# Patient Record
Sex: Female | Born: 1935 | Race: Black or African American | Hispanic: No | Marital: Single | Smoking: Never smoker
Health system: Southern US, Community
[De-identification: ages and names within clinical notes are randomized; demographics above are authoritative.]

## PROBLEM LIST (undated history)

## (undated) DIAGNOSIS — S43499A Other sprain of unspecified shoulder joint, initial encounter: Secondary | ICD-10-CM

## (undated) DIAGNOSIS — J309 Allergic rhinitis, unspecified: Secondary | ICD-10-CM

## (undated) DIAGNOSIS — S46819A Strain of other muscles, fascia and tendons at shoulder and upper arm level, unspecified arm, initial encounter: Secondary | ICD-10-CM

## (undated) DIAGNOSIS — R7309 Other abnormal glucose: Secondary | ICD-10-CM

## (undated) DIAGNOSIS — N812 Incomplete uterovaginal prolapse: Secondary | ICD-10-CM

## (undated) DIAGNOSIS — R609 Edema, unspecified: Secondary | ICD-10-CM

## (undated) DIAGNOSIS — M1710 Unilateral primary osteoarthritis, unspecified knee: Secondary | ICD-10-CM

## (undated) DIAGNOSIS — E7800 Pure hypercholesterolemia, unspecified: Secondary | ICD-10-CM

## (undated) DIAGNOSIS — M79609 Pain in unspecified limb: Secondary | ICD-10-CM

## (undated) DIAGNOSIS — T7840XA Allergy, unspecified, initial encounter: Secondary | ICD-10-CM

## (undated) DIAGNOSIS — E119 Type 2 diabetes mellitus without complications: Secondary | ICD-10-CM

## (undated) DIAGNOSIS — R5383 Other fatigue: Secondary | ICD-10-CM

## (undated) DIAGNOSIS — D010 Carcinoma in situ of colon: Secondary | ICD-10-CM

## (undated) DIAGNOSIS — R5381 Other malaise: Secondary | ICD-10-CM

## (undated) DIAGNOSIS — IMO0002 Reserved for concepts with insufficient information to code with codable children: Secondary | ICD-10-CM

## (undated) DIAGNOSIS — E669 Obesity, unspecified: Secondary | ICD-10-CM

## (undated) DIAGNOSIS — R109 Unspecified abdominal pain: Secondary | ICD-10-CM

## (undated) DIAGNOSIS — K222 Esophageal obstruction: Secondary | ICD-10-CM

## (undated) DIAGNOSIS — M25559 Pain in unspecified hip: Secondary | ICD-10-CM

## (undated) DIAGNOSIS — F411 Generalized anxiety disorder: Secondary | ICD-10-CM

## (undated) DIAGNOSIS — R3129 Other microscopic hematuria: Secondary | ICD-10-CM

## (undated) DIAGNOSIS — G609 Hereditary and idiopathic neuropathy, unspecified: Secondary | ICD-10-CM

## (undated) DIAGNOSIS — R51 Headache: Secondary | ICD-10-CM

## (undated) DIAGNOSIS — R1310 Dysphagia, unspecified: Secondary | ICD-10-CM

## (undated) DIAGNOSIS — M810 Age-related osteoporosis without current pathological fracture: Secondary | ICD-10-CM

## (undated) DIAGNOSIS — K5900 Constipation, unspecified: Secondary | ICD-10-CM

## (undated) DIAGNOSIS — H8110 Benign paroxysmal vertigo, unspecified ear: Secondary | ICD-10-CM

## (undated) DIAGNOSIS — I10 Essential (primary) hypertension: Secondary | ICD-10-CM

## (undated) DIAGNOSIS — J329 Chronic sinusitis, unspecified: Secondary | ICD-10-CM

## (undated) HISTORY — DX: Hereditary and idiopathic neuropathy, unspecified: G60.9

## (undated) HISTORY — DX: Dysphagia, unspecified: R13.10

## (undated) HISTORY — DX: Pain in unspecified hip: M25.559

## (undated) HISTORY — DX: Allergic rhinitis, unspecified: J30.9

## (undated) HISTORY — DX: Type 2 diabetes mellitus without complications: E11.9

## (undated) HISTORY — DX: Essential (primary) hypertension: I10

## (undated) HISTORY — DX: Unilateral primary osteoarthritis, unspecified knee: M17.10

## (undated) HISTORY — DX: Incomplete uterovaginal prolapse: N81.2

## (undated) HISTORY — PX: COLONOSCOPY: SHX174

## (undated) HISTORY — DX: Strain of other muscles, fascia and tendons at shoulder and upper arm level, unspecified arm, initial encounter: S46.819A

## (undated) HISTORY — DX: Generalized anxiety disorder: F41.1

## (undated) HISTORY — DX: Reserved for concepts with insufficient information to code with codable children: IMO0002

## (undated) HISTORY — DX: Carcinoma in situ of colon: D01.0

## (undated) HISTORY — DX: Headache: R51

## (undated) HISTORY — DX: Other malaise: R53.81

## (undated) HISTORY — DX: Unspecified abdominal pain: R10.9

## (undated) HISTORY — DX: Edema, unspecified: R60.9

## (undated) HISTORY — DX: Other sprain of unspecified shoulder joint, initial encounter: S43.499A

## (undated) HISTORY — DX: Age-related osteoporosis without current pathological fracture: M81.0

## (undated) HISTORY — DX: Pure hypercholesterolemia, unspecified: E78.00

## (undated) HISTORY — DX: Constipation, unspecified: K59.00

## (undated) HISTORY — DX: Benign paroxysmal vertigo, unspecified ear: H81.10

## (undated) HISTORY — DX: Other fatigue: R53.83

## (undated) HISTORY — DX: Other microscopic hematuria: R31.29

## (undated) HISTORY — DX: Pain in unspecified limb: M79.609

## (undated) HISTORY — DX: Other abnormal glucose: R73.09

## (undated) HISTORY — DX: Chronic sinusitis, unspecified: J32.9

## (undated) HISTORY — DX: Esophageal obstruction: K22.2

## (undated) HISTORY — DX: Allergy, unspecified, initial encounter: T78.40XA

## (undated) HISTORY — DX: Obesity, unspecified: E66.9

---

## 1999-06-24 ENCOUNTER — Encounter: Admission: RE | Admit: 1999-06-24 | Discharge: 1999-06-24 | Payer: Self-pay | Admitting: Periodontics

## 1999-06-24 ENCOUNTER — Encounter: Payer: Self-pay | Admitting: Unknown Physician Specialty

## 2000-08-10 ENCOUNTER — Encounter: Admission: RE | Admit: 2000-08-10 | Discharge: 2000-08-10 | Payer: Self-pay | Admitting: Unknown Physician Specialty

## 2000-08-10 ENCOUNTER — Encounter: Payer: Self-pay | Admitting: Unknown Physician Specialty

## 2002-11-03 ENCOUNTER — Encounter: Admission: RE | Admit: 2002-11-03 | Discharge: 2002-11-03 | Payer: Self-pay | Admitting: Unknown Physician Specialty

## 2002-11-03 ENCOUNTER — Encounter: Payer: Self-pay | Admitting: Unknown Physician Specialty

## 2003-11-14 ENCOUNTER — Encounter: Admission: RE | Admit: 2003-11-14 | Discharge: 2003-11-14 | Payer: Self-pay | Admitting: Unknown Physician Specialty

## 2004-11-14 ENCOUNTER — Encounter: Admission: RE | Admit: 2004-11-14 | Discharge: 2004-11-14 | Payer: Self-pay | Admitting: Unknown Physician Specialty

## 2005-07-22 ENCOUNTER — Emergency Department (HOSPITAL_COMMUNITY): Admission: EM | Admit: 2005-07-22 | Discharge: 2005-07-22 | Payer: Self-pay | Admitting: Family Medicine

## 2005-11-17 ENCOUNTER — Encounter: Admission: RE | Admit: 2005-11-17 | Discharge: 2005-11-17 | Payer: Self-pay | Admitting: Unknown Physician Specialty

## 2005-12-13 ENCOUNTER — Emergency Department (HOSPITAL_COMMUNITY): Admission: EM | Admit: 2005-12-13 | Discharge: 2005-12-13 | Payer: Self-pay | Admitting: Family Medicine

## 2006-01-10 ENCOUNTER — Emergency Department (HOSPITAL_COMMUNITY): Admission: EM | Admit: 2006-01-10 | Discharge: 2006-01-10 | Payer: Self-pay | Admitting: Family Medicine

## 2006-11-22 ENCOUNTER — Encounter: Admission: RE | Admit: 2006-11-22 | Discharge: 2006-11-22 | Payer: Self-pay | Admitting: Unknown Physician Specialty

## 2007-02-17 ENCOUNTER — Emergency Department (HOSPITAL_COMMUNITY): Admission: EM | Admit: 2007-02-17 | Discharge: 2007-02-17 | Payer: Self-pay | Admitting: Family Medicine

## 2007-03-03 ENCOUNTER — Ambulatory Visit: Payer: Self-pay | Admitting: Nurse Practitioner

## 2007-03-03 DIAGNOSIS — M25559 Pain in unspecified hip: Secondary | ICD-10-CM | POA: Insufficient documentation

## 2007-03-03 DIAGNOSIS — M810 Age-related osteoporosis without current pathological fracture: Secondary | ICD-10-CM

## 2007-03-03 DIAGNOSIS — E1169 Type 2 diabetes mellitus with other specified complication: Secondary | ICD-10-CM | POA: Insufficient documentation

## 2007-03-03 DIAGNOSIS — I10 Essential (primary) hypertension: Secondary | ICD-10-CM

## 2007-03-03 DIAGNOSIS — J309 Allergic rhinitis, unspecified: Secondary | ICD-10-CM

## 2007-03-03 DIAGNOSIS — F411 Generalized anxiety disorder: Secondary | ICD-10-CM | POA: Insufficient documentation

## 2007-03-03 DIAGNOSIS — E7800 Pure hypercholesterolemia, unspecified: Secondary | ICD-10-CM

## 2007-03-03 DIAGNOSIS — I152 Hypertension secondary to endocrine disorders: Secondary | ICD-10-CM | POA: Insufficient documentation

## 2007-03-03 HISTORY — DX: Essential (primary) hypertension: I10

## 2007-03-03 LAB — CONVERTED CEMR LAB
Cholesterol, target level: 200 mg/dL
HDL goal, serum: 40 mg/dL
LDL Goal: 130 mg/dL

## 2007-03-04 LAB — CONVERTED CEMR LAB
ALT: 30 units/L (ref 0–35)
AST: 30 units/L (ref 0–37)
Albumin: 4.6 g/dL (ref 3.5–5.2)
Basophils Absolute: 0.1 10*3/uL (ref 0.0–0.1)
Basophils Relative: 1 % (ref 0–1)
Calcium: 10.3 mg/dL (ref 8.4–10.5)
Chloride: 101 meq/L (ref 96–112)
Lymphocytes Relative: 45 % (ref 12–46)
MCHC: 32.8 g/dL (ref 30.0–36.0)
Neutro Abs: 3 10*3/uL (ref 1.7–7.7)
Neutrophils Relative %: 47 % (ref 43–77)
Potassium: 3.7 meq/L (ref 3.5–5.3)
RDW: 14.8 % (ref 11.5–15.5)
Sodium: 140 meq/L (ref 135–145)
TSH: 1.425 microintl units/mL (ref 0.350–5.50)
Total CHOL/HDL Ratio: 2.3
Total Protein: 7.9 g/dL (ref 6.0–8.3)

## 2007-03-07 ENCOUNTER — Encounter (INDEPENDENT_AMBULATORY_CARE_PROVIDER_SITE_OTHER): Payer: Self-pay | Admitting: Nurse Practitioner

## 2007-03-10 ENCOUNTER — Telehealth (INDEPENDENT_AMBULATORY_CARE_PROVIDER_SITE_OTHER): Payer: Self-pay | Admitting: Nurse Practitioner

## 2007-03-24 ENCOUNTER — Other Ambulatory Visit: Admission: RE | Admit: 2007-03-24 | Discharge: 2007-03-24 | Payer: Self-pay | Admitting: Internal Medicine

## 2007-03-24 ENCOUNTER — Ambulatory Visit: Payer: Self-pay | Admitting: Nurse Practitioner

## 2007-03-24 DIAGNOSIS — R519 Headache, unspecified: Secondary | ICD-10-CM | POA: Insufficient documentation

## 2007-03-24 DIAGNOSIS — R51 Headache: Secondary | ICD-10-CM

## 2007-03-24 LAB — CONVERTED CEMR LAB: Pap Smear: NORMAL

## 2007-03-25 ENCOUNTER — Encounter (INDEPENDENT_AMBULATORY_CARE_PROVIDER_SITE_OTHER): Payer: Self-pay | Admitting: Nurse Practitioner

## 2007-03-30 ENCOUNTER — Encounter (INDEPENDENT_AMBULATORY_CARE_PROVIDER_SITE_OTHER): Payer: Self-pay | Admitting: Nurse Practitioner

## 2007-04-12 ENCOUNTER — Encounter (INDEPENDENT_AMBULATORY_CARE_PROVIDER_SITE_OTHER): Payer: Self-pay | Admitting: Nurse Practitioner

## 2007-04-25 ENCOUNTER — Ambulatory Visit: Payer: Self-pay | Admitting: Nurse Practitioner

## 2007-04-25 DIAGNOSIS — H8110 Benign paroxysmal vertigo, unspecified ear: Secondary | ICD-10-CM

## 2007-05-02 ENCOUNTER — Encounter (INDEPENDENT_AMBULATORY_CARE_PROVIDER_SITE_OTHER): Payer: Self-pay | Admitting: Nurse Practitioner

## 2007-05-04 ENCOUNTER — Encounter (INDEPENDENT_AMBULATORY_CARE_PROVIDER_SITE_OTHER): Payer: Self-pay | Admitting: Nurse Practitioner

## 2007-06-08 ENCOUNTER — Ambulatory Visit: Payer: Self-pay | Admitting: Nurse Practitioner

## 2007-06-08 DIAGNOSIS — S46819A Strain of other muscles, fascia and tendons at shoulder and upper arm level, unspecified arm, initial encounter: Secondary | ICD-10-CM

## 2007-06-08 DIAGNOSIS — R109 Unspecified abdominal pain: Secondary | ICD-10-CM | POA: Insufficient documentation

## 2007-06-08 DIAGNOSIS — S43499A Other sprain of unspecified shoulder joint, initial encounter: Secondary | ICD-10-CM | POA: Insufficient documentation

## 2007-06-08 LAB — CONVERTED CEMR LAB
Nitrite: NEGATIVE
Protein, U semiquant: NEGATIVE
Urobilinogen, UA: 1
pH: 7

## 2007-06-09 ENCOUNTER — Encounter (INDEPENDENT_AMBULATORY_CARE_PROVIDER_SITE_OTHER): Payer: Self-pay | Admitting: Nurse Practitioner

## 2007-06-23 ENCOUNTER — Telehealth (INDEPENDENT_AMBULATORY_CARE_PROVIDER_SITE_OTHER): Payer: Self-pay | Admitting: Nurse Practitioner

## 2007-08-16 ENCOUNTER — Encounter (INDEPENDENT_AMBULATORY_CARE_PROVIDER_SITE_OTHER): Payer: Self-pay | Admitting: Nurse Practitioner

## 2007-08-31 ENCOUNTER — Ambulatory Visit: Payer: Self-pay | Admitting: Nurse Practitioner

## 2007-08-31 DIAGNOSIS — R609 Edema, unspecified: Secondary | ICD-10-CM | POA: Insufficient documentation

## 2007-09-01 ENCOUNTER — Telehealth (INDEPENDENT_AMBULATORY_CARE_PROVIDER_SITE_OTHER): Payer: Self-pay | Admitting: Nurse Practitioner

## 2007-09-15 ENCOUNTER — Encounter (INDEPENDENT_AMBULATORY_CARE_PROVIDER_SITE_OTHER): Payer: Self-pay | Admitting: Internal Medicine

## 2007-09-28 ENCOUNTER — Ambulatory Visit: Payer: Self-pay | Admitting: Nurse Practitioner

## 2007-09-28 DIAGNOSIS — IMO0002 Reserved for concepts with insufficient information to code with codable children: Secondary | ICD-10-CM | POA: Insufficient documentation

## 2007-09-28 DIAGNOSIS — M1710 Unilateral primary osteoarthritis, unspecified knee: Secondary | ICD-10-CM

## 2007-11-21 ENCOUNTER — Ambulatory Visit: Payer: Self-pay | Admitting: Nurse Practitioner

## 2007-11-21 LAB — CONVERTED CEMR LAB
AST: 21 units/L (ref 0–37)
Albumin: 4.8 g/dL (ref 3.5–5.2)
Alkaline Phosphatase: 93 units/L (ref 39–117)
Basophils Absolute: 0.1 10*3/uL (ref 0.0–0.1)
Basophils Relative: 1 % (ref 0–1)
Eosinophils Absolute: 0.2 10*3/uL (ref 0.0–0.7)
LDL Cholesterol: 83 mg/dL (ref 0–99)
MCHC: 33.2 g/dL (ref 30.0–36.0)
MCV: 84.4 fL (ref 78.0–100.0)
Neutrophils Relative %: 51 % (ref 43–77)
Platelets: 345 10*3/uL (ref 150–400)
Potassium: 3.6 meq/L (ref 3.5–5.3)
Sodium: 136 meq/L (ref 135–145)
Total Protein: 8.2 g/dL (ref 6.0–8.3)
WBC: 7.9 10*3/uL (ref 4.0–10.5)

## 2007-11-22 ENCOUNTER — Encounter (INDEPENDENT_AMBULATORY_CARE_PROVIDER_SITE_OTHER): Payer: Self-pay | Admitting: Nurse Practitioner

## 2007-12-05 ENCOUNTER — Encounter: Admission: RE | Admit: 2007-12-05 | Discharge: 2007-12-05 | Payer: Self-pay | Admitting: Internal Medicine

## 2008-01-09 ENCOUNTER — Telehealth (INDEPENDENT_AMBULATORY_CARE_PROVIDER_SITE_OTHER): Payer: Self-pay | Admitting: Nurse Practitioner

## 2008-01-10 ENCOUNTER — Ambulatory Visit: Payer: Self-pay | Admitting: Nurse Practitioner

## 2008-02-23 ENCOUNTER — Ambulatory Visit: Payer: Self-pay | Admitting: Nurse Practitioner

## 2008-02-23 DIAGNOSIS — J329 Chronic sinusitis, unspecified: Secondary | ICD-10-CM

## 2008-02-27 ENCOUNTER — Ambulatory Visit (HOSPITAL_COMMUNITY): Admission: RE | Admit: 2008-02-27 | Discharge: 2008-02-27 | Payer: Self-pay | Admitting: Internal Medicine

## 2008-02-27 ENCOUNTER — Telehealth (INDEPENDENT_AMBULATORY_CARE_PROVIDER_SITE_OTHER): Payer: Self-pay | Admitting: Nurse Practitioner

## 2008-05-23 ENCOUNTER — Ambulatory Visit: Payer: Self-pay | Admitting: Nurse Practitioner

## 2008-05-23 LAB — CONVERTED CEMR LAB: LDL Goal: 160 mg/dL

## 2008-06-04 ENCOUNTER — Telehealth (INDEPENDENT_AMBULATORY_CARE_PROVIDER_SITE_OTHER): Payer: Self-pay | Admitting: Nurse Practitioner

## 2008-06-22 ENCOUNTER — Encounter (INDEPENDENT_AMBULATORY_CARE_PROVIDER_SITE_OTHER): Payer: Self-pay | Admitting: Nurse Practitioner

## 2008-09-25 ENCOUNTER — Ambulatory Visit: Payer: Self-pay | Admitting: Nurse Practitioner

## 2008-09-25 DIAGNOSIS — K5900 Constipation, unspecified: Secondary | ICD-10-CM | POA: Insufficient documentation

## 2008-09-25 LAB — CONVERTED CEMR LAB
ALT: 16 units/L (ref 0–35)
AST: 18 units/L (ref 0–37)
Albumin: 4.4 g/dL (ref 3.5–5.2)
Alkaline Phosphatase: 85 units/L (ref 39–117)
Basophils Absolute: 0.1 10*3/uL (ref 0.0–0.1)
Glucose, Bld: 104 mg/dL — ABNORMAL HIGH (ref 70–99)
Hemoglobin: 12.2 g/dL (ref 12.0–15.0)
LDL Cholesterol: 75 mg/dL (ref 0–99)
Lymphocytes Relative: 37 % (ref 12–46)
Microalb, Ur: 1.12 mg/dL (ref 0.00–1.89)
Monocytes Absolute: 0.6 10*3/uL (ref 0.1–1.0)
Monocytes Relative: 7 % (ref 3–12)
Neutro Abs: 4.6 10*3/uL (ref 1.7–7.7)
Neutrophils Relative %: 54 % (ref 43–77)
Potassium: 3.6 meq/L (ref 3.5–5.3)
RBC: 4.39 M/uL (ref 3.87–5.11)
RDW: 15.5 % (ref 11.5–15.5)
Sodium: 135 meq/L (ref 135–145)
TSH: 2.216 microintl units/mL (ref 0.350–4.500)
Total Protein: 7.8 g/dL (ref 6.0–8.3)
Triglycerides: 103 mg/dL (ref <150)
VLDL: 21 mg/dL (ref 0–40)

## 2008-09-27 ENCOUNTER — Encounter (INDEPENDENT_AMBULATORY_CARE_PROVIDER_SITE_OTHER): Payer: Self-pay | Admitting: Nurse Practitioner

## 2008-12-05 ENCOUNTER — Encounter: Admission: RE | Admit: 2008-12-05 | Discharge: 2008-12-05 | Payer: Self-pay | Admitting: Internal Medicine

## 2008-12-28 ENCOUNTER — Ambulatory Visit: Payer: Self-pay | Admitting: Nurse Practitioner

## 2009-01-02 ENCOUNTER — Encounter (INDEPENDENT_AMBULATORY_CARE_PROVIDER_SITE_OTHER): Payer: Self-pay | Admitting: Nurse Practitioner

## 2009-01-02 ENCOUNTER — Ambulatory Visit (HOSPITAL_COMMUNITY): Admission: RE | Admit: 2009-01-02 | Discharge: 2009-01-02 | Payer: Self-pay | Admitting: Internal Medicine

## 2009-01-15 ENCOUNTER — Encounter (INDEPENDENT_AMBULATORY_CARE_PROVIDER_SITE_OTHER): Payer: Self-pay | Admitting: Nurse Practitioner

## 2009-03-28 ENCOUNTER — Ambulatory Visit: Payer: Self-pay | Admitting: Nurse Practitioner

## 2009-03-28 DIAGNOSIS — M79609 Pain in unspecified limb: Secondary | ICD-10-CM | POA: Insufficient documentation

## 2009-03-28 DIAGNOSIS — R5383 Other fatigue: Secondary | ICD-10-CM | POA: Insufficient documentation

## 2009-03-29 ENCOUNTER — Encounter (INDEPENDENT_AMBULATORY_CARE_PROVIDER_SITE_OTHER): Payer: Self-pay | Admitting: Nurse Practitioner

## 2009-03-29 LAB — CONVERTED CEMR LAB
ALT: 24 units/L (ref 0–35)
AST: 21 units/L (ref 0–37)
Albumin: 4.7 g/dL (ref 3.5–5.2)
Alkaline Phosphatase: 90 units/L (ref 39–117)
LDL Cholesterol: 64 mg/dL (ref 0–99)
Potassium: 3.9 meq/L (ref 3.5–5.3)
Sodium: 134 meq/L — ABNORMAL LOW (ref 135–145)
Total Bilirubin: 0.4 mg/dL (ref 0.3–1.2)
Total Protein: 8.1 g/dL (ref 6.0–8.3)
VLDL: 17 mg/dL (ref 0–40)

## 2009-04-02 ENCOUNTER — Encounter (INDEPENDENT_AMBULATORY_CARE_PROVIDER_SITE_OTHER): Payer: Self-pay | Admitting: Nurse Practitioner

## 2009-04-23 ENCOUNTER — Encounter (INDEPENDENT_AMBULATORY_CARE_PROVIDER_SITE_OTHER): Payer: Self-pay | Admitting: Nurse Practitioner

## 2009-05-21 ENCOUNTER — Encounter (INDEPENDENT_AMBULATORY_CARE_PROVIDER_SITE_OTHER): Payer: Self-pay | Admitting: Nurse Practitioner

## 2009-06-12 ENCOUNTER — Encounter (INDEPENDENT_AMBULATORY_CARE_PROVIDER_SITE_OTHER): Payer: Self-pay | Admitting: Nurse Practitioner

## 2009-07-29 ENCOUNTER — Ambulatory Visit: Payer: Self-pay | Admitting: Nurse Practitioner

## 2009-07-29 DIAGNOSIS — R3129 Other microscopic hematuria: Secondary | ICD-10-CM

## 2009-07-29 LAB — CONVERTED CEMR LAB
Glucose, Urine, Semiquant: NEGATIVE
Ketones, urine, test strip: NEGATIVE
Specific Gravity, Urine: 1.01
pH: 8

## 2009-07-30 ENCOUNTER — Encounter (INDEPENDENT_AMBULATORY_CARE_PROVIDER_SITE_OTHER): Payer: Self-pay | Admitting: Nurse Practitioner

## 2009-07-30 LAB — CONVERTED CEMR LAB: Microalb, Ur: 1.03 mg/dL (ref 0.00–1.89)

## 2009-08-30 ENCOUNTER — Encounter (INDEPENDENT_AMBULATORY_CARE_PROVIDER_SITE_OTHER): Payer: Self-pay | Admitting: Nurse Practitioner

## 2009-11-29 ENCOUNTER — Encounter (INDEPENDENT_AMBULATORY_CARE_PROVIDER_SITE_OTHER): Payer: Self-pay | Admitting: Nurse Practitioner

## 2009-12-06 ENCOUNTER — Encounter: Admission: RE | Admit: 2009-12-06 | Discharge: 2009-12-06 | Payer: Self-pay | Admitting: Internal Medicine

## 2009-12-09 ENCOUNTER — Ambulatory Visit: Payer: Self-pay | Admitting: Nurse Practitioner

## 2009-12-09 DIAGNOSIS — E669 Obesity, unspecified: Secondary | ICD-10-CM | POA: Insufficient documentation

## 2009-12-09 DIAGNOSIS — R1310 Dysphagia, unspecified: Secondary | ICD-10-CM | POA: Insufficient documentation

## 2009-12-10 ENCOUNTER — Encounter (INDEPENDENT_AMBULATORY_CARE_PROVIDER_SITE_OTHER): Payer: Self-pay | Admitting: Nurse Practitioner

## 2009-12-11 ENCOUNTER — Ambulatory Visit (HOSPITAL_COMMUNITY): Admission: RE | Admit: 2009-12-11 | Discharge: 2009-12-11 | Payer: Self-pay | Admitting: Internal Medicine

## 2009-12-11 ENCOUNTER — Telehealth (INDEPENDENT_AMBULATORY_CARE_PROVIDER_SITE_OTHER): Payer: Self-pay | Admitting: Nurse Practitioner

## 2009-12-11 DIAGNOSIS — K222 Esophageal obstruction: Secondary | ICD-10-CM | POA: Insufficient documentation

## 2009-12-24 ENCOUNTER — Encounter (INDEPENDENT_AMBULATORY_CARE_PROVIDER_SITE_OTHER): Payer: Self-pay | Admitting: *Deleted

## 2010-01-15 ENCOUNTER — Encounter (INDEPENDENT_AMBULATORY_CARE_PROVIDER_SITE_OTHER): Payer: Self-pay | Admitting: Nurse Practitioner

## 2010-01-31 ENCOUNTER — Encounter (INDEPENDENT_AMBULATORY_CARE_PROVIDER_SITE_OTHER): Payer: Self-pay | Admitting: Nurse Practitioner

## 2010-01-31 ENCOUNTER — Telehealth (INDEPENDENT_AMBULATORY_CARE_PROVIDER_SITE_OTHER): Payer: Self-pay | Admitting: *Deleted

## 2010-02-04 ENCOUNTER — Ambulatory Visit: Admit: 2010-02-04 | Payer: Self-pay | Admitting: Gastroenterology

## 2010-02-06 ENCOUNTER — Ambulatory Visit
Admission: RE | Admit: 2010-02-06 | Discharge: 2010-02-06 | Payer: Self-pay | Source: Home / Self Care | Attending: Nurse Practitioner | Admitting: Nurse Practitioner

## 2010-02-06 DIAGNOSIS — R7309 Other abnormal glucose: Secondary | ICD-10-CM | POA: Insufficient documentation

## 2010-02-16 LAB — CONVERTED CEMR LAB
Basophils Absolute: 0.1 10*3/uL (ref 0.0–0.1)
Basophils Relative: 1 % (ref 0–1)
Bilirubin Urine: NEGATIVE
CO2: 28 meq/L (ref 19–32)
Chloride: 100 meq/L (ref 96–112)
Glucose, Urine, Semiquant: NEGATIVE
Hemoglobin: 12.8 g/dL (ref 12.0–15.0)
Ketones, urine, test strip: NEGATIVE
Lymphocytes Relative: 38 % (ref 12–46)
MCHC: 32.5 g/dL (ref 30.0–36.0)
Monocytes Absolute: 0.5 10*3/uL (ref 0.1–1.0)
Neutro Abs: 4.3 10*3/uL (ref 1.7–7.7)
Neutrophils Relative %: 55 % (ref 43–77)
Nitrite: NEGATIVE
Potassium: 4.1 meq/L (ref 3.5–5.3)
Protein, U semiquant: NEGATIVE
RBC: 4.4 M/uL (ref 3.87–5.11)
RDW: 16.3 % — ABNORMAL HIGH (ref 11.5–15.5)
Specific Gravity, Urine: 1.01
Specific Gravity, Urine: 1.01
Urobilinogen, UA: 0.2
WBC Urine, dipstick: NEGATIVE
pH: 8

## 2010-02-18 NOTE — Letter (Signed)
Summary: [place]/NEW PT EVAL  [place]/NEW PT EVAL   Imported By: Arta Bruce 06/12/2009 11:33:12  _____________________________________________________________________  External Attachment:    Type:   Image     Comment:   External Document

## 2010-02-18 NOTE — Letter (Signed)
Summary: Casey [name]//OFFICE NOTE  Casey [name]//OFFICE NOTE   Imported By: Arta Bruce 06/12/2009 15:30:01  _____________________________________________________________________  External Attachment:    Type:   Image     Comment:   External Document

## 2010-02-18 NOTE — Letter (Signed)
Summary: [facility]   Imported By: Arta Bruce 06/20/2009 14:27:15  _____________________________________________________________________  External Attachment:    Type:   Image     Comment:   External Document

## 2010-02-18 NOTE — Assessment & Plan Note (Signed)
Summary: HTN   Vital Signs:  Patient profile:   75 year old female Height:      61.5 inches Weight:      194 pounds BMI:     36.19 Temp:     98.1 degrees F Pulse rate:   88 / minute Pulse rhythm:   regular Resp:     18 per minute BP sitting:   138 / 79  (left arm) Cuff size:   large  Vitals Entered By: [place] Shannon (July 29, 2009 8:43 AM) CC: four month f/u... pt says she doesn;t see any differences in her allergies..., Hypertension Management, Lipid Management, Depression Is Patient Diabetic? No Pain Assessment Patient in pain? no       Does patient need assistance? Functional Status Self care Ambulation Normal   Primary Care Provider:  Lehman Prom FNP  CC:  four month f/u... pt says she doesn;t see any differences in her allergies..., Hypertension Management, Lipid Management, and Depression.  History of Present Illness:  Pt into the office for 4 month f/u on htn. Pt has been doing relatively well except she still has problems with her allergies. She has been to the allergiest as ordered and meds were changed but she has seen little results She still wakes in the morning with headache (frontal), nasal congestion, and right ear pain.  Obesity - down 6 pounds since the last visit She has been trying to decrease her weight and is watching her food intake. Psychosocial stress factors include the recent death of a loved one.  The patient denies that she feels like life is not worth living, denies that she wishes that she were dead, and denies that she has thought about ending her life.        Comments:  brother died in 05-26-22 - she has 8 remaining siblings.  Depression Treatment History:  Prior Medication Used:   Start Date: Assessment of Effect:   Comments:  Zoloft (sertraline)     --     no improvement       diarrhea lexapro     --     some improvement     continue  Hypertension History:      She complains of headache, but denies chest pain and palpitations.   She notes no problems with any antihypertensive medication side effects.        Positive major cardiovascular risk factors include female age 73 years old or older, hyperlipidemia, and hypertension.  Negative major cardiovascular risk factors include no history of diabetes, negative family history for ischemic heart disease, and non-tobacco-user status.        Further assessment for target organ damage reveals no history of ASHD, cardiac end-organ damage (CHF/LVH), stroke/TIA, peripheral vascular disease, renal insufficiency, or hypertensive retinopathy.    Lipid Management History:      Positive NCEP/ATP III risk factors include female age 73 years old or older and hypertension.  Negative NCEP/ATP III risk factors include no history of early menopause without estrogen hormone replacement, non-diabetic, HDL cholesterol greater than 60, no family history for ischemic heart disease, non-tobacco-user status, no ASHD (atherosclerotic heart disease), no prior stroke/TIA, no peripheral vascular disease, and no history of aortic aneurysm.        The patient states that she knows about the "Therapeutic Lifestyle Change" diet.  Her compliance with the TLC diet is good.  The patient does not know about adjunctive measures for cholesterol lowering.  She expresses no side effects from her lipid-lowering  medication.  The patient denies any symptoms to suggest myopathy or liver disease.       Current Medications (verified): 1)  Folic Acid 1 Mg  Tabs (Folic Acid) .... Take 1 Tablet By Mouth Once Daily 2)  Triamterene-Hctz 75-50 Mg  Tabs (Triamterene-Hctz) .... One Tablet By Mouth Daily For Blood Pressure 3)  Simvastatin 80 Mg  Tabs (Simvastatin) .... One Tablet By Mouth Nightly For Cholesterol 4)  Alprazolam 0.5 Mg  Tabs (Alprazolam) .... Take 1 Tablet By Mouth Daily If Needed For Stress 5)  Fosamax 70 Mg  Tabs (Alendronate Sodium) .Marland Kitchen.. 1 Tablet By Mouth Weekly For Bones 6)  Lexapro 5 Mg Tabs (Escitalopram  Oxalate) .Marland Kitchen.. 1 Tablet By Mouth Daily For Nerves 7)  Norvasc 5 Mg  Tabs (Amlodipine Besylate) .Marland Kitchen.. 1 Tablet By Mouth Daily For Blood Pressure 8)  Omnaris 50 Mcg/act Susp (Ciclesonide) .... Rx By Allergist 9)  Singulair 10 Mg Tabs (Montelukast Sodium) .Marland Kitchen.. 1 Tablet By Mouth Nightly For Allergies 10)  Allegra 180 Mg Tabs (Fexofenadine Hcl) .... One Tablet By Mouth Daily 11)  Arthrotec 75-200 Mg-Mcg Tabs (Diclofenac-Misoprostol) .... One Tablet By Mouth Two Times A Day As Needed For Arthritis 12)  Astepro 0.15 % Soln (Azelastine Hcl) .... Rx By Allergist  Allergies (verified): No Known Drug Allergies  Review of Systems General:  Denies fever. ENT:  Complains of earache and nasal congestion; right ear pain. CV:  Denies chest pain or discomfort and swelling of feet. Resp:  Denies cough. GI:  Complains of abdominal pain and diarrhea; denies vomiting; x 1 week ago but symptoms have improved for the most part.  Physical Exam  General:  alert.   Head:  normocephalic.   Eyes:  glasses Mouth:  poor dentation Lungs:  normal breath sounds.   Heart:  normal rate and regular rhythm.   Msk:  up to the exam table Extremities:  no edema Neurologic:  alert & oriented X3.     Impression & Recommendations:  Problem # 1:  HYPERTENSION, BENIGN ESSENTIAL (ICD-401.1)  Bp is doing good  continue current meds Her updated medication list for this problem includes:    Triamterene-hctz 75-50 Mg Tabs (Triamterene-hctz) ..... One tablet by mouth daily for blood pressure    Norvasc 5 Mg Tabs (Amlodipine besylate) .Marland Kitchen... 1 tablet by mouth daily for blood pressure  Orders: UA Dipstick w/o Micro (manual) (16109) T-Urine Microalbumin w/creat. ratio [phone])  Problem # 2:  ALLERGIC RHINITIS (ICD-477.9) symptoms ongoing pt advised to reschedule an appt with the allergist since her symptoms continue Her updated medication list for this problem includes:    Omnaris 50 Mcg/act Susp (Ciclesonide)  .Marland Kitchen... Rx by allergist    Allegra 180 Mg Tabs (Fexofenadine hcl) ..... One tablet by mouth daily    Astepro 0.15 % Soln (Azelastine hcl) .Marland Kitchen... Rx by allergist  Problem # 3:  ANXIETY (ICD-300.00) increased some since her brother recently died will monitor Her updated medication list for this problem includes:    Alprazolam 0.5 Mg Tabs (Alprazolam) .Marland Kitchen... Take 1 tablet by mouth daily if needed for stress    Lexapro 5 Mg Tabs (Escitalopram oxalate) .Marland Kitchen... 1 tablet by mouth daily for nerves  Problem # 4:  OSTEOPOROSIS (ICD-733.00) continue current meds Her updated medication list for this problem includes    Fosamax 70 Mg Tabs (Alendronate sodium) .Marland Kitchen... 1 tablet by mouth weekly for bones  Problem # 5:  HYPERCHOLESTEROLEMIA (ICD-272.0) will continue current meds Her updated medication list for this problem  includes:    Simvastatin 80 Mg Tabs (Simvastatin) ..... One tablet by mouth nightly for cholesterol  Problem # 6:  MICROSCOPIC HEMATURIA (ICD-599.72)  Orders: T-Culture, Urine ([id number])  Complete Medication List: 1)  Folic Acid 1 Mg Tabs (Folic acid) .... Take 1 tablet by mouth once daily 2)  Triamterene-hctz 75-50 Mg Tabs (Triamterene-hctz) .... One tablet by mouth daily for blood pressure 3)  Simvastatin 80 Mg Tabs (Simvastatin) .... One tablet by mouth nightly for cholesterol 4)  Alprazolam 0.5 Mg Tabs (Alprazolam) .... Take 1 tablet by mouth daily if needed for stress 5)  Fosamax 70 Mg Tabs (Alendronate sodium) .Marland Kitchen.. 1 tablet by mouth weekly for bones 6)  Lexapro 5 Mg Tabs (Escitalopram oxalate) .Marland Kitchen.. 1 tablet by mouth daily for nerves 7)  Norvasc 5 Mg Tabs (Amlodipine besylate) .Marland Kitchen.. 1 tablet by mouth daily for blood pressure 8)  Omnaris 50 Mcg/act Susp (Ciclesonide) .... Rx by allergist 9)  Singulair 10 Mg Tabs (Montelukast sodium) .Marland Kitchen.. 1 tablet by mouth nightly for allergies 10)  Allegra 180 Mg Tabs (Fexofenadine hcl) .... One tablet by mouth daily 11)  Arthrotec 75-200 Mg-mcg  Tabs (Diclofenac-misoprostol) .... One tablet by mouth two times a day as needed for arthritis 12)  Astepro 0.15 % Soln (Azelastine hcl) .... Rx by allergist 13)  Zostavax 14782 Unt/0.24ml Solr (Zoster vaccine live) .... One injection subcutaneously x 1 dose  Hypertension Assessment/Plan:      The patient's hypertensive risk group is category B: At least one risk factor (excluding diabetes) with no target organ damage.  Her calculated 10 year risk of coronary heart disease is 5 %.  Today's blood pressure is 138/79.  Her blood pressure goal is < 140/90.  Lipid Assessment/Plan:      Based on NCEP/ATP III, the patient's risk factor category is "0-1 risk factors".  The patient's lipid goals are as follows: Total cholesterol goal is 200; LDL cholesterol goal is 160; HDL cholesterol goal is 40; Triglyceride goal is 150.  Her LDL cholesterol goal has been met.    Patient Instructions: 1)  Call to make a follow up appointment with the allergist - let them know that you are still having ongoing problems with your allergies. 2)  Blood pressure - doing well.  Refills have been sent to the pharmacy 3)  Contnue current medications 4)  You have been given a prescription for the shingles vaccine. Take to the pharmacy and see how much it will be.  If affordable you can get and bring back to the office for administration.  you will need to schedule a lab visit when you get the medication 5)  Follow up in 4 months for a complete physical exam. 6)  Do not eat after midnight before this visit.  you can drink water and take your medications.   7)  You will need flu vaccine Prescriptions: ZOSTAVAX 95621 UNT/0.65ML SOLR (ZOSTER VACCINE LIVE) One injection subcutaneously x 1 dose  #1 x 0   Entered and Authorized by:   Lehman Prom FNP   Signed by:   Lehman Prom FNP on 07/29/2009   Method used:   Print then Give to Patient   RxID:   [id number] ALPRAZOLAM 0.5 MG  TABS (ALPRAZOLAM) take 1 tablet by mouth  daily if needed for stress  #30 x 1   Entered and Authorized by:   Lehman Prom FNP   Signed by:   Lehman Prom FNP on 07/29/2009   Method used:   Print then  Give to Patient   RxID:   [id number] TRIAMTERENE-HCTZ 75-50 MG  TABS (TRIAMTERENE-HCTZ) One tablet by mouth daily for blood pressure  #30 Tablet x 5   Entered and Authorized by:   Lehman Prom FNP   Signed by:   Lehman Prom FNP on 07/29/2009   Method used:   Electronically to        [facility] [phone]* (retail)       [address]       [place], [place]  [address]       Ph: [phone]       Fax: [phone]   RxID:   [id number]   Laboratory Results   Urine Tests  Date/Time Received: July 29, 2009 10:15 AM   Routine Urinalysis   Color: lt. yellow Glucose: negative   (Normal Range: Negative) Bilirubin: negative   (Normal Range: Negative) Ketone: negative   (Normal Range: Negative) Spec. Gravity: 1.010   (Normal Range: 1.003-1.035) Blood: trace-lysed   (Normal Range: Negative) pH: 8.0   (Normal Range: 5.0-8.0) Protein: negative   (Normal Range: Negative) Urobilinogen: 0.2   (Normal Range: 0-1) Nitrite: negative   (Normal Range: Negative) Leukocyte Esterace: negative   (Normal Range: Negative)

## 2010-02-18 NOTE — Letter (Signed)
Summary: [facility]   Imported By: Arta Bruce 09/12/2009 13:02:06  _____________________________________________________________________  External Attachment:    Type:   Image     Comment:   External Document

## 2010-02-18 NOTE — Letter (Signed)
Summary: TEST ORDER FORM//DEXA//OSTEOPOROSIS  TEST ORDER FORM//DEXA//OSTEOPOROSIS   Imported By: Arta Bruce 02/11/2009 12:29:48  _____________________________________________________________________  External Attachment:    Type:   Image     Comment:   External Document

## 2010-02-18 NOTE — Letter (Signed)
Summary: Handout Printed  Printed Handout:  - Prolapse 

## 2010-02-18 NOTE — Miscellaneous (Signed)
Summary: [facility]FLU [facility]MMUNIZ[facility]  [facility]   Imported By: Arta Bruce 12/09/2009 13:43:32  _____________________________________________________________________  External Attachment:    Type:   Image     Comment:   External Document

## 2010-02-18 NOTE — Letter (Signed)
Summary: New Patient letter  [facility]  [address] [place], [place] [address]   Phone: [phone]  Fax: [phone]       12/24/2009 MRN: [id number]  Casey [name] [address] [place], [place]  [address]  Dear [name],  Welcome to the Gastroenterology Division at [facility].    You are scheduled to see Dr.  Christella Hartigan on 02-04-10 at 10:30a.m. on the 3rd floor at [facility], [address]  We ask that you try to arrive at our office 15 minutes prior to your appointment time to allow for check-in.  We would like you to complete the enclosed self-administered evaluation form prior to your visit and bring it with you on the day of your appointment.  We will review it with you.  Also, please bring a complete list of all your medications or, if you prefer, bring the medication bottles and we will list them.  Please bring your insurance card so that we may make a copy of it.  If your insurance requires a referral to see a specialist, please bring your referral form from your primary care physician.  Co-payments are due at the time of your visit and may be paid by cash, check or credit card.     Your office visit will consist of a consult with your physician (includes a physical exam), any laboratory testing he/she may order, scheduling of any necessary diagnostic testing (e.g. x-ray, ultrasound, CT-scan), and scheduling of a procedure (e.g. Endoscopy, Colonoscopy) if required.  Please allow enough time on your schedule to allow for any/all of these possibilities.    If you cannot keep your appointment, please call [phone] to cancel or reschedule prior to your appointment date.  This allows [name] the opportunity to schedule an appointment for another patient in need of care.  If you do not cancel or reschedule by 5 p.m. the business day prior to your appointment date, you will be charged a $50.00 late cancellation/no-show fee.    Thank you for choosing  [facility] for your medical needs.  We appreciate the opportunity to care for you.  Please visit [name] at our website  to learn more about our practice.                     Sincerely,                                                             The Gastroenterology Division

## 2010-02-18 NOTE — Letter (Signed)
Summary: *HSN Results Follow up  [facility]  [address] [place], [place] [address]   Phone: [phone]  Fax: [phone]      03/29/2009   Callan B Toutant [address] [place], [place]  [address]   Dear  Ms. Gera Diebold,                            ____S.Drinkard,FNP   ____D. Gore,FNP       ____B. McPherson,MD   ____V. Rankins,MD    ____E. Mulberry,MD    _X___N. Daphine Deutscher, FNP  ____D. Reche Dixon, MD    ____K. Philipp Deputy, MD    ____Other     This letter is to inform you that your recent test(s):  _______Pap Smear    ___X____Lab Test     _______X-ray    ___X____ is within acceptable limits  _______ requires a medication change  _______ requires a follow-up lab visit  _______ requires a follow-up visit with your provider   Comments: Labs done during recent office visit shows that your sodium is a little low.  This is likely due to the diuretic that you take for your blood pressure.       _________________________________________________________ If you have any questions, please contact our office [phone].                    Sincerely,    Lehman Prom FNP [facility]

## 2010-02-18 NOTE — Assessment & Plan Note (Signed)
Summary: HTN/Allergic Rhintis   Vital Signs:  Patient profile:   75 year old female Height:      61.5 inches Weight:      200 pounds BMI:     37.31 Temp:     98.0 degrees F oral Pulse rate:   92 / minute Pulse rhythm:   regular Resp:     18 per minute BP sitting:   134 / 79  (left arm) Cuff size:   large  Vitals Entered By: Vesta Mixer CMA (March 28, 2009 8:52 AM) CC: THREE MONTH F/U..... PT SAYS WHEN SHE GETS UP IN THE MORNING SHE IS CONGESTED WITH ALLEGRIES. PT SAYS HER EARS ARE CLOGGED... PT SAYS SHE WOULD LIKE TO HAVE ARTHRITIS MED FOR HER RIGHT SHOULDER AND ARM, Lipid Management, Hypertension Management Is Patient Diabetic? No Pain Assessment Patient in pain? no       Does patient need assistance? Functional Status Self care Ambulation Normal   Primary Care Provider:  Lehman Prom FNP  CC:  THREE MONTH F/U..... PT SAYS WHEN SHE GETS UP IN THE MORNING SHE IS CONGESTED WITH ALLEGRIES. PT SAYS HER EARS ARE CLOGGED... PT SAYS SHE WOULD LIKE TO HAVE ARTHRITIS MED FOR HER RIGHT SHOULDER AND ARM, Lipid Management, and Hypertension Management.  History of Present Illness:  Pt into the office for chronic f/u.  Chronic Allergies - constant nasal congestion, ear discomfort, sneezing and water eyes year long. Pt has been tried on several anti-histamines including claritin and allegra which failed.  She is currently on cetirizine and singulair. she also uses flonase nasal spray. +headache +nasal congestion She has also been treated with several rounds of antibiotics, head and sinus CT which did show infection and again pt was treated.    Hypertension History:      She denies headache, chest pain, and palpitations.  She notes no problems with any antihypertensive medication side effects.        Positive major cardiovascular risk factors include female age 66 years old or older, hyperlipidemia, and hypertension.  Negative major cardiovascular risk factors include no history  of diabetes, negative family history for ischemic heart disease, and non-tobacco-user status.        Further assessment for target organ damage reveals no history of ASHD, cardiac end-organ damage (CHF/LVH), stroke/TIA, peripheral vascular disease, renal insufficiency, or hypertensive retinopathy.    Lipid Management History:      Positive NCEP/ATP III risk factors include female age 79 years old or older and hypertension.  Negative NCEP/ATP III risk factors include non-diabetic, HDL cholesterol greater than 60, no family history for ischemic heart disease, non-tobacco-user status, no ASHD (atherosclerotic heart disease), no prior stroke/TIA, no peripheral vascular disease, and no history of aortic aneurysm.        The patient states that she knows about the "Therapeutic Lifestyle Change" diet.  Her compliance with the TLC diet is good.  The patient expresses understanding of adjunctive measures for cholesterol lowering.  She expresses no side effects from her lipid-lowering medication.  The patient denies any symptoms to suggest myopathy or liver disease.      Current Medications (verified): 1)  Folic Acid 1 Mg  Tabs (Folic Acid) .... Take 1 Tablet By Mouth Once Daily 2)  Triamterene-Hctz 75-50 Mg  Tabs (Triamterene-Hctz) .... One Tablet By Mouth Daily For Blood Pressure 3)  Simvastatin 80 Mg  Tabs (Simvastatin) .... Take 1 Tablet 2 Hours Before Bedtime For Cholesterol *lee, Dae H 4)  Alprazolam 0.5 Mg  Tabs (Alprazolam) .... Take 1 Tablet By Mouth Daily If Needed For Stress 5)  Fosamax 70 Mg  Tabs (Alendronate Sodium) .Marland Kitchen.. 1 Tablet By Mouth Weekly For Bones 6)  Lexapro 5 Mg Tabs (Escitalopram Oxalate) .Marland Kitchen.. 1 Tablet By Mouth Daily For Nerves 7)  Norvasc 5 Mg  Tabs (Amlodipine Besylate) .Marland Kitchen.. 1 Tablet By Mouth Daily For Blood Pressure 8)  Flonase 50 Mcg/act Susp (Fluticasone Propionate) .Marland Kitchen.. 1 Spray in Each Nostril Two Times A Day 9)  Singulair 10 Mg Tabs (Montelukast Sodium) .Marland Kitchen.. 1 Tablet By Mouth  Nightly For Allergies 10)  Auralgan  Soln (Acetic Ac-Antipy-Benzo-Polycos) .... 2 Drops in Right Ear Two Times A Day Until Symptoms Resolved 11)  Cetirizine Hcl 10 Mg Tabs (Cetirizine Hcl) .... One Tablet By Mouth Daily For Allergies  Allergies (verified): No Known Drug Allergies  Review of Systems General:  Complains of fatigue and weakness; Pt takes ativan nightly for rest.  "I can't do without that pill". ENT:  Complains of earache, nasal congestion, and sinus pressure. CV:  Denies chest pain or discomfort. Resp:  Denies cough. GI:  Denies abdominal pain, constipation, nausea, and vomiting; constipation has improved.  "I move every morning". MS:  Complains of joint pain; Right arm pain - daily pain. Sometimes ROM is limited.  Rubs in aspercream.  Tylenol x-tra strength is not effective.Marland Kitchen  Physical Exam  General:  alert.   Head:  normocephalic.   Lungs:  normal breath sounds.   Heart:  normal rate and regular rhythm.   Msk:  normal ROM.   Neurologic:  alert & oriented X3.   Skin:  color normal.   Psych:  Oriented X3.     Impression & Recommendations:  Problem # 1:  ALLERGIC RHINITIS (ICD-477.9)  Will refer to allergist for pt to get allergy testing. Her updated medication list for this problem includes:    Flonase 50 Mcg/act Susp (Fluticasone propionate) .Marland Kitchen... 1 spray in each nostril two times a day    Cetirizine Hcl 10 Mg Tabs (Cetirizine hcl) ..... One tablet by mouth daily for allergies  Orders: Allergy Referral  (Allergy)  Problem # 2:  FATIGUE (ICD-780.79) advised pt to get a good nights rest be active during the day so that she can rest at night  Problem # 3:  CONSTIPATION (ICD-564.00) improved pt is now eating fiber cereal  Problem # 4:  HYPERTENSION, BENIGN ESSENTIAL (ICD-401.1) stable contnue current meds DASH diet  Her updated medication list for this problem includes:    Triamterene-hctz 75-50 Mg Tabs (Triamterene-hctz) ..... One tablet by mouth daily  for blood pressure    Norvasc 5 Mg Tabs (Amlodipine besylate) .Marland Kitchen... 1 tablet by mouth daily for blood pressure  Orders: T-Comprehensive Metabolic Panel ([id number])  Complete Medication List: 1)  Folic Acid 1 Mg Tabs (Folic acid) .... Take 1 tablet by mouth once daily 2)  Triamterene-hctz 75-50 Mg Tabs (Triamterene-hctz) .... One tablet by mouth daily for blood pressure 3)  Simvastatin 80 Mg Tabs (Simvastatin) .... Take 1 tablet 2 hours before bedtime for cholesterol *lee, dae h 4)  Alprazolam 0.5 Mg Tabs (Alprazolam) .... Take 1 tablet by mouth daily if needed for stress 5)  Fosamax 70 Mg Tabs (Alendronate sodium) .Marland Kitchen.. 1 tablet by mouth weekly for bones 6)  Lexapro 5 Mg Tabs (Escitalopram oxalate) .Marland Kitchen.. 1 tablet by mouth daily for nerves 7)  Norvasc 5 Mg Tabs (Amlodipine besylate) .Marland Kitchen.. 1 tablet by mouth daily for blood pressure 8)  Flonase 50 Mcg/act Susp (  Fluticasone propionate) .Marland Kitchen.. 1 spray in each nostril two times a day 9)  Singulair 10 Mg Tabs (Montelukast sodium) .Marland Kitchen.. 1 tablet by mouth nightly for allergies 10)  Cetirizine Hcl 10 Mg Tabs (Cetirizine hcl) .... One tablet by mouth daily for allergies 11)  Arthrotec 75-200 Mg-mcg Tabs (Diclofenac-misoprostol) .... One tablet by mouth two times a day as needed for arthritis  Other Orders: T-Lipid Profile ([id number])  Hypertension Assessment/Plan:      The patient's hypertensive risk group is category B: At least one risk factor (excluding diabetes) with no target organ damage.  Her calculated 10 year risk of coronary heart disease is 5 %.  Today's blood pressure is 134/79.  Her blood pressure goal is < 140/90.  Lipid Assessment/Plan:      Based on NCEP/ATP III, the patient's risk factor category is "0-1 risk factors".  The patient's lipid goals are as follows: Total cholesterol goal is 200; LDL cholesterol goal is 160; HDL cholesterol goal is 40; Triglyceride goal is 150.    Patient Instructions: 1)  You will be referred to the  allergist for testing of what you are allergic. 2)  Follow up in this office in 4 months - July 2011 for blood pressure. 3)  Continue current medications. 4)  You can take arthrotec as needed for right arm pain Prescriptions: ARTHROTEC 75-200 MG-MCG TABS (DICLOFENAC-MISOPROSTOL) One tablet by mouth two times a day as needed for arthritis  #60 x 1   Entered and Authorized by:   Lehman Prom FNP   Signed by:   Lehman Prom FNP on 03/28/2009   Method used:   Electronically to        [facility] [phone]* (retail)       [address]       [place], [place]  [address]       Ph: [phone]       Fax: [phone]   RxID:   [id number] SINGULAIR 10 MG TABS (MONTELUKAST SODIUM) 1 tablet by mouth nightly for allergies  #30 Tablet x 6   Entered and Authorized by:   Lehman Prom FNP   Signed by:   Lehman Prom FNP on 03/28/2009   Method used:   Electronically to        [facility] [phone]* (retail)       [address]       [place], [place]  [address]       Ph: [phone]       Fax: [phone]   RxID:   [id number] NORVASC 5 MG  TABS (AMLODIPINE BESYLATE) 1 tablet by mouth daily for blood pressure  #30 Tablet x 6   Entered and Authorized by:   Lehman Prom FNP   Signed by:   Lehman Prom FNP on 03/28/2009   Method used:   Electronically to        [facility] [address] [phone]* (retail)       [address]       [place], [place]  [address]       Ph: [phone]       Fax: [phone]   RxID:   [id number]

## 2010-02-18 NOTE — Progress Notes (Signed)
Summary: Barium Swallow Results  Phone Note Outgoing Call   Summary of Call: notify pt that she does have an esophageal stricture will refer her to GI ASAP - advise pt NOT to eat any meat during thanksgiving to prevent strangulation Initial call taken by: Lehman Prom FNP,  December 11, 2009 4:41 PM  Follow-up for Phone Call        Pt. notified and verbalized understanding no meat, sm bites of food chewed well that will be easy to swallow. Will contact her as soon as GI appt. is scheduled. Follow-up by: Gaylyn Cheers RN,  December 11, 2009 4:53 PM  New Problems: ESOPHAGEAL STRICTURE (ICD-530.3)   New Problems: ESOPHAGEAL STRICTURE (ICD-530.3)

## 2010-02-18 NOTE — Letter (Signed)
Summary: *HSN Results Follow up  [facility]  [address] [place], [place] [address]   Phone: [phone]  Fax: [phone]      12/10/2009   Casey [name] [address] [place], [place]  [address]   Dear  [name],                            ____S.Drinkard,FNP   ____D. Gore,FNP       ____B. McPherson,MD   ____V. Rankins,MD    ____E. Mulberry,MD    _X___N. Daphine Deutscher, FNP  ____D. Reche Dixon, MD    ____K. Philipp Deputy, MD    ____Other     This letter is to inform you that your recent test(s):  _______Pap Smear    ___X____Lab Test     _______X-ray    Comments:  Labs done during recent office visit show that your blood sugar was a little high.  You were NOT fasting on this visit so that may be the reason.  If you ate breakfast on the day of your visit then this may be the cause for the slight increase in blood sugar. Your labs will be checked again at your next follow up appointment.       _________________________________________________________ If you have any questions, please contact our office [phone].                    Sincerely,    Lehman Prom FNP [facility]

## 2010-02-18 NOTE — Progress Notes (Signed)
Summary: Office Visit//depression screening  Office Visit//depression screening   Imported By: Arta Bruce 12/09/2009 14:05:53  _____________________________________________________________________  External Attachment:    Type:   Image     Comment:   External Document

## 2010-02-18 NOTE — Assessment & Plan Note (Signed)
Summary: HTN   Vital Signs:  Patient profile:   75 year old female Menstrual status:  postmenopausal Weight:      182.6 pounds BMI:     34.07 Temp:     97.4 degrees F oral Pulse rate:   88 / minute Pulse rhythm:   regular Resp:     20 per minute BP sitting:   116 / 70  (left arm) Cuff size:   large  Vitals Entered By: Levon Hedger (December 09, 2009 8:45 AM)  Nutrition Counseling: Patient's BMI is greater than 25 and therefore counseled on weight management options. CC: CPP...pt states her left side hurts and she has chest discomfort, Hypertension Management, Lipid Management Is Patient Diabetic? No Pain Assessment Patient in pain? no       Does patient need assistance? Functional Status Self care Ambulation Normal     Menstrual Status postmenopausal Last PAP Result normal   Primary Care Provider:  Lehman Prom FNP  CC:  CPP...pt states her left side hurts and she has chest discomfort, Hypertension Management, and Lipid Management.  History of Present Illness:  Pt into the office for a CPE  Mammogram - done 3 days ago.  no breast history of breast cancer. Pt does check her breast at home  Tdap -up to date  Flu vaccine - given at local pharamcy 1 week ago  Colonscopy - last done in 2009 reports normal BM daily in the mornings. some constipation when she does not eat the correct foods - such as fiber foods  GYN  - No family history of cervical or ovarian cancer. Last PAP Smear was normal.  Pt presents today with all her medications  Optho - cataracts removed almost 1 year ago.  wears glasses and maintains contact with optho  Dental - has a partial but she does not wear it - very uncomfortable   Social - pt has been to [place] to spend some time with her son and reports she had a good time  Hypertension History:      She denies headache, chest pain, and palpitations.  She notes no problems with any antihypertensive medication side effects.    Positive major cardiovascular risk factors include female age 60 years old or older, hyperlipidemia, and hypertension.  Negative major cardiovascular risk factors include no history of diabetes, negative family history for ischemic heart disease, and non-tobacco-user status.        Further assessment for target organ damage reveals no history of ASHD, cardiac end-organ damage (CHF/LVH), stroke/TIA, peripheral vascular disease, renal insufficiency, or hypertensive retinopathy.    Lipid Management History:      Positive NCEP/ATP III risk factors include female age 52 years old or older and hypertension.  Negative NCEP/ATP III risk factors include no history of early menopause without estrogen hormone replacement, non-diabetic, HDL cholesterol greater than 60, no family history for ischemic heart disease, non-tobacco-user status, no ASHD (atherosclerotic heart disease), no prior stroke/TIA, no peripheral vascular disease, and no history of aortic aneurysm.        The patient states that she knows about the "Therapeutic Lifestyle Change" diet.  Her compliance with the TLC diet is good.  The patient does not know about adjunctive measures for cholesterol lowering.  She expresses no side effects from her lipid-lowering medication.  The patient denies any symptoms to suggest myopathy or liver disease.      Habits & Providers  Alcohol-Tobacco-Diet     Alcohol drinks/day: 0     Tobacco  Status: never  Exercise-Depression-Behavior     Does Patient Exercise: yes     Exercise Counseling: not indicated; exercise is adequate     Type of exercise: stable bicycle     Exercise (avg: min/session): 30-60     Times/week: 5     Depression Counseling: not indicated; screening negative for depression     Drug Use: never     Seat Belt Use: 100     Sun Exposure: rarely  Comments: PHQ- 9 scan 11  Medications Prior to Update: 1)  Folic Acid 1 Mg  Tabs (Folic Acid) .... Take 1 Tablet By Mouth Once Daily 2)   Triamterene-Hctz 75-50 Mg  Tabs (Triamterene-Hctz) .... One Tablet By Mouth Daily For Blood Pressure 3)  Simvastatin 80 Mg  Tabs (Simvastatin) .... One Tablet By Mouth Nightly For Cholesterol 4)  Alprazolam 0.5 Mg  Tabs (Alprazolam) .... Take 1 Tablet By Mouth Daily If Needed For Stress 5)  Fosamax 70 Mg  Tabs (Alendronate Sodium) .Marland Kitchen.. 1 Tablet By Mouth Weekly For Bones 6)  Lexapro 5 Mg Tabs (Escitalopram Oxalate) .Marland Kitchen.. 1 Tablet By Mouth Daily For Nerves 7)  Norvasc 5 Mg  Tabs (Amlodipine Besylate) .Marland Kitchen.. 1 Tablet By Mouth Daily For Blood Pressure 8)  Omnaris 50 Mcg/act Susp (Ciclesonide) .... Rx By Allergist 9)  Singulair 10 Mg Tabs (Montelukast Sodium) .Marland Kitchen.. 1 Tablet By Mouth Nightly For Allergies 10)  Allegra 180 Mg Tabs (Fexofenadine Hcl) .... One Tablet By Mouth Daily 11)  Arthrotec 75-200 Mg-Mcg Tabs (Diclofenac-Misoprostol) .... One Tablet By Mouth Two Times A Day As Needed For Arthritis 12)  Astepro 0.15 % Soln (Azelastine Hcl) .... Rx By Allergist 13)  Zostavax 84132 Unt/0.29ml Solr (Zoster Vaccine Live) .... One Injection Subcutaneously X 1 Dose  Current Medications (verified): 1)  Folic Acid 1 Mg  Tabs (Folic Acid) .... Take 1 Tablet By Mouth Once Daily 2)  Triamterene-Hctz 75-50 Mg  Tabs (Triamterene-Hctz) .... One Tablet By Mouth Daily For Blood Pressure 3)  Simvastatin 80 Mg  Tabs (Simvastatin) .... One Tablet By Mouth Nightly For Cholesterol 4)  Alprazolam 0.5 Mg  Tabs (Alprazolam) .... Take 1 Tablet By Mouth Daily If Needed For Stress 5)  Fosamax 70 Mg  Tabs (Alendronate Sodium) .Marland Kitchen.. 1 Tablet By Mouth Weekly For Bones 6)  Lexapro 5 Mg Tabs (Escitalopram Oxalate) .Marland Kitchen.. 1 Tablet By Mouth Daily For Nerves 7)  Norvasc 5 Mg  Tabs (Amlodipine Besylate) .Marland Kitchen.. 1 Tablet By Mouth Daily For Blood Pressure 8)  Omnaris 50 Mcg/act Susp (Ciclesonide) .... Rx By Allergist 9)  Singulair 10 Mg Tabs (Montelukast Sodium) .Marland Kitchen.. 1 Tablet By Mouth Nightly For Allergies 10)  Allegra 180 Mg Tabs (Fexofenadine  Hcl) .... One Tablet By Mouth Daily 11)  Arthrotec 75-200 Mg-Mcg Tabs (Diclofenac-Misoprostol) .... One Tablet By Mouth Two Times A Day As Needed For Arthritis 12)  Astepro 0.15 % Soln (Azelastine Hcl) .... Rx By Allergist 13)  Zostavax 44010 Unt/0.64ml Solr (Zoster Vaccine Live) .... One Injection Subcutaneously X 1 Dose  Allergies (verified): No Known Drug Allergies  Review of Systems ENT:  Complains of difficulty swallowing; Notices at times when she eats chicken breast or thick meat then she has trouble swallowing.  She has to drink hot water to bring the food back up. CV:  Complains of chest pain or discomfort. Resp:  Denies cough. GI:  Complains of constipation; denies abdominal pain, nausea, and vomiting. GU:  "i can feel something comes out of me when I strain".  Physical Exam  General:  well-developed.   Head:  normocephalic.   Eyes:  glasses Ears:  bil TM with bony landmarks present Mouth:  missing upper front teeth Lungs:  normal breath sounds.   Heart:  normal rate and regular rhythm.   Abdomen:  normal bowel sounds.   Extremities:  no edema Neurologic:  alert & oriented X3.     Impression & Recommendations:  Problem # 1:  HYPERTENSION, BENIGN ESSENTIAL (ICD-401.1) BP is stable DASH diet Her updated medication list for this problem includes:    Triamterene-hctz 75-50 Mg Tabs (Triamterene-hctz) ..... One tablet by mouth daily for blood pressure    Norvasc 5 Mg Tabs (Amlodipine besylate) .Marland Kitchen... 1 tablet by mouth daily for blood pressure  Orders: EKG w/ Interpretation (93000) T-Basic Metabolic Panel (16109-60454) UA Dipstick w/o Micro (manual) (09811)  Problem # 2:  ANXIETY (ICD-300.00)  Her updated medication list for this problem includes:    Alprazolam 0.5 Mg Tabs (Alprazolam) .Marland Kitchen... Take 1 tablet by mouth daily if needed for stress    Lexapro 5 Mg Tabs (Escitalopram oxalate) .Marland Kitchen... 1 tablet by mouth daily for nerves  Problem # 3:  HYPERCHOLESTEROLEMIA  (ICD-272.0)  Her updated medication list for this problem includes:    Simvastatin 80 Mg Tabs (Simvastatin) ..... One tablet by mouth nightly for cholesterol  Problem # 4:  PROBLEMS WITH SWALLOWING AND MASTICATION (ICD-V41.6)  Orders: Mod Barium Swallow (Mod Barium Swallow)  Problem # 5:  OBESITY (ICD-278.00)  Orders: T-TSH (91478-29562)  Complete Medication List: 1)  Folic Acid 1 Mg Tabs (Folic acid) .... Take 1 tablet by mouth once daily 2)  Triamterene-hctz 75-50 Mg Tabs (Triamterene-hctz) .... One tablet by mouth daily for blood pressure 3)  Simvastatin 80 Mg Tabs (Simvastatin) .... One tablet by mouth nightly for cholesterol 4)  Alprazolam 0.5 Mg Tabs (Alprazolam) .... Take 1 tablet by mouth daily if needed for stress 5)  Fosamax 70 Mg Tabs (Alendronate sodium) .Marland Kitchen.. 1 tablet by mouth weekly for bones 6)  Lexapro 5 Mg Tabs (Escitalopram oxalate) .Marland Kitchen.. 1 tablet by mouth daily for nerves 7)  Norvasc 5 Mg Tabs (Amlodipine besylate) .Marland Kitchen.. 1 tablet by mouth daily for blood pressure 8)  Omnaris 50 Mcg/act Susp (Ciclesonide) .... Rx by allergist 9)  Singulair 10 Mg Tabs (Montelukast sodium) .Marland Kitchen.. 1 tablet by mouth nightly for allergies 10)  Allegra 180 Mg Tabs (Fexofenadine hcl) .... One tablet by mouth daily 11)  Arthrotec 75-200 Mg-mcg Tabs (Diclofenac-misoprostol) .... One tablet by mouth two times a day as needed for arthritis 12)  Astepro 0.15 % Soln (Azelastine hcl) .... Rx by allergist  Other Orders: Hemoccult Cards (Take Home) (Hemoccult Cards) T-CBC w/Diff [id number])  Hypertension Assessment/Plan:      The patient's hypertensive risk group is category B: At least one risk factor (excluding diabetes) with no target organ damage.  Her calculated 10 year risk of coronary heart disease is 3 %.  Today's blood pressure is 116/70.  Her blood pressure goal is < 140/90.  Lipid Assessment/Plan:      Based on NCEP/ATP III, the patient's risk factor category is "0-1 risk factors".  The  patient's lipid goals are as follows: Total cholesterol goal is 200; LDL cholesterol goal is 160; HDL cholesterol goal is 40; Triglyceride goal is 150.  Her LDL cholesterol goal has been met.    Patient Instructions: 1)  Your labs will be checked today and you will be notified of the results 2)  You will be  scheduled an appointment for a barium swallow.  This will check to see if you have a stricture/narrowing that affects you eating meat 3)  Follow up in 4-6 weeks for results of swallow study.   Orders Added: 1)  Est. Patient Level III [16109] 2)  EKG w/ Interpretation [93000] 3)  Hemoccult Cards (Take Home) [Hemoccult Cards] 4)  T-Basic Metabolic Panel [80048-22910] 5)  T-CBC w/Diff [60454-09811] 6)  T-TSH [91478-29562] 7)  UA Dipstick w/o Micro (manual) [81002] 8)  Mod Barium Swallow [Mod Barium Swallow]     EKG  Procedure date:  12/09/2009  Findings:      NSR    Laboratory Results   Urine Tests  Date/Time Received: December 09, 2009 9:39 AM   Routine Urinalysis   Color: lt. yellow Glucose: negative   (Normal Range: Negative) Bilirubin: negative   (Normal Range: Negative) Ketone: negative   (Normal Range: Negative) Spec. Gravity: 1.010   (Normal Range: 1.003-1.035) Blood: trace-intact   (Normal Range: Negative) pH: 8.0   (Normal Range: 5.0-8.0) Protein: negative   (Normal Range: Negative) Urobilinogen: 0.2   (Normal Range: 0-1) Nitrite: negative   (Normal Range: Negative) Leukocyte Esterace: negative   (Normal Range: Negative)        Prevention & Chronic Care Immunizations   Influenza vaccine: given at [facility]  (12/02/2009)    Tetanus booster: 09/28/2007: Tdap [facility])    Pneumococcal vaccine: Pneumovax  (03/24/2007)    H. zoster vaccine: Not documented   H. zoster vaccine deferral: Refused  (12/09/2009)  Colorectal Screening   Hemoccult: negative  (03/24/2007)    Colonoscopy: Post -polypectomy scar at 24 cm.  Path showed hyperplastic changes  without adenomatous epithelium or malignancy.  Repeat in 3 years.  Dr.  Barrett [facility]  (08/16/2007)   Colonoscopy due: 03/2010  Other Screening   Pap smear: normal  (03/24/2007)   Pap smear action/deferral: Deferred  (12/09/2009)    Mammogram: ASSESSMENT: Negative - BI-RADS 1^MM DIGITAL SCREENING  (12/05/2008)    DXA bone density scan:  Lumbar Spine:  T Score > -1.0 Spine.  Hip Total: T Score > -1.0 Hip.    (01/02/2009)   DXA scan due: 12/2010    Smoking status: never  (12/09/2009)  Lipids   Total Cholesterol: 167  (03/28/2009)   LDL: 64  (03/28/2009)   LDL Direct: Not documented   HDL: 86  (03/28/2009)   Triglycerides: 83  (03/28/2009)    SGOT (AST): 21  (03/28/2009)   SGPT (ALT): 24  (03/28/2009)   Alkaline phosphatase: 90  (03/28/2009)   Total bilirubin: 0.4  (03/28/2009)  Hypertension   Last Blood Pressure: 116 / 70  (12/09/2009)   Serum creatinine: 0.88  (03/28/2009)   Serum potassium 3.9  (03/28/2009)  Self-Management Support :    Hypertension self-management support: Not documented    Lipid self-management support: Not documented

## 2010-02-18 NOTE — Miscellaneous (Signed)
Summary: Med changes  - By allergist  Clinical Lists Changes  Medications: Changed medication from CETIRIZINE HCL 10 MG TABS (CETIRIZINE HCL) One tablet by mouth daily for allergies to ALLEGRA 180 MG TABS (FEXOFENADINE HCL) One tablet by mouth daily Changed medication from FLONASE 50 MCG/ACT SUSP (FLUTICASONE PROPIONATE) 1 spray in each nostril two times a day to OMNARIS 50 MCG/ACT SUSP (CICLESONIDE) Rx by Allergist Added new medication of ASTEPRO 0.15 % SOLN (AZELASTINE HCL) Rx by allergist

## 2010-02-20 NOTE — Progress Notes (Signed)
Summary: cx appt  ---- Converted from flag ---- ---- 01/31/2010 3:02 PM, Rachael Fee MD wrote: she had a colonoscopy wiht Dr. Madilyn Fireman in the [facility] 2 years ago.  should follow up with him.  (has new appt with me next week) ------------------------------  Appended Document: cx appt pt aware and Dr Christella Hartigan flag will be sent to Lehman Prom FNP.  Appt has been cx in [facility].

## 2010-02-20 NOTE — Assessment & Plan Note (Signed)
Summary: F/u Barium Swallow   Vital Signs:  Patient profile:   75 year old female Menstrual status:  postmenopausal Weight:      184.3 pounds BMI:     34.38 BSA:     1.84 Temp:     96.9 degrees F oral Pulse rate:   88 / minute Pulse rhythm:   regular Resp:     20 per minute BP sitting:   130 / 90  (left arm) Cuff size:   large  Vitals Entered By: Levon Hedger (February 06, 2010 10:43 AM)  Nutrition Counseling: Patient's BMI is greater than 25 and therefore counseled on weight management options. CC: follow-up visit swallowing test...chest still having discomfort, Hypertension Management, Lipid Management, Abdominal Pain Is Patient Diabetic? No Pain Assessment Patient in pain? yes     Location: chest  Does patient need assistance? Functional Status Self care Ambulation Normal   Primary Care Provider:  Lehman Prom FNP  CC:  follow-up visit swallowing test...chest still having discomfort, Hypertension Management, Lipid Management, and Abdominal Pain.  History of Present Illness:  Pt into the office for f/u barium swallow.  However pt has not been for the test yet.  Pt presents today with medications  Dyspepsia History:      She has no alarm features of dyspepsia including no history of melena, hematochezia, dysphagia, persistent vomiting, or involuntary weight loss > 5%.  There is a prior history of GERD.  The patient does not have a prior history of documented ulcer disease.  The dominant symptom is not heartburn or acid reflux.  An H-2 blocker medication is not currently being taken.    Hypertension History:      She denies headache, chest pain, and palpitations.        Positive major cardiovascular risk factors include female age 52 years old or older, hyperlipidemia, and hypertension.  Negative major cardiovascular risk factors include no history of diabetes, negative family history for ischemic heart disease, and non-tobacco-user status.        Further  assessment for target organ damage reveals no history of ASHD, cardiac end-organ damage (CHF/LVH), stroke/TIA, peripheral vascular disease, renal insufficiency, or hypertensive retinopathy.    Lipid Management History:      Positive NCEP/ATP III risk factors include female age 16 years old or older and hypertension.  Negative NCEP/ATP III risk factors include no history of early menopause without estrogen hormone replacement, non-diabetic, HDL cholesterol greater than 60, no family history for ischemic heart disease, non-tobacco-user status, no ASHD (atherosclerotic heart disease), no prior stroke/TIA, no peripheral vascular disease, and no history of aortic aneurysm.        The patient states that she knows about the "Therapeutic Lifestyle Change" diet.  Her compliance with the TLC diet is good.  The patient does not know about adjunctive measures for cholesterol lowering.  She expresses no side effects from her lipid-lowering medication.  The patient denies any symptoms to suggest myopathy or liver disease.       Habits & Providers  Alcohol-Tobacco-Diet     Alcohol drinks/day: 0     Tobacco Status: never  Exercise-Depression-Behavior     Does Patient Exercise: yes     Exercise Counseling: not indicated; exercise is adequate     Type of exercise: stable bicycle     Exercise (avg: min/session): 30-60     Times/week: 5     Have you felt down or hopeless? no     Have you felt little pleasure in  things? no     Depression Counseling: not indicated; screening negative for depression     Drug Use: never     Seat Belt Use: 100     Sun Exposure: rarely  Medications Prior to Update: 1)  Folic Acid 1 Mg  Tabs (Folic Acid) .... Take 1 Tablet By Mouth Once Daily 2)  Triamterene-Hctz 75-50 Mg  Tabs (Triamterene-Hctz) .... One Tablet By Mouth Daily For Blood Pressure 3)  Simvastatin 80 Mg  Tabs (Simvastatin) .... One Tablet By Mouth Nightly For Cholesterol 4)  Alprazolam 0.5 Mg  Tabs (Alprazolam)  .... Take 1 Tablet By Mouth Daily If Needed For Stress 5)  Fosamax 70 Mg  Tabs (Alendronate Sodium) .Marland Kitchen.. 1 Tablet By Mouth Weekly For Bones 6)  Lexapro 5 Mg Tabs (Escitalopram Oxalate) .Marland Kitchen.. 1 Tablet By Mouth Daily For Nerves 7)  Norvasc 5 Mg  Tabs (Amlodipine Besylate) .Marland Kitchen.. 1 Tablet By Mouth Daily For Blood Pressure 8)  Omnaris 50 Mcg/act Susp (Ciclesonide) .... Rx By Allergist 9)  Singulair 10 Mg Tabs (Montelukast Sodium) .Marland Kitchen.. 1 Tablet By Mouth Nightly For Allergies 10)  Allegra 180 Mg Tabs (Fexofenadine Hcl) .... One Tablet By Mouth Daily 11)  Arthrotec 75-200 Mg-Mcg Tabs (Diclofenac-Misoprostol) .... One Tablet By Mouth Two Times A Day As Needed For Arthritis 12)  Astepro 0.15 % Soln (Azelastine Hcl) .... Rx By Allergist  Current Medications (verified): 1)  Folic Acid 1 Mg  Tabs (Folic Acid) .... Take 1 Tablet By Mouth Once Daily 2)  Triamterene-Hctz 75-50 Mg  Tabs (Triamterene-Hctz) .... One Tablet By Mouth Daily For Blood Pressure 3)  Simvastatin 80 Mg  Tabs (Simvastatin) .... One Tablet By Mouth Nightly For Cholesterol 4)  Alprazolam 0.5 Mg  Tabs (Alprazolam) .... Take 1 Tablet By Mouth Daily If Needed For Stress 5)  Fosamax 70 Mg  Tabs (Alendronate Sodium) .Marland Kitchen.. 1 Tablet By Mouth Weekly For Bones 6)  Lexapro 5 Mg Tabs (Escitalopram Oxalate) .Marland Kitchen.. 1 Tablet By Mouth Daily For Nerves 7)  Norvasc 5 Mg  Tabs (Amlodipine Besylate) .Marland Kitchen.. 1 Tablet By Mouth Daily For Blood Pressure 8)  Omnaris 50 Mcg/act Susp (Ciclesonide) .... Rx By Allergist 9)  Singulair 10 Mg Tabs (Montelukast Sodium) .Marland Kitchen.. 1 Tablet By Mouth Nightly For Allergies 10)  Allegra 180 Mg Tabs (Fexofenadine Hcl) .... One Tablet By Mouth Daily 11)  Arthrotec 75-200 Mg-Mcg Tabs (Diclofenac-Misoprostol) .... One Tablet By Mouth Two Times A Day As Needed For Arthritis 12)  Astepro 0.15 % Soln (Azelastine Hcl) .... Rx By Allergist  Allergies (verified): No Known Drug Allergies  Review of Systems General:  Denies fever. ENT:   Complains of difficulty swallowing. CV:  Denies fatigue. Resp:  Denies cough. GI:  Complains of indigestion.  Physical Exam  General:  alert.   Head:  normocephalic.   Eyes:  glasses Lungs:  normal breath sounds.   Heart:  normal rate and regular rhythm.   Msk:  up to the exam table Neurologic:  alert & oriented X3.     Impression & Recommendations:  Problem # 1:  ESOPHAGEAL STRICTURE (ICD-530.3) Pt has been referred to [facility]  Problem # 2:  PROBLEMS WITH SWALLOWING AND MASTICATION (ICD-V41.6) ongoing - pt has been eating soft foods as ordered  Problem # 3:  HYPERGLYCEMIA (ICD-790.29)  will check today  Orders: Capillary Blood Glucose/CBG (16109)  Problem # 4:  HYPERTENSION, BENIGN ESSENTIAL (ICD-401.1) BP stable conitnue curernt meds Her updated medication list for this problem includes:  Triamterene-hctz 75-50 Mg Tabs (Triamterene-hctz) ..... One tablet by mouth daily for blood pressure    Norvasc 5 Mg Tabs (Amlodipine besylate) .Marland Kitchen... 1 tablet by mouth daily for blood pressure  Complete Medication List: 1)  Folic Acid 1 Mg Tabs (Folic acid) .... Take 1 tablet by mouth once daily 2)  Triamterene-hctz 75-50 Mg Tabs (Triamterene-hctz) .... One tablet by mouth daily for blood pressure 3)  Simvastatin 80 Mg Tabs (Simvastatin) .... One tablet by mouth nightly for cholesterol 4)  Alprazolam 0.5 Mg Tabs (Alprazolam) .... Take 1 tablet by mouth daily if needed for stress 5)  Fosamax 70 Mg Tabs (Alendronate sodium) .Marland Kitchen.. 1 tablet by mouth weekly for bones 6)  Lexapro 5 Mg Tabs (Escitalopram oxalate) .Marland Kitchen.. 1 tablet by mouth daily for nerves 7)  Norvasc 5 Mg Tabs (Amlodipine besylate) .Marland Kitchen.. 1 tablet by mouth daily for blood pressure 8)  Omnaris 50 Mcg/act Susp (Ciclesonide) .... Rx by allergist 9)  Singulair 10 Mg Tabs (Montelukast sodium) .Marland Kitchen.. 1 tablet by mouth nightly for allergies 10)  Allegra 180 Mg Tabs (Fexofenadine hcl) .... One tablet by mouth daily 11)  Arthrotec  75-200 Mg-mcg Tabs (Diclofenac-misoprostol) .... One tablet by mouth two times a day as needed for arthritis 12)  Astepro 0.15 % Soln (Azelastine hcl) .... Rx by allergist  Hypertension Assessment/Plan:      The patient's hypertensive risk group is category B: At least one risk factor (excluding diabetes) with no target organ damage.  Her calculated 10 year risk of coronary heart disease is 7 %.  Today's blood pressure is 130/90.  Her blood pressure goal is < 140/90.  Lipid Assessment/Plan:      Based on NCEP/ATP III, the patient's risk factor category is "2 or more risk factors and a calculated 10 year CAD risk of < 20%".  The patient's lipid goals are as follows: Total cholesterol goal is 200; LDL cholesterol goal is 160; HDL cholesterol goal is 40; Triglyceride goal is 150.  Her LDL cholesterol goal has been met.    Patient Instructions: 1)  Be sure to keep your appointment with [facility] 2)  Keep eating soft foods until then. 3)  Call for a follow up in 3 months for blood pressure and anxiety. Prescriptions: SIMVASTATIN 80 MG  TABS (SIMVASTATIN) One tablet by mouth nightly for cholesterol  #30 x 11   Entered and Authorized by:   Lehman Prom FNP   Signed by:   Lehman Prom FNP on 02/06/2010   Method used:   Electronically to        [facility] [phone]* (retail)       [address]       [place], [place]  [address]       Ph: [phone]       Fax: [phone]   RxID:   [id number] FOSAMAX 70 MG  TABS (ALENDRONATE SODIUM) 1 tablet by mouth weekly for bones  #4 Tablet x 11   Entered and Authorized by:   Lehman Prom FNP   Signed by:   Lehman Prom FNP on 02/06/2010   Method used:   Electronically to        [facility] [phone]* (retail)       [address]       [place], [place]  [address]       Ph: [phone]       Fax: [phone]   RxID:   [id number] LEXAPRO 5 MG TABS (ESCITALOPRAM OXALATE) 1  tablet by mouth daily for nerves  #30 Tablet x 11    Entered and Authorized by:   Lehman Prom FNP   Signed by:   Lehman Prom FNP on 02/06/2010   Method used:   Electronically to        [facility] [phone]* (retail)       [address]       [place], [place]  [address]       Ph: [phone]       Fax: [phone]   RxID:   [id number] NORVASC 5 MG  TABS (AMLODIPINE BESYLATE) 1 tablet by mouth daily for blood pressure  #30 Tablet x 11   Entered and Authorized by:   Lehman Prom FNP   Signed by:   Lehman Prom FNP on 02/06/2010   Method used:   Electronically to        [facility] [phone]* (retail)       [address]       [place], [place]  [address]       Ph: [phone]       Fax: [phone]   RxID:   [id number] TRIAMTERENE-HCTZ 75-50 MG  TABS (TRIAMTERENE-HCTZ) One tablet by mouth daily for blood pressure  #30 Tablet x 11   Entered and Authorized by:   Lehman Prom FNP   Signed by:   Lehman Prom FNP on 02/06/2010   Method used:   Electronically to        [facility] [phone]* (retail)       [address]       [place], [place]  [address]       Ph: [phone]       Fax: [phone]   RxID:   [id number]    Orders Added: 1)  Est. Patient Level III [89381] 2)  Capillary Blood Glucose/CBG [01751]    Laboratory Results      Appended Document: F/u Barium Swallow    CBG Result 95 Comments recheck temp:  97.1   Allergies: No Known Drug Allergies   Complete Medication List: 1)  Folic Acid 1 Mg Tabs (Folic acid) .... Take 1 tablet by mouth once daily 2)  Triamterene-hctz 75-50 Mg Tabs (Triamterene-hctz) .... One tablet by mouth daily for blood pressure 3)  Simvastatin 80 Mg Tabs (Simvastatin) .... One tablet by mouth nightly for cholesterol 4)  Alprazolam 0.5 Mg Tabs (Alprazolam) .... Take 1 tablet by mouth daily if needed for stress 5)  Fosamax 70 Mg Tabs (Alendronate sodium) .Marland Kitchen.. 1 tablet by mouth weekly for bones 6)  Lexapro 5 Mg Tabs (Escitalopram oxalate)  .Marland Kitchen.. 1 tablet by mouth daily for nerves 7)  Norvasc 5 Mg Tabs (Amlodipine besylate) .Marland Kitchen.. 1 tablet by mouth daily for blood pressure 8)  Omnaris 50 Mcg/act Susp (Ciclesonide) .... Rx by allergist 9)  Singulair 10 Mg Tabs (Montelukast sodium) .Marland Kitchen.. 1 tablet by mouth nightly for allergies 10)  Allegra 180 Mg Tabs (Fexofenadine hcl) .... One tablet by mouth daily 11)  Arthrotec 75-200 Mg-mcg Tabs (Diclofenac-misoprostol) .... One tablet by mouth two times a day as needed for arthritis 12)  Astepro 0.15 % Soln (Azelastine hcl) .... Rx by allergist      Laboratory Results   Blood Tests   Date/Time Received: February 06, 2010 2:20 PM   CBG Random:: 95

## 2010-02-20 NOTE — Medication Information (Signed)
Summary: RX Folder//[facility]  RX Folder//[facility]   Imported By: Arta Bruce 01/15/2010 15:13:51  _____________________________________________________________________  External Attachment:    Type:   Image     Comment:   External Document

## 2010-02-20 NOTE — Letter (Signed)
Summary: [facility]   Imported By: Arta Bruce 01/31/2010 14:29:32  _____________________________________________________________________  External Attachment:    Type:   Image     Comment:   External Document

## 2010-02-26 ENCOUNTER — Encounter (INDEPENDENT_AMBULATORY_CARE_PROVIDER_SITE_OTHER): Payer: Self-pay | Admitting: Nurse Practitioner

## 2010-02-28 ENCOUNTER — Encounter (INDEPENDENT_AMBULATORY_CARE_PROVIDER_SITE_OTHER): Payer: Self-pay | Admitting: Nurse Practitioner

## 2010-03-06 ENCOUNTER — Encounter (INDEPENDENT_AMBULATORY_CARE_PROVIDER_SITE_OTHER): Payer: Self-pay | Admitting: Nurse Practitioner

## 2010-03-10 ENCOUNTER — Telehealth (INDEPENDENT_AMBULATORY_CARE_PROVIDER_SITE_OTHER): Payer: Self-pay | Admitting: Nurse Practitioner

## 2010-03-10 DIAGNOSIS — N812 Incomplete uterovaginal prolapse: Secondary | ICD-10-CM | POA: Insufficient documentation

## 2010-03-12 NOTE — Letter (Signed)
Summary: [facility]   Imported By: Arta Bruce 03/06/2010 15:21:04  _____________________________________________________________________  External Attachment:    Type:   Image     Comment:   External Document

## 2010-03-12 NOTE — Miscellaneous (Signed)
Summary: Test results  Clinical Lists Changes  Observations: Added new observation of COLONOSCOPY: [facility] - one 8mm polyp in the ascending colon. resected and retrieved. diverticulosis in the sigmoid colon (02/26/2010 13:54) Added new observation of EGD: [facility] - mild schatzki ring, dilated otherwise normal (02/26/2010 13:53)      EGD  Procedure date:  02/26/2010  Findings:      [facility] - mild schatzki ring, dilated otherwise normal  Colonoscopy  Procedure date:  02/26/2010  Findings:      [facility] - one 8mm polyp in the ascending colon. resected and retrieved. diverticulosis in the sigmoid colon   EGD  Procedure date:  02/26/2010  Findings:      [facility] - mild schatzki ring, dilated otherwise normal  Colonoscopy  Procedure date:  02/26/2010  Findings:      [facility] - one 8mm polyp in the ascending colon. resected and retrieved. diverticulosis in the sigmoid colon

## 2010-03-12 NOTE — Letter (Signed)
[facility]   Imported By: Arta Bruce 03/06/2010 15:23:18  _____________________________________________________________________  External Attachment:    Type:   Image     Comment:   External Document

## 2010-03-12 NOTE — Letter (Signed)
Summary: [facility]   Imported By: Arta Bruce 03/04/2010 09:44:53  _____________________________________________________________________  External Attachment:    Type:   Image     Comment:   External Document

## 2010-03-18 NOTE — Progress Notes (Signed)
Summary: Casey [name] DOWN  Phone Note Call from Patient Call back at Home Phone [phone]   Reason for Call: Referral Summary of Call: [name] CALLED AND SAYS THAT HER WOMB IS HANGING DOWN AND SHE STAYS SICK ON THE STOMACH A LOT AND HER LEFT SIDE HURTS. SO SHE WANT A REFERRAL TO SEE SOMEONE. Initial call taken by: Leodis Rains,  March 10, 2010 11:37 AM  Follow-up for Phone Call        Can touch her uterus -- protruding past edge of vagina.  When she lifts something heavy, protrudes further.  Is making her nauseated, left side hurts a lot.  Denies bleeding, discharge, fever, pain.   No trouble urinating.  Has been going on a year.  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

## 2010-06-15 ENCOUNTER — Inpatient Hospital Stay (INDEPENDENT_AMBULATORY_CARE_PROVIDER_SITE_OTHER)
Admission: RE | Admit: 2010-06-15 | Discharge: 2010-06-15 | Disposition: A | Discharge status: Home / Self Care | Payer: Medicare Other | Source: Ambulatory Visit | Attending: Emergency Medicine | Admitting: Emergency Medicine

## 2010-06-15 DIAGNOSIS — H60399 Other infective otitis externa, unspecified ear: Secondary | ICD-10-CM

## 2010-11-06 ENCOUNTER — Other Ambulatory Visit: Payer: Self-pay | Admitting: Family Medicine

## 2010-11-06 DIAGNOSIS — Z1231 Encounter for screening mammogram for malignant neoplasm of breast: Secondary | ICD-10-CM

## 2010-11-20 HISTORY — PX: TRANSTHORACIC ECHOCARDIOGRAM: SHX275

## 2010-11-25 ENCOUNTER — Encounter: Payer: Self-pay | Admitting: *Deleted

## 2010-11-26 ENCOUNTER — Ambulatory Visit (INDEPENDENT_AMBULATORY_CARE_PROVIDER_SITE_OTHER): Payer: Medicare Other | Admitting: Cardiovascular Disease

## 2010-11-26 ENCOUNTER — Encounter: Payer: Self-pay | Admitting: Cardiovascular Disease

## 2010-11-26 DIAGNOSIS — E7800 Pure hypercholesterolemia, unspecified: Secondary | ICD-10-CM

## 2010-11-26 DIAGNOSIS — R079 Chest pain, unspecified: Secondary | ICD-10-CM | POA: Insufficient documentation

## 2010-11-26 DIAGNOSIS — I10 Essential (primary) hypertension: Secondary | ICD-10-CM

## 2010-11-26 DIAGNOSIS — I4581 Long QT syndrome: Secondary | ICD-10-CM | POA: Insufficient documentation

## 2010-11-26 DIAGNOSIS — F411 Generalized anxiety disorder: Secondary | ICD-10-CM

## 2010-11-26 NOTE — Assessment & Plan Note (Signed)
Well controlled.  Continue current medications and low sodium Dash type diet.    

## 2010-11-26 NOTE — Assessment & Plan Note (Signed)
Given age and abnormal ECG will proceed with myovue.  Atypical symptoms

## 2010-11-26 NOTE — Progress Notes (Signed)
Nervous 75 yo with 3 months of atypical sscp.  Referred by [facility].  No previous cardiac issues.  Abnormal ECG with long QT and nonspecific ST/T wave changes.  Indicates feeling in chest not related to exertion.  Palpitations.  Can get so nervous she shakes.  Lives alone and only has a sister she is close to.  CRF's include HTN and elevated lipids on good Rx.  No PND orthopnea or signs of CHF.  NO recent trauma, fibromyalgia or inflamatory diseases.  ROS: Denies fever, malais, weight loss, blurry vision, decreased visual acuity, cough, sputum, SOB, hemoptysis, pleuritic pain, palpitaitons, heartburn, abdominal pain, melena, lower extremity edema, claudication, or rash.  All other systems reviewed and negative   General: Affect appropriate Healthy:  appears stated age HEENT: normal Neck supple with no adenopathy JVP normal no bruits no thyromegaly Lungs clear with no wheezing and good diaphragmatic motion Heart:  S1/S2 no murmur,rub, gallop or click PMI normal Abdomen: benighn, BS positve, no tenderness, no AAA no bruit.  No HSM or HJR Distal pulses intact with no bruits No edema Neuro non-focal Skin warm and dry No muscular weakness  Medications Current Outpatient Prescriptions  Medication Sig Dispense Refill  . ALPRAZolam (XANAX) 0.5 MG tablet Take 0.5 mg by mouth at bedtime as needed.        Marland Kitchen amLODipine (NORVASC) 5 MG tablet Take 5 mg by mouth daily.        Marland Kitchen atorvastatin (LIPITOR) 40 MG tablet Take 40 mg by mouth daily.        . Azelastine HCl (ASTEPRO) 0.15 % SOLN Place into the nose as needed.        . ciclesonide (OMNARIS) 50 MCG/ACT nasal spray Place 2 sprays into both nostrils daily.        . cyanocobalamin 500 MCG tablet Take 500 mcg by mouth daily.        . diclofenac-misoprostol (ARTHROTEC 75) 75-0.2 MG per tablet Take 1 tablet by mouth 2 (two) times daily.        Marland Kitchen escitalopram (LEXAPRO) 5 MG tablet Take 5 mg by mouth daily.        Marland Kitchen gabapentin (NEURONTIN) 300 MG  capsule Take 300 mg by mouth 3 (three) times daily.        Marland Kitchen omeprazole (PRILOSEC) 20 MG capsule Take 20 mg by mouth daily.        Marland Kitchen triamterene-hydrochlorothiazide (MAXZIDE) 75-50 MG per tablet Take 1 tablet by mouth daily.          Allergies Review of patient's allergies indicates no known allergies.  Family History: Family History  Problem Relation Age of Onset  . Colon cancer    . Diabetes      Social History: History   Social History  . Marital Status: Single    Spouse Name: N/A    Number of Children: N/A  . Years of Education: N/A   Occupational History  . Not on file.   Social History Main Topics  . Smoking status: Never Smoker   . Smokeless tobacco: Never Used  . Alcohol Use: Not on file  . Drug Use: Not on file  . Sexually Active: Not on file   Other Topics Concern  . Not on file   Social History Narrative  . No narrative on file    Electrocardiogram:11/05/10  NSR LAE QT 483  nonspecfic ST/T wave changes.  Assessment and Plan

## 2010-11-26 NOTE — Patient Instructions (Signed)
Your physician recommends that you schedule a follow-up appointment in: AS NEEDED Your physician recommends that you continue on your current medications as directed. Please refer to the Current Medication list given to you today. Your physician has requested that you have a lexiscan myoview. For further information please visit [email or web address]. Please follow instruction sheet, as given. DX CHEST PAIN  

## 2010-11-26 NOTE — Assessment & Plan Note (Signed)
Not clinically significant.  Avoid QT prolonging drugs.  Yearly ECG

## 2010-11-26 NOTE — Assessment & Plan Note (Signed)
Probably needs meds changes as she is on 3 agents and still has incapacitating anxiety with somatic components

## 2010-11-26 NOTE — Assessment & Plan Note (Signed)
Cholesterol is at goal.  Continue current dose of statin and diet Rx.  No myalgias or side effects.  F/U  LFT's in 6 months. Lab Results  Component Value Date   LDLCALC 64 03/28/2009

## 2010-12-01 ENCOUNTER — Other Ambulatory Visit: Payer: Self-pay

## 2010-12-01 ENCOUNTER — Emergency Department (HOSPITAL_COMMUNITY): Payer: Medicare Other

## 2010-12-01 ENCOUNTER — Encounter (HOSPITAL_COMMUNITY): Payer: Self-pay

## 2010-12-01 ENCOUNTER — Inpatient Hospital Stay (HOSPITAL_COMMUNITY)
Admission: EM | Admit: 2010-12-01 | Discharge: 2010-12-04 | DRG: 287 | Disposition: A | Discharge status: Home / Self Care | Payer: Medicare Other | Attending: Internal Medicine | Admitting: Internal Medicine

## 2010-12-01 DIAGNOSIS — E7800 Pure hypercholesterolemia, unspecified: Secondary | ICD-10-CM

## 2010-12-01 DIAGNOSIS — H8110 Benign paroxysmal vertigo, unspecified ear: Secondary | ICD-10-CM

## 2010-12-01 DIAGNOSIS — R079 Chest pain, unspecified: Principal | ICD-10-CM | POA: Diagnosis present

## 2010-12-01 DIAGNOSIS — M810 Age-related osteoporosis without current pathological fracture: Secondary | ICD-10-CM

## 2010-12-01 DIAGNOSIS — E669 Obesity, unspecified: Secondary | ICD-10-CM

## 2010-12-01 DIAGNOSIS — E785 Hyperlipidemia, unspecified: Secondary | ICD-10-CM | POA: Diagnosis present

## 2010-12-01 DIAGNOSIS — J309 Allergic rhinitis, unspecified: Secondary | ICD-10-CM

## 2010-12-01 DIAGNOSIS — I4581 Long QT syndrome: Secondary | ICD-10-CM

## 2010-12-01 DIAGNOSIS — R7309 Other abnormal glucose: Secondary | ICD-10-CM

## 2010-12-01 DIAGNOSIS — E119 Type 2 diabetes mellitus without complications: Secondary | ICD-10-CM | POA: Diagnosis present

## 2010-12-01 DIAGNOSIS — I10 Essential (primary) hypertension: Secondary | ICD-10-CM

## 2010-12-01 DIAGNOSIS — R1310 Dysphagia, unspecified: Secondary | ICD-10-CM

## 2010-12-01 DIAGNOSIS — E876 Hypokalemia: Secondary | ICD-10-CM | POA: Diagnosis present

## 2010-12-01 DIAGNOSIS — N812 Incomplete uterovaginal prolapse: Secondary | ICD-10-CM

## 2010-12-01 DIAGNOSIS — K5900 Constipation, unspecified: Secondary | ICD-10-CM

## 2010-12-01 DIAGNOSIS — S43499A Other sprain of unspecified shoulder joint, initial encounter: Secondary | ICD-10-CM

## 2010-12-01 DIAGNOSIS — J329 Chronic sinusitis, unspecified: Secondary | ICD-10-CM

## 2010-12-01 DIAGNOSIS — R5383 Other fatigue: Secondary | ICD-10-CM

## 2010-12-01 DIAGNOSIS — R609 Edema, unspecified: Secondary | ICD-10-CM

## 2010-12-01 DIAGNOSIS — F411 Generalized anxiety disorder: Secondary | ICD-10-CM

## 2010-12-01 DIAGNOSIS — E871 Hypo-osmolality and hyponatremia: Secondary | ICD-10-CM

## 2010-12-01 DIAGNOSIS — M79609 Pain in unspecified limb: Secondary | ICD-10-CM

## 2010-12-01 DIAGNOSIS — I152 Hypertension secondary to endocrine disorders: Secondary | ICD-10-CM | POA: Diagnosis present

## 2010-12-01 DIAGNOSIS — R3129 Other microscopic hematuria: Secondary | ICD-10-CM

## 2010-12-01 DIAGNOSIS — R51 Headache: Secondary | ICD-10-CM

## 2010-12-01 DIAGNOSIS — R109 Unspecified abdominal pain: Secondary | ICD-10-CM

## 2010-12-01 DIAGNOSIS — IMO0002 Reserved for concepts with insufficient information to code with codable children: Secondary | ICD-10-CM

## 2010-12-01 DIAGNOSIS — K222 Esophageal obstruction: Secondary | ICD-10-CM

## 2010-12-01 DIAGNOSIS — Z23 Encounter for immunization: Secondary | ICD-10-CM

## 2010-12-01 DIAGNOSIS — R5381 Other malaise: Secondary | ICD-10-CM

## 2010-12-01 DIAGNOSIS — M1710 Unilateral primary osteoarthritis, unspecified knee: Secondary | ICD-10-CM | POA: Diagnosis present

## 2010-12-01 DIAGNOSIS — M25559 Pain in unspecified hip: Secondary | ICD-10-CM

## 2010-12-01 DIAGNOSIS — E1169 Type 2 diabetes mellitus with other specified complication: Secondary | ICD-10-CM | POA: Diagnosis present

## 2010-12-01 LAB — COMPREHENSIVE METABOLIC PANEL
ALT: 45 U/L — ABNORMAL HIGH (ref 0–35)
AST: 36 U/L (ref 0–37)
Albumin: 4 g/dL (ref 3.5–5.2)
Alkaline Phosphatase: 107 U/L (ref 39–117)
Calcium: 9.5 mg/dL (ref 8.4–10.5)
GFR calc Af Amer: 79 mL/min — ABNORMAL LOW (ref >90)
Glucose, Bld: 109 mg/dL — ABNORMAL HIGH (ref 70–99)
Potassium: 3.6 mEq/L (ref 3.5–5.1)
Sodium: 121 mEq/L — ABNORMAL LOW (ref 135–145)
Total Protein: 7.7 g/dL (ref 6.0–8.3)

## 2010-12-01 LAB — CARDIAC PANEL(CRET KIN+CKTOT+MB+TROPI)
Relative Index: 1.5 (ref 0.0–2.5)
Troponin I: <0.3 ng/mL (ref <0.30)

## 2010-12-01 LAB — POCT I-STAT TROPONIN I

## 2010-12-01 LAB — DIFFERENTIAL
Basophils Absolute: 0.1 10*3/uL (ref 0.0–0.1)
Eosinophils Absolute: 0.1 10*3/uL (ref 0.0–0.7)
Eosinophils Relative: 1 % (ref 0–5)
Lymphs Abs: 2.9 10*3/uL (ref 0.7–4.0)
Neutrophils Relative %: 61 % (ref 43–77)

## 2010-12-01 LAB — CBC
Hemoglobin: 11.7 g/dL — ABNORMAL LOW (ref 12.0–15.0)
MCH: 29 pg (ref 26.0–34.0)
MCH: 28.9 pg (ref 26.0–34.0)
MCHC: 35.9 g/dL (ref 30.0–36.0)
Platelets: 323 10*3/uL (ref 150–400)
Platelets: 327 10*3/uL (ref 150–400)
RBC: 4.04 MIL/uL (ref 3.87–5.11)
RBC: 4.05 MIL/uL (ref 3.87–5.11)
RDW: 14.8 % (ref 11.5–15.5)
WBC: 9.8 10*3/uL (ref 4.0–10.5)

## 2010-12-01 LAB — CREATININE, SERUM
Creatinine, Ser: 0.73 mg/dL (ref 0.50–1.10)
GFR calc non Af Amer: 83 mL/min — ABNORMAL LOW (ref >90)

## 2010-12-01 MED ORDER — AZELASTINE HCL 0.1 % NA SOLN
2.0000 | Freq: Two times a day (BID) | NASAL | Status: DC | PRN
  Filled 2010-12-01: qty 30

## 2010-12-01 MED ORDER — ACETAMINOPHEN 500 MG PO TABS
500.0000 mg | ORAL_TABLET | Freq: Four times a day (QID) | ORAL | Status: DC | PRN
  Filled 2010-12-01: qty 2

## 2010-12-01 MED ORDER — ESCITALOPRAM OXALATE 5 MG PO TABS
5.0000 mg | ORAL_TABLET | Freq: Every day | ORAL | Status: DC
  Administered 2010-12-02 – 2010-12-04 (×3): 5 mg via ORAL
  Filled 2010-12-01 (×3): qty 1

## 2010-12-01 MED ORDER — PANTOPRAZOLE SODIUM 40 MG PO TBEC
40.0000 mg | DELAYED_RELEASE_TABLET | Freq: Every day | ORAL | Status: DC
  Administered 2010-12-01 – 2010-12-03 (×3): 40 mg via ORAL
  Filled 2010-12-01 (×4): qty 1

## 2010-12-01 MED ORDER — DICLOFENAC SODIUM 75 MG PO TBEC
75.0000 mg | DELAYED_RELEASE_TABLET | Freq: Two times a day (BID) | ORAL | Status: DC | PRN
  Filled 2010-12-01: qty 1

## 2010-12-01 MED ORDER — AMLODIPINE BESYLATE 5 MG PO TABS
5.0000 mg | ORAL_TABLET | Freq: Every day | ORAL | Status: DC
  Administered 2010-12-02 – 2010-12-04 (×3): 5 mg via ORAL
  Filled 2010-12-01 (×3): qty 1

## 2010-12-01 MED ORDER — GABAPENTIN 300 MG PO CAPS
300.0000 mg | ORAL_CAPSULE | Freq: Three times a day (TID) | ORAL | Status: DC
  Administered 2010-12-01 – 2010-12-04 (×8): 300 mg via ORAL
  Filled 2010-12-01 (×10): qty 1

## 2010-12-01 MED ORDER — CYANOCOBALAMIN 500 MCG PO TABS
500.0000 ug | ORAL_TABLET | Freq: Every day | ORAL | Status: DC
  Administered 2010-12-02 – 2010-12-04 (×3): 500 ug via ORAL
  Filled 2010-12-01 (×3): qty 1

## 2010-12-01 MED ORDER — MORPHINE SULFATE 2 MG/ML IJ SOLN
1.0000 mg | INTRAMUSCULAR | Status: DC | PRN
  Administered 2010-12-01: 1 mg via INTRAVENOUS
  Filled 2010-12-01: qty 1

## 2010-12-01 MED ORDER — ENOXAPARIN SODIUM 40 MG/0.4ML ~~LOC~~ SOLN
40.0000 mg | Freq: Every day | SUBCUTANEOUS | Status: DC
  Administered 2010-12-01: 40 mg via SUBCUTANEOUS
  Filled 2010-12-01 (×2): qty 0.4

## 2010-12-01 MED ORDER — ACETAMINOPHEN 650 MG RE SUPP: 650.0000 mg | Freq: Four times a day (QID) | RECTAL | Status: DC | PRN

## 2010-12-01 MED ORDER — MISOPROSTOL 200 MCG PO TABS
200.0000 ug | ORAL_TABLET | Freq: Two times a day (BID) | ORAL | Status: DC | PRN
  Filled 2010-12-01: qty 1

## 2010-12-01 MED ORDER — HYDROCODONE-ACETAMINOPHEN 5-325 MG PO TABS: 1.0000 | ORAL_TABLET | ORAL | Status: DC | PRN

## 2010-12-01 MED ORDER — DICLOFENAC-MISOPROSTOL 75-0.2 MG PO TABS: 1.0000 | ORAL_TABLET | Freq: Two times a day (BID) | ORAL | Status: DC | PRN

## 2010-12-01 MED ORDER — ALPRAZOLAM 0.5 MG PO TABS: 0.5000 mg | ORAL_TABLET | Freq: Every day | ORAL | Status: DC | PRN

## 2010-12-01 MED ORDER — CICLESONIDE 50 MCG/ACT NA SUSP: 2.0000 | Freq: Every day | NASAL | Status: DC | PRN

## 2010-12-01 MED ORDER — SODIUM CHLORIDE 0.9 % IV SOLN
INTRAVENOUS | Status: DC
  Administered 2010-12-02: 01:00:00 via INTRAVENOUS

## 2010-12-01 MED ORDER — ROSUVASTATIN CALCIUM 20 MG PO TABS
20.0000 mg | ORAL_TABLET | Freq: Every day | ORAL | Status: DC
  Administered 2010-12-01 – 2010-12-03 (×3): 20 mg via ORAL
  Filled 2010-12-01 (×4): qty 1

## 2010-12-01 NOTE — H&P (Signed)
Casey [name] is an 75 y.o. female.   Chief Complaint: Chest pain HPI: Patient is a 75 year old female  who was seen by Dr. Eden Emms of [facility] last week for evaluation of chest pain and a prolonged QT interval. Patient was scheduled to have a nuclear medicine stress test on the 19th.  She came to the ED today, because she states that she could not wait.   Patient presents to the emergency department today because of ongoing chest pain which she states it is 10/10 at times. Patient states that the pain seems to come and go. It is exertional and she states it gets better with rest. She does get short of breath with it.  She does get nauseated with it.  She does not get sweaty with it. She  states that the pain is substernal and sharp in quality. The pain does not radiate. She states the pain goes away on its own after a while. She cannot define what it while is. Patient also states she has felt extraordinarily fatigued lately and her husband commented that she seems to drink an awful lot of water.  Past Medical History  Diagnosis Date  . HYPERTENSION, BENIGN ESSENTIAL 03/03/2007  . HYPERCHOLESTEROLEMIA   . SPRAIN&STRAIN OTH SPEC SITES SHOULDER&UPPER ARM   . SINUSITIS, RECURRENT   . Problems with swallowing and mastication   . OSTEOPOROSIS   . OSTEOARTHRITIS, KNEE   . OBESITY   . Microscopic hematuria   . LEG EDEMA, BILATERAL   . HYPERGLYCEMIA   . HIP PAIN, LEFT   . Headache   . FLANK PAIN, LEFT   . FATIGUE   . ESOPHAGEAL STRICTURE   . CYSTOCELE WITH INCOMPLETE UTERINE PROLAPSE   . CONSTIPATION   . BENIGN POSITIONAL VERTIGO   . ARM PAIN, RIGHT   . ANXIETY   . ALLERGIC RHINITIS     History reviewed. No pertinent past surgical history.  Family History  Problem Relation Age of Onset  . Colon cancer    . Diabetes    . Coronary artery disease Mother   . Cancer Father   . Coronary artery disease Sister   . Diabetes type II Sister   . Cancer Sister   . Coronary artery  disease Brother   . Coronary artery disease Brother    Social History:  reports that she has never smoked. She has never used smokeless tobacco. She reports that she does not drink alcohol or use illicit drugs. Medications Prior to Admission  Medication Dose Route Frequency Provider Last Rate Last Dose  . 0.9 %  sodium chloride infusion   Intravenous Continuous Nicholes Stairs, MD      . acetaminophen (TYLENOL) suppository 650 mg  650 mg Rectal Q6H PRN Leverett Camplin      . acetaminophen (TYLENOL) tablet 500-1,000 mg  500-1,000 mg Oral Q6H PRN Jennier Schissler      . ALPRAZolam (XANAX) tablet 0.5 mg  0.5 mg Oral Daily PRN Ellesse Antenucci      . amLODipine (NORVASC) tablet 5 mg  5 mg Oral Daily Sitlali Koerner      . Azelastine HCl 0.15 % SOLN 1 spray  1 spray Nasal PRN Hilman Kissling      . ciclesonide (OMNARIS) nasal spray 2 spray  2 spray Each Nare Daily PRN Jeannette Maddy      . cyanocobalamin tablet 500 mcg  500 mcg Oral Daily Denson Niccoli      . diclofenac-misoprostol (ARTHROTEC 75) 75-0.2 MG per  tablet 1 tablet  1 tablet Oral BID PRN Shacoria Latif      . enoxaparin (LOVENOX) injection 40 mg  40 mg Subcutaneous Q24H Krish Bailly      . escitalopram (LEXAPRO) tablet 5 mg  5 mg Oral Daily Antonya Leeder      . gabapentin (NEURONTIN) capsule 300 mg  300 mg Oral TID Keora Eccleston      . HYDROcodone-acetaminophen (NORCO) 5-325 MG per tablet 1-2 tablet  1-2 tablet Oral Q4H PRN Eulice Rutledge      . morphine 2 MG/ML injection 1 mg  1 mg Intravenous Q1H PRN Allyne Hebert      . pantoprazole (PROTONIX) EC tablet 40 mg  40 mg Oral Q1200 Nadia Torr      . rosuvastatin (CRESTOR) tablet 20 mg  20 mg Oral Daily Natasa Stigall       Medications Prior to Admission  Medication Sig Dispense Refill  . ALPRAZolam (XANAX) 0.5 MG tablet Take 0.5 mg by mouth daily as needed. For anxiety      . amLODipine (NORVASC) 5 MG tablet Take 5 mg by mouth daily.        Marland Kitchen atorvastatin (LIPITOR) 40 MG tablet Take 40 mg by mouth every evening.       . Azelastine HCl  (ASTEPRO) 0.15 % SOLN Place 1 spray into the nose as needed. For allergies      . ciclesonide (OMNARIS) 50 MCG/ACT nasal spray Place 2 sprays into both nostrils daily as needed. For allergies      . cyanocobalamin 500 MCG tablet Take 500 mcg by mouth daily.        . diclofenac-misoprostol (ARTHROTEC 75) 75-0.2 MG per tablet Take 1 tablet by mouth 2 (two) times daily as needed. For arthritis      . escitalopram (LEXAPRO) 5 MG tablet Take 5 mg by mouth daily.        Marland Kitchen gabapentin (NEURONTIN) 300 MG capsule Take 300 mg by mouth 3 (three) times daily.        Marland Kitchen omeprazole (PRILOSEC) 20 MG capsule Take 20 mg by mouth daily.          Allergies: No Known Allergies  Constitutional: positive for fatigue and malaise, negative for chills, fevers and sweats Eyes: negative for icterus, irritation, redness and visual disturbance Ears, nose, mouth, throat, and face: negative for earaches, hearing loss, nasal congestion and sore throat Respiratory: negative for asthma, cough, sputum and wheezing Cardiovascular: positive for chest pain, chest pressure/discomfort, dyspnea, exertional chest pressure/discomfort and orthopnea, negative for claudication, irregular heart beat, lower extremity edema and syncope Gastrointestinal: positive for nausea, negative for abdominal pain, constipation, diarrhea and vomiting Genitourinary:positive for dysuria, frequency and urinary incontinence, negative for dysuria, hematuria, hesitancy and urinary incontinence Hematologic/lymphatic: negative for bleeding, easy bruising, lymphadenopathy and petechiae Musculoskeletal:positive for myalgias Neurological: negative for dizziness, gait problems, vertigo and weakness Behavioral/Psych: negative for depression Endocrine: negative for diabetic symptoms including polyphagia, polyuria and weight loss and temperature intolerance   General appearance: alert, cooperative, appears stated age and morbidly obese Head: Normocephalic, without  obvious abnormality, atraumatic Eyes: conjunctivae/corneas clear. PERRL, EOM's intact. Fundi benign. Throat: lips, mucosa, and tongue normal; teeth and gums normal Neck: no adenopathy, no carotid bruit, no JVD, supple, symmetrical, trachea midline and thyroid not enlarged, symmetric, no tenderness/mass/nodules Resp: clear to auscultation bilaterally and normal percussion bilaterally Chest wall: no tenderness Cardio: regular rate and rhythm, S1, S2 normal, no murmur, click, rub or gallop and normal apical impulse GI: soft,  non-tender; bowel sounds normal; no masses,  no organomegaly Extremities: extremities normal, atraumatic, no cyanosis or edema and Homans sign is negative, no sign of DVT Pulses: 2+ and symmetric Skin: Skin color, texture, turgor normal. No rashes or lesions Lymph nodes: Cervical, supraclavicular, and axillary nodes normal. Neurologic: Alert and oriented X 3, normal strength and tone. Normal symmetric reflexes. Normal coordination and gait   Results for orders placed during the hospital encounter of 12/01/10 (from the past 48 hour(s))  CBC     Status: Abnormal   Collection Time   12/01/10  4:31 PM      Component Value Range Comment   WBC 9.8  4.0 - 10.5 (K/uL)    RBC 4.04  3.87 - 5.11 (MIL/uL)    Hemoglobin 11.7 (*) 12.0 - 15.0 (g/dL)    HCT 16.1 (*) 09.6 - 46.0 (%)    MCV 80.7  78.0 - 100.0 (fL)    MCH 29.0  26.0 - 34.0 (pg)    MCHC 35.9  30.0 - 36.0 (g/dL)    RDW 04.5  40.9 - 81.1 (%)    Platelets 323  150 - 400 (K/uL)   DIFFERENTIAL     Status: Normal   Collection Time   12/01/10  4:31 PM      Component Value Range Comment   Neutrophils Relative 61  43 - 77 (%)    Neutro Abs 5.9  1.7 - 7.7 (K/uL)    Lymphocytes Relative 29  12 - 46 (%)    Lymphs Abs 2.9  0.7 - 4.0 (K/uL)    Monocytes Relative 8  3 - 12 (%)    Monocytes Absolute 0.8  0.1 - 1.0 (K/uL)    Eosinophils Relative 1  0 - 5 (%)    Eosinophils Absolute 0.1  0.0 - 0.7 (K/uL)    Basophils Relative 1   0 - 1 (%)    Basophils Absolute 0.1  0.0 - 0.1 (K/uL)   COMPREHENSIVE METABOLIC PANEL     Status: Abnormal   Collection Time   12/01/10  4:31 PM      Component Value Range Comment   Sodium 121 (*) 135 - 145 (mEq/L)    Potassium 3.6  3.5 - 5.1 (mEq/L)    Chloride 81 (*) 96 - 112 (mEq/L)    CO2 26  19 - 32 (mEq/L)    Glucose, Bld 109 (*) 70 - 99 (mg/dL)    BUN 9  6 - 23 (mg/dL)    Creatinine, Ser 9.14  0.50 - 1.10 (mg/dL)    Calcium 9.5  8.4 - 10.5 (mg/dL)    Total Protein 7.7  6.0 - 8.3 (g/dL)    Albumin 4.0  3.5 - 5.2 (g/dL)    AST 36  0 - 37 (U/L)    ALT 45 (*) 0 - 35 (U/L)    Alkaline Phosphatase 107  39 - 117 (U/L)    Total Bilirubin 0.4  0.3 - 1.2 (mg/dL)    GFR calc non Af Amer 68 (*) >90 (mL/min)    GFR calc Af Amer 79 (*) >90 (mL/min)   LIPASE, BLOOD     Status: Normal   Collection Time   12/01/10  4:31 PM      Component Value Range Comment   Lipase 41  11 - 59 (U/L)   POCT I-STAT TROPONIN I     Status: Normal   Collection Time   12/01/10  7:43 PM      Component Value  Range Comment   Troponin i, poc 0.00  0.00 - 0.08 (ng/mL)    Comment 3             @[id number] @  Blood pressure 141/70, pulse 66, temperature 98.5 F (36.9 C), temperature source Oral, resp. rate 19, SpO2 100.00%.    Assessment/Plan 1. Chest Pain - Pt will be ruled out for MI and [facility] will be consulted for evaluation for a stress test.  2. Hyponatremia.  Likely due to volume overload and HCTZ.  Husband states that the patient drinks an awful lot of water at home.  I will also check a HBA1C as pt does have a family history of DM, and she has some symptoms of the disease.  3. Prolonged QT  Has been evaluated by Dr. Eden Emms.  4. HTN.  Will stop diazide as this may have contributed to her hyponatremia.  Will add prn clonidine to control BP.  5. Hyperlipidemia - continue current medications.  6. Anxiety - Continue current medications.  Felton Buczynski 12/01/2010, 8:40 PM

## 2010-12-01 NOTE — ED Notes (Signed)
Called report to [phone] nurse.

## 2010-12-01 NOTE — ED Notes (Signed)
Patient presents with midsternal chest pain since last Monday intermittently with worsening pain today.  Patient denies SOB, n/v. Patient reports pain 9/10.

## 2010-12-01 NOTE — ED Notes (Signed)
Pt states that pain increases with any physical activity and will ease off when resting

## 2010-12-01 NOTE — ED Notes (Signed)
EKG from triage. Placed in pt.'s chart on blue side

## 2010-12-01 NOTE — ED Provider Notes (Signed)
History     CSN: [id number] Arrival date & time: 12/01/2010  2:15 PM   First MD Initiated Contact with Patient 12/01/10 1551      Chief Complaint  Patient presents with  . Chest Pain    (Consider location/radiation/quality/duration/timing/severity/associated sxs/prior treatment) HPI   Past Medical History  Diagnosis Date  . HYPERTENSION, BENIGN ESSENTIAL 03/03/2007  . HYPERCHOLESTEROLEMIA   . SPRAIN&STRAIN OTH SPEC SITES SHOULDER&UPPER ARM   . SINUSITIS, RECURRENT   . Problems with swallowing and mastication   . OSTEOPOROSIS   . OSTEOARTHRITIS, KNEE   . OBESITY   . Microscopic hematuria   . LEG EDEMA, BILATERAL   . HYPERGLYCEMIA   . HIP PAIN, LEFT   . Headache   . FLANK PAIN, LEFT   . FATIGUE   . ESOPHAGEAL STRICTURE   . CYSTOCELE WITH INCOMPLETE UTERINE PROLAPSE   . CONSTIPATION   . BENIGN POSITIONAL VERTIGO   . ARM PAIN, RIGHT   . ANXIETY   . ALLERGIC RHINITIS     History reviewed. No pertinent past surgical history.  Family History  Problem Relation Age of Onset  . Colon cancer    . Diabetes      History  Substance Use Topics  . Smoking status: Never Smoker   . Smokeless tobacco: Never Used  . Alcohol Use: No    OB History    Grav Para Term Preterm Abortions TAB SAB Ect Mult Living                  Review of Systems  Allergies  Review of patient's allergies indicates no known allergies.  Home Medications   Current Outpatient Rx  Name Route Sig Dispense Refill  . ACETAMINOPHEN 500 MG PO TABS Oral Take 500-1,000 mg by mouth every 6 (six) hours as needed. For pain     . ALPRAZOLAM 0.5 MG PO TABS Oral Take 0.5 mg by mouth daily as needed. For anxiety    . AMLODIPINE BESYLATE 5 MG PO TABS Oral Take 5 mg by mouth daily.      . ATORVASTATIN CALCIUM 40 MG PO TABS Oral Take 40 mg by mouth every evening.     . AZELASTINE HCL 0.15 % NA SOLN Nasal Place 1 spray into the nose as needed. For allergies    . CICLESONIDE 50 MCG/ACT NA SUSP Each Nare  Place 2 sprays into both nostrils daily as needed. For allergies    . CYANOCOBALAMIN 500 MCG PO TABS Oral Take 500 mcg by mouth daily.      Marland Kitchen DICLOFENAC-MISOPROSTOL 75-0.2 MG PO TABS Oral Take 1 tablet by mouth 2 (two) times daily as needed. For arthritis    . ESCITALOPRAM OXALATE 5 MG PO TABS Oral Take 5 mg by mouth daily.      Marland Kitchen GABAPENTIN 300 MG PO CAPS Oral Take 300 mg by mouth 3 (three) times daily.      Marland Kitchen OMEPRAZOLE 20 MG PO CPDR Oral Take 20 mg by mouth daily.      . TRIAMTERENE-HCTZ 75-50 MG PO TABS Oral Take 1 tablet by mouth daily.       BP 141/70  Pulse 66  Temp(Src) 98.5 F (36.9 C) (Oral)  Resp 19  SpO2 100%  Physical Exam  ED Course  Procedures (including critical care time)  75 year old, female with a history of hypertension, and no coronary artery disease percent with intermittent chest pain for several days.  There are no other associated symptoms consistent with ACS.  Her EKG does not show any signs of cardiac ischemia.  We'll perform a chest x-ray, and blood testing, to evaluate for cardiopulmonary disease.  She is asymptomatic at this time, so.  No medical therapy is indicated.  8:02 PM I spoke with the [facility] hospitalist, Dr. Gerri Lins.  She will calm evaluate the patient for hyponatremia and chest pain, and admit her to the hospital.  Labs Reviewed  CBC - Abnormal; Notable for the following:    Hemoglobin 11.7 (*)    HCT 32.6 (*)    All other components within normal limits  COMPREHENSIVE METABOLIC PANEL - Abnormal; Notable for the following:    Sodium 121 (*)    Chloride 81 (*)    Glucose, Bld 109 (*)    ALT 45 (*)    GFR calc non Af Amer 68 (*)    GFR calc Af Amer 79 (*)    All other components within normal limits  DIFFERENTIAL  LIPASE, BLOOD  I-STAT TROPONIN I   Dg Chest Port 1 View  12/01/2010  *RADIOLOGY REPORT*  Clinical Data: Chest pain.  PORTABLE CHEST - 1 VIEW  Comparison: None.  Findings: Lungs are clear.  Heart size is normal.  No  pneumothorax or pleural effusion.  IMPRESSION: No acute disease.  Original Report Authenticated By: Bernadene Bell. Maricela Curet, M.D.     No diagnosis found.    MDM  Severe hyponatremia, with a sodium of 121.  Noncardiac chest pain.        Nicholes Stairs, MD 12/01/10 2003

## 2010-12-01 NOTE — ED Notes (Signed)
Pain in chest for one month in sternal area no other related symptoms no respiratory or acute distress noted alert and oriented x 3 call light in reach.

## 2010-12-02 ENCOUNTER — Encounter (HOSPITAL_COMMUNITY)
Admission: EM | Disposition: A | Discharge status: Home / Self Care | Payer: Self-pay | Source: Home / Self Care | Attending: Internal Medicine

## 2010-12-02 ENCOUNTER — Encounter (HOSPITAL_COMMUNITY): Payer: Self-pay

## 2010-12-02 DIAGNOSIS — R079 Chest pain, unspecified: Secondary | ICD-10-CM

## 2010-12-02 HISTORY — PX: LEFT HEART CATHETERIZATION WITH CORONARY ANGIOGRAM: SHX5451

## 2010-12-02 LAB — COMPREHENSIVE METABOLIC PANEL
Albumin: 3.5 g/dL (ref 3.5–5.2)
BUN: 6 mg/dL (ref 6–23)
Calcium: 8.9 mg/dL (ref 8.4–10.5)
Creatinine, Ser: 0.67 mg/dL (ref 0.50–1.10)
GFR calc Af Amer: >90 mL/min (ref >90)
Glucose, Bld: 107 mg/dL — ABNORMAL HIGH (ref 70–99)
Potassium: 3.3 mEq/L — ABNORMAL LOW (ref 3.5–5.1)
Total Protein: 6.5 g/dL (ref 6.0–8.3)

## 2010-12-02 LAB — DIFFERENTIAL
Basophils Absolute: 0 10*3/uL (ref 0.0–0.1)
Eosinophils Relative: 1 % (ref 0–5)
Lymphocytes Relative: 36 % (ref 12–46)
Lymphs Abs: 2.5 10*3/uL (ref 0.7–4.0)
Monocytes Absolute: 0.6 10*3/uL (ref 0.1–1.0)
Monocytes Relative: 8 % (ref 3–12)
Neutro Abs: 3.9 10*3/uL (ref 1.7–7.7)

## 2010-12-02 LAB — CBC
HCT: 30.4 % — ABNORMAL LOW (ref 36.0–46.0)
Hemoglobin: 10.7 g/dL — ABNORMAL LOW (ref 12.0–15.0)
MCV: 81.1 fL (ref 78.0–100.0)
RBC: 3.75 MIL/uL — ABNORMAL LOW (ref 3.87–5.11)
RDW: 15.1 % (ref 11.5–15.5)
WBC: 7.1 10*3/uL (ref 4.0–10.5)

## 2010-12-02 LAB — HEMOGLOBIN A1C
Hgb A1c MFr Bld: 6.6 % — ABNORMAL HIGH (ref <5.7)
Mean Plasma Glucose: 143 mg/dL — ABNORMAL HIGH (ref <117)

## 2010-12-02 LAB — GLUCOSE, CAPILLARY
Glucose-Capillary: 119 mg/dL — ABNORMAL HIGH (ref 70–99)
Glucose-Capillary: 136 mg/dL — ABNORMAL HIGH (ref 70–99)

## 2010-12-02 LAB — CARDIAC PANEL(CRET KIN+CKTOT+MB+TROPI)
Total CK: 459 U/L — ABNORMAL HIGH (ref 7–177)
Troponin I: <0.3 ng/mL (ref <0.30)
Troponin I: <0.3 ng/mL (ref <0.30)

## 2010-12-02 LAB — PROTIME-INR: Prothrombin Time: 14.2 seconds (ref 11.6–15.2)

## 2010-12-02 SURGERY — LEFT HEART CATHETERIZATION WITH CORONARY ANGIOGRAM
Anesthesia: LOCAL

## 2010-12-02 MED ORDER — LIDOCAINE HCL (PF) 1 % IJ SOLN
INTRAMUSCULAR | Status: AC
  Filled 2010-12-02: qty 30

## 2010-12-02 MED ORDER — SODIUM CHLORIDE 0.9 % IJ SOLN
3.0000 mL | Freq: Two times a day (BID) | INTRAMUSCULAR | Status: DC
  Administered 2010-12-02: 3 mL via INTRAVENOUS

## 2010-12-02 MED ORDER — VERAPAMIL HCL 2.5 MG/ML IV SOLN
INTRAVENOUS | Status: AC
  Filled 2010-12-02: qty 2

## 2010-12-02 MED ORDER — DIAZEPAM 5 MG PO TABS
5.0000 mg | ORAL_TABLET | ORAL | Status: AC
  Administered 2010-12-02: 5 mg via ORAL
  Filled 2010-12-02: qty 1

## 2010-12-02 MED ORDER — HEPARIN SODIUM (PORCINE) 1000 UNIT/ML IJ SOLN
INTRAMUSCULAR | Status: AC
  Filled 2010-12-02: qty 1

## 2010-12-02 MED ORDER — ASPIRIN 81 MG PO CHEW: 324.0000 mg | CHEWABLE_TABLET | ORAL | Status: DC

## 2010-12-02 MED ORDER — FENTANYL CITRATE 0.05 MG/ML IJ SOLN
INTRAMUSCULAR | Status: AC
  Filled 2010-12-02: qty 2

## 2010-12-02 MED ORDER — INSULIN ASPART 100 UNIT/ML ~~LOC~~ SOLN
0.0000 [IU] | Freq: Three times a day (TID) | SUBCUTANEOUS | Status: DC
  Administered 2010-12-02 – 2010-12-04 (×3): 2 [IU] via SUBCUTANEOUS
  Filled 2010-12-02: qty 3

## 2010-12-02 MED ORDER — ASPIRIN 81 MG PO CHEW
324.0000 mg | CHEWABLE_TABLET | Freq: Once | ORAL | Status: AC
  Administered 2010-12-02: 324 mg via ORAL
  Filled 2010-12-02: qty 4

## 2010-12-02 MED ORDER — MIDAZOLAM HCL 2 MG/2ML IJ SOLN
INTRAMUSCULAR | Status: AC
  Filled 2010-12-02: qty 2

## 2010-12-02 MED ORDER — POTASSIUM CHLORIDE CRYS ER 20 MEQ PO TBCR
40.0000 meq | EXTENDED_RELEASE_TABLET | Freq: Once | ORAL | Status: AC
  Administered 2010-12-02: 40 meq via ORAL
  Filled 2010-12-02: qty 2

## 2010-12-02 MED ORDER — INSULIN ASPART 100 UNIT/ML ~~LOC~~ SOLN: 0.0000 [IU] | Freq: Every day | SUBCUTANEOUS | Status: DC

## 2010-12-02 MED ORDER — SODIUM CHLORIDE 0.9 % IJ SOLN: 3.0000 mL | INTRAMUSCULAR | Status: DC | PRN

## 2010-12-02 MED ORDER — SODIUM CHLORIDE 0.9 % IV SOLN: 1.0000 mL/kg/h | INTRAVENOUS | Status: DC

## 2010-12-02 MED ORDER — HEPARIN (PORCINE) IN NACL 2-0.9 UNIT/ML-% IJ SOLN
INTRAMUSCULAR | Status: AC
  Filled 2010-12-02: qty 2000

## 2010-12-02 MED ORDER — SODIUM CHLORIDE 0.9 % IV SOLN
1.0000 mL/kg/h | INTRAVENOUS | Status: DC
  Administered 2010-12-02: 1 mL/kg/h via INTRAVENOUS

## 2010-12-02 MED ORDER — NITROGLYCERIN 0.2 MG/ML ON CALL CATH LAB
INTRAVENOUS | Status: AC
  Filled 2010-12-02: qty 1

## 2010-12-02 NOTE — Interval H&P Note (Signed)
History and Physical Interval Note:   12/02/2010   5:26 PM   Casey [name]  has presented today for surgery, with the diagnosis of Chest Pain  The various methods of treatment have been discussed with the patient and family. After consideration of risks, benefits and other options for treatment, the patient has consented to  Procedure(s): LEFT HEART CATHETERIZATION WITH CORONARY ANGIOGRAM as a surgical intervention .  The patients' history has been reviewed, patient examined, no change in status, stable for surgery.  I have reviewed the patients' chart and labs.  Questions were answered to the patient's satisfaction.     Aarilyn Dye [place]  MD

## 2010-12-02 NOTE — Op Note (Signed)
Cardiac Catheterization Procedure Note  Name: Casey [name] MRN: [id number] DOB: 1935-06-18  Procedure: Left Heart Cath, Selective Coronary Angiography, LV angiography  Indication: 75 year old black female who presents with refractory chest pain. Total CPKs are elevated but other cardiac enzymes are normal.   Procedural Details: The right wrist was prepped, draped, and anesthetized with 1% lidocaine. Using the modified Seldinger technique, a 5 French sheath was introduced into the right radial artery. 3 mg of verapamil was administered through the sheath, weight-based unfractionated heparin was administered intravenously. Standard Judkins catheters were used for selective coronary angiography and left ventriculography. Catheter exchanges were performed over an exchange length guidewire. There were no immediate procedural complications. A TR band was used for radial hemostasis at the completion of the procedure.  The patient was transferred to the post catheterization recovery area for further monitoring.  Procedural Findings: Hemodynamics: AO 134/64 with a mean of 96 mmHg LV 138 with an EDP of 22  Coronary angiography: Coronary dominance: right  Left mainstem: Normal  Left anterior descending (LAD): Normal  Left circumflex (LCx): Normal  Right coronary artery (RCA): Normal  Left ventriculography: Left ventricular systolic function is normal, LVEF is estimated at 65%, there is no significant mitral regurgitation   Final Conclusions:    1. Normal coronary anatomy.  2. Normal left ventricular function.  Recommendations: Evaluate for noncardiac causes of chest pain.  Peter [place] 12/02/2010, 5:58 PM

## 2010-12-02 NOTE — H&P (View-Only) (Signed)
Subjective: No events overnight. Having occaisonal chest pain on exertion, while going to the bathroom.  Not related to food/position.  No shortness of breath.  Objective:  Vital signs in last 24 hours:  Filed Vitals:   12/02/10 0600 12/02/10 0700 12/02/10 0745 12/02/10 0800  BP:    121/59  Pulse: 69 65  63  Temp:   98.2 F (36.8 C)   TempSrc:      Resp: 12 13  13   Height:      Weight:      SpO2: 98% 99%  99%    Intake/Output from previous day:   Intake/Output Summary (Last 24 hours) at 12/02/10 0836 Last data filed at 12/02/10 0800  Gross per 24 hour  Intake   1520 ml  Output   1675 ml  Net   -155 ml    Physical Exam: General: Alert, awake, oriented x3, in no acute distress. HEENT: No bruits, no goiter. Heart: Regular rate and rhythm, without murmurs, rubs, gallops. Lungs: Clear to auscultation bilaterally. Abdomen: Soft, nontender, nondistended, positive bowel sounds. Extremities: No clubbing cyanosis or edema with positive pedal pulses. Neuro: Grossly intact, nonfocal.   Lab Results:  Basic Metabolic Panel:    Component Value Date/Time   NA 125* 12/02/2010 0530   K 3.3* 12/02/2010 0530   CL 89* 12/02/2010 0530   CO2 27 12/02/2010 0530   BUN 6 12/02/2010 0530   CREATININE 0.67 12/02/2010 0530   GLUCOSE 107* 12/02/2010 0530   CALCIUM 8.9 12/02/2010 0530   CBC:    Component Value Date/Time   WBC 7.1 12/02/2010 0530   HGB 10.7* 12/02/2010 0530   HCT 30.4* 12/02/2010 0530   PLT 323 12/02/2010 0530   MCV 81.1 12/02/2010 0530   NEUTROABS 3.9 12/02/2010 0530   LYMPHSABS 2.5 12/02/2010 0530   MONOABS 0.6 12/02/2010 0530   EOSABS 0.1 12/02/2010 0530   BASOSABS 0.0 12/02/2010 0530    Recent Results (from the past 240 hour(s))  MRSA PCR SCREENING     Status: Normal   Collection Time   12/01/10 10:14 PM      Component Value Range Status Comment   MRSA by PCR NEGATIVE  NEGATIVE  Final     Studies/Results: Dg Chest Port 1 View  12/01/2010   *RADIOLOGY REPORT*  Clinical Data: Chest pain.  PORTABLE CHEST - 1 VIEW  Comparison: None.  Findings: Lungs are clear.  Heart size is normal.  No pneumothorax or pleural effusion.  IMPRESSION: No acute disease.  Original Report Authenticated By: Bernadene Bell. Maricela Curet, M.D.    Medications: Scheduled Meds:   . amLODipine  5 mg Oral Daily  . cyanocobalamin  500 mcg Oral Daily  . enoxaparin  40 mg Subcutaneous QHS  . escitalopram  5 mg Oral Daily  . gabapentin  300 mg Oral TID  . insulin aspart  0-15 Units Subcutaneous TID WC  . insulin aspart  0-5 Units Subcutaneous QHS  . pantoprazole  40 mg Oral Q1200  . potassium chloride  40 mEq Oral Once  . rosuvastatin  20 mg Oral q1800   Continuous Infusions:   . DISCONTD: sodium chloride 125 mL/hr at 12/02/10 0700   PRN Meds:.acetaminophen, acetaminophen, ALPRAZolam, azelastine, diclofenac, HYDROcodone-acetaminophen, misoprostol, morphine, DISCONTD: ciclesonide, DISCONTD: diclofenac-misoprostol  Assessment/Plan:  Principal Problem:  *Chest pain Active Problems:  HYPERCHOLESTEROLEMIA  ANXIETY  HYPERTENSION, BENIGN ESSENTIAL  ESOPHAGEAL STRICTURE  HYPERGLYCEMIA  Long Q-T syndrome  Hyponatremia   Plan: Her chest pain is concerning since it is brought  on by exertion and relieved with rest.  She has not had any prior cardiac testing, nor does she have any known coronary disease.  Will ask LB cards to see in consult for possible further stress test.  Her hyponatremia is likely secondary to her thiazide.  We will hold her thiazide.  Her sodium is currently improving, cont to monitor  Patient likely has pre-diabetes with fasting blood sugar 100-130.  Will place on sliding scale insulin.  May benefit from metformin on d/c  Hyperlipidemia on crestor  Anxiety, may be playing a role in her chest pain, but she does not describe any chest pain at rest.   LOS: 1 day   Maisley Hainsworth 12/02/2010, 8:36 AM

## 2010-12-02 NOTE — Progress Notes (Signed)
Patient lives alone, patient for cath today, NCM will continue to follow for further dc needs.

## 2010-12-02 NOTE — Progress Notes (Signed)
Subjective: No events overnight. Having occaisonal chest pain on exertion, while going to the bathroom.  Not related to food/position.  No shortness of breath.  Objective:  Vital signs in last 24 hours:  Filed Vitals:   12/02/10 0600 12/02/10 0700 12/02/10 0745 12/02/10 0800  BP:    121/59  Pulse: 69 65  63  Temp:   98.2 F (36.8 C)   TempSrc:      Resp: 12 13  13  Height:      Weight:      SpO2: 98% 99%  99%    Intake/Output from previous day:   Intake/Output Summary (Last 24 hours) at 12/02/10 0836 Last data filed at 12/02/10 0800  Gross per 24 hour  Intake   1520 ml  Output   1675 ml  Net   -155 ml    Physical Exam: General: Alert, awake, oriented x3, in no acute distress. HEENT: No bruits, no goiter. Heart: Regular rate and rhythm, without murmurs, rubs, gallops. Lungs: Clear to auscultation bilaterally. Abdomen: Soft, nontender, nondistended, positive bowel sounds. Extremities: No clubbing cyanosis or edema with positive pedal pulses. Neuro: Grossly intact, nonfocal.   Lab Results:  Basic Metabolic Panel:    Component Value Date/Time   NA 125* 12/02/2010 0530   K 3.3* 12/02/2010 0530   CL 89* 12/02/2010 0530   CO2 27 12/02/2010 0530   BUN 6 12/02/2010 0530   CREATININE 0.67 12/02/2010 0530   GLUCOSE 107* 12/02/2010 0530   CALCIUM 8.9 12/02/2010 0530   CBC:    Component Value Date/Time   WBC 7.1 12/02/2010 0530   HGB 10.7* 12/02/2010 0530   HCT 30.4* 12/02/2010 0530   PLT 323 12/02/2010 0530   MCV 81.1 12/02/2010 0530   NEUTROABS 3.9 12/02/2010 0530   LYMPHSABS 2.5 12/02/2010 0530   MONOABS 0.6 12/02/2010 0530   EOSABS 0.1 12/02/2010 0530   BASOSABS 0.0 12/02/2010 0530    Recent Results (from the past 240 hour(s))  MRSA PCR SCREENING     Status: Normal   Collection Time   12/01/10 10:14 PM      Component Value Range Status Comment   MRSA by PCR NEGATIVE  NEGATIVE  Final     Studies/Results: Dg Chest Port 1 View  12/01/2010   *RADIOLOGY REPORT*  Clinical Data: Chest pain.  PORTABLE CHEST - 1 VIEW  Comparison: None.  Findings: Lungs are clear.  Heart size is normal.  No pneumothorax or pleural effusion.  IMPRESSION: No acute disease.  Original Report Authenticated By: THOMAS L. D'ALESSIO, M.D.    Medications: Scheduled Meds:   . amLODipine  5 mg Oral Daily  . cyanocobalamin  500 mcg Oral Daily  . enoxaparin  40 mg Subcutaneous QHS  . escitalopram  5 mg Oral Daily  . gabapentin  300 mg Oral TID  . insulin aspart  0-15 Units Subcutaneous TID WC  . insulin aspart  0-5 Units Subcutaneous QHS  . pantoprazole  40 mg Oral Q1200  . potassium chloride  40 mEq Oral Once  . rosuvastatin  20 mg Oral q1800   Continuous Infusions:   . DISCONTD: sodium chloride 125 mL/hr at 12/02/10 0700   PRN Meds:.acetaminophen, acetaminophen, ALPRAZolam, azelastine, diclofenac, HYDROcodone-acetaminophen, misoprostol, morphine, DISCONTD: ciclesonide, DISCONTD: diclofenac-misoprostol  Assessment/Plan:  Principal Problem:  *Chest pain Active Problems:  HYPERCHOLESTEROLEMIA  ANXIETY  HYPERTENSION, BENIGN ESSENTIAL  ESOPHAGEAL STRICTURE  HYPERGLYCEMIA  Long Q-T syndrome  Hyponatremia   Plan: Her chest pain is concerning since it is brought   on by exertion and relieved with rest.  She has not had any prior cardiac testing, nor does she have any known coronary disease.  Will ask LB cards to see in consult for possible further stress test.  Her hyponatremia is likely secondary to her thiazide.  We will hold her thiazide.  Her sodium is currently improving, cont to monitor  Patient likely has pre-diabetes with fasting blood sugar 100-130.  Will place on sliding scale insulin.  May benefit from metformin on d/c  Hyperlipidemia on crestor  Anxiety, may be playing a role in her chest pain, but she does not describe any chest pain at rest.   LOS: 1 day   Casey [name] 12/02/2010, 8:36 AM   

## 2010-12-02 NOTE — Plan of Care (Signed)
Problem: Consults Goal: Diabetes Guidelines if Diabetic/Glucose > 140 If diabetic or lab glucose is > 140 mg/dl - Initiate Diabetes/Hyperglycemia Guidelines & Document Interventions  Outcome: Not Applicable Date Met:  12/02/10 CBG 119   Problem: Phase I Progression Outcomes Goal: Aspirin unless contraindicated Outcome: Progressing Pt received ASA 325 by EMS.

## 2010-12-02 NOTE — Progress Notes (Signed)
Pt with no History of DM mentioned in PMH.  A1C was 6.6% this admission.  Strong family history of DM.  Spoke with Dr. Kerry Hough.  Dr. Kerry Hough suspects "pre-diabetes".  May start pt on Metformin at d/c with close follow-up with pt's primary MD, Dr. Philipp Deputy.  Encouraged pt to follow-up with Dr. Philipp Deputy within 3 months after d/c.  Gave pt info on pre-diabetes, risk factors for Type 2 DM.  RN aware.  Will follow.

## 2010-12-02 NOTE — Progress Notes (Signed)
  Echocardiogram 2D Echocardiogram has been performed.  Casey [name] R 12/02/2010, 10:07 AM

## 2010-12-02 NOTE — Progress Notes (Signed)
Deflated Per Protocol:   yes  Time Band Off / Dressing Applied:  2030   Site Upon Arrival:  Level 0  Site After Band Removal:  Level 0  Reverse Allen's Test:    positive  COMMENTS:

## 2010-12-02 NOTE — Consult Note (Signed)
CARDIOLOGY CONSULT NOTE  Patient ID: Casey [name] MRN: [id number] DOB/AGE: [birth year] 75 y.o.  Admit date: 12/01/2010 Referring Physician  Ava Swayze Primary Physician  [facility] Primary Cardiologist  Nishan Reason for Consultation  Chest pain  HPI:Casey [name] is a 75 y.o. female with a history of HTN, hyperlipidemia, prolonged QT interval, pre-diabetes and anxiety, who presented to the emergency room on 12/01/10 with complaints of worsening chest pain.  The chest pain is described as being sharp and and at times is a 10/10 and is diffusely spread across her chest.  There is no radiation of the pain. She also has some nausea and shortness of breath with the pain.  There has also been some increased fatigue noted by her husband, along with increased thirst.  The chest pain episode starting to occur on Sunday and continued to get worse.  It was occurring at rest and feels similar to the pain she had last year with a muscle strain in her chest from lifting too much at work.  She does, however, state that there is some chest pain with exertion at time and this is relieved by rest.    She saw Dr. Eden Emms last week for her chest pain and was scheduled to have a lexiscan only myoview on 11/19, however, she felt she could not wait that long and that the chest pain was worsening.    Pt was taking HCTZ prior to admission, but was stopped due to her hyponatremia.  She Her troponin was initially <0.30 and has remained that way.  Her CK was 566 and now 459, along with her CM-MB at 8.3 on arrival and 7.6 on last labs.  A third set has not been completed.    She is still hyponatremic (125) and hypokalemic (3.3)    Past Medical History  Diagnosis Date  . HYPERTENSION, BENIGN ESSENTIAL 03/03/2007  . HYPERCHOLESTEROLEMIA   . SPRAIN&STRAIN OTH SPEC SITES SHOULDER&UPPER ARM   . SINUSITIS, RECURRENT   . Problems with swallowing and mastication   . OSTEOPOROSIS   . OSTEOARTHRITIS, KNEE   . OBESITY   .  Microscopic hematuria   . LEG EDEMA, BILATERAL   . HYPERGLYCEMIA   . HIP PAIN, LEFT   . Headache   . FLANK PAIN, LEFT   . FATIGUE   . ESOPHAGEAL STRICTURE   . CYSTOCELE WITH INCOMPLETE UTERINE PROLAPSE   . CONSTIPATION   . BENIGN POSITIONAL VERTIGO   . ARM PAIN, RIGHT   . ANXIETY   . ALLERGIC RHINITIS     History reviewed. No pertinent past surgical history.  No Known Allergies Medications: I have reviewed the patient's current medications. Scheduled:   . amLODipine  5 mg Oral Daily  . cyanocobalamin  500 mcg Oral Daily  . enoxaparin  40 mg Subcutaneous QHS  . escitalopram  5 mg Oral Daily  . gabapentin  300 mg Oral TID  . insulin aspart  0-15 Units Subcutaneous TID WC  . insulin aspart  0-5 Units Subcutaneous QHS  . pantoprazole  40 mg Oral Q1200  . potassium chloride  40 mEq Oral Once  . rosuvastatin  20 mg Oral q1800    History   Social History  . Marital Status: Single    Spouse Name: N/A    Number of Children: N/A  . Years of Education: N/A   Occupational History  . Not on file.   Social History Main Topics  . Smoking status: Never Smoker   . Smokeless tobacco:  Never Used  . Alcohol Use: No  . Drug Use: No  . Sexually Active: Not Currently   Other Topics Concern  . Not on file   Social History Narrative  . No narrative on file    Family History  Problem Relation Age of Onset  . Colon cancer    . Diabetes    . Coronary artery disease Mother   . Cancer Father   . Coronary artery disease Sister   . Diabetes type II Sister   . Cancer Sister   . Coronary artery disease Brother   . Coronary artery disease Brother     Review of Systems: Full 14 point review of systems complete and found to be negative unless listed  above  Physical Exam: Blood pressure 121/59, pulse 63, temperature 98.2 F (36.8 C), temperature source Oral, resp. rate 13, height 5\' 2"  (1.575 m), weight 190 lb 14.7 oz (86.6 kg), SpO2 99.00%.   General: Well developed,  overweight female, in no acute distress Head: Normocephalic and atraumatic Lungs: Clear bilaterally to auscultation and percussion. Heart: HRRR S1 S2, without R/G.  1/6 systolic murmur over URSB.  Pulses are 2+ & equal all 4 extrem.   Neck:  No carotid bruit. No JVD. No lymphadenopathy Abdomen: Bowel sounds present, abdomen soft and non-tender without masses  Msk:  Tenderness upon palpation over intercostal areas and over pectoralis muscles, especially on left side, with palpation. Extremities: No clubbing, cyanosis or edema.  Neuro: Alert and oriented X 3. No focal deficits noted. Psych:  Good affect, responds appropriately Skin :  No rashes or lesions noted. Labs:  [facility] 12/02/10 0530 12/01/10 2046 12/01/10 1631  NA 125* -- 121*  K 3.3* -- 3.6  CL 89* -- 81*  CO2 27 -- 26  GLUCOSE 107* -- 109*  BUN 6 -- 9  CREATININE 0.67 0.73 --  CALCIUM 8.9 -- 9.5  MG -- -- --  PHOS -- -- --    Basename 12/02/10 0530 12/01/10 1631  AST 28 36  ALT 35 45*  ALKPHOS 93 107  BILITOT 0.4 0.4  PROT 6.5 7.7  ALBUMIN 3.5 4.0    Basename 12/01/10 1631  LIPASE 41  AMYLASE --    Basename 12/02/10 0530 12/01/10 2046 12/01/10 1631  WBC 7.1 9.2 --  NEUTROABS 3.9 -- 5.9  HGB 10.7* 11.7* --  HCT 30.4* 32.6* --  MCV 81.1 80.5 --  PLT 323 327 --    Basename 12/02/10 0922 12/02/10 0218 12/01/10 2046  CKTOTAL 510* 459* 566*  CKMB 7.8* 7.6* 8.3*  CKMBINDEX 1.5 1.7 1.5  TROPONINI <0.30 <0.30 <0.30    Radiology: CXR: No acute disease; lungs clear and heart size is normal. EKG:  Normal sinus rhythm ECHO:  Completed 12/02/10 and results pending  ASSESSMENT AND PLAN:    The patient was seen today by Dr Patty Sermons, MD, the patient evaluated and the data reviewed. 1. Chest Pain  - Based on exertional chest pain and alleviation with rest, per pt, I feel a myoview would be positive and we would be   Considering a catheterization at that point.  That being said, I feel a cath would be reasonable  to consider based on  risk factors, symptoms, and cardiac markers.  This should be scheduled in-patient.  - Consider nitroglycerin for chest pain, however blood pressure needs to be monitored as diastolic is in the 40's and  50's and systolic is in the 110's to 120's.  2. Hyponatremia  - is  improving, per primary MD 3. Hypokalemia  - Replete with oral potassium supplements. 4. Elevated Cardiac Markers/ ?NSTEMI? - with a normal index on CK-MB and negative troponins, do not feel that she has ruled in for a NSTEMI. See #1. 5. ?Costochondritis?   Signed: Theodore Demark 12/02/2010, 10:15 AM Co-Sign MD Atttending: History reviewed with patient.  Chest pain is atypical and EKG is nonspecific.  She does have risk factors for CAD.  Patient exam reveals clear llungs and heart reveals soft ejection murmur at left sternal edge.  All available labs, radiology testing, previous records reviewed.  Will proceed with cath this afternoon.  Hypokalemia being corrected and fluid restriction for dilutional hyponatremia.  12/02/2010, 11:54 AM, Cassell Clement

## 2010-12-03 LAB — BASIC METABOLIC PANEL
BUN: 9 mg/dL (ref 6–23)
Calcium: 10 mg/dL (ref 8.4–10.5)
Chloride: 98 mEq/L (ref 96–112)
Creatinine, Ser: 0.77 mg/dL (ref 0.50–1.10)
GFR calc Af Amer: >90 mL/min (ref >90)

## 2010-12-03 LAB — GLUCOSE, CAPILLARY
Glucose-Capillary: 102 mg/dL — ABNORMAL HIGH (ref 70–99)
Glucose-Capillary: 121 mg/dL — ABNORMAL HIGH (ref 70–99)

## 2010-12-03 LAB — CBC
HCT: 30.5 % — ABNORMAL LOW (ref 36.0–46.0)
MCHC: 34.4 g/dL (ref 30.0–36.0)
MCV: 82.4 fL (ref 78.0–100.0)
Platelets: 325 10*3/uL (ref 150–400)
RDW: 15.6 % — ABNORMAL HIGH (ref 11.5–15.5)

## 2010-12-03 MED ORDER — LIVING WELL WITH DIABETES BOOK
Freq: Once | Status: AC
  Administered 2010-12-03: 11:00:00
  Filled 2010-12-03: qty 1

## 2010-12-03 NOTE — Progress Notes (Signed)
Pt with new onset DM.  A1C 6.6%.  Ordered Living well with DM book from pharmacy for pt.  Ordered basic DM instruction for pt by RN.  Spoke with pt about new diagnosis.  Pt very open and receptive to information.  Ordered RD consult for DM diet education.  Encouraged pt to follow-up with her primary MD, Dr. Philipp Deputy after d/c.    MD- Please order Outpatient DM education for pt MD- Please make sure to give pt a prescription for home blood glucose meter at d/c  Thank you. Edrick Oh RN, MSN, CDE

## 2010-12-03 NOTE — Progress Notes (Signed)
SUBJECTIVE:  No chest pain   PHYSICAL EXAM Filed Vitals:   12/02/10 2050 12/02/10 2100 12/03/10 0021 12/03/10 0607  BP:  122/62 120/60 127/61  Pulse: 73 73 65 73  Temp:   98.1 F (36.7 C) 98.7 F (37.1 C)  TempSrc:   Oral Oral  Resp:   20 20  Height:      Weight:      SpO2: 100% 100% 99% 100%   General:  No distress Lungs:  Clear Heart:  RRR, no murmurs Abdomen:  .obese Extremities:  Right wrist without erythema, hematoma.    LABS: Lab Results  Component Value Date   CKTOTAL 510* 12/02/2010   CKMB 7.8* 12/02/2010   TROPONINI <0.30 12/02/2010   Results for orders placed during the hospital encounter of 12/01/10 (from the past 24 hour(s))  CARDIAC PANEL(CRET KIN+CKTOT+MB+TROPI)     Status: Abnormal   Collection Time   12/02/10  9:22 AM      Component Value Range   Total CK 510 (*) 7 - 177 (U/L)   CK, MB 7.8 (*) 0.3 - 4.0 (ng/mL)   Troponin I <0.30  <0.30 (ng/mL)   Relative Index 1.5  0.0 - 2.5   GLUCOSE, CAPILLARY     Status: Abnormal   Collection Time   12/02/10 12:06 PM      Component Value Range   Glucose-Capillary 121 (*) 70 - 99 (mg/dL)  PROTIME-INR     Status: Normal   Collection Time   12/02/10  1:36 PM      Component Value Range   Prothrombin Time 14.2  11.6 - 15.2 (seconds)   INR 1.08  0.00 - 1.49   GLUCOSE, CAPILLARY     Status: Abnormal   Collection Time   12/02/10  4:24 PM      Component Value Range   Glucose-Capillary 108 (*) 70 - 99 (mg/dL)  GLUCOSE, CAPILLARY     Status: Abnormal   Collection Time   12/02/10 10:14 PM      Component Value Range   Glucose-Capillary 136 (*) 70 - 99 (mg/dL)   Comment 1 Notify RN     Comment 2 Documented in Chart    BASIC METABOLIC PANEL     Status: Abnormal   Collection Time   12/03/10  4:18 AM      Component Value Range   Sodium 133 (*) 135 - 145 (mEq/L)   Potassium 3.9  3.5 - 5.1 (mEq/L)   Chloride 98  96 - 112 (mEq/L)   CO2 26  19 - 32 (mEq/L)   Glucose, Bld 128 (*) 70 - 99 (mg/dL)   BUN 9  6 - 23  (mg/dL)   Creatinine, Ser 2.13  0.50 - 1.10 (mg/dL)   Calcium 08.6  8.4 - 10.5 (mg/dL)   GFR calc non Af Amer 81 (*) >90 (mL/min)   GFR calc Af Amer >90  >90 (mL/min)  CBC     Status: Abnormal   Collection Time   12/03/10  4:18 AM      Component Value Range   WBC 6.9  4.0 - 10.5 (K/uL)   RBC 3.70 (*) 3.87 - 5.11 (MIL/uL)   Hemoglobin 10.5 (*) 12.0 - 15.0 (g/dL)   HCT 57.8 (*) 46.9 - 46.0 (%)   MCV 82.4  78.0 - 100.0 (fL)   MCH 28.4  26.0 - 34.0 (pg)   MCHC 34.4  30.0 - 36.0 (g/dL)   RDW 62.9 (*) 52.8 - 15.5 (%)   Platelets  325  150 - 400 (K/uL)    Intake/Output Summary (Last 24 hours) at 12/03/10 0750 Last data filed at 12/03/10 0200  Gross per 24 hour  Intake 1993.2 ml  Output    350 ml  Net 1643.2 ml    ASSESSMENT AND PLAN:  Principal Problem:  *Chest pain:  Non anginal.  No further cardiac work up.  Follow up with [name] as needed. Active Problems:  HYPERCHOLESTEROLEMIA:  Goal LDL less than 100 in this patient with diabetes.  Plans per VOLLMER,KELLY L, MD  HYPERTENSION, BENIGN ESSENTIAL:  Continue current meds  HYPERGLYCEMIA:  Per primary team.  Long Q-T syndrome:  Not clinically significant. Avoid QT prolonging drugs. Yearly ECG   Rollene Rotunda 12/03/2010 7:50 AM

## 2010-12-03 NOTE — Progress Notes (Signed)
Nutrition dx:  Nutrition-related knowledge deficit r/t diet therapy AEB MD/nursing request  Intervention:   Education on diabetic diet;  Provided.  Goals of nutrition therapy discussed.  Understanding confirmed.  RD contact information provided.  Monitoring:  Knowledge; for questions.  Please consult RD if new questions present.  Pager:  [phone]

## 2010-12-03 NOTE — Progress Notes (Signed)
Subjective: Patient seen and examined ,C/O chest pain with exertion,denies any SOB.  Objective: Vital signs in last 24 hours: Temp:  [97.9 F (36.6 C)-98.7 F (37.1 C)] 98.7 F (37.1 C) (11/14 0607) Pulse Rate:  [65-107] 73  (11/14 0607) Resp:  [11-24] 20  (11/14 0607) BP: (118-141)/(55-75) 127/61 mmHg (11/14 0607) SpO2:  [97 %-100 %] 100 % (11/14 0607) Weight change:  Last BM Date: 12/01/10  Intake/Output from previous day: 11/13 0701 - 11/14 0700 In: 1993.2 [P.O.:655; I.V.:1338.2] Out: 350 [Urine:350]     Physical Exam: General: Alert, awake, oriented x3, in no acute distress. Heart: Regular rate and rhythm, without murmurs, rubs, gallops. Lungs: Clear to auscultation bilaterally. Abdomen: Soft, nontender, nondistended, positive bowel sounds. Extremities: No clubbing cyanosis or edema with positive pedal pulses. Neuro: Grossly intact, nonfocal.    Lab Results: Results for orders placed during the hospital encounter of 12/01/10 (from the past 24 hour(s))  GLUCOSE, CAPILLARY     Status: Abnormal   Collection Time   12/02/10 12:06 PM      Component Value Range   Glucose-Capillary 121 (*) 70 - 99 (mg/dL)  PROTIME-INR     Status: Normal   Collection Time   12/02/10  1:36 PM      Component Value Range   Prothrombin Time 14.2  11.6 - 15.2 (seconds)   INR 1.08  0.00 - 1.49   GLUCOSE, CAPILLARY     Status: Abnormal   Collection Time   12/02/10  4:24 PM      Component Value Range   Glucose-Capillary 108 (*) 70 - 99 (mg/dL)  GLUCOSE, CAPILLARY     Status: Abnormal   Collection Time   12/02/10 10:14 PM      Component Value Range   Glucose-Capillary 136 (*) 70 - 99 (mg/dL)   Comment 1 Notify RN     Comment 2 Documented in Chart    BASIC METABOLIC PANEL     Status: Abnormal   Collection Time   12/03/10  4:18 AM      Component Value Range   Sodium 133 (*) 135 - 145 (mEq/L)   Potassium 3.9  3.5 - 5.1 (mEq/L)   Chloride 98  96 - 112 (mEq/L)   CO2 26  19 - 32 (mEq/L)     Glucose, Bld 128 (*) 70 - 99 (mg/dL)   BUN 9  6 - 23 (mg/dL)   Creatinine, Ser 9.14  0.50 - 1.10 (mg/dL)   Calcium 78.2  8.4 - 10.5 (mg/dL)   GFR calc non Af Amer 81 (*) >90 (mL/min)   GFR calc Af Amer >90  >90 (mL/min)  CBC     Status: Abnormal   Collection Time   12/03/10  4:18 AM      Component Value Range   WBC 6.9  4.0 - 10.5 (K/uL)   RBC 3.70 (*) 3.87 - 5.11 (MIL/uL)   Hemoglobin 10.5 (*) 12.0 - 15.0 (g/dL)   HCT 95.6 (*) 21.3 - 46.0 (%)   MCV 82.4  78.0 - 100.0 (fL)   MCH 28.4  26.0 - 34.0 (pg)   MCHC 34.4  30.0 - 36.0 (g/dL)   RDW 08.6 (*) 57.8 - 15.5 (%)   Platelets 325  150 - 400 (K/uL)  GLUCOSE, CAPILLARY     Status: Abnormal   Collection Time   12/03/10  8:23 AM      Component Value Range   Glucose-Capillary 102 (*) 70 - 99 (mg/dL)    Recent Results (  from the past 240 hour(s))  MRSA PCR SCREENING     Status: Normal   Collection Time   12/01/10 10:14 PM      Component Value Range Status Comment   MRSA by PCR NEGATIVE  NEGATIVE  Final     Studies/Results: Dg Chest Port 1 View  12/01/2010  *RADIOLOGY REPORT*  Clinical Data: Chest pain.  PORTABLE CHEST - 1 VIEW  Comparison: None.  Findings: Lungs are clear.  Heart size is normal.  No pneumothorax or pleural effusion.  IMPRESSION: No acute disease.  Original Report Authenticated By: Bernadene Bell. Maricela Curet, M.D.    Medications:    . amLODipine  5 mg Oral Daily  . aspirin  324 mg Oral Once  . cyanocobalamin  500 mcg Oral Daily  . diazepam  5 mg Oral On Call  . escitalopram  5 mg Oral Daily  . fentaNYL      . gabapentin  300 mg Oral TID  . heparin      . heparin      . insulin aspart  0-15 Units Subcutaneous TID WC  . insulin aspart  0-5 Units Subcutaneous QHS  . midazolam      . nitroGLYCERIN      . pantoprazole  40 mg Oral Q1200  . potassium chloride  40 mEq Oral Once  . rosuvastatin  20 mg Oral q1800  . verapamil      . DISCONTD: aspirin  324 mg Oral Pre-Cath  . DISCONTD: enoxaparin  40 mg  Subcutaneous QHS  . DISCONTD: sodium chloride  3 mL Intravenous Q12H    acetaminophen, ALPRAZolam, azelastine, HYDROcodone-acetaminophen, misoprostol, DISCONTD: acetaminophen, DISCONTD: diclofenac, DISCONTD: morphine, DISCONTD: sodium chloride     . sodium chloride    . DISCONTD: sodium chloride 1 mL/kg/hr (12/02/10 1334)    Assessment/Plan:  Principal Problem:  *Chest pain,Atypical: Cath is unremarkable .mostlikley musculoskeletal ,patient admitted that she has been moving heavy furniture while cleaning her home. New onset DM: Continue with insulin scale will switch to metformin on D/C .will request diabetic educator consult Active Problems:  HYPERCHOLESTEROLEMIA Continue crestor   ANXIETY Continue meds  HYPERTENSION, BENIGN ESSENTIAL Controlled on Amlodipine,HCTZ  Discontinued   Long Q-T syndrome As per cardiology ,recommendations to avoid Q-T prolonging Agents and yearly ECG.   Hyponatremia mostlikley secondary to HCTZ ,now improving.  D/C planning: PT consult     LOS: 2 days   Barbi Kumagai 12/03/2010, 9:26 AM

## 2010-12-03 NOTE — Progress Notes (Signed)
Utilization review complete 

## 2010-12-04 LAB — BASIC METABOLIC PANEL
Chloride: 95 mEq/L — ABNORMAL LOW (ref 96–112)
GFR calc Af Amer: >90 mL/min (ref >90)
GFR calc non Af Amer: 82 mL/min — ABNORMAL LOW (ref >90)
Glucose, Bld: 121 mg/dL — ABNORMAL HIGH (ref 70–99)
Potassium: 3.7 mEq/L (ref 3.5–5.1)
Sodium: 132 mEq/L — ABNORMAL LOW (ref 135–145)

## 2010-12-04 LAB — CBC
HCT: 31.5 % — ABNORMAL LOW (ref 36.0–46.0)
Hemoglobin: 10.7 g/dL — ABNORMAL LOW (ref 12.0–15.0)
MCH: 28.2 pg (ref 26.0–34.0)
RBC: 3.8 MIL/uL — ABNORMAL LOW (ref 3.87–5.11)

## 2010-12-04 LAB — GLUCOSE, CAPILLARY
Glucose-Capillary: 147 mg/dL — ABNORMAL HIGH (ref 70–99)
Glucose-Capillary: 101 mg/dL — ABNORMAL HIGH (ref 70–99)

## 2010-12-04 MED ORDER — BLOOD GLUCOSE METER KIT: PACK | Status: DC

## 2010-12-04 NOTE — Progress Notes (Signed)
SUBJECTIVE:  No chest pain ? Going home   PHYSICAL EXAM Filed Vitals:   12/03/10 1636 12/03/10 1857 12/03/10 2100 12/04/10 0500  BP:  137/77 117/75 126/68  Pulse:  80 77 74  Temp: 98.9 F (37.2 C) 97.9 F (36.6 C) 98.1 F (36.7 C) 98 F (36.7 C)  TempSrc: Oral Oral Oral Oral  Resp: 20 20 18 18   Height:      Weight:      SpO2: 100% 100% 99%    General:  No distress Lungs:  Clear Heart:  RRR, no murmurs Abdomen:  .obese Extremities:  Right wrist without erythema, hematoma.    LABS: Lab Results  Component Value Date   CKTOTAL 510* 12/02/2010   CKMB 7.8* 12/02/2010   TROPONINI <0.30 12/02/2010   Results for orders placed during the hospital encounter of 12/01/10 (from the past 24 hour(s))  GLUCOSE, CAPILLARY     Status: Abnormal   Collection Time   12/03/10  8:23 AM      Component Value Range   Glucose-Capillary 102 (*) 70 - 99 (mg/dL)  GLUCOSE, CAPILLARY     Status: Normal   Collection Time   12/03/10  1:00 PM      Component Value Range   Glucose-Capillary 89  70 - 99 (mg/dL)  GLUCOSE, CAPILLARY     Status: Abnormal   Collection Time   12/03/10  4:48 PM      Component Value Range   Glucose-Capillary 127 (*) 70 - 99 (mg/dL)  GLUCOSE, CAPILLARY     Status: Abnormal   Collection Time   12/03/10  9:03 PM      Component Value Range   Glucose-Capillary 121 (*) 70 - 99 (mg/dL)  BASIC METABOLIC PANEL     Status: Abnormal   Collection Time   12/04/10  5:50 AM      Component Value Range   Sodium 132 (*) 135 - 145 (mEq/L)   Potassium 3.7  3.5 - 5.1 (mEq/L)   Chloride 95 (*) 96 - 112 (mEq/L)   CO2 27  19 - 32 (mEq/L)   Glucose, Bld 121 (*) 70 - 99 (mg/dL)   BUN 7  6 - 23 (mg/dL)   Creatinine, Ser 5.40  0.50 - 1.10 (mg/dL)   Calcium 9.6  8.4 - 98.1 (mg/dL)   GFR calc non Af Amer 82 (*) >90 (mL/min)   GFR calc Af Amer >90  >90 (mL/min)  CBC     Status: Abnormal   Collection Time   12/04/10  5:50 AM      Component Value Range   WBC 7.0  4.0 - 10.5 (K/uL)   RBC  3.80 (*) 3.87 - 5.11 (MIL/uL)   Hemoglobin 10.7 (*) 12.0 - 15.0 (g/dL)   HCT 19.1 (*) 47.8 - 46.0 (%)   MCV 82.9  78.0 - 100.0 (fL)   MCH 28.2  26.0 - 34.0 (pg)   MCHC 34.0  30.0 - 36.0 (g/dL)   RDW 29.5 (*) 62.1 - 15.5 (%)   Platelets 344  150 - 400 (K/uL)  GLUCOSE, CAPILLARY     Status: Abnormal   Collection Time   12/04/10  7:28 AM      Component Value Range   Glucose-Capillary 147 (*) 70 - 99 (mg/dL)   Comment 1 Notify RN      Intake/Output Summary (Last 24 hours) at 12/04/10 0738 Last data filed at 12/03/10 0900  Gross per 24 hour  Intake    240 ml  Output  0 ml  Net    240 ml    ASSESSMENT AND PLAN:  Principal Problem:  *Chest pain:  Non anginal.  No further cardiac work up.  Follow up with [name] as needed. Active Problems:  HYPERCHOLESTEROLEMIA:  Goal LDL less than 100 in this patient with diabetes.  Plans per VOLLMER,KELLY L, MD  HYPERTENSION, BENIGN ESSENTIAL:  Continue current meds  HYPERGLYCEMIA:  Per primary team.  Long Q-T syndrome:  Not clinically significant. Avoid QT prolonging drugs. Yearly ECG   Charlton Haws 12/04/2010 7:38 AM

## 2010-12-04 NOTE — Discharge Summary (Signed)
Patient ID: Casey [name] MRN: [id number] DOB/AGE: Jan 29, 1935 75 y.o.  Admit date: 12/01/2010 Discharge date: 12/04/2010  Primary Care Physician:  Eustace Moore, MD   Discharge Diagnoses:    Present on Admission: .Chest pain .Long Q-T syndrome .Hyponatremia New onset diabetes mellitus type 2  .HYPERCHOLESTEROLEMIA .ANXIETY .HYPERTENSION, BENIGN ESSENTIAL .ESOPHAGEAL STRICTURE .HYPERGLYCEMIA   Current Discharge Medication List    START taking these medications   Details  Blood Glucose Monitoring Suppl (BLOOD GLUCOSE METER) kit Use as instructed Qty: 1 each, Refills: 0      CONTINUE these medications which have NOT CHANGED   Details  acetaminophen (TYLENOL) 500 MG tablet Take 500-1,000 mg by mouth every 6 (six) hours as needed. For pain     ALPRAZolam (XANAX) 0.5 MG tablet Take 0.5 mg by mouth daily as needed. For anxiety    amLODipine (NORVASC) 5 MG tablet Take 5 mg by mouth daily.      atorvastatin (LIPITOR) 40 MG tablet Take 40 mg by mouth every evening.     Azelastine HCl (ASTEPRO) 0.15 % SOLN Place 1 spray into the nose as needed. For allergies    ciclesonide (OMNARIS) 50 MCG/ACT nasal spray Place 2 sprays into both nostrils daily as needed. For allergies    cyanocobalamin 500 MCG tablet Take 500 mcg by mouth daily.      diclofenac-misoprostol (ARTHROTEC 75) 75-0.2 MG per tablet Take 1 tablet by mouth 2 (two) times daily as needed. For arthritis    escitalopram (LEXAPRO) 5 MG tablet Take 5 mg by mouth daily.      gabapentin (NEURONTIN) 300 MG capsule Take 300 mg by mouth 3 (three) times daily.      omeprazole (PRILOSEC) 20 MG capsule Take 20 mg by mouth daily.        STOP taking these medications     triamterene-hydrochlorothiazide (MAXZIDE) 75-50 MG per tablet          Consults:  CARDIOLOGY   Significant Diagnostic Studies:  CXR IMPRESSION:  No acute disease.  Cardiac cath Final Conclusions:  1. Normal coronary anatomy.  2. Normal  left ventricular function.  Recommendations: Evaluate for noncardiac causes of chest pain.  Brief H and P: For complete details please refer to admission H and P, but in brief  Patient is a 75 year old female who was seen by Dr. Eden Emms of [facility] last week for evaluation of chest pain and a prolonged QT interval . Patient presents to the emergency department today because of ongoing chest pain which she states it is 10/10 at times. Patient states that the pain seems to come and go. It is exertional and she states it gets better with rest. She does get short of breath with it. She does get nauseated with it. She does not get sweaty with it. She states that the pain is substernal and sharp in quality. The pain does not radiate. She states the pain goes away on its own after a while. She cannot define what it while is. Patient also states she has felt extraordinarily fatigued lately and her husband commented that she seems to drink an awful lot of water.   Hospital Course: Principal Problem:  *Chest pain,Atypical:   Cardiac Cath is unremarkable .mostlikley musculoskeletal ,patient admitted that she has been moving heavy furniture while cleaning her home. Continue Tylenol when necessary for pain. Patient is currently chest pain-free. New onset DM:  Patient was noted to have hyperglycemia, hemoglobin A1c was checked one was found to be 6.6%. Patient  was covered with insulin scale during this hospitalization she was seen by diabetic educator team and the dietitian. I offered, on metformin however patient declined and stated that she will call her medical doctor to discuss new medication for diabetes. Patient was to be given a prescription for other medical meter I advised her to check her blood glucose at least 3 times a day on the right findings on a log book and discussed with Dr. Reesa Chew also requested outpatient diabetic educator consult. Patient counseled on diet. Active Problems:    HYPERCHOLESTEROLEMIA  Continue crestor  ANXIETY  Continue meds  HYPERTENSION, BENIGN ESSENTIAL  Controlled on Amlodipine,HCTZ Discontinued  Long Q-T syndrome  As per cardiology ,recommendations to avoid Q-T prolonging  Agents and yearly ECG.  Hyponatremia  mostlikley secondary to HCTZ ,now improving with discontinuation of HCTZ. Patient also advised to restrict her fluid intake. Patient to follow with her PCP for repeat labs. If hyponatremia persists consideration will be possible SIADH secondary to lexapro. Will defer  further adjustment of her meds are PCP based on how to repeat blood work.   Subjective:  Patient seen and examined, denies any chest pain or any other complaints.   Filed Vitals:   12/04/10 0917  BP: 132/65  Pulse:   Temp:   Resp:     General: Alert, awake, oriented x3, in no acute distress. Heart: Regular rate and rhythm, without murmurs, rubs, gallops. Lungs: Clear to auscultation bilaterally. Abdomen: Soft, nontender, nondistended, positive bowel sounds. Extremities: No clubbing cyanosis or edema with positive pedal pulses. Neuro: Grossly intact, nonfocal.   Disposition and Follow-up: To home after PT evaluation which is still pending. Follow his PCP in one week.   Time spent on Discharge: 45 minutes   Signed: Jema Deegan 12/04/2010, 12:25 PM

## 2010-12-04 NOTE — Progress Notes (Signed)
Physical Therapy Evaluation Patient Details Name: Casey [name] MRN: [id number] DOB: Jan 15, 1936 Today's Date: 12/04/2010  Problem List:  Patient Active Problem List  Diagnoses  . HYPERCHOLESTEROLEMIA  . OBESITY  . ANXIETY  . BENIGN POSITIONAL VERTIGO  . HYPERTENSION, BENIGN ESSENTIAL  . SINUSITIS, RECURRENT  . ALLERGIC RHINITIS  . ESOPHAGEAL STRICTURE  . CONSTIPATION  . MICROSCOPIC HEMATURIA  . OSTEOARTHRITIS, KNEE  . HIP PAIN, LEFT  . ARM PAIN, RIGHT  . OSTEOPOROSIS  . FATIGUE  . LEG EDEMA, BILATERAL  . HEADACHE  . FLANK PAIN, LEFT  . SPRAIN&STRAIN OTH SPEC SITES SHOULDER&UPPER ARM  . PROBLEMS WITH SWALLOWING AND MASTICATION  . HYPERGLYCEMIA  . CYSTOCELE WITH INCOMPLETE UTERINE PROLAPSE  . Chest pain  . Long Q-T syndrome  . Hyponatremia    Past Medical History:  Past Medical History  Diagnosis Date  . HYPERTENSION, BENIGN ESSENTIAL 03/03/2007  . HYPERCHOLESTEROLEMIA   . SPRAIN&STRAIN OTH SPEC SITES SHOULDER&UPPER ARM   . SINUSITIS, RECURRENT   . Problems with swallowing and mastication   . OSTEOPOROSIS   . OSTEOARTHRITIS, KNEE   . OBESITY   . Microscopic hematuria   . LEG EDEMA, BILATERAL   . HYPERGLYCEMIA   . HIP PAIN, LEFT   . Headache   . FLANK PAIN, LEFT   . FATIGUE   . ESOPHAGEAL STRICTURE   . CYSTOCELE WITH INCOMPLETE UTERINE PROLAPSE   . CONSTIPATION   . BENIGN POSITIONAL VERTIGO   . ARM PAIN, RIGHT   . ANXIETY   . ALLERGIC RHINITIS    Past Surgical History: History reviewed. No pertinent past surgical history.  PT Assessment/Plan/Recommendation PT Assessment Clinical Impression Statement: Pt is a 75 y/o lady who came to the hospital with chest pain s/p cardiac cath which revealed no blockages. Pt is eager to go home. Denies any concerns of d/c. Level home with no steps to enter or go up and down to her bedroom. Good family and support to help her at home. No further PT needs. PT signing off PT Recommendation/Assessment: Patent does not need  any further PT services No Skilled PT: All education completed;Patient at baseline level of functioning;Patient is modified independent with all activity/mobility PT Goals     PT Evaluation   Prior Functioning  Home Living Lives With: Alone Receives Help From: Family Type of Home: House Home Layout: One level Home Access: Level entry Bathroom Shower/Tub: Engineer, manufacturing systems: Standard Home Adaptive Equipment: None Prior Function Level of Independence: Independent with basic ADLs;Independent with homemaking with ambulation;Independent with transfers Driving: Yes Vocation: Unemployed Cognition Cognition Arousal/Alertness: Awake/alert Overall Cognitive Status: Appears within functional limits for tasks assessed Sensation/Coordination Sensation Light Touch: Appears Intact Coordination Gross Motor Movements are Fluid and Coordinated: Yes Fine Motor Movements are Fluid and Coordinated: Yes Extremity Assessment RUE Assessment RUE Assessment: Within Functional Limits LUE Assessment LUE Assessment: Within Functional Limits RLE Assessment RLE Assessment: Within Functional Limits LLE Assessment LLE Assessment: Within Functional Limits Mobility (including Balance) Transfers Transfers: Yes Sit to Stand: 6: Modified independent (Device/Increase time) Stand to Sit: 6: Modified independent (Device/Increase time) Ambulation/Gait Ambulation/Gait: Yes Ambulation/Gait Assistance: 7: Independent Ambulation Distance (Feet): 300 Feet Assistive device: None Gait Pattern: Within Functional Limits Gait velocity: Amb at adequate speed for safety, good ability to negotiate obstacle and turn head to scan the environment, able to step over obstacle on the floor Stairs:  (pt does not have any stairs at home )  Posture/Postural Control Posture/Postural Control: No significant limitations Balance Balance Assessed: Yes  Static Standing Balance Tandem Stance - Left Leg:  (pt able to  take larger step and place it in front of her I ) Rhomberg - Eyes Opened: 60  Exercise    End of Session PT - End of Session Equipment Utilized During Treatment: Gait belt Activity Tolerance: Patient tolerated treatment well Patient left: in bed Nurse Communication: Mobility status for transfers;Mobility status for ambulation General Behavior During Session: [facility] for tasks performed Cognition: [facility] for tasks performed  [facility] HELEN 12/04/2010, 1:27 PM

## 2010-12-04 NOTE — Progress Notes (Signed)
Discharge review done with patient.  She acknowledged understanding of information provided. Prescription given per MD's order. Patient is stable.

## 2010-12-04 NOTE — Progress Notes (Signed)
Utilization review complete 

## 2010-12-05 ENCOUNTER — Telehealth: Payer: Self-pay | Admitting: Cardiovascular Disease

## 2010-12-05 NOTE — Telephone Encounter (Signed)
Pt returning your call

## 2010-12-05 NOTE — Telephone Encounter (Signed)
Spoke with pt, she was recently in the hosp and had a normal cath. She questioned follow up. According to the discharge summ pt will follow up with PCP Deliah Goody

## 2010-12-08 ENCOUNTER — Ambulatory Visit: Payer: Medicare Other

## 2010-12-08 ENCOUNTER — Other Ambulatory Visit (HOSPITAL_COMMUNITY): Payer: Medicare Other | Admitting: Radiology

## 2011-02-10 ENCOUNTER — Other Ambulatory Visit: Payer: Self-pay | Admitting: Internal Medicine

## 2011-02-10 DIAGNOSIS — Z1231 Encounter for screening mammogram for malignant neoplasm of breast: Secondary | ICD-10-CM

## 2011-03-06 ENCOUNTER — Ambulatory Visit
Admission: RE | Admit: 2011-03-06 | Discharge: 2011-03-06 | Disposition: A | Discharge status: Home / Self Care | Payer: Medicare Other | Source: Ambulatory Visit | Attending: Internal Medicine | Admitting: Internal Medicine

## 2011-03-06 DIAGNOSIS — Z1231 Encounter for screening mammogram for malignant neoplasm of breast: Secondary | ICD-10-CM

## 2011-10-13 ENCOUNTER — Encounter: Payer: Self-pay | Admitting: Family Medicine

## 2011-10-23 ENCOUNTER — Ambulatory Visit: Payer: Medicare Other

## 2011-10-23 ENCOUNTER — Ambulatory Visit (INDEPENDENT_AMBULATORY_CARE_PROVIDER_SITE_OTHER): Payer: Medicare Other | Admitting: Family Medicine

## 2011-10-23 ENCOUNTER — Encounter: Payer: Self-pay | Admitting: Family Medicine

## 2011-10-23 VITALS — BP 188/84 | HR 87 | Temp 99.0°F | Resp 16 | Ht 62.0 in | Wt 195.6 lb

## 2011-10-23 DIAGNOSIS — E7800 Pure hypercholesterolemia, unspecified: Secondary | ICD-10-CM

## 2011-10-23 DIAGNOSIS — I10 Essential (primary) hypertension: Secondary | ICD-10-CM

## 2011-10-23 DIAGNOSIS — R609 Edema, unspecified: Secondary | ICD-10-CM

## 2011-10-23 DIAGNOSIS — E559 Vitamin D deficiency, unspecified: Secondary | ICD-10-CM

## 2011-10-23 DIAGNOSIS — Z79899 Other long term (current) drug therapy: Secondary | ICD-10-CM

## 2011-10-23 DIAGNOSIS — F419 Anxiety disorder, unspecified: Secondary | ICD-10-CM

## 2011-10-23 DIAGNOSIS — M1710 Unilateral primary osteoarthritis, unspecified knee: Secondary | ICD-10-CM

## 2011-10-23 DIAGNOSIS — E119 Type 2 diabetes mellitus without complications: Secondary | ICD-10-CM

## 2011-10-23 DIAGNOSIS — Z5181 Encounter for therapeutic drug level monitoring: Secondary | ICD-10-CM | POA: Insufficient documentation

## 2011-10-23 DIAGNOSIS — D649 Anemia, unspecified: Secondary | ICD-10-CM | POA: Insufficient documentation

## 2011-10-23 LAB — COMPREHENSIVE METABOLIC PANEL
CO2: 27 mEq/L (ref 19–32)
Calcium: 9.7 mg/dL (ref 8.4–10.5)
Chloride: 95 mEq/L — ABNORMAL LOW (ref 96–112)
Creat: 0.93 mg/dL (ref 0.50–1.10)
Glucose, Bld: 107 mg/dL — ABNORMAL HIGH (ref 70–99)
Sodium: 132 mEq/L — ABNORMAL LOW (ref 135–145)
Total Bilirubin: 0.6 mg/dL (ref 0.3–1.2)
Total Protein: 6.9 g/dL (ref 6.0–8.3)

## 2011-10-23 LAB — CBC WITH DIFFERENTIAL/PLATELET
Eosinophils Absolute: 0.3 10*3/uL (ref 0.0–0.7)
Eosinophils Relative: 3 % (ref 0–5)
Hemoglobin: 11.1 g/dL — ABNORMAL LOW (ref 12.0–15.0)
Lymphs Abs: 2.5 10*3/uL (ref 0.7–4.0)
MCH: 28.2 pg (ref 26.0–34.0)
MCV: 83.7 fL (ref 78.0–100.0)
Monocytes Relative: 8 % (ref 3–12)
RBC: 3.93 MIL/uL (ref 3.87–5.11)

## 2011-10-23 LAB — TSH: TSH: 1.411 u[IU]/mL (ref 0.350–4.500)

## 2011-10-23 MED ORDER — POTASSIUM CHLORIDE CRYS ER 20 MEQ PO TBCR: 20.0000 meq | EXTENDED_RELEASE_TABLET | Freq: Every day | ORAL | Status: DC

## 2011-10-23 MED ORDER — TRIAMTERENE-HCTZ 75-50 MG PO TABS: 1.0000 | ORAL_TABLET | Freq: Every day | ORAL | Status: DC

## 2011-10-23 MED ORDER — FUROSEMIDE 20 MG PO TABS: 20.0000 mg | ORAL_TABLET | Freq: Every day | ORAL | Status: DC

## 2011-10-23 MED ORDER — OMEPRAZOLE 20 MG PO CPDR: 20.0000 mg | DELAYED_RELEASE_CAPSULE | Freq: Every day | ORAL | Status: DC

## 2011-10-23 MED ORDER — ALPRAZOLAM 0.5 MG PO TABS: 0.5000 mg | ORAL_TABLET | Freq: Every evening | ORAL | Status: DC | PRN

## 2011-10-23 MED ORDER — GABAPENTIN 300 MG PO CAPS: 300.0000 mg | ORAL_CAPSULE | Freq: Three times a day (TID) | ORAL | Status: DC

## 2011-10-23 MED ORDER — ATORVASTATIN CALCIUM 40 MG PO TABS: 40.0000 mg | ORAL_TABLET | Freq: Every evening | ORAL | Status: DC

## 2011-10-23 MED ORDER — CLONIDINE HCL 0.1 MG PO TABS: 0.1000 mg | ORAL_TABLET | Freq: Two times a day (BID) | ORAL | Status: DC

## 2011-10-23 MED ORDER — ESCITALOPRAM OXALATE 5 MG PO TABS: 5.0000 mg | ORAL_TABLET | Freq: Every day | ORAL | Status: DC

## 2011-10-23 NOTE — Progress Notes (Signed)
Subjective:    Patient ID: Casey [name], female    DOB: 04/19/1935, 76 y.o.   MRN: [id number] Chief Complaint  Patient presents with  . Anemia  . Edema    LEGS, ARMS X 3 WEEKS  . Arthritis    ARMS AND JOINTS       HPI  Casey [name] is a delightful 76 yo here from [facility] to establish care.  C/o knee pain, stiffness, and weakness.  When she is getting out of a car, she has to use her hands and actually lift up her leg to move it out of the car.  Past Medical History  Diagnosis Date  . HYPERTENSION, BENIGN ESSENTIAL 03/03/2007  . HYPERCHOLESTEROLEMIA   . SPRAIN&STRAIN OTH SPEC SITES SHOULDER&UPPER ARM   . SINUSITIS, RECURRENT   . Problems with swallowing and mastication   . OSTEOPOROSIS   . OSTEOARTHRITIS, KNEE   . OBESITY   . Microscopic hematuria   . LEG EDEMA, BILATERAL   . HYPERGLYCEMIA   . HIP PAIN, LEFT   . Headache   . FLANK PAIN, LEFT   . FATIGUE   . ESOPHAGEAL STRICTURE   . CYSTOCELE WITH INCOMPLETE UTERINE PROLAPSE   . CONSTIPATION   . BENIGN POSITIONAL VERTIGO   . ARM PAIN, RIGHT   . ANXIETY   . ALLERGIC RHINITIS    Current Outpatient Prescriptions on File Prior to Visit  Medication Sig Dispense Refill  . Blood Glucose Monitoring Suppl (BLOOD GLUCOSE METER) kit NEED REFILLS     Use as instructed      . cyanocobalamin 500 MCG tablet Take 500 mcg by mouth daily.       Marland Kitchen acetaminophen (TYLENOL) 500 MG tablet Take 500-1,000 mg by mouth every 6 (six) hours as needed. For pain       . atorvastatin (LIPITOR) 40 MG tablet Take 1 tablet (40 mg total) by mouth every evening.  30 tablet  5  . azelastine (ASTELIN) 137 MCG/SPRAY nasal spray Place 1 spray into the nose 2 (two) times daily as needed for rhinitis. Use in each nostril as directed  30 mL  11  . cloNIDine (CATAPRES) 0.1 MG tablet Take 1 tablet (0.1 mg total) by mouth 2 (two) times daily.  60 tablet  3  . escitalopram (LEXAPRO) 5 MG tablet Take 1 tablet (5 mg total) by mouth daily.  30 tablet  5  .  fluticasone (FLONASE) 50 MCG/ACT nasal spray       . furosemide (LASIX) 20 MG tablet Take 1 tablet (20 mg total) by mouth daily.  30 tablet  3  . gabapentin (NEURONTIN) 300 MG capsule Take 1 capsule (300 mg total) by mouth 3 (three) times daily.  90 capsule  3  . ipratropium (ATROVENT) 0.06 % nasal spray       . omeprazole (PRILOSEC) 20 MG capsule Take 1 capsule (20 mg total) by mouth daily.  30 capsule  3  . potassium chloride SA (K-DUR,KLOR-CON) 20 MEQ tablet Take 1 tablet (20 mEq total) by mouth daily.  30 tablet  3   No Known Allergies   Review of Systems  Constitutional: Positive for activity change. Negative for fever, chills and unexpected weight change.  Cardiovascular: Positive for leg swelling.  Musculoskeletal: Positive for myalgias, joint swelling, arthralgias and gait problem. Negative for back pain.  Skin: Negative for color change and rash.  Neurological: Positive for dizziness and weakness. Negative for syncope, light-headedness and numbness.  Hematological: Negative for adenopathy. Does not  bruise/bleed easily.      BP 188/84  Pulse 87  Temp 99 F (37.2 C) (Oral)  Resp 16  Ht 5\' 2"  (1.575 m)  Wt 195 lb 9.6 oz (88.724 kg)  BMI 35.78 kg/m2  SpO2 99% Objective:   Physical Exam  Constitutional: She is oriented to person, place, and time. She appears well-developed and well-nourished. No distress.  HENT:  Head: Normocephalic and atraumatic.  Right Ear: External ear normal.  Left Ear: External ear normal.  Eyes: Conjunctivae normal are normal. No scleral icterus.  Neck: Normal range of motion. Neck supple. No thyromegaly present.  Cardiovascular: Normal rate, regular rhythm, normal heart sounds and intact distal pulses.   Pulmonary/Chest: Effort normal and breath sounds normal. No respiratory distress.  Musculoskeletal: She exhibits no edema.       Right knee: Normal. She exhibits normal range of motion, no effusion, no LCL laxity, normal patellar mobility, normal  meniscus and no MCL laxity. no tenderness found.       Left knee: Normal. She exhibits normal range of motion, no effusion, no LCL laxity, normal patellar mobility, normal meniscus and no MCL laxity. no tenderness found.  Lymphadenopathy:    She has no cervical adenopathy.  Neurological: She is alert and oriented to person, place, and time.  Skin: Skin is warm and dry. She is not diaphoretic. No erythema.  Psychiatric: She has a normal mood and affect. Her behavior is normal.       Results for orders placed in visit on 10/23/11  POCT GLYCOSYLATED HEMOGLOBIN (HGB A1C)      Component Value Range   Hemoglobin A1C 6.1     Bilateral standing knee xrays: Mild deg change bilaterally. L knee medial joint space narrowing. No other gross abnormalities. Assessment & Plan:   1. HYPERCHOLESTEROLEMIA  Cont lipitor - needs flp at f/u  2. HYPERTENSION, BENIGN ESSENTIAL  Increase clonidine 0.1mg  from qd to bid  3. OSTEOARTHRITIS, KNEE  DG Knee 1-2 Views Left, DG Knee 1-2 Views Right - had been on diclofenac but not helping and likely making esophageal stricture worse so d/c.  Rec tylenol, cons PT.  4. LEG EDEMA, BILATERAL  Comprehensive metabolic panel, TSH  5. Diabetes mellitus  POCT glycosylated hemoglobin (Hb A1C) - well controlled with diet  6. Encounter for long-term (current) use of other medications  Comprehensive metabolic panel  7. Anxiety  TSH, refilled low dose prn xanax  8. Anemia  CBC with Differential, Ferritin  9. Vitamin D deficiency disease  Vitamin D 25 hydroxy

## 2011-10-23 NOTE — Patient Instructions (Addendum)
Increase your clonidine 0.1 to twice daily and start lasix 20mg  daily as needed for swelling. Consider trying some compression hose (you can obtain these OTC at a pharmacy).  Recheck in 2 wks to ensure your BP is down, your swelling is better, and we will do your knee injections at that time.

## 2011-10-24 LAB — VITAMIN D 25 HYDROXY (VIT D DEFICIENCY, FRACTURES): Vit D, 25-Hydroxy: 50 ng/mL (ref 30–89)

## 2011-11-09 ENCOUNTER — Ambulatory Visit (INDEPENDENT_AMBULATORY_CARE_PROVIDER_SITE_OTHER): Payer: Medicare Other | Admitting: Family Medicine

## 2011-11-09 VITALS — BP 140/75 | HR 82 | Temp 98.1°F | Resp 18 | Ht 63.5 in | Wt 197.0 lb

## 2011-11-09 DIAGNOSIS — Z79899 Other long term (current) drug therapy: Secondary | ICD-10-CM

## 2011-11-09 DIAGNOSIS — R1032 Left lower quadrant pain: Secondary | ICD-10-CM

## 2011-11-09 DIAGNOSIS — E119 Type 2 diabetes mellitus without complications: Secondary | ICD-10-CM

## 2011-11-09 DIAGNOSIS — R609 Edema, unspecified: Secondary | ICD-10-CM

## 2011-11-09 DIAGNOSIS — IMO0002 Reserved for concepts with insufficient information to code with codable children: Secondary | ICD-10-CM

## 2011-11-09 DIAGNOSIS — M25559 Pain in unspecified hip: Secondary | ICD-10-CM

## 2011-11-09 DIAGNOSIS — R7309 Other abnormal glucose: Secondary | ICD-10-CM

## 2011-11-09 DIAGNOSIS — I10 Essential (primary) hypertension: Secondary | ICD-10-CM

## 2011-11-09 DIAGNOSIS — R109 Unspecified abdominal pain: Secondary | ICD-10-CM

## 2011-11-09 DIAGNOSIS — M1710 Unilateral primary osteoarthritis, unspecified knee: Secondary | ICD-10-CM

## 2011-11-09 DIAGNOSIS — D649 Anemia, unspecified: Secondary | ICD-10-CM

## 2011-11-09 LAB — POCT UA - MICROSCOPIC ONLY
Casts, Ur, LPF, POC: NEGATIVE
Crystals, Ur, HPF, POC: NEGATIVE
Yeast, UA: NEGATIVE

## 2011-11-09 LAB — POCT URINALYSIS DIPSTICK
Ketones, UA: NEGATIVE
Leukocytes, UA: NEGATIVE
Nitrite, UA: NEGATIVE
Protein, UA: NEGATIVE
Urobilinogen, UA: 1
pH, UA: 8

## 2011-11-09 NOTE — Patient Instructions (Addendum)
Joint Injection  Care After  Refer to this sheet in the next few days. These instructions provide you with information on caring for yourself after you have had a joint injection. Your caregiver also may give you more specific instructions. Your treatment has been planned according to current medical practices, but problems sometimes occur. Call your caregiver if you have any problems or questions after your procedure.  After any type of joint injection, it is not uncommon to experience:  · Soreness, swelling, or bruising around the injection site.  · Mild numbness, tingling, or weakness around the injection site caused by the numbing medicine used before or with the injection.  It also is possible to experience the following effects associated with the specific agent after injection:  · Iodine-based contrast agents:  · Allergic reaction (itching, hives, widespread redness, and swelling beyond the injection site).  · Corticosteroids (These effects are rare.):  · Allergic reaction.  · Increased blood sugar levels (If you have diabetes and you notice that your blood sugar levels have increased, notify your caregiver).  · Increased blood pressure levels.  · Mood swings.  · Hyaluronic acid in the use of viscosupplementation.  · Temporary heat or redness.  · Temporary rash and itching.  · Increased fluid accumulation in the injected joint.  These effects all should resolve within a day after your procedure.   HOME CARE INSTRUCTIONS  · Limit yourself to light activity the day of your procedure. Avoid lifting heavy objects, bending, stooping, or twisting.  · Take prescription or over-the-counter pain medication as directed by your caregiver.  · You may apply ice to your injection site to reduce pain and swelling the day of your procedure. Ice may be applied 3 to 4 times:  · Put ice in a plastic bag.  · Place a towel between your skin and the bag.  · Leave the ice on for no longer than 15 to 20 minutes each time.  SEEK  IMMEDIATE MEDICAL CARE IF:   · Pain and swelling get worse rather than better or extend beyond the injection site.  · Numbness does not go away.  · Blood or fluid continues to leak from the injection site.  · You have chest pain.  · You have swelling of your face or tongue.  · You have trouble breathing or you become dizzy.  · You develop a fever, chills, or severe tenderness at the injection site that last longer than 1 day.  MAKE SURE YOU:  · Understand these instructions.  · Watch your condition.  · Get help right away if you are not doing well or if you get worse.  Document Released: 09/18/2010 Document Revised: 03/30/2011 Document Reviewed: 09/18/2010  [employer]® Patient Information ©2013 [employer].

## 2011-11-09 NOTE — Progress Notes (Signed)
Subjective:    Patient ID: Casey [name], female    DOB: Aug 04, 1935, 76 y.o.   MRN: [id number]  HPI  C/o left flank pain worsens w/ sitting. No changes in urine but drinking a lot of water. Swelling is about the same but knee pain is getting worse, sometimes can hardly stand up due to severity of pain. Increase clonidine to 1 tab twice a day and tolerating w/o any difficult. Had colonoscopy prev - has to have every 3 yrs to see if she has a polyp.  Past Medical History  Diagnosis Date  . HYPERTENSION, BENIGN ESSENTIAL 03/03/2007  . HYPERCHOLESTEROLEMIA   . SPRAIN&STRAIN OTH SPEC SITES SHOULDER&UPPER ARM   . SINUSITIS, RECURRENT   . Problems with swallowing and mastication   . OSTEOPOROSIS   . OSTEOARTHRITIS, KNEE   . OBESITY   . Microscopic hematuria   . LEG EDEMA, BILATERAL   . HYPERGLYCEMIA   . HIP PAIN, LEFT   . Headache   . FLANK PAIN, LEFT   . FATIGUE   . ESOPHAGEAL STRICTURE   . CYSTOCELE WITH INCOMPLETE UTERINE PROLAPSE   . CONSTIPATION   . BENIGN POSITIONAL VERTIGO   . ARM PAIN, RIGHT   . ANXIETY   . ALLERGIC RHINITIS     Current Outpatient Prescriptions on File Prior to Visit  Medication Sig Dispense Refill  . acetaminophen (TYLENOL) 500 MG tablet Take 500-1,000 mg by mouth every 6 (six) hours as needed. For pain       . Blood Glucose Monitoring Suppl (BLOOD GLUCOSE METER) kit NEED REFILLS     Use as instructed      . cholecalciferol (VITAMIN D) 1000 UNITS tablet Take 1,000 Units by mouth daily.      . cyanocobalamin 500 MCG tablet Take 500 mcg by mouth daily.       . fluticasone (FLONASE) 50 MCG/ACT nasal spray       . ipratropium (ATROVENT) 0.06 % nasal spray       . atorvastatin (LIPITOR) 40 MG tablet Take 1 tablet (40 mg total) by mouth every evening.  30 tablet  5  . azelastine (ASTELIN) 137 MCG/SPRAY nasal spray Place 1 spray into the nose 2 (two) times daily as needed for rhinitis. Use in each nostril as directed  30 mL  11  . cloNIDine (CATAPRES) 0.1 MG  tablet Take 1 tablet (0.1 mg total) by mouth 2 (two) times daily.  60 tablet  3  . escitalopram (LEXAPRO) 5 MG tablet Take 1 tablet (5 mg total) by mouth daily.  30 tablet  5  . furosemide (LASIX) 20 MG tablet Take 1 tablet (20 mg total) by mouth daily.  30 tablet  3  . gabapentin (NEURONTIN) 300 MG capsule Take 1 capsule (300 mg total) by mouth 3 (three) times daily.  90 capsule  3  . omeprazole (PRILOSEC) 20 MG capsule Take 1 capsule (20 mg total) by mouth daily.  30 capsule  3  . potassium chloride SA (K-DUR,KLOR-CON) 20 MEQ tablet Take 1 tablet (20 mEq total) by mouth daily.  30 tablet  3   No Known Allergies  Review of Systems  Constitutional: Positive for activity change. Negative for appetite change and unexpected weight change.  Cardiovascular: Positive for leg swelling.  Gastrointestinal: Positive for abdominal pain. Negative for nausea, vomiting, diarrhea and constipation.  Genitourinary: Positive for flank pain. Negative for dysuria, urgency, frequency, hematuria and difficulty urinating.  Musculoskeletal: Positive for myalgias, back pain, joint swelling, arthralgias and  gait problem.  Neurological: Positive for dizziness and weakness.  Hematological: Negative for adenopathy. Does not bruise/bleed easily.  Psychiatric/Behavioral: Negative for sleep disturbance. The patient is nervous/anxious.       BP 140/75  Pulse 82  Temp 98.1 F (36.7 C)  Resp 18  Ht 5' 3.5" (1.613 m)  Wt 197 lb (89.359 kg)  BMI 34.35 kg/m2  SpO2 99% Objective:   Physical Exam  Constitutional: She is oriented to person, place, and time. She appears well-developed and well-nourished. No distress.  HENT:  Head: Normocephalic and atraumatic.  Right Ear: External ear normal.  Left Ear: External ear normal.  Eyes: Conjunctivae normal are normal. No scleral icterus.  Neck: Normal range of motion. Neck supple. No thyromegaly present.  Cardiovascular: Normal rate, regular rhythm, normal heart sounds and  intact distal pulses.   Pulmonary/Chest: Effort normal and breath sounds normal. No respiratory distress.  Musculoskeletal: She exhibits no edema.  Lymphadenopathy:    She has no cervical adenopathy.  Neurological: She is alert and oriented to person, place, and time.  Skin: Skin is warm and dry. She is not diaphoretic. No erythema.  Psychiatric: She has a normal mood and affect. Her behavior is normal.      Results for orders placed in visit on 11/09/11  POCT UA - MICROSCOPIC ONLY      Component Value Range   WBC, Ur, HPF, POC 0-2     RBC, urine, microscopic 0-1     Bacteria, U Microscopic trace     Mucus, UA neg     Epithelial cells, urine per micros 1-3     Crystals, Ur, HPF, POC neg     Casts, Ur, LPF, POC neg     Yeast, UA neg    POCT URINALYSIS DIPSTICK      Component Value Range   Color, UA yellow     Clarity, UA clear     Glucose, UA neg     Bilirubin, UA neg     Ketones, UA neg     Spec Grav, UA 1.015     Blood, UA neg     pH, UA 8.0     Protein, UA neg     Urobilinogen, UA 1.0     Nitrite, UA neg     Leukocytes, UA Negative    MICROALBUMIN, URINE      Component Value Range   Microalb, Ur 0.50  0.00 - 1.89 mg/dL      Risks/benefits of injection reviewed and verbal consent obtained.  Knees positioned at 90 deg flexion, cleaned with betadine x 2.  Anesthesia w/ ethyl chloride cold spray.  Injected with 40mg  of DepoMedrol and 5cc of 1% lidocaine using 21g 1 1/2in needle to inferior lateral approach without complications to left knee.  Initially had problems entering joint space in Right knee so needle changed, knee re-prepped with betadine and cold spray and injection successfully administered by medial inferior approach. Pt tolerated procedure well. No EBL. Assessment & Plan:   1. Abdominal pain, left lower quadrant  POCT UA - Microscopic Only, POCT urinalysis dipstick  2. LEG EDEMA, BILATERAL    3. OSTEOARTHRITIS, KNEE  S/p bilateral cortisone injections today    4. HYPERGLYCEMIA    5. FLANK PAIN, LEFT - UA nml, suspect MSK - rec heat, rest, and gentle stretching POCT UA - Microscopic Only, POCT urinalysis dipstick  6. HIP PAIN, LEFT    7. Diabetes mellitus  Microalbumin, urine  8. Encounter for long-term (current)  use of other medications    9. Anemia

## 2011-11-10 LAB — MICROALBUMIN, URINE: Microalb, Ur: 0.5 mg/dL (ref 0.00–1.89)

## 2012-01-19 ENCOUNTER — Other Ambulatory Visit: Payer: Self-pay | Admitting: Family Medicine

## 2012-02-05 ENCOUNTER — Encounter: Payer: Self-pay | Admitting: Family Medicine

## 2012-02-05 ENCOUNTER — Ambulatory Visit (INDEPENDENT_AMBULATORY_CARE_PROVIDER_SITE_OTHER): Payer: Medicare Other | Admitting: Family Medicine

## 2012-02-05 VITALS — BP 161/76 | HR 80 | Temp 97.9°F | Resp 18 | Ht 63.5 in | Wt 202.0 lb

## 2012-02-05 DIAGNOSIS — E119 Type 2 diabetes mellitus without complications: Secondary | ICD-10-CM

## 2012-02-05 DIAGNOSIS — IMO0002 Reserved for concepts with insufficient information to code with codable children: Secondary | ICD-10-CM

## 2012-02-05 DIAGNOSIS — M1710 Unilateral primary osteoarthritis, unspecified knee: Secondary | ICD-10-CM

## 2012-02-05 DIAGNOSIS — E7800 Pure hypercholesterolemia, unspecified: Secondary | ICD-10-CM

## 2012-02-05 DIAGNOSIS — I10 Essential (primary) hypertension: Secondary | ICD-10-CM

## 2012-02-05 DIAGNOSIS — H8110 Benign paroxysmal vertigo, unspecified ear: Secondary | ICD-10-CM

## 2012-02-05 DIAGNOSIS — F411 Generalized anxiety disorder: Secondary | ICD-10-CM

## 2012-02-05 DIAGNOSIS — D649 Anemia, unspecified: Secondary | ICD-10-CM

## 2012-02-05 MED ORDER — HYDROCODONE-ACETAMINOPHEN 5-325 MG PO TABS: 1.0000 | ORAL_TABLET | Freq: Four times a day (QID) | ORAL | Status: DC | PRN

## 2012-02-05 MED ORDER — FUROSEMIDE 20 MG PO TABS: 20.0000 mg | ORAL_TABLET | Freq: Every day | ORAL | Status: DC

## 2012-02-05 MED ORDER — CLONIDINE HCL 0.1 MG PO TABS: 0.1000 mg | ORAL_TABLET | Freq: Two times a day (BID) | ORAL | Status: DC

## 2012-02-05 MED ORDER — OMEPRAZOLE 20 MG PO CPDR: 20.0000 mg | DELAYED_RELEASE_CAPSULE | Freq: Every day | ORAL | Status: DC

## 2012-02-05 MED ORDER — MECLIZINE HCL 25 MG PO TABS: 25.0000 mg | ORAL_TABLET | Freq: Three times a day (TID) | ORAL | Status: DC | PRN

## 2012-02-05 MED ORDER — POTASSIUM CHLORIDE CRYS ER 20 MEQ PO TBCR: 20.0000 meq | EXTENDED_RELEASE_TABLET | Freq: Every day | ORAL | Status: DC

## 2012-02-05 MED ORDER — TRIAMTERENE-HCTZ 75-50 MG PO TABS: 1.0000 | ORAL_TABLET | Freq: Every day | ORAL | Status: DC

## 2012-02-05 MED ORDER — GABAPENTIN 300 MG PO CAPS: 300.0000 mg | ORAL_CAPSULE | Freq: Three times a day (TID) | ORAL | Status: DC

## 2012-02-05 MED ORDER — AZELASTINE HCL 0.1 % NA SOLN: 1.0000 | Freq: Two times a day (BID) | NASAL | Status: DC | PRN

## 2012-02-05 MED ORDER — ESCITALOPRAM OXALATE 5 MG PO TABS: 5.0000 mg | ORAL_TABLET | Freq: Every day | ORAL | Status: DC

## 2012-02-05 MED ORDER — ATORVASTATIN CALCIUM 40 MG PO TABS: 40.0000 mg | ORAL_TABLET | Freq: Every evening | ORAL | Status: DC

## 2012-02-05 NOTE — Patient Instructions (Addendum)

## 2012-02-05 NOTE — Progress Notes (Signed)
Subjective:    Patient ID: Casey [name], female    DOB: 1935/05/01, 77 y.o.   MRN: [id number] Chief Complaint  Patient presents with  . Follow-up    bilateral knee pain    HPI  Casey [name] is a delightful 77 yo AA woman here for knee injections.  At last OV, we did bilateral corticosteroid injections.  She thinks they helped a little initially but would like to retry them again today.  Casey [name] has not been checking her BP outside of the office.  She has been taking all of her medications as prescribed and brought all of her bottles with her today so we could see what needs to be refilled.  She does have some xanax for anxiety but uses this very rarely and has plenty left.  Past Medical History  Diagnosis Date  . HYPERTENSION, BENIGN ESSENTIAL 03/03/2007  . HYPERCHOLESTEROLEMIA   . SPRAIN&STRAIN OTH SPEC SITES SHOULDER&UPPER ARM   . SINUSITIS, RECURRENT   . Problems with swallowing and mastication   . OSTEOPOROSIS   . OSTEOARTHRITIS, KNEE   . OBESITY   . Microscopic hematuria   . LEG EDEMA, BILATERAL   . HYPERGLYCEMIA   . HIP PAIN, LEFT   . Headache   . FLANK PAIN, LEFT   . FATIGUE   . ESOPHAGEAL STRICTURE   . CYSTOCELE WITH INCOMPLETE UTERINE PROLAPSE   . CONSTIPATION   . BENIGN POSITIONAL VERTIGO   . ARM PAIN, RIGHT   . ANXIETY   . ALLERGIC RHINITIS    Current Outpatient Prescriptions on File Prior to Visit  Medication Sig Dispense Refill  . acetaminophen (TYLENOL) 500 MG tablet Take 500-1,000 mg by mouth every 6 (six) hours as needed. For pain       . ALPRAZolam (XANAX) 0.5 MG tablet take 1 tablet by mouth at bedtime if needed for anxiety  30 tablet  2  . atorvastatin (LIPITOR) 40 MG tablet Take 1 tablet (40 mg total) by mouth every evening.  30 tablet  5  . azelastine (ASTELIN) 137 MCG/SPRAY nasal spray Place 1 spray into the nose 2 (two) times daily as needed for rhinitis. Use in each nostril as directed  30 mL  11  . cholecalciferol (VITAMIN D) 1000 UNITS tablet  Take 1,000 Units by mouth daily.      . cloNIDine (CATAPRES) 0.1 MG tablet Take 1 tablet (0.1 mg total) by mouth 2 (two) times daily.  60 tablet  3  . cyanocobalamin 500 MCG tablet Take 500 mcg by mouth daily.       Marland Kitchen escitalopram (LEXAPRO) 5 MG tablet Take 1 tablet (5 mg total) by mouth daily.  30 tablet  5  . fluticasone (FLONASE) 50 MCG/ACT nasal spray       . furosemide (LASIX) 20 MG tablet Take 1 tablet (20 mg total) by mouth daily.  30 tablet  3  . gabapentin (NEURONTIN) 300 MG capsule Take 1 capsule (300 mg total) by mouth 3 (three) times daily.  90 capsule  3  . ipratropium (ATROVENT) 0.06 % nasal spray       . omeprazole (PRILOSEC) 20 MG capsule Take 1 capsule (20 mg total) by mouth daily.  30 capsule  3  . potassium chloride SA (K-DUR,KLOR-CON) 20 MEQ tablet Take 1 tablet (20 mEq total) by mouth daily.  30 tablet  3  . Blood Glucose Monitoring Suppl (BLOOD GLUCOSE METER) kit NEED REFILLS     Use as instructed      .  No Known Allergies    Review of Systems  Constitutional: Positive for activity change. Negative for fever, chills and unexpected weight change.  Respiratory: Negative for cough and shortness of breath.   Cardiovascular: Positive for leg swelling. Negative for chest pain and palpitations.  Musculoskeletal: Positive for myalgias, joint swelling, arthralgias and gait problem. Negative for back pain.  Skin: Negative for color change and rash.  Neurological: Positive for weakness. Negative for numbness.  Psychiatric/Behavioral: Negative for sleep disturbance and dysphoric mood. The patient is not nervous/anxious.       BP 161/76  Pulse 80  Temp 97.9 F (36.6 C)  Resp 18  Ht 5' 3.5" (1.613 m)  Wt 202 lb (91.627 kg)  BMI 35.22 kg/m2 Objective:   Physical Exam  Constitutional: She is oriented to person, place, and time. She appears well-developed and well-nourished. No distress.  HENT:  Head: Normocephalic and atraumatic.  Right Ear: External ear normal.  Left Ear:  External ear normal.  Eyes: Conjunctivae normal are normal. No scleral icterus.  Neck: Normal range of motion. Neck supple. No thyromegaly present.  Cardiovascular: Normal rate, regular rhythm, normal heart sounds and intact distal pulses.   Pulmonary/Chest: Effort normal and breath sounds normal. No respiratory distress.  Musculoskeletal: Normal range of motion. She exhibits no edema and no tenderness.       Right knee: Normal.       Left knee: Normal.  Lymphadenopathy:    She has no cervical adenopathy.  Neurological: She is alert and oriented to person, place, and time.  Skin: Skin is warm and dry. She is not diaphoretic. No erythema.  Psychiatric: She has a normal mood and affect. Her behavior is normal.     Risks/benefits of injection reviewed and informed consent obtained.  Knees positioned at 10 deg flexion, cleaned with betadine x 1 and alcohol x 1.  Anesthesia w/ ethyl chloride cold spray.  Injected with 40mg  of DepoMedrol and 5cc of 1% lidocaine using 22g 1 1/2in needle in superior lateral approach to suprapatellar bursa without complications bilaterally. Pt tolerated procedure well. No EBL.  Assessment & Plan:  1. Osteoarthritis - s/p bilateral knee injections again today - pt tolerated procedure well.  She is going to try a few vicodin 5mg  (#30 w/ 1 Refill given) to see if these help - warned of fall risk 2. HTN - Elevated today, wide pulse pressure.  Check BP outside office, record, and call in or bring to f/u.  On clonidine, lasix, and maxzide 75/50!!  Consider wean back diuretics at f/u as freq low Na.  Looks like pt was prev on amlodipine 5 but she just stopped taking it so could restart.  Renal function is great so could do acei trial. 3. HPL - Needs flp at f/u 4. DM - Needs hgba1c at f/u 5. Mult meds - needs cmp - unclear if pt has been taking potassium supplement as it is the only medicaiton not in her bag - however, she needs it as she is on lasix and triamt/hctz so  re-rx'ed F/u 3 mos.

## 2012-03-08 ENCOUNTER — Other Ambulatory Visit: Payer: Self-pay | Admitting: Family Medicine

## 2012-03-08 NOTE — Telephone Encounter (Signed)
Forward to Dr. Shaw 

## 2012-03-09 NOTE — Telephone Encounter (Signed)
1 mo prev pt was rx'ed norco #30 with 1 refill - did she use both rxs? - 60 tabs - in 1 mo? Or did she not know she had a refill on the bottle.  OK to refill again with #30 with 1 refill if she did use both rxs but please warn pt of tolerance and dependence of this medication so try not to take to much.

## 2012-03-09 NOTE — Telephone Encounter (Signed)
Pt states that she has used the 60 tabs in 1 month but she does not use every day.  Advised pt of the warnings.  rx called into pharmacy

## 2012-04-19 ENCOUNTER — Other Ambulatory Visit: Payer: Self-pay | Admitting: Family Medicine

## 2012-04-24 NOTE — Telephone Encounter (Signed)
Pt is calling back because she states the pharmacy hasn't received anything for her Rx refill,

## 2012-04-25 MED ORDER — ALPRAZOLAM 0.5 MG PO TABS: 0.5000 mg | ORAL_TABLET | Freq: Every evening | ORAL | Status: DC | PRN

## 2012-04-25 NOTE — Telephone Encounter (Signed)
Ok to refill for #30 tabs with 1 refill. Pt is due to a f/u OV before any further refills.

## 2012-04-25 NOTE — Addendum Note (Signed)
Addended by: Sheppard Plumber A on: 04/25/2012 02:51 PM   Modules accepted: Orders

## 2012-04-25 NOTE — Telephone Encounter (Signed)
Checked w/pharmacist because I did not see that Rx had been called in. Pharmacist reported they did not receive RF. I called in RFs as authorized by Dr Clelia Croft. Putting order into [facility]

## 2012-05-04 ENCOUNTER — Other Ambulatory Visit: Payer: Self-pay

## 2012-05-04 DIAGNOSIS — E7800 Pure hypercholesterolemia, unspecified: Secondary | ICD-10-CM

## 2012-05-04 MED ORDER — ATORVASTATIN CALCIUM 40 MG PO TABS: 40.0000 mg | ORAL_TABLET | Freq: Every evening | ORAL | Status: DC

## 2012-05-09 ENCOUNTER — Encounter: Payer: Self-pay | Admitting: Family Medicine

## 2012-05-26 ENCOUNTER — Ambulatory Visit (INDEPENDENT_AMBULATORY_CARE_PROVIDER_SITE_OTHER): Payer: Medicare Other | Admitting: Family Medicine

## 2012-05-26 VITALS — BP 127/67 | HR 76 | Temp 97.9°F | Resp 16 | Ht 63.5 in | Wt 202.0 lb

## 2012-05-26 DIAGNOSIS — E119 Type 2 diabetes mellitus without complications: Secondary | ICD-10-CM

## 2012-05-26 DIAGNOSIS — Z79899 Other long term (current) drug therapy: Secondary | ICD-10-CM

## 2012-05-26 DIAGNOSIS — E871 Hypo-osmolality and hyponatremia: Secondary | ICD-10-CM

## 2012-05-26 DIAGNOSIS — D649 Anemia, unspecified: Secondary | ICD-10-CM

## 2012-05-26 DIAGNOSIS — F411 Generalized anxiety disorder: Secondary | ICD-10-CM

## 2012-05-26 DIAGNOSIS — IMO0002 Reserved for concepts with insufficient information to code with codable children: Secondary | ICD-10-CM

## 2012-05-26 DIAGNOSIS — R5381 Other malaise: Secondary | ICD-10-CM

## 2012-05-26 DIAGNOSIS — E1129 Type 2 diabetes mellitus with other diabetic kidney complication: Secondary | ICD-10-CM | POA: Insufficient documentation

## 2012-05-26 DIAGNOSIS — E7800 Pure hypercholesterolemia, unspecified: Secondary | ICD-10-CM

## 2012-05-26 DIAGNOSIS — M1710 Unilateral primary osteoarthritis, unspecified knee: Secondary | ICD-10-CM

## 2012-05-26 DIAGNOSIS — J309 Allergic rhinitis, unspecified: Secondary | ICD-10-CM

## 2012-05-26 DIAGNOSIS — R7309 Other abnormal glucose: Secondary | ICD-10-CM

## 2012-05-26 DIAGNOSIS — H8110 Benign paroxysmal vertigo, unspecified ear: Secondary | ICD-10-CM

## 2012-05-26 DIAGNOSIS — J329 Chronic sinusitis, unspecified: Secondary | ICD-10-CM

## 2012-05-26 DIAGNOSIS — I10 Essential (primary) hypertension: Secondary | ICD-10-CM

## 2012-05-26 LAB — CBC WITH DIFFERENTIAL/PLATELET
Basophils Absolute: 0.1 10*3/uL (ref 0.0–0.1)
Eosinophils Absolute: 0.3 10*3/uL (ref 0.0–0.7)
Eosinophils Relative: 5 % (ref 0–5)
HCT: 34 % — ABNORMAL LOW (ref 36.0–46.0)
Lymphs Abs: 2.3 10*3/uL (ref 0.7–4.0)
MCH: 27.3 pg (ref 26.0–34.0)
MCV: 81.5 fL (ref 78.0–100.0)
Monocytes Absolute: 0.6 10*3/uL (ref 0.1–1.0)
Platelets: 319 10*3/uL (ref 150–400)
RDW: 14.6 % (ref 11.5–15.5)

## 2012-05-26 LAB — COMPREHENSIVE METABOLIC PANEL
BUN: 7 mg/dL (ref 6–23)
CO2: 28 mEq/L (ref 19–32)
Calcium: 9.5 mg/dL (ref 8.4–10.5)
Chloride: 94 mEq/L — ABNORMAL LOW (ref 96–112)
Creat: 0.92 mg/dL (ref 0.50–1.10)
Glucose, Bld: 128 mg/dL — ABNORMAL HIGH (ref 70–99)

## 2012-05-26 LAB — LIPID PANEL
Cholesterol: 153 mg/dL (ref 0–200)
HDL: 63 mg/dL (ref >39)
Total CHOL/HDL Ratio: 2.4 Ratio

## 2012-05-26 MED ORDER — CLONIDINE HCL 0.1 MG PO TABS: 0.1000 mg | ORAL_TABLET | Freq: Two times a day (BID) | ORAL | Status: DC

## 2012-05-26 MED ORDER — TRIAMTERENE-HCTZ 37.5-25 MG PO TABS: 1.0000 | ORAL_TABLET | Freq: Every day | ORAL | Status: DC

## 2012-05-26 MED ORDER — POTASSIUM CHLORIDE CRYS ER 20 MEQ PO TBCR: 20.0000 meq | EXTENDED_RELEASE_TABLET | Freq: Every day | ORAL | Status: DC

## 2012-05-26 MED ORDER — IPRATROPIUM BROMIDE 0.06 % NA SOLN: 2.0000 | Freq: Four times a day (QID) | NASAL | Status: DC | PRN

## 2012-05-26 MED ORDER — GABAPENTIN 300 MG PO CAPS: 300.0000 mg | ORAL_CAPSULE | Freq: Three times a day (TID) | ORAL | Status: DC

## 2012-05-26 MED ORDER — ESCITALOPRAM OXALATE 5 MG PO TABS: 5.0000 mg | ORAL_TABLET | Freq: Every day | ORAL | Status: DC

## 2012-05-26 MED ORDER — MECLIZINE HCL 25 MG PO TABS: 25.0000 mg | ORAL_TABLET | Freq: Three times a day (TID) | ORAL | Status: DC | PRN

## 2012-05-26 MED ORDER — ALPRAZOLAM 0.5 MG PO TABS: 0.5000 mg | ORAL_TABLET | Freq: Every evening | ORAL | Status: DC | PRN

## 2012-05-26 MED ORDER — ATORVASTATIN CALCIUM 40 MG PO TABS: 40.0000 mg | ORAL_TABLET | Freq: Every evening | ORAL | Status: DC

## 2012-05-26 MED ORDER — FUROSEMIDE 20 MG PO TABS: 20.0000 mg | ORAL_TABLET | Freq: Every day | ORAL | Status: DC

## 2012-05-26 MED ORDER — OMEPRAZOLE 20 MG PO CPDR: 20.0000 mg | DELAYED_RELEASE_CAPSULE | Freq: Every day | ORAL | Status: DC

## 2012-05-26 MED ORDER — HYDROCODONE-ACETAMINOPHEN 5-325 MG PO TABS: ORAL_TABLET | ORAL | Status: DC

## 2012-05-26 MED ORDER — FLUTICASONE PROPIONATE 50 MCG/ACT NA SUSP: 2.0000 | Freq: Every day | NASAL | Status: DC

## 2012-05-26 NOTE — Patient Instructions (Addendum)
Diabetes and Exercise Regular exercise is important and can help:   Control blood glucose (sugar).  Decrease blood pressure.    Control blood lipids (cholesterol, triglycerides).  Improve overall health. BENEFITS FROM EXERCISE  Improved fitness.  Improved flexibility.  Improved endurance.  Increased bone density.  Weight control.  Increased muscle strength.  Decreased body fat.  Improvement of the body's use of insulin, a hormone.  Increased insulin sensitivity.  Reduction of insulin needs.  Reduced stress and tension.  Helps you feel better. People with diabetes who add exercise to their lifestyle gain additional benefits, including:  Weight loss.  Reduced appetite.  Improvement of the body's use of blood glucose.  Decreased risk factors for heart disease:  Lowering of cholesterol and triglycerides.  Raising the level of good cholesterol (high-density lipoproteins, HDL).  Lowering blood sugar.  Decreased blood pressure. TYPE 1 DIABETES AND EXERCISE  Exercise will usually lower your blood glucose.  If blood glucose is greater than 240 mg/dl, check urine ketones. If ketones are present, do not exercise.  Location of the insulin injection sites may need to be adjusted with exercise. Avoid injecting insulin into areas of the body that will be exercised. For example, avoid injecting insulin into:  The arms when playing tennis.  The legs when jogging. For more information, discuss this with your caregiver.  Keep a record of:  Food intake.  Type and amount of exercise.  Expected peak times of insulin action.  Blood glucose levels. Do this before, during, and after exercise. Review your records with your caregiver. This will help you to develop guidelines for adjusting food intake and insulin amounts.  TYPE 2 DIABETES AND EXERCISE  Regular physical activity can help control blood glucose.  Exercise is important because it may:  Increase the  body's sensitivity to insulin.  Improve blood glucose control.  Exercise reduces the risk of heart disease. It decreases serum cholesterol and triglycerides. It also lowers blood pressure.  Those who take insulin or oral hypoglycemic agents should watch for signs of hypoglycemia. These signs include dizziness, shaking, sweating, chills, and confusion.  Body water is lost during exercise. It must be replaced. This will help to avoid loss of body fluids (dehydration) or heat stroke. Be sure to talk to your caregiver before starting an exercise program to make sure it is safe for you. Remember, any activity is better than none.  Document Released: 03/28/2003 Document Revised: 03/30/2011 Document Reviewed: 07/12/2008 [facility] Patient Information 2013 [place], [place]. Diabetes Meal Planning Guide The diabetes meal planning guide is a tool to help you plan your meals and snacks. It is important for people with diabetes to manage their blood glucose (sugar) levels. Choosing the right foods and the right amounts throughout your day will help control your blood glucose. Eating right can even help you improve your blood pressure and reach or maintain a healthy weight. CARBOHYDRATE COUNTING MADE EASY When you eat carbohydrates, they turn to sugar. This raises your blood glucose level. Counting carbohydrates can help you control this level so you feel better. When you plan your meals by counting carbohydrates, you can have more flexibility in what you eat and balance your medicine with your food intake. Carbohydrate counting simply means adding up the total amount of carbohydrate grams in your meals and snacks. Try to eat about the same amount at each meal. Foods with carbohydrates are listed below. Each portion below is 1 carbohydrate serving or 15 grams of carbohydrates. Ask your dietician how many  grams of carbohydrates you should eat at each meal or snack. Grains and Starches  1 slice bread.   English  muffin or hotdog/hamburger bun.   cup cold cereal (unsweetened).   cup cooked pasta or rice.   cup starchy vegetables (corn, potatoes, peas, beans, winter squash).  1 tortilla (6 inches).   bagel.  1 waffle or pancake (size of a CD).   cup cooked cereal.  4 to 6 small crackers. *Whole grain is recommended. Fruit  1 cup fresh unsweetened berries, melon, papaya, pineapple.  1 small fresh fruit.   banana or mango.   cup fruit juice (4 oz unsweetened).   cup canned fruit in natural juice or water.  2 tbs dried fruit.  12 to 15 grapes or cherries. Milk and Yogurt  1 cup fat-free or 1% milk.  1 cup soy milk.  6 oz light yogurt with sugar-free sweetener.  6 oz low-fat soy yogurt.  6 oz plain yogurt. Vegetables  1 cup raw or  cup cooked is counted as 0 carbohydrates or a "free" food.  If you eat 3 or more servings at 1 meal, count them as 1 carbohydrate serving. Other Carbohydrates   oz chips or pretzels.   cup ice cream or frozen yogurt.   cup sherbet or sorbet.  2 inch square cake, no frosting.  1 tbs honey, sugar, jam, jelly, or syrup.  2 small cookies.  3 squares of graham crackers.  3 cups popcorn.  6 crackers.  1 cup broth-based soup.  Count 1 cup casserole or other mixed foods as 2 carbohydrate servings.  Foods with less than 20 calories in a serving may be counted as 0 carbohydrates or a "free" food. You may want to purchase a book or computer software that lists the carbohydrate gram counts of different foods. In addition, the nutrition facts panel on the labels of the foods you eat are a good source of this information. The label will tell you how big the serving size is and the total number of carbohydrate grams you will be eating per serving. Divide this number by 15 to obtain the number of carbohydrate servings in a portion. Remember, 1 carbohydrate serving equals 15 grams of carbohydrate. SERVING SIZES Measuring foods and  serving sizes helps you make sure you are getting the right amount of food. The list below tells how big or small some common serving sizes are.  1 oz.........4 stacked dice.  3 oz........[place] KitchenDeck of cards.  1 tsp.......Mar[place] KitchenTip of little finger.  1 tbs......Marland KitchenMarland KitchenThumb.  2 tbs.......Marland KitchenGolf ball.   cup......Marland KitchenHalf of a fist.  1 cup.......Marland KitchenA fist. SAMPLE DIABETES MEAL PLAN Below is a sample meal plan that includes foods from the grain and starches, dairy, vegetable, fruit, and meat groups. A dietician can individualize a meal plan to fit your calorie needs and tell you the number of servings needed from each food group. However, controlling the total amount of carbohydrates in your meal or snack is more important than making sure you include all of the food groups at every meal. You may interchange carbohydrate containing foods (dairy, starches, and fruits). The meal plan below is an example of a 2000 calorie diet using carbohydrate counting. This meal plan has 17 carbohydrate servings. Breakfast  1 cup oatmeal (2 carb servings).   cup light yogurt (1 carb serving).  1 cup blueberries (1 carb serving).   cup almonds. Snack  1 large apple (2 carb servings).  1 low-fat string cheese stick. Lunch  Chicken  breast salad.  1 cup spinach.   cup chopped tomatoes.  2 oz chicken breast, sliced.  2 tbs low-fat [place] dressing.  12 whole-wheat crackers (2 carb servings).  12 to 15 grapes (1 carb serving).  1 cup low-fat milk (1 carb serving). Snack  1 cup carrots.   cup hummus (1 carb serving). Dinner  3 oz broiled salmon.  1 cup brown rice (3 carb servings). Snack  1  cups steamed broccoli (1 carb serving) drizzled with 1 tsp olive oil and lemon juice.  1 cup light pudding (2 carb servings). DIABETES MEAL PLANNING WORKSHEET Your dietician can use this worksheet to help you decide how many servings of foods and what types of foods are right for you.  BREAKFAST Food  Group and Servings / Carb Servings Grain/Starches __________________________________ Dairy __________________________________________ Vegetable ______________________________________ Fruit ___________________________________________ Meat __________________________________________ Fat ____________________________________________ LUNCH Food Group and Servings / Carb Servings Grain/Starches ___________________________________ Dairy ___________________________________________ Fruit ____________________________________________ Meat ___________________________________________ Fat _____________________________________________ [place] Food Group and Servings / Carb Servings Grain/Starches ___________________________________ Dairy ___________________________________________ Fruit ____________________________________________ Meat ___________________________________________ Fat _____________________________________________ SNACKS Food Group and Servings / Carb Servings Grain/Starches ___________________________________ Dairy ___________________________________________ Vegetable _______________________________________ Fruit ____________________________________________ Meat ___________________________________________ Fat _____________________________________________ DAILY TOTALS Starches _________________________ Vegetable ________________________ Fruit ____________________________ Dairy ____________________________ Meat ____________________________ Fat ______________________________ Document Released: 10/02/2004 Document Revised: 03/30/2011 Document Reviewed: 08/13/2008 [employer] Patient Information 2013 [place], [place]. Blood Sugar Monitoring, Adult GLUCOSE METERS FOR SELF-MONITORING OF BLOOD GLUCOSE  It is important to be able to correctly measure your blood sugar (glucose). You can use a blood glucose monitor (a small battery-operated device) to check your glucose level at any time. This  allows you and your caregiver to monitor your diabetes and to determine how well your treatment plan is working. The process of monitoring your blood glucose with a glucose meter is called self-monitoring of blood glucose (SMBG). When people with diabetes control their blood sugar, they have better health. To test for glucose with a typical glucose meter, place the disposable strip in the meter. Then place a small sample of blood on the "test strip." The test strip is coated with chemicals that combine with glucose in blood. The meter measures how much glucose is present. The meter displays the glucose level as a number. Several new models can record and store a number of test results. Some models can connect to personal computers to store test results or print them out.  Newer meters are often easier to use than older models. Some meters allow you to get blood from places other than your fingertip. Some new models have automatic timing, error codes, signals, or barcode readers to help with proper adjustment (calibration). Some meters have a large display screen or spoken instructions for people with visual impairments.  INSTRUCTIONS FOR USING GLUCOSE METERS  Wash your hands with soap and warm water, or clean the area with alcohol. Dry your hands completely.  Prick the side of your fingertip with a lancet (a sharp-pointed tool used by hand).  Hold the hand down and gently milk the finger until a small drop of blood appears. Catch the blood with the test strip.  Follow the instructions for inserting the test strip and using the SMBG meter. Most meters require the meter to be turned on and the test strip to be inserted before applying the blood sample.  Record the test result.  Read the instructions carefully for both the meter and the test strips that go with it. Meter instructions are found in the user manual.  Keep this manual to help you solve any problems that may arise. Many meters use "error  codes" when there is a problem with the meter, the test strip, or the blood sample on the strip. You will need the manual to understand these error codes and fix the problem.  New devices are available such as laser lancets and meters that can test blood taken from "alternative sites" of the body, other than fingertips. However, you should use standard fingertip testing if your glucose changes rapidly. Also, use standard testing if:  You have eaten, exercised, or taken insulin in the past 2 hours.  You think your glucose is low.  You tend to not feel symptoms of low blood glucose (hypoglycemia).  You are ill or under stress.  Clean the meter as directed by the manufacturer.  Test the meter for accuracy as directed by the manufacturer.  Take your meter with you to your caregiver's office. This way, you can test your glucose in front of your caregiver to make sure you are using the meter correctly. Your caregiver can also take a sample of blood to test using a routine lab method. If values on the glucose meter are close to the lab results, you and your caregiver will see that your meter is working well and you are using good technique. Your caregiver will advise you about what to do if the results do not match. FREQUENCY OF TESTING  Your caregiver will tell you how often you should check your blood glucose. This will depend on your type of diabetes, your current level of diabetes control, and your types of medicines. The following are general guidelines, but your care plan may be different. Record all your readings and the time of day you took them for review with your caregiver.   Diabetes type 1.  When you are using insulin with good diabetic control (either multiple daily injections or via a pump), you should check your glucose 4 times a day.  If your diabetes is not well controlled, you may need to monitor more frequently, including before meals and 2 hours after meals, at bedtime, and  occasionally between 2 a.m. and 3 a.m.  You should always check your glucose before a dose of insulin or before changing the rate on your insulin pump.  Diabetes type 2.  Guidelines for SMBG in diabetes type 2 are not as well defined.  If you are on insulin, follow the guidelines above.  If you are on medicines, but not insulin, and your glucose is not well controlled, you should test at least twice daily.  If you are not on insulin, and your diabetes is controlled with medicines or diet alone, you should test at least once daily, usually before breakfast.  A weekly profile will help your caregiver advise you on your care plan. The week before your visit, check your glucose before a meal and 2 hours after a meal at least daily. You may want to test before and after a different meal each day so you and your caregiver can tell how well controlled your blood sugars are throughout the course of a 24 hour period.  Gestational diabetes (diabetes during pregnancy).  Frequent testing is often necessary. Accurate timing is important.  If you are not on insulin, check your glucose 4 times a day. Check it before breakfast and 1 hour after the start of each meal.  If you are on insulin, check your glucose 6 times a day. Check it before  each meal and 1 hour after the first bite of each meal.  General guidelines.  More frequent testing is required at the start of insulin treatment. Your caregiver will instruct you.  Test your glucose any time you suspect you have low blood sugar (hypoglycemia).  You should test more often when you change medicines, when you have unusual stress or illness, or in other unusual circumstances. OTHER THINGS TO KNOW ABOUT GLUCOSE METERS  Measurement Range. Most glucose meters are able to read glucose levels over a broad range of values from as low as 0 to as high as 600 mg/dL. If you get an extremely high or low reading from your meter, you should first confirm it with  another reading. Report very high or very low readings to your caregiver.  Whole Blood Glucose versus Plasma Glucose. Some older home glucose meters measure glucose in your whole blood. In a lab or when using some newer home glucose meters, the glucose is measured in your plasma (one component of blood). The difference can be important. It is important for you and your caregiver to know whether your meter gives its results as "whole blood equivalent" or "plasma equivalent."  Display of High and Low Glucose Values. Part of learning how to operate a meter is understanding what the meter results mean. Know how high and low glucose concentrations are displayed on your meter.  Factors that Affect Glucose Meter Performance. The accuracy of your test results depends on many factors and varies depending on the brand and type of meter. These factors include:  Low red blood cell count (anemia).  Substances in your blood (such as uric acid, vitamin C, and others).  Environmental factors (temperature, humidity, altitude).  Name-brand versus generic test strips.  Calibration. Make sure your meter is set up properly. It is a good idea to do a calibration test with a control solution recommended by the manufacturer of your meter whenever you begin using a fresh bottle of test strips. This will help verify the accuracy of your meter.  Improperly stored, expired, or defective test strips. Keep your strips in a dry place with the lid on.  Soiled meter.  Inadequate blood sample. NEW TECHNOLOGIES FOR GLUCOSE TESTING Alternative site testing Some glucose meters allow testing blood from alternative sites. These include the:  Upper arm.  Forearm.  Base of the thumb.  Thigh. Sampling blood from alternative sites may be desirable. However, it may have some limitations. Blood in the fingertips show changes in glucose levels more quickly than blood in other parts of the body. This means that alternative site  test results may be different from fingertip test results, not because of the meter's ability to test accurately, but because the actual glucose concentration can be different.  Continuous Glucose Monitoring Devices to measure your blood glucose continuously are available, and others are in development. These methods can be more expensive than self-monitoring with a glucose meter. However, it is uncertain how effective and reliable these devices are. Your caregiver will advise you if this approach makes sense for you. IF BLOOD SUGARS ARE CONTROLLED, PEOPLE WITH DIABETES REMAIN HEALTHIER.  SMBG is an important part of the treatment plan of patients with diabetes mellitus. Below are reasons for using SMBG:   It confirms that your glucose is at a specific, healthy level.  It detects hypoglycemia and severe hyperglycemia.  It allows you and your caregiver to make adjustments in response to changes in lifestyle for individuals requiring medicine.  It  determines the need for starting insulin therapy in temporary diabetes that happens during pregnancy (gestational diabetes). Document Released: 01/08/2003 Document Revised: 03/30/2011 Document Reviewed: 05/01/2010 [facility] Patient Information 2013 [place], [place].

## 2012-05-26 NOTE — Progress Notes (Addendum)
Subjective:    Patient ID: Casey [name], female    DOB: 03-08-1935, 77 y.o.   MRN: [id number] Chief Complaint  Patient presents with  . Knee Pain    follow up  . Medication Refill    HPI  At least partial pain relief from prior cortisone knee injections lasted for about 2 mos so would like to repeat today.  Requests refill on meds.  Past Medical History  Diagnosis Date  . HYPERTENSION, BENIGN ESSENTIAL 03/03/2007  . HYPERCHOLESTEROLEMIA   . SPRAIN&STRAIN OTH SPEC SITES SHOULDER&UPPER ARM   . SINUSITIS, RECURRENT   . Problems with swallowing and mastication   . OSTEOPOROSIS   . OSTEOARTHRITIS, KNEE   . OBESITY   . Microscopic hematuria   . LEG EDEMA, BILATERAL   . HYPERGLYCEMIA   . HIP PAIN, LEFT   . Headache(784.0)   . FLANK PAIN, LEFT   . FATIGUE   . ESOPHAGEAL STRICTURE   . CYSTOCELE WITH INCOMPLETE UTERINE PROLAPSE   . CONSTIPATION   . BENIGN POSITIONAL VERTIGO   . ARM PAIN, RIGHT   . ANXIETY   . ALLERGIC RHINITIS   . Carcinoma in situ of colon   . Peripheral neuropathy, idiopathic    Current Outpatient Prescriptions on File Prior to Visit  Medication Sig Dispense Refill  . cholecalciferol (VITAMIN D) 1000 UNITS tablet Take 1,000 Units by mouth daily.      . cyanocobalamin 500 MCG tablet Take 500 mcg by mouth daily.       Marland Kitchen azelastine (ASTELIN) 137 MCG/SPRAY nasal spray Place 1 spray into the nose 2 (two) times daily as needed for rhinitis. Use in each nostril as directed  30 mL  11  . Blood Glucose Monitoring Suppl (BLOOD GLUCOSE METER) kit NEED REFILLS     Use as instructed       No current facility-administered medications on file prior to visit.   No Known Allergies  Review of Systems  Constitutional: Positive for activity change. Negative for fever, chills and unexpected weight change.  Respiratory: Negative for cough and shortness of breath.   Cardiovascular: Negative for chest pain and palpitations.  Musculoskeletal: Positive for arthralgias, gait  problem, joint swelling and myalgias.  Skin: Negative for color change and rash.  Neurological: Negative for weakness and numbness.      BP 127/67  Pulse 76  Temp(Src) 97.9 F (36.6 C) (Oral)  Resp 16  Ht 5' 3.5" (1.613 m)  Wt 202 lb (91.627 kg)  BMI 35.22 kg/m2  SpO2 96% Objective:   Physical Exam  Constitutional: She is oriented to person, place, and time. She appears well-developed and well-nourished. No distress.  HENT:  Head: Normocephalic and atraumatic.  Right Ear: External ear normal.  Left Ear: External ear normal.  Eyes: Conjunctivae are normal. No scleral icterus.  Neck: Normal range of motion. Neck supple. No thyromegaly present.  Cardiovascular: Normal rate, regular rhythm, normal heart sounds and intact distal pulses.   Pulmonary/Chest: Effort normal and breath sounds normal. No respiratory distress.  Musculoskeletal: She exhibits no edema.       Right knee: She exhibits effusion. She exhibits normal range of motion, no erythema, normal alignment and normal patellar mobility. Tenderness found.       Left knee: She exhibits effusion. She exhibits normal range of motion, no erythema, normal alignment and normal patellar mobility. Tenderness found.  Lymphadenopathy:    She has no cervical adenopathy.  Neurological: She is alert and oriented to person, place, and  time.  Skin: Skin is warm and dry. She is not diaphoretic. No erythema.  Psychiatric: She has a normal mood and affect. Her behavior is normal.    Risks/benefits of aspiration and injection reviewed and informed consent obtained.  Bilateral knees positioned at 10 deg flexion, cleaned with alcohol x 2 followed by betadine x 2.  Anesthesia w/ ethyl chloride cold spray.   Each knee injected with 40mg  of DepoMedrol and 5cc of 1% lidocaine using 21g 1 1/2in needle in superior lateral approach to suprapatellar bursa without complications. Pt tolerated procedures well. No EBL.      Results for orders placed in visit  on 02/05/12  GLUCOSE, POCT (MANUAL RESULT ENTRY)      Result Value Range   POC Glucose 133 (*) 70 - 99 mg/dl    Assessment & Plan:  HYPERTENSION, BENIGN ESSENTIAL - Plan: furosemide (LASIX) 20 MG tablet, potassium chloride SA (K-DUR,KLOR-CON) 20 MEQ tablet,  cloNIDine (CATAPRES) 0.1 MG tablet  SINUSITIS, RECURRENT  OSTEOARTHRITIS, KNEE - Plan: Ambulatory referral to Physical Therapy  HYPERGLYCEMIA - Plan: POCT glucose (manual entry), POCT glycosylated hemoglobin (Hb A1C)  Hyponatremia  HYPERCHOLESTEROLEMIA - Plan: Lipid panel, atorvastatin (LIPITOR) 40 MG tablet  FATIGUE  Encounter for long-term (current) use of other medications - Plan: Comprehensive metabolic panel  Anemia - Plan: CBC with Differential, Hemoglobinopathy evaluation  ANXIETY - Plan: refilled escitalopram (LEXAPRO) 5 MG tablet  ALLERGIC RHINITIS  BENIGN POSITIONAL VERTIGO - Plan: seems to be occasional and chronic so refilled meclizine (ANTIVERT) 25 MG tablet  Type II or unspecified type diabetes mellitus without mention of complication, not stated as uncontrolled - a1c worsening - was 6.1 - now increased to 7 - if continues to increase at all, will need to discuss medications. Reviewed diabetic diet and pt will work on decreasing sugars and complex carbs.  Meds ordered this encounter  Medications  . DISCONTD: fluticasone (VERAMYST) 27.5 MCG/SPRAY nasal spray    Sig: Place 2 sprays into the nose daily.  Marland Kitchen HYDROcodone-acetaminophen (NORCO/VICODIN) 5-325 MG per tablet    Sig: take 1 tablet by mouth every 6 hours if needed for pain    Dispense:  30 tablet    Refill:  2  . triamterene-hydrochlorothiazide (MAXZIDE-25) 37.5-25 MG per tablet    Sig: Take 1 tablet by mouth daily.    Dispense:  90 tablet    Refill:  1  . ALPRAZolam (XANAX) 0.5 MG tablet    Sig: Take 1 tablet (0.5 mg total) by mouth at bedtime as needed for sleep or anxiety.    Dispense:  30 tablet    Refill:  1  . atorvastatin (LIPITOR) 40 MG  tablet    Sig: Take 1 tablet (40 mg total) by mouth every evening.    Dispense:  90 tablet    Refill:  0  . cloNIDine (CATAPRES) 0.1 MG tablet    Sig: Take 1 tablet (0.1 mg total) by mouth 2 (two) times daily.    Dispense:  60 tablet    Refill:  3  . escitalopram (LEXAPRO) 5 MG tablet    Sig: Take 1 tablet (5 mg total) by mouth daily.    Dispense:  30 tablet    Refill:  5  . fluticasone (FLONASE) 50 MCG/ACT nasal spray    Sig: Place 2 sprays into the nose daily.    Dispense:  16 g    Refill:  5  . furosemide (LASIX) 20 MG tablet    Sig: Take 1 tablet (  20 mg total) by mouth daily.    Dispense:  30 tablet    Refill:  3  . gabapentin (NEURONTIN) 300 MG capsule    Sig: Take 1 capsule (300 mg total) by mouth 3 (three) times daily.    Dispense:  90 capsule    Refill:  3  . ipratropium (ATROVENT) 0.06 % nasal spray    Sig: Place 2 sprays into the nose 4 (four) times daily as needed for rhinitis.    Dispense:  15 mL    Refill:  5  . meclizine (ANTIVERT) 25 MG tablet    Sig: Take 1 tablet (25 mg total) by mouth 3 (three) times daily as needed.    Dispense:  30 tablet    Refill:  2  . omeprazole (PRILOSEC) 20 MG capsule    Sig: Take 1 capsule (20 mg total) by mouth daily.    Dispense:  30 capsule    Refill:  3  . potassium chloride SA (K-DUR,KLOR-CON) 20 MEQ tablet    Sig: Take 1 tablet (20 mEq total) by mouth daily.    Dispense:  30 tablet    Refill:  3    [name], MD MPH

## 2012-05-27 ENCOUNTER — Other Ambulatory Visit: Payer: Self-pay

## 2012-05-27 DIAGNOSIS — Z1231 Encounter for screening mammogram for malignant neoplasm of breast: Secondary | ICD-10-CM

## 2012-05-30 LAB — HEMOGLOBINOPATHY EVALUATION: Hgb S Quant: 0 %

## 2012-06-30 ENCOUNTER — Ambulatory Visit
Admission: RE | Admit: 2012-06-30 | Discharge: 2012-06-30 | Disposition: A | Discharge status: Home / Self Care | Payer: Medicare Other | Source: Ambulatory Visit

## 2012-06-30 DIAGNOSIS — Z1231 Encounter for screening mammogram for malignant neoplasm of breast: Secondary | ICD-10-CM

## 2012-07-27 ENCOUNTER — Encounter: Payer: Self-pay | Admitting: *Deleted

## 2012-08-02 ENCOUNTER — Other Ambulatory Visit: Payer: Self-pay | Admitting: Family Medicine

## 2012-08-10 ENCOUNTER — Other Ambulatory Visit: Payer: Self-pay | Admitting: Obstetrics and Gynecology

## 2012-08-23 ENCOUNTER — Other Ambulatory Visit: Payer: Self-pay | Admitting: Family Medicine

## 2012-08-29 ENCOUNTER — Other Ambulatory Visit: Payer: Self-pay | Admitting: Family Medicine

## 2012-08-30 NOTE — Telephone Encounter (Signed)
Forward to Dr. Shaw 

## 2012-09-23 ENCOUNTER — Encounter: Payer: Medicare Other | Admitting: Family Medicine

## 2012-10-18 ENCOUNTER — Other Ambulatory Visit: Payer: Self-pay | Admitting: Family Medicine

## 2012-10-20 NOTE — Telephone Encounter (Signed)
refilled x 1 only. Needs OV before any further refills. Has OV sched on 10/24 for CPE.

## 2012-10-26 ENCOUNTER — Other Ambulatory Visit: Payer: Self-pay | Admitting: Family Medicine

## 2012-10-29 ENCOUNTER — Other Ambulatory Visit: Payer: Self-pay | Admitting: Family Medicine

## 2012-11-01 ENCOUNTER — Telehealth: Payer: Self-pay | Admitting: Radiology

## 2012-11-01 NOTE — Telephone Encounter (Signed)
Patient requesting the Alprazolam refill. She has appt scheduled with you on 11/11/12. Please advise.

## 2012-11-02 MED ORDER — ALPRAZOLAM 0.5 MG PO TABS: ORAL_TABLET | ORAL | Status: DC

## 2012-11-02 NOTE — Telephone Encounter (Signed)
Refill printed 

## 2012-11-02 NOTE — Telephone Encounter (Signed)
faxed

## 2012-11-11 ENCOUNTER — Encounter: Payer: Medicare Other | Admitting: Family Medicine

## 2012-11-23 ENCOUNTER — Ambulatory Visit (INDEPENDENT_AMBULATORY_CARE_PROVIDER_SITE_OTHER): Payer: Medicare Other | Admitting: Emergency Medicine

## 2012-11-23 VITALS — BP 167/92 | HR 78 | Temp 98.4°F | Resp 18 | Wt 189.0 lb

## 2012-11-23 DIAGNOSIS — E1059 Type 1 diabetes mellitus with other circulatory complications: Secondary | ICD-10-CM

## 2012-11-23 DIAGNOSIS — E119 Type 2 diabetes mellitus without complications: Secondary | ICD-10-CM

## 2012-11-23 DIAGNOSIS — M25569 Pain in unspecified knee: Secondary | ICD-10-CM

## 2012-11-23 DIAGNOSIS — E871 Hypo-osmolality and hyponatremia: Secondary | ICD-10-CM

## 2012-11-23 DIAGNOSIS — I10 Essential (primary) hypertension: Secondary | ICD-10-CM

## 2012-11-23 DIAGNOSIS — Z23 Encounter for immunization: Secondary | ICD-10-CM

## 2012-11-23 DIAGNOSIS — G8929 Other chronic pain: Secondary | ICD-10-CM

## 2012-11-23 LAB — COMPREHENSIVE METABOLIC PANEL
ALT: 15 U/L (ref 0–35)
CO2: 27 mEq/L (ref 19–32)
Calcium: 10.4 mg/dL (ref 8.4–10.5)
Chloride: 91 mEq/L — ABNORMAL LOW (ref 96–112)
Sodium: 129 mEq/L — ABNORMAL LOW (ref 135–145)
Total Protein: 7.8 g/dL (ref 6.0–8.3)

## 2012-11-23 LAB — POCT GLYCOSYLATED HEMOGLOBIN (HGB A1C): Hemoglobin A1C: 5.8

## 2012-11-23 LAB — POCT CBC
Granulocyte percent: 53.3 %G (ref 37–80)
HCT, POC: 39.8 % (ref 37.7–47.9)
MCV: 86.1 fL (ref 80–97)
POC LYMPH PERCENT: 38.5 %L (ref 10–50)
RBC: 4.62 M/uL (ref 4.04–5.48)

## 2012-11-23 LAB — LIPID PANEL: Cholesterol: 162 mg/dL (ref 0–200)

## 2012-11-23 MED ORDER — MECLIZINE HCL 25 MG PO TABS: ORAL_TABLET | ORAL | Status: DC

## 2012-11-23 MED ORDER — CLONIDINE HCL 0.1 MG PO TABS: 0.1000 mg | ORAL_TABLET | Freq: Two times a day (BID) | ORAL | Status: DC

## 2012-11-23 MED ORDER — TRIAMTERENE-HCTZ 37.5-25 MG PO TABS: 1.0000 | ORAL_TABLET | Freq: Every day | ORAL | Status: DC

## 2012-11-23 MED ORDER — GABAPENTIN 300 MG PO CAPS: 300.0000 mg | ORAL_CAPSULE | Freq: Three times a day (TID) | ORAL | Status: DC

## 2012-11-23 MED ORDER — HYDROCODONE-ACETAMINOPHEN 5-325 MG PO TABS: ORAL_TABLET | ORAL | Status: DC

## 2012-11-23 NOTE — Progress Notes (Signed)
Subjective:    Patient ID: Casey [name], female    DOB: Sep 08, 1935, 77 y.o.   MRN: [id number]  HPI This chart was scribed for Viviann Spare Daub-MD, by Ladona Ridgel Aurelie Dicenzo, Scribe. This patient was seen in room 10 and the patient's care was started at 11:08 AM.  HPI Comments: Casey [name] is a 77 y.o. female who presents to the [facility] w/hx of knee problems complaining of right knee pain, gradually worsened without recent any acute injury to her knee. She reports has had cortisone injections which did not seem to improve her knee pain and previous knee X-rays with arhtritis noted to her bilateral knees.  She also states that she would like to have blood work performed today b/c her blood sugar level was 140.  Past Medical History  Diagnosis Date  . HYPERTENSION, BENIGN ESSENTIAL 03/03/2007  . HYPERCHOLESTEROLEMIA   . SPRAIN&STRAIN OTH SPEC SITES SHOULDER&UPPER ARM   . SINUSITIS, RECURRENT   . Problems with swallowing and mastication   . OSTEOPOROSIS   . OSTEOARTHRITIS, KNEE   . OBESITY   . Microscopic hematuria   . LEG EDEMA, BILATERAL   . HYPERGLYCEMIA   . HIP PAIN, LEFT   . Headache(784.0)   . FLANK PAIN, LEFT   . FATIGUE   . ESOPHAGEAL STRICTURE   . CYSTOCELE WITH INCOMPLETE UTERINE PROLAPSE   . CONSTIPATION   . BENIGN POSITIONAL VERTIGO   . ARM PAIN, RIGHT   . ANXIETY   . ALLERGIC RHINITIS   . Carcinoma in situ of colon   . Peripheral neuropathy, idiopathic     No past surgical history on file.  Family History  Problem Relation Age of Onset  . Colon cancer    . Diabetes    . Coronary artery disease Mother 55  . Cancer Father 72  . Heart disease Father   . Coronary artery disease Sister   . Diabetes type II Sister   . Cancer Sister   . Coronary artery disease Brother   . Kidney disease Brother   . Coronary artery disease Brother     History   Social History  . Marital Status: Single    Spouse Name: N/A    Number of Children: N/A  . Years of  Education: N/A   Occupational History  . Not on file.   Social History Main Topics  . Smoking status: Never Smoker   . Smokeless tobacco: Never Used  . Alcohol Use: No  . Drug Use: No  . Sexual Activity: Not Currently   Other Topics Concern  . Not on file   Social History Narrative  . No narrative on file    No Known Allergies  Patient Active Problem List   Diagnosis Date Noted  . Type II or unspecified type diabetes mellitus without mention of complication, not stated as uncontrolled 05/26/2012  . Vitamin D deficiency disease 10/23/2011  . Anemia 10/23/2011  . Encounter for long-term (current) use of other medications 10/23/2011  . Diabetes mellitus 10/23/2011  . Hyponatremia 12/01/2010    Class: Acute  . Chest pain 11/26/2010    Class: Acute  . Long Q-T syndrome 11/26/2010  . CYSTOCELE WITH INCOMPLETE UTERINE PROLAPSE 03/10/2010  . HYPERGLYCEMIA 02/06/2010  . ESOPHAGEAL STRICTURE 12/11/2009  . OBESITY 12/09/2009  . PROBLEMS WITH SWALLOWING AND MASTICATION 12/09/2009  . MICROSCOPIC HEMATURIA 07/29/2009  . ARM PAIN, RIGHT 03/28/2009  . FATIGUE 03/28/2009  . CONSTIPATION 09/25/2008  . SINUSITIS, RECURRENT 02/23/2008  .  OSTEOARTHRITIS, KNEE 09/28/2007  . LEG EDEMA, BILATERAL 08/31/2007  . FLANK PAIN, LEFT 06/08/2007  . SPRAIN&STRAIN OTH SPEC SITES SHOULDER&UPPER ARM 06/08/2007  . BENIGN POSITIONAL VERTIGO 04/25/2007  . HEADACHE 03/24/2007  . HYPERCHOLESTEROLEMIA 03/03/2007  . ANXIETY 03/03/2007  . HYPERTENSION, BENIGN ESSENTIAL 03/03/2007  . ALLERGIC RHINITIS 03/03/2007  . HIP PAIN, LEFT 03/03/2007  . OSTEOPOROSIS 03/03/2007    Results for orders placed in visit on 11/23/12  POCT CBC      Result Value Range   WBC 8.1  4.6 - 10.2 K/uL   Lymph, poc 3.1  0.6 - 3.4   POC LYMPH PERCENT 38.5  10 - 50 %L   MID (cbc) 0.7  0 - 0.9   POC MID % 8.2  0 - 12 %M   POC Granulocyte 4.3  2 - 6.9   Granulocyte percent 53.3  37 - 80 %G   RBC 4.62  4.04 - 5.48 M/uL    Hemoglobin 12.8  12.2 - 16.2 g/dL   HCT, POC 16.1  09.6 - 47.9 %   MCV 86.1  80 - 97 fL   MCH, POC 27.7  27 - 31.2 pg   MCHC 32.2  31.8 - 35.4 g/dL   RDW, POC 04.5     Platelet Count, POC 388  142 - 424 K/uL   MPV 7.7  0 - 99.8 fL  GLUCOSE, POCT (MANUAL RESULT ENTRY)      Result Value Range   POC Glucose 130 (*) 70 - 99 mg/dl  POCT GLYCOSYLATED HEMOGLOBIN (HGB A1C)      Result Value Range   Hemoglobin A1C 5.8      1. Essential hypertension, benign   2. Hyposmolality and/or hyponatremia   3. Type I (juvenile type) diabetes mellitus with peripheral circulatory disorders, not stated as uncontrolled(250.71)   4. Knee pain, chronic, right     No orders of the defined types were placed in this encounter.     Review of Systems  Musculoskeletal:       Right knee pain.    Triage Vitals: BP 167/92  Pulse 78  Temp(Src) 98.4 F (36.9 C) (Oral)  Resp 18  Wt 189 lb (85.73 kg)  SpO2 99%    Objective:   Physical Exam  Nursing note and vitals reviewed. Constitutional: She is oriented to person, place, and time. She appears well-developed and well-nourished. No distress.  HENT:  Head: Normocephalic and atraumatic.  Neck: Neck supple. No tracheal deviation present.  Cardiovascular: Normal rate, regular rhythm and normal heart sounds.   No murmur heard. Pulmonary/Chest: Effort normal and breath sounds normal. No respiratory distress. She has no wheezes. She has no rales.  Musculoskeletal: Normal range of motion.  Severe degenerative changes of both knees without locking or effusion.  Neurological: She is alert and oriented to person, place, and time.  Skin: Skin is warm and dry.  Psychiatric: She has a normal mood and affect. Her behavior is normal.   Results for orders placed in visit on 11/23/12  POCT CBC      Result Value Range   WBC 8.1  4.6 - 10.2 K/uL   Lymph, poc 3.1  0.6 - 3.4   POC LYMPH PERCENT 38.5  10 - 50 %L   MID (cbc) 0.7  0 - 0.9   POC MID % 8.2  0 - 12 %M    POC Granulocyte 4.3  2 - 6.9   Granulocyte percent 53.3  37 - 80 %G  RBC 4.62  4.04 - 5.48 M/uL   Hemoglobin 12.8  12.2 - 16.2 g/dL   HCT, POC 82.9  56.2 - 47.9 %   MCV 86.1  80 - 97 fL   MCH, POC 27.7  27 - 31.2 pg   MCHC 32.2  31.8 - 35.4 g/dL   RDW, POC 13.0     Platelet Count, POC 388  142 - 424 K/uL   MPV 7.7  0 - 99.8 fL  GLUCOSE, POCT (MANUAL RESULT ENTRY)      Result Value Range   POC Glucose 130 (*) 70 - 99 mg/dl  POCT GLYCOSYLATED HEMOGLOBIN (HGB A1C)      Result Value Range   Hemoglobin A1C 5.8           Assessment & Plan:

## 2012-11-25 ENCOUNTER — Encounter: Payer: Self-pay | Admitting: Family Medicine

## 2012-11-25 ENCOUNTER — Telehealth: Payer: Self-pay | Admitting: *Deleted

## 2012-11-25 ENCOUNTER — Ambulatory Visit (INDEPENDENT_AMBULATORY_CARE_PROVIDER_SITE_OTHER): Payer: Medicare Other | Admitting: Family Medicine

## 2012-11-25 VITALS — BP 174/98 | HR 87 | Temp 98.1°F | Resp 16 | Ht 63.0 in | Wt 191.4 lb

## 2012-11-25 DIAGNOSIS — M1710 Unilateral primary osteoarthritis, unspecified knee: Secondary | ICD-10-CM

## 2012-11-25 DIAGNOSIS — G4700 Insomnia, unspecified: Secondary | ICD-10-CM

## 2012-11-25 DIAGNOSIS — G8929 Other chronic pain: Secondary | ICD-10-CM

## 2012-11-25 DIAGNOSIS — I10 Essential (primary) hypertension: Secondary | ICD-10-CM

## 2012-11-25 DIAGNOSIS — M25569 Pain in unspecified knee: Secondary | ICD-10-CM

## 2012-11-25 DIAGNOSIS — IMO0002 Reserved for concepts with insufficient information to code with codable children: Secondary | ICD-10-CM

## 2012-11-25 DIAGNOSIS — F411 Generalized anxiety disorder: Secondary | ICD-10-CM

## 2012-11-25 MED ORDER — ESCITALOPRAM OXALATE 10 MG PO TABS: 10.0000 mg | ORAL_TABLET | Freq: Every day | ORAL | Status: DC

## 2012-11-25 MED ORDER — CLONIDINE HCL 0.1 MG PO TABS: ORAL_TABLET | ORAL | Status: DC

## 2012-11-25 MED ORDER — HYDROCODONE-ACETAMINOPHEN 5-325 MG PO TABS: ORAL_TABLET | ORAL | Status: DC

## 2012-11-25 MED ORDER — ALPRAZOLAM 1 MG PO TABS: ORAL_TABLET | ORAL | Status: DC

## 2012-11-25 NOTE — Telephone Encounter (Signed)
Faxed signed forms (transportation) to [employer] medicaid enrolled provider and patient was given copies for her records.

## 2012-11-25 NOTE — Patient Instructions (Addendum)
We want your blood sugar to be less than 120 in the morning.  The protein in the urine - showing that your kidneys aren't functioning as well as they used to - is not due to your diabetes - which seems to be doing pretty well, but it is do to your blood pressure.  Your blood pressure goes up and down really easily so we need you to start checking it at home so we can tell what it is doing most of the time and if your medicines need to be adjusted.  You may be right that not sleeping has made your blood pressure go up. Today we doubled your alprazolam and your escitalopram which will hopefully help with your anxiety and help you sleep at night. Also, try taking 2 tabs of your clonidine 0.1mg  - a blood pressure medicine that also makes people feel tired - and continue taking 1 tab in the morning.  You were given 90 tabs of your pain medicine by Dr. Cleta Alberts - I want this to last your for at least 2 months so only take 1 a day but on some days when you are hurting  A lot, you may take a second dose.  You have a second prescription from me that you can fill in 2 months from now.

## 2012-11-25 NOTE — Progress Notes (Signed)
Subjective:    Patient ID: Casey [name], female    DOB: 12/05/1935, 77 y.o.   MRN: [id number] Chief Complaint  Patient presents with  . Follow-up    kidney recheck    HPI  Here today because her microalbumin was elevated a lot on recent labs and was normal last yr so she is concerned about her kidneys. Was told that her DM was out of control so wondering if she needs medication so her kidneys don't get worse.  Hgba1c this summer was up to 7.0- but pt given instructions on diet and now recent hgba1c 2d prev is down to 5.8!  Pt does check cbgs at home qd - always <140.  Has never been on DM meds prior.   BP labile, does not check BP outside of office as does not have easy access.  Right knee has been bothering her a lot - past injections did help a little - not a lot but enough to where she would like to continue them - the last one seemed to work much better on her left knee so hasn't had as much pain in that one recently. Uses vicodin prn - <1 tab/d - requests refill.  Still has a lot of trouble sleeping at night - always has.  Past Medical History  Diagnosis Date  . HYPERTENSION, BENIGN ESSENTIAL 03/03/2007  . HYPERCHOLESTEROLEMIA   . SPRAIN&STRAIN OTH SPEC SITES SHOULDER&UPPER ARM   . SINUSITIS, RECURRENT   . Problems with swallowing and mastication   . OSTEOPOROSIS   . OSTEOARTHRITIS, KNEE   . OBESITY   . Microscopic hematuria   . LEG EDEMA, BILATERAL   . HYPERGLYCEMIA   . HIP PAIN, LEFT   . Headache(784.0)   . FLANK PAIN, LEFT   . FATIGUE   . ESOPHAGEAL STRICTURE   . CYSTOCELE WITH INCOMPLETE UTERINE PROLAPSE   . CONSTIPATION   . BENIGN POSITIONAL VERTIGO   . ARM PAIN, RIGHT   . ANXIETY   . ALLERGIC RHINITIS   . Carcinoma in situ of colon   . Peripheral neuropathy, idiopathic    Current Outpatient Prescriptions on File Prior to Visit  Medication Sig Dispense Refill  . atorvastatin (LIPITOR) 40 MG tablet Take 1 tablet (40 mg total) by mouth every evening.  90 tablet   0  . azelastine (ASTELIN) 137 MCG/SPRAY nasal spray Place 1 spray into the nose 2 (two) times daily as needed for rhinitis. Use in each nostril as directed  30 mL  11  . cholecalciferol (VITAMIN D) 1000 UNITS tablet Take 1,000 Units by mouth daily.      . cyanocobalamin 500 MCG tablet Take 500 mcg by mouth daily.       . fluticasone (FLONASE) 50 MCG/ACT nasal spray Place 2 sprays into the nose daily.  16 g  5  . furosemide (LASIX) 20 MG tablet Take 1 tablet (20 mg total) by mouth daily.  30 tablet  3  . gabapentin (NEURONTIN) 300 MG capsule Take 1 capsule (300 mg total) by mouth 3 (three) times daily.  90 capsule  5  . ipratropium (ATROVENT) 0.06 % nasal spray Place 2 sprays into the nose 4 (four) times daily as needed for rhinitis.  15 mL  5  . meclizine (ANTIVERT) 25 MG tablet Take one tablet up to 3 times a day for dizziness  30 tablet  2  . omeprazole (PRILOSEC) 20 MG capsule Take 1 capsule (20 mg total) by mouth daily.  30 capsule  3  . potassium chloride SA (K-DUR,KLOR-CON) 20 MEQ tablet Take 1 tablet (20 mEq total) by mouth daily.  30 tablet  3  . triamterene-hydrochlorothiazide (MAXZIDE-25) 37.5-25 MG per tablet Take 1 tablet by mouth daily.  30 tablet  11  . Blood Glucose Monitoring Suppl (BLOOD GLUCOSE METER) kit NEED REFILLS     Use as instructed       No current facility-administered medications on file prior to visit.   No Known Allergies   Review of Systems  Constitutional: Negative for fever, chills, activity change, fatigue and unexpected weight change.  Respiratory: Negative for cough and shortness of breath.   Cardiovascular: Negative for chest pain and palpitations.  Musculoskeletal: Positive for arthralgias, gait problem, joint swelling and myalgias.  Skin: Negative for color change and rash.  Neurological: Negative for weakness and numbness.  Psychiatric/Behavioral: Positive for sleep disturbance. Negative for behavioral problems, dysphoric mood and agitation. The  patient is nervous/anxious.       BP 174/98  Pulse 87  Temp(Src) 98.1 F (36.7 C) (Oral)  Resp 16  Ht 5\' 3"  (1.6 m)  Wt 191 lb 6.4 oz (86.818 kg)  BMI 33.91 kg/m2  SpO2 98% Objective:   Physical Exam  Constitutional: She is oriented to person, place, and time. She appears well-developed and well-nourished. No distress.  HENT:  Head: Normocephalic and atraumatic.  Right Ear: External ear normal.  Left Ear: External ear normal.  Eyes: Conjunctivae are normal. No scleral icterus.  Neck: Normal range of motion. Neck supple. No thyromegaly present.  Cardiovascular: Normal rate, regular rhythm, normal heart sounds and intact distal pulses.   Pulmonary/Chest: Effort normal and breath sounds normal. No respiratory distress.  Musculoskeletal: She exhibits no edema.       Right knee: She exhibits effusion. She exhibits normal range of motion. Tenderness found. Medial joint line, lateral joint line and patellar tendon tenderness noted.       Left knee: She exhibits effusion. She exhibits normal range of motion. Tenderness found. Medial joint line, lateral joint line and patellar tendon tenderness noted.  Lymphadenopathy:    She has no cervical adenopathy.  Neurological: She is alert and oriented to person, place, and time.  Skin: Skin is warm and dry. She is not diaphoretic. No erythema.  Psychiatric: She has a normal mood and affect. Her behavior is normal.         Risks/benefits of aspiration and injection reviewed and informed consent obtained.  Right knee positioned at 10 deg flexion, cleaned with alcohol x 2 and betadine x 2.  Anesthesia w/ ethyl chloride cold spray.  Injected with 40mg  of DepoMedrol and 5cc of 1% lidocaine using 21g 1 1/2in needle in superior lateral approach to suprapatellar bursa without complications. Pt tolerated procedure well. No EBL. Assessment & Plan:   ANXIETY - Plan: escitalopram (LEXAPRO) 10 MG tablet - doubled lexapro and increased qhs  xanax  HYPERTENSION, BENIGN ESSENTIAL - Plan: cloNIDine (CATAPRES) 0.1 MG tablet - likely cause of microalbuminuria - buy home BP cuff, increase dose of qhs clonidine.  Due to age and fall risk due to knee problems, do not want to overtreat - goal higher than nml. Recheck in 2 months.   Knee pain due to osteoarthritis, chronic, right - Plan: HYDROcodone-acetaminophen (NORCO/VICODIN) 5-325 MG per tablet - right knee joint injectiondone today.  Pt repeatedly informed that though hydrocodone rx says she can take it q8 hrs she she only use it when pain is severe - I expect 90  pills to last her for 2 mos - only to take on avg 1-2 tabs/d. Received rx from Dr. Cleta Alberts 2d ago and rx from me today so she should not need any additional refills until March when she is due for an OV.   Insomnia - increased xanax and hopefully doubling clonidine at night will help as well.   DMII - diet controlled - has never been on medication - highest a1c was this summer at 7 but at last check 2d prev was down to 5.8 - doing great on diabetic diet.  Meds ordered this encounter  Medications  . escitalopram (LEXAPRO) 10 MG tablet    Sig: Take 1 tablet (10 mg total) by mouth daily.    Dispense:  30 tablet    Refill:  5  . ALPRAZolam (XANAX) 1 MG tablet    Sig: take 1 tablet by mouth at bedtime if needed for sleep or anxiety    Dispense:  30 tablet    Refill:  5  . HYDROcodone-acetaminophen (NORCO/VICODIN) 5-325 MG per tablet    Sig: Take one tablet every 8 hours as needed for severe pain. May fill on or after 01/24/12.    Dispense:  90 tablet    Refill:  0    Needs OV before any further refills.  . cloNIDine (CATAPRES) 0.1 MG tablet    Sig: Take 2 tabs po qhs and 1 tab po qam    Dispense:  90 tablet    Refill:  3   [name], MD MPH

## 2012-12-20 ENCOUNTER — Other Ambulatory Visit: Payer: Self-pay | Admitting: Family Medicine

## 2013-01-20 ENCOUNTER — Telehealth: Payer: Self-pay

## 2013-01-20 NOTE — Telephone Encounter (Signed)
Form faxed for Medicare coverage of DM supplies. Completed and put in PA box for signature.

## 2013-01-26 NOTE — Telephone Encounter (Signed)
faxed

## 2013-02-03 ENCOUNTER — Ambulatory Visit (INDEPENDENT_AMBULATORY_CARE_PROVIDER_SITE_OTHER): Payer: Medicare Other | Admitting: Family Medicine

## 2013-02-03 VITALS — BP 145/75 | HR 89 | Temp 97.4°F | Resp 16 | Ht 63.0 in | Wt 194.0 lb

## 2013-02-03 DIAGNOSIS — E7800 Pure hypercholesterolemia, unspecified: Secondary | ICD-10-CM

## 2013-02-03 DIAGNOSIS — R609 Edema, unspecified: Secondary | ICD-10-CM

## 2013-02-03 DIAGNOSIS — L0291 Cutaneous abscess, unspecified: Secondary | ICD-10-CM

## 2013-02-03 DIAGNOSIS — M1710 Unilateral primary osteoarthritis, unspecified knee: Secondary | ICD-10-CM

## 2013-02-03 DIAGNOSIS — M25559 Pain in unspecified hip: Secondary | ICD-10-CM

## 2013-02-03 DIAGNOSIS — L0390 Cellulitis, unspecified: Secondary | ICD-10-CM

## 2013-02-03 DIAGNOSIS — I10 Essential (primary) hypertension: Secondary | ICD-10-CM

## 2013-02-03 DIAGNOSIS — IMO0002 Reserved for concepts with insufficient information to code with codable children: Secondary | ICD-10-CM

## 2013-02-03 LAB — COMPLETE METABOLIC PANEL WITH GFR
ALK PHOS: 101 U/L (ref 39–117)
ALT: 14 U/L (ref 0–35)
AST: 17 U/L (ref 0–37)
Albumin: 4 g/dL (ref 3.5–5.2)
BILIRUBIN TOTAL: 0.5 mg/dL (ref 0.3–1.2)
BUN: 8 mg/dL (ref 6–23)
CO2: 27 mEq/L (ref 19–32)
Calcium: 9.8 mg/dL (ref 8.4–10.5)
Chloride: 95 mEq/L — ABNORMAL LOW (ref 96–112)
Creat: 0.74 mg/dL (ref 0.50–1.10)
GFR, EST NON AFRICAN AMERICAN: 80 mL/min
GFR, Est African American: >89 mL/min
GLUCOSE: 115 mg/dL — AB (ref 70–99)
Potassium: 3.4 mEq/L — ABNORMAL LOW (ref 3.5–5.3)
SODIUM: 132 meq/L — AB (ref 135–145)
TOTAL PROTEIN: 6.9 g/dL (ref 6.0–8.3)

## 2013-02-03 LAB — POCT URINALYSIS DIPSTICK
Bilirubin, UA: NEGATIVE
GLUCOSE UA: NEGATIVE
Ketones, UA: NEGATIVE
NITRITE UA: NEGATIVE
PROTEIN UA: NEGATIVE
SPEC GRAV UA: 1.015
UROBILINOGEN UA: 0.2
pH, UA: 8.5

## 2013-02-03 LAB — POCT UA - MICROSCOPIC ONLY
CASTS, UR, LPF, POC: NEGATIVE
CRYSTALS, UR, HPF, POC: NEGATIVE
YEAST UA: NEGATIVE

## 2013-02-03 MED ORDER — HYDROCODONE-ACETAMINOPHEN 5-325 MG PO TABS: ORAL_TABLET | ORAL | Status: DC

## 2013-02-03 MED ORDER — POTASSIUM CHLORIDE CRYS ER 20 MEQ PO TBCR: 20.0000 meq | EXTENDED_RELEASE_TABLET | Freq: Every day | ORAL | Status: DC

## 2013-02-03 MED ORDER — ATORVASTATIN CALCIUM 40 MG PO TABS: 40.0000 mg | ORAL_TABLET | Freq: Every evening | ORAL | Status: DC

## 2013-02-03 MED ORDER — OMEPRAZOLE 20 MG PO CPDR: 20.0000 mg | DELAYED_RELEASE_CAPSULE | Freq: Every day | ORAL | Status: DC

## 2013-02-03 MED ORDER — MECLIZINE HCL 25 MG PO TABS: ORAL_TABLET | ORAL | Status: DC

## 2013-02-03 MED ORDER — DOXYCYCLINE HYCLATE 100 MG PO CAPS: 100.0000 mg | ORAL_CAPSULE | Freq: Two times a day (BID) | ORAL | Status: DC

## 2013-02-03 NOTE — Patient Instructions (Signed)
We stopped your lasix.  Look around your home and try to figure out if you are taking your clonidine. Bring ALL your blood pressure pills into your next visit.  We will want to see you back in March - you will have to make an appointment with a different doctor at that time - like Dr. Conley RollsLe or Dr. Cleta Albertsaub.  I will see you back in May.  If your groin boil gets worse at all, come back immediately to the urgent care clinic at [facility].

## 2013-02-03 NOTE — Progress Notes (Addendum)
Subjective:    Patient ID: Casey [name], female    DOB: May 27, 1935, 78 y.o.   MRN: [id number] Chief Complaint  Patient presents with  . Follow-up    HTN  . Knee Pain    bilateral, pt requesting shots  . Medication Refill    HPI This chart was scribed for Esmond Camper, by Ladona Ridgel Day, Scribe. This patient was seen in room 24 and the patient's care was started at 10:11 AM.  HPI Comments: Casey [name] is a 78 y.o. female who presents to the [facility] for follow up of her knee pain, hypertension and medication refill.  Requests repeat cortisone inj in her knees - she has been receiving these every 3 mos with minimal improvement in her knee pain. However, last intraarticular knee inj done 2 mos prior so to soon to repeat today.  She is also taking hydrocodone for her knee pain - was on #30/mo but with recent changes in the prescribing guidelines, her use as unintentionally increased to about #60/mo. She would like a refill on her pain medicine and would like it to be stronger.  Today she is also c/o left hip pain - apparently this is a chronic issue as it is on her problem list from yrs prior but she has not been c/o hip pain at our last sev visits.  She states it is so severe it makes her miserable and she doesn't know how she can live with it.    She has not been checking her BP outside of the office. When asked whether or not she has been taking Clonidine as prescribed for her BP, she is unable to respond. She did not bring her BP pills w/her today (She has a green colored pill w/her which she thinks is one of her BP medicines). She has 2 empty bottles of maxzide and an empty bottle of lasix. She does not have any bottles of clonidine. She reports recent incidence of dizziness and unsure if this might be a side effect of her medicine.  She does have chronic recurrent vertigo for which she takes prn meclzine and states her dizziness in unchanged - just worse.  She takes 1  mg Xanax before going to bed to help her sleep. Still not sleeping well.  Was very confused because used to be on 5mg  of lexapro and now she thinks the bottle said 1 and there pharmacist told her that some of the medicines might be to strong for her due to her age.  Pt's lexapro bottle says 10mg  - which is what I increased her to at last visit due to c/o depression, anxiety, and poor sleep. However, there is only 1/2 tab left in the bottle so unsure if pt has been taking 1 whole or a 1/2.  Pt unable to tell me.  Has a tender lump in her groin.  She cannot see it, has been doing epsom salt soaks but getting worse.  Rides [facility] bus here - they need 1 wk advance notice for all medical appointments.   Patient Active Problem List   Diagnosis Date Noted  . Type II or unspecified type diabetes mellitus without mention of complication, not stated as uncontrolled 05/26/2012  . Vitamin D deficiency disease 10/23/2011  . Anemia 10/23/2011  . Encounter for long-term (current) use of other medications 10/23/2011  . Diabetes mellitus 10/23/2011  . Hyponatremia 12/01/2010    Class: Acute  . Chest pain 11/26/2010  Class: Acute  . Long Q-T syndrome 11/26/2010  . CYSTOCELE WITH INCOMPLETE UTERINE PROLAPSE 03/10/2010  . HYPERGLYCEMIA 02/06/2010  . ESOPHAGEAL STRICTURE 12/11/2009  . OBESITY 12/09/2009  . PROBLEMS WITH SWALLOWING AND MASTICATION 12/09/2009  . MICROSCOPIC HEMATURIA 07/29/2009  . ARM PAIN, RIGHT 03/28/2009  . FATIGUE 03/28/2009  . CONSTIPATION 09/25/2008  . SINUSITIS, RECURRENT 02/23/2008  . OSTEOARTHRITIS, KNEE 09/28/2007  . LEG EDEMA, BILATERAL 08/31/2007  . FLANK PAIN, LEFT 06/08/2007  . SPRAIN&STRAIN OTH SPEC SITES SHOULDER&UPPER ARM 06/08/2007  . BENIGN POSITIONAL VERTIGO 04/25/2007  . HEADACHE 03/24/2007  . HYPERCHOLESTEROLEMIA 03/03/2007  . ANXIETY 03/03/2007  . HYPERTENSION, BENIGN ESSENTIAL 03/03/2007  . ALLERGIC RHINITIS 03/03/2007  . HIP PAIN, LEFT 03/03/2007  .  OSTEOPOROSIS 03/03/2007    No past surgical history on file.  Family History  Problem Relation Age of Onset  . Colon cancer    . Diabetes    . Coronary artery disease Mother 65  . Cancer Father 76  . Heart disease Father   . Coronary artery disease Sister   . Diabetes type II Sister   . Cancer Sister   . Coronary artery disease Brother   . Kidney disease Brother   . Coronary artery disease Brother    History   Social History  . Marital Status: Single    Spouse Name: N/A    Number of Children: N/A  . Years of Education: N/A   Occupational History  . Not on file.   Social History Main Topics  . Smoking status: Never Smoker   . Smokeless tobacco: Never Used  . Alcohol Use: No  . Drug Use: No  . Sexual Activity: Not Currently   Other Topics Concern  . Not on file   Social History Narrative  . No narrative on file   No Known Allergies   Review of Systems  Constitutional: Negative for fever, chills, activity change and appetite change.  Respiratory: Negative for cough and shortness of breath.   Cardiovascular: Positive for leg swelling. Negative for chest pain.  Gastrointestinal: Negative for abdominal pain.  Musculoskeletal: Positive for arthralgias, back pain, gait problem, joint swelling and myalgias.       Left hip pain Right knee pain  Neurological: Positive for dizziness, weakness and light-headedness. Negative for syncope.  Psychiatric/Behavioral: Positive for confusion and sleep disturbance.      Objective:   Physical Exam  Nursing note and vitals reviewed. Constitutional: She is oriented to person, place, and time. She appears well-developed and well-nourished. No distress.  HENT:  Head: Normocephalic and atraumatic.  Eyes: Conjunctivae are normal. Right eye exhibits no discharge. Left eye exhibits no discharge.  Neck: Normal range of motion.  Cardiovascular: Normal rate, regular rhythm and normal heart sounds.   No murmur heard. Pulmonary/Chest:  Effort normal and breath sounds normal. No respiratory distress. She has no wheezes. She has no rales.  Genitourinary:     Mobile tender mildly erythematous well defined 1 cm mass in right inguinal region, fluctuant  Musculoskeletal: Normal range of motion. She exhibits no edema.  Neurological: She is alert and oriented to person, place, and time.  Skin: Skin is warm and dry. Lesion noted.  Psychiatric: She has a normal mood and affect. Thought content normal.   Triage Vitals: BP 145/75  Pulse 89  Temp(Src) 97.4 F (36.3 C)  Resp 16  Ht 5\' 3"  (1.6 m)  Wt 194 lb (87.998 kg)  BMI 34.37 kg/m2  SpO2 99%    Assessment & Plan:  HYPERTENSION, BENIGN ESSENTIAL - Plan: potassium chloride SA (K-DUR,KLOR-CON) 20 MEQ tablet, POCT UA - Microscopic Only, POCT urinalysis dipstick, COMPLETE METABOLIC PANEL WITH GFR - much improved today - at last visit we increased clonidine to 2 tabs at night and cont 1 tab in the morning in hopes that taking 2 at night would help w/ sleep and BP. BP improved but pt does not have clonidine w/ her and is unable to tell me if she is taking it - does not remember this med or taking 2 of anything at night.  States she does fill her home med box weekly and then she has her "blood pressure pill that I carry around with me in case my blood pressure goes up like when I am at a funeral." then she showed me some green pills - which I think is her maxzide by pill pictures.  OSTEOARTHRITIS, KNEE - no corticosteroid injections today as done <2 mos prev.  Refilled #90 vicodin - needs f/u in 2 mos for any additional refills. Do NOT call in/send in refills w/o OV as difficult to assess medication usage due to pt's age, etc  LEG EDEMA, BILATERAL - stable, unsure pt is taking her lasix 20 reg so will d/c. K has also been on the low side so will cont 20 qd - check bmp at f/u, still on maxzide  HIP PAIN, LEFT - chronic but has had recent flair of increased pain - not evaluated today, rtc  if cont to have pain  Abscess - Plan: Wound culture - poss inflamed seb cyst - I&D today by Valarie Cones, rec warm compresses and epsom salts bath  HYPERCHOLESTEROLEMIA - Plan: atorvastatin (LIPITOR) 40 MG tablet  DM - well controlled, last hgba1c 5.8  Dizziness - states she has chronic vertigo for which she takes prn meclizine - states it is getting worse - however, she also seems to be using more norco and we are not sure how she is taking her BP meds which could be exacerbating.  Dementia/debility/medication non-compliance - Very difficult to assess how freq pt is taking her pills.  Could benefit from home health nurse to monitor and help w/ pill box fills.  Will go ahead and place referral but did not think of during visit so did not discuss w/ pt at time of visit.  Could  Consider [facility] referral as she has straight medicare but may not take her since she has not been hospitalized freq.  Mood d/o, sleep d/o - pt on xanax for sleep - has multiple refills on bottle so will NOT need refill for at least 2 mos and prob longer - reassess at next OV in 2 mos when she brings in her bottles again.  Reviewed again that we did increase lexapro from 5 to 10mg  at our prev visit so she should take a whole tab daily.  Meds ordered this encounter  Medications  . potassium chloride SA (K-DUR,KLOR-CON) 20 MEQ tablet    Sig: Take 1 tablet (20 mEq total) by mouth daily.    Dispense:  30 tablet    Refill:  3  . omeprazole (PRILOSEC) 20 MG capsule    Sig: Take 1 capsule (20 mg total) by mouth daily.    Dispense:  30 capsule    Refill:  3  . atorvastatin (LIPITOR) 40 MG tablet    Sig: Take 1 tablet (40 mg total) by mouth every evening.    Dispense:  90 tablet    Refill:  0  .  meclizine (ANTIVERT) 25 MG tablet    Sig: Take one tablet up to 3 times a day for dizziness    Dispense:  30 tablet    Refill:  2  . HYDROcodone-acetaminophen (NORCO/VICODIN) 5-325 MG per tablet    Sig: Take one tablet every 8 hours as  needed for severe pain. May fill on or after 02/23/13.    Dispense:  90 tablet    Refill:  0    Needs OV before any further refills.  . doxycycline (VIBRAMYCIN) 100 MG capsule    Sig: Take 1 capsule (100 mg total) by mouth 2 (two) times daily.    Dispense:  20 capsule    Refill:  0    I personally performed the services described in this documentation, which was scribed in my presence. The recorded information has been reviewed and considered, and addended by me as needed.  Norberto SorensonEva Shaw, MD MPH

## 2013-02-04 ENCOUNTER — Encounter: Payer: Self-pay | Admitting: Family Medicine

## 2013-02-04 ENCOUNTER — Other Ambulatory Visit: Payer: Self-pay | Admitting: Family Medicine

## 2013-02-05 LAB — WOUND CULTURE

## 2013-02-28 ENCOUNTER — Telehealth: Payer: Self-pay

## 2013-02-28 NOTE — Telephone Encounter (Signed)
[facility] faxed Medicare form for DM supplies. Completed and put in PA box for signature.

## 2013-04-03 ENCOUNTER — Other Ambulatory Visit: Payer: Self-pay | Admitting: Family Medicine

## 2013-04-04 ENCOUNTER — Ambulatory Visit (INDEPENDENT_AMBULATORY_CARE_PROVIDER_SITE_OTHER): Payer: Medicare Other | Admitting: Emergency Medicine

## 2013-04-04 ENCOUNTER — Encounter: Payer: Self-pay | Admitting: Emergency Medicine

## 2013-04-04 VITALS — BP 140/88 | HR 80 | Temp 98.6°F | Resp 16 | Ht 63.0 in | Wt 192.8 lb

## 2013-04-04 DIAGNOSIS — M129 Arthropathy, unspecified: Secondary | ICD-10-CM

## 2013-04-04 DIAGNOSIS — E119 Type 2 diabetes mellitus without complications: Secondary | ICD-10-CM

## 2013-04-04 DIAGNOSIS — L609 Nail disorder, unspecified: Secondary | ICD-10-CM

## 2013-04-04 DIAGNOSIS — M1990 Unspecified osteoarthritis, unspecified site: Secondary | ICD-10-CM

## 2013-04-04 DIAGNOSIS — I10 Essential (primary) hypertension: Secondary | ICD-10-CM

## 2013-04-04 LAB — BASIC METABOLIC PANEL
BUN: 9 mg/dL (ref 6–23)
CHLORIDE: 95 meq/L — AB (ref 96–112)
CO2: 30 mEq/L (ref 19–32)
Calcium: 10 mg/dL (ref 8.4–10.5)
Creat: 0.77 mg/dL (ref 0.50–1.10)
GLUCOSE: 111 mg/dL — AB (ref 70–99)
POTASSIUM: 3.9 meq/L (ref 3.5–5.3)
Sodium: 134 mEq/L — ABNORMAL LOW (ref 135–145)

## 2013-04-04 LAB — POCT GLYCOSYLATED HEMOGLOBIN (HGB A1C): Hemoglobin A1C: 6.4

## 2013-04-04 LAB — GLUCOSE, POCT (MANUAL RESULT ENTRY): POC Glucose: 128 mg/dl — AB (ref 70–99)

## 2013-04-04 NOTE — Progress Notes (Addendum)
  This chart was scribed for Collene GobbleSteven A Daub, MD by Joaquin MusicKristina Sanchez-Matthews, ED Scribe. This patient was seen in room Room/bed 22 and the patient's care was started at 12:02 PM. Subjective:    Patient ID: Casey [name], female    DOB: 10-Sep-1935, 78 y.o.   MRN: [id number]  HPI Casey Gaussda B Rentz is a 78 y.o. female who [facility] the [facility] DM F/U apt. Pt states she has not been feeling well due to her arthritis causing pain to several joints. She reports having pain to bilateral knees, hips, and joints. Pt states she is having more pain to her R knee. She states she generally has relief for 2 months before the pain returns. Pt denies being seen by orthopedist. Pt denies having any allergies to medications.  Pt states her glucose levels are WNL and reports monitoring her levels daily on her home meter. Pt denies eating salt in her diet. Pt denies wearing support hose but states she has worn support hose in the past and denies having relief.  Pts PCP is Dr. Norberto SorensonEva Shaw. Pt was under the care of an orthopedist due to having shots x 3 months.  Review of Systems  Neurological: Negative for weakness.   Objective:   Physical Exam CONSTITUTIONAL: Well developed/well nourished HEAD: Normocephalic/atraumatic EYES: EOMI/PERRL ENMT: Mucous membranes moist NECK: supple no meningeal signs SPINE:entire spine nontender CV: S1/S2 noted, no murmurs/rubs/gallops noted. Heart rate normal. LUNGS: Lungs are clear to auscultation bilaterally, no apparent distress ABDOMEN: soft, nontender, no rebound or guarding GU:no cva tenderness NEURO: Pt is awake/alert, moves all extremitiesx4 EXTREMITIES: pulses normal, full ROM. 2+ swelling of lower extremities. Severe degenerative changes of bilateral knees. Dystrophic nail of her L 2nd toe. SKIN: warm, color normal PSYCH: no abnormalities of mood noted Procedure note. The medial left knee was prepped with Betadine x2 with 1 1/2 cc of 2% plain under sterile technique and  using sterile gloves 40 of Kenalog along with 2 cc of 2% plain was injected without difficulty. Triage Vitals:BP 140/88  Pulse 80  Temp(Src) 98.6 F (37 C) (Oral)  Resp 16  Ht 5\' 3"  (1.6 m)  Wt 192 lb 12.8 oz (87.454 kg)  BMI 34.16 kg/m2  SpO2 97% Results for orders placed in visit on 04/04/13  GLUCOSE, POCT (MANUAL RESULT ENTRY)      Result Value Ref Range   POC Glucose 128 (*) 70 - 99 mg/dl  POCT GLYCOSYLATED HEMOGLOBIN (HGB A1C)      Result Value Ref Range   Hemoglobin A1C 6.4      Assessment & Plan:  Check sugar and A1c. Referral made to orthopedics for their evaluation of her severe bilateral knee degenerative joint disease. Her sodium was low last time she had blood work. This was repeated today. She is on a diuretic. Diabetes is at goal no change in medication   I personally performed the services described in this documentation, which was scribed in my presence. The recorded information has been reviewed and is accurate.

## 2013-04-04 NOTE — Progress Notes (Deleted)
   Subjective:    Patient ID: Casey [name], female    DOB: 03/14/1935, 78 y.o.   MRN: [id number]  HPI    Review of Systems     Objective:   Physical Exam        Assessment & Plan:   

## 2013-04-19 ENCOUNTER — Encounter: Payer: Self-pay | Admitting: Podiatry

## 2013-04-19 ENCOUNTER — Ambulatory Visit (INDEPENDENT_AMBULATORY_CARE_PROVIDER_SITE_OTHER): Payer: Medicare Other | Admitting: Podiatry

## 2013-04-19 VITALS — BP 159/80 | HR 77 | Resp 12

## 2013-04-19 DIAGNOSIS — M79609 Pain in unspecified limb: Secondary | ICD-10-CM

## 2013-04-19 DIAGNOSIS — B351 Tinea unguium: Secondary | ICD-10-CM

## 2013-04-19 NOTE — Progress Notes (Signed)
   Subjective:    Patient ID: Casey [name], female    DOB: 11-16-1935, 78 y.o.   MRN: [id number]  HPI PT STATED LT FOOT 2ND TOENAIL IS SORE AND THICK FOR 2 MONTHS. THE TOENAIL IS GETTING WORSE. THE TOENAIL GET AGGRAVATED BY PRESSURE. TRIED TO TRIM IT DOWN.   She is also complaining of general discomfort in all the toenails when walking. She does not recall any recent podiatric care.    Review of Systems  HENT: Positive for sinus pressure and sneezing.   Respiratory: Positive for cough.   Cardiovascular: Positive for leg swelling.  Musculoskeletal: Positive for joint swelling and myalgias.  Neurological: Positive for dizziness.  Psychiatric/Behavioral: The patient is nervous/anxious.   All other systems reviewed and are negative.       Objective:   Physical Exam Orientated x3 female  Vascular: DP and PT pulses 2/4 bilaterally  Neurological: Sensation to 10 g monofilament wire intact 5/5 bilaterally. Vibratory sensation intact bilaterally. Ankle reflexes reactive bilaterally.  Dermatological: Elongated, hypertrophic, discolored toenails x10 with maximum pathology noted in the 1-2 bilaterally  Musculoskeletal: No deformities noted. There is no restriction ankle, subtalar joint midtarsal joints bilaterally        Assessment & Plan:   Assessment: Satisfactory neurovascular status Symptomatic onychomycoses 6-10  Plan: Nails x10 are debrided without any bleeding. Reappoint at three-month intervals. Diabetic footcare information provided and he after visit summary.

## 2013-04-19 NOTE — Patient Instructions (Signed)
Diabetes and Foot Care Diabetes may cause you to have problems because of poor blood supply (circulation) to your feet and legs. This may cause the skin on your feet to become thinner, break easier, and heal more slowly. Your skin may become dry, and the skin may peel and crack. You may also have nerve damage in your legs and feet causing decreased feeling in them. You may not notice minor injuries to your feet that could lead to infections or more serious problems. Taking care of your feet is one of the most important things you can do for yourself.  HOME CARE INSTRUCTIONS  Wear shoes at all times, even in the house. Do not go barefoot. Bare feet are easily injured.  Check your feet daily for blisters, cuts, and redness. If you cannot see the bottom of your feet, use a mirror or ask someone for help.  Wash your feet with warm water (do not use hot water) and mild soap. Then pat your feet and the areas between your toes until they are completely dry. Do not soak your feet as this can dry your skin.  Apply a moisturizing lotion or petroleum jelly (that does not contain alcohol and is unscented) to the skin on your feet and to dry, brittle toenails. Do not apply lotion between your toes.  Trim your toenails straight across. Do not dig under them or around the cuticle. File the edges of your nails with an emery board or nail file.  Do not cut corns or calluses or try to remove them with medicine.  Wear clean socks or stockings every day. Make sure they are not too tight. Do not wear knee-high stockings since they may decrease blood flow to your legs.  Wear shoes that fit properly and have enough cushioning. To break in new shoes, wear them for just a few hours a day. This prevents you from injuring your feet. Always look in your shoes before you put them on to be sure there are no objects inside.  Do not cross your legs. This may decrease the blood flow to your feet.  If you find a minor scrape,  cut, or break in the skin on your feet, keep it and the skin around it clean and dry. These areas may be cleansed with mild soap and water. Do not cleanse the area with peroxide, alcohol, or iodine.  When you remove an adhesive bandage, be sure not to damage the skin around it.  If you have a wound, look at it several times a day to make sure it is healing.  Do not use heating pads or hot water bottles. They may burn your skin. If you have lost feeling in your feet or legs, you may not know it is happening until it is too late.  Make sure your health care provider performs a complete foot exam at least annually or more often if you have foot problems. Report any cuts, sores, or bruises to your health care provider immediately. SEEK MEDICAL CARE IF:   You have an injury that is not healing.  You have cuts or breaks in the skin.  You have an ingrown nail.  You notice redness on your legs or feet.  You feel burning or tingling in your legs or feet.  You have pain or cramps in your legs and feet.  Your legs or feet are numb.  Your feet always feel cold. SEEK IMMEDIATE MEDICAL CARE IF:   There is increasing redness,   swelling, or pain in or around a wound.  There is a red line that goes up your leg.  Pus is coming from a wound.  You develop a fever or as directed by your health care provider.  You notice a bad smell coming from an ulcer or wound. Document Released: 01/03/2000 Document Revised: 09/07/2012 Document Reviewed: 06/14/2012 [employer] Patient Information 2014 [employer].  

## 2013-04-20 ENCOUNTER — Encounter: Payer: Self-pay | Admitting: Podiatry

## 2013-06-05 ENCOUNTER — Telehealth: Payer: Self-pay

## 2013-06-05 NOTE — Telephone Encounter (Signed)
PT STATES SHE IS IN NEED OF HER XANAX 1MG S AND ANTIVERT 25MG S. PLEASE CALL [phone] WHEN READY FOR PICK UP

## 2013-06-06 ENCOUNTER — Ambulatory Visit (INDEPENDENT_AMBULATORY_CARE_PROVIDER_SITE_OTHER): Payer: Medicare Other | Admitting: Family Medicine

## 2013-06-06 ENCOUNTER — Ambulatory Visit: Payer: Medicare Other

## 2013-06-06 VITALS — BP 132/80 | HR 74 | Temp 97.6°F | Resp 18 | Ht 62.0 in | Wt 191.0 lb

## 2013-06-06 DIAGNOSIS — G8929 Other chronic pain: Secondary | ICD-10-CM

## 2013-06-06 DIAGNOSIS — R109 Unspecified abdominal pain: Principal | ICD-10-CM

## 2013-06-06 DIAGNOSIS — E7800 Pure hypercholesterolemia, unspecified: Secondary | ICD-10-CM

## 2013-06-06 DIAGNOSIS — E119 Type 2 diabetes mellitus without complications: Secondary | ICD-10-CM

## 2013-06-06 DIAGNOSIS — I739 Peripheral vascular disease, unspecified: Secondary | ICD-10-CM

## 2013-06-06 DIAGNOSIS — IMO0002 Reserved for concepts with insufficient information to code with codable children: Secondary | ICD-10-CM

## 2013-06-06 DIAGNOSIS — F411 Generalized anxiety disorder: Secondary | ICD-10-CM

## 2013-06-06 DIAGNOSIS — M1710 Unilateral primary osteoarthritis, unspecified knee: Secondary | ICD-10-CM

## 2013-06-06 DIAGNOSIS — R609 Edema, unspecified: Secondary | ICD-10-CM

## 2013-06-06 LAB — POCT URINALYSIS DIPSTICK
Bilirubin, UA: NEGATIVE
Glucose, UA: NEGATIVE
KETONES UA: NEGATIVE
LEUKOCYTES UA: NEGATIVE
NITRITE UA: NEGATIVE
PH UA: 8.5
PROTEIN UA: NEGATIVE
Spec Grav, UA: 1.015
UROBILINOGEN UA: 0.2

## 2013-06-06 LAB — POCT UA - MICROSCOPIC ONLY
Bacteria, U Microscopic: NEGATIVE
CASTS, UR, LPF, POC: NEGATIVE
CRYSTALS, UR, HPF, POC: NEGATIVE
Mucus, UA: NEGATIVE
RBC, urine, microscopic: NEGATIVE
WBC, Ur, HPF, POC: NEGATIVE
YEAST UA: NEGATIVE

## 2013-06-06 MED ORDER — MECLIZINE HCL 25 MG PO TABS: ORAL_TABLET | ORAL | Status: DC

## 2013-06-06 MED ORDER — ATORVASTATIN CALCIUM 40 MG PO TABS: 40.0000 mg | ORAL_TABLET | Freq: Every evening | ORAL | Status: DC

## 2013-06-06 MED ORDER — HYDROCODONE-ACETAMINOPHEN 5-325 MG PO TABS: 1.0000 | ORAL_TABLET | Freq: Every evening | ORAL | Status: DC | PRN

## 2013-06-06 MED ORDER — ALPRAZOLAM 1 MG PO TABS: ORAL_TABLET | ORAL | Status: DC

## 2013-06-06 MED ORDER — ESCITALOPRAM OXALATE 10 MG PO TABS: 10.0000 mg | ORAL_TABLET | Freq: Every day | ORAL | Status: DC

## 2013-06-06 NOTE — Progress Notes (Addendum)
Subjective:  This chart was scribed for Casey Knapp, MD, by Neta Ehlers, ED Scribe. This patient's care was started at 11:18 AM.   Patient ID: Casey [name], female    DOB: 1935-12-27, 78 y.o.   MRN: [id number]  Chief Complaint  Patient presents with  . Leg Swelling    Left lower leg, X couple weeks    HPI  Casey [name] is a 78 y.o. female who presents to [facility] complaining of a lump to her left lower leg which persisted for a week, but then resolved spontaneously yesterday. She reports the lump was approximately the size of her hand and was located on the lateral aspect of her left lower leg. The lump was skin-colored and painful. She denies redness or warmth associated with the lump. The pt denies injuries to the leg. She also denies chest pain, SOB, palpitations, or lightheadedness. She reports aching to her left calf with ambulating, but which improves with rest. She is worried that she had a blood clot.  She endorses lower extremity swelling increased from baseline in the past month. She reports she can no longer wear her church shoes. She denies a sense of "smothering" when recumbent at night; she sleeps on her side with one pillow. The pt wears compression stockings approximately half a day daily. She states wore them more when she worked.  Casey [name] also complains of intermittent left-sided flank pain which radiates to the LLQ. She reports she has been out of arthritis medication for "some time." She denies increased flank pain with deep inspiration or cough. She also denies vaginal or rectal bleeding, changes in stool, problems w/ urination, vaginal pain, or vaginal discharge. Denies change in appetite or early satiety, no abd distension.  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. She also has a h/o cystocele with incomplete uterine prolapse but is unsure what was done for correction or if  she had a hysterectomy; she reports h/o nml annual pap smears. In October 2013, Casey [name] was complaining of left flank pain worsened with sitting. I suspected a muscularskeletal source of flank pain as UA was normal; no work-up was done at that time.   Pt has well-controlled DM by diet x 3 yrs.  Two months prior A1C was 6.4. Prior to that it had been 5.8, and then 7.0. Her bmp was also normal.   Pt received cortisone injections 2 mos prior to R knee to great relief - she reports is helped immensely and wishes it was done on both knees.  However, note states it was done on left knee so not completely sure of laterality so do not feel comfortable administering injection to other knee today.  Pt has been receiving knee cortisone injections regularly every 3-4 mos for almost 2 yrs now. She was prev referred to [facility] re her knee pain; but she declined the initial appointment at that time, but today she expresses the desire to be treated by an orthopedic. Anything that would help her recurrent knee pain. She takes IBU every four hours for the pain.  Casey [name] she denies any recent falls.  Pt requested refill of hydrocodone.   She had an appointment with a podiatrist six weeks ago. Her toenails were cut and she was informed to return in six months.   Casey [name] brought her medications with her today and I reviewed bottles.  It appears that she is taking potassium about half the time; it has one refill left. She is taking meclizine >1x/d on average.  The pt reports she no longer takes clonidine.   Ms [name] was hospitalized in November 2012 for chest pain and saw cardiologist.  She had a normal cardiac function with normal ventricular function on cath. She was recommended to have yearly EKGs due to prolonged QT syndrome and avoid any potentially exacerbating meds.  Her chest pain was attributed to musculoskeletal.   She reports a long-standing h/o vertigo and recent episodes of  dizziness; she is out of meclizine.   Past Medical History  Diagnosis Date  . HYPERTENSION, BENIGN ESSENTIAL 03/03/2007  . HYPERCHOLESTEROLEMIA   . SPRAIN&STRAIN OTH SPEC SITES SHOULDER&UPPER ARM   . SINUSITIS, RECURRENT   . Problems with swallowing and mastication   . OSTEOPOROSIS   . OSTEOARTHRITIS, KNEE   . OBESITY   . Microscopic hematuria   . LEG EDEMA, BILATERAL   . HYPERGLYCEMIA   . HIP PAIN, LEFT   . Headache(784.0)   . FLANK PAIN, LEFT   . FATIGUE   . ESOPHAGEAL STRICTURE   . CYSTOCELE WITH INCOMPLETE UTERINE PROLAPSE   . CONSTIPATION   . BENIGN POSITIONAL VERTIGO   . ARM PAIN, RIGHT   . ANXIETY   . ALLERGIC RHINITIS   . Carcinoma in situ of colon   . Peripheral neuropathy, idiopathic     Current Outpatient Prescriptions on File Prior to Visit  Medication Sig Dispense Refill  . ALPRAZolam (XANAX) 1 MG tablet take 1 tablet by mouth at bedtime if needed for sleep or anxiety  30 tablet  5  . atorvastatin (LIPITOR) 40 MG tablet Take 1 tablet (40 mg total) by mouth every evening.  90 tablet  0  . cholecalciferol (VITAMIN D) 1000 UNITS tablet Take 1,000 Units by mouth daily.      . cloNIDine (CATAPRES) 0.1 MG tablet Take 2 tabs po qhs and 1 tab po qam  90 tablet  3  . cyanocobalamin 500 MCG tablet Take 500 mcg by mouth daily.       Marland Kitchen doxycycline (VIBRAMYCIN) 100 MG capsule Take 1 capsule (100 mg total) by mouth 2 (two) times daily.  20 capsule  0  . escitalopram (LEXAPRO) 10 MG tablet Take 1 tablet (10 mg total) by mouth daily.  30 tablet  5  . gabapentin (NEURONTIN) 300 MG capsule Take 1 capsule (300 mg total) by mouth 3 (three) times daily.  90 capsule  5  . HYDROcodone-acetaminophen (NORCO/VICODIN) 5-325 MG per tablet Take one tablet every 8 hours as needed for severe pain. May fill on or after 02/23/13.  90 tablet  0  . meclizine (ANTIVERT) 25 MG tablet take 1 tablet by mouth UP TO 3 TIMES A DAY FOR DIZZINESS  30 tablet  2  . omeprazole (PRILOSEC) 20 MG capsule Take 1  capsule (20 mg total) by mouth daily.  30 capsule  3  . potassium chloride SA (K-DUR,KLOR-CON) 20 MEQ tablet take 1 tablet by mouth once daily  30 tablet  3  . triamterene-hydrochlorothiazide (MAXZIDE-25) 37.5-25 MG per tablet Take 1 tablet by mouth daily.  30 tablet  11  . Blood Glucose Monitoring Suppl (BLOOD GLUCOSE METER) kit NEED REFILLS     Use as instructed       No current facility-administered medications on file prior to visit.    No Known Allergies  Ms. Yontz uses Medicaid transportation bus for medical  appointments so has to have several d advanced notice.  She lives independently; she has never been married. She reports she has a neighbor across the street who looks after her, and she reports her sister and a friend also check in on her.  Review of Systems  Constitutional: Negative for fever, diaphoresis, activity change, appetite change and unexpected weight change.  Respiratory: Negative for chest tightness and shortness of breath.   Cardiovascular: Positive for leg swelling (Baseline ). Negative for chest pain and palpitations.  Gastrointestinal: Positive for abdominal pain. Negative for nausea, vomiting, diarrhea, constipation, blood in stool and abdominal distention.  Genitourinary: Positive for flank pain and pelvic pain. Negative for hematuria, vaginal bleeding, vaginal discharge, difficulty urinating, genital sores, vaginal pain and menstrual problem.  Musculoskeletal: Positive for arthralgias (Right knee), gait problem, joint swelling and myalgias (Left lower leg).  Skin: Negative for color change and wound.  Neurological: Positive for dizziness and weakness. Negative for syncope, light-headedness and numbness.  Psychiatric/Behavioral: The patient is nervous/anxious.        Objective:   Physical Exam  Nursing note and vitals reviewed. Constitutional: She is oriented to person, place, and time. She appears well-developed and well-nourished. No distress.  HENT:    Head: Normocephalic and atraumatic.  Eyes: EOM are normal.  Neck: Neck supple. No tracheal deviation present.  Cardiovascular: Normal rate, regular rhythm, S1 normal and S2 normal.   Pulmonary/Chest: Effort normal. No respiratory distress.  Abdominal: Soft. Bowel sounds are normal. She exhibits no distension and no mass. There is no hepatosplenomegaly. There is no tenderness.  Large diastatis recti  Musculoskeletal: Normal range of motion. She exhibits edema (1+ non-pitting edema bilaterally).  Pain lateral lower left rib.  No pain with any abdominal or pelvic palpation.  Left lower leg: 10 cm distal to inferior patella edge 38.8 Right lower leg: 38.8   Neurological: She is alert and oriented to person, place, and time.  Skin: Skin is warm and dry.  Psychiatric: She has a normal mood and affect. Her behavior is normal.     Vitals: BP 132/80  Pulse 74  Temp(Src) 97.6 F (36.4 C) (Oral)  Resp 18  Ht 5' 2" (1.575 m)  Wt 191 lb (86.637 kg)  BMI 34.93 kg/m2  SpO2 97%  [facility] reading (PRIMARY) by  Dr. Brigitte Pulse. Left rib x-ray. No acute abnormality.  EXAM: LEFT RIBS AND CHEST - 3+ VIEW  COMPARISON: Chest radiograph December 01, 2010  FINDINGS: Frontal chest as well as bilateral oblique and cone-down lower rib images were obtained. There is no edema or consolidation. Heart size and pulmonary vascularity are normal. No adenopathy.  There is no effusion or pneumothorax. There is no demonstrable rib fracture.  IMPRESSION: No demonstrable rib fracture. No pneumothorax. Lungs clear.  Results for orders placed in visit on 06/06/13  POCT UA - MICROSCOPIC ONLY      Result Value Ref Range   WBC, Ur, HPF, POC neg     RBC, urine, microscopic neg     Bacteria, U Microscopic neg     Mucus, UA neg     Epithelial cells, urine per micros 0-1     Crystals, Ur, HPF, POC neg     Casts, Ur, LPF, POC neg     Yeast, UA neg    POCT URINALYSIS DIPSTICK      Result Value Ref Range   Color, UA  light yellow     Clarity, UA clear     Glucose, UA neg  Bilirubin, UA neg     Ketones, UA neg     Spec Grav, UA 1.015     Blood, UA small     pH, UA 8.5     Protein, UA neg     Urobilinogen, UA 0.2     Nitrite, UA neg     Leukocytes, UA Negative       EKG: normal sinus rhythm, no acute abnormality. Possible left atrial enlargement, worsened compared to prior EKG November 2012. Her QTc is 433.  Assessment & Plan:  Type II or unspecified type diabetes mellitus without mention of complication, not stated as uncontrolled - pre-diabetes not requiring medication  OSTEOARTHRITIS, KNEE - Plan: Ambulatory referral to Orthopedic Surgery - pt was referred prev but then declined the appt - she would like to pursue this now for pain relief.  Pt really wanted more hydrocodone - reviewed concerns for increased fall risk w/ polypharmacy as she lives alone. She agrees to only use occ at night when severe pain is preventing her from sleep.  Strongly recommended PT but pt can't afford as medicaid doesn't cover.  Encouraged pt to investigate [employer] for silver sneakers or water therapy.  LEG EDEMA, BILATERAL - Plan: EKG 12-Lead, [name] Lower Ext Art Bilat - nonpitting so suspect this is more of a lipedema situation.  Already tends to be hyponatremic so will not increase diuretics further. Pt encouraged to increase compliance w/ her compression hose.  ANXIETY - Plan: escitalopram (LEXAPRO) 10 MG tablet, refilled xanax x 6 mos.  HYPERCHOLESTEROLEMIA - Plan: atorvastatin (LIPITOR) 40 MG tablet - pt w/ no known CAD (cardiac cath clean 2012) and DM is very mild - consider trial of stopping medication as may be exacerbating her chronic lower ext pain. Discuss w/ pt at f/u.  Left flank pain, chronic - Plan: POCT UA - Microscopic Only, POCT urinalysis dipstick, DG Ribs Unilateral W/Chest Left - unknown etiology - pt has been c/o this for years - documented at least back to 2013 and was on her problem list from prior.  Unknown etiology - assumed MSK but pt reports radiating down into Lt hip and LLQ abd/pelvic region. Unfortunately, pt is a VERY poor historian and often provides contradicting information depending upon the framing of the question. Trc hematuria in her 2 UAs this yr but no blood in microscopy. Colonoscopy UTD. Pt does not think she has had hysterectomy but may have had some repair after uterine prolapse. Discussed w/ pt that we may need to do a pelvic exam at f/u but currently pt has no concerning or alarm sxs.  Claudication of calf muscles - Plan: [name] Lower Ext Art Bilat  Meds ordered this encounter  Medications  . ALPRAZolam (XANAX) 1 MG tablet    Sig: take 1 tablet by mouth at bedtime if needed for sleep or anxiety    Dispense:  30 tablet    Refill:  5  . meclizine (ANTIVERT) 25 MG tablet    Sig: take 1 tablet by mouth UP TO 3 TIMES A DAY FOR DIZZINESS    Dispense:  30 tablet    Refill:  2  . escitalopram (LEXAPRO) 10 MG tablet    Sig: Take 1 tablet (10 mg total) by mouth daily.    Dispense:  30 tablet    Refill:  5  . atorvastatin (LIPITOR) 40 MG tablet    Sig: Take 1 tablet (40 mg total) by mouth every evening.    Dispense:  90 tablet  Refill:  1  . HYDROcodone-acetaminophen (NORCO/VICODIN) 5-325 MG per tablet    Sig: Take 1 tablet by mouth at bedtime as needed for moderate pain.    Dispense:  30 tablet    Refill:  0    I personally performed the services described in this documentation, which was scribed in my presence. The recorded information has been reviewed and considered, and addended by me as needed.  Delman Cheadle, MD MPH  Informed pt of medications which were refilled.  Encouraged pt to return for an appointment in 6 weeks with Dr. Everlene Farrier. Advised pt to return to clinic if symptoms worsen.   Over 40 minutes spent in face-face evaluation of pt.

## 2013-07-07 ENCOUNTER — Encounter: Payer: Self-pay | Admitting: Family Medicine

## 2013-07-07 DIAGNOSIS — M1710 Unilateral primary osteoarthritis, unspecified knee: Secondary | ICD-10-CM | POA: Insufficient documentation

## 2013-07-07 DIAGNOSIS — M1712 Unilateral primary osteoarthritis, left knee: Secondary | ICD-10-CM | POA: Insufficient documentation

## 2013-07-07 DIAGNOSIS — M1990 Unspecified osteoarthritis, unspecified site: Secondary | ICD-10-CM | POA: Insufficient documentation

## 2013-07-17 ENCOUNTER — Other Ambulatory Visit: Payer: Self-pay | Admitting: Family Medicine

## 2013-07-18 ENCOUNTER — Ambulatory Visit (INDEPENDENT_AMBULATORY_CARE_PROVIDER_SITE_OTHER): Payer: Medicare Other | Admitting: Emergency Medicine

## 2013-07-18 ENCOUNTER — Encounter: Payer: Self-pay | Admitting: Emergency Medicine

## 2013-07-18 VITALS — BP 152/80 | HR 82 | Temp 98.3°F | Resp 18 | Ht 62.0 in | Wt 182.8 lb

## 2013-07-18 DIAGNOSIS — E1365 Other specified diabetes mellitus with hyperglycemia: Secondary | ICD-10-CM

## 2013-07-18 DIAGNOSIS — R5381 Other malaise: Secondary | ICD-10-CM

## 2013-07-18 DIAGNOSIS — E119 Type 2 diabetes mellitus without complications: Secondary | ICD-10-CM

## 2013-07-18 DIAGNOSIS — R5383 Other fatigue: Secondary | ICD-10-CM

## 2013-07-18 LAB — COMPREHENSIVE METABOLIC PANEL
ALT: 26 U/L (ref 0–35)
AST: 19 U/L (ref 0–37)
Albumin: 4.5 g/dL (ref 3.5–5.2)
Alkaline Phosphatase: 93 U/L (ref 39–117)
BILIRUBIN TOTAL: 0.6 mg/dL (ref 0.2–1.2)
BUN: 10 mg/dL (ref 6–23)
CO2: 30 meq/L (ref 19–32)
CREATININE: 0.75 mg/dL (ref 0.50–1.10)
Calcium: 9.8 mg/dL (ref 8.4–10.5)
Chloride: 96 mEq/L (ref 96–112)
Glucose, Bld: 102 mg/dL — ABNORMAL HIGH (ref 70–99)
Potassium: 3.6 mEq/L (ref 3.5–5.3)
Sodium: 133 mEq/L — ABNORMAL LOW (ref 135–145)
Total Protein: 7.2 g/dL (ref 6.0–8.3)

## 2013-07-18 LAB — CBC WITH DIFFERENTIAL/PLATELET
Basophils Absolute: 0.1 10*3/uL (ref 0.0–0.1)
Basophils Relative: 1 % (ref 0–1)
EOS ABS: 0.2 10*3/uL (ref 0.0–0.7)
EOS PCT: 3 % (ref 0–5)
HCT: 36.8 % (ref 36.0–46.0)
Hemoglobin: 12.4 g/dL (ref 12.0–15.0)
LYMPHS ABS: 2.3 10*3/uL (ref 0.7–4.0)
Lymphocytes Relative: 37 % (ref 12–46)
MCH: 27.7 pg (ref 26.0–34.0)
MCHC: 33.7 g/dL (ref 30.0–36.0)
MCV: 82.3 fL (ref 78.0–100.0)
MONO ABS: 0.5 10*3/uL (ref 0.1–1.0)
Monocytes Relative: 8 % (ref 3–12)
Neutro Abs: 3.2 10*3/uL (ref 1.7–7.7)
Neutrophils Relative %: 51 % (ref 43–77)
PLATELETS: 288 10*3/uL (ref 150–400)
RBC: 4.47 MIL/uL (ref 3.87–5.11)
RDW: 15.8 % — ABNORMAL HIGH (ref 11.5–15.5)
WBC: 6.3 10*3/uL (ref 4.0–10.5)

## 2013-07-18 LAB — GLUCOSE, POCT (MANUAL RESULT ENTRY): POC Glucose: 120 mg/dl — AB (ref 70–99)

## 2013-07-18 LAB — POCT GLYCOSYLATED HEMOGLOBIN (HGB A1C): Hemoglobin A1C: 6

## 2013-07-18 LAB — TSH: TSH: 1.133 u[IU]/mL (ref 0.350–4.500)

## 2013-07-18 MED ORDER — GLUCOSE BLOOD VI STRP: ORAL_STRIP | Status: DC

## 2013-07-18 MED ORDER — HYDROCODONE-ACETAMINOPHEN 5-325 MG PO TABS: 1.0000 | ORAL_TABLET | Freq: Every evening | ORAL | Status: DC | PRN

## 2013-07-18 MED ORDER — POTASSIUM CHLORIDE CRYS ER 20 MEQ PO TBCR: 20.0000 meq | EXTENDED_RELEASE_TABLET | Freq: Once | ORAL | Status: DC

## 2013-07-18 NOTE — Progress Notes (Deleted)
   Subjective:    Patient ID: Casey GaussAda B Pina, female    DOB: Sep 07, 1935, 78 y.o.   MRN: [id number]  HPI    Review of Systems     Objective:   Physical Exam        Assessment & Plan:

## 2013-07-18 NOTE — Progress Notes (Signed)
   Subjective:    Patient ID: Casey [name], female    DOB: [birth year], 78 y.o.   MRN: [id number]  HPI  Patient is doing fine.  She does have some swelling in her ankles and would like a brace for them.  Sugar has been fine.  Takes all of her medication daily.  She takes the gabapentin and hydrocodone for pain in knees.  Had injections in her knees every wed in June by Dr. Althea CharonMcKinley.  No chest pain and no shortness of breath.  Patient does have insomnia.  Feels very tired.  She has support stockings but not wearing them due to high temperatures outside.      Review of Systems     Objective:   Physical Exam patient is alert and cooperative in no distress. Her neck supple chest clear heart regular rate no murmurs extremities reveal trace edema bilaterally. Results for orders placed in visit on 07/18/13  POCT GLYCOSYLATED HEMOGLOBIN (HGB A1C)      Result Value Ref Range   Hemoglobin A1C 6.0           Assessment & Plan:  Patient is doing well. Hemoglobin A1c is better weight is down routine labs were done to further evaluate her fatigue

## 2013-07-18 NOTE — Addendum Note (Signed)
Addended byCaffie Damme: LITTRELL, AMY W on: 07/18/2013 01:36 PM   Modules accepted: Orders

## 2013-07-19 ENCOUNTER — Encounter: Payer: Self-pay | Admitting: Podiatry

## 2013-07-19 ENCOUNTER — Ambulatory Visit (INDEPENDENT_AMBULATORY_CARE_PROVIDER_SITE_OTHER): Payer: Medicare Other | Admitting: Podiatry

## 2013-07-19 VITALS — BP 152/82 | HR 81 | Resp 16

## 2013-07-19 DIAGNOSIS — B351 Tinea unguium: Secondary | ICD-10-CM

## 2013-07-19 DIAGNOSIS — M79673 Pain in unspecified foot: Secondary | ICD-10-CM

## 2013-07-19 DIAGNOSIS — M79609 Pain in unspecified limb: Secondary | ICD-10-CM

## 2013-07-20 NOTE — Progress Notes (Signed)
Patient ID: Casey GaussAda B [name], female   DOB: Jun 14, 1935, 78 y.o.   MRN: [id number]  Subjective: This patient presents today complaining of painful toenails  Objective: Hypertrophic, incurvated, elongated toenails x10  Assessment: Symptomatic onychomycoses 6 through 10  Plan: Nails x10 are debrided without a bleeding   reappoint x3 months

## 2013-08-04 ENCOUNTER — Telehealth: Payer: Self-pay

## 2013-08-04 NOTE — Telephone Encounter (Signed)
Prescription sent to the pharmacy 6/30 for glucose strips with 5 refills.  Advised pt to call pharmacy.

## 2013-08-04 NOTE — Telephone Encounter (Signed)
Needs Glucose Blood (ACCU-CHEK AVIVA PLUS VI) [facility] [address].    [phone]

## 2013-08-11 ENCOUNTER — Other Ambulatory Visit: Payer: Self-pay | Admitting: Emergency Medicine

## 2013-08-25 ENCOUNTER — Other Ambulatory Visit: Payer: Self-pay

## 2013-08-25 DIAGNOSIS — Z1231 Encounter for screening mammogram for malignant neoplasm of breast: Secondary | ICD-10-CM

## 2013-09-07 ENCOUNTER — Ambulatory Visit
Admission: RE | Admit: 2013-09-07 | Discharge: 2013-09-07 | Disposition: A | Discharge status: Home / Self Care | Payer: Medicare Other | Source: Ambulatory Visit

## 2013-09-07 DIAGNOSIS — Z1231 Encounter for screening mammogram for malignant neoplasm of breast: Secondary | ICD-10-CM

## 2013-09-08 ENCOUNTER — Other Ambulatory Visit: Payer: Self-pay | Admitting: Family Medicine

## 2013-09-12 ENCOUNTER — Other Ambulatory Visit: Payer: Self-pay | Admitting: Family Medicine

## 2013-09-13 NOTE — Telephone Encounter (Signed)
Dr Clelia Croft, you saw pt for check up in May, but don't see this med discussed. Can we give RFs?

## 2013-09-20 ENCOUNTER — Other Ambulatory Visit: Payer: Self-pay | Admitting: Family Medicine

## 2013-10-10 ENCOUNTER — Telehealth: Payer: Self-pay | Admitting: Family Medicine

## 2013-10-10 MED ORDER — BLOOD GLUCOSE TEST VI STRP: ORAL_STRIP | Status: DC

## 2013-10-10 NOTE — Telephone Encounter (Signed)
Sent in refill of test strips. Pt advised.

## 2013-10-10 NOTE — Telephone Encounter (Signed)
Patient needs a refill on her test glucose strips.  [phone]

## 2013-10-18 ENCOUNTER — Ambulatory Visit: Payer: Medicare Other | Admitting: Podiatry

## 2013-10-23 ENCOUNTER — Ambulatory Visit (INDEPENDENT_AMBULATORY_CARE_PROVIDER_SITE_OTHER): Payer: Medicare Other | Admitting: Podiatry

## 2013-10-23 DIAGNOSIS — B351 Tinea unguium: Secondary | ICD-10-CM

## 2013-10-23 DIAGNOSIS — M79676 Pain in unspecified toe(s): Secondary | ICD-10-CM

## 2013-10-23 NOTE — Progress Notes (Signed)
Patient ID: Casey [name], female   DOB: 06-Aug-1935, 78 y.o.   MRN: [id number]  Subjective: This patient presents again complaining of painful toenails  Objective: Orientated x3 The toenails are elongated, incurvated, discolored, hypertrophic 6-10  Assessment: Symptomatic onychomycoses 6-10  Plan: Debrided toenails x10 without a bleeding  Reappoint x3 months

## 2013-10-31 ENCOUNTER — Encounter: Payer: Self-pay | Admitting: *Deleted

## 2013-11-20 ENCOUNTER — Ambulatory Visit (INDEPENDENT_AMBULATORY_CARE_PROVIDER_SITE_OTHER): Payer: Medicare Other | Admitting: Family Medicine

## 2013-11-20 VITALS — BP 140/90 | HR 81 | Temp 98.2°F | Resp 16 | Ht 63.5 in | Wt 189.0 lb

## 2013-11-20 DIAGNOSIS — N189 Chronic kidney disease, unspecified: Secondary | ICD-10-CM

## 2013-11-20 DIAGNOSIS — M170 Bilateral primary osteoarthritis of knee: Secondary | ICD-10-CM

## 2013-11-20 DIAGNOSIS — G894 Chronic pain syndrome: Secondary | ICD-10-CM

## 2013-11-20 DIAGNOSIS — F411 Generalized anxiety disorder: Secondary | ICD-10-CM

## 2013-11-20 DIAGNOSIS — E785 Hyperlipidemia, unspecified: Secondary | ICD-10-CM

## 2013-11-20 DIAGNOSIS — Z79899 Other long term (current) drug therapy: Secondary | ICD-10-CM

## 2013-11-20 DIAGNOSIS — I10 Essential (primary) hypertension: Secondary | ICD-10-CM

## 2013-11-20 DIAGNOSIS — R42 Dizziness and giddiness: Secondary | ICD-10-CM

## 2013-11-20 DIAGNOSIS — R5382 Chronic fatigue, unspecified: Secondary | ICD-10-CM

## 2013-11-20 DIAGNOSIS — E1122 Type 2 diabetes mellitus with diabetic chronic kidney disease: Secondary | ICD-10-CM

## 2013-11-20 LAB — COMPREHENSIVE METABOLIC PANEL
ALT: 11 U/L (ref 0–35)
AST: 17 U/L (ref 0–37)
Albumin: 4 g/dL (ref 3.5–5.2)
Alkaline Phosphatase: 103 U/L (ref 39–117)
BILIRUBIN TOTAL: 0.6 mg/dL (ref 0.2–1.2)
BUN: 9 mg/dL (ref 6–23)
CALCIUM: 9.7 mg/dL (ref 8.4–10.5)
CHLORIDE: 96 meq/L (ref 96–112)
CO2: 27 meq/L (ref 19–32)
Creat: 0.8 mg/dL (ref 0.50–1.10)
Glucose, Bld: 103 mg/dL — ABNORMAL HIGH (ref 70–99)
Potassium: 4.2 mEq/L (ref 3.5–5.3)
Sodium: 132 mEq/L — ABNORMAL LOW (ref 135–145)
Total Protein: 7.1 g/dL (ref 6.0–8.3)

## 2013-11-20 LAB — LIPID PANEL
Cholesterol: 161 mg/dL (ref 0–200)
HDL: 76 mg/dL (ref >39)
LDL CALC: 76 mg/dL (ref 0–99)
TRIGLYCERIDES: 46 mg/dL (ref <150)
Total CHOL/HDL Ratio: 2.1 Ratio
VLDL: 9 mg/dL (ref 0–40)

## 2013-11-20 LAB — POCT CBC
Granulocyte percent: 50.8 %G (ref 37–80)
HCT, POC: 38.7 % (ref 37.7–47.9)
HEMOGLOBIN: 12.5 g/dL (ref 12.2–16.2)
LYMPH, POC: 2.7 (ref 0.6–3.4)
MCH: 27.8 pg (ref 27–31.2)
MCHC: 32.3 g/dL (ref 31.8–35.4)
MCV: 86.1 fL (ref 80–97)
MID (cbc): 0.1 (ref 0–0.9)
MPV: 6.8 fL (ref 0–99.8)
POC Granulocyte: 3.3 (ref 2–6.9)
POC LYMPH PERCENT: 42.9 %L (ref 10–50)
POC MID %: 6.3 % (ref 0–12)
Platelet Count, POC: 322 10*3/uL (ref 142–424)
RBC: 4.49 M/uL (ref 4.04–5.48)
RDW, POC: 15.9 %
WBC: 6.4 10*3/uL (ref 4.6–10.2)

## 2013-11-20 LAB — TSH: TSH: 1.475 u[IU]/mL (ref 0.350–4.500)

## 2013-11-20 LAB — POCT GLYCOSYLATED HEMOGLOBIN (HGB A1C): HEMOGLOBIN A1C: 6

## 2013-11-20 LAB — VITAMIN B12: Vitamin B-12: 579 pg/mL (ref 211–911)

## 2013-11-20 MED ORDER — MECLIZINE HCL 25 MG PO TABS: ORAL_TABLET | ORAL | Status: DC

## 2013-11-20 MED ORDER — TRIAMTERENE-HCTZ 37.5-25 MG PO TABS: 1.0000 | ORAL_TABLET | Freq: Every day | ORAL | Status: DC

## 2013-11-20 MED ORDER — POTASSIUM CHLORIDE CRYS ER 20 MEQ PO TBCR: 20.0000 meq | EXTENDED_RELEASE_TABLET | Freq: Once | ORAL | Status: DC

## 2013-11-20 MED ORDER — GABAPENTIN 300 MG PO CAPS: ORAL_CAPSULE | ORAL | Status: DC

## 2013-11-20 MED ORDER — ESCITALOPRAM OXALATE 10 MG PO TABS: 10.0000 mg | ORAL_TABLET | Freq: Every day | ORAL | Status: DC

## 2013-11-20 MED ORDER — BLOOD GLUCOSE TEST VI STRP: ORAL_STRIP | Status: DC

## 2013-11-20 MED ORDER — ALPRAZOLAM 1 MG PO TABS: ORAL_TABLET | ORAL | Status: DC

## 2013-11-20 MED ORDER — HYDROCODONE-ACETAMINOPHEN 5-325 MG PO TABS: 1.0000 | ORAL_TABLET | Freq: Every evening | ORAL | Status: DC | PRN

## 2013-11-20 NOTE — Patient Instructions (Addendum)
Stop your atorvastatin - I want to know if this is causing you more muscle aches and pains.  If you muscle pain gets better, then we will see if we need to start a different type of medicine for your cholesterol.  If your pain is unchanged, then we will restart the atorvastatin. It is likely that the meclizine and the gabapentin which you are taking both 3 times a day are making your sleepy and fatigued. We will have an ENT doctor evaluate you to see if there is anything else that can help with your dizziness. It is ok to take the advil 2 pills with breakfast and 2 with supper. If you start getting any stomach pain, indigestions, heartburn, or black tarry stools, stop immediatly and come into clinic for evaluation. The hydrocodone will make you dizzy so only take this at night if you can't sleep due to pain. Restart your vitamin B12 supplement.

## 2013-11-20 NOTE — Progress Notes (Signed)
Subjective:  This chart was scribed for [name], MD by Carl Best, Medical Scribe. This patient was seen in Room 9 and the patient's care was started at 9:11 AM.   Patient ID: Casey [name], female    DOB: 06/10/1935, 78 y.o.   MRN: [id number]  Patient Active Problem List   Diagnosis Date Noted  . DJD (degenerative joint disease) of knee 07/07/2013  . Type II or unspecified type diabetes mellitus without mention of complication, not stated as uncontrolled 05/26/2012  . Vitamin D deficiency disease 10/23/2011  . Anemia 10/23/2011  . Encounter for long-term (current) use of other medications 10/23/2011  . Diabetes mellitus 10/23/2011  . Hyponatremia 12/01/2010    Class: Acute  . Chest pain 11/26/2010    Class: Acute  . Long Q-T syndrome 11/26/2010  . CYSTOCELE WITH INCOMPLETE UTERINE PROLAPSE 03/10/2010  . HYPERGLYCEMIA 02/06/2010  . ESOPHAGEAL STRICTURE 12/11/2009  . OBESITY 12/09/2009  . PROBLEMS WITH SWALLOWING AND MASTICATION 12/09/2009  . MICROSCOPIC HEMATURIA 07/29/2009  . ARM PAIN, RIGHT 03/28/2009  . FATIGUE 03/28/2009  . CONSTIPATION 09/25/2008  . SINUSITIS, RECURRENT 02/23/2008  . OSTEOARTHRITIS, KNEE 09/28/2007  . LEG EDEMA, BILATERAL 08/31/2007  . FLANK PAIN, LEFT 06/08/2007  . SPRAIN&STRAIN OTH SPEC SITES SHOULDER&UPPER ARM 06/08/2007  . BENIGN POSITIONAL VERTIGO 04/25/2007  . HEADACHE 03/24/2007  . HYPERCHOLESTEROLEMIA 03/03/2007  . ANXIETY 03/03/2007  . HYPERTENSION, BENIGN ESSENTIAL 03/03/2007  . ALLERGIC RHINITIS 03/03/2007  . HIP PAIN, LEFT 03/03/2007  . OSTEOPOROSIS 03/03/2007    HPI HPI Comments: Casey [name] is a 78 y.o. female with a history of Type II DM who presents to the [facility] for a medication refill.    She is also complaining of constant fatigue and decreased energy.  She is able to sleep well at night after taking Xanax.  She takes Vitamin D and stopped taking Vitamin B12 after hearing about its adverse side  effects.    Her DM is diet controlled.  She was last seen for her Type II DM 4 months ago by Dr. Cleta Alberts and her A1C was 6.0 at that time.  There were no medication changes made.  She checks her sugars regularly and states that her numbers have been stable.    Patient sees Dr. Althea Charon for knee injections and takes Gabapentin and Hydrocodone for knee arthritis.  She has support stockings at home for her lower extremity edema.  She has seen podiatry for toenail fungus and foot pain.  She no longer sees Dr. Althea Charon.    She takes Meclizine chronically due to longstanding history of vertigo.  She takes Meclizine TID.  She has not seen an otolaryngologist in years.  She lists intermittent hearing loss an associated symptom but states that it may be due to cerumen impaction.      She had a normal cardiac catheterization in 2012 and had discussed stopping her Lipitor wondering if this may be contributing to her musculoskeletal pain.  She will experience intermittent leg and knee pain but takes Advil QID with relief to her symptoms.  She denies abdominal pain, heartburn, and indigestion as associated symptoms.    At her last office visit, patient was complaining of bilateral claudication-type symptoms.  Arterial ultrasounds were ordered but never scheduled nor completed.    Past Medical History  Diagnosis Date  . HYPERTENSION, BENIGN ESSENTIAL 03/03/2007  . HYPERCHOLESTEROLEMIA   . SPRAIN&STRAIN OTH SPEC SITES SHOULDER&UPPER ARM   . SINUSITIS, RECURRENT   .  Problems with swallowing and mastication   . OSTEOPOROSIS   . OSTEOARTHRITIS, KNEE   . OBESITY   . Microscopic hematuria   . LEG EDEMA, BILATERAL   . HYPERGLYCEMIA   . HIP PAIN, LEFT   . Headache(784.0)   . FLANK PAIN, LEFT   . FATIGUE   . ESOPHAGEAL STRICTURE   . CYSTOCELE WITH INCOMPLETE UTERINE PROLAPSE   . CONSTIPATION   . BENIGN POSITIONAL VERTIGO   . ARM PAIN, RIGHT   . ANXIETY   . ALLERGIC RHINITIS   . Carcinoma in situ of colon     . Peripheral neuropathy, idiopathic    History reviewed. No pertinent past surgical history. Family History  Problem Relation Age of Onset  . Colon cancer    . Diabetes    . Coronary artery disease Mother 15  . Cancer Father 76  . Heart disease Father   . Coronary artery disease Sister   . Diabetes type II Sister   . Cancer Sister   . Coronary artery disease Brother   . Kidney disease Brother   . Coronary artery disease Brother    History   Social History  . Marital Status: Single    Spouse Name: N/A    Number of Children: N/A  . Years of Education: N/A   Occupational History  . Not on file.   Social History Main Topics  . Smoking status: Never Smoker   . Smokeless tobacco: Never Used  . Alcohol Use: No  . Drug Use: No  . Sexual Activity: Not Currently   Other Topics Concern  . Not on file   Social History Narrative   No Known Allergies  Review of Systems  Constitutional: Positive for fatigue.  HENT: Positive for hearing loss.   Gastrointestinal: Negative for abdominal pain.  Musculoskeletal: Positive for arthralgias.    Objective:  BP 140/90 mmHg  Pulse 81  Temp(Src) 98.2 F (36.8 C) (Oral)  Resp 16  Ht 5' 3.5" (1.613 m)  Wt 189 lb (85.73 kg)  BMI 32.95 kg/m2  SpO2 100%   Physical Exam  Constitutional: She is oriented to person, place, and time. She appears well-developed and well-nourished.  HENT:  Head: Normocephalic and atraumatic.  Right Ear: Hearing, tympanic membrane, external ear and ear canal normal.  Left Ear: Hearing, tympanic membrane, external ear and ear canal normal.  Nose: Mucosal edema present.  Mouth/Throat: Uvula is midline, oropharynx is clear and moist and mucous membranes are normal.  Eyes: EOM are normal.  Neck: Normal range of motion. Neck supple.  Cardiovascular: Normal rate, regular rhythm and normal heart sounds.   No murmur heard. Pulmonary/Chest: Effort normal and breath sounds normal. No respiratory distress. She  has no wheezes. She has no rales.  Musculoskeletal: Normal range of motion.  Lymphadenopathy:    She has no cervical adenopathy.  Neurological: She is alert and oriented to person, place, and time.  Skin: Skin is warm and dry.  Psychiatric: She has a normal mood and affect. Her behavior is normal.  Nursing note and vitals reviewed.   Results for orders placed or performed in visit on 11/20/13  POCT CBC  Result Value Ref Range   WBC 6.4 4.6 - 10.2 K/uL   Lymph, poc 2.7 0.6 - 3.4   POC LYMPH PERCENT 42.9 10 - 50 %L   MID (cbc) 0.1 0 - 0.9   POC MID % 6.3 0 - 12 %M   POC Granulocyte 3.3 2 - 6.9  Granulocyte percent 50.8 37 - 80 %G   RBC 4.49 4.04 - 5.48 M/uL   Hemoglobin 12.5 12.2 - 16.2 g/dL   HCT, POC 40.9 81.1 - 47.9 %   MCV 86.1 80 - 97 fL   MCH, POC 27.8 27 - 31.2 pg   MCHC 32.3 31.8 - 35.4 g/dL   RDW, POC 91.4 %   Platelet Count, POC 322 142 - 424 K/uL   MPV 6.8 0 - 99.8 fL  POCT glycosylated hemoglobin (Hb A1C)  Result Value Ref Range   Hemoglobin A1C 6.0     Assessment & Plan:  Accelerated hypertension - Plan: POCT CBC, TSH, CANCELED: EKG 12-Lead  Chronic fatigue - Plan: POCT CBC, Vitamin B12, Vit D  25 hydroxy (rtn osteoporosis monitoring) - recommend restarting vitamin b12 supp  Type 2 diabetes mellitus with diabetic chronic kidney disease - Plan: POCT glycosylated hemoglobin (Hb A1C), TSH, Comprehensive metabolic panel  Chronic pain syndrome - cont ibuprofen 400 mg bid - sig relief w/ this, warned of gastritis sxs, ok to use hydrocodone at night if pain is inhibiting sleep but warned of fall risk on this med.  Primary osteoarthritis of both knees - Plan: Vitamin B12, Vit D  25 hydroxy (rtn osteoporosis monitoring)  Vertigo - Plan: Ambulatory referral to ENT - using meclizine tid chronically for sxs but likely contributing to daytime fatigue - rec ENT c/s to see if there is anything else that could help sxs w/o the sedation.  Polypharmacy - Plan: Vitamin B12,  Vit D  25 hydroxy (rtn osteoporosis monitoring)  Hyperlipidemia LDL goal <100 - Plan: Lipid panel - Concern that lipitor may be exacerbating myalgias - do trial off, if no decrease in pain sxs will restart atorvastatin.  Anxiety state - Plan: escitalopram (LEXAPRO) 10 MG tablet  - uses xanax qhs only for insomnia.  Meds ordered this encounter  Medications  . ALPRAZolam (XANAX) 1 MG tablet    Sig: take 1 tablet by mouth at bedtime if needed for sleep or anxiety    Dispense:  30 tablet    Refill:  5  . escitalopram (LEXAPRO) 10 MG tablet    Sig: Take 1 tablet (10 mg total) by mouth daily.    Dispense:  30 tablet    Refill:  11  . gabapentin (NEURONTIN) 300 MG capsule    Sig: take 1 capsule by mouth three times a day    Dispense:  90 capsule    Refill:  3  . HYDROcodone-acetaminophen (NORCO/VICODIN) 5-325 MG per tablet    Sig: Take 1 tablet by mouth at bedtime as needed for moderate pain.    Dispense:  90 tablet    Refill:  0  . meclizine (ANTIVERT) 25 MG tablet    Sig: take 1 tablet by mouth UP TO THREE TIMES A DAY FOR DIZENESS    Dispense:  90 tablet    Refill:  2  . potassium chloride SA (K-DUR,KLOR-CON) 20 MEQ tablet    Sig: Take 1 tablet (20 mEq total) by mouth once.    Dispense:  30 tablet    Refill:  3  . triamterene-hydrochlorothiazide (MAXZIDE-25) 37.5-25 MG per tablet    Sig: Take 1 tablet by mouth daily.    Dispense:  30 tablet    Refill:  11  . Glucose Blood (BLOOD GLUCOSE TEST STRIPS) STRP    Sig: Test glucose once daily DX 250.00    Dispense:  50 each    Refill:  11  Please provide pt with the strips that corospond to blood glucose meter pt has.    I personally performed the services described in this documentation, which was scribed in my presence. The recorded information has been reviewed and considered, and addended by me as needed.  [name], MD MPH

## 2013-11-21 ENCOUNTER — Encounter: Payer: Self-pay | Admitting: Family Medicine

## 2013-11-21 ENCOUNTER — Telehealth: Payer: Self-pay | Admitting: Family Medicine

## 2013-11-21 ENCOUNTER — Telehealth: Payer: Self-pay

## 2013-11-21 LAB — VITAMIN D 25 HYDROXY (VIT D DEFICIENCY, FRACTURES): VIT D 25 HYDROXY: 34 ng/mL (ref 30–89)

## 2013-11-21 NOTE — Telephone Encounter (Signed)
[facility] faxed Medicare DM supplies form which I completed and put in Dr Alver FisherShaw's box for signature.

## 2013-11-21 NOTE — Telephone Encounter (Signed)
-----   Message from Eva N Shaw, MD sent at 11/20/2013 10:17 AM EST ----- Please arrange f/u OV at 104 in 3 months 

## 2013-11-21 NOTE — Telephone Encounter (Signed)
Unable to leave message. No voice mail set up on home phone, no other numbers provided

## 2013-11-24 ENCOUNTER — Telehealth: Payer: Self-pay | Admitting: Family Medicine

## 2013-11-24 NOTE — Telephone Encounter (Signed)
-----   Message from Sherren MochaEva N Shaw, MD sent at 11/20/2013 10:17 AM EST ----- Please arrange f/u OV at 104 in 3 months

## 2013-11-24 NOTE — Telephone Encounter (Signed)
No Voicemail set up on home phone. Unable to leave message

## 2013-12-01 ENCOUNTER — Other Ambulatory Visit: Payer: Self-pay

## 2013-12-01 MED ORDER — BLOOD GLUCOSE TEST VI STRP: ORAL_STRIP | Status: DC

## 2013-12-01 NOTE — Telephone Encounter (Signed)
Pt called to report pharm didn't get this Rx when sent on 11/20/13. Resending.

## 2013-12-05 NOTE — Telephone Encounter (Signed)
Received another copy of this form. Completed again and had Dr Clelia CroftShaw sign. Faxed to [facility].

## 2013-12-05 NOTE — Progress Notes (Signed)
Tried to call patient. No answer. No voicemail. °

## 2013-12-07 NOTE — Progress Notes (Signed)
Appointment scheduled for 03/09/13 @ 8:00.

## 2013-12-28 ENCOUNTER — Encounter (HOSPITAL_COMMUNITY): Payer: Self-pay | Admitting: Cardiology

## 2014-01-29 ENCOUNTER — Ambulatory Visit (INDEPENDENT_AMBULATORY_CARE_PROVIDER_SITE_OTHER): Payer: Medicare Other | Admitting: Podiatry

## 2014-01-29 ENCOUNTER — Encounter: Payer: Self-pay | Admitting: Podiatry

## 2014-01-29 DIAGNOSIS — M79676 Pain in unspecified toe(s): Secondary | ICD-10-CM

## 2014-01-29 DIAGNOSIS — B351 Tinea unguium: Secondary | ICD-10-CM | POA: Diagnosis not present

## 2014-01-29 NOTE — Patient Instructions (Signed)
Diabetes and Foot Care Diabetes may cause you to have problems because of poor blood supply (circulation) to your feet and legs. This may cause the skin on your feet to become thinner, break easier, and heal more slowly. Your skin may become dry, and the skin may peel and crack. You may also have nerve damage in your legs and feet causing decreased feeling in them. You may not notice minor injuries to your feet that could lead to infections or more serious problems. Taking care of your feet is one of the most important things you can do for yourself.  HOME CARE INSTRUCTIONS  Wear shoes at all times, even in the house. Do not go barefoot. Bare feet are easily injured.  Check your feet daily for blisters, cuts, and redness. If you cannot see the bottom of your feet, use a mirror or ask someone for help.  Wash your feet with warm water (do not use hot water) and mild soap. Then pat your feet and the areas between your toes until they are completely dry. Do not soak your feet as this can dry your skin.  Apply a moisturizing lotion or petroleum jelly (that does not contain alcohol and is unscented) to the skin on your feet and to dry, brittle toenails. Do not apply lotion between your toes.  Trim your toenails straight across. Do not dig under them or around the cuticle. File the edges of your nails with an emery board or nail file.  Do not cut corns or calluses or try to remove them with medicine.  Wear clean socks or stockings every day. Make sure they are not too tight. Do not wear knee-high stockings since they may decrease blood flow to your legs.  Wear shoes that fit properly and have enough cushioning. To break in new shoes, wear them for just a few hours a day. This prevents you from injuring your feet. Always look in your shoes before you put them on to be sure there are no objects inside.  Do not cross your legs. This may decrease the blood flow to your feet.  If you find a minor scrape,  cut, or break in the skin on your feet, keep it and the skin around it clean and dry. These areas may be cleansed with mild soap and water. Do not cleanse the area with peroxide, alcohol, or iodine.  When you remove an adhesive bandage, be sure not to damage the skin around it.  If you have a wound, look at it several times a day to make sure it is healing.  Do not use heating pads or hot water bottles. They may burn your skin. If you have lost feeling in your feet or legs, you may not know it is happening until it is too late.  Make sure your health care provider performs a complete foot exam at least annually or more often if you have foot problems. Report any cuts, sores, or bruises to your health care provider immediately. SEEK MEDICAL CARE IF:   You have an injury that is not healing.  You have cuts or breaks in the skin.  You have an ingrown nail.  You notice redness on your legs or feet.  You feel burning or tingling in your legs or feet.  You have pain or cramps in your legs and feet.  Your legs or feet are numb.  Your feet always feel cold. SEEK IMMEDIATE MEDICAL CARE IF:   There is increasing redness,   swelling, or pain in or around a wound.  There is a red line that goes up your leg.  Pus is coming from a wound.  You develop a fever or as directed by your health care provider.  You notice a bad smell coming from an ulcer or wound. Document Released: 01/03/2000 Document Revised: 09/07/2012 Document Reviewed: 06/14/2012 [employer] Patient Information 2015 [employer]. This information is not intended to replace advice given to you by your health care provider. Make sure you discuss any questions you have with your health care provider.  

## 2014-01-30 NOTE — Progress Notes (Signed)
Patient ID: Casey GaussAda B [name], female   DOB: May 23, 1935, 79 y.o.   MRN: [id number]  Subjective: This patient presents complaining of painful toenails walking wearing shoes  Objective: debridement toenails are elongated, hypertrophic, discolored, incurvated and tender to palpation 6-10  Assessment: Symptomatic onychomycoses 6-10  Plan: Debridement of toenails 10 without a bleeding  Reappoint 3 months

## 2014-03-09 ENCOUNTER — Ambulatory Visit (INDEPENDENT_AMBULATORY_CARE_PROVIDER_SITE_OTHER): Payer: Medicare Other | Admitting: Family Medicine

## 2014-03-09 ENCOUNTER — Encounter: Payer: Self-pay | Admitting: Family Medicine

## 2014-03-09 VITALS — BP 120/70 | HR 71 | Temp 97.8°F | Resp 16 | Ht 62.5 in | Wt 198.6 lb

## 2014-03-09 DIAGNOSIS — R42 Dizziness and giddiness: Secondary | ICD-10-CM | POA: Diagnosis not present

## 2014-03-09 DIAGNOSIS — E1122 Type 2 diabetes mellitus with diabetic chronic kidney disease: Secondary | ICD-10-CM

## 2014-03-09 DIAGNOSIS — IMO0002 Reserved for concepts with insufficient information to code with codable children: Secondary | ICD-10-CM

## 2014-03-09 DIAGNOSIS — G4700 Insomnia, unspecified: Secondary | ICD-10-CM | POA: Diagnosis not present

## 2014-03-09 DIAGNOSIS — N189 Chronic kidney disease, unspecified: Secondary | ICD-10-CM | POA: Diagnosis not present

## 2014-03-09 DIAGNOSIS — E119 Type 2 diabetes mellitus without complications: Secondary | ICD-10-CM

## 2014-03-09 DIAGNOSIS — R5382 Chronic fatigue, unspecified: Secondary | ICD-10-CM | POA: Diagnosis not present

## 2014-03-09 DIAGNOSIS — I10 Essential (primary) hypertension: Secondary | ICD-10-CM

## 2014-03-09 DIAGNOSIS — J0121 Acute recurrent ethmoidal sinusitis: Secondary | ICD-10-CM | POA: Diagnosis not present

## 2014-03-09 DIAGNOSIS — M179 Osteoarthritis of knee, unspecified: Secondary | ICD-10-CM | POA: Diagnosis not present

## 2014-03-09 DIAGNOSIS — R7303 Prediabetes: Secondary | ICD-10-CM

## 2014-03-09 DIAGNOSIS — M1710 Unilateral primary osteoarthritis, unspecified knee: Secondary | ICD-10-CM

## 2014-03-09 DIAGNOSIS — E7800 Pure hypercholesterolemia, unspecified: Secondary | ICD-10-CM

## 2014-03-09 DIAGNOSIS — R7309 Other abnormal glucose: Secondary | ICD-10-CM | POA: Diagnosis not present

## 2014-03-09 LAB — COMPLETE METABOLIC PANEL WITH GFR
ALBUMIN: 3.9 g/dL (ref 3.5–5.2)
ALK PHOS: 76 U/L (ref 39–117)
ALT: 14 U/L (ref 0–35)
AST: 15 U/L (ref 0–37)
BUN: 7 mg/dL (ref 6–23)
CO2: 27 mEq/L (ref 19–32)
Calcium: 9.2 mg/dL (ref 8.4–10.5)
Chloride: 95 mEq/L — ABNORMAL LOW (ref 96–112)
Creat: 0.73 mg/dL (ref 0.50–1.10)
GFR, EST NON AFRICAN AMERICAN: 80 mL/min
GFR, Est African American: >89 mL/min
Glucose, Bld: 100 mg/dL — ABNORMAL HIGH (ref 70–99)
Potassium: 4 mEq/L (ref 3.5–5.3)
Sodium: 132 mEq/L — ABNORMAL LOW (ref 135–145)
Total Bilirubin: 0.4 mg/dL (ref 0.2–1.2)
Total Protein: 6.5 g/dL (ref 6.0–8.3)

## 2014-03-09 LAB — POCT CBC
GRANULOCYTE PERCENT: 59.1 % (ref 37–80)
HEMATOCRIT: 34.1 % — AB (ref 37.7–47.9)
Hemoglobin: 11.3 g/dL — AB (ref 12.2–16.2)
LYMPH, POC: 2.4 (ref 0.6–3.4)
MCH, POC: 27.9 pg (ref 27–31.2)
MCHC: 33.1 g/dL (ref 31.8–35.4)
MCV: 84.3 fL (ref 80–97)
MID (CBC): 0.5 (ref 0–0.9)
MPV: 6.1 fL (ref 0–99.8)
POC Granulocyte: 4.3 (ref 2–6.9)
POC LYMPH %: 33.5 % (ref 10–50)
POC MID %: 7.4 %M (ref 0–12)
Platelet Count, POC: 350 10*3/uL (ref 142–424)
RBC: 4.04 M/uL (ref 4.04–5.48)
RDW, POC: 14.7 %
WBC: 7.3 10*3/uL (ref 4.6–10.2)

## 2014-03-09 LAB — POCT GLYCOSYLATED HEMOGLOBIN (HGB A1C): Hemoglobin A1C: 6.1

## 2014-03-09 LAB — TSH: TSH: 1.207 u[IU]/mL (ref 0.350–4.500)

## 2014-03-09 LAB — LIPID PANEL
CHOL/HDL RATIO: 2.9 ratio
CHOLESTEROL: 211 mg/dL — AB (ref 0–200)
HDL: 73 mg/dL (ref >39)
LDL Cholesterol: 127 mg/dL — ABNORMAL HIGH (ref 0–99)
Triglycerides: 55 mg/dL (ref <150)
VLDL: 11 mg/dL (ref 0–40)

## 2014-03-09 LAB — GLUCOSE, POCT (MANUAL RESULT ENTRY): POC Glucose: 115 mg/dl — AB (ref 70–99)

## 2014-03-09 MED ORDER — IPRATROPIUM BROMIDE 0.03 % NA SOLN: 2.0000 | Freq: Four times a day (QID) | NASAL | Status: DC

## 2014-03-09 MED ORDER — AMOXICILLIN 875 MG PO TABS
875.0000 mg | ORAL_TABLET | Freq: Three times a day (TID) | ORAL | Status: DC
Start: 2014-03-09 — End: 2014-06-08

## 2014-03-09 MED ORDER — OMEPRAZOLE 20 MG PO CPDR: 20.0000 mg | DELAYED_RELEASE_CAPSULE | Freq: Every day | ORAL | Status: DC

## 2014-03-09 MED ORDER — HYDROCODONE-ACETAMINOPHEN 5-325 MG PO TABS: 1.0000 | ORAL_TABLET | Freq: Every evening | ORAL | Status: DC | PRN

## 2014-03-09 NOTE — Patient Instructions (Signed)
Hot showers or breathing in steam may help loosen the congestion.  Using a netti pot or sinus rinse is also likely to help you feel better and keep this from progressing.  Use the atrovent nasal spray as needed throughout the day.  I recommend augmenting with generic mucinex to help you move out the congestion.  If no improvement or you are getting worse, come back as you might need a course of steroids but hopefully with all of the above, you can avoid it.   Sinusitis Sinusitis is redness, soreness, and inflammation of the paranasal sinuses. Paranasal sinuses are air pockets within the bones of your face (beneath the eyes, the middle of the forehead, or above the eyes). In healthy paranasal sinuses, mucus is able to drain out, and air is able to circulate through them by way of your nose. However, when your paranasal sinuses are inflamed, mucus and air can become trapped. This can allow bacteria and other germs to grow and cause infection. Sinusitis can develop quickly and last only a short time (acute) or continue over a long period (chronic). Sinusitis that lasts for more than 12 weeks is considered chronic.  CAUSES  Causes of sinusitis include:  Allergies.  Structural abnormalities, such as displacement of the cartilage that separates your nostrils (deviated septum), which can decrease the air flow through your nose and sinuses and affect sinus drainage.  Functional abnormalities, such as when the small hairs (cilia) that line your sinuses and help remove mucus do not work properly or are not present. SIGNS AND SYMPTOMS  Symptoms of acute and chronic sinusitis are the same. The primary symptoms are pain and pressure around the affected sinuses. Other symptoms include:  Upper toothache.  Earache.  Headache.  Bad breath.  Decreased sense of smell and taste.  A cough, which worsens when you are lying flat.  Fatigue.  Fever.  Thick drainage from your nose, which often is green and  may contain pus (purulent).  Swelling and warmth over the affected sinuses. DIAGNOSIS  Your health care provider will perform a physical exam. During the exam, your health care provider may:  Look in your nose for signs of abnormal growths in your nostrils (nasal polyps).  Tap over the affected sinus to check for signs of infection.  View the inside of your sinuses (endoscopy) using an imaging device that has a light attached (endoscope). If your health care provider suspects that you have chronic sinusitis, one or more of the following tests may be recommended:  Allergy tests.  Nasal culture. A sample of mucus is taken from your nose, sent to a lab, and screened for bacteria.  Nasal cytology. A sample of mucus is taken from your nose and examined by your health care provider to determine if your sinusitis is related to an allergy. TREATMENT  Most cases of acute sinusitis are related to a viral infection and will resolve on their own within 10 days. Sometimes medicines are prescribed to help relieve symptoms (pain medicine, decongestants, nasal steroid sprays, or saline sprays).  However, for sinusitis related to a bacterial infection, your health care provider will prescribe antibiotic medicines. These are medicines that will help kill the bacteria causing the infection.  Rarely, sinusitis is caused by a fungal infection. In theses cases, your health care provider will prescribe antifungal medicine. For some cases of chronic sinusitis, surgery is needed. Generally, these are cases in which sinusitis recurs more than 3 times per year, despite other treatments. HOME CARE  INSTRUCTIONS   Drink plenty of water. Water helps thin the mucus so your sinuses can drain more easily.  Use a humidifier.  Inhale steam 3 to 4 times a day (for example, sit in the bathroom with the shower running).  Apply a warm, moist washcloth to your face 3 to 4 times a day, or as directed by your health care  provider.  Use saline nasal sprays to help moisten and clean your sinuses.  Take medicines only as directed by your health care provider.  If you were prescribed either an antibiotic or antifungal medicine, finish it all even if you start to feel better. SEEK IMMEDIATE MEDICAL CARE IF:  You have increasing pain or severe headaches.  You have nausea, vomiting, or drowsiness.  You have swelling around your face.  You have vision problems.  You have a stiff neck.  You have difficulty breathing. MAKE SURE YOU:   Understand these instructions.  Will watch your condition.  Will get help right away if you are not doing well or get worse. Document Released: 01/05/2005 Document Revised: 05/22/2013 Document Reviewed: 01/20/2011 [facility] Patient Information 2015 [place], [place]. This information is not intended to replace advice given to you by your health care provider. Make sure you discuss any questions you have with your health care provider.

## 2014-03-11 ENCOUNTER — Encounter: Payer: Self-pay | Admitting: Family Medicine

## 2014-03-11 DIAGNOSIS — R7303 Prediabetes: Secondary | ICD-10-CM | POA: Insufficient documentation

## 2014-03-11 NOTE — Progress Notes (Signed)
Subjective:    Patient ID: Casey [name], female    DOB: Apr 03, 1935, 79 y.o.   MRN: [id number] This chart was scribed for Sherren Mocha, MD by Leona Carry, ED Scribe. The patient was seen in 24. The patient's care was started at 8:36 AM.   Chief Complaint  Patient presents with  . Follow-up  . Diabetes    Diabetes Hypoglycemia symptoms include headaches.   HPI Comments: Casey [name] is a 79 y.o. female who presents today complaining of congestion, bilateral post-oricular tenderness, and a headache in the ethmoidal sinus.  Patient states that she has experienced progressively worsening URI symptoms for the past two weeks. She states that her sugar levels have been stable, in the 80-90 range. She report mild depression.  Patient states that her vertigo symptoms are unchanged.     Patient reports one episode of a hot flash, but states that it has gotten better since menopause.    Patient is currently taking Allegra-D for her allergies but states that it does not provide relief of symptoms.  She also takes Xanax, which she reports helps her sleep.    Pt lives in [place] alone - has 1 son who is out of state (poss [place]?) Past Medical History  Diagnosis Date  . HYPERTENSION, BENIGN ESSENTIAL 03/03/2007  . HYPERCHOLESTEROLEMIA   . SPRAIN&STRAIN OTH SPEC SITES SHOULDER&UPPER ARM   . SINUSITIS, RECURRENT   . Problems with swallowing and mastication   . OSTEOPOROSIS   . OSTEOARTHRITIS, KNEE   . OBESITY   . Microscopic hematuria   . LEG EDEMA, BILATERAL   . HYPERGLYCEMIA   . HIP PAIN, LEFT   . Headache(784.0)   . FLANK PAIN, LEFT   . FATIGUE   . ESOPHAGEAL STRICTURE   . CYSTOCELE WITH INCOMPLETE UTERINE PROLAPSE   . CONSTIPATION   . BENIGN POSITIONAL VERTIGO   . ARM PAIN, RIGHT   . ANXIETY   . ALLERGIC RHINITIS   . Carcinoma in situ of colon   . Peripheral neuropathy, idiopathic    Current Outpatient Prescriptions on File Prior to Visit  Medication Sig Dispense Refill  .  ALPRAZolam (XANAX) 1 MG tablet take 1 tablet by mouth at bedtime if needed for sleep or anxiety 30 tablet 5  . cholecalciferol (VITAMIN D) 1000 UNITS tablet Take 1,000 Units by mouth daily.    . cyanocobalamin 500 MCG tablet Take 500 mcg by mouth daily.     Marland Kitchen escitalopram (LEXAPRO) 10 MG tablet Take 1 tablet (10 mg total) by mouth daily. 30 tablet 11  . gabapentin (NEURONTIN) 300 MG capsule take 1 capsule by mouth three times a day 90 capsule 3  . Glucose Blood (BLOOD GLUCOSE TEST STRIPS) STRP Test glucose once daily DX 250.00 50 each 11  . meclizine (ANTIVERT) 25 MG tablet take 1 tablet by mouth UP TO THREE TIMES A DAY FOR DIZENESS 90 tablet 2  . potassium chloride SA (K-DUR,KLOR-CON) 20 MEQ tablet Take 1 tablet (20 mEq total) by mouth once. 30 tablet 3  . triamterene-hydrochlorothiazide (MAXZIDE-25) 37.5-25 MG per tablet Take 1 tablet by mouth daily. 30 tablet 11   No current facility-administered medications on file prior to visit.   No Known Allergies  Review of Systems  Constitutional: Positive for chills.  HENT: Positive for congestion.   Neurological: Positive for headaches.       Objective:   Physical Exam  Constitutional: She is oriented to person, place, and time. She appears well-developed  and well-nourished. No distress.  HENT:  Head: Normocephalic and atraumatic.  Mouth/Throat: Oropharynx is clear and moist.  TM erythema and retraction on the left.  TM normal on the right. Pale, boggy nasal mucosa.   Eyes: Conjunctivae and EOM are normal.  Neck: Neck supple. No tracheal deviation present.  No pre or post oricular lymphadenopathy.   Cardiovascular: Normal rate, regular rhythm and normal heart sounds.   Pulmonary/Chest: Effort normal and breath sounds normal. No respiratory distress.  Musculoskeletal: Normal range of motion.  Lymphadenopathy:    She has no cervical adenopathy.  Neurological: She is alert and oriented to person, place, and time.  Skin: Skin is warm and  dry.  Psychiatric: She has a normal mood and affect. Her behavior is normal.  Nursing note and vitals reviewed.  BP 120/70 mmHg  Pulse 71  Temp(Src) 97.8 F (36.6 C) (Oral)  Resp 16  Ht 5' 2.5" (1.588 m)  Wt 198 lb 9.6 oz (90.084 kg)  BMI 35.72 kg/m2  SpO2 98%       Assessment & Plan:   Diabetes mellitus without complication - Plan: HM Diabetes Foot Exam, POCT glucose (manual entry), POCT glycosylated hemoglobin (Hb A1C), COMPLETE METABOLIC PANEL WITH GFR - resolved - under excellent diet control w/ a1c in PRE-DIABETES range  HYPERCHOLESTEROLEMIA - Plan: Lipid panel - goal LDL <100 so flp today - elevated since stopped lipitor about 3 mos ago - ASCVD risk now at 38.7 off of statin with ideal ASCVD risk of 12.9% but when pt was on lipitor w/ LDL at 77 -  ASCVD risk was still sig elevated at 32.1% so will not re-challenge w/ statin at this time due to dramatic decrease in knee pain and increase in activity tolerance since stopping lipitor.  Very moderate decrease in vascular risk not worth worsening of pain, decreased activity, and exacerbation of diet-controlled pre-DM  Type 2 diabetes mellitus with diabetic chronic kidney disease  Chronic fatigue - Plan: TSH, POCT CBC  Accelerated hypertension - Plan: COMPLETE METABOLIC PANEL WITH GFR, POCT CBC  Acute recurrent ethmoidal sinusitis - amoxicillin  Vertigo - saw ent prev - rec just cont on prn mexlizine - no sig fatigue  Insomnia - stable on xanax qhs for years so pt can't sleep w/o  Knee OA - HUGE improvement since stopping atorvastatin 3 mos ago - was requirng bilateral cortisone injections every 2-3 mos with prn hydrocodone 1-2x/d when on lipitor to provide just minimal relief- now pt states she does not have any pain sig enough to require IA cortisone inj and use of prn hydrocodone sig down - still has 8 left from 90 prescribed 3 mos prior.  Meds ordered this encounter  Medications  . HYDROcodone-acetaminophen (NORCO/VICODIN)  5-325 MG per tablet    Sig: Take 1 tablet by mouth at bedtime as needed for moderate pain.    Dispense:  90 tablet    Refill:  0  . omeprazole (PRILOSEC) 20 MG capsule    Sig: Take 1 capsule (20 mg total) by mouth daily.    Dispense:  30 capsule    Refill:  5  . amoxicillin (AMOXIL) 875 MG tablet    Sig: Take 1 tablet (875 mg total) by mouth 3 (three) times daily.    Dispense:  30 tablet    Refill:  0  . ipratropium (ATROVENT) 0.03 % nasal spray    Sig: Place 2 sprays into the nose 4 (four) times daily.    Dispense:  30 mL  Refill:  1    I personally performed the services described in this documentation, which was scribed in my presence. The recorded information has been reviewed and considered, and addended by me as needed.  [name], MD MPH   Results for orders placed or performed in visit on 03/09/14  COMPLETE METABOLIC PANEL WITH GFR  Result Value Ref Range   Sodium 132 (L) 135 - 145 mEq/L   Potassium 4.0 3.5 - 5.3 mEq/L   Chloride 95 (L) 96 - 112 mEq/L   CO2 27 19 - 32 mEq/L   Glucose, Bld 100 (H) 70 - 99 mg/dL   BUN 7 6 - 23 mg/dL   Creat 5.78 4.69 - 6.29 mg/dL   Total Bilirubin 0.4 0.2 - 1.2 mg/dL   Alkaline Phosphatase 76 39 - 117 U/L   AST 15 0 - 37 U/L   ALT 14 0 - 35 U/L   Total Protein 6.5 6.0 - 8.3 g/dL   Albumin 3.9 3.5 - 5.2 g/dL   Calcium 9.2 8.4 - 52.8 mg/dL   GFR, Est African American >89 mL/min   GFR, Est Non African American 80 mL/min  TSH  Result Value Ref Range   TSH 1.207 0.350 - 4.500 uIU/mL  Lipid panel  Result Value Ref Range   Cholesterol 211 (H) 0 - 200 mg/dL   Triglycerides 55 <413 mg/dL   HDL 73 >24 mg/dL   Total CHOL/HDL Ratio 2.9 Ratio   VLDL 11 0 - 40 mg/dL   LDL Cholesterol 401 (H) 0 - 99 mg/dL  POCT glucose (manual entry)  Result Value Ref Range   POC Glucose 115 (A) 70 - 99 mg/dl  POCT glycosylated hemoglobin (Hb A1C)  Result Value Ref Range   Hemoglobin A1C 6.1   POCT CBC  Result Value Ref Range   WBC 7.3 4.6 - 10.2  K/uL   Lymph, poc 2.4 0.6 - 3.4   POC LYMPH PERCENT 33.5 10 - 50 %L   MID (cbc) 0.5 0 - 0.9   POC MID % 7.4 0 - 12 %M   POC Granulocyte 4.3 2 - 6.9   Granulocyte percent 59.1 37 - 80 %G   RBC 4.04 4.04 - 5.48 M/uL   Hemoglobin 11.3 (A) 12.2 - 16.2 g/dL   HCT, POC 02.7 (A) 25.3 - 47.9 %   MCV 84.3 80 - 97 fL   MCH, POC 27.9 27 - 31.2 pg   MCHC 33.1 31.8 - 35.4 g/dL   RDW, POC 66.4 %   Platelet Count, POC 350 142 - 424 K/uL   MPV 6.1 0 - 99.8 fL

## 2014-03-20 ENCOUNTER — Other Ambulatory Visit: Payer: Self-pay | Admitting: Family Medicine

## 2014-05-07 ENCOUNTER — Ambulatory Visit (INDEPENDENT_AMBULATORY_CARE_PROVIDER_SITE_OTHER): Payer: Medicare Other | Admitting: Podiatry

## 2014-05-07 ENCOUNTER — Encounter: Payer: Self-pay | Admitting: Podiatry

## 2014-05-07 DIAGNOSIS — M79676 Pain in unspecified toe(s): Secondary | ICD-10-CM | POA: Diagnosis not present

## 2014-05-07 DIAGNOSIS — B351 Tinea unguium: Secondary | ICD-10-CM

## 2014-05-07 NOTE — Patient Instructions (Signed)
Diabetes and Foot Care Diabetes may cause you to have problems because of poor blood supply (circulation) to your feet and legs. This may cause the skin on your feet to become thinner, break easier, and heal more slowly. Your skin may become dry, and the skin may peel and crack. You may also have nerve damage in your legs and feet causing decreased feeling in them. You may not notice minor injuries to your feet that could lead to infections or more serious problems. Taking care of your feet is one of the most important things you can do for yourself.  HOME CARE INSTRUCTIONS  Wear shoes at all times, even in the house. Do not go barefoot. Bare feet are easily injured.  Check your feet daily for blisters, cuts, and redness. If you cannot see the bottom of your feet, use a mirror or ask someone for help.  Wash your feet with warm water (do not use hot water) and mild soap. Then pat your feet and the areas between your toes until they are completely dry. Do not soak your feet as this can dry your skin.  Apply a moisturizing lotion or petroleum jelly (that does not contain alcohol and is unscented) to the skin on your feet and to dry, brittle toenails. Do not apply lotion between your toes.  Trim your toenails straight across. Do not dig under them or around the cuticle. File the edges of your nails with an emery board or nail file.  Do not cut corns or calluses or try to remove them with medicine.  Wear clean socks or stockings every day. Make sure they are not too tight. Do not wear knee-high stockings since they may decrease blood flow to your legs.  Wear shoes that fit properly and have enough cushioning. To break in new shoes, wear them for just a few hours a day. This prevents you from injuring your feet. Always look in your shoes before you put them on to be sure there are no objects inside.  Do not cross your legs. This may decrease the blood flow to your feet.  If you find a minor scrape,  cut, or break in the skin on your feet, keep it and the skin around it clean and dry. These areas may be cleansed with mild soap and water. Do not cleanse the area with peroxide, alcohol, or iodine.  When you remove an adhesive bandage, be sure not to damage the skin around it.  If you have a wound, look at it several times a day to make sure it is healing.  Do not use heating pads or hot water bottles. They may burn your skin. If you have lost feeling in your feet or legs, you may not know it is happening until it is too late.  Make sure your health care provider performs a complete foot exam at least annually or more often if you have foot problems. Report any cuts, sores, or bruises to your health care provider immediately. SEEK MEDICAL CARE IF:   You have an injury that is not healing.  You have cuts or breaks in the skin.  You have an ingrown nail.  You notice redness on your legs or feet.  You feel burning or tingling in your legs or feet.  You have pain or cramps in your legs and feet.  Your legs or feet are numb.  Your feet always feel cold. SEEK IMMEDIATE MEDICAL CARE IF:   There is increasing redness,   swelling, or pain in or around a wound.  There is a red line that goes up your leg.  Pus is coming from a wound.  You develop a fever or as directed by your health care provider.  You notice a bad smell coming from an ulcer or wound. Document Released: 01/03/2000 Document Revised: 09/07/2012 Document Reviewed: 06/14/2012 [employer] Patient Information 2015 [employer]. This information is not intended to replace advice given to you by your health care provider. Make sure you discuss any questions you have with your health care provider.  

## 2014-05-08 NOTE — Progress Notes (Signed)
Patient ID: Casey GaussAda B [name], female   DOB: [birth year], 79 y.o.   MRN: [id number]  Subjective: This patient presents again complaining of painful toenails and requests toenail debridement  Objective: The toenails are elongated, hypertrophic, incurvated, discolored and tender to palpation 6-10  Assessment: Symptomatic onychomycoses 6-10 Prediabetic  Plan: Debridement of toenails 10 without any bleeding  Reappoint 3 months

## 2014-05-21 ENCOUNTER — Other Ambulatory Visit: Payer: Self-pay | Admitting: Family Medicine

## 2014-05-22 ENCOUNTER — Other Ambulatory Visit: Payer: Self-pay | Admitting: Family Medicine

## 2014-05-26 NOTE — Telephone Encounter (Signed)
Pt has an appointment sched with me for 5/20 - ok to refill hydrocodone 5 mg #90 - i am not in the office until 5/9 evening so pt can pick up on 5/10 or ask PA to write on my behalf is she needs it before

## 2014-05-29 ENCOUNTER — Other Ambulatory Visit: Payer: Self-pay | Admitting: Family Medicine

## 2014-05-29 MED ORDER — ALPRAZOLAM 1 MG PO TABS: 1.0000 mg | ORAL_TABLET | Freq: Every day | ORAL | Status: DC

## 2014-05-29 MED ORDER — HYDROCODONE-ACETAMINOPHEN 5-325 MG PO TABS: 1.0000 | ORAL_TABLET | Freq: Four times a day (QID) | ORAL | Status: DC | PRN

## 2014-05-30 ENCOUNTER — Telehealth: Payer: Self-pay

## 2014-05-30 NOTE — Telephone Encounter (Signed)
Rx for Hydrocodone and Xanax ready to pick up. Rx's in pick up draw.

## 2014-05-30 NOTE — Telephone Encounter (Signed)
lmom that RX is ready to be picked up 

## 2014-06-08 ENCOUNTER — Encounter: Payer: Self-pay | Admitting: Family Medicine

## 2014-06-08 ENCOUNTER — Ambulatory Visit (INDEPENDENT_AMBULATORY_CARE_PROVIDER_SITE_OTHER): Payer: Medicare Other | Admitting: Family Medicine

## 2014-06-08 ENCOUNTER — Other Ambulatory Visit: Payer: Self-pay | Admitting: Family Medicine

## 2014-06-08 VITALS — BP 158/76 | HR 73 | Temp 98.4°F | Resp 16 | Ht 62.5 in | Wt 193.0 lb

## 2014-06-08 DIAGNOSIS — J0110 Acute frontal sinusitis, unspecified: Secondary | ICD-10-CM

## 2014-06-08 MED ORDER — AMOXICILLIN-POT CLAVULANATE 875-125 MG PO TABS: 1.0000 | ORAL_TABLET | Freq: Two times a day (BID) | ORAL | Status: DC

## 2014-06-08 MED ORDER — ALPRAZOLAM 1 MG PO TABS: 1.0000 mg | ORAL_TABLET | Freq: Every day | ORAL | Status: DC

## 2014-06-08 MED ORDER — IPRATROPIUM BROMIDE 0.03 % NA SOLN: 2.0000 | Freq: Four times a day (QID) | NASAL | Status: DC

## 2014-06-08 MED ORDER — HYDROCODONE-ACETAMINOPHEN 5-325 MG PO TABS: 1.0000 | ORAL_TABLET | Freq: Four times a day (QID) | ORAL | Status: DC | PRN

## 2014-06-08 NOTE — Patient Instructions (Signed)
You should stay away from all cough and cold medications containing decongestant, especially phenylephrine and pseudoephedrine (will be listed under the active ingredient list).  To make it easier, CoricidinHBP is a product tailored towards people with hypertension. Hot showers or breathing in steam may help loosen the congestion.  Using a netti pot or sinus rinse is also likely to help you feel better and keep this from progressing.  Use the atrovent nasal spray as needed throughout the day.  I recommend augmenting with generic mucinex to help you move out the congestion.  If no improvement or you are getting worse, come back as you might need a course of steroids but hopefully with all of the above, you can avoid it.  Sinusitis Sinusitis is redness, soreness, and inflammation of the paranasal sinuses. Paranasal sinuses are air pockets within the bones of your face (beneath the eyes, the middle of the forehead, or above the eyes). In healthy paranasal sinuses, mucus is able to drain out, and air is able to circulate through them by way of your nose. However, when your paranasal sinuses are inflamed, mucus and air can become trapped. This can allow bacteria and other germs to grow and cause infection. Sinusitis can develop quickly and last only a short time (acute) or continue over a long period (chronic). Sinusitis that lasts for more than 12 weeks is considered chronic.  CAUSES  Causes of sinusitis include:  Allergies.  Structural abnormalities, such as displacement of the cartilage that separates your nostrils (deviated septum), which can decrease the air flow through your nose and sinuses and affect sinus drainage.  Functional abnormalities, such as when the small hairs (cilia) that line your sinuses and help remove mucus do not work properly or are not present. SIGNS AND SYMPTOMS  Symptoms of acute and chronic sinusitis are the same. The primary symptoms are pain and pressure around the affected  sinuses. Other symptoms include:  Upper toothache.  Earache.  Headache.  Bad breath.  Decreased sense of smell and taste.  A cough, which worsens when you are lying flat.  Fatigue.  Fever.  Thick drainage from your nose, which often is green and may contain pus (purulent).  Swelling and warmth over the affected sinuses. DIAGNOSIS  Your health care provider will perform a physical exam. During the exam, your health care provider may:  Look in your nose for signs of abnormal growths in your nostrils (nasal polyps).  Tap over the affected sinus to check for signs of infection.  View the inside of your sinuses (endoscopy) using an imaging device that has a light attached (endoscope). If your health care provider suspects that you have chronic sinusitis, one or more of the following tests may be recommended:  Allergy tests.  Nasal culture. A sample of mucus is taken from your nose, sent to a lab, and screened for bacteria.  Nasal cytology. A sample of mucus is taken from your nose and examined by your health care provider to determine if your sinusitis is related to an allergy. TREATMENT  Most cases of acute sinusitis are related to a viral infection and will resolve on their own within 10 days. Sometimes medicines are prescribed to help relieve symptoms (pain medicine, decongestants, nasal steroid sprays, or saline sprays).  However, for sinusitis related to a bacterial infection, your health care provider will prescribe antibiotic medicines. These are medicines that will help kill the bacteria causing the infection.  Rarely, sinusitis is caused by a fungal infection. In theses  cases, your health care provider will prescribe antifungal medicine. For some cases of chronic sinusitis, surgery is needed. Generally, these are cases in which sinusitis recurs more than 3 times per year, despite other treatments. HOME CARE INSTRUCTIONS   Drink plenty of water. Water helps thin the  mucus so your sinuses can drain more easily.  Use a humidifier.  Inhale steam 3 to 4 times a day (for example, sit in the bathroom with the shower running).  Apply a warm, moist washcloth to your face 3 to 4 times a day, or as directed by your health care provider.  Use saline nasal sprays to help moisten and clean your sinuses.  Take medicines only as directed by your health care provider.  If you were prescribed either an antibiotic or antifungal medicine, finish it all even if you start to feel better. SEEK IMMEDIATE MEDICAL CARE IF:  You have increasing pain or severe headaches.  You have nausea, vomiting, or drowsiness.  You have swelling around your face.  You have vision problems.  You have a stiff neck.  You have difficulty breathing. MAKE SURE YOU:   Understand these instructions.  Will watch your condition.  Will get help right away if you are not doing well or get worse. Document Released: 01/05/2005 Document Revised: 05/22/2013 Document Reviewed: 01/20/2011 [facility] Patient Information 2015 [place], [place]. This information is not intended to replace advice given to you by your health care provider. Make sure you discuss any questions you have with your health care provider. DASH Eating Plan DASH stands for "Dietary Approaches to Stop Hypertension." The DASH eating plan is a healthy eating plan that has been shown to reduce high blood pressure (hypertension). Additional health benefits may include reducing the risk of type 2 diabetes mellitus, heart disease, and stroke. The DASH eating plan may also help with weight loss. WHAT DO I NEED TO KNOW ABOUT THE DASH EATING PLAN? For the DASH eating plan, you will follow these general guidelines:  Choose foods with a percent daily value for sodium of less than 5% (as listed on the food label).  Use salt-free seasonings or herbs instead of table salt or sea salt.  Check with your health care provider or pharmacist  before using salt substitutes.  Eat lower-sodium products, often labeled as "lower sodium" or "no salt added."  Eat fresh foods.  Eat more vegetables, fruits, and low-fat dairy products.  Choose whole grains. Look for the word "whole" as the first word in the ingredient list.  Choose fish and skinless chicken or [place]key more often than red meat. Limit fish, poultry, and meat to 6 oz (170 g) each day.  Limit sweets, desserts, sugars, and sugary drinks.  Choose heart-healthy fats.  Limit cheese to 1 oz (28 g) per day.  Eat more home-cooked food and less restaurant, buffet, and fast food.  Limit fried foods.  Cook foods using methods other than frying.  Limit canned vegetables. If you do use them, rinse them well to decrease the sodium.  When eating at a restaurant, ask that your food be prepared with less salt, or no salt if possible. WHAT FOODS CAN I EAT? Seek help from a dietitian for individual calorie needs. Grains Whole grain or whole wheat bread. Brown rice. Whole grain or whole wheat pasta. Quinoa, bulgur, and whole grain cereals. Low-sodium cereals. Corn or whole wheat flour tortillas. Whole grain cornbread. Whole grain crackers. Low-sodium crackers. Vegetables Fresh or frozen vegetables (raw, steamed, roasted, or grilled). Low-sodium  or reduced-sodium tomato and vegetable juices. Low-sodium or reduced-sodium tomato sauce and paste. Low-sodium or reduced-sodium canned vegetables.  Fruits All fresh, canned (in natural juice), or frozen fruits. Meat and Other Protein Products Ground beef (85% or leaner), grass-fed beef, or beef trimmed of fat. Skinless chicken or [place]key. Ground chicken or [place]key. Pork trimmed of fat. All fish and seafood. Eggs. Dried beans, peas, or lentils. Unsalted nuts and seeds. Unsalted canned beans. Dairy Low-fat dairy products, such as skim or 1% milk, 2% or reduced-fat cheeses, low-fat ricotta or cottage cheese, or plain low-fat yogurt. Low-sodium or  reduced-sodium cheeses. Fats and Oils Tub margarines without trans fats. Light or reduced-fat mayonnaise and salad dressings (reduced sodium). Avocado. Safflower, olive, or canola oils. Natural peanut or almond butter. Other Unsalted popcorn and pretzels. The items listed above may not be a complete list of recommended foods or beverages. Contact your dietitian for more options. WHAT FOODS ARE NOT RECOMMENDED? Grains White bread. White pasta. White rice. Refined cornbread. Bagels and croissants. Crackers that contain trans fat. Vegetables Creamed or fried vegetables. Vegetables in a cheese sauce. Regular canned vegetables. Regular canned tomato sauce and paste. Regular tomato and vegetable juices. Fruits Dried fruits. Canned fruit in light or heavy syrup. Fruit juice. Meat and Other Protein Products Fatty cuts of meat. Ribs, chicken wings, bacon, sausage, bologna, salami, chitterlings, fatback, hot dogs, bratwurst, and packaged luncheon meats. Salted nuts and seeds. Canned beans with salt. Dairy Whole or 2% milk, cream, half-and-half, and cream cheese. Whole-fat or sweetened yogurt. Full-fat cheeses or blue cheese. Nondairy creamers and whipped toppings. Processed cheese, cheese spreads, or cheese curds. Condiments Onion and garlic salt, seasoned salt, table salt, and sea salt. Canned and packaged gravies. Worcestershire sauce. Tartar sauce. Barbecue sauce. Teriyaki sauce. Soy sauce, including reduced sodium. Steak sauce. Fish sauce. Oyster sauce. Cocktail sauce. Horseradish. Ketchup and mustard. Meat flavorings and tenderizers. Bouillon cubes. Hot sauce. Tabasco sauce. Marinades. Taco seasonings. Relishes. Fats and Oils Butter, stick margarine, lard, shortening, ghee, and bacon fat. Coconut, palm kernel, or palm oils. Regular salad dressings. Other Pickles and olives. Salted popcorn and pretzels. The items listed above may not be a complete list of foods and beverages to avoid. Contact your  dietitian for more information. WHERE CAN I FIND MORE INFORMATION? National Heart, Lung, and Blood Institute: CablePromo.itwww.nhlbi.nih.gov/health/health-topics/topics/dash/ Document Released: 12/25/2010 Document Revised: 05/22/2013 Document Reviewed: 11/09/2012 [facility]ExitCare Patient Information 2015 [place]ExitCare, [place]LL[place]. This information is not intended to replace advice given to you by your health care provider. Make sure you discuss any questions you have with your health care provider.

## 2014-06-08 NOTE — Progress Notes (Signed)
Subjective:    Patient ID: Casey [name], female    DOB: 1936-01-05, 79 y.o.   MRN: [id number] This chart was scribed for Sherren Mocha, MD by Leona Carry, ED Scribe. The patient's care was started at 10:04 AM.    Chief Complaint  Patient presents with  . Follow-up    DIABETES  . Medication Refill    Atrovent nasal spray    HPI Casey [name] is a 79 y.o. female who has had a sharp decrease in chronic knee pain in the past 6 months. This pain was previously attributed to osteoarthritis and treated with interarticular cortisone injections every 3 months bilaterally and opiates. This pain has almost completely resolved after stopping her statin cholesterol medication.   Allergies: Patient complains of feeling generally sick beginning two weeks ago. She reports congestion, sinus pain, ear pain and neck soreness. She believes these symptoms to be associated with seasonal allergies.  Diabetes: Patient has a history of DM. She reports that her blood sugar has been under control. She states that her levels have been steady at approximately 130 during the daytime. Patient denies any dramatic drops in blood sugar levels recently.    OTC Medications: Patient is using Allegra OTC for her allergies with no relief of symptoms.    Prescription Medications: She has a 4 month supply of TID Neurontin. Plenty of Alprazolam, Omeprazole, Lexapro, meclizine (Antivert), and Maxzide QD (with potassium).    Past Medical History  Diagnosis Date  . HYPERTENSION, BENIGN ESSENTIAL 03/03/2007  . HYPERCHOLESTEROLEMIA   . SPRAIN&STRAIN OTH SPEC SITES SHOULDER&UPPER ARM   . SINUSITIS, RECURRENT   . Problems with swallowing and mastication   . OSTEOPOROSIS   . OSTEOARTHRITIS, KNEE   . OBESITY   . Microscopic hematuria   . LEG EDEMA, BILATERAL   . HYPERGLYCEMIA   . HIP PAIN, LEFT   . Headache(784.0)   . FLANK PAIN, LEFT   . FATIGUE   . ESOPHAGEAL STRICTURE   . CYSTOCELE WITH INCOMPLETE UTERINE  PROLAPSE   . CONSTIPATION   . BENIGN POSITIONAL VERTIGO   . ARM PAIN, RIGHT   . ANXIETY   . ALLERGIC RHINITIS   . Carcinoma in situ of colon   . Peripheral neuropathy, idiopathic    Current Outpatient Prescriptions on File Prior to Visit  Medication Sig Dispense Refill  . ALPRAZolam (XANAX) 1 MG tablet Take 1 tablet (1 mg total) by mouth at bedtime. 30 tablet 0  . cholecalciferol (VITAMIN D) 1000 UNITS tablet Take 1,000 Units by mouth daily.    . cyanocobalamin 500 MCG tablet Take 500 mcg by mouth daily.     Marland Kitchen escitalopram (LEXAPRO) 10 MG tablet Take 1 tablet (10 mg total) by mouth daily. 30 tablet 11  . gabapentin (NEURONTIN) 300 MG capsule take 1 capsule by mouth three times a day 90 capsule 3  . HYDROcodone-acetaminophen (NORCO/VICODIN) 5-325 MG per tablet Take 1 tablet by mouth every 6 (six) hours as needed for moderate pain. 90 tablet 0  . meclizine (ANTIVERT) 25 MG tablet take 1 tablet by mouth UP TO 3 TIMES A DAY FOR dizziness 90 tablet 5  . omeprazole (PRILOSEC) 20 MG capsule Take 1 capsule (20 mg total) by mouth daily. 30 capsule 5  . potassium chloride SA (K-DUR,KLOR-CON) 20 MEQ tablet take 1 tablet by mouth once daily 30 tablet 8  . triamterene-hydrochlorothiazide (MAXZIDE-25) 37.5-25 MG per tablet Take 1 tablet by mouth daily. 30 tablet 11  . Glucose  Blood (BLOOD GLUCOSE TEST STRIPS) STRP Test glucose once daily DX 250.00 50 each 11  . ipratropium (ATROVENT) 0.03 % nasal spray Place 2 sprays into the nose 4 (four) times daily. (Patient not taking: Reported on 06/08/2014) 30 mL 1   No current facility-administered medications on file prior to visit.    No Known Allergies   Review of Systems  Constitutional: Positive for chills, diaphoresis, appetite change and fatigue. Negative for fever, activity change and unexpected weight change.  HENT: Positive for congestion, ear pain, postnasal drip, rhinorrhea and sinus pressure. Negative for trouble swallowing.   Gastrointestinal:  Negative for vomiting and abdominal pain.  Endocrine: Negative for polydipsia and polyphagia.  Musculoskeletal: Positive for myalgias, back pain, joint swelling, arthralgias, gait problem and neck pain. Negative for neck stiffness.  Allergic/Immunologic: Positive for environmental allergies.  Neurological: Negative for dizziness, syncope, speech difficulty, light-headedness and numbness.  Psychiatric/Behavioral: Positive for sleep disturbance.       Objective:   Physical Exam  Constitutional: She is oriented to person, place, and time. She appears well-developed and well-nourished. No distress.  HENT:  Head: Normocephalic and atraumatic.  Right Ear: Tympanic membrane normal.  Left Ear: Tympanic membrane normal.  Mouth/Throat: Oropharynx is clear and moist.  Nasal mucosa with severe edema. Pallor. Dry.  Eyes: Conjunctivae and EOM are normal.  Neck: Neck supple. No tracheal deviation present.  Cardiovascular: Normal rate.   Murmur heard. Irregularly irregular. 3/6 systolic murmur in left upper sternal border. S1 normal, but not S2.   Pulmonary/Chest: Effort normal and breath sounds normal. No respiratory distress. She has no wheezes. She has no rales.  Musculoskeletal: Normal range of motion.  Neurological: She is alert and oriented to person, place, and time.  Skin: Skin is warm and dry.  Sebaceous cyst, 5 mm on let shoulder.. Hyperpigmented and non-inflamed.   Psychiatric: She has a normal mood and affect. Her behavior is normal.  Nursing note and vitals reviewed.  BP 158/76 mmHg  Pulse 73  Temp(Src) 98.4 F (36.9 C) (Oral)  Resp 16  Ht 5' 2.5" (1.588 m)  Wt 193 lb (87.544 kg)  BMI 34.72 kg/m2  SpO2 98%     Assessment & Plan:  No need to further monitor patient's lipid levels, as she will not restart on cholesterol medication. We do need to check her potassium levels at next OV. 1. Acute frontal sinusitis, recurrence not specified     Meds ordered this encounter    Medications  . ALPRAZolam (XANAX) 1 MG tablet    Sig: Take 1 tablet (1 mg total) by mouth at bedtime.    Dispense:  30 tablet    Refill:  5  . ipratropium (ATROVENT) 0.03 % nasal spray    Sig: Place 2 sprays into the nose 4 (four) times daily.    Dispense:  30 mL    Refill:  1  . HYDROcodone-acetaminophen (NORCO/VICODIN) 5-325 MG per tablet    Sig: Take 1 tablet by mouth every 6 (six) hours as needed for moderate pain.    Dispense:  120 tablet    Refill:  0  . amoxicillin-clavulanate (AUGMENTIN) 875-125 MG per tablet    Sig: Take 1 tablet by mouth 2 (two) times daily.    Dispense:  20 tablet    Refill:  0    I personally performed the services described in this documentation, which was scribed in my presence. The recorded information has been reviewed and considered, and addended by me as needed.  [name], MD MPH

## 2014-06-09 NOTE — Telephone Encounter (Signed)
This was phoned in to pharmacy by myself today before I received this so unsure if pharmacy did not get it - will double check.

## 2014-08-20 ENCOUNTER — Ambulatory Visit: Payer: Medicare Other | Admitting: Podiatry

## 2014-10-11 ENCOUNTER — Encounter: Payer: Self-pay | Admitting: Family Medicine

## 2014-10-11 ENCOUNTER — Ambulatory Visit (INDEPENDENT_AMBULATORY_CARE_PROVIDER_SITE_OTHER): Payer: Medicare Other | Admitting: Family Medicine

## 2014-10-11 VITALS — BP 142/78 | HR 65 | Temp 97.5°F | Resp 16 | Wt 195.6 lb

## 2014-10-11 DIAGNOSIS — E559 Vitamin D deficiency, unspecified: Secondary | ICD-10-CM

## 2014-10-11 DIAGNOSIS — M170 Bilateral primary osteoarthritis of knee: Secondary | ICD-10-CM | POA: Diagnosis not present

## 2014-10-11 DIAGNOSIS — I4581 Long QT syndrome: Secondary | ICD-10-CM | POA: Diagnosis not present

## 2014-10-11 DIAGNOSIS — I10 Essential (primary) hypertension: Secondary | ICD-10-CM | POA: Diagnosis not present

## 2014-10-11 DIAGNOSIS — M810 Age-related osteoporosis without current pathological fracture: Secondary | ICD-10-CM | POA: Diagnosis not present

## 2014-10-11 DIAGNOSIS — R609 Edema, unspecified: Secondary | ICD-10-CM

## 2014-10-11 DIAGNOSIS — D649 Anemia, unspecified: Secondary | ICD-10-CM

## 2014-10-11 DIAGNOSIS — E871 Hypo-osmolality and hyponatremia: Secondary | ICD-10-CM

## 2014-10-11 DIAGNOSIS — E669 Obesity, unspecified: Secondary | ICD-10-CM | POA: Diagnosis not present

## 2014-10-11 DIAGNOSIS — R7309 Other abnormal glucose: Secondary | ICD-10-CM

## 2014-10-11 DIAGNOSIS — R5383 Other fatigue: Secondary | ICD-10-CM

## 2014-10-11 DIAGNOSIS — Z5181 Encounter for therapeutic drug level monitoring: Secondary | ICD-10-CM | POA: Diagnosis not present

## 2014-10-11 DIAGNOSIS — E7800 Pure hypercholesterolemia, unspecified: Secondary | ICD-10-CM

## 2014-10-11 DIAGNOSIS — R7303 Prediabetes: Secondary | ICD-10-CM

## 2014-10-11 LAB — POCT URINALYSIS DIP (MANUAL ENTRY)
Bilirubin, UA: NEGATIVE
Blood, UA: NEGATIVE
Glucose, UA: NEGATIVE
Ketones, POC UA: NEGATIVE
LEUKOCYTES UA: NEGATIVE
NITRITE UA: NEGATIVE
PH UA: 8.5
PROTEIN UA: NEGATIVE
Spec Grav, UA: 1.015
Urobilinogen, UA: 0.2

## 2014-10-11 LAB — COMPREHENSIVE METABOLIC PANEL
ALBUMIN: 4 g/dL (ref 3.6–5.1)
ALT: 9 U/L (ref 6–29)
AST: 15 U/L (ref 10–35)
Alkaline Phosphatase: 78 U/L (ref 33–130)
BILIRUBIN TOTAL: 0.5 mg/dL (ref 0.2–1.2)
BUN: 8 mg/dL (ref 7–25)
CALCIUM: 9.5 mg/dL (ref 8.6–10.4)
CHLORIDE: 99 mmol/L (ref 98–110)
CO2: 28 mmol/L (ref 20–31)
Creat: 0.84 mg/dL (ref 0.60–0.93)
Glucose, Bld: 99 mg/dL (ref 65–99)
Potassium: 3.7 mmol/L (ref 3.5–5.3)
Sodium: 139 mmol/L (ref 135–146)
Total Protein: 6.8 g/dL (ref 6.1–8.1)

## 2014-10-11 LAB — CBC
HCT: 34.4 % — ABNORMAL LOW (ref 36.0–46.0)
Hemoglobin: 11.5 g/dL — ABNORMAL LOW (ref 12.0–15.0)
MCH: 27.3 pg (ref 26.0–34.0)
MCHC: 33.4 g/dL (ref 30.0–36.0)
MCV: 81.7 fL (ref 78.0–100.0)
MPV: 9.7 fL (ref 8.6–12.4)
PLATELETS: 251 10*3/uL (ref 150–400)
RBC: 4.21 MIL/uL (ref 3.87–5.11)
RDW: 15.6 % — ABNORMAL HIGH (ref 11.5–15.5)
WBC: 7.2 10*3/uL (ref 4.0–10.5)

## 2014-10-11 LAB — POCT GLYCOSYLATED HEMOGLOBIN (HGB A1C): Hemoglobin A1C: 6.2

## 2014-10-11 LAB — MICROALBUMIN / CREATININE URINE RATIO
Creatinine, Urine: 75.8 mg/dL
MICROALB/CREAT RATIO: 9.2 mg/g (ref 0.0–30.0)
Microalb, Ur: 0.7 mg/dL (ref <2.0)

## 2014-10-11 LAB — LIPID PANEL
Cholesterol: 257 mg/dL — ABNORMAL HIGH (ref 125–200)
HDL: 71 mg/dL (ref >=46)
LDL Cholesterol: 170 mg/dL — ABNORMAL HIGH (ref <130)
TRIGLYCERIDES: 78 mg/dL (ref <150)
Total CHOL/HDL Ratio: 3.6 Ratio (ref <=5.0)
VLDL: 16 mg/dL (ref <30)

## 2014-10-11 MED ORDER — HYDROCODONE-ACETAMINOPHEN 5-325 MG PO TABS: 1.0000 | ORAL_TABLET | Freq: Four times a day (QID) | ORAL | Status: DC | PRN

## 2014-10-11 MED ORDER — IPRATROPIUM BROMIDE 0.03 % NA SOLN: 2.0000 | Freq: Four times a day (QID) | NASAL | Status: DC

## 2014-10-11 MED ORDER — MECLIZINE HCL 25 MG PO TABS: ORAL_TABLET | ORAL | Status: DC

## 2014-10-11 MED ORDER — GABAPENTIN 300 MG PO CAPS: 300.0000 mg | ORAL_CAPSULE | Freq: Three times a day (TID) | ORAL | Status: DC

## 2014-10-11 NOTE — Progress Notes (Signed)
Subjective:    Patient ID: Casey [name], female    DOB: Feb 01, 1935, 79 y.o.   MRN: [id number] Chief Complaint  Patient presents with  . Hypertension  . bloodwork    Diabetes Pertinent negatives for hypoglycemia include no confusion. Associated symptoms include fatigue and weakness. Pertinent negatives for diabetes include no chest pain, no polydipsia, no polyphagia and no polyuria.   HPI Comments: Casey [name] is a 79 y.o. female who presents today complaining of congestion, bilateral post-oricular tenderness, and a headache in the ethmoidal sinus.  Patient states that she has experienced progressively worsening URI symptoms for the past two weeks. She states that her sugar levels have been stable, in the 80-90 range. She report mild depression.  Patient states that her vertigo symptoms are unchanged.     Patient reports one episode of a hot flash, but states that it has gotten better since menopause.    Patient is currently taking Allegra-D for her allergies but states that it does not provide relief of symptoms.  She also takes Xanax, which she reports helps her sleep.    Pt lives in [place] alone - has 1 son who is out of state (poss [place]?)  Past Medical History  Diagnosis Date  . HYPERTENSION, BENIGN ESSENTIAL 03/03/2007  . HYPERCHOLESTEROLEMIA   . SPRAIN&STRAIN OTH SPEC SITES SHOULDER&UPPER ARM   . SINUSITIS, RECURRENT   . Problems with swallowing and mastication   . OSTEOPOROSIS   . OSTEOARTHRITIS, KNEE   . OBESITY   . Microscopic hematuria   . LEG EDEMA, BILATERAL   . HYPERGLYCEMIA   . HIP PAIN, LEFT   . Headache(784.0)   . FLANK PAIN, LEFT   . FATIGUE   . ESOPHAGEAL STRICTURE   . CYSTOCELE WITH INCOMPLETE UTERINE PROLAPSE   . CONSTIPATION   . BENIGN POSITIONAL VERTIGO   . ARM PAIN, RIGHT   . ANXIETY   . ALLERGIC RHINITIS   . Carcinoma in situ of colon   . Peripheral neuropathy, idiopathic    Current Outpatient Prescriptions on File Prior to Visit  Medication  Sig Dispense Refill  . ALPRAZolam (XANAX) 1 MG tablet Take 1 tablet (1 mg total) by mouth at bedtime. 30 tablet 5  . escitalopram (LEXAPRO) 10 MG tablet Take 1 tablet (10 mg total) by mouth daily. 30 tablet 11  . Glucose Blood (BLOOD GLUCOSE TEST STRIPS) STRP Test glucose once daily DX 250.00 50 each 11  . omeprazole (PRILOSEC) 20 MG capsule Take 1 capsule (20 mg total) by mouth daily. 30 capsule 5  . potassium chloride SA (K-DUR,KLOR-CON) 20 MEQ tablet take 1 tablet by mouth once daily 30 tablet 8  . triamterene-hydrochlorothiazide (MAXZIDE-25) 37.5-25 MG per tablet Take 1 tablet by mouth daily. 30 tablet 11  . cholecalciferol (VITAMIN D) 1000 UNITS tablet Take 1,000 Units by mouth daily.    . cyanocobalamin 500 MCG tablet Take 500 mcg by mouth daily.      No current facility-administered medications on file prior to visit.   Not on File   Depression screen [facility] 2/9 10/11/2014 06/08/2014 03/09/2014 04/04/2013  Decreased Interest 0 0 0 0  Down, Depressed, Hopeless 0 0 0 3  PHQ - 2 Score 0 0 0 3  Altered sleeping - - - 0  Tired, decreased energy - - - 3  Change in appetite - - - 0  Feeling bad or failure about yourself  - - - 0  Trouble concentrating - - - 0  Moving  slowly or fidgety/restless - - - 0  Suicidal thoughts - - - 0  PHQ-9 Score - - - 6    Review of Systems  Constitutional: Positive for fatigue. Negative for fever, chills, diaphoresis, activity change, appetite change and unexpected weight change.  Eyes: Negative for visual disturbance.  Respiratory: Negative for cough and shortness of breath.   Cardiovascular: Positive for leg swelling. Negative for chest pain and palpitations.  Endocrine: Negative for polydipsia, polyphagia and polyuria.  Genitourinary: Negative for dysuria, frequency, decreased urine volume and difficulty urinating.  Musculoskeletal: Positive for myalgias, back pain, joint swelling and arthralgias. Negative for gait problem.  Neurological: Positive for  weakness. Negative for syncope.  Hematological: Does not bruise/bleed easily.  Psychiatric/Behavioral: Positive for sleep disturbance. Negative for behavioral problems, confusion, dysphoric mood and agitation.       Objective:   Physical Exam  Constitutional: She is oriented to person, place, and time. She appears well-developed and well-nourished. No distress.  HENT:  Head: Normocephalic and atraumatic.  Right Ear: External ear normal.  Left Ear: External ear normal.  Eyes: Conjunctivae are normal. No scleral icterus.  Neck: Normal range of motion. Neck supple. No thyromegaly present.  Cardiovascular: Normal rate, regular rhythm, normal heart sounds and intact distal pulses.   Pulmonary/Chest: Effort normal and breath sounds normal. No respiratory distress.  Musculoskeletal: She exhibits no edema.  Lymphadenopathy:    She has no cervical adenopathy.  Neurological: She is alert and oriented to person, place, and time.  Skin: Skin is warm and dry. She is not diaphoretic. No erythema.  Psychiatric: She has a normal mood and affect. Her behavior is normal.   BP 142/78 mmHg  Pulse 65  Temp(Src) 97.5 F (36.4 C) (Oral)  Resp 16  Wt 195 lb 9.6 oz (88.724 kg)        Assessment & Plan:    1. Prediabetes - under excellent diet control w/ a1c in PRE-DIABETES range  2. HYPERTENSION, BENIGN ESSENTIAL   3. Long Q-T syndrome   4. Osteoporosis   5. Primary osteoarthritis of both knees - HUGE improvement since stopping atorvastatin- was requirng bilateral cortisone injections every 2-3 mos with prn hydrocodone 1-2x/d when on lipitor to provide just minimal relief- now pt states she does not have any pain sig enough to require IA cortisone inj and use of prn hydrocodone sig down - still has 8 left from 90 prescribed 3 mos prior.  6. HYPERCHOLESTEROLEMIA -- elevated since stopped lipitor about 6 mos ago - ASCVD risk now at 38.7 off of statin with ideal ASCVD risk of 12.9% but when pt was on  lipitor w/ LDL at 77 -  ASCVD risk was still sig elevated at 32.1% so will not re-challenge w/ statin at this time due to dramatic decrease in knee pain and increase in activity tolerance since stopping lipitor.  Very moderate decrease in vascular risk not worth worsening of pain, decreased activity, and exacerbation of diet-controlled pre-DM  7. Obesity   8. LEG EDEMA, BILATERAL   9. Other fatigue   10. Vitamin D deficiency disease   11. Hyponatremia   12. Anemia, unspecified anemia type   13. Encounter for medication monitoring - ok to refill any of her meds x 1 yr when pharmacy submits request.  Vertigo - saw ent prev - rec just cont on prn mexlizine - no sig fatigue  Insomnia - stable on xanax qhs for years so pt can't sleep w/o  Orders Placed This Encounter  Procedures  .  Comprehensive metabolic panel    Order Specific Question:  Has the patient fasted?    Answer:  Yes  . Lipid panel    Order Specific Question:  Has the patient fasted?    Answer:  Yes  . Microalbumin/Creatinine Ratio, Urine  . CBC  . POCT glycosylated hemoglobin (Hb A1C)  . POCT urinalysis dipstick    Meds ordered this encounter  Medications  . HYDROcodone-acetaminophen (NORCO/VICODIN) 5-325 MG per tablet    Sig: Take 1 tablet by mouth every 6 (six) hours as needed for moderate pain.    Dispense:  120 tablet    Refill:  0  . ipratropium (ATROVENT) 0.03 % nasal spray    Sig: Place 2 sprays into the nose 4 (four) times daily. For congestion    Dispense:  30 mL    Refill:  5  . gabapentin (NEURONTIN) 300 MG capsule    Sig: Take 1 capsule (300 mg total) by mouth 3 (three) times daily. For nerve pain, burning feet    Dispense:  90 capsule    Refill:  3  . meclizine (ANTIVERT) 25 MG tablet    Sig: take 1 tablet by mouth UP TO 3 TIMES A DAY FOR dizziness    Dispense:  90 tablet    Refill:  5   [name], MD MPH   Results for orders placed or performed in visit on 03/09/14  COMPLETE METABOLIC PANEL WITH  GFR  Result Value Ref Range   Sodium 132 (L) 135 - 145 mEq/L   Potassium 4.0 3.5 - 5.3 mEq/L   Chloride 95 (L) 96 - 112 mEq/L   CO2 27 19 - 32 mEq/L   Glucose, Bld 100 (H) 70 - 99 mg/dL   BUN 7 6 - 23 mg/dL   Creat 0.45 4.09 - 8.11 mg/dL   Total Bilirubin 0.4 0.2 - 1.2 mg/dL   Alkaline Phosphatase 76 39 - 117 U/L   AST 15 0 - 37 U/L   ALT 14 0 - 35 U/L   Total Protein 6.5 6.0 - 8.3 g/dL   Albumin 3.9 3.5 - 5.2 g/dL   Calcium 9.2 8.4 - 91.4 mg/dL   GFR, Est African American >89 mL/min   GFR, Est Non African American 80 mL/min  TSH  Result Value Ref Range   TSH 1.207 0.350 - 4.500 uIU/mL  Lipid panel  Result Value Ref Range   Cholesterol 211 (H) 0 - 200 mg/dL   Triglycerides 55 <782 mg/dL   HDL 73 >95 mg/dL   Total CHOL/HDL Ratio 2.9 Ratio   VLDL 11 0 - 40 mg/dL   LDL Cholesterol 621 (H) 0 - 99 mg/dL  POCT glucose (manual entry)  Result Value Ref Range   POC Glucose 115 (A) 70 - 99 mg/dl  POCT glycosylated hemoglobin (Hb A1C)  Result Value Ref Range   Hemoglobin A1C 6.1   POCT CBC  Result Value Ref Range   WBC 7.3 4.6 - 10.2 K/uL   Lymph, poc 2.4 0.6 - 3.4   POC LYMPH PERCENT 33.5 10 - 50 %L   MID (cbc) 0.5 0 - 0.9   POC MID % 7.4 0 - 12 %M   POC Granulocyte 4.3 2 - 6.9   Granulocyte percent 59.1 37 - 80 %G   RBC 4.04 4.04 - 5.48 M/uL   Hemoglobin 11.3 (A) 12.2 - 16.2 g/dL   HCT, POC 30.8 (A) 65.7 - 47.9 %   MCV 84.3 80 - 97 fL  MCH, POC 27.9 27 - 31.2 pg   MCHC 33.1 31.8 - 35.4 g/dL   RDW, POC 16.1 %   Platelet Count, POC 350 142 - 424 K/uL   MPV 6.1 0 - 99.8 fL

## 2014-10-12 ENCOUNTER — Ambulatory Visit: Payer: Medicare Other | Admitting: Family Medicine

## 2014-10-23 ENCOUNTER — Encounter: Payer: Self-pay | Admitting: Family Medicine

## 2014-11-25 ENCOUNTER — Other Ambulatory Visit: Payer: Self-pay | Admitting: Family Medicine

## 2014-12-28 ENCOUNTER — Telehealth: Payer: Self-pay

## 2014-12-28 MED ORDER — BLOOD GLUCOSE TEST VI STRP: ORAL_STRIP | Status: DC

## 2014-12-28 MED ORDER — ALPRAZOLAM 1 MG PO TABS: 1.0000 mg | ORAL_TABLET | Freq: Every day | ORAL | Status: DC

## 2014-12-28 NOTE — Telephone Encounter (Signed)
Pt would like a refill on her Glucose Blood (BLOOD GLUCOSE TEST STRIPS) STRP [[id number]][[id number]] and ALPRAZolam (XANAX) 1 MG tablet [[id number]][[id number]]. Pharmacy is good. CB # [phone]

## 2014-12-28 NOTE — Telephone Encounter (Signed)
Rx called in. Pt notified. 

## 2014-12-28 NOTE — Telephone Encounter (Signed)
Refill completed for both - please phone in the alprazolam #30 with 5 refills to pt's pharmacy and let her know.

## 2015-01-14 ENCOUNTER — Telehealth: Payer: Self-pay | Admitting: Family Medicine

## 2015-01-14 NOTE — Telephone Encounter (Signed)
lmom to call and reschedule her cpe with Clelia CroftShaw that was on 02-14-15

## 2015-02-06 ENCOUNTER — Ambulatory Visit (INDEPENDENT_AMBULATORY_CARE_PROVIDER_SITE_OTHER): Payer: Medicare Other | Admitting: Family Medicine

## 2015-02-06 ENCOUNTER — Encounter: Payer: Self-pay | Admitting: Family Medicine

## 2015-02-06 VITALS — BP 155/82 | HR 75 | Temp 98.1°F | Resp 16 | Ht 62.25 in | Wt 204.3 lb

## 2015-02-06 DIAGNOSIS — R5382 Chronic fatigue, unspecified: Secondary | ICD-10-CM | POA: Diagnosis not present

## 2015-02-06 DIAGNOSIS — M170 Bilateral primary osteoarthritis of knee: Secondary | ICD-10-CM | POA: Diagnosis not present

## 2015-02-06 DIAGNOSIS — K219 Gastro-esophageal reflux disease without esophagitis: Secondary | ICD-10-CM | POA: Diagnosis not present

## 2015-02-06 DIAGNOSIS — I10 Essential (primary) hypertension: Secondary | ICD-10-CM

## 2015-02-06 DIAGNOSIS — D649 Anemia, unspecified: Secondary | ICD-10-CM

## 2015-02-06 DIAGNOSIS — Z0000 Encounter for general adult medical examination without abnormal findings: Secondary | ICD-10-CM

## 2015-02-06 LAB — CBC
HCT: 36.7 % (ref 36.0–46.0)
Hemoglobin: 12.2 g/dL (ref 12.0–15.0)
MCH: 28.4 pg (ref 26.0–34.0)
MCHC: 33.2 g/dL (ref 30.0–36.0)
MCV: 85.3 fL (ref 78.0–100.0)
MPV: 9 fL (ref 8.6–12.4)
Platelets: 257 10*3/uL (ref 150–400)
RBC: 4.3 MIL/uL (ref 3.87–5.11)
RDW: 15.2 % (ref 11.5–15.5)
WBC: 6.5 10*3/uL (ref 4.0–10.5)

## 2015-02-06 LAB — BASIC METABOLIC PANEL
BUN: 11 mg/dL (ref 7–25)
CALCIUM: 9.7 mg/dL (ref 8.6–10.4)
CHLORIDE: 97 mmol/L — AB (ref 98–110)
CO2: 27 mmol/L (ref 20–31)
CREATININE: 0.94 mg/dL — AB (ref 0.60–0.93)
Glucose, Bld: 104 mg/dL — ABNORMAL HIGH (ref 65–99)
Potassium: 3.9 mmol/L (ref 3.5–5.3)
Sodium: 133 mmol/L — ABNORMAL LOW (ref 135–146)

## 2015-02-06 MED ORDER — HYDROCODONE-ACETAMINOPHEN 5-325 MG PO TABS: 1.0000 | ORAL_TABLET | Freq: Four times a day (QID) | ORAL | Status: DC | PRN

## 2015-02-06 MED ORDER — OMEPRAZOLE 20 MG PO CPDR: 20.0000 mg | DELAYED_RELEASE_CAPSULE | Freq: Every day | ORAL | Status: DC

## 2015-02-06 NOTE — Patient Instructions (Signed)
Please take no more than 1 Alleve twice a day Can take 1/2 to 1 pain pill every 6 hours as needed for pain Stay active to help your knee pain- keep moving Stay on a regular schedule with your eating, chores and bedtime

## 2015-02-06 NOTE — Progress Notes (Signed)
Subjective:    Patient ID: Casey [name], female    DOB: 10-18-1935, 80 y.o.   MRN: [id number]  HPI This is a pleasant 80 yo female who presents today for CPE.   Last CPE- Mammo- 09/07/13 Pap- not applicable Colonoscopy- not applicable  Tdap- 2009 Zoster- she is not sure if she had this or not.  Flu- 09/2014 Eye- has an appointment scheduled Dental-  Exercise- she continues to clean her own house  She has a longstanding history of knee pain. She has noticed more pain with cold and damp weather. She has been taking Alleve x 3 and norco 5-325 2-3 times a day as needed. She had an injection series last summer and didn't feel like they helped. She was offered surgery but declined.   She lives alone and maintains her own home. She rides public transportation or her niece helps her with transportation. She attends church and has good neighbors. Does not require cane or walker.   Past Medical History  Diagnosis Date  . HYPERTENSION, BENIGN ESSENTIAL 03/03/2007  . HYPERCHOLESTEROLEMIA   . SPRAIN&STRAIN OTH SPEC SITES SHOULDER&UPPER ARM   . SINUSITIS, RECURRENT   . Problems with swallowing and mastication   . OSTEOPOROSIS   . OSTEOARTHRITIS, KNEE   . OBESITY   . Microscopic hematuria   . LEG EDEMA, BILATERAL   . HYPERGLYCEMIA   . HIP PAIN, LEFT   . Headache(784.0)   . FLANK PAIN, LEFT   . FATIGUE   . ESOPHAGEAL STRICTURE   . CYSTOCELE WITH INCOMPLETE UTERINE PROLAPSE   . CONSTIPATION   . BENIGN POSITIONAL VERTIGO   . ARM PAIN, RIGHT   . ANXIETY   . ALLERGIC RHINITIS   . Carcinoma in situ of colon   . Peripheral neuropathy, idiopathic    Past Surgical History  Procedure Laterality Date  . Left heart catheterization with coronary angiogram N/A 12/02/2010    Procedure: LEFT HEART CATHETERIZATION WITH CORONARY ANGIOGRAM;  Surgeon: Peter M Swaziland, MD;  Location[facility] CATH LAB;  Service: Cardiovascular;  Laterality: N/A;   Family History  Problem Relation Age of Onset  . Colon  cancer    . Diabetes    . Coronary artery disease Mother 54  . Cancer Father 29  . Heart disease Father   . Coronary artery disease Sister   . Diabetes type II Sister   . Cancer Sister   . Coronary artery disease Brother   . Kidney disease Brother   . Coronary artery disease Brother    Social History  Substance Use Topics  . Smoking status: Never Smoker   . Smokeless tobacco: Never Used  . Alcohol Use: No    Review of Systems  Constitutional: Negative for fever. Fatigue: chronic.  HENT: Positive for congestion and ear pain. Negative for rhinorrhea.   Eyes: Positive for visual disturbance (has glasses, doesn't wear, they don't help.).  Respiratory: Negative for shortness of breath.   Cardiovascular: Negative for chest pain.  Gastrointestinal: Positive for diarrhea (1x per week).  Genitourinary: Negative for dysuria, hematuria and vaginal bleeding.  Musculoskeletal: Positive for back pain (low back).  Skin: Negative.   Neurological: Positive for dizziness (daily, chronic) and headaches.  Hematological: Negative.   Psychiatric/Behavioral:       Feels sad that she no longer drives and doesn't have a car.       Objective:   Physical Exam Physical Exam  Constitutional: She is oriented to person, place, and time. She appears well-developed and  well-nourished. No distress.  HENT:  Head: Normocephalic and atraumatic.  Right Ear: External ear normal.  Left Ear: External ear normal.  Nose: Nose normal.  Mouth/Throat: Oropharynx is clear and moist. No oropharyngeal exudate.  Eyes: Conjunctivae are normal. Pupils are equal, round, and reactive to light.  Neck: Normal range of motion. Neck supple. No JVD present. No thyromegaly present.  Cardiovascular: Normal rate, regular rhythm, normal heart sounds and intact distal pulses.   Pulmonary/Chest: Effort normal and breath sounds normal. Right breast exhibits no inverted nipple, no mass, no nipple discharge, no skin change and no  tenderness. Left breast exhibits no inverted nipple, no mass, no nipple discharge, no skin change and no tenderness. Breasts are symmetrical.  Abdominal: Soft. Bowel sounds are normal. She exhibits no distension and no mass. There is no tenderness. There is no rebound and no guarding.  Musculoskeletal: Good range of motion. Good strength. No joint swelling or erythema. No edema.    Lymphadenopathy:    She has no cervical adenopathy.  Neurological: She is alert and oriented to person, place, and time. She has normal reflexes.  Skin: Skin is warm and dry. She is not diaphoretic.  Psychiatric: She has a normal mood and affect. Her behavior is normal. Judgment and thought content normal.  Vitals reviewed.  BP 155/82 mmHg  Pulse 75  Temp(Src) 98.1 F (36.7 C) (Oral)  Resp 16  Ht 5' 2.25" (1.581 m)  Wt 204 lb 4.8 oz (92.67 kg)  BMI 37.07 kg/m2  SpO2 95% Wt Readings from Last 3 Encounters:  02/06/15 204 lb 4.8 oz (92.67 kg)  10/11/14 195 lb 9.6 oz (88.724 kg)  06/08/14 193 lb (87.544 kg)       Assessment & Plan:  1. Annual physical exam  2. HYPERTENSION, BENIGN ESSENTIAL - continue maxizide - encouraged her to decrease her Alleve intake and reviewed potential serious side effects - Basic metabolic panel  3. Primary osteoarthritis of both knees - Limit NSAIDs - encouraged her to use heat or ice for comfort, avoid sitting or standing in one place for more than 30 minutes, demonstrated some exercises for quadricept strengthening and ROM - HYDROcodone-acetaminophen (NORCO/VICODIN) 5-325 MG tablet; Take 1 tablet by mouth every 6 (six) hours as needed for moderate pain.  Dispense: 120 tablet; Refill: 0  4. Chronic fatigue - encouraged patient to keep a regular schedule, participate in social activities and get out of the house frequenctly  5. Gastroesophageal reflux disease, esophagitis presence not specified - omeprazole (PRILOSEC) 20 MG capsule; Take 1 capsule (20 mg total) by mouth  daily.  Dispense: 30 capsule; Refill: 5  6. Anemia, unspecified anemia type - CBC  - follow up in 3 months with Dr. Baron Sane, FNP-BC  [facility]  02/10/2015 1:47 PM

## 2015-02-14 ENCOUNTER — Encounter: Payer: Medicare Other | Admitting: Family Medicine

## 2015-04-18 ENCOUNTER — Other Ambulatory Visit: Payer: Self-pay | Admitting: Family Medicine

## 2015-04-22 ENCOUNTER — Telehealth: Payer: Self-pay | Admitting: Family Medicine

## 2015-04-22 NOTE — Telephone Encounter (Signed)
Please call patient and let her know that her rx is ready for pick up at 102. She needs to make an appointment with Dr. Clelia CroftShaw prior to her next refill.

## 2015-04-29 ENCOUNTER — Other Ambulatory Visit: Payer: Self-pay | Admitting: Family Medicine

## 2015-04-30 NOTE — Telephone Encounter (Signed)
Debbie just refilled pt's hydrocodone a week ago for #120.

## 2015-05-07 ENCOUNTER — Other Ambulatory Visit: Payer: Self-pay | Admitting: Family Medicine

## 2015-05-07 ENCOUNTER — Telehealth: Payer: Self-pay

## 2015-05-07 NOTE — Telephone Encounter (Signed)
Dr Clelia CroftShaw, pt states she only had a half of bottle when she came in for refill. This is very confusing as it seems she should not need a refill. Can we check the database and see what pt received.

## 2015-05-07 NOTE — Telephone Encounter (Signed)
Can we refill Gabapentin did see any mention of it at last appointment

## 2015-05-07 NOTE — Telephone Encounter (Signed)
Patient is calling to request a refill for hydrocodone °

## 2015-05-07 NOTE — Telephone Encounter (Signed)
Emi Belfasteborah B Gessner, FNP at 04/22/2015 2:16 PM     Status: Signed       Expand All Collapse All   Please call patient and let her know that her rx is ready for pick up at 102. She needs to make an appointment with Dr. Clelia CroftShaw prior to her next refill.

## 2015-05-07 NOTE — Telephone Encounter (Signed)
Left message for pt to call back  °

## 2015-05-07 NOTE — Telephone Encounter (Signed)
She has not filled the most recent prescription from [name] [name] Did she come to [address] and pick it up? It may still be in the building. If it isn't and she signed for it then she needs to [facility] for future refills. Casey BostonMichael Shannen [place], MS, PA-C 7:09 PM, 05/07/2015

## 2015-05-08 NOTE — Telephone Encounter (Signed)
Yes she did. Advised to pick up.

## 2015-06-18 ENCOUNTER — Other Ambulatory Visit: Payer: Self-pay | Admitting: Family Medicine

## 2015-06-25 ENCOUNTER — Other Ambulatory Visit: Payer: Self-pay | Admitting: Family Medicine

## 2015-06-26 ENCOUNTER — Telehealth: Payer: Self-pay | Admitting: *Deleted

## 2015-06-26 NOTE — Telephone Encounter (Signed)
Patient states she has an appointment scheduled  °

## 2015-07-02 ENCOUNTER — Ambulatory Visit (INDEPENDENT_AMBULATORY_CARE_PROVIDER_SITE_OTHER): Payer: Medicare Other | Admitting: Family Medicine

## 2015-07-02 VITALS — BP 144/78 | HR 62 | Temp 98.4°F | Resp 20 | Ht 62.0 in | Wt 202.1 lb

## 2015-07-02 DIAGNOSIS — E871 Hypo-osmolality and hyponatremia: Secondary | ICD-10-CM

## 2015-07-02 DIAGNOSIS — F411 Generalized anxiety disorder: Secondary | ICD-10-CM | POA: Diagnosis not present

## 2015-07-02 DIAGNOSIS — I10 Essential (primary) hypertension: Secondary | ICD-10-CM

## 2015-07-02 DIAGNOSIS — R7303 Prediabetes: Secondary | ICD-10-CM

## 2015-07-02 DIAGNOSIS — M170 Bilateral primary osteoarthritis of knee: Secondary | ICD-10-CM

## 2015-07-02 DIAGNOSIS — K219 Gastro-esophageal reflux disease without esophagitis: Secondary | ICD-10-CM

## 2015-07-02 DIAGNOSIS — K222 Esophageal obstruction: Secondary | ICD-10-CM

## 2015-07-02 LAB — COMPLETE METABOLIC PANEL WITH GFR
ALBUMIN: 3.9 g/dL (ref 3.6–5.1)
ALK PHOS: 69 U/L (ref 33–130)
ALT: 15 U/L (ref 6–29)
AST: 21 U/L (ref 10–35)
BILIRUBIN TOTAL: 0.4 mg/dL (ref 0.2–1.2)
BUN: 10 mg/dL (ref 7–25)
CO2: 25 mmol/L (ref 20–31)
Calcium: 9.2 mg/dL (ref 8.6–10.4)
Chloride: 101 mmol/L (ref 98–110)
Creat: 0.95 mg/dL — ABNORMAL HIGH (ref 0.60–0.93)
GFR, EST AFRICAN AMERICAN: 66 mL/min (ref >=60)
GFR, EST NON AFRICAN AMERICAN: 58 mL/min — AB (ref >=60)
GLUCOSE: 105 mg/dL — AB (ref 65–99)
Potassium: 4 mmol/L (ref 3.5–5.3)
SODIUM: 138 mmol/L (ref 135–146)
TOTAL PROTEIN: 6.7 g/dL (ref 6.1–8.1)

## 2015-07-02 LAB — CBC
HEMATOCRIT: 36.2 % (ref 35.0–45.0)
HEMOGLOBIN: 11.8 g/dL (ref 11.7–15.5)
MCH: 27.3 pg (ref 27.0–33.0)
MCHC: 32.6 g/dL (ref 32.0–36.0)
MCV: 83.6 fL (ref 80.0–100.0)
MPV: 9.8 fL (ref 7.5–12.5)
PLATELETS: 253 10*3/uL (ref 140–400)
RBC: 4.33 MIL/uL (ref 3.80–5.10)
RDW: 15.4 % — AB (ref 11.0–15.0)
WBC: 6.3 10*3/uL (ref 3.8–10.8)

## 2015-07-02 LAB — HEMOGLOBIN A1C
Hgb A1c MFr Bld: 6.6 % — ABNORMAL HIGH (ref <5.7)
MEAN PLASMA GLUCOSE: 143 mg/dL

## 2015-07-02 MED ORDER — ALPRAZOLAM 1 MG PO TABS: 1.0000 mg | ORAL_TABLET | Freq: Every day | ORAL | Status: DC

## 2015-07-02 MED ORDER — ESCITALOPRAM OXALATE 20 MG PO TABS: ORAL_TABLET | ORAL | Status: DC

## 2015-07-02 MED ORDER — OMEPRAZOLE 20 MG PO CPDR: 20.0000 mg | DELAYED_RELEASE_CAPSULE | Freq: Every day | ORAL | Status: DC

## 2015-07-02 MED ORDER — ALPRAZOLAM 1 MG PO TABS
1.0000 mg | ORAL_TABLET | Freq: Every day | ORAL | Status: DC
Start: 2015-07-02 — End: 2016-02-19

## 2015-07-02 MED ORDER — HYDROCODONE-ACETAMINOPHEN 5-325 MG PO TABS: 1.0000 | ORAL_TABLET | Freq: Four times a day (QID) | ORAL | Status: DC | PRN

## 2015-07-02 NOTE — Progress Notes (Signed)
Subjective:    Patient ID: Casey [name], female    DOB: 1935/11/11, 80 y.o.   MRN: [id number]  HPI This is a 80 yo female who presents today requesting refills of medication. She is sleeping well with xanax, doesn't sleep well without xanax. She has been taking xanax since 1980s. She has very frequent, severe right knee pain and takes oxycodone 2 times a day. Does not think her lexapro dose is high enough, mood is down.   Has more reflux symptoms and some difficulty swallowing tough meat. Sees Dr. Madilyn Fireman who has done a dilation in the past. Would like refill of omeprazole.   Past Medical History  Diagnosis Date  . HYPERTENSION, BENIGN ESSENTIAL 03/03/2007  . HYPERCHOLESTEROLEMIA   . SPRAIN&STRAIN OTH SPEC SITES SHOULDER&UPPER ARM   . SINUSITIS, RECURRENT   . Problems with swallowing and mastication   . OSTEOPOROSIS   . OSTEOARTHRITIS, KNEE   . OBESITY   . Microscopic hematuria   . LEG EDEMA, BILATERAL   . HYPERGLYCEMIA   . HIP PAIN, LEFT   . Headache(784.0)   . FLANK PAIN, LEFT   . FATIGUE   . ESOPHAGEAL STRICTURE   . CYSTOCELE WITH INCOMPLETE UTERINE PROLAPSE   . CONSTIPATION   . BENIGN POSITIONAL VERTIGO   . ARM PAIN, RIGHT   . ANXIETY   . ALLERGIC RHINITIS   . Carcinoma in situ of colon   . Peripheral neuropathy, idiopathic    Past Surgical History  Procedure Laterality Date  . Left heart catheterization with coronary angiogram N/A 12/02/2010    Procedure: LEFT HEART CATHETERIZATION WITH CORONARY ANGIOGRAM;  Surgeon: Peter M Swaziland, MD;  Location: [facility] CATH LAB;  Service: Cardiovascular;  Laterality: N/A;   Family History  Problem Relation Age of Onset  . Colon cancer    . Diabetes    . Coronary artery disease Mother 79  . Cancer Father 12  . Heart disease Father   . Coronary artery disease Sister   . Diabetes type II Sister   . Cancer Sister   . Coronary artery disease Brother   . Kidney disease Brother   . Coronary artery disease Brother    Social History    Substance Use Topics  . Smoking status: Never Smoker   . Smokeless tobacco: Never Used  . Alcohol Use: No      Review of Systems No chest pain, no SOB, occasional ankle swelling. + pain in knees daily    Objective:   Physical Exam Physical Exam  Constitutional: Oriented to person, place, and time. She appears well-developed and well-nourished.  HENT:  Head: Normocephalic and atraumatic.  Eyes: Conjunctivae are normal.  Neck: Normal range of motion. Neck supple.  Cardiovascular: Normal rate, regular rhythm and normal heart sounds.   Pulmonary/Chest: Effort normal and breath sounds normal.  Musculoskeletal: Normal range of motion.  Neurological: Alert and oriented to person, place, and time.  Skin: Skin is warm and dry.  Psychiatric: Normal mood and affect. Behavior is normal. Judgment and thought content normal.  Vitals reviewed.     BP 167/76 mmHg  Pulse 62  Temp(Src) 98.4 F (36.9 C) (Oral)  Resp 20  Ht 5\' 2"  (1.575 m)  Wt 202 lb 2 oz (91.683 kg)  BMI 36.96 kg/m2 Wt Readings from Last 3 Encounters:  07/02/15 202 lb 2 oz (91.683 kg)  02/06/15 204 lb 4.8 oz (92.67 kg)  10/11/14 195 lb 9.6 oz (88.724 kg)  Blood pressure recheck- 144/78 Depression screen  [facility] 2/9 07/02/2015 02/06/2015 10/11/2014 06/08/2014 03/09/2014  Decreased Interest 0 0 0 0 0  Down, Depressed, Hopeless 1 0 0 0 0  PHQ - 2 Score 1 0 0 0 0  Altered sleeping - - - - -  Tired, decreased energy - - - - -  Change in appetite - - - - -  Feeling bad or failure about yourself  - - - - -  Trouble concentrating - - - - -  Moving slowly or fidgety/restless - - - - -  Suicidal thoughts - - - - -  PHQ-9 Score - - - - -        Assessment & Plan:  1. ESOPHAGEAL STRICTURE - she will make a follow up appointment with Dr. Madilyn Fireman - encouraged her to eat soft foods, chew thoroughly, continue omeprazole  2. Primary osteoarthritis of both knees - HYDROcodone-acetaminophen (NORCO/VICODIN) 5-325 MG tablet; Take 1  tablet by mouth every 6 (six) hours as needed for moderate pain. Please schedule a follow up appointment with Dr. Clelia Croft before next refill.  Dispense: 120 tablet; Refill: 0  3. HYPERTENSION, BENIGN ESSENTIAL - CBC  4. Anxiety state - will try to increase lexapro dose to see if helps with mood - escitalopram (LEXAPRO) 20 MG tablet; TAKE 1 TABLET BY MOUTH ONCE DAILY  Dispense: 30 tablet; Refill: 5 - ALPRAZolam (XANAX) 1 MG tablet; Take 1 tablet (1 mg total) by mouth at bedtime.  Dispense: 30 tablet; Refill: 5  5. Hyponatremia - CMP  6. Prediabetes - Hemoglobin A1c - COMPLETE METABOLIC PANEL WITH GFR  7. Gastroesophageal reflux disease, esophagitis presence not specified - omeprazole (PRILOSEC) 20 MG capsule; Take 1 capsule (20 mg total) by mouth daily.  Dispense: 30 capsule; Refill: 5   [name], FNP-BC  [facility], [facility]  07/08/2015 6:30 PM

## 2015-07-02 NOTE — Patient Instructions (Signed)
I have sent in a new prescription for your Lexapro (escalitopram). It is for 20 mg. You can take 2 of the 10 mg tablets until you run out.     IF you received an x-ray today, you will receive an invoice from [facility]. Please contact [facility] at [phone] with questions or concerns regarding your invoice.   IF you received labwork today, you will receive an invoice from [facility]. Please contact [facility] at [phone] with questions or concerns regarding your invoice.   Our billing staff will not be able to assist you with questions regarding bills from these companies.  You will be contacted with the lab results as soon as they are available. The fastest way to get your results is to activate your My Chart account. Instructions are located on the last page of this paperwork. If you have not heard from us regarding the results in 2 weeks, please contact this office.

## 2015-07-04 ENCOUNTER — Encounter: Payer: Self-pay | Admitting: Family Medicine

## 2015-07-04 ENCOUNTER — Other Ambulatory Visit: Payer: Self-pay | Admitting: Family Medicine

## 2015-07-09 ENCOUNTER — Ambulatory Visit (INDEPENDENT_AMBULATORY_CARE_PROVIDER_SITE_OTHER): Payer: Medicare Other | Admitting: Podiatry

## 2015-07-09 ENCOUNTER — Encounter: Payer: Self-pay | Admitting: Podiatry

## 2015-07-09 DIAGNOSIS — B351 Tinea unguium: Secondary | ICD-10-CM | POA: Diagnosis not present

## 2015-07-09 DIAGNOSIS — M79676 Pain in unspecified toe(s): Secondary | ICD-10-CM

## 2015-07-09 NOTE — Progress Notes (Signed)
Patient ID: Casey [name], female   DOB: [birth year], 80 y.o.   MRN: [id number]  Subjective: This patient presents again complaining of painful toenails and requests toenail debridement. Last visit for a similar service was on 05/08/2014  Objective: Orientated 3 DP and PT pulses 2/4 bilaterally No open skin lesions bilaterally The toenails are elongated, hypertrophic, incurvated, discolored and tender to palpation 6-10  Assessment: Symptomatic onychomycoses 6-10 Prediabetic  Plan: Debridement of toenails 10 without any bleeding  Reappoint 3 months

## 2015-07-09 NOTE — Patient Instructions (Signed)
Diabetes and Foot Care Diabetes may cause you to have problems because of poor blood supply (circulation) to your feet and legs. This may cause the skin on your feet to become thinner, break easier, and heal more slowly. Your skin may become dry, and the skin may peel and crack. You may also have nerve damage in your legs and feet causing decreased feeling in them. You may not notice minor injuries to your feet that could lead to infections or more serious problems. Taking care of your feet is one of the most important things you can do for yourself.  HOME CARE INSTRUCTIONS  Wear shoes at all times, even in the house. Do not go barefoot. Bare feet are easily injured.  Check your feet daily for blisters, cuts, and redness. If you cannot see the bottom of your feet, use a mirror or ask someone for help.  Wash your feet with warm water (do not use hot water) and mild soap. Then pat your feet and the areas between your toes until they are completely dry. Do not soak your feet as this can dry your skin.  Apply a moisturizing lotion or petroleum jelly (that does not contain alcohol and is unscented) to the skin on your feet and to dry, brittle toenails. Do not apply lotion between your toes.  Trim your toenails straight across. Do not dig under them or around the cuticle. File the edges of your nails with an emery board or nail file.  Do not cut corns or calluses or try to remove them with medicine.  Wear clean socks or stockings every day. Make sure they are not too tight. Do not wear knee-high stockings since they may decrease blood flow to your legs.  Wear shoes that fit properly and have enough cushioning. To break in new shoes, wear them for just a few hours a day. This prevents you from injuring your feet. Always look in your shoes before you put them on to be sure there are no objects inside.  Do not cross your legs. This may decrease the blood flow to your feet.  If you find a minor scrape,  cut, or break in the skin on your feet, keep it and the skin around it clean and dry. These areas may be cleansed with mild soap and water. Do not cleanse the area with peroxide, alcohol, or iodine.  When you remove an adhesive bandage, be sure not to damage the skin around it.  If you have a wound, look at it several times a day to make sure it is healing.  Do not use heating pads or hot water bottles. They may burn your skin. If you have lost feeling in your feet or legs, you may not know it is happening until it is too late.  Make sure your health care provider performs a complete foot exam at least annually or more often if you have foot problems. Report any cuts, sores, or bruises to your health care provider immediately. SEEK MEDICAL CARE IF:   You have an injury that is not healing.  You have cuts or breaks in the skin.  You have an ingrown nail.  You notice redness on your legs or feet.  You feel burning or tingling in your legs or feet.  You have pain or cramps in your legs and feet.  Your legs or feet are numb.  Your feet always feel cold. SEEK IMMEDIATE MEDICAL CARE IF:   There is increasing redness,   swelling, or pain in or around a wound.  There is a red line that goes up your leg.  Pus is coming from a wound.  You develop a fever or as directed by your health care provider.  You notice a bad smell coming from an ulcer or wound.   This information is not intended to replace advice given to you by your health care provider. Make sure you discuss any questions you have with your health care provider.   Document Released: 01/03/2000 Document Revised: 09/07/2012 Document Reviewed: 06/14/2012 Elsevier Interactive Patient Education 2016 Elsevier Inc.  

## 2015-07-17 ENCOUNTER — Other Ambulatory Visit: Payer: Self-pay | Admitting: Family Medicine

## 2015-07-22 ENCOUNTER — Other Ambulatory Visit: Payer: Self-pay | Admitting: Family Medicine

## 2015-07-24 ENCOUNTER — Other Ambulatory Visit: Payer: Self-pay

## 2015-07-24 DIAGNOSIS — F411 Generalized anxiety disorder: Secondary | ICD-10-CM

## 2015-07-24 NOTE — Telephone Encounter (Signed)
Patient is calling to reqeust a refill for Gabapentin and alprazolam. Patient states she will have a ride tomorrow and would like her refills by then.  [facility] on [address]  Patient phone: [phone]

## 2015-07-25 NOTE — Telephone Encounter (Signed)
Dr Clelia CroftShaw, I'll forward this alprazolam req to you in Debbie's absence since you are listed as her PCP and saw pt before she started seeing Debbie. Gabapentin has already been sent to pharm.

## 2015-07-26 NOTE — Telephone Encounter (Signed)
Pt just was given a paper rx for a 6 mo supply on her alprazolam 3 wks ago on 07/02/15 at her visit with Debbie.  If pt cannot find rx, ok to call it in but caution pt to shred if it is found.

## 2015-07-30 ENCOUNTER — Other Ambulatory Visit: Payer: Self-pay | Admitting: Family Medicine

## 2015-07-31 NOTE — Telephone Encounter (Signed)
Called in Rx and operator advised pt that was done and to shred paper Rx if found.

## 2015-07-31 NOTE — Telephone Encounter (Signed)
Dr Clelia CroftShaw, you last saw pt and discussed vertigo. Pt was just seen for check up and med refills in June. Is this something you want to OK RFs, or should I give 1 RF and have pt come back in for f/up on vertigo?

## 2015-08-09 ENCOUNTER — Ambulatory Visit (INDEPENDENT_AMBULATORY_CARE_PROVIDER_SITE_OTHER): Payer: Medicare Other | Admitting: Urgent Care

## 2015-08-09 ENCOUNTER — Ambulatory Visit (INDEPENDENT_AMBULATORY_CARE_PROVIDER_SITE_OTHER): Payer: Medicare Other

## 2015-08-09 VITALS — BP 142/82 | HR 88 | Temp 98.7°F | Resp 16 | Ht 62.0 in | Wt 195.8 lb

## 2015-08-09 DIAGNOSIS — E119 Type 2 diabetes mellitus without complications: Secondary | ICD-10-CM

## 2015-08-09 DIAGNOSIS — R202 Paresthesia of skin: Secondary | ICD-10-CM

## 2015-08-09 DIAGNOSIS — K5900 Constipation, unspecified: Secondary | ICD-10-CM | POA: Diagnosis not present

## 2015-08-09 DIAGNOSIS — R5383 Other fatigue: Secondary | ICD-10-CM | POA: Diagnosis not present

## 2015-08-09 DIAGNOSIS — R109 Unspecified abdominal pain: Secondary | ICD-10-CM

## 2015-08-09 DIAGNOSIS — R200 Anesthesia of skin: Secondary | ICD-10-CM

## 2015-08-09 LAB — POC MICROSCOPIC URINALYSIS (UMFC): MUCUS RE: ABSENT

## 2015-08-09 LAB — POCT CBC
GRANULOCYTE PERCENT: 50.2 % (ref 37–80)
HCT, POC: 37.6 % — AB (ref 37.7–47.9)
Hemoglobin: 13 g/dL (ref 12.2–16.2)
Lymph, poc: 2.9 (ref 0.6–3.4)
MCH: 28.7 pg (ref 27–31.2)
MCHC: 34.5 g/dL (ref 31.8–35.4)
MCV: 83.2 fL (ref 80–97)
MID (CBC): 0.4 (ref 0–0.9)
MPV: 6.9 fL (ref 0–99.8)
PLATELET COUNT, POC: 264 10*3/uL (ref 142–424)
POC Granulocyte: 3.4 (ref 2–6.9)
POC LYMPH PERCENT: 43.2 %L (ref 10–50)
POC MID %: 6.6 %M (ref 0–12)
RBC: 4.52 M/uL (ref 4.04–5.48)
RDW, POC: 16.1 %
WBC: 6.7 10*3/uL (ref 4.6–10.2)

## 2015-08-09 LAB — POCT URINALYSIS DIP (MANUAL ENTRY)
BILIRUBIN UA: NEGATIVE
Bilirubin, UA: NEGATIVE
Glucose, UA: NEGATIVE
Leukocytes, UA: NEGATIVE
Nitrite, UA: NEGATIVE
PH UA: 6.5
Protein Ur, POC: NEGATIVE
SPEC GRAV UA: 1.02
UROBILINOGEN UA: 0.2

## 2015-08-09 LAB — TSH: TSH: 1.06 m[IU]/L

## 2015-08-09 MED ORDER — DOCUSATE SODIUM 50 MG PO CAPS: 50.0000 mg | ORAL_CAPSULE | Freq: Two times a day (BID) | ORAL | Status: DC

## 2015-08-09 MED ORDER — POLYETHYLENE GLYCOL 3350 17 GM/SCOOP PO POWD: 17.0000 g | Freq: Every day | ORAL | Status: DC | PRN

## 2015-08-09 NOTE — Patient Instructions (Addendum)
Please use Miralax for moderate to severe constipation. Take this once a day for the next 2-3 days. Please also start docusate stool softener, twice a day for at least 1 week. If stools become loose, cut down to once a day for another week. If stools remain loose, cut back to 1 pill every other day for a third week. You can stop docusate thereafter and resume as needed for constipation.  To help reduce constipation and promote bowel health: 1. Drink at least 64 ounces of water each day 2. Eat plenty of fiber (fruits, vegetables, whole grains, legumes) 3. Be physically active or exercise including walking, jogging, swimming, yoga, etc. 4. For active constipation use a stool softener (docusate) or an osmotic laxative (like Miralax) each day, or as needed.   Constipation, Adult Constipation is when a person has fewer than three bowel movements a week, has difficulty having a bowel movement, or has stools that are dry, hard, or larger than normal. As people grow older, constipation is more common. A low-fiber diet, not taking in enough fluids, and taking certain medicines may make constipation worse.  CAUSES   Certain medicines, such as antidepressants, pain medicine, iron supplements, antacids, and water pills.   Certain diseases, such as diabetes, irritable bowel syndrome (IBS), thyroid disease, or depression.   Not drinking enough water.   Not eating enough fiber-rich foods.   Stress or travel.   Lack of physical activity or exercise.   Ignoring the urge to have a bowel movement.   Using laxatives too much.  SIGNS AND SYMPTOMS   Having fewer than three bowel movements a week.   Straining to have a bowel movement.   Having stools that are hard, dry, or larger than normal.   Feeling full or bloated.   Pain in the lower abdomen.   Not feeling relief after having a bowel movement.  DIAGNOSIS  Your health care provider will take a medical history and perform a  physical exam. Further testing may be done for severe constipation. Some tests may include:  A barium enema X-ray to examine your rectum, colon, and, sometimes, your small intestine.   A sigmoidoscopy to examine your lower colon.   A colonoscopy to examine your entire colon. TREATMENT  Treatment will depend on the severity of your constipation and what is causing it. Some dietary treatments include drinking more fluids and eating more fiber-rich foods. Lifestyle treatments may include regular exercise. If these diet and lifestyle recommendations do not help, your health care provider may recommend taking over-the-counter laxative medicines to help you have bowel movements. Prescription medicines may be prescribed if over-the-counter medicines do not work.  HOME CARE INSTRUCTIONS   Eat foods that have a lot of fiber, such as fruits, vegetables, whole grains, and beans.  Limit foods high in fat and processed sugars, such as french fries, hamburgers, cookies, candies, and soda.   A fiber supplement may be added to your diet if you cannot get enough fiber from foods.   Drink enough fluids to keep your urine clear or pale yellow.   Exercise regularly or as directed by your health care provider.   Go to the restroom when you have the urge to go. Do not hold it.   Only take over-the-counter or prescription medicines as directed by your health care provider. Do not take other medicines for constipation without talking to your health care provider first.  SEEK IMMEDIATE MEDICAL CARE IF:   You have bright red blood in  your stool.   Your constipation lasts for more than 4 days or gets worse.   You have abdominal or rectal pain.   You have thin, pencil-like stools.   You have unexplained weight loss. MAKE SURE YOU:   Understand these instructions.  Will watch your condition.  Will get help right away if you are not doing well or get worse.   This information is not intended  to replace advice given to you by your health care provider. Make sure you discuss any questions you have with your health care provider.   Document Released: 10/04/2003 Document Revised: 01/26/2014 Document Reviewed: 10/17/2012 Elsevier Interactive Patient Education 2016 [employer]    Diabetes Mellitus and Food It is important for you to manage your blood sugar (glucose) level. Your blood glucose level can be greatly affected by what you eat. Eating healthier foods in the appropriate amounts throughout the day at about the same time each day will help you control your blood glucose level. It can also help slow or prevent worsening of your diabetes mellitus. Healthy eating may even help you improve the level of your blood pressure and reach or maintain a healthy weight.  General recommendations for healthful eating and cooking habits include:  Eating meals and snacks regularly. Avoid going long periods of time without eating to lose weight.  Eating a diet that consists mainly of plant-based foods, such as fruits, vegetables, nuts, legumes, and whole grains.  Using low-heat cooking methods, such as baking, instead of high-heat cooking methods, such as deep frying. Work with your dietitian to make sure you understand how to use the Nutrition Facts information on food labels. HOW CAN FOOD AFFECT ME? Carbohydrates Carbohydrates affect your blood glucose level more than any other type of food. Your dietitian will help you determine how many carbohydrates to eat at each meal and teach you how to count carbohydrates. Counting carbohydrates is important to keep your blood glucose at a healthy level, especially if you are using insulin or taking certain medicines for diabetes mellitus. Alcohol Alcohol can cause sudden decreases in blood glucose (hypoglycemia), especially if you use insulin or take certain medicines for diabetes mellitus. Hypoglycemia can be a life-threatening condition. Symptoms of  hypoglycemia (sleepiness, dizziness, and disorientation) are similar to symptoms of having too much alcohol.  If your health care provider has given you approval to drink alcohol, do so in moderation and use the following guidelines:  Women should not have more than one drink per day, and men should not have more than two drinks per day. One drink is equal to:  12 oz of beer.  5 oz of wine.  1 oz of hard liquor.  Do not drink on an empty stomach.  Keep yourself hydrated. Have water, diet soda, or unsweetened iced tea.  Regular soda, juice, and other mixers might contain a lot of carbohydrates and should be counted. WHAT FOODS ARE NOT RECOMMENDED? As you make food choices, it is important to remember that all foods are not the same. Some foods have fewer nutrients per serving than other foods, even though they might have the same number of calories or carbohydrates. It is difficult to get your body what it needs when you eat foods with fewer nutrients. Examples of foods that you should avoid that are high in calories and carbohydrates but low in nutrients include:  Trans fats (most processed foods list trans fats on the Nutrition Facts label).  Regular soda.  Juice.  Candy.  Sweets, such as cake, pie, doughnuts, and cookies.  Fried foods. WHAT FOODS CAN I EAT? Eat nutrient-rich foods, which will nourish your body and keep you healthy. The food you should eat also will depend on several factors, including:  The calories you need.  The medicines you take.  Your weight.  Your blood glucose level.  Your blood pressure level.  Your cholesterol level. You should eat a variety of foods, including:  Protein.  Lean cuts of meat.  Proteins low in saturated fats, such as fish, egg whites, and beans. Avoid processed meats.  Fruits and vegetables.  Fruits and vegetables that may help control blood glucose levels, such as apples, avocados, blue berries, oranges, mangoes, and  yams.  Vegetables you can eat include broccoli, cauliflower, asparagus, green beans, bell pepper  Dairy products.  Choose fat-free or low-fat dairy products, such as milk, yogurt, and cheese.  Grains, bread, pasta, and rice.  Choose whole grain products, such as multigrain bread, whole oats, and brown rice. These foods may help control blood pressure.  Fats.  Foods containing healthful fats, such as nuts, avocado, olive oil, canola oil, and fish. DOES EVERYONE WITH DIABETES MELLITUS HAVE THE SAME MEAL PLAN? Because every person with diabetes mellitus is different, there is not one meal plan that works for everyone. It is very important that you meet with a dietitian who will help you create a meal plan that is just right for you.   This information is not intended to replace advice given to you by your health care provider. Make sure you discuss any questions you have with your health care provider.   Document Released: 10/02/2004 Document Revised: 01/26/2014 Document Reviewed: 12/02/2012 Elsevier Interactive Patient Education 2016 [employer].      IF you received an x-ray today, you will receive an invoice from [facility]. Please contact [facility] at [phone] with questions or concerns regarding your invoice.   IF you received labwork today, you will receive an invoice from [facility]. Please contact [facility] at [phone] with questions or concerns regarding your invoice.   Our billing staff will not be able to assist you with questions regarding bills from these companies.  You will be contacted with the lab results as soon as they are available. The fastest way to get your results is to activate your My Chart account. Instructions are located on the last page of this paperwork. If you have not heard from [name] regarding the results in 2 weeks, please contact this office.

## 2015-08-09 NOTE — Progress Notes (Signed)
MRN: [id number] DOB: February 09, 1935  Subjective:   Casey [name] is a 80 y.o. female presenting for chief complaint of Abdominal Pain and Fatigue  Reports 1 week history of belly pain, fatigue. Her pain is like a pressure type sensation over mid abdomen, constant. She has been having 1-3 small, smooth bowel movements per day. Denies fever, diarrhea, nausea, vomiting, constipation, no straining, no bloody stools, dysuria, hematuria. Last meal was last night, does not eat fiber regularly. Admits history of anemia, takes iron supplement for this but no laxatives or stool softeners. Has also had difficulty with constipation in the past.  Casey [name] has a current medication list which includes the following prescription(s): alprazolam, cholecalciferol, vitamin d3, cyanocobalamin, escitalopram, gabapentin, blood glucose test strips, hydrocodone-acetaminophen, ipratropium, meclizine, omeprazole, potassium chloride sa, and triamterene-hydrochlorothiazide. Also has No Known Allergies.  Casey [name]  has a past medical history of HYPERTENSION, BENIGN ESSENTIAL (03/03/2007); HYPERCHOLESTEROLEMIA; SPRAIN&STRAIN OTH SPEC SITES SHOULDER&UPPER ARM; SINUSITIS, RECURRENT; Problems with swallowing and mastication; OSTEOPOROSIS; OSTEOARTHRITIS, KNEE; OBESITY; Microscopic hematuria; LEG EDEMA, BILATERAL; HYPERGLYCEMIA; HIP PAIN, LEFT; Headache(784.0); FLANK PAIN, LEFT; FATIGUE; ESOPHAGEAL STRICTURE; CYSTOCELE WITH INCOMPLETE UTERINE PROLAPSE; CONSTIPATION; BENIGN POSITIONAL VERTIGO; ARM PAIN, RIGHT; ANXIETY; ALLERGIC RHINITIS; Carcinoma in situ of colon; Peripheral neuropathy, idiopathic; and Allergy. Also  has past surgical history that includes left heart catheterization with coronary angiogram (N/A, 12/02/2010).  Objective:   Vitals: BP 142/82 mmHg  Pulse 88  Temp(Src) 98.7 F (37.1 C) (Oral)  Resp 16  Ht 5\' 2"  (1.575 m)  Wt 195 lb 12.8 oz (88.814 kg)  BMI 35.80 kg/m2  SpO2 98%  Physical Exam  Constitutional: She is  oriented to person, place, and time. She appears well-developed and well-nourished.  HENT:  Mouth/Throat: Oropharynx is clear and moist.  Eyes: No scleral icterus.  Cardiovascular: Normal rate, regular rhythm and intact distal pulses.  Exam reveals no gallop and no friction rub.   No murmur heard. Pulmonary/Chest: No respiratory distress. She has no wheezes. She has no rales.  Abdominal: Soft. Bowel sounds are normal. She exhibits no distension and no mass. There is tenderness (upper and left-sided).  Neurological: She is alert and oriented to person, place, and time.  Skin: Skin is warm and dry.   No results found.   Results for orders placed or performed in visit on 08/09/15 (from the past 24 hour(s))  POCT CBC     Status: Abnormal   Collection Time: 08/09/15  9:49 AM  Result Value Ref Range   WBC 6.7 4.6 - 10.2 K/uL   Lymph, poc 2.9 0.6 - 3.4   POC LYMPH PERCENT 43.2 10 - 50 %L   MID (cbc) 0.4 0 - 0.9   POC MID % 6.6 0 - 12 %M   POC Granulocyte 3.4 2 - 6.9   Granulocyte percent 50.2 37 - 80 %G   RBC 4.52 4.04 - 5.48 M/uL   Hemoglobin 13.0 12.2 - 16.2 g/dL   HCT, POC 74.237.6 (A) 59.537.7 - 47.9 %   MCV 83.2 80 - 97 fL   MCH, POC 28.7 27 - 31.2 pg   MCHC 34.5 31.8 - 35.4 g/dL   RDW, POC 63.816.1 %   Platelet Count, POC 264 142 - 424 K/uL   MPV 6.9 0 - 99.8 fL  POCT urinalysis dipstick     Status: Abnormal   Collection Time: 08/09/15  9:50 AM  Result Value Ref Range   Color, UA yellow yellow   Clarity, UA hazy (A) clear   Glucose,  UA negative negative   Bilirubin, UA negative negative   Ketones, POC UA negative negative   Spec Grav, UA 1.020    Blood, UA trace-lysed (A) negative   pH, UA 6.5    Protein Ur, POC negative negative   Urobilinogen, UA 0.2    Nitrite, UA Negative Negative   Leukocytes, UA Negative Negative  POCT Microscopic Urinalysis ([facility])     Status: Abnormal   Collection Time: 08/09/15  9:50 AM  Result Value Ref Range   WBC,UR,HPF,POC Few (A) None WBC/hpf    RBC,UR,HPF,POC None None RBC/hpf   Bacteria Moderate (A) None, Too numerous to count   Mucus Absent Absent   Epithelial Cells, UR Per Microscopy Many (A) None, Too numerous to count cells/hpf   Assessment and Plan :   1. Abdominal pain, unspecified abdominal location 2. Constipation, unspecified constipation type - I suspect that her pain is due to constipation especially given patient uses hydrocodone every day for arthritic pain. I will have patient use combination of Miralax and docusate, counseled her on opioid pain medication use. She is to rtc if symptoms worsen as discussed in clinic. Radiology report pending.  3. Type 2 diabetes mellitus without complication, without long-term current use of insulin (HCC) 4. Other fatigue 5. Numbness and tingling of both legs - At the end of her visit, patient wanted to review diabetes. States that she was told she had diabetes at her last visit, has numbness and tingling. Admits that she eats unhealthy foods but also states that she does not know what to eat. I reviewed this with her and provided additional information in her visit summary.  Wallis Bamberg, PA-C [facility] [phone] 08/09/2015 9:35 AM

## 2015-08-10 ENCOUNTER — Encounter: Payer: Self-pay | Admitting: Urgent Care

## 2015-08-20 ENCOUNTER — Ambulatory Visit: Payer: Medicare Other | Admitting: Family Medicine

## 2015-08-20 NOTE — Progress Notes (Signed)
Subjective:    Patient ID: Casey [name], female    DOB: 1935-08-20, 80 y.o.   MRN: [id number]   Chief Complaint  Patient presents with  . Follow-up    HPI  Casey [name] is a 80 yo woman here to f/u on abdominal pain for which she was seen 10d prior by my colleague Urban Gibson who suspected her sxs were due to constipation from chronic hydrocodone and iron supp. She was started on miralax and docusate.  She reports her symptoms have resolved after successful constipation treatment.  DMII: new diagnosis with hgba1c 6.6 6 wks prior - trying diet changes. Nml urine microalb/Cr 09/2014 Having worsening distal lower ext neuropathy on gabapentin 300 tid.  Sees podiatry occ.  Seen by optho 04/2015.  Not on asa.  Does not eat many sweets and tries to stick to sugar free. She avoids starchy food like potatoes, pasta, white bread.  Insomnia: on xanax 1mg  qhs for decades - since 1980s.  Xanax bottle states she has 5 refills prior to 01/2016  Arthritis: Knee pain chronic but sig improved since stopping statin. Has been on hyrdocodone bid for years. Last rx for #120 given 6 wks proir.  Depression: lexapro.increased from 10 to 20 6 wks prior.  GERD with H/o esophageal stricture: On ppi. Had dilation prior by Dr. Madilyn Fireman. No current problems.  HPL: ASCVD risk increased form 18% to 26% since she came of statin but her knee pain improved so sig she does not want to restart a statin.  She is taking a fish oil supplement.  HTN: On maxzide, avoid nsaids.  Depression screen [facility] 2/9 08/21/2015 08/09/2015 07/02/2015 02/06/2015 10/11/2014  Decreased Interest 0 0 0 0 0  Down, Depressed, Hopeless 0 0 1 0 0  PHQ - 2 Score 0 0 1 0 0  Altered sleeping - - - - -  Tired, decreased energy - - - - -  Change in appetite - - - - -  Feeling bad or failure about yourself  - - - - -  Trouble concentrating - - - - -  Moving slowly or fidgety/restless - - - - -  Suicidal thoughts - - - - -  PHQ-9 Score - - - - -   Past Medical  History:  Diagnosis Date  . ALLERGIC RHINITIS   . Allergy   . ANXIETY   . ARM PAIN, RIGHT   . BENIGN POSITIONAL VERTIGO   . Carcinoma in situ of colon   . CONSTIPATION   . CYSTOCELE WITH INCOMPLETE UTERINE PROLAPSE   . ESOPHAGEAL STRICTURE   . FATIGUE   . FLANK PAIN, LEFT   . Headache(784.0)   . HIP PAIN, LEFT   . HYPERCHOLESTEROLEMIA   . HYPERGLYCEMIA   . HYPERTENSION, BENIGN ESSENTIAL 03/03/2007  . LEG EDEMA, BILATERAL   . Microscopic hematuria   . OBESITY   . OSTEOARTHRITIS, KNEE   . OSTEOPOROSIS   . Peripheral neuropathy, idiopathic   . Problems with swallowing and mastication   . SINUSITIS, RECURRENT   . SPRAIN&STRAIN OTH SPEC SITES SHOULDER&UPPER ARM    Past Surgical History:  Procedure Laterality Date  . LEFT HEART CATHETERIZATION WITH CORONARY ANGIOGRAM N/A 12/02/2010   Procedure: LEFT HEART CATHETERIZATION WITH CORONARY ANGIOGRAM;  Surgeon: Peter M Swaziland, MD;  Location: [facility] CATH LAB;  Service: Cardiovascular;  Laterality: N/A;   Current Outpatient Prescriptions on File Prior to Visit  Medication Sig Dispense Refill  . ALPRAZolam (XANAX) 1 MG tablet Take 1 tablet (  1 mg total) by mouth at bedtime. 30 tablet 5  . Cholecalciferol (VITAMIN D3) 5000 units CAPS Take 1 capsule by mouth daily.    . cyanocobalamin 500 MCG tablet Take 500 mcg by mouth daily. Reported on 07/02/2015    . docusate sodium (COLACE) 50 MG capsule Take 1 capsule (50 mg total) by mouth 2 (two) times daily. 60 capsule 0  . escitalopram (LEXAPRO) 20 MG tablet TAKE 1 TABLET BY MOUTH ONCE DAILY 30 tablet 5  . Glucose Blood (BLOOD GLUCOSE TEST STRIPS) STRP Test glucose once daily DX 250.00 50 each 11  . meclizine (ANTIVERT) 25 MG tablet Take 1 tablets by mouth UP TO 3 times a day AS NEEDED for dizziness/vertigo 90 tablet 3  . omeprazole (PRILOSEC) 20 MG capsule Take 1 capsule (20 mg total) by mouth daily. 30 capsule 5  . polyethylene glycol powder (GLYCOLAX/MIRALAX) powder Take 17 g by mouth daily as  needed. 500 g 1  . potassium chloride SA (K-DUR,KLOR-CON) 20 MEQ tablet take 1 tablet by mouth once daily 90 tablet 2  . triamterene-hydrochlorothiazide (MAXZIDE-25) 37.5-25 MG tablet TAKE ONE (1) TABLET BY  MOUTH ONCE DIALY 30 tablet 11   No current facility-administered medications on file prior to visit.    No Known Allergies Family History  Problem Relation Age of Onset  . Colon cancer    . Diabetes    . Coronary artery disease Mother 46  . Cancer Father 66  . Heart disease Father   . Coronary artery disease Sister   . Diabetes type II Sister   . Cancer Sister   . Coronary artery disease Brother   . Kidney disease Brother   . Coronary artery disease Brother    Social History   Social History  . Marital status: Single    Spouse name: N/A  . Number of children: N/A  . Years of education: N/A   Social History Main Topics  . Smoking status: Never Smoker  . Smokeless tobacco: Never Used  . Alcohol use No  . Drug use: No  . Sexual activity: Not Currently   Other Topics Concern  . Not on file   Social History Narrative  . No narrative on file     Review of Systems  Constitutional: Positive for appetite change. Negative for activity change, chills, fever and unexpected weight change.  Eyes: Negative for visual disturbance.  Respiratory: Negative for cough, choking, chest tightness and wheezing.   Cardiovascular: Positive for leg swelling. Negative for chest pain.  Gastrointestinal: Negative for abdominal distention, abdominal pain, anal bleeding, blood in stool, constipation, diarrhea, nausea and vomiting.  Musculoskeletal: Positive for arthralgias, back pain, gait problem and joint swelling. Negative for myalgias.  Skin: Negative for color change and rash.  Neurological: Positive for numbness. Negative for syncope and weakness.  Psychiatric/Behavioral: Positive for dysphoric mood and sleep disturbance. Negative for agitation, behavioral problems, confusion, decreased  concentration, hallucinations and self-injury. The patient is not nervous/anxious and is not hyperactive.        Objective:   Physical Exam  Constitutional: She is oriented to person, place, and time. She appears well-developed and well-nourished. No distress.  HENT:  Head: Normocephalic and atraumatic.  Right Ear: External ear normal.  Left Ear: External ear normal.  Eyes: Conjunctivae are normal. No scleral icterus.  Neck: Normal range of motion. Neck supple. No thyromegaly present.  Cardiovascular: Normal rate, regular rhythm, normal heart sounds and intact distal pulses.   Pulmonary/Chest: Effort normal and breath sounds  normal. No respiratory distress.  Musculoskeletal: She exhibits no edema.  Lymphadenopathy:    She has no cervical adenopathy.  Neurological: She is alert and oriented to person, place, and time.  Skin: Skin is warm and dry. She is not diaphoretic. No erythema.  Psychiatric: She has a normal mood and affect. Her behavior is normal.   Diabetic Foot Exam - Simple   Simple Foot Form Diabetic Foot exam was performed with the following findings:  Yes 08/21/2015  8:47 AM  Visual Inspection No deformities, no ulcerations, no other skin breakdown bilaterally:  Yes Sensation Testing Intact to touch and monofilament testing bilaterally:  Yes Pulse Check Posterior Tibialis and Dorsalis pulse intact bilaterally:  Yes Comments     BP 132/80 (BP Location: Right Arm, Patient Position: Sitting, Cuff Size: Large)   Pulse (!) 108   Temp 97.6 F (36.4 C) (Oral)   Resp 16   Wt 192 lb 12.8 oz (87.5 kg)   SpO2 97%   BMI 35.26 kg/m      Assessment & Plan:   Due for AWV in Jan 2018.  1. Other specified diabetes mellitus with hyperglycemia (HCC)   2. HYPERTENSION, BENIGN ESSENTIAL   3. Primary osteoarthritis of both knees   4. Anxiety state   5. HYPERCHOLESTEROLEMIA   6. Claudication of left lower extremity (HCC)     Orders Placed This Encounter  Procedures  .  Ambulatory referral to Vascular Surgery    Referral Priority:   Routine    Referral Type:   Surgical    Referral Reason:   Specialty Services Required    Requested Specialty:   Vascular Surgery    Number of Visits Requested:   1  . HM DIABETES FOOT EXAM    Meds ordered this encounter  Medications  . aspirin EC 81 MG tablet    Sig: Take 1 tablet (81 mg total) by mouth daily.  Marland Kitchen gabapentin (NEURONTIN) 300 MG capsule    Sig: take 1 capsule by mouth three times a day    Dispense:  270 capsule    Refill:  1    Office visit needed for refills  . HYDROcodone-acetaminophen (NORCO/VICODIN) 5-325 MG tablet    Sig: Take 1 tablet by mouth every 6 (six) hours as needed for moderate pain.    Dispense:  120 tablet    Refill:  0  . ipratropium (ATROVENT) 0.03 % nasal spray    Sig: instill 2 sprays into each nostril four times a day for congestion    Dispense:  30 mL    Refill:  2  . Omega-3 Fatty Acids (FISH OIL) 1000 MG CAPS    Sig: Take 2 capsules (2,000 mg total) by mouth 2 (two) times daily.    Dispense:  360 capsule    Refill:  3    [name], M.D.  [facility] [address] [place], [place] [address] [phone] phone [phone] fax  09/22/15 12:09 AM

## 2015-08-21 ENCOUNTER — Ambulatory Visit (INDEPENDENT_AMBULATORY_CARE_PROVIDER_SITE_OTHER): Payer: Medicare Other | Admitting: Family Medicine

## 2015-08-21 VITALS — BP 132/80 | HR 108 | Temp 97.6°F | Resp 16 | Wt 192.8 lb

## 2015-08-21 DIAGNOSIS — M170 Bilateral primary osteoarthritis of knee: Secondary | ICD-10-CM

## 2015-08-21 DIAGNOSIS — I739 Peripheral vascular disease, unspecified: Secondary | ICD-10-CM

## 2015-08-21 DIAGNOSIS — E7800 Pure hypercholesterolemia, unspecified: Secondary | ICD-10-CM

## 2015-08-21 DIAGNOSIS — E1365 Other specified diabetes mellitus with hyperglycemia: Secondary | ICD-10-CM | POA: Diagnosis not present

## 2015-08-21 DIAGNOSIS — F411 Generalized anxiety disorder: Secondary | ICD-10-CM

## 2015-08-21 DIAGNOSIS — I10 Essential (primary) hypertension: Secondary | ICD-10-CM

## 2015-08-21 MED ORDER — ASPIRIN EC 81 MG PO TBEC: 81.0000 mg | DELAYED_RELEASE_TABLET | Freq: Every day | ORAL | Status: DC

## 2015-08-21 MED ORDER — GABAPENTIN 300 MG PO CAPS: ORAL_CAPSULE | ORAL | 1 refills | Status: DC

## 2015-08-21 MED ORDER — HYDROCODONE-ACETAMINOPHEN 5-325 MG PO TABS: 1.0000 | ORAL_TABLET | Freq: Four times a day (QID) | ORAL | 0 refills | Status: DC | PRN

## 2015-08-21 MED ORDER — IPRATROPIUM BROMIDE 0.03 % NA SOLN: NASAL | 2 refills | Status: DC

## 2015-08-21 MED ORDER — FISH OIL 1000 MG PO CAPS: 2000.0000 mg | ORAL_CAPSULE | Freq: Two times a day (BID) | ORAL | 3 refills | Status: DC

## 2015-08-21 NOTE — Patient Instructions (Addendum)
Try to increase exercise and decrease carbohydrates in your diet - especially trying to eliminate white starches such as potatoes (sweet potatoes are ok), rice, bread, and pasta.  Eat less sugar.  Use whole grain or wheat products whenever available and try to reduce all carbohydrates to <25% of your diet.  Whenever you eat fruit, make sure it is the whole fruit (like in a smoothie) so you get fiber of the fruit (unlike in a juice where you just get the sugar of the fruit). When you eat fruit, try to choose more low glycemic index fruits (cherries, grapefruit, apricots, pears, apples, oranges, plums, strawberries, peaches, and grapes are some of the better ones for blood sugar.)   Start an aspirin EC 81 mg a day (get a huge bottle at the dollar store). Start a fish oil supplement 2 pills twice a day.   Remember to come back FASTING to have your blood checked and a complete physical in January.   Diabetes Mellitus and Food It is important for you to manage your blood sugar (glucose) level. Your blood glucose level can be greatly affected by what you eat. Eating healthier foods in the appropriate amounts throughout the day at about the same time each day will help you control your blood glucose level. It can also help slow or prevent worsening of your diabetes mellitus. Healthy eating may even help you improve the level of your blood pressure and reach or maintain a healthy weight.  General recommendations for healthful eating and cooking habits include:  Eating meals and snacks regularly. Avoid going long periods of time without eating to lose weight.  Eating a diet that consists mainly of plant-based foods, such as fruits, vegetables, nuts, legumes, and whole grains.  Using low-heat cooking methods, such as baking, instead of high-heat cooking methods, such as deep frying. Work with your dietitian to make sure you understand how to use the Nutrition Facts information on food labels. HOW CAN FOOD  AFFECT ME? Carbohydrates Carbohydrates affect your blood glucose level more than any other type of food. Your dietitian will help you determine how many carbohydrates to eat at each meal and teach you how to count carbohydrates. Counting carbohydrates is important to keep your blood glucose at a healthy level, especially if you are using insulin or taking certain medicines for diabetes mellitus. Alcohol Alcohol can cause sudden decreases in blood glucose (hypoglycemia), especially if you use insulin or take certain medicines for diabetes mellitus. Hypoglycemia can be a life-threatening condition. Symptoms of hypoglycemia (sleepiness, dizziness, and disorientation) are similar to symptoms of having too much alcohol.  If your health care provider has given you approval to drink alcohol, do so in moderation and use the following guidelines:  Women should not have more than one drink per day, and men should not have more than two drinks per day. One drink is equal to:  12 oz of beer.  5 oz of wine.  1 oz of hard liquor.  Do not drink on an empty stomach.  Keep yourself hydrated. Have water, diet soda, or unsweetened iced tea.  Regular soda, juice, and other mixers might contain a lot of carbohydrates and should be counted. WHAT FOODS ARE NOT RECOMMENDED? As you make food choices, it is important to remember that all foods are not the same. Some foods have fewer nutrients per serving than other foods, even though they might have the same number of calories or carbohydrates. It is difficult to get your body what  it needs when you eat foods with fewer nutrients. Examples of foods that you should avoid that are high in calories and carbohydrates but low in nutrients include:  Trans fats (most processed foods list trans fats on the Nutrition Facts label).  Regular soda.  Juice.  Candy.  Sweets, such as cake, pie, doughnuts, and cookies.  Fried foods. WHAT FOODS CAN I EAT? Eat nutrient-rich  foods, which will nourish your body and keep you healthy. The food you should eat also will depend on several factors, including:  The calories you need.  The medicines you take.  Your weight.  Your blood glucose level.  Your blood pressure level.  Your cholesterol level. You should eat a variety of foods, including:  Protein.  Lean cuts of meat.  Proteins low in saturated fats, such as fish, egg whites, and beans. Avoid processed meats.  Fruits and vegetables.  Fruits and vegetables that may help control blood glucose levels, such as apples, mangoes, and yams.  Dairy products.  Choose fat-free or low-fat dairy products, such as milk, yogurt, and cheese.  Grains, bread, pasta, and rice.  Choose whole grain products, such as multigrain bread, whole oats, and brown rice. These foods may help control blood pressure.  Fats.  Foods containing healthful fats, such as nuts, avocado, olive oil, canola oil, and fish. DOES EVERYONE WITH DIABETES MELLITUS HAVE THE SAME MEAL PLAN? Because every person with diabetes mellitus is different, there is not one meal plan that works for everyone. It is very important that you meet with a dietitian who will help you create a meal plan that is just right for you.   This information is not intended to replace advice given to you by your health care provider. Make sure you discuss any questions you have with your health care provider.   Document Released: 10/02/2004 Document Revised: 01/26/2014 Document Reviewed: 12/02/2012 Elsevier Interactive Patient Education [employer]

## 2015-08-29 ENCOUNTER — Other Ambulatory Visit: Payer: Self-pay | Admitting: Vascular Surgery

## 2015-08-29 DIAGNOSIS — I739 Peripheral vascular disease, unspecified: Secondary | ICD-10-CM

## 2015-10-08 ENCOUNTER — Encounter: Payer: Self-pay | Admitting: Podiatry

## 2015-10-08 ENCOUNTER — Ambulatory Visit (INDEPENDENT_AMBULATORY_CARE_PROVIDER_SITE_OTHER): Payer: Medicare Other | Admitting: Podiatry

## 2015-10-08 VITALS — BP 148/84 | HR 69 | Resp 14

## 2015-10-08 DIAGNOSIS — M79676 Pain in unspecified toe(s): Secondary | ICD-10-CM | POA: Diagnosis not present

## 2015-10-08 DIAGNOSIS — B351 Tinea unguium: Secondary | ICD-10-CM

## 2015-10-08 NOTE — Progress Notes (Signed)
Patient ID: Casey GaussAda B [name], female   DOB: January 23, 1935, 80 y.o.   MRN: [id number]    Subjective: This patient presents again complaining of painful toenails and requests toenail debridement. Last visit for a similar service was on 05/08/2014  Objective: Orientated 3 DP and PT pulses 2/4 bilaterally No open skin lesions bilaterally The toenails are elongated, hypertrophic, incurvated, discolored and tender to palpation 6-10  Assessment: Symptomatic onychomycoses 6-10 Prediabetic  Plan: Debridement of toenails 10 with slight being distal second left toe treated with topical antibiotic ointment and Band-Aid. Removed Band-Aid 1-3 days and apply topical antibiotic ointment and Band-Aid until a scab forms  Reappoint 3 months

## 2015-10-08 NOTE — Patient Instructions (Signed)
Removed Band-Aid on the second left toe in 1-3 days and apply topical antibiotic ointment daily until a scab forms  Diabetes and Foot Care Diabetes may cause you to have problems because of poor blood supply (circulation) to your feet and legs. This may cause the skin on your feet to become thinner, break easier, and heal more slowly. Your skin may become dry, and the skin may peel and crack. You may also have nerve damage in your legs and feet causing decreased feeling in them. You may not notice minor injuries to your feet that could lead to infections or more serious problems. Taking care of your feet is one of the most important things you can do for yourself.  HOME CARE INSTRUCTIONS  Wear shoes at all times, even in the house. Do not go barefoot. Bare feet are easily injured.  Check your feet daily for blisters, cuts, and redness. If you cannot see the bottom of your feet, use a mirror or ask someone for help.  Wash your feet with warm water (do not use hot water) and mild soap. Then pat your feet and the areas between your toes until they are completely dry. Do not soak your feet as this can dry your skin.  Apply a moisturizing lotion or petroleum jelly (that does not contain alcohol and is unscented) to the skin on your feet and to dry, brittle toenails. Do not apply lotion between your toes.  Trim your toenails straight across. Do not dig under them or around the cuticle. File the edges of your nails with an emery board or nail file.  Do not cut corns or calluses or try to remove them with medicine.  Wear clean socks or stockings every day. Make sure they are not too tight. Do not wear knee-high stockings since they may decrease blood flow to your legs.  Wear shoes that fit properly and have enough cushioning. To break in new shoes, wear them for just a few hours a day. This prevents you from injuring your feet. Always look in your shoes before you put them on to be sure there are no  objects inside.  Do not cross your legs. This may decrease the blood flow to your feet.  If you find a minor scrape, cut, or break in the skin on your feet, keep it and the skin around it clean and dry. These areas may be cleansed with mild soap and water. Do not cleanse the area with peroxide, alcohol, or iodine.  When you remove an adhesive bandage, be sure not to damage the skin around it.  If you have a wound, look at it several times a day to make sure it is healing.  Do not use heating pads or hot water bottles. They may burn your skin. If you have lost feeling in your feet or legs, you may not know it is happening until it is too late.  Make sure your health care provider performs a complete foot exam at least annually or more often if you have foot problems. Report any cuts, sores, or bruises to your health care provider immediately. SEEK MEDICAL CARE IF:   You have an injury that is not healing.  You have cuts or breaks in the skin.  You have an ingrown nail.  You notice redness on your legs or feet.  You feel burning or tingling in your legs or feet.  You have pain or cramps in your legs and feet.  Your legs  or feet are numb.  Your feet always feel cold. SEEK IMMEDIATE MEDICAL CARE IF:   There is increasing redness, swelling, or pain in or around a wound.  There is a red line that goes up your leg.  Pus is coming from a wound.  You develop a fever or as directed by your health care provider.  You notice a bad smell coming from an ulcer or wound.   This information is not intended to replace advice given to you by your health care provider. Make sure you discuss any questions you have with your health care provider.   Document Released: 01/03/2000 Document Revised: 09/07/2012 Document Reviewed: 06/14/2012 Elsevier Interactive Patient Education [employer].

## 2015-10-22 ENCOUNTER — Encounter: Payer: Self-pay | Admitting: Vascular Surgery

## 2015-10-24 ENCOUNTER — Encounter (HOSPITAL_COMMUNITY): Payer: Medicare Other

## 2015-10-24 ENCOUNTER — Encounter: Payer: Medicare Other | Admitting: Vascular Surgery

## 2015-11-04 ENCOUNTER — Encounter: Payer: Self-pay | Admitting: Vascular Surgery

## 2015-11-08 ENCOUNTER — Encounter: Payer: Medicare Other | Admitting: Vascular Surgery

## 2015-11-08 ENCOUNTER — Encounter (HOSPITAL_COMMUNITY): Payer: Medicare Other

## 2015-12-17 ENCOUNTER — Other Ambulatory Visit: Payer: Self-pay | Admitting: Family Medicine

## 2015-12-19 ENCOUNTER — Other Ambulatory Visit: Payer: Self-pay | Admitting: Family Medicine

## 2015-12-19 NOTE — Telephone Encounter (Signed)
2017 last ov

## 2015-12-22 NOTE — Telephone Encounter (Signed)
08/2015 last ov 07/2015 last lab 

## 2015-12-24 ENCOUNTER — Ambulatory Visit (INDEPENDENT_AMBULATORY_CARE_PROVIDER_SITE_OTHER): Payer: Medicare Other | Admitting: Family Medicine

## 2015-12-24 VITALS — BP 158/80 | HR 88 | Temp 98.4°F | Resp 16 | Ht 62.0 in | Wt 210.4 lb

## 2015-12-24 DIAGNOSIS — J019 Acute sinusitis, unspecified: Secondary | ICD-10-CM

## 2015-12-24 DIAGNOSIS — Z23 Encounter for immunization: Secondary | ICD-10-CM | POA: Diagnosis not present

## 2015-12-24 DIAGNOSIS — R42 Dizziness and giddiness: Secondary | ICD-10-CM | POA: Diagnosis not present

## 2015-12-24 DIAGNOSIS — H8110 Benign paroxysmal vertigo, unspecified ear: Secondary | ICD-10-CM | POA: Diagnosis not present

## 2015-12-24 DIAGNOSIS — R110 Nausea: Secondary | ICD-10-CM | POA: Diagnosis not present

## 2015-12-24 DIAGNOSIS — R079 Chest pain, unspecified: Secondary | ICD-10-CM

## 2015-12-24 DIAGNOSIS — R51 Headache: Secondary | ICD-10-CM

## 2015-12-24 DIAGNOSIS — I4581 Long QT syndrome: Secondary | ICD-10-CM | POA: Diagnosis not present

## 2015-12-24 DIAGNOSIS — Z5181 Encounter for therapeutic drug level monitoring: Secondary | ICD-10-CM | POA: Diagnosis not present

## 2015-12-24 DIAGNOSIS — R5382 Chronic fatigue, unspecified: Secondary | ICD-10-CM | POA: Diagnosis not present

## 2015-12-24 DIAGNOSIS — E1142 Type 2 diabetes mellitus with diabetic polyneuropathy: Secondary | ICD-10-CM | POA: Diagnosis not present

## 2015-12-24 DIAGNOSIS — J329 Chronic sinusitis, unspecified: Secondary | ICD-10-CM | POA: Diagnosis not present

## 2015-12-24 DIAGNOSIS — F411 Generalized anxiety disorder: Secondary | ICD-10-CM

## 2015-12-24 DIAGNOSIS — R519 Headache, unspecified: Secondary | ICD-10-CM

## 2015-12-24 DIAGNOSIS — K219 Gastro-esophageal reflux disease without esophagitis: Secondary | ICD-10-CM

## 2015-12-24 LAB — POC MICROSCOPIC URINALYSIS (UMFC): MUCUS RE: ABSENT

## 2015-12-24 LAB — POCT CBC
Granulocyte percent: 54.3 %G (ref 37–80)
HCT, POC: 35.3 % — AB (ref 37.7–47.9)
Hemoglobin: 12.1 g/dL — AB (ref 12.2–16.2)
LYMPH, POC: 2.4 (ref 0.6–3.4)
MCH, POC: 28.3 pg (ref 27–31.2)
MCHC: 34.2 g/dL (ref 31.8–35.4)
MCV: 82.6 fL (ref 80–97)
MID (CBC): 0.6 (ref 0–0.9)
MPV: 6.8 fL (ref 0–99.8)
PLATELET COUNT, POC: 249 10*3/uL (ref 142–424)
POC Granulocyte: 3.6 (ref 2–6.9)
POC LYMPH %: 37.1 % (ref 10–50)
POC MID %: 8.6 %M (ref 0–12)
RBC: 4.28 M/uL (ref 4.04–5.48)
RDW, POC: 15.3 %
WBC: 6.6 10*3/uL (ref 4.6–10.2)

## 2015-12-24 LAB — POCT URINALYSIS DIP (MANUAL ENTRY)
Bilirubin, UA: NEGATIVE
GLUCOSE UA: NEGATIVE
Ketones, POC UA: NEGATIVE
LEUKOCYTES UA: NEGATIVE
Nitrite, UA: NEGATIVE
PROTEIN UA: NEGATIVE
SPEC GRAV UA: 1.015
UROBILINOGEN UA: 1
pH, UA: 8

## 2015-12-24 LAB — POCT SEDIMENTATION RATE: POCT SED RATE: 15 mm/hr (ref 0–22)

## 2015-12-24 LAB — POCT GLYCOSYLATED HEMOGLOBIN (HGB A1C): Hemoglobin A1C: 6.7

## 2015-12-24 MED ORDER — OMEPRAZOLE 20 MG PO CPDR: 20.0000 mg | DELAYED_RELEASE_CAPSULE | Freq: Two times a day (BID) | ORAL | 3 refills | Status: DC

## 2015-12-24 MED ORDER — AMOXICILLIN 875 MG PO TABS: 875.0000 mg | ORAL_TABLET | Freq: Two times a day (BID) | ORAL | 0 refills | Status: DC

## 2015-12-24 MED ORDER — ESCITALOPRAM OXALATE 20 MG PO TABS: ORAL_TABLET | ORAL | 5 refills | Status: DC

## 2015-12-24 NOTE — Patient Instructions (Addendum)
IF you received an x-ray today, you will receive an invoice from [facility]. Please contact [facility] at [phone] with questions or concerns regarding your invoice.   IF you received labwork today, you will receive an invoice from [facility]. Please contact [facility] at [phone] with questions or concerns regarding your invoice.   Our billing staff will not be able to assist you with questions regarding bills from these companies.  You will be contacted with the lab results as soon as they are available. The fastest way to get your results is to activate your My Chart account. Instructions are located on the last page of this paperwork. If you have not heard from us regarding the results in 2 weeks, please contact this office.     Start the amoxicillin twice a day for a sinus infection. Increase your omeprazole to twice a day  - 30 min before breakfast and dinner. Take the meclizine two to three times a day.  Recheck in 3 to 4 days with me.     Food Choices to Help Relieve Diarrhea, Adult When you have diarrhea, the foods you eat and your eating habits are very important. Choosing the right foods and drinks can help relieve diarrhea. Also, because diarrhea can last up to 7 days, you need to replace lost fluids and electrolytes (such as sodium, potassium, and chloride) in order to help prevent dehydration. What general guidelines do I need to follow?  Slowly drink 1 cup (8 oz) of fluid for each episode of diarrhea. If you are getting enough fluid, your urine will be clear or pale yellow.  Eat starchy foods. Some good choices include white rice, white toast, pasta, low-fiber cereal, baked potatoes (without the skin), saltine crackers, and bagels.  Avoid large servings of any cooked vegetables.  Limit fruit to two servings per day. A serving is  cup or 1 small piece.  Choose foods with less than 2 g of fiber per  serving.  Limit fats to less than 8 tsp (38 g) per day.  Avoid fried foods.  Eat foods that have probiotics in them. Probiotics can be found in certain dairy products.  Avoid foods and beverages that may increase the speed at which food moves through the stomach and intestines (gastrointestinal tract). Things to avoid include:  High-fiber foods, such as dried fruit, raw fruits and vegetables, nuts, seeds, and whole grain foods.  Spicy foods and high-fat foods.  Foods and beverages sweetened with high-fructose corn syrup, honey, or sugar alcohols such as xylitol, sorbitol, and mannitol. What foods are recommended? Grains  White rice. White, [place], or pita breads (fresh or toasted), including plain rolls, buns, or bagels. White pasta. Saltine, soda, or graham crackers. Pretzels. Low-fiber cereal. Cooked cereals made with water (such as cornmeal, farina, or cream cereals). Plain muffins. Matzo. Melba toast. Zwieback. Vegetables  Potatoes (without the skin). Strained tomato and vegetable juices. Most well-cooked and canned vegetables without seeds. Tender lettuce. Fruits  Cooked or canned applesauce, apricots, cherries, fruit cocktail, grapefruit, peaches, pears, or plums. Fresh bananas, apples without skin, cherries, grapes, cantaloupe, grapefruit, peaches, oranges, or plums. Meat and Other Protein Products  Baked or boiled chicken. Eggs. Tofu. Fish. Seafood. Smooth peanut butter. Ground or well-cooked tender beef, ham, veal, lamb, pork, or poultry. Dairy  Plain yogurt, kefir, and unsweetened liquid yogurt. Lactose-free milk, buttermilk, or soy milk. Plain hard cheese. Beverages  Sport drinks. Clear broths. Diluted fruit juices (except prune). Regular, caffeine-free  sodas such as ginger ale. Water. Decaffeinated teas. Oral rehydration solutions. Sugar-free beverages not sweetened with sugar alcohols. Other  Bouillon, broth, or soups made from recommended foods. The items listed above may  not be a complete list of recommended foods or beverages. Contact your dietitian for more options.  What foods are not recommended? Grains  Whole grain, whole wheat, bran, or rye breads, rolls, pastas, crackers, and cereals. Wild or brown rice. Cereals that contain more than 2 g of fiber per serving. Corn tortillas or taco shells. Cooked or dry oatmeal. Granola. Popcorn. Vegetables  Raw vegetables. Cabbage, broccoli, Brussels sprouts, artichokes, baked beans, beet greens, corn, kale, legumes, peas, sweet potatoes, and yams. Potato skins. Cooked spinach and cabbage. Fruits  Dried fruit, including raisins and dates. Raw fruits. Stewed or dried prunes. Fresh apples with skin, apricots, mangoes, pears, raspberries, and strawberries. Meat and Other Protein Products  Chunky peanut butter. Nuts and seeds. Beans and lentils. Tomasa BlaseBacon. Dairy  High-fat cheeses. Milk, chocolate milk, and beverages made with milk, such as milk shakes. Cream. Ice cream. Sweets and Desserts  Sweet rolls, doughnuts, and sweet breads. Pancakes and waffles. Fats and Oils  Butter. Cream sauces. Margarine. Salad oils. Plain salad dressings. Olives. Avocados. Beverages  Caffeinated beverages (such as coffee, tea, soda, or energy drinks). Alcoholic beverages. Fruit juices with pulp. Prune juice. Soft drinks sweetened with high-fructose corn syrup or sugar alcohols. Other  Coconut. Hot sauce. Chili powder. Mayonnaise. Gravy. Cream-based or milk-based soups. The items listed above may not be a complete list of foods and beverages to avoid. Contact your dietitian for more information.  What should I do if I become dehydrated? Diarrhea can sometimes lead to dehydration. Signs of dehydration include dark urine and dry mouth and skin. If you think you are dehydrated, you should rehydrate with an oral rehydration solution. These solutions can be purchased at pharmacies, retail stores, or online. Drink -1 cup (120-240 mL) of oral  rehydration solution each time you have an episode of diarrhea. If drinking this amount makes your diarrhea worse, try drinking smaller amounts more often. For example, drink 1-3 tsp (5-15 mL) every 5-10 minutes. A general rule for staying hydrated is to drink 1-2 L of fluid per day. Talk to your health care provider about the specific amount you should be drinking each day. Drink enough fluids to keep your urine clear or pale yellow. This information is not intended to replace advice given to you by your health care provider. Make sure you discuss any questions you have with your health care provider. Document Released: 03/28/2003 Document Revised: 06/13/2015 Document Reviewed: 11/28/2012 Elsevier Interactive Patient Education  2017 [employer]

## 2015-12-24 NOTE — Progress Notes (Addendum)
By signing my name below, I, Mesha Guinyard, attest that this documentation has been prepared under the direction and in the presence of [name], MD.  Electronically Signed: Arvilla Market, Medical Scribe. 12/24/15. 9:11 AM.  Subjective:    Patient ID: Casey [name], female    DOB: 1935-02-21, 80 y.o.   MRN: [id number]  HPI Chief Complaint  Patient presents with  . Headache    left eye pain x 2 weeks, sore throat, ear ache left side    HPI Comments: Casey [name] is a 80 y.o. female who presents to the [facility] complaining of HA located around her bilateral temples and left eye onset 2 weeks. She did have new dx of DM this summer, she does not have a follow-up appt scheduled. See's ophtho Dr. Nile Riggs; appt in April 2018. Pt had some candy this morning.  Reports associated sxs of pain radiating to her neck, left eye pain, weakness since this morning, dizziness, light-headedness, vision changes, sore throat, and left earache. Pt stumbles when she walks and mentions people think she's drunk when she walks to her mail box. She will have chest pain when she lifts heavy objects. Took alka-seltzer plus and ear drops without relief to her sxs. She still takes the meclizine for her vertigo. Denies recent falls, watery eyes, photophobia, and emesis.  GI: Reports nausea, abdominal pain, and diarrhea for months. She has upset stomach every time she eats something and she immediately goes to the bathroom.  Legs: Pt states her legs no longer hurts so she never had it checked out.  Patient Active Problem List   Diagnosis Date Noted  . Prediabetes 03/11/2014  . DJD (degenerative joint disease) of knee 07/07/2013  . Vitamin D deficiency disease 10/23/2011  . Anemia 10/23/2011  . Encounter for medication monitoring 10/23/2011  . Hyponatremia 12/01/2010    Class: Acute  . Chest pain 11/26/2010    Class: Acute  . Long Q-T syndrome 11/26/2010  . CYSTOCELE WITH INCOMPLETE  UTERINE PROLAPSE 03/10/2010  . HYPERGLYCEMIA 02/06/2010  . ESOPHAGEAL STRICTURE 12/11/2009  . Obesity 12/09/2009  . PROBLEMS WITH SWALLOWING AND MASTICATION 12/09/2009  . MICROSCOPIC HEMATURIA 07/29/2009  . ARM PAIN, RIGHT 03/28/2009  . Fatigue 03/28/2009  . CONSTIPATION 09/25/2008  . SINUSITIS, RECURRENT 02/23/2008  . Osteoarthrosis, unspecified whether generalized or localized, involving lower leg 09/28/2007  . LEG EDEMA, BILATERAL 08/31/2007  . FLANK PAIN, LEFT 06/08/2007  . SPRAIN&STRAIN OTH SPEC SITES SHOULDER&UPPER ARM 06/08/2007  . BENIGN POSITIONAL VERTIGO 04/25/2007  . HEADACHE 03/24/2007  . HYPERCHOLESTEROLEMIA 03/03/2007  . Anxiety state 03/03/2007  . HYPERTENSION, BENIGN ESSENTIAL 03/03/2007  . ALLERGIC RHINITIS 03/03/2007  . HIP PAIN, LEFT 03/03/2007  . Osteoporosis 03/03/2007   Past Medical History:  Diagnosis Date  . ALLERGIC RHINITIS   . Allergy   . ANXIETY   . ARM PAIN, RIGHT   . BENIGN POSITIONAL VERTIGO   . Carcinoma in situ of colon   . CONSTIPATION   . CYSTOCELE WITH INCOMPLETE UTERINE PROLAPSE   . ESOPHAGEAL STRICTURE   . FATIGUE   . FLANK PAIN, LEFT   . Headache(784.0)   . HIP PAIN, LEFT   . HYPERCHOLESTEROLEMIA   . HYPERGLYCEMIA   . HYPERTENSION, BENIGN ESSENTIAL 03/03/2007  . LEG EDEMA, BILATERAL   . Microscopic hematuria   . OBESITY   . OSTEOARTHRITIS, KNEE   . OSTEOPOROSIS   . Peripheral neuropathy, idiopathic   . Problems with swallowing and mastication   .  SINUSITIS, RECURRENT   . SPRAIN&STRAIN OTH SPEC SITES SHOULDER&UPPER ARM    Past Surgical History:  Procedure Laterality Date  . LEFT HEART CATHETERIZATION WITH CORONARY ANGIOGRAM N/A 12/02/2010   Procedure: LEFT HEART CATHETERIZATION WITH CORONARY ANGIOGRAM;  Surgeon: Peter M Swaziland, MD;  Location[facility] CATH LAB;  Service: Cardiovascular;  Laterality: N/A;   No Known Allergies Prior to Admission medications   Medication Sig Start Date End Date Taking? Authorizing Provider    ALPRAZolam Prudy Feeler) 1 MG tablet Take 1 tablet (1 mg total) by mouth at bedtime. 07/02/15  Yes Emi Belfast, FNP  Cholecalciferol (VITAMIN D3) 5000 units CAPS Take 1 capsule by mouth daily.   Yes Historical Provider, MD  escitalopram (LEXAPRO) 20 MG tablet TAKE 1 TABLET BY MOUTH ONCE DAILY 07/02/15  Yes Emi Belfast, FNP  gabapentin (NEURONTIN) 300 MG capsule take 1 capsule by mouth three times a day 08/21/15  Yes Sherren Mocha, MD  Glucose Blood (BLOOD GLUCOSE TEST STRIPS) STRP Test glucose once daily DX 250.00 12/28/14  Yes Sherren Mocha, MD  HYDROcodone-acetaminophen (NORCO/VICODIN) 5-325 MG tablet Take 1 tablet by mouth every 6 (six) hours as needed for moderate pain. 08/21/15  Yes Sherren Mocha, MD  meclizine (ANTIVERT) 25 MG tablet Take 1 tablets by mouth UP TO 3 times a day AS NEEDED for dizziness/vertigo 08/01/15  Yes Sherren Mocha, MD  omeprazole (PRILOSEC) 20 MG capsule Take 1 capsule (20 mg total) by mouth daily. 07/02/15  Yes Emi Belfast, FNP  potassium chloride SA (K-DUR,KLOR-CON) 20 MEQ tablet take 1 tablet by mouth once daily 05/01/15  Yes Sherren Mocha, MD  triamterene-hydrochlorothiazide [facility]) 37.5-25 MG tablet take 1 tablet by mouth once daily 12/19/15  Yes Sherren Mocha, MD  aspirin EC 81 MG tablet Take 1 tablet (81 mg total) by mouth daily. Patient not taking: Reported on 12/24/2015 08/21/15   Sherren Mocha, MD  cyanocobalamin 500 MCG tablet Take 500 mcg by mouth daily. Reported on 07/02/2015    Historical Provider, MD  docusate sodium (COLACE) 50 MG capsule Take 1 capsule (50 mg total) by mouth 2 (two) times daily. Patient not taking: Reported on 12/24/2015 08/09/15   Wallis Bamberg, PA-C  ipratropium (ATROVENT) 0.03 % nasal spray INSTILL 2 SPRAYS INTO EACH NOSTRIL 4 TIMES DAILY FOR CONGESTION. Patient not taking: Reported on 12/24/2015 12/22/15   Sherren Mocha, MD  Omega-3 Fatty Acids (FISH OIL) 1000 MG CAPS Take 2 capsules (2,000 mg total) by mouth 2 (two) times daily. Patient not taking: Reported  on 12/24/2015 08/21/15   Sherren Mocha, MD  polyethylene glycol powder (GLYCOLAX/MIRALAX) powder Take 17 g by mouth daily as needed. Patient not taking: Reported on 12/24/2015 08/09/15   Wallis Bamberg, PA-C   Social History   Social History  . Marital status: Single    Spouse name: N/A  . Number of children: N/A  . Years of education: N/A   Occupational History  . Not on file.   Social History Main Topics  . Smoking status: Never Smoker  . Smokeless tobacco: Never Used  . Alcohol use No  . Drug use: No  . Sexual activity: Not Currently   Other Topics Concern  . Not on file   Social History Narrative  . No narrative on file   Review of Systems  HENT: Positive for ear pain and sore throat.   Eyes: Positive for pain and visual disturbance. Negative for photophobia and discharge.  Cardiovascular: Positive  for chest pain (when lifting heavy objects).  Gastrointestinal: Positive for abdominal pain, diarrhea and nausea. Negative for vomiting.  Musculoskeletal: Positive for neck pain.  Neurological: Positive for dizziness, weakness, light-headedness and headaches.   Objective:  Physical Exam  Constitutional: She appears well-developed and well-nourished. No distress.  HENT:  Head: Normocephalic and atraumatic.  Right Ear: Tympanic membrane is injected and retracted.  Left Ear: Tympanic membrane normal.  Mouth/Throat: Oropharynx is clear and moist.  Nares and oropharynx nl No point tenderness of the temporal arteries  Eyes: Conjunctivae are normal.  Neck: Neck supple.  Cardiovascular: Normal rate.   Pulmonary/Chest: Effort normal.  Musculoskeletal: She exhibits edema (trace pedal).  Neurological: She is alert. She displays a negative Romberg sign.  negative pronator drift  Skin: Skin is warm and dry.  Psychiatric: She has a normal mood and affect. Her behavior is normal.  Nursing note and vitals reviewed.  BP (!) 158/80 (BP Location: Right Arm, Patient Position: Sitting, Cuff  Size: Large)   Pulse 88   Temp 98.4 F (36.9 C) (Oral)   Resp 16   Ht 5\' 2"  (1.575 m)   Wt 210 lb 6.4 oz (95.4 kg)   SpO2 97%   BMI 38.48 kg/m    [EKG Reading]: QT of 408. flipped T-waves lead 1 with poor R-wave progression over chest leads, but otherwise unchanged from prior and decreased voltage with 06/06/2013  Results for orders placed or performed in visit on 12/24/15  POCT CBC  Result Value Ref Range   WBC 6.6 4.6 - 10.2 K/uL   Lymph, poc 2.4 0.6 - 3.4   POC LYMPH PERCENT 37.1 10 - 50 %L   MID (cbc) 0.6 0 - 0.9   POC MID % 8.6 0 - 12 %M   POC Granulocyte 3.6 2 - 6.9   Granulocyte percent 54.3 37 - 80 %G   RBC 4.28 4.04 - 5.48 M/uL   Hemoglobin 12.1 (A) 12.2 - 16.2 g/dL   HCT, POC 82.9 (A) 56.2 - 47.9 %   MCV 82.6 80 - 97 fL   MCH, POC 28.3 27 - 31.2 pg   MCHC 34.2 31.8 - 35.4 g/dL   RDW, POC 13.0 %   Platelet Count, POC 249 142 - 424 K/uL   MPV 6.8 0 - 99.8 fL  POCT urinalysis dipstick  Result Value Ref Range   Color, UA yellow yellow   Clarity, UA clear clear   Glucose, UA negative negative   Bilirubin, UA negative negative   Ketones, POC UA negative negative   Spec Grav, UA 1.015    Blood, UA trace-intact (A) negative   pH, UA 8.0    Protein Ur, POC negative negative   Urobilinogen, UA 1.0    Nitrite, UA Negative Negative   Leukocytes, UA Negative Negative  POCT Microscopic Urinalysis ([facility])  Result Value Ref Range   WBC,UR,HPF,POC None None WBC/hpf   RBC,UR,HPF,POC Few (A) None RBC/hpf   Bacteria None None, Too numerous to count   Mucus Absent Absent   Epithelial Cells, UR Per Microscopy Few (A) None, Too numerous to count cells/hpf  POCT SEDIMENTATION RATE  Result Value Ref Range   POCT SED RATE 15 0 - 22 mm/hr  POCT glycosylated hemoglobin (Hb A1C)  Result Value Ref Range   Hemoglobin A1C 6.7    Assessment & Plan:  Refill potassium and maxide after the labs come back. Okay to refill alprazolam, gabapentin, and meclizine when requested. 1. Acute  non-recurrent sinusitis, unspecified  location   2. Need for prophylactic vaccination and inoculation against influenza   3. Dizziness and giddiness   4. Nausea without vomiting   5. Chest pain, unspecified type   6. Long Q-T syndrome   7. Chronic sinusitis, unspecified location   8. Benign paroxysmal positional vertigo, unspecified laterality   9. Acute nonintractable headache, unspecified headache type   10. Chronic fatigue   11. Encounter for medication monitoring   12. Type 2 diabetes mellitus with diabetic polyneuropathy, without long-term current use of insulin (HCC)   13. Gastroesophageal reflux disease, esophagitis presence not specified   14. Anxiety state     Orders Placed This Encounter  Procedures  . Flu Vaccine QUAD 36+ mos PF IM (Fluarix & Fluzone Quad PF)  . Comprehensive metabolic panel  . Lipid panel    Order Specific Question:   Has the patient fasted?    Answer:   Yes  . Microalbumin/Creatinine Ratio, Urine  . Orthostatic vital signs  . POCT CBC  . POCT urinalysis dipstick  . POCT Microscopic Urinalysis ([facility])  . POCT SEDIMENTATION RATE  . POCT glycosylated hemoglobin (Hb A1C)  . EKG 12-Lead    Meds ordered this encounter  Medications  . amoxicillin (AMOXIL) 875 MG tablet    Sig: Take 1 tablet (875 mg total) by mouth 2 (two) times daily.    Dispense:  20 tablet    Refill:  0  . omeprazole (PRILOSEC) 20 MG capsule    Sig: Take 1 capsule (20 mg total) by mouth 2 (two) times daily before a meal.    Dispense:  60 capsule    Refill:  3  . escitalopram (LEXAPRO) 20 MG tablet    Sig: TAKE 1 TABLET BY MOUTH ONCE DAILY    Dispense:  30 tablet    Refill:  5   Over 40 min spent in face-to-face evaluation of and consultation with patient and coordination of care.  Over 50% of this time was spent counseling this patient.   I personally performed the services described in this documentation, which was scribed in my presence. The recorded information has been  reviewed and considered, and addended by me as needed.   [name], M.D.  [facility] [address] [place], [place] [address] [phone] phone [phone] fax  12/27/15 2:40 PM

## 2015-12-25 LAB — MICROALBUMIN / CREATININE URINE RATIO
CREATININE, UR: 63.7 mg/dL
MICROALB/CREAT RATIO: 46 mg/g{creat} — AB (ref 0.0–30.0)
MICROALBUM., U, RANDOM: 29.3 ug/mL

## 2015-12-25 LAB — LIPID PANEL
CHOL/HDL RATIO: 3.1 ratio (ref 0.0–4.4)
Cholesterol, Total: 224 mg/dL — ABNORMAL HIGH (ref 100–199)
HDL: 72 mg/dL (ref >39)
LDL CALC: 139 mg/dL — AB (ref 0–99)
Triglycerides: 63 mg/dL (ref 0–149)
VLDL Cholesterol Cal: 13 mg/dL (ref 5–40)

## 2015-12-27 ENCOUNTER — Ambulatory Visit (INDEPENDENT_AMBULATORY_CARE_PROVIDER_SITE_OTHER): Payer: Medicare Other | Admitting: Family Medicine

## 2015-12-27 VITALS — BP 130/74 | HR 78 | Temp 98.2°F | Resp 18 | Ht 62.0 in | Wt 211.0 lb

## 2015-12-27 DIAGNOSIS — M170 Bilateral primary osteoarthritis of knee: Secondary | ICD-10-CM | POA: Diagnosis not present

## 2015-12-27 DIAGNOSIS — R809 Proteinuria, unspecified: Secondary | ICD-10-CM | POA: Diagnosis not present

## 2015-12-27 DIAGNOSIS — E1129 Type 2 diabetes mellitus with other diabetic kidney complication: Secondary | ICD-10-CM

## 2015-12-27 DIAGNOSIS — I10 Essential (primary) hypertension: Secondary | ICD-10-CM

## 2015-12-27 DIAGNOSIS — J0110 Acute frontal sinusitis, unspecified: Secondary | ICD-10-CM | POA: Diagnosis not present

## 2015-12-27 MED ORDER — HYDROCODONE-ACETAMINOPHEN 5-325 MG PO TABS
1.0000 | ORAL_TABLET | Freq: Four times a day (QID) | ORAL | 0 refills | Status: DC | PRN
Start: 2015-12-27 — End: 2016-01-02

## 2015-12-27 MED ORDER — LISINOPRIL 40 MG PO TABS: 40.0000 mg | ORAL_TABLET | Freq: Every day | ORAL | 0 refills | Status: DC

## 2015-12-27 NOTE — Progress Notes (Signed)
Subjective:  By signing my name below, I, Casey [name], attest that this documentation has been prepared under the direction and in the presence of Casey SorensonEva Shaw, MD Electronically Signed: Charline BillsEssence [name], ED Scribe 12/27/2015 at 2:16 PM.   Patient ID: Casey GaussAda B [name], female    DOB: 1935/12/12, 80 y.o.   MRN: [id number] Chief Complaint  Patient presents with  . Follow-up    sinus head   HPI HPI Comments: Casey Gaussda B Shedlock is a 80 y.o. female who presents to the [facility] for a follow-up regarding HA. Pt states that her HA has improved significantly since her last visit 3 days ago. Her HgA1C was 6.7 3 days ago. Pt's triage BP: 130/74. She is agreeable to starting a new medication for HTN at this visit.   Pt requests a refill of Norco at this visit for arthritis. She reports side effects of dizziness and fatigue but states that she takes the medication prior to bed.   Past Medical History:  Diagnosis Date  . ALLERGIC RHINITIS   . Allergy   . ANXIETY   . ARM PAIN, RIGHT   . BENIGN POSITIONAL VERTIGO   . Carcinoma in situ of colon   . CONSTIPATION   . CYSTOCELE WITH INCOMPLETE UTERINE PROLAPSE   . ESOPHAGEAL STRICTURE   . FATIGUE   . FLANK PAIN, LEFT   . Headache(784.0)   . HIP PAIN, LEFT   . HYPERCHOLESTEROLEMIA   . HYPERGLYCEMIA   . HYPERTENSION, BENIGN ESSENTIAL 03/03/2007  . LEG EDEMA, BILATERAL   . Microscopic hematuria   . OBESITY   . OSTEOARTHRITIS, KNEE   . OSTEOPOROSIS   . Peripheral neuropathy, idiopathic   . Problems with swallowing and mastication   . SINUSITIS, RECURRENT   . SPRAIN&STRAIN OTH SPEC SITES SHOULDER&UPPER ARM    Current Outpatient Prescriptions on File Prior to Visit  Medication Sig Dispense Refill  . ALPRAZolam (XANAX) 1 MG tablet Take 1 tablet (1 mg total) by mouth at bedtime. 30 tablet 5  . amoxicillin (AMOXIL) 875 MG tablet Take 1 tablet (875 mg total) by mouth 2 (two) times daily. 20 tablet 0  . aspirin EC 81 MG tablet Take 1  tablet (81 mg total) by mouth daily.    . Cholecalciferol (VITAMIN D3) 5000 units CAPS Take 1 capsule by mouth daily.    . cyanocobalamin 500 MCG tablet Take 500 mcg by mouth daily. Reported on 07/02/2015    . escitalopram (LEXAPRO) 20 MG tablet TAKE 1 TABLET BY MOUTH ONCE DAILY 30 tablet 5  . gabapentin (NEURONTIN) 300 MG capsule take 1 capsule by mouth three times a day 270 capsule 1  . Glucose Blood (BLOOD GLUCOSE TEST STRIPS) STRP Test glucose once daily DX 250.00 50 each 11  . HYDROcodone-acetaminophen (NORCO/VICODIN) 5-325 MG tablet Take 1 tablet by mouth every 6 (six) hours as needed for moderate pain. 120 tablet 0  . meclizine (ANTIVERT) 25 MG tablet Take 1 tablets by mouth UP TO 3 times a day AS NEEDED for dizziness/vertigo 90 tablet 3  . omeprazole (PRILOSEC) 20 MG capsule Take 1 capsule (20 mg total) by mouth 2 (two) times daily before a meal. 60 capsule 3  . potassium chloride SA (K-DUR,KLOR-CON) 20 MEQ tablet take 1 tablet by mouth once daily 90 tablet 2  . triamterene-hydrochlorothiazide (MAXZIDE-25) 37.5-25 MG tablet take 1 tablet by mouth once daily 30 tablet 1   No current facility-administered medications on file prior to visit.  No Known Allergies  Review of Systems  Neurological: Negative for headaches.      Objective:   Physical Exam  Constitutional: She is oriented to person, place, and time. She appears well-developed and well-nourished. No distress.  HENT:  Head: Normocephalic and atraumatic.  Eyes: Conjunctivae and EOM are normal.  Neck: Neck supple. No tracheal deviation present.  Cardiovascular: Normal rate, regular rhythm and normal heart sounds.   Pulmonary/Chest: Effort normal and breath sounds normal. No respiratory distress.  Musculoskeletal: Normal range of motion.  Neurological: She is alert and oriented to person, place, and time.  Skin: Skin is warm and dry.  Psychiatric: She has a normal mood and affect. Her behavior is normal.  Nursing note and  vitals reviewed.  BP 130/74 (BP Location: Right Arm, Patient Position: Sitting, Cuff Size: Large)   Pulse 78   Temp 98.2 F (36.8 C) (Oral)   Resp 18   Ht 5\' 2"  (1.575 m)   Wt 211 lb (95.7 kg)   SpO2 97%   BMI 38.59 kg/m     Assessment & Plan:   1. Acute non-recurrent frontal sinusitis   2. Primary osteoarthritis of both knees - huge improvement in sxs after stopping statin. Uses hydrocodone prn but no more than 1x/d.    3. Essential hypertension, benign - cont current regimen, ok to refill prn  4. Microalbuminuria   5. Type 2 diabetes mellitus with microalbuminuria, without long-term current use of insulin (HCC)     Meds ordered this encounter  Medications  . HYDROcodone-acetaminophen (NORCO/VICODIN) 5-325 MG tablet    Sig: Take 1 tablet by mouth every 6 (six) hours as needed for moderate pain.    Dispense:  120 tablet    Refill:  0  . lisinopril (PRINIVIL,ZESTRIL) 40 MG tablet    Sig: Take 1 tablet (40 mg total) by mouth daily.    Dispense:  90 tablet    Refill:  0    I personally performed the services described in this documentation, which was scribed in my presence. The recorded information has been reviewed and considered, and addended by me as needed.   Casey [name], M.D.  [facility]  [place] [address] [place], [place] [address] [phone] phone [phone] fax  01/28/16 10:38 PM

## 2015-12-27 NOTE — Patient Instructions (Addendum)
  Stop your triamterene-hctz 37.5-25 (also known as maxzide).   Stop your potassium.  Start lisinopril blood pressure medication instead.  This will help protect your kidneys.  Come back in 3 months for medication refills.    You can only get refills of your blood pressure medicine hydrocodone at an office visit - you cannot call in for refills or have your pharmacy request it.    IF you received an x-ray today, you will receive an invoice from [facility]. Please contact [facility] at [phone] with questions or concerns regarding your invoice.   IF you received labwork today, you will receive an invoice from [facility]. Please contact [facility] at [phone] with questions or concerns regarding your invoice.   Our billing staff will not be able to assist you with questions regarding bills from these companies.  You will be contacted with the lab results as soon as they are available. The fastest way to get your results is to activate your My Chart account. Instructions are located on the last page of this paperwork. If you have not heard from us regarding the results in 2 weeks, please contact this office.

## 2015-12-29 LAB — COMPREHENSIVE METABOLIC PANEL
ALBUMIN: 4 g/dL (ref 3.5–4.8)
ALT: 13 IU/L (ref 0–32)
AST: 15 IU/L (ref 0–40)
Albumin/Globulin Ratio: 1.5 (ref 1.2–2.2)
Alkaline Phosphatase: 83 IU/L (ref 39–117)
BUN / CREAT RATIO: 14 (ref 12–28)
BUN: 12 mg/dL (ref 8–27)
CHLORIDE: 94 mmol/L — AB (ref 96–106)
CO2: 23 mmol/L (ref 18–29)
Calcium: 9.5 mg/dL (ref 8.7–10.3)
Creatinine, Ser: 0.87 mg/dL (ref 0.57–1.00)
GFR calc non Af Amer: 64 mL/min/{1.73_m2} (ref >59)
GFR, EST AFRICAN AMERICAN: 74 mL/min/{1.73_m2} (ref >59)
GLOBULIN, TOTAL: 2.7 g/dL (ref 1.5–4.5)
GLUCOSE: 123 mg/dL — AB (ref 65–99)
Potassium: 4.5 mmol/L (ref 3.5–5.2)
SODIUM: 136 mmol/L (ref 134–144)
TOTAL PROTEIN: 6.7 g/dL (ref 6.0–8.5)

## 2015-12-29 LAB — SPECIMEN STATUS REPORT

## 2016-01-02 ENCOUNTER — Telehealth: Payer: Self-pay

## 2016-01-02 DIAGNOSIS — M170 Bilateral primary osteoarthritis of knee: Secondary | ICD-10-CM

## 2016-01-02 MED ORDER — HYDROCODONE-ACETAMINOPHEN 5-325 MG PO TABS: 1.0000 | ORAL_TABLET | Freq: Four times a day (QID) | ORAL | 0 refills | Status: DC | PRN

## 2016-01-02 NOTE — Telephone Encounter (Signed)
Refilled. Casey [name] bringing up to pt at 102

## 2016-01-02 NOTE — Telephone Encounter (Signed)
Dr Clelia CroftShaw   Patient presented the AVS to drug store,  apparently lost the script printed on Dec 8.  We confirmed it has NOT been processed through a pharmacy ([facility])

## 2016-01-05 ENCOUNTER — Other Ambulatory Visit: Payer: Self-pay | Admitting: Family Medicine

## 2016-01-07 ENCOUNTER — Ambulatory Visit: Payer: Medicare Other | Admitting: Podiatry

## 2016-01-08 NOTE — Telephone Encounter (Signed)
12.2017 last ov Last refill 07/2015 #90 with 3 refills

## 2016-01-16 ENCOUNTER — Other Ambulatory Visit: Payer: Self-pay | Admitting: Family Medicine

## 2016-02-19 ENCOUNTER — Other Ambulatory Visit: Payer: Self-pay | Admitting: Family Medicine

## 2016-02-19 DIAGNOSIS — F411 Generalized anxiety disorder: Secondary | ICD-10-CM

## 2016-02-20 ENCOUNTER — Telehealth: Payer: Self-pay | Admitting: *Deleted

## 2016-02-20 NOTE — Telephone Encounter (Signed)
Spoke to patient to let her know Xanax will be faxed to [facility] today.

## 2016-02-22 ENCOUNTER — Other Ambulatory Visit: Payer: Self-pay | Admitting: Family Medicine

## 2016-03-23 ENCOUNTER — Other Ambulatory Visit: Payer: Self-pay | Admitting: Family Medicine

## 2016-03-24 NOTE — Telephone Encounter (Signed)
Spoke with patient. Advised of refill and need for OV.  Transferred call to Ms. D Burton to schedule.  Meds ordered this encounter  Medications  . ipratropium (ATROVENT) 0.03 % nasal spray    Sig: instill 2 sprays into each nostril four times a day for congestion    Dispense:  30 mL    Refill:  1

## 2016-03-29 NOTE — Progress Notes (Signed)
Subjective:    Patient ID: Casey [name], female    DOB: 21-Aug-1935, 81 y.o.   MRN: [id number] Chief Complaint  Patient presents with  . Follow-up    HPI  Casey [name] is a 81 yo woman here for a 3 mo follow-up on her chronic medical conditions and more medication refills.  DMII: Diagnosed summer 2017 with hgba1c 6.6 -> 6.7 - trying diet changes. + urine microalb/Cr 12/2015 so started acei. Having worsening distal lower ext neuropathy on gabapentin 300 tid.  Sees podiatry occ.  Foot exam done 08/21/2015.  Seen by optho Dr. Dawna [name] 04/2015 and has recheck appt sched for next mo.   Does not eat many sweets and tries to stick to sugar free. She avoids starchy food like potatoes, pasta, white bread. Started asa 81mg  qd.   HLD: ASCVD risk increased form 18% to 26% since she came of statin but her knee pain improved so sig she does not want to restart a statin. LDL 139 with non-hdl 152 not on any chol med She is taking a fish oil supplement?  HTN:  Lisinopril 40 started at last visit 3 mos prior due to + urine microalb and had pt stop maxzide and K.  Insomnia: on xanax 1mg  qhs for decades - since 1980s.  Xanax bottle states she has 5 refills prior to 01/2016  Depression/Anxiety: lexapro 20 mg  Arthritis: B Knee pain chronic but sig improved since stopping statin. Avoids nsaids as instructed. Has been on hyrdocodone bid for years but really only takes qhs when in bed to sleep.. Last rx for #120 given   GERD with H/o esophageal stricture: On ppi omeprazole 20 bid. Had dilation prior by Dr. Madilyn [name]. Had abd pain this summer attributed to constipation from iron and hydrocodone resolved with miralax and docusate.  Then throughout all fall and nausea, abd pain, diarrhea worse with food.  Chronic vertigo: prn meclizine - takes very frequently  Left lower ext pain, resolved: suspected claudication so Referred to vascular 08/2015 but pt reported that pain resolved so never went  Past Medical  History:  Diagnosis Date  . ALLERGIC RHINITIS   . Allergy   . ANXIETY   . ARM PAIN, RIGHT   . BENIGN POSITIONAL VERTIGO   . Carcinoma in situ of colon   . CONSTIPATION   . CYSTOCELE WITH INCOMPLETE UTERINE PROLAPSE   . ESOPHAGEAL STRICTURE   . FATIGUE   . FLANK PAIN, LEFT   . Headache(784.0)   . HIP PAIN, LEFT   . HYPERCHOLESTEROLEMIA   . HYPERGLYCEMIA   . HYPERTENSION, BENIGN ESSENTIAL 03/03/2007  . LEG EDEMA, BILATERAL   . Microscopic hematuria   . OBESITY   . OSTEOARTHRITIS, KNEE   . OSTEOPOROSIS   . Peripheral neuropathy, idiopathic   . Problems with swallowing and mastication   . SINUSITIS, RECURRENT   . SPRAIN&STRAIN OTH SPEC SITES SHOULDER&UPPER ARM    Past Surgical History:  Procedure Laterality Date  . LEFT HEART CATHETERIZATION WITH CORONARY ANGIOGRAM N/A 12/02/2010   Procedure: LEFT HEART CATHETERIZATION WITH CORONARY ANGIOGRAM;  Surgeon: Casey M [place], MD;  [facility] LAB;  Service: Cardiovascular;  Laterality: N/A;   Current Outpatient Prescriptions on File Prior to Visit  Medication Sig Dispense Refill  . ALPRAZolam (XANAX) 1 MG tablet take 1 tablet by mouth at bedtime 30 tablet 5  . aspirin EC 81 MG tablet Take 1 tablet (81 mg total) by mouth daily.    . Cholecalciferol (VITAMIN D3) 5000  units CAPS Take 1 capsule by mouth daily.    . cyanocobalamin 500 MCG tablet Take 500 mcg by mouth daily. Reported on 07/02/2015    . escitalopram (LEXAPRO) 20 MG tablet TAKE 1 TABLET BY MOUTH ONCE DAILY 30 tablet 5  . Glucose Blood (BLOOD GLUCOSE TEST STRIPS) STRP Test glucose once daily DX 250.00 50 each 11  . ipratropium (ATROVENT) 0.03 % nasal spray instill 2 sprays into each nostril four times a day for congestion 30 mL 1  . omeprazole (PRILOSEC) 20 MG capsule Take 1 capsule (20 mg total) by mouth 2 (two) times daily before a meal. 60 capsule 3   No current facility-administered medications on file prior to visit.    No Known Allergies Family History  Problem  Relation Age of Onset  . Coronary artery disease Mother 57  . Cancer Father 24  . Heart disease Father   . Colon cancer    . Diabetes    . Coronary artery disease Sister   . Diabetes type II Sister   . Cancer Sister   . Coronary artery disease Brother   . Kidney disease Brother   . Coronary artery disease Brother    Social History   Social History  . Marital status: Single    Spouse name: N/A  . Number of children: N/A  . Years of education: N/A   Social History Main Topics  . Smoking status: Never Smoker  . Smokeless tobacco: Never Used  . Alcohol use No  . Drug use: No  . Sexual activity: Not Currently   Other Topics Concern  . Not on file   Social History Narrative  . No narrative on file   Depression screen [facility] 2/9 03/30/2016 12/27/2015 12/24/2015 08/21/2015 08/09/2015  Decreased Interest 0 0 0 0 0  Down, Depressed, Hopeless 0 0 0 0 0  PHQ - 2 Score 0 0 0 0 0  Altered sleeping - - - - -  Tired, decreased energy - - - - -  Change in appetite - - - - -  Feeling bad or failure about yourself  - - - - -  Trouble concentrating - - - - -  Moving slowly or fidgety/restless - - - - -  Suicidal thoughts - - - - -  PHQ-9 Score - - - - -    Review of Systems See hpi    Objective:   Physical Exam  Constitutional: She is oriented to person, place, and time. She appears well-developed and well-nourished. She appears lethargic. She appears ill. No distress.  HENT:  Head: Normocephalic and atraumatic.  Right Ear: External ear and ear canal normal. Tympanic membrane is retracted. A middle ear effusion is present.  Left Ear: External ear and ear canal normal. Tympanic membrane is retracted. A middle ear effusion is present.  Nose: Mucosal edema and rhinorrhea present. Right sinus exhibits maxillary sinus tenderness. Left sinus exhibits maxillary sinus tenderness.  Mouth/Throat: Uvula is midline and mucous membranes are normal. Posterior oropharyngeal erythema present. No  oropharyngeal exudate, posterior oropharyngeal edema or tonsillar abscesses.  Eyes: Conjunctivae are normal. Right eye exhibits no discharge. Left eye exhibits no discharge. No scleral icterus.  Neck: Normal range of motion. Neck supple.  Cardiovascular: Normal rate, regular rhythm, normal heart sounds and intact distal pulses.   Pulmonary/Chest: Effort normal and breath sounds normal.  Lymphadenopathy:       Head (right side): Submandibular adenopathy present. No preauricular and no posterior auricular adenopathy present.  Head (left side): Submandibular adenopathy present. No preauricular and no posterior auricular adenopathy present.    She has no cervical adenopathy.       Right: No supraclavicular adenopathy present.       Left: No supraclavicular adenopathy present.  Neurological: She is oriented to person, place, and time. She appears lethargic.  Skin: Skin is warm and dry. She is not diaphoretic. No erythema.  Psychiatric: She has a normal mood and affect. Her behavior is normal.    BP (!) 145/93   Pulse 78   Temp 98.9 F (37.2 C) (Oral)   Resp 16   Ht 5\' 2"  (1.575 m)   Wt 209 lb (94.8 kg)   SpO2 96%   BMI 38.23 kg/m    Results for orders placed or performed in visit on 03/30/16  POCT glycosylated hemoglobin (Hb A1C)  Result Value Ref Range   Hemoglobin A1C 6.9        Assessment & Plan:  Due for AWV - osteoporos?? Diagnosed 2009, vit D def?  a1c, cmp Cbc anemia If cefdinier not working by [employer], please call 1. Type 2 diabetes mellitus with microalbuminuria, without long-term current use of insulin (HCC)   2. HYPERTENSION, BENIGN ESSENTIAL   3. Chronic non-seasonal allergic rhinitis, unspecified trigger   4. Benign paroxysmal positional vertigo, unspecified laterality   5. Age-related osteoporosis without current pathological fracture   6. HYPERCHOLESTEROLEMIA   7. Encounter for medication monitoring   8. Anemia, unspecified type   9. Anxiety state   10.  Other acute recurrent sinusitis   11. Primary osteoarthritis of both knees   12. Acute left eye pain   13. Vision changes     Orders Placed This Encounter  Procedures  . CBC  . Comprehensive metabolic panel  . POCT glycosylated hemoglobin (Hb A1C)    Meds ordered this encounter  Medications  . cefdinir (OMNICEF) 300 MG capsule    Sig: Take 2 capsules (600 mg total) by mouth daily.    Dispense:  20 capsule    Refill:  0  . HYDROcodone-acetaminophen (NORCO/VICODIN) 5-325 MG tablet    Sig: Take 1 tablet by mouth every 6 (six) hours as needed for moderate pain.    Dispense:  120 tablet    Refill:  0  . gabapentin (NEURONTIN) 300 MG capsule    Sig: take 1 capsule by mouth three times a day    Dispense:  270 capsule    Refill:  1     [name], M.D.  Primary Care at [facility] [address] [place], [place] [address] [phone] phone [phone] fax  05/14/16 10:55 PM

## 2016-03-30 ENCOUNTER — Ambulatory Visit (INDEPENDENT_AMBULATORY_CARE_PROVIDER_SITE_OTHER): Payer: Medicare Other | Admitting: Family Medicine

## 2016-03-30 VITALS — BP 145/93 | HR 78 | Temp 98.9°F | Resp 16 | Ht 62.0 in | Wt 209.0 lb

## 2016-03-30 DIAGNOSIS — M170 Bilateral primary osteoarthritis of knee: Secondary | ICD-10-CM

## 2016-03-30 DIAGNOSIS — J0181 Other acute recurrent sinusitis: Secondary | ICD-10-CM | POA: Diagnosis not present

## 2016-03-30 DIAGNOSIS — E1129 Type 2 diabetes mellitus with other diabetic kidney complication: Secondary | ICD-10-CM

## 2016-03-30 DIAGNOSIS — M810 Age-related osteoporosis without current pathological fracture: Secondary | ICD-10-CM

## 2016-03-30 DIAGNOSIS — I10 Essential (primary) hypertension: Secondary | ICD-10-CM

## 2016-03-30 DIAGNOSIS — J3089 Other allergic rhinitis: Secondary | ICD-10-CM | POA: Diagnosis not present

## 2016-03-30 DIAGNOSIS — D649 Anemia, unspecified: Secondary | ICD-10-CM | POA: Diagnosis not present

## 2016-03-30 DIAGNOSIS — Z5181 Encounter for therapeutic drug level monitoring: Secondary | ICD-10-CM

## 2016-03-30 DIAGNOSIS — H5712 Ocular pain, left eye: Secondary | ICD-10-CM | POA: Diagnosis not present

## 2016-03-30 DIAGNOSIS — F411 Generalized anxiety disorder: Secondary | ICD-10-CM

## 2016-03-30 DIAGNOSIS — H8110 Benign paroxysmal vertigo, unspecified ear: Secondary | ICD-10-CM

## 2016-03-30 DIAGNOSIS — E7800 Pure hypercholesterolemia, unspecified: Secondary | ICD-10-CM

## 2016-03-30 DIAGNOSIS — H539 Unspecified visual disturbance: Secondary | ICD-10-CM

## 2016-03-30 DIAGNOSIS — R809 Proteinuria, unspecified: Secondary | ICD-10-CM

## 2016-03-30 LAB — POCT GLYCOSYLATED HEMOGLOBIN (HGB A1C): HEMOGLOBIN A1C: 6.9

## 2016-03-30 MED ORDER — HYDROCODONE-ACETAMINOPHEN 5-325 MG PO TABS: 1.0000 | ORAL_TABLET | Freq: Four times a day (QID) | ORAL | 0 refills | Status: DC | PRN

## 2016-03-30 MED ORDER — GABAPENTIN 300 MG PO CAPS: ORAL_CAPSULE | ORAL | 1 refills | Status: DC

## 2016-03-30 MED ORDER — CEFDINIR 300 MG PO CAPS: 600.0000 mg | ORAL_CAPSULE | Freq: Every day | ORAL | 0 refills | Status: DC

## 2016-03-30 NOTE — Patient Instructions (Signed)
     IF you received an x-ray today, you will receive an invoice from [facility]. Please contact [facility] at [phone] with questions or concerns regarding your invoice.   IF you received labwork today, you will receive an invoice from [facility]. Please contact [facility] at [phone] with questions or concerns regarding your invoice.   Our billing staff will not be able to assist you with questions regarding bills from these companies.  You will be contacted with the lab results as soon as they are available. The fastest way to get your results is to activate your My Chart account. Instructions are located on the last page of this paperwork. If you have not heard from us regarding the results in 2 weeks, please contact this office.     

## 2016-03-31 LAB — COMPREHENSIVE METABOLIC PANEL
A/G RATIO: 1.4 (ref 1.2–2.2)
ALT: 11 IU/L (ref 0–32)
AST: 14 IU/L (ref 0–40)
Albumin: 4.2 g/dL (ref 3.5–4.8)
Alkaline Phosphatase: 90 IU/L (ref 39–117)
BILIRUBIN TOTAL: 0.3 mg/dL (ref 0.0–1.2)
BUN/Creatinine Ratio: 13 (ref 12–28)
BUN: 12 mg/dL (ref 8–27)
CHLORIDE: 101 mmol/L (ref 96–106)
CO2: 26 mmol/L (ref 18–29)
Calcium: 9.5 mg/dL (ref 8.7–10.3)
Creatinine, Ser: 0.93 mg/dL (ref 0.57–1.00)
GFR calc non Af Amer: 59 mL/min/{1.73_m2} — ABNORMAL LOW (ref >59)
GFR, EST AFRICAN AMERICAN: 68 mL/min/{1.73_m2} (ref >59)
Globulin, Total: 2.9 g/dL (ref 1.5–4.5)
Glucose: 141 mg/dL — ABNORMAL HIGH (ref 65–99)
POTASSIUM: 4.1 mmol/L (ref 3.5–5.2)
SODIUM: 142 mmol/L (ref 134–144)
Total Protein: 7.1 g/dL (ref 6.0–8.5)

## 2016-03-31 LAB — CBC
Hematocrit: 36.3 % (ref 34.0–46.6)
Hemoglobin: 11.7 g/dL (ref 11.1–15.9)
MCH: 27.1 pg (ref 26.6–33.0)
MCHC: 32.2 g/dL (ref 31.5–35.7)
MCV: 84 fL (ref 79–97)
PLATELETS: 271 10*3/uL (ref 150–379)
RBC: 4.32 x10E6/uL (ref 3.77–5.28)
RDW: 15.8 % — AB (ref 12.3–15.4)
WBC: 6.4 10*3/uL (ref 3.4–10.8)

## 2016-04-07 ENCOUNTER — Other Ambulatory Visit: Payer: Self-pay | Admitting: Family Medicine

## 2016-04-20 ENCOUNTER — Other Ambulatory Visit: Payer: Self-pay | Admitting: Family Medicine

## 2016-05-20 ENCOUNTER — Other Ambulatory Visit: Payer: Self-pay | Admitting: Physician Assistant

## 2016-06-04 ENCOUNTER — Other Ambulatory Visit: Payer: Self-pay | Admitting: Family Medicine

## 2016-06-04 DIAGNOSIS — M170 Bilateral primary osteoarthritis of knee: Secondary | ICD-10-CM

## 2016-06-06 NOTE — Telephone Encounter (Signed)
03/30/16 last ov and refill

## 2016-06-11 ENCOUNTER — Telehealth: Payer: Self-pay | Admitting: Family Medicine

## 2016-06-11 DIAGNOSIS — M170 Bilateral primary osteoarthritis of knee: Secondary | ICD-10-CM

## 2016-06-11 MED ORDER — HYDROCODONE-ACETAMINOPHEN 5-325 MG PO TABS: 1.0000 | ORAL_TABLET | ORAL | 0 refills | Status: DC | PRN

## 2016-06-11 NOTE — Telephone Encounter (Signed)
03/30/16 last ov and refill

## 2016-06-11 NOTE — Telephone Encounter (Signed)
Pt is looking for a refill on her hydrocodone and she has an appt on 07/06/16   Best number [phone]

## 2016-06-11 NOTE — Telephone Encounter (Signed)
rx printed and will be at 102 for pt pick up

## 2016-06-12 ENCOUNTER — Telehealth: Payer: Self-pay

## 2016-06-12 NOTE — Telephone Encounter (Signed)
Spoke with pt and advised that prescription was ready for her to pick up at the clinic. Left in box at 102.

## 2016-06-22 ENCOUNTER — Other Ambulatory Visit: Payer: Self-pay | Admitting: Family Medicine

## 2016-06-22 DIAGNOSIS — Z1231 Encounter for screening mammogram for malignant neoplasm of breast: Secondary | ICD-10-CM

## 2016-06-29 ENCOUNTER — Ambulatory Visit
Admission: RE | Admit: 2016-06-29 | Discharge: 2016-06-29 | Disposition: A | Discharge status: Home / Self Care | Payer: Medicare Other | Source: Ambulatory Visit | Attending: Family Medicine | Admitting: Family Medicine

## 2016-06-29 ENCOUNTER — Ambulatory Visit: Payer: Medicare Other | Admitting: Family Medicine

## 2016-06-29 DIAGNOSIS — Z1231 Encounter for screening mammogram for malignant neoplasm of breast: Secondary | ICD-10-CM

## 2016-07-05 NOTE — Progress Notes (Signed)
Subjective:    Patient ID: Casey [name], female    DOB: Mar 20, 1935, 81 y.o.   MRN: [id number] Chief Complaint  Patient presents with  . Diabetes  . Follow-up    HPI  Casey [name] is a  81 year old woman here for a 3 month follow-up on her chronic medical conditions and medication refills.  DMII: Diagnosed summer 2017 - 1 yr prior - trying diet changes initially so held off on starting any medication.Does not eat many sweets and tries to stick to sugar free. She avoids starchy food like potatoes, pasta, white bread. However, a1c has been slowly but steadily increasing.  Started asa 70m qd. + urine microalb/Cr 12/2015 so started acei Dec 2017 with last eGFR 68 3 mos ago (3 mos after starting acei)! Not checking cbgs as we have not refilled her strips. Having worsening distal lower ext neuropathy on gabapentin 300 tid. Sees podiatry occ. Foot exam done 08/21/2015.  Seen by optho Dr. SRichardean Sale4/30/2018. Pneumonia and flu vaccines are UTD. Lab Results  Component Value Date   HGBA1C 6.9 03/30/2016   HGBA1C 6.7 12/24/2015   HGBA1C 6.6 (H) 07/02/2015   HGBA1C 6.2 10/11/2014      HLD: ASCVD risk increased form 18% to 26% since she came of statin last yr but her knee pain improved so sig she does not want to restart a statin. She is taking a fish oil supplement? Lipid Panel non-hdl 152 not on any chol med    Component Value Date/Time   CHOL 224 (H) 12/24/2015 0939   TRIG 63 12/24/2015 0939   HDL 72 12/24/2015 0939   CHOLHDL 3.1 12/24/2015 0939   CHOLHDL 3.6 10/11/2014 1129   VLDL 16 10/11/2014 1129   LDLCALC 139 (H) 12/24/2015 0939    HTN:  Lisinopril 40 started 6 mos prior due to + urine microalb and had pt stop maxzide and K.  Insomnia: on xanax 151mqhs for decades - since 1980s.   Depression/Anxiety: lexapro 20 mg  Arthritis: B Knee pain chronic but sig improved since stopping statin. Avoids nsaids as instructed. Has been on hyrdocodone bid for years but really only takes  qhs when in bed to sleep. Last rx for #60 filled 20d prior - pt has used 40 pills - pill count today. Left lower ext pain, resolved: suspected claudication so Referred to vascular 08/2015 but pt reported that pain resolved so never went  DEXA scan done 01/02/2009 showed T score of 0.3 in her Rt femur and 0.7 in her L spine so was normal. - none since ,Pt just had [facility] mamm[facility].  H/o Vitamin D def: on 1000u qd vit D, no calcium supplements Lab Results  Component Value Date   VD25OH 34 11/20/2013   VD25OH 50 10/23/2011     GERD with H/o esophageal stricture: On ppi omeprazole 20 bid. Had dilation prior by Dr. HaAmedeo PlentyHad constipation from iron and hydrocodone resolved with miralax and docusate last summer.  However, then all fall she suffered from nausea, abd pain, diarrhea worse with food.  Bowels are now regular with no problems.  Chronic vertigo: prn meclizine - takes at least3 times daily by her refill frequency or else the vertigo comes back immediately.   Current Outpatient Prescriptions:  .  ALPRAZolam (XANAX) 1 MG tablet, take 1 tablet by mouth at bedtime, Disp: 30 tablet, Rfl: 5 .  aspirin EC 81 MG tablet, Take 1 tablet (81 mg total) by mouth daily.,  Disp: , Rfl:  .  cefdinir (OMNICEF) 300 MG capsule, Take 2 capsules (600 mg total) by mouth daily., Disp: 20 capsule, Rfl: 0 .  Cholecalciferol (VITAMIN D3) 5000 units CAPS, Take 1 capsule by mouth daily., Disp: , Rfl:  .  cyanocobalamin 500 MCG tablet, Take 500 mcg by mouth daily. Reported on 07/02/2015, Disp: , Rfl:  .  escitalopram (LEXAPRO) 20 MG tablet, TAKE 1 TABLET BY MOUTH ONCE DAILY, Disp: 30 tablet, Rfl: 5 .  gabapentin (NEURONTIN) 300 MG capsule, take 1 capsule by mouth three times a day, Disp: 270 capsule, Rfl: 1 .  Glucose Blood (BLOOD GLUCOSE TEST STRIPS) STRP, Test glucose once daily DX 250.00, Disp: 50 each, Rfl: 11 .  HYDROcodone-acetaminophen (NORCO/VICODIN) 5-325 MG tablet, Take 1-2  tablets by mouth every 4 (four) hours as needed for moderate pain., Disp: 60 tablet, Rfl: 0 .  ipratropium (ATROVENT) 0.03 % nasal spray, instill 2 sprays into each nostril four times a day for congestion, Disp: 30 mL, Rfl: 1 .  lisinopril (PRINIVIL,ZESTRIL) 40 MG tablet, take 1 tablet by mouth once daily, Disp: 90 tablet, Rfl: 0 .  meclizine (ANTIVERT) 25 MG tablet, Take 1 tablet (25 mg total) by mouth 2 (two) times daily as needed for dizziness., Disp: 30 tablet, Rfl: 0 .  omeprazole (PRILOSEC) 20 MG capsule, Take 1 capsule (20 mg total) by mouth 2 (two) times daily before a meal., Disp: 60 capsule, Rfl: 3  Lab Results  Component Value Date   VITAMINB12 579 11/20/2013   Past Medical History:  Diagnosis Date  . ALLERGIC RHINITIS   . Allergy   . ANXIETY   . ARM PAIN, RIGHT   . BENIGN POSITIONAL VERTIGO   . Carcinoma in situ of colon   . CONSTIPATION   . CYSTOCELE WITH INCOMPLETE UTERINE PROLAPSE   . ESOPHAGEAL STRICTURE   . FATIGUE   . FLANK PAIN, LEFT   . Headache(784.0)   . HIP PAIN, LEFT   . HYPERCHOLESTEROLEMIA   . HYPERGLYCEMIA   . HYPERTENSION, BENIGN ESSENTIAL 03/03/2007  . LEG EDEMA, BILATERAL   . Microscopic hematuria   . OBESITY   . OSTEOARTHRITIS, KNEE   . OSTEOPOROSIS   . Peripheral neuropathy, idiopathic   . Problems with swallowing and mastication   . SINUSITIS, RECURRENT   . SPRAIN&STRAIN OTH SPEC SITES SHOULDER&UPPER ARM    Past Surgical History:  Procedure Laterality Date  . LEFT HEART CATHETERIZATION WITH CORONARY ANGIOGRAM N/A 12/02/2010   Procedure: LEFT HEART CATHETERIZATION WITH CORONARY ANGIOGRAM;  Surgeon: Peter M Martinique, MD;  Location: [facility] CATH LAB;  Service: Cardiovascular;  Laterality: N/A;   No Known Allergies Family History  Problem Relation Age of Onset  . Coronary artery disease Mother 30  . Cancer Father 62  . Heart disease Father   . Colon cancer Unknown   . Diabetes Unknown   . Coronary artery disease Sister   . Diabetes type II  Sister   . Cancer Sister   . Coronary artery disease Brother   . Kidney disease Brother   . Coronary artery disease Brother    Social History   Social History  . Marital status: Single    Spouse name: N/A  . Number of children: N/A  . Years of education: N/A   Social History Main Topics  . Smoking status: Never Smoker  . Smokeless tobacco: Never Used  . Alcohol use No  . Drug use: No  . Sexual activity: Not Currently   Other Topics  Concern  . None   Social History Narrative  . None   Depression screen [facility] 2/9 07/06/2016 03/30/2016 12/27/2015 12/24/2015 08/21/2015  Decreased Interest 0 0 0 0 0  Down, Depressed, Hopeless 0 0 0 0 0  PHQ - 2 Score 0 0 0 0 0  Altered sleeping - - - - -  Tired, decreased energy - - - - -  Change in appetite - - - - -  Feeling bad or failure about yourself  - - - - -  Trouble concentrating - - - - -  Moving slowly or fidgety/restless - - - - -  Suicidal thoughts - - - - -  PHQ-9 Score - - - - -    Review of Systems See hpi    Objective:   Physical Exam  Constitutional: She is oriented to person, place, and time. She appears well-developed and well-nourished. No distress.  HENT:  Head: Normocephalic and atraumatic.  Right Ear: External ear normal.  Left Ear: External ear normal.  Eyes: Conjunctivae are normal. No scleral icterus.  Lower lids puffy  Neck: Normal range of motion. Neck supple. No thyromegaly present.  Cardiovascular: Normal rate, regular rhythm, normal heart sounds and intact distal pulses.   Pulmonary/Chest: Effort normal and breath sounds normal. No respiratory distress.  Musculoskeletal: She exhibits no edema.  Lymphadenopathy:    She has no cervical adenopathy.  Neurological: She is alert and oriented to person, place, and time.  Skin: Skin is warm and dry. She is not diaphoretic. No erythema.  Psychiatric: She has a normal mood and affect. Her behavior is normal.     BP (!) 172/83   Pulse 68   Temp 97.6 F (36.4  C) (Oral)   Resp 18   Ht '5\' 2"'  (1.575 m)   Wt 211 lb 6.4 oz (95.9 kg)   SpO2 94%   BMI 38.67 kg/m      Assessment & Plan:  Do foot exam A1c,  Don't think she needs any other labs/blood draw but would add on cmp and then a lipid if pt is fasting if we do draw blood. Add on a vit D with next labs Dm ed?? - need to refer  Would be due for alprazolam refill in 6 weeks Has been using incresaing amount of hydrocodone - needs refill today Check ncdatabase. Have pt sign controlled drug contract Can't cont xanax and hydrocodone.  Sched appt for AWV in 3-4 mos  1. Type 2 diabetes mellitus with microalbuminuria, without long-term current use of insulin ([place])   2. HYPERTENSION, BENIGN ESSENTIAL   3. Encounter for medication monitoring   4. High risk medication use   5. Chronic pain syndrome   6. Arthralgia of multiple joints   7. Primary insomnia   8. Anxiety state   9. Primary osteoarthritis of both knees     Orders Placed This Encounter  Procedures  . Ambulatory referral to diabetic education    Referral Priority:   Routine    Referral Type:   Consultation    Referral Reason:   Specialty Services Required    Number of Visits Requested:   1  . Care order/instruction:    Scheduling Instructions:     Recheck BP  . Care order/instruction:    Scheduling Instructions:     Complete orders, AVS and go.  Marland Kitchen POCT glycosylated hemoglobin (Hb A1C)  . HM DIABETES FOOT EXAM    Meds ordered this encounter  Medications  . Glucose Blood (  BLOOD GLUCOSE TEST STRIPS) STRP    Sig: Test glucose once daily DX 250.00    Dispense:  50 each    Refill:  11    Please provide pt with the strips that corospond to blood glucose meter pt has.  Marland Kitchen HYDROcodone-acetaminophen (NORCO/VICODIN) 5-325 MG tablet    Sig: Take 1-2 tablets by mouth every 4 (four) hours as needed for moderate pain.    Dispense:  180 tablet    Refill:  0  . meclizine (ANTIVERT) 25 MG tablet    Sig: Take 1 tablet (25 mg total)  by mouth 3 (three) times daily.    Dispense:  270 tablet    Refill:  1  . ALPRAZolam (XANAX) 0.5 MG tablet    Sig: Take 1-2 tablets (0.5-1 mg total) by mouth at bedtime.    Dispense:  180 tablet    Refill:  1  . amitriptyline (ELAVIL) 10 MG tablet    Sig: Take 1 tablet (10 mg total) by mouth at bedtime.    Dispense:  90 tablet    Refill:  1  . loratadine (CLARITIN) 10 MG tablet    Sig: Take 1 tablet (10 mg total) by mouth at bedtime.    Dispense:  90 tablet    Refill:  1     Delman Cheadle, M.D.  Primary Care at [facility] [address] [place], [place] [address] [phone] phone [phone] fax  07/08/16 9:53 PM

## 2016-07-06 ENCOUNTER — Ambulatory Visit (INDEPENDENT_AMBULATORY_CARE_PROVIDER_SITE_OTHER): Payer: Medicare Other | Admitting: Family Medicine

## 2016-07-06 ENCOUNTER — Encounter: Payer: Self-pay | Admitting: Family Medicine

## 2016-07-06 VITALS — BP 138/76 | HR 68 | Temp 97.6°F | Resp 18 | Ht 62.0 in | Wt 211.4 lb

## 2016-07-06 DIAGNOSIS — Z5181 Encounter for therapeutic drug level monitoring: Secondary | ICD-10-CM | POA: Diagnosis not present

## 2016-07-06 DIAGNOSIS — M2550 Pain in unspecified joint: Secondary | ICD-10-CM

## 2016-07-06 DIAGNOSIS — R809 Proteinuria, unspecified: Secondary | ICD-10-CM | POA: Diagnosis not present

## 2016-07-06 DIAGNOSIS — E1129 Type 2 diabetes mellitus with other diabetic kidney complication: Secondary | ICD-10-CM | POA: Diagnosis not present

## 2016-07-06 DIAGNOSIS — Z79899 Other long term (current) drug therapy: Secondary | ICD-10-CM | POA: Diagnosis not present

## 2016-07-06 DIAGNOSIS — F411 Generalized anxiety disorder: Secondary | ICD-10-CM | POA: Diagnosis not present

## 2016-07-06 DIAGNOSIS — I10 Essential (primary) hypertension: Secondary | ICD-10-CM | POA: Diagnosis not present

## 2016-07-06 DIAGNOSIS — F5101 Primary insomnia: Secondary | ICD-10-CM

## 2016-07-06 DIAGNOSIS — G894 Chronic pain syndrome: Secondary | ICD-10-CM

## 2016-07-06 DIAGNOSIS — M170 Bilateral primary osteoarthritis of knee: Secondary | ICD-10-CM

## 2016-07-06 LAB — POCT GLYCOSYLATED HEMOGLOBIN (HGB A1C): HEMOGLOBIN A1C: 6.8

## 2016-07-06 MED ORDER — AMITRIPTYLINE HCL 10 MG PO TABS: 10.0000 mg | ORAL_TABLET | Freq: Every day | ORAL | 1 refills | Status: DC

## 2016-07-06 MED ORDER — ALPRAZOLAM 0.5 MG PO TABS: 0.5000 mg | ORAL_TABLET | Freq: Every day | ORAL | 1 refills | Status: DC

## 2016-07-06 MED ORDER — LORATADINE 10 MG PO TABS: 10.0000 mg | ORAL_TABLET | Freq: Every day | ORAL | 1 refills | Status: DC

## 2016-07-06 MED ORDER — MECLIZINE HCL 25 MG PO TABS: 25.0000 mg | ORAL_TABLET | Freq: Three times a day (TID) | ORAL | 1 refills | Status: DC

## 2016-07-06 MED ORDER — BLOOD GLUCOSE TEST VI STRP: ORAL_STRIP | 11 refills | Status: DC

## 2016-07-06 MED ORDER — HYDROCODONE-ACETAMINOPHEN 5-325 MG PO TABS: 1.0000 | ORAL_TABLET | ORAL | 0 refills | Status: DC | PRN

## 2016-07-06 NOTE — Patient Instructions (Addendum)
     IF you received an x-ray today, you will receive an invoice from [facility]. Please contact [facility] at [phone] with questions or concerns regarding your invoice.   IF you received labwork today, you will receive an invoice from [facility]. Please contact [facility] at [phone] with questions or concerns regarding your invoice.   Our billing staff will not be able to assist you with questions regarding bills from these companies.  You will be contacted with the lab results as soon as they are available. The fastest way to get your results is to activate your My Chart account. Instructions are located on the last page of this paperwork. If you have not heard from us regarding the results in 2 weeks, please contact this office.     

## 2016-07-16 ENCOUNTER — Other Ambulatory Visit: Payer: Self-pay | Admitting: Physician Assistant

## 2016-07-16 NOTE — Telephone Encounter (Signed)
Dr Clelia CroftShaw, this patient has seen you on 07/06/16, please advise. Thank you/ S.Kewana Sanon,CMA

## 2016-07-28 ENCOUNTER — Other Ambulatory Visit: Payer: Self-pay | Admitting: Family Medicine

## 2016-07-28 DIAGNOSIS — K219 Gastro-esophageal reflux disease without esophagitis: Secondary | ICD-10-CM

## 2016-07-29 ENCOUNTER — Other Ambulatory Visit: Payer: Self-pay

## 2016-08-27 ENCOUNTER — Other Ambulatory Visit: Payer: Self-pay | Admitting: Family Medicine

## 2016-09-02 ENCOUNTER — Ambulatory Visit (INDEPENDENT_AMBULATORY_CARE_PROVIDER_SITE_OTHER): Payer: Medicare Other | Admitting: Podiatry

## 2016-09-02 ENCOUNTER — Encounter: Payer: Self-pay | Admitting: Podiatry

## 2016-09-02 DIAGNOSIS — M79676 Pain in unspecified toe(s): Secondary | ICD-10-CM | POA: Diagnosis not present

## 2016-09-02 DIAGNOSIS — B351 Tinea unguium: Secondary | ICD-10-CM | POA: Diagnosis not present

## 2016-09-02 NOTE — Progress Notes (Signed)
Patient ID: Casey [name], female   DOB: 1935/11/07, 81 y.o.   MRN: [id number]   Subjective: This patient presents again to our office complaining of extremely elongated, thickened, uncomfortable toenails walking wearing shoes and requests debridement of these nails. Patient last presented for a similar service was on 10/08/2015 denies any self treatment many other professional treatment  Objective: Patient estimates height is 5 foot 2 inches 190 pounds Orientated 3 DP and PT pulses 2/4 bilaterally Capillary reflex within normal limits bilaterally Sensation to 10 g monofilament wire intact 5/5 bilaterally Vibratory sensation reactive bilaterally Ankle reflex reactive bilaterally No open skin lesions bilaterally Absent hair growth bilaterally The toenails are extremely elongated, discolored, deformed, hypertrophic and tender to direct palpation 6-10 Pes planus bilaterally Manual motor testing dorsi flexion, plantar flexion 5/5 bilaterally There is no restriction ankle, subtalar, midtarsal joints bilaterally  Assessment: Satisfactory neurovascular status Extremely neglected symptomatic mycotic toenails 6-10  Plan: Debridement toenails 6-10 mechanically and electrically without any bleeding  Reappoint 3 months

## 2016-09-07 ENCOUNTER — Other Ambulatory Visit: Payer: Self-pay | Admitting: Family Medicine

## 2016-09-07 MED ORDER — GLUCOSE BLOOD VI STRP: 1.0000 | ORAL_STRIP | Freq: Every day | 11 refills | Status: DC

## 2016-09-08 ENCOUNTER — Other Ambulatory Visit: Payer: Self-pay | Admitting: Emergency Medicine

## 2016-09-08 MED ORDER — GLUCOSE BLOOD VI STRP: 1.0000 | ORAL_STRIP | Freq: Every day | 11 refills | Status: DC

## 2016-09-16 ENCOUNTER — Ambulatory Visit (INDEPENDENT_AMBULATORY_CARE_PROVIDER_SITE_OTHER): Payer: Medicare Other | Admitting: Family Medicine

## 2016-09-16 ENCOUNTER — Encounter: Payer: Self-pay | Admitting: Family Medicine

## 2016-09-16 VITALS — BP 165/87 | HR 84 | Temp 98.0°F | Resp 18 | Ht 62.0 in | Wt 209.0 lb

## 2016-09-16 DIAGNOSIS — Z23 Encounter for immunization: Secondary | ICD-10-CM | POA: Diagnosis not present

## 2016-09-16 DIAGNOSIS — J329 Chronic sinusitis, unspecified: Secondary | ICD-10-CM

## 2016-09-16 DIAGNOSIS — M170 Bilateral primary osteoarthritis of knee: Secondary | ICD-10-CM | POA: Diagnosis not present

## 2016-09-16 DIAGNOSIS — J069 Acute upper respiratory infection, unspecified: Secondary | ICD-10-CM | POA: Diagnosis not present

## 2016-09-16 DIAGNOSIS — M1710 Unilateral primary osteoarthritis, unspecified knee: Secondary | ICD-10-CM

## 2016-09-16 DIAGNOSIS — I10 Essential (primary) hypertension: Secondary | ICD-10-CM | POA: Diagnosis not present

## 2016-09-16 DIAGNOSIS — E1129 Type 2 diabetes mellitus with other diabetic kidney complication: Secondary | ICD-10-CM

## 2016-09-16 DIAGNOSIS — R809 Proteinuria, unspecified: Secondary | ICD-10-CM

## 2016-09-16 DIAGNOSIS — K029 Dental caries, unspecified: Secondary | ICD-10-CM

## 2016-09-16 DIAGNOSIS — R0989 Other specified symptoms and signs involving the circulatory and respiratory systems: Secondary | ICD-10-CM

## 2016-09-16 DIAGNOSIS — IMO0002 Reserved for concepts with insufficient information to code with codable children: Secondary | ICD-10-CM

## 2016-09-16 DIAGNOSIS — K068 Other specified disorders of gingiva and edentulous alveolar ridge: Secondary | ICD-10-CM | POA: Diagnosis not present

## 2016-09-16 LAB — POCT CBC
GRANULOCYTE PERCENT: 45.6 % (ref 37–80)
HCT, POC: 36.6 % — AB (ref 37.7–47.9)
HEMOGLOBIN: 12 g/dL — AB (ref 12.2–16.2)
Lymph, poc: 3.5 — AB (ref 0.6–3.4)
MCH: 27.5 pg (ref 27–31.2)
MCHC: 32.9 g/dL (ref 31.8–35.4)
MCV: 83.8 fL (ref 80–97)
MID (CBC): 0.5 (ref 0–0.9)
MPV: 7.6 fL (ref 0–99.8)
POC GRANULOCYTE: 3.3 (ref 2–6.9)
POC LYMPH PERCENT: 47.6 %L (ref 10–50)
POC MID %: 6.8 % (ref 0–12)
Platelet Count, POC: 265 10*3/uL (ref 142–424)
RBC: 4.36 M/uL (ref 4.04–5.48)
RDW, POC: 15.7 %
WBC: 7.3 10*3/uL (ref 4.6–10.2)

## 2016-09-16 MED ORDER — CLONIDINE HCL 0.1 MG PO TABS
0.1000 mg | ORAL_TABLET | Freq: Once | ORAL | Status: AC
  Administered 2016-09-16: 0.1 mg via ORAL

## 2016-09-16 MED ORDER — AZELASTINE HCL 0.1 % NA SOLN: 1.0000 | Freq: Two times a day (BID) | NASAL | 2 refills | Status: DC

## 2016-09-16 MED ORDER — TRIAMCINOLONE ACETONIDE 55 MCG/ACT NA AERO: 2.0000 | INHALATION_SPRAY | Freq: Every day | NASAL | 2 refills | Status: DC

## 2016-09-16 MED ORDER — HYDROCODONE-ACETAMINOPHEN 5-325 MG PO TABS: 1.0000 | ORAL_TABLET | ORAL | 0 refills | Status: DC | PRN

## 2016-09-16 MED ORDER — MONTELUKAST SODIUM 10 MG PO TABS: 10.0000 mg | ORAL_TABLET | Freq: Every day | ORAL | 3 refills | Status: DC

## 2016-09-16 NOTE — Patient Instructions (Addendum)
STOP THE ALEVE - Do not use any otc pain medication other than tylenol/acetaminophen - so no aleve, ibuprofen, motrin, advil, etc.     IF you received an x-ray today, you will receive an [facility]. Please contact [facility] at [phone] with questions or concerns regarding your invoice.   IF you received labwork today, you will receive an invoice from [facility]. Please contact [facility] at [phone] with questions or concerns regarding your invoice.   Our billing staff will not be able to assist you with questions regarding bills from these companies.  You will be contacted with the lab results as soon as they are available. The fastest way to get your results is to activate your My Chart account. Instructions are located on the last page of this paperwork. If you have not heard from us regarding the results in 2 weeks, please contact this office.

## 2016-09-16 NOTE — Progress Notes (Signed)
Subjective:  By signing my name below, I, Casey [name], attest that this documentation has been prepared under the direction and in the presence of Casey Sorenson, MD Electronically Signed: [name], ED Scribe 09/16/2016 at 1:40 PM.   Patient ID: Casey [name], female    DOB: 1935/07/04, 81 y.o.   MRN: [id number]  Chief Complaint  Patient presents with  . Oral Swelling    x3 weeks on right lower gum-line; states tooth is rotten  . Flu Vaccine   HPI Casey [name] is a 81 y.o. female who presents to Primary Care at [facility] complaining of oral swelling to the right lower gumline x 3 weeks. Pt denies associated dental pain. She does not currently have a dentist. She has a friend who takes her to her appointments.    Eye Swelling Pt reports bilateral eye puffiness and nasal congestion x 2-3 weeks. Last saw Dr. Nile Riggs in April. Also reports hot flashes which she has had for several years. Denies eye itching, eye watering, fever, chills. Pt denies taking medication for allergy symptoms but has taken 3 Aleve tablets to replace pain pills.   HTN Pt states that she took all of her BP medications today. Triage BP: 189/81, Repeat BP: 180/100. Pt eports HAs upon waking in the morning that self-resolved. Denies chest pain, sob.   Past Medical History:  Diagnosis Date  . ALLERGIC RHINITIS   . Allergy   . ANXIETY   . ARM PAIN, RIGHT   . BENIGN POSITIONAL VERTIGO   . Carcinoma in situ of colon   . CONSTIPATION   . CYSTOCELE WITH INCOMPLETE UTERINE PROLAPSE   . ESOPHAGEAL STRICTURE   . FATIGUE   . FLANK PAIN, LEFT   . Headache(784.0)   . HIP PAIN, LEFT   . HYPERCHOLESTEROLEMIA   . HYPERGLYCEMIA   . HYPERTENSION, BENIGN ESSENTIAL 03/03/2007  . LEG EDEMA, BILATERAL   . Microscopic hematuria   . OBESITY   . OSTEOARTHRITIS, KNEE   . OSTEOPOROSIS   . Peripheral neuropathy, idiopathic   . Problems with swallowing and mastication   . SINUSITIS, RECURRENT   . SPRAIN&STRAIN OTH SPEC SITES  SHOULDER&UPPER ARM    Current Outpatient Prescriptions on File Prior to Visit  Medication Sig Dispense Refill  . ALPRAZolam (XANAX) 0.5 MG tablet Take 1-2 tablets (0.5-1 mg total) by mouth at bedtime. 180 tablet 1  . amitriptyline (ELAVIL) 10 MG tablet Take 1 tablet (10 mg total) by mouth at bedtime. 90 tablet 1  . aspirin EC 81 MG tablet Take 1 tablet (81 mg total) by mouth daily.    . Cholecalciferol (VITAMIN D3) 5000 units CAPS Take 1 capsule by mouth daily.    . cyanocobalamin 500 MCG tablet Take 500 mcg by mouth daily. Reported on 07/02/2015    . escitalopram (LEXAPRO) 20 MG tablet TAKE 1 TABLET BY MOUTH ONCE DAILY 30 tablet 5  . gabapentin (NEURONTIN) 300 MG capsule take 1 capsule by mouth three times a day 270 capsule 1  . glucose blood test strip 1 each by Other route daily. Use as instructed 50 each 11  . HYDROcodone-acetaminophen (NORCO/VICODIN) 5-325 MG tablet Take 1-2 tablets by mouth every 4 (four) hours as needed for moderate pain. 180 tablet 0  . ipratropium (ATROVENT) 0.03 % nasal spray instill 2 sprays into each nostril four times a day for congestion 30 mL 1  . lisinopril (PRINIVIL,ZESTRIL) 40 MG tablet take 1 tablet by mouth once daily 90 tablet 1  .  loratadine (CLARITIN) 10 MG tablet Take 1 tablet (10 mg total) by mouth at bedtime. 90 tablet 1  . meclizine (ANTIVERT) 25 MG tablet Take 1 tablet (25 mg total) by mouth 3 (three) times daily. 270 tablet 1  . omeprazole (PRILOSEC) 20 MG capsule Take 1 capsule (20 mg total) by mouth 2 (two) times daily before a meal. As needed for heartburn 60 capsule 3   No current facility-administered medications on file prior to visit.    No Known Allergies   Review of Systems  Constitutional: Negative for chills and fever.  HENT: Positive for congestion, dental problem and facial swelling (around eyes).   Eyes: Negative for discharge and itching.  Respiratory: Negative for shortness of breath.   Cardiovascular: Negative for chest pain.    Neurological: Positive for headaches.      Objective:   Physical Exam  Constitutional: She is oriented to person, place, and time. She appears well-developed and well-nourished. No distress.  HENT:  Head: Normocephalic and atraumatic.  Right Ear: Tympanic membrane normal.  Left Ear: Tympanic membrane normal.  Polypoid tag at gumline. Vascularity, not friable beneath the R lower incisor. Nares: pale, boggy, dry mucosa   Eyes: Conjunctivae and EOM are normal.  Significant amount of periorbital edema  Neck: Neck supple. No tracheal deviation present.  Cardiovascular: Normal rate.   Pulmonary/Chest: Effort normal. No respiratory distress.  Musculoskeletal: Normal range of motion.  Lymphadenopathy:       Head (right side): No submandibular adenopathy present.       Head (left side): No submandibular adenopathy present.    She has no cervical adenopathy.  Neurological: She is alert and oriented to person, place, and time.  Skin: Skin is warm and dry.  Psychiatric: She has a normal mood and affect. Her behavior is normal.  Nursing note and vitals reviewed.   Vitals:   09/16/16 1319 09/16/16 1358 09/16/16 1426  BP: (!) 189/81 (!) 180/100 (!) 165/87  Pulse: 84    Resp: 18    Temp: 98 F (36.7 C)    TempSrc: Oral    SpO2: 98%    Weight: 209 lb (94.8 kg)    Height: 5\' 2"  (1.575 m)        Assessment & Plan:   1. Need for influenza vaccination   2. Dental caries   3. Gum lesion   4. URI, acute   5. HYPERTENSION, BENIGN ESSENTIAL   6. Chronic sinusitis, unspecified location   7. Osteoarthrosis involving lower leg   8. Labile hypertension   9. Primary osteoarthritis of both knees   10. Type 2 diabetes mellitus with microalbuminuria, without long-term current use of insulin ([facility])     Orders Placed This Encounter  Procedures  . Flu Vaccine QUAD 6+ mos PF IM (Fluarix Quad PF)  . Comprehensive metabolic panel  . Ambulatory referral to Connected Care    Referral Priority:    Routine    Referral Type:   Consultation    Referral Reason:   Specialty Services Required    Number of Visits Requested:   1  . Care order/instruction:    Scheduling Instructions:     Recheck BP  . POCT CBC    Meds ordered this encounter  Medications  . cloNIDine (CATAPRES) tablet 0.1 mg  . HYDROcodone-acetaminophen (NORCO/VICODIN) 5-325 MG tablet    Sig: Take 1-2 tablets by mouth every 4 (four) hours as needed for moderate pain.    Dispense:  180 tablet  Refill:  0  . montelukast (SINGULAIR) 10 MG tablet    Sig: Take 1 tablet (10 mg total) by mouth at bedtime.    Dispense:  30 tablet    Refill:  3  . triamcinolone (NASACORT) 55 MCG/ACT AERO nasal inhaler    Sig: Place 2 sprays into the nose daily.    Dispense:  1 Inhaler    Refill:  2  . azelastine (ASTELIN) 0.1 % nasal spray    Sig: Place 1 spray into both nostrils 2 (two) times daily. Use in each nostril as directed    Dispense:  30 mL    Refill:  2    I personally performed the services described in this documentation, which was scribed in my presence. The recorded information has been reviewed and considered, and addended by me as needed.   Casey [name], M.D.  Primary Care at [facility] [address] [place], [place] [address] [phone] phone [phone] fax  09/19/16 8:28 AM

## 2016-09-17 LAB — COMPREHENSIVE METABOLIC PANEL
A/G RATIO: 1.3 (ref 1.2–2.2)
ALBUMIN: 4.1 g/dL (ref 3.5–4.8)
ALT: 15 IU/L (ref 0–32)
AST: 20 IU/L (ref 0–40)
Alkaline Phosphatase: 104 IU/L (ref 39–117)
BUN / CREAT RATIO: 13 (ref 12–28)
BUN: 13 mg/dL (ref 8–27)
Bilirubin Total: 0.4 mg/dL (ref 0.0–1.2)
CALCIUM: 9.3 mg/dL (ref 8.7–10.3)
CO2: 27 mmol/L (ref 20–29)
Chloride: 104 mmol/L (ref 96–106)
Creatinine, Ser: 1.04 mg/dL — ABNORMAL HIGH (ref 0.57–1.00)
GFR, EST AFRICAN AMERICAN: 59 mL/min/{1.73_m2} — AB (ref >59)
GFR, EST NON AFRICAN AMERICAN: 51 mL/min/{1.73_m2} — AB (ref >59)
GLUCOSE: 116 mg/dL — AB (ref 65–99)
Globulin, Total: 3.1 g/dL (ref 1.5–4.5)
Potassium: 4 mmol/L (ref 3.5–5.2)
Sodium: 144 mmol/L (ref 134–144)
TOTAL PROTEIN: 7.2 g/dL (ref 6.0–8.5)

## 2016-10-14 ENCOUNTER — Telehealth: Payer: Self-pay

## 2016-10-14 NOTE — Telephone Encounter (Signed)
Called pt to discuss need for community resources.  No answer, and unable to leave vm. Will try again.   Sherle Poe, B.A.  Care Guide [phone]

## 2016-11-02 ENCOUNTER — Encounter: Payer: Medicare Other | Admitting: Family Medicine

## 2016-11-12 ENCOUNTER — Encounter: Payer: Medicare Other | Admitting: Family Medicine

## 2016-11-13 ENCOUNTER — Telehealth: Payer: Self-pay | Admitting: Family Medicine

## 2016-11-13 DIAGNOSIS — M170 Bilateral primary osteoarthritis of knee: Secondary | ICD-10-CM

## 2016-11-13 NOTE — Telephone Encounter (Signed)
Pt is calling to request a refill of her hydrocodone.  Please advise [phone]

## 2016-11-17 MED ORDER — HYDROCODONE-ACETAMINOPHEN 5-325 MG PO TABS: 1.0000 | ORAL_TABLET | ORAL | 0 refills | Status: DC | PRN

## 2016-11-17 NOTE — Telephone Encounter (Signed)
Needs OV for any additional refills.   Rx ready for pick up.  Will refill #120 which should last pt 2-3 mos.  [place] [facility] reviewed.  Pt was using  Hydrocodone #40/mo on average until 5 mos ago when she rapidly increased to #60/mo x 1 mo, now #90/mo x 4 mos.  Pt is a very poor historian.

## 2016-11-17 NOTE — Telephone Encounter (Signed)
Please advise 

## 2016-11-17 NOTE — Telephone Encounter (Signed)
Pt advised. Rx at 102 ready for pickup

## 2016-11-30 ENCOUNTER — Ambulatory Visit: Payer: Medicare Other | Admitting: Podiatry

## 2016-12-25 ENCOUNTER — Ambulatory Visit (INDEPENDENT_AMBULATORY_CARE_PROVIDER_SITE_OTHER): Payer: Medicare Other

## 2016-12-25 VITALS — BP 150/90 | HR 94 | Ht 62.0 in | Wt 208.1 lb

## 2016-12-25 DIAGNOSIS — K029 Dental caries, unspecified: Secondary | ICD-10-CM | POA: Diagnosis not present

## 2016-12-25 DIAGNOSIS — Z0000 Encounter for general adult medical examination without abnormal findings: Secondary | ICD-10-CM

## 2016-12-25 DIAGNOSIS — E119 Type 2 diabetes mellitus without complications: Secondary | ICD-10-CM | POA: Diagnosis not present

## 2016-12-25 DIAGNOSIS — E7800 Pure hypercholesterolemia, unspecified: Secondary | ICD-10-CM | POA: Diagnosis not present

## 2016-12-25 NOTE — Patient Instructions (Addendum)
[name] , Thank you for taking time to come for your Medicare Wellness Visit. I appreciate your ongoing commitment to your health goals. Please review the following plan we discussed and let me know if I can assist you in the future.   Screening recommendations/referrals: Colonoscopy: up to date Mammogram: up to date Bone Density: declined Recommended yearly ophthalmology/optometry visit for glaucoma screening and checkup Recommended yearly dental visit for hygiene and checkup  Vaccinations: Influenza vaccine: up to date Pneumococcal vaccine: up to date Tdap vaccine: up to date, next due 09/27/2017 Shingles vaccine: Check with your pharmacy about receiving this vaccine    Advanced directives: Advance directive discussed with you today. Even though you declined this today please call our office should you change your mind and we can give you the proper paperwork for you to fill out.   Conditions/risks identified:  Try to start helping to improve her memory. Try to do some crossword puzzles.   Next appointment: 12/30/16 @ 11 am with Dr Clelia CroftShaw   Preventive Care 65 Years and Older, Female Preventive care refers to lifestyle choices and visits with your health care provider that can promote health and wellness. What does preventive care include?  A yearly physical exam. This is also called an annual well check.  Dental exams once or twice a year.  Routine eye exams. Ask your health care provider how often you should have your eyes checked.  Personal lifestyle choices, including:  Daily care of your teeth and gums.  Regular physical activity.  Eating a healthy diet.  Avoiding tobacco and drug use.  Limiting alcohol use.  Practicing safe sex.  Taking low-dose aspirin every day.  Taking vitamin and mineral supplements as recommended by your health care provider. What happens during an annual well check? The services and screenings done by your health care provider during  your annual well check will depend on your age, overall health, lifestyle risk factors, and family history of disease. Counseling  Your health care provider may ask you questions about your:  Alcohol use.  Tobacco use.  Drug use.  Emotional well-being.  Home and relationship well-being.  Sexual activity.  Eating habits.  History of falls.  Memory and ability to understand (cognition).  Work and work Astronomerenvironment.  Reproductive health. Screening  You may have the following tests or measurements:  Height, weight, and BMI.  Blood pressure.  Lipid and cholesterol levels. These may be checked every 5 years, or more frequently if you are over 79 years old.  Skin check.  Lung cancer screening. You may have this screening every year starting at age 79 if you have a 30-pack-year history of smoking and currently smoke or have quit within the past 15 years.  Fecal occult blood test (FOBT) of the stool. You may have this test every year starting at age 10650.  Flexible sigmoidoscopy or colonoscopy. You may have a sigmoidoscopy every 5 years or a colonoscopy every 10 years starting at age 350.  Hepatitis C blood test.  Hepatitis B blood test.  Sexually transmitted disease (STD) testing.  Diabetes screening. This is done by checking your blood sugar (glucose) after you have not eaten for a while (fasting). You may have this done every 1-3 years.  Bone density scan. This is done to screen for osteoporosis. You may have this done starting at age 79.  Mammogram. This may be done every 1-2 years. Talk to your health care provider about how often you should have regular mammograms.  Talk with your health care provider about your test results, treatment options, and if necessary, the need for more tests. Vaccines  Your health care provider may recommend certain vaccines, such as:  Influenza vaccine. This is recommended every year.  Tetanus, diphtheria, and acellular pertussis (Tdap,  Td) vaccine. You may need a Td booster every 10 years.  Zoster vaccine. You may need this after age 26.  Pneumococcal 13-valent conjugate (PCV13) vaccine. One dose is recommended after age 42.  Pneumococcal polysaccharide (PPSV23) vaccine. One dose is recommended after age 56. Talk to your health care provider about which screenings and vaccines you need and how often you need them. This information is not intended to replace advice given to you by your health care provider. Make sure you discuss any questions you have with your health care provider. Document Released: 02/01/2015 Document Revised: 09/25/2015 Document Reviewed: 11/06/2014 Elsevier Interactive Patient Education  2017 [place] Prevention in the Home Falls can cause injuries. They can happen to people of all ages. There are many things you can do to make your home safe and to help prevent falls. What can I do on the outside of my home?  Regularly fix the edges of walkways and driveways and fix any cracks.  Remove anything that might make you trip as you walk through a door, such as a raised step or threshold.  Trim any bushes or trees on the path to your home.  Use bright outdoor lighting.  Clear any walking paths of anything that might make someone trip, such as rocks or tools.  Regularly check to see if handrails are loose or broken. Make sure that both sides of any steps have handrails.  Any raised decks and porches should have guardrails on the edges.  Have any leaves, snow, or ice cleared regularly.  Use sand or salt on walking paths during winter.  Clean up any spills in your garage right away. This includes oil or grease spills. What can I do in the bathroom?  Use night lights.  Install grab bars by the toilet and in the tub and shower. Do not use towel bars as grab bars.  Use non-skid mats or decals in the tub or shower.  If you need to sit down in the shower, use a plastic, non-slip  stool.  Keep the floor dry. Clean up any water that spills on the floor as soon as it happens.  Remove soap buildup in the tub or shower regularly.  Attach bath mats securely with double-sided non-slip rug tape.  Do not have throw rugs and other things on the floor that can make you trip. What can I do in the bedroom?  Use night lights.  Make sure that you have a light by your bed that is easy to reach.  Do not use any sheets or blankets that are too big for your bed. They should not hang down onto the floor.  Have a firm chair that has side arms. You can use this for support while you get dressed.  Do not have throw rugs and other things on the floor that can make you trip. What can I do in the kitchen?  Clean up any spills right away.  Avoid walking on wet floors.  Keep items that you use a lot in easy-to-reach places.  If you need to reach something above you, use a strong step stool that has a grab bar.  Keep electrical cords out of the way.  Do not use floor polish or wax that makes floors slippery. If you must use wax, use non-skid floor wax.  Do not have throw rugs and other things on the floor that can make you trip. What can I do with my stairs?  Do not leave any items on the stairs.  Make sure that there are handrails on both sides of the stairs and use them. Fix handrails that are broken or loose. Make sure that handrails are as long as the stairways.  Check any carpeting to make sure that it is firmly attached to the stairs. Fix any carpet that is loose or worn.  Avoid having throw rugs at the top or bottom of the stairs. If you do have throw rugs, attach them to the floor with carpet tape.  Make sure that you have a light switch at the top of the stairs and the bottom of the stairs. If you do not have them, ask someone to add them for you. What else can I do to help prevent falls?  Wear shoes that:  Do not have high heels.  Have rubber bottoms.  Are  comfortable and fit you well.  Are closed at the toe. Do not wear sandals.  If you use a stepladder:  Make sure that it is fully opened. Do not climb a closed stepladder.  Make sure that both sides of the stepladder are locked into place.  Ask someone to hold it for you, if possible.  Clearly mark and make sure that you can see:  Any grab bars or handrails.  First and last steps.  Where the edge of each step is.  Use tools that help you move around (mobility aids) if they are needed. These include:  Canes.  Walkers.  Scooters.  Crutches.  Turn on the lights when you go into a dark area. Replace any light bulbs as soon as they burn out.  Set up your furniture so you have a clear path. Avoid moving your furniture around.  If any of your floors are uneven, fix them.  If there are any pets around you, be aware of where they are.  Review your medicines with your doctor. Some medicines can make you feel dizzy. This can increase your chance of falling. Ask your doctor what other things that you can do to help prevent falls. This information is not intended to replace advice given to you by your health care provider. Make sure you discuss any questions you have with your health care provider. Document Released: 11/01/2008 Document Revised: 06/13/2015 Document Reviewed: 02/09/2014 Elsevier Interactive Patient Education  2017 [employer].

## 2016-12-25 NOTE — Progress Notes (Signed)
Subjective:   Casey [name] is a 81 y.o. female who presents for Medicare Annual (Subsequent) preventive examination.  Review of Systems:  N/A Cardiac Risk Factors include: advanced age (>7155men, 81>65 women);diabetes mellitus;dyslipidemia;obesity (BMI >30kg/m2);sedentary lifestyle     Objective:     Vitals: BP (!) 150/90   Pulse 94   Ht 5\' 2"  (1.575 m)   Wt 208 lb 2 oz (94.4 kg)   SpO2 98%   BMI 38.07 kg/m   Body mass index is 38.07 kg/m.  Advanced Directives 12/25/2016 12/01/2010  Does Patient Have a Medical Advance Directive? No Patient does not have advance directive  Would patient like information on creating a medical advance directive? No - Patient declined -  Pre-existing out of facility DNR order (yellow form or pink MOST form) - No    Tobacco Social History   Tobacco Use  Smoking Status Never Smoker  Smokeless Tobacco Never Used     Counseling given: Not Answered   Clinical Intake:  Pre-visit preparation completed: Yes  Pain : 0-10 Pain Score: 10-Worst pain ever Pain Type: Chronic pain Pain Location: Knee Pain Orientation: Right, Left Pain Descriptors / Indicators: Aching Pain Onset: More than a month ago Pain Frequency: Constant     Nutritional Status: BMI > 30  Obese Nutritional Risks: None Diabetes: Yes CBG done?: No Did pt. bring in CBG monitor from home?: No  Activities of Daily Living: Independent Ambulation: Independent with device- listed below Home Assistive Devices/Equipment: Eyeglasses Medication Administration: Independent Home Management: Independent  Barriers to Care Management & Learning: None  Do you feel unsafe in your current relationship?: No Do you feel physically threatened by others?: No Anyone hurting you at home, work, or school?: No Unable to ask?: No  How often do you need to have someone help you when you read instructions, pamphlets, or other written materials from your doctor or pharmacy?: 1 -  Never  Interpreter Needed?: No  Information entered by :: Janalyn Shyalandra Lemma Tetro, LPN  Past Medical History:  Diagnosis Date  . ALLERGIC RHINITIS   . Allergy   . ANXIETY   . ARM PAIN, RIGHT   . BENIGN POSITIONAL VERTIGO   . Carcinoma in situ of colon   . CONSTIPATION   . CYSTOCELE WITH INCOMPLETE UTERINE PROLAPSE   . ESOPHAGEAL STRICTURE   . FATIGUE   . FLANK PAIN, LEFT   . Headache(784.0)   . HIP PAIN, LEFT   . HYPERCHOLESTEROLEMIA   . HYPERGLYCEMIA   . HYPERTENSION, BENIGN ESSENTIAL 03/03/2007  . LEG EDEMA, BILATERAL   . Microscopic hematuria   . OBESITY   . OSTEOARTHRITIS, KNEE   . OSTEOPOROSIS   . Peripheral neuropathy, idiopathic   . Problems with swallowing and mastication   . SINUSITIS, RECURRENT   . SPRAIN&STRAIN OTH SPEC SITES SHOULDER&UPPER ARM    Past Surgical History:  Procedure Laterality Date  . LEFT HEART CATHETERIZATION WITH CORONARY ANGIOGRAM N/A 12/02/2010   Procedure: LEFT HEART CATHETERIZATION WITH CORONARY ANGIOGRAM;  Surgeon: Peter M SwazilandJordan, MD;  [facility];  Service: Cardiovascular;  Laterality: N/A;   Family History  Problem Relation Age of Onset  . Coronary artery disease Mother [id number]  . Cancer Father 2269  . Heart disease Father   . Colon cancer Unknown   . Diabetes Unknown   . Coronary artery disease Sister   . Diabetes type II Sister   . Cancer Sister   . Coronary artery disease Brother   . Kidney disease Brother   .  Coronary artery disease Brother    Social History   Socioeconomic History  . Marital status: Single    Spouse name: None  . Number of children: 1  . Years of education: 40  . Highest education level: High school graduate  Social Needs  . Financial resource strain: Not very hard  . Food insecurity - worry: Never true  . Food insecurity - inability: Never true  . Transportation needs - medical: No  . Transportation needs - non-medical: No  Occupational History  . None  Tobacco Use  . Smoking status: Never  Smoker  . Smokeless tobacco: Never Used  Substance and Sexual Activity  . Alcohol use: No  . Drug use: No  . Sexual activity: Not Currently  Other Topics Concern  . None  Social History Narrative  . None    Outpatient Encounter Medications as of 12/25/2016  Medication Sig  . ALPRAZolam (XANAX) 0.5 MG tablet Take 1-2 tablets (0.5-1 mg total) by mouth at bedtime.  Marland Kitchen amitriptyline (ELAVIL) 10 MG tablet Take 1 tablet (10 mg total) by mouth at bedtime.  Marland Kitchen aspirin EC 81 MG tablet Take 1 tablet (81 mg total) by mouth daily.  Marland Kitchen azelastine (ASTELIN) 0.1 % nasal spray Place 1 spray into both nostrils 2 (two) times daily. Use in each nostril as directed  . Cholecalciferol (VITAMIN D3) 5000 units CAPS Take 1 capsule by mouth daily.  . cyanocobalamin 500 MCG tablet Take 500 mcg by mouth daily. Reported on 07/02/2015  . escitalopram (LEXAPRO) 20 MG tablet TAKE 1 TABLET BY MOUTH ONCE DAILY  . gabapentin (NEURONTIN) 300 MG capsule take 1 capsule by mouth three times a day  . glucose blood test strip 1 each by Other route daily. Use as instructed  . HYDROcodone-acetaminophen (NORCO/VICODIN) 5-325 MG tablet Take 1-2 tablets by mouth every 4 (four) hours as needed for moderate pain.  Marland Kitchen ipratropium (ATROVENT) 0.03 % nasal spray instill 2 sprays into each nostril four times a day for congestion  . lisinopril (PRINIVIL,ZESTRIL) 40 MG tablet take 1 tablet by mouth once daily  . loratadine (CLARITIN) 10 MG tablet Take 1 tablet (10 mg total) by mouth at bedtime.  . meclizine (ANTIVERT) 25 MG tablet Take 1 tablet (25 mg total) by mouth 3 (three) times daily.  . montelukast (SINGULAIR) 10 MG tablet Take 1 tablet (10 mg total) by mouth at bedtime.  Marland Kitchen omeprazole (PRILOSEC) 20 MG capsule Take 1 capsule (20 mg total) by mouth 2 (two) times daily before a meal. As needed for heartburn  . triamcinolone (NASACORT) 55 MCG/ACT AERO nasal inhaler Place 2 sprays into the nose daily.   No facility-administered encounter  medications on file as of 12/25/2016.     Activities of Daily Living In your present state of health, do you have any difficulty performing the following activities: 12/25/2016  Hearing? Y  Comment Patient has issues with her left ear  Vision? Y  Comment Patient has vision issues with her glasses  Difficulty concentrating or making decisions? Y  Comment Patient has issues with remembering things  Walking or climbing stairs? Y  Comment Pateint has issues with knee pain   Dressing or bathing? N  Doing errands, shopping? N  Preparing Food and eating ? N  Using the Toilet? N  In the past six months, have you accidently leaked urine? N  Do you have problems with loss of bowel control? N  Managing your Medications? N  Managing your Finances? N  Housekeeping or  managing your Housekeeping? N  Some recent data might be hidden    Timed Get Up and Go performed: yes, passed, completed within 30 seconds  Patient Care Team: Sherren MochaShaw, Eva N, MD as PCP - General (Family Medicine) Jethro BolusShapiro, Mark, MD as Consulting Physician (Ophthalmology)    Assessment:    Exercise Activities and Dietary recommendations Current Exercise Habits: The patient does not participate in regular exercise at present, Exercise limited by: None identified  Goals    . improve memory     Patient wants to try to start helping to improve her memory.      Fall Risk Fall Risk  12/25/2016 09/16/2016 07/06/2016 03/30/2016 12/27/2015  Falls in the past year? No No Yes No No  Number falls in past yr: - - 1 - -  Injury with Fall? - - No - -   Is the patient's home free of loose throw rugs in walkways, pet beds, electrical cords, etc?  yes      Grab bars in the bathroom? no      Handrails on the stairs?   no stairs      Adequate lighting?   yes  Depression Screen PHQ 2/9 Scores 12/25/2016 09/16/2016 07/06/2016 03/30/2016  PHQ - 2 Score 1 0 0 0  PHQ- 9 Score - - - -     Cognitive Function     6CIT Screen 12/25/2016  What Year?  0 points  What month? 0 points  What time? 0 points  Count back from 20 0 points  Months in reverse 4 points  Repeat phrase 10 points  Total Score 14    Immunization History  Administered Date(s) Administered  . Influenza Split 10/20/2013  . Influenza Whole 01/10/2008, 12/28/2008, 12/02/2009  . Influenza, Seasonal, Injecte, Preservative Fre 11/23/2012  . Influenza,inj,Quad PF,6+ Mos 12/24/2015, 09/16/2016  . Influenza-Unspecified 10/09/2011, 10/19/2014  . Pneumococcal Conjugate-13 10/20/2013  . Pneumococcal Polysaccharide-23 03/24/2007  . Td 09/28/2007   Screening Tests Health Maintenance  Topic Date Due  . HEMOGLOBIN A1C  01/05/2017  . OPHTHALMOLOGY EXAM  05/18/2017  . FOOT EXAM  07/06/2017  . TETANUS/TDAP  09/27/2017  . INFLUENZA VACCINE  Completed  . DEXA SCAN  Completed  . PNA vac Low Risk Adult  Completed   Cancer Screenings: Lung:  Low Dose CT Chest recommended if Age 65-80 years, 30 pack-year currently smoking OR have quit w/in 15years. Patient does not qualify. Breast: Up to date on Mammogram? Yes, completed 06/29/2016 Up to date of Bone Density/Dexa? No, Patient declined Colorectal: up to date, completed 02/12/2010  Additional Screenings:  Hepatitis B/HIV/Syphillis:not indicated  Hepatitis C Screening: not indicated     Plan:   I have personally reviewed and noted the following in the patient's chart:   . Medical and social history . Use of alcohol, tobacco or illicit drugs  . Current medications and supplements . Functional ability and status . Nutritional status . Physical activity . Advanced directives . List of other physicians . Hospitalizations, surgeries, and ER visits in previous 12 months . Vitals . Screenings to include cognitive, depression, and falls . Referrals and appointments  In addition, I have reviewed and discussed with patient certain preventive protocols, quality metrics, and best practice recommendations. A written personalized  care plan for preventive services as well as general preventive health recommendations were provided to patient.  Patient declined bone density.  1. Pure hypercholesterolemia - Lipid panel  2. Diabetes mellitus without complication (HCC) - Hemoglobin A1c - Urinalysis, Complete -  Microalbumin, urine  3. Dental caries - Ambulatory referral to C3 Care Team  4. Encounter for Medicare annual wellness exam    Janalyn Shy, LPN  16/01/958

## 2016-12-26 LAB — LIPID PANEL
CHOL/HDL RATIO: 3.4 ratio (ref 0.0–4.4)
CHOLESTEROL TOTAL: 250 mg/dL — AB (ref 100–199)
HDL: 74 mg/dL (ref >39)
LDL CALC: 157 mg/dL — AB (ref 0–99)
Triglycerides: 95 mg/dL (ref 0–149)
VLDL CHOLESTEROL CAL: 19 mg/dL (ref 5–40)

## 2016-12-26 LAB — HEMOGLOBIN A1C
ESTIMATED AVERAGE GLUCOSE: 197 mg/dL
HEMOGLOBIN A1C: 8.5 % — AB (ref 4.8–5.6)

## 2016-12-26 LAB — URINALYSIS, COMPLETE
Bilirubin, UA: NEGATIVE
GLUCOSE, UA: NEGATIVE
KETONES UA: NEGATIVE
LEUKOCYTES UA: NEGATIVE
NITRITE UA: NEGATIVE
SPEC GRAV UA: 1.021 (ref 1.005–1.030)
Urobilinogen, Ur: 0.2 mg/dL (ref 0.2–1.0)
pH, UA: 6 (ref 5.0–7.5)

## 2016-12-26 LAB — MICROSCOPIC EXAMINATION
BACTERIA UA: NONE SEEN
CASTS: NONE SEEN /LPF

## 2016-12-26 LAB — MICROALBUMIN, URINE: Microalbumin, Urine: 115.9 ug/mL

## 2016-12-30 ENCOUNTER — Encounter: Payer: Self-pay | Admitting: Family Medicine

## 2016-12-30 ENCOUNTER — Ambulatory Visit (INDEPENDENT_AMBULATORY_CARE_PROVIDER_SITE_OTHER): Payer: Medicare Other | Admitting: Family Medicine

## 2016-12-30 ENCOUNTER — Other Ambulatory Visit: Payer: Self-pay

## 2016-12-30 VITALS — BP 172/90 | HR 87 | Temp 98.5°F | Resp 18 | Ht 62.68 in | Wt 209.6 lb

## 2016-12-30 DIAGNOSIS — E7800 Pure hypercholesterolemia, unspecified: Secondary | ICD-10-CM

## 2016-12-30 DIAGNOSIS — I10 Essential (primary) hypertension: Secondary | ICD-10-CM | POA: Diagnosis not present

## 2016-12-30 DIAGNOSIS — R809 Proteinuria, unspecified: Secondary | ICD-10-CM

## 2016-12-30 DIAGNOSIS — J329 Chronic sinusitis, unspecified: Secondary | ICD-10-CM

## 2016-12-30 DIAGNOSIS — H8110 Benign paroxysmal vertigo, unspecified ear: Secondary | ICD-10-CM | POA: Diagnosis not present

## 2016-12-30 DIAGNOSIS — R3121 Asymptomatic microscopic hematuria: Secondary | ICD-10-CM | POA: Diagnosis not present

## 2016-12-30 DIAGNOSIS — E1129 Type 2 diabetes mellitus with other diabetic kidney complication: Secondary | ICD-10-CM | POA: Diagnosis not present

## 2016-12-30 DIAGNOSIS — R3129 Other microscopic hematuria: Secondary | ICD-10-CM | POA: Diagnosis not present

## 2016-12-30 DIAGNOSIS — H5712 Ocular pain, left eye: Secondary | ICD-10-CM

## 2016-12-30 DIAGNOSIS — D649 Anemia, unspecified: Secondary | ICD-10-CM

## 2016-12-30 DIAGNOSIS — R51 Headache: Secondary | ICD-10-CM | POA: Diagnosis not present

## 2016-12-30 DIAGNOSIS — M170 Bilateral primary osteoarthritis of knee: Secondary | ICD-10-CM

## 2016-12-30 DIAGNOSIS — K222 Esophageal obstruction: Secondary | ICD-10-CM | POA: Diagnosis not present

## 2016-12-30 DIAGNOSIS — R519 Headache, unspecified: Secondary | ICD-10-CM

## 2016-12-30 LAB — POCT URINALYSIS DIP (MANUAL ENTRY)
BILIRUBIN UA: NEGATIVE
BILIRUBIN UA: NEGATIVE mg/dL
GLUCOSE UA: NEGATIVE mg/dL
LEUKOCYTES UA: NEGATIVE
Nitrite, UA: NEGATIVE
PH UA: 6 (ref 5.0–8.0)
Protein Ur, POC: 100 mg/dL — AB
Spec Grav, UA: >=1.03 — AB (ref 1.010–1.025)
Urobilinogen, UA: 0.2 E.U./dL

## 2016-12-30 LAB — POC MICROSCOPIC URINALYSIS (UMFC): Mucus: ABSENT

## 2016-12-30 MED ORDER — AMLODIPINE BESYLATE 5 MG PO TABS: 5.0000 mg | ORAL_TABLET | Freq: Every day | ORAL | 0 refills | Status: DC

## 2016-12-30 MED ORDER — METFORMIN HCL 500 MG PO TABS: 500.0000 mg | ORAL_TABLET | Freq: Two times a day (BID) | ORAL | 0 refills | Status: DC

## 2016-12-30 MED ORDER — GABAPENTIN 300 MG PO CAPS: ORAL_CAPSULE | ORAL | 1 refills | Status: DC

## 2016-12-30 MED ORDER — NORTRIPTYLINE HCL 25 MG PO CAPS: 25.0000 mg | ORAL_CAPSULE | Freq: Every day | ORAL | 1 refills | Status: DC

## 2016-12-30 MED ORDER — HYDROCODONE-ACETAMINOPHEN 5-325 MG PO TABS: 1.0000 | ORAL_TABLET | ORAL | 0 refills | Status: DC | PRN

## 2016-12-30 NOTE — Patient Instructions (Addendum)
ONLY TAKE THE ALPRAZOLAM AT NIGHT - YOU CAN TAKE ONE OR TWO TABS TO HELP YOU SLEEP. WE HAVE CHANGED THE AMITRYPTILINE 10MG  TO NORTRYPTILINE 25MG  AT NIGHT TO HELP YOU SLEEP AND HELP WITH PAIN. YOU CAN TAKE ACETAMINOPHEN/TYLENOL, HYDROCODONE, AND GABAPENTIN ALL 4 TIMES A DAY AS NEEDED FOR PAIN BUT: DO NOT TAKE HYDROCODONE PAIN PILLS AT NIGHT WITH THE ALPRAZOLAM/XANAX - VERY VERY DANGEROUS COMBINATION CAN CAUSE YOU TO STOP BREATHING WHILE YOU SLEEP. CONTINUE LISINOPRIL FOR BLOOD PRESSURE. START NEW MEDICINE AMLODIPINE FOR BLOOD PRESSURE. START NEW MEDICINE METFORMIN FOR BLOOD SUGAR. RECHECK W/ ME IN 3 WEEKS - NOT FASTING, TAKE YOUR MEDICINE THAT MORNING. BRING ALL OF YOUR PILLS WITH YOU.    IF you received an x-ray today, you will receive an invoice from [facility]. Please contact [facility] at [phone] with questions or concerns regarding your invoice.   IF you received labwork today, you will receive an invoice from [facility]. Please contact [facility] at [phone] with questions or concerns regarding your invoice.   Our billing staff will not be able to assist you with questions regarding bills from these companies.  You will be contacted with the lab results as soon as they are available. The fastest way to get your results is to activate your My Chart account. Instructions are located on the last page of this paperwork. If you have not heard from us regarding the results in 2 weeks, please contact this office.

## 2016-12-30 NOTE — Progress Notes (Addendum)
Subjective:  This chart was scribed for [name], MD by Tamsen Roers, at [facility].  This patient was seen in room 3 and the patient's care was started at 11:25 AM.   Chief Complaint  Patient presents with  . Medication Refill    Hydrocodone- acetamin 5-325, Amitriptyline HCL, Omeprazole DR, Mntelukast SOD and Gabapentin 300  . Follow-up    f/u from [facility] and BP  . Depression    screening done     Patient ID: Casey [name], female    DOB: [birth year], 81 y.o.   MRN: [id number]  HPI HPI Comments: Casey [name] is a 81 y.o. female who presents to Primary Care at [facility] for a follow up. Patient had her annual wellness visit five days prior with the [facility] and is here to follow up on her chronic medical conditions.  All health maintenance up to date other than Dexa scan which patient declined.    DMII: Diagnosed summer 2017 - 1 yr prior - trying diet changes initially so held off on starting any medication.Does not eat many sweets and tries to stick to sugar free. She avoids starchy food like potatoes, pasta, white bread. However, a1c has been slowly but steadily increasing. From 6.8 to peak of 8.5 at [facility] five days ago.  Started asa 13m qd. +urine microalb/ 12/2016 so started acei Dec 2017 with last eGFR 68 3 mos ago (3 mos after starting acei)! Not checking cbgs as we have not refilled her strips. Having worsening distal lower ext neuropathy on gabapentin 300 tid. Sees podiatry occ. Foot exam done 08/21/2015. Seen by optho Dr. Shaprio4/30/2018. Pneumonia and flu vaccines are UTD.      Lab Results  Component Value Date   HGBA1C 6.9 03/30/2016   HGBA1C 6.7 12/24/2015   HGBA1C 6.6 (H) 07/02/2015   HGBA1C 6.2 10/11/2014  Patient states that she has not been able to check her blood sugar at home as she is not sure how to use her device. Patient is having eye pain and would like to be referred to a new eye doctor.    HLD: uncontrolled ASCVD risk increased  form 18% to 26% since she came of statin last yr but her knee pain improved so sig she does not want to restart a statin. She is taking a fish oil supplement? Lipid Panel non-hdl 176 not on any chol med Labs(Brief)          Component Value Date/Time   CHOL 224 (H) 12/24/2015 0939   TRIG 63 12/24/2015 0939   HDL 72 12/24/2015 0939   CHOLHDL 3.1 12/24/2015 0939   CHOLHDL 3.6 10/11/2014 1129   VLDL 16 10/11/2014 1129   LDLCALC 139 (H) 12/24/2015 0939      HTN: Lisinopril 40 started 6 mos prior due to + urine microalb and had pt stop maxzide and K.--- Patient denies any dry mouth or constipation.    Insomnia: on xanax 131mqhs for decades - since 1980s.    Depression/Anxiety: lexapro 20 mg--- Patient takes her Xanax (one pill) every night and takes one in the morning as well.  She states that she has been taking two pain pills with the Xanax in the mornings. She has not been using her Lexapro and states that she does not have an increase in depression.   [facility] 12/30/2016 12/25/2016 09/16/2016 07/06/2016 03/30/2016  Decreased Interest 2 0 0 0 0  Down, Depressed, Hopeless 1 1 0 0  0  PHQ - 2 Score 3 1 0 0 0  Altered sleeping 2 - - - -  Tired, decreased energy 3 - - - -  Change in appetite 0 - - - -  Feeling bad or failure about yourself  1 - - - -  Trouble concentrating 0 - - - -  Moving slowly or fidgety/restless 1 - - - -  Suicidal thoughts 1 - - - -  PHQ-9 Score 11 - - - -  Difficult doing work/chores Not difficult at all - - - -    Arthritis: B Knee pain chronic but sig improved since stopping statin. Avoids nsaids as instructed.Has been on hyrdocodone bid for years but really only takes qhs when in bed to sleep. Last rx for #60 filled 20d prior - pt has used 40 pills - pill count today. Left lower ext pain, resolved: suspected claudication so Referred to vascular 08/2015 but pt reported that pain resolved so never went  DEXA scan done 01/02/2009  showed T score of 0.3 in her Rt femur and 0.7 in her L spine so was normal. - none since ,Pt just had [facility] mamm[facility].  H/o Vitamin D def: on 1000u qd vit D, no calcium supplements      Lab Results  Component Value Date   VD25OH 34 11/20/2013   VD25OH 50 10/23/2011  Patient takes her pain medication (2) in the mornings for her knee pain.     GERD with H/o esophageal structure: On ppi omeprazole 20 bid. Had dilation prior by Dr. Amedeo Plenty. Had constipation from iron and hydrocodone resolved with miralax and docusate last summer.  However, then all fall she suffered from nausea, abd pain, diarrhea worse with food.  Bowels are now regular with no problems.   Chronic vertigo: prn meclizine - takes at least3 times daily by her refill frequency or else the vertigo comes back immediately.--- She denies any changes in her vertigo.    Microscopic hematuria confirmed on send out, has not been worked up prior.    Allergies: Patient was told that she has "chronic allergies".  She has a refill on her allergy medication and states that she has been compliant with it.  She also has intermittent headaches which she thinks may be due to her allergies.    Patient denies any heart burn.   Past Medical History:  Diagnosis Date  . ALLERGIC RHINITIS   . Allergy   . ANXIETY   . ARM PAIN, RIGHT   . BENIGN POSITIONAL VERTIGO   . Carcinoma in situ of colon   . CONSTIPATION   . CYSTOCELE WITH INCOMPLETE UTERINE PROLAPSE   . ESOPHAGEAL STRICTURE   . FATIGUE   . FLANK PAIN, LEFT   . Headache(784.0)   . HIP PAIN, LEFT   . HYPERCHOLESTEROLEMIA   . HYPERGLYCEMIA   . HYPERTENSION, BENIGN ESSENTIAL 03/03/2007  . LEG EDEMA, BILATERAL   . Microscopic hematuria   . OBESITY   . OSTEOARTHRITIS, KNEE   . OSTEOPOROSIS   . Peripheral neuropathy, idiopathic   . Problems with swallowing and mastication   . SINUSITIS, RECURRENT   . SPRAIN&STRAIN OTH SPEC SITES SHOULDER&UPPER ARM     Current Outpatient Medications on File Prior to Visit  Medication Sig Dispense Refill  . ALPRAZolam (XANAX) 0.5 MG tablet Take 1-2 tablets (0.5-1 mg total) by mouth at bedtime. 180 tablet 1  . amitriptyline (ELAVIL) 10 MG tablet Take 1 tablet (10 mg  total) by mouth at bedtime. 90 tablet 1  . aspirin EC 81 MG tablet Take 1 tablet (81 mg total) by mouth daily.    Marland Kitchen azelastine (ASTELIN) 0.1 % nasal spray Place 1 spray into both nostrils 2 (two) times daily. Use in each nostril as directed 30 mL 2  . Cholecalciferol (VITAMIN D3) 5000 units CAPS Take 1 capsule by mouth daily.    . cyanocobalamin 500 MCG tablet Take 500 mcg by mouth daily. Reported on 07/02/2015    . escitalopram (LEXAPRO) 20 MG tablet TAKE 1 TABLET BY MOUTH ONCE DAILY 30 tablet 5  . gabapentin (NEURONTIN) 300 MG capsule take 1 capsule by mouth three times a day 270 capsule 1  . glucose blood test strip 1 each by Other route daily. Use as instructed 50 each 11  . HYDROcodone-acetaminophen (NORCO/VICODIN) 5-325 MG tablet Take 1-2 tablets by mouth every 4 (four) hours as needed for moderate pain. 120 tablet 0  . ipratropium (ATROVENT) 0.03 % nasal spray instill 2 sprays into each nostril four times a day for congestion 30 mL 1  . lisinopril (PRINIVIL,ZESTRIL) 40 MG tablet take 1 tablet by mouth once daily 90 tablet 1  . loratadine (CLARITIN) 10 MG tablet Take 1 tablet (10 mg total) by mouth at bedtime. 90 tablet 1  . meclizine (ANTIVERT) 25 MG tablet Take 1 tablet (25 mg total) by mouth 3 (three) times daily. 270 tablet 1  . montelukast (SINGULAIR) 10 MG tablet Take 1 tablet (10 mg total) by mouth at bedtime. 30 tablet 3  . omeprazole (PRILOSEC) 20 MG capsule Take 1 capsule (20 mg total) by mouth 2 (two) times daily before a meal. As needed for heartburn 60 capsule 3  . triamcinolone (NASACORT) 55 MCG/ACT AERO nasal inhaler Place 2 sprays into the nose daily. 1 Inhaler 2   No current facility-administered medications on file prior to  visit.     No Known Allergies  Past Surgical History:  Procedure Laterality Date  . LEFT HEART CATHETERIZATION WITH CORONARY ANGIOGRAM N/A 12/02/2010   Procedure: LEFT HEART CATHETERIZATION WITH CORONARY ANGIOGRAM;  Surgeon: Peter M Martinique, MD;  Location: [facility] CATH LAB;  Service: Cardiovascular;  Laterality: N/A;   Social History   Socioeconomic History  . Marital status: Single    Spouse name: None  . Number of children: 1  . Years of education: 32  . Highest education level: High school graduate  Social Needs  . Financial resource strain: Not very hard  . Food insecurity - worry: Never true  . Food insecurity - inability: Never true  . Transportation needs - medical: No  . Transportation needs - non-medical: No  Occupational History  . None  Tobacco Use  . Smoking status: Never Smoker  . Smokeless tobacco: Never Used  Substance and Sexual Activity  . Alcohol use: No  . Drug use: No  . Sexual activity: Not Currently  Other Topics Concern  . None  Social History Narrative  . None   Family History  Problem Relation Age of Onset  . Coronary artery disease Mother 57  . Cancer Father 50  . Heart disease Father   . Colon cancer Unknown   . Diabetes Unknown   . Coronary artery disease Sister   . Diabetes type II Sister   . Cancer Sister   . Coronary artery disease Brother   . Kidney disease Brother   . Coronary artery disease Brother    Depression screen [facility]/9 12/30/2016 12/25/2016 09/16/2016  07/06/2016 03/30/2016  Decreased Interest 2 0 0 0 0  Down, Depressed, Hopeless 1 1 0 0 0  PHQ - 2 Score 3 1 0 0 0  Altered sleeping 2 - - - -  Tired, decreased energy 3 - - - -  Change in appetite 0 - - - -  Feeling bad or failure about yourself  1 - - - -  Trouble concentrating 0 - - - -  Moving slowly or fidgety/restless 1 - - - -  Suicidal thoughts 1 - - - -  PHQ-9 Score 11 - - - -  Difficult doing work/chores Not difficult at all - - - -     Review of Systems    Constitutional: Negative for chills and fever.  Eyes: Positive for pain. Negative for redness.  Respiratory: Negative for cough, choking and shortness of breath.   Gastrointestinal: Negative for nausea and vomiting.  Musculoskeletal: Positive for arthralgias. Negative for neck pain and neck stiffness.  Neurological: Positive for headaches. Negative for syncope.       Objective:   Physical Exam  Constitutional: No distress.  HENT:  Head: Normocephalic and atraumatic.  Cardiovascular: Normal rate, regular rhythm and normal heart sounds.  Bowel sounds heard in chest.   Pulmonary/Chest: Effort normal and breath sounds normal. No respiratory distress. She has no wheezes. She has no rales.  Skin: Skin is warm and dry.    Vitals:   12/30/16 1052  BP: (!) 172/90  Pulse: 87  Resp: 18  Temp: 98.5 F (36.9 C)  TempSrc: Oral  SpO2: 96%  Weight: 209 lb 9.6 oz (95.1 kg)  Height: 5' 2.68" (1.592 m)   EKG: Normal sinus rhythm, no acute changes. Agree with computer reading.   EKG: NSR, no acute ischemic changes noted. No significant change noted when compared to prior EKG done 12/24/2015.   I have personally reviewed the EKG tracing and agree with the computer interpretation: Sinus Rhythm -Left atrial enlargement.  - Diffuse nonspecific T-abnormality  ABNORMAL     Assessment & Plan:   1. Type 2 diabetes mellitus with microalbuminuria, without long-term current use of insulin ([place])   2. Asymptomatic microscopic hematuria   3. HYPERCHOLESTEROLEMIA   4. Microscopic hematuria   5. HYPERTENSION, BENIGN ESSENTIAL - Hold off on Lisinopril refill as may change to Olmesartan at follow up so can be combined with new Amlodipine for more effect.   6. Primary osteoarthritis of both knees   7. Acute nonintractable headache, unspecified headache type   8. Anemia, unspecified type   9. Benign paroxysmal positional vertigo, unspecified laterality   10. ESOPHAGEAL STRICTURE   11. Chronic  sinusitis, unspecified location   12. Eye pain, left     Orders Placed This Encounter  Procedures  . Urine Culture  . Comprehensive metabolic panel  . CBC with Differential/Platelet  . Ambulatory referral to Ophthalmology    Referral Priority:   Routine    Referral Type:   Consultation    Referral Reason:   Second Opinion    Requested Specialty:   Ophthalmology    Number of Visits Requested:   1  . POCT urinalysis dipstick  . POCT Microscopic Urinalysis ([facility])  . EKG 12-Lead    Meds ordered this encounter  Medications  . nortriptyline (PAMELOR) 25 MG capsule    Sig: Take 1 capsule (25 mg total) by mouth at bedtime.    Dispense:  90 capsule    Refill:  1  . gabapentin (NEURONTIN) 300  MG capsule    Sig: take 1 capsule by mouth three times a day    Dispense:  270 capsule    Refill:  1  . HYDROcodone-acetaminophen (NORCO/VICODIN) 5-325 MG tablet    Sig: Take 1-2 tablets by mouth every 4 (four) hours as needed for moderate pain.    Dispense:  120 tablet    Refill:  0  . amLODipine (NORVASC) 5 MG tablet    Sig: Take 1 tablet (5 mg total) by mouth daily.    Dispense:  90 tablet    Refill:  0  . metFORMIN (GLUCOPHAGE) 500 MG tablet    Sig: Take 1 tablet (500 mg total) by mouth 2 (two) times daily with a meal.    Dispense:  180 tablet    Refill:  0    I personally performed the services described in this documentation, which was scribed in my presence. The recorded information has been reviewed and considered, and addended by me as needed.   Delman Cheadle, M.D.  Primary Care at [facility] [address] [place], [place] [address] [phone] phone [phone] fax  01/01/17 6:40 PM

## 2016-12-31 LAB — CBC WITH DIFFERENTIAL/PLATELET
BASOS: 1 %
Basophils Absolute: 0.1 10*3/uL (ref 0.0–0.2)
EOS (ABSOLUTE): 0.1 10*3/uL (ref 0.0–0.4)
EOS: 1 %
HEMATOCRIT: 36 % (ref 34.0–46.6)
Hemoglobin: 12 g/dL (ref 11.1–15.9)
IMMATURE GRANS (ABS): 0 10*3/uL (ref 0.0–0.1)
Immature Granulocytes: 0 %
LYMPHS: 47 %
Lymphocytes Absolute: 3.5 10*3/uL — ABNORMAL HIGH (ref 0.7–3.1)
MCH: 27.6 pg (ref 26.6–33.0)
MCHC: 33.3 g/dL (ref 31.5–35.7)
MCV: 83 fL (ref 79–97)
MONOS ABS: 0.5 10*3/uL (ref 0.1–0.9)
Monocytes: 6 %
NEUTROS ABS: 3.4 10*3/uL (ref 1.4–7.0)
Neutrophils: 45 %
PLATELETS: 275 10*3/uL (ref 150–379)
RBC: 4.34 x10E6/uL (ref 3.77–5.28)
RDW: 14.8 % (ref 12.3–15.4)
WBC: 7.6 10*3/uL (ref 3.4–10.8)

## 2016-12-31 LAB — COMPREHENSIVE METABOLIC PANEL
A/G RATIO: 1.5 (ref 1.2–2.2)
ALT: 9 IU/L (ref 0–32)
AST: 13 IU/L (ref 0–40)
Albumin: 4.3 g/dL (ref 3.5–4.8)
Alkaline Phosphatase: 99 IU/L (ref 39–117)
BUN/Creatinine Ratio: 18 (ref 12–28)
BUN: 15 mg/dL (ref 8–27)
Bilirubin Total: 0.3 mg/dL (ref 0.0–1.2)
CALCIUM: 9.4 mg/dL (ref 8.7–10.3)
CO2: 26 mmol/L (ref 20–29)
Chloride: 104 mmol/L (ref 96–106)
Creatinine, Ser: 0.85 mg/dL (ref 0.57–1.00)
GFR, EST AFRICAN AMERICAN: 75 mL/min/{1.73_m2} (ref >59)
GFR, EST NON AFRICAN AMERICAN: 65 mL/min/{1.73_m2} (ref >59)
GLOBULIN, TOTAL: 2.9 g/dL (ref 1.5–4.5)
Glucose: 136 mg/dL — ABNORMAL HIGH (ref 65–99)
POTASSIUM: 3.9 mmol/L (ref 3.5–5.2)
SODIUM: 142 mmol/L (ref 134–144)
TOTAL PROTEIN: 7.2 g/dL (ref 6.0–8.5)

## 2016-12-31 LAB — CYTOLOGY, URINE

## 2016-12-31 LAB — URINE CULTURE

## 2017-01-06 ENCOUNTER — Telehealth: Payer: Self-pay | Admitting: Family Medicine

## 2017-01-06 NOTE — Telephone Encounter (Signed)
Tried calling pt to see where her last eye

## 2017-01-19 ENCOUNTER — Telehealth: Payer: Self-pay

## 2017-01-19 NOTE — Telephone Encounter (Signed)
Patient answered second attempt but phone connection was poor and patient kept repeating that her phone was out of order. Patient has upcoming PCP appointment. I will leave community resource information for her to pick up on 1/3 at this appointment.    Sherle PoeNicole Tysheena Ginzburg, B.A.  Care Guide - [facility] at [facility] [phone]

## 2017-01-19 NOTE — Telephone Encounter (Signed)
Called pt to provide additional community resources that were requested. No answer, and no voicemail. Will try again tomorrow.    Sherle PoeNicole Koen Antilla, B.A.  Care Guide - Primary Care at [facility] [phone]

## 2017-01-21 ENCOUNTER — Ambulatory Visit: Payer: Medicare Other | Admitting: Family Medicine

## 2017-01-29 LAB — HM DIABETES EYE EXAM

## 2017-02-15 ENCOUNTER — Ambulatory Visit (INDEPENDENT_AMBULATORY_CARE_PROVIDER_SITE_OTHER): Payer: Medicare Other | Admitting: Family Medicine

## 2017-02-15 VITALS — BP 154/87 | HR 100 | Temp 98.3°F | Resp 18 | Ht 62.6 in | Wt 206.0 lb

## 2017-02-15 DIAGNOSIS — F411 Generalized anxiety disorder: Secondary | ICD-10-CM | POA: Diagnosis not present

## 2017-02-15 DIAGNOSIS — M170 Bilateral primary osteoarthritis of knee: Secondary | ICD-10-CM | POA: Diagnosis not present

## 2017-02-15 DIAGNOSIS — R809 Proteinuria, unspecified: Secondary | ICD-10-CM

## 2017-02-15 DIAGNOSIS — F5105 Insomnia due to other mental disorder: Secondary | ICD-10-CM | POA: Diagnosis not present

## 2017-02-15 DIAGNOSIS — R3129 Other microscopic hematuria: Secondary | ICD-10-CM | POA: Diagnosis not present

## 2017-02-15 DIAGNOSIS — F99 Mental disorder, not otherwise specified: Secondary | ICD-10-CM | POA: Diagnosis not present

## 2017-02-15 DIAGNOSIS — E1129 Type 2 diabetes mellitus with other diabetic kidney complication: Secondary | ICD-10-CM | POA: Diagnosis not present

## 2017-02-15 LAB — GLUCOSE, POCT (MANUAL RESULT ENTRY): POC Glucose: 125 mg/dl — AB (ref 70–99)

## 2017-02-15 LAB — POCT URINALYSIS DIP (MANUAL ENTRY)
Bilirubin, UA: NEGATIVE
GLUCOSE UA: NEGATIVE mg/dL
Leukocytes, UA: NEGATIVE
NITRITE UA: NEGATIVE
PH UA: 5.5 (ref 5.0–8.0)
Protein Ur, POC: 100 mg/dL — AB
Spec Grav, UA: 1.01 (ref 1.010–1.025)
UROBILINOGEN UA: 0.2 U/dL

## 2017-02-15 LAB — POC MICROSCOPIC URINALYSIS (UMFC): Mucus: ABSENT

## 2017-02-15 MED ORDER — NORTRIPTYLINE HCL 25 MG PO CAPS: 50.0000 mg | ORAL_CAPSULE | Freq: Every day | ORAL | 0 refills | Status: DC

## 2017-02-15 MED ORDER — HYDROCODONE-ACETAMINOPHEN 5-325 MG PO TABS
1.0000 | ORAL_TABLET | Freq: Four times a day (QID) | ORAL | 0 refills | Status: DC | PRN
Start: 2017-02-15 — End: 2017-03-29

## 2017-02-15 MED ORDER — ALPRAZOLAM 0.25 MG PO TABS: 0.2500 mg | ORAL_TABLET | Freq: Two times a day (BID) | ORAL | 0 refills | Status: DC | PRN

## 2017-02-15 MED ORDER — LISINOPRIL 40 MG PO TABS
40.0000 mg | ORAL_TABLET | Freq: Every day | ORAL | 1 refills | Status: DC
Start: 2017-02-15 — End: 2017-07-01

## 2017-02-15 NOTE — Patient Instructions (Addendum)
Increase the nortriptyline to 2 tabs every night. If this is not helping for sleep, try taking 3 tabs every night.  Only take the alprazolam (xanax) when you are having an anxiety attack.  We have to get you off of this because you are using many more pain pills now. We will need to find something else for you to use for your tremor and sleep.   Start a daily fiber supplement like psyllium husk, metamucil, or benefiber.  Try almond milk with a bowel of high fiber cereal.  Please bring ALL of your medications with your to your next visit.  Restart your lisinopril. Continue the amlodipine. Continue the metformin.   IF you received an x-ray today, you will receive an invoice from [facility]. Please contact [facility] at [phone] with questions or concerns regarding your invoice.   IF you received labwork today, you will receive an invoice from [facility]. Please contact [facility] at [phone] with questions or concerns regarding your invoice.   Our billing staff will not be able to assist you with questions regarding bills from these companies.  You will be contacted with the lab results as soon as they are available. The fastest way to get your results is to activate your My Chart account. Instructions are located on the last page of this paperwork. If you have not heard from [name] regarding the results in 2 weeks, please contact this office.     Living With Anxiety After being diagnosed with an anxiety disorder, you may be relieved to know why you have felt or behaved a certain way. It is natural to also feel overwhelmed about the treatment ahead and what it will mean for your life. With care and support, you can manage this condition and recover from it. How to cope with anxiety Dealing with stress Stress is your body's reaction to life changes and events, both good and bad. Stress can last just a few hours or it can be ongoing. Stress can play a major role in anxiety,  so it is important to learn both how to cope with stress and how to think about it differently. Talk with your health care provider or a counselor to learn more about stress reduction. He or she may suggest some stress reduction techniques, such as:  Music therapy. This can include creating or listening to music that you enjoy and that inspires you.  Mindfulness-based meditation. This involves being aware of your normal breaths, rather than trying to control your breathing. It can be done while sitting or walking.  Centering prayer. This is a kind of meditation that involves focusing on a word, phrase, or sacred image that is meaningful to you and that brings you peace.  Deep breathing. To do this, expand your stomach and inhale slowly through your nose. Hold your breath for 3-5 seconds. Then exhale slowly, allowing your stomach muscles to relax.  Self-talk. This is a skill where you identify thought patterns that lead to anxiety reactions and correct those thoughts.  Muscle relaxation. This involves tensing muscles then relaxing them.  Choose a stress reduction technique that fits your lifestyle and personality. Stress reduction techniques take time and practice. Set aside 5-15 minutes a day to do them. Therapists can offer training in these techniques. The training may be covered by some insurance plans. Other things you can do to manage stress include:  Keeping a stress diary. This can help you learn what triggers your stress and ways to control your response.  Thinking about how you respond to certain situations. You may not be able to control everything, but you can control your reaction.  Making time for activities that help you relax, and not feeling guilty about spending your time in this way.  Therapy combined with coping and stress-reduction skills provides the best chance for successful treatment. Medicines Medicines can help ease symptoms. Medicines for anxiety  include:  Anti-anxiety drugs.  Antidepressants.  Beta-blockers.  Medicines may be used as the main treatment for anxiety disorder, along with therapy, or if other treatments are not working. Medicines should be prescribed by a health care provider. Relationships Relationships can play a big part in helping you recover. Try to spend more time connecting with trusted friends and family members. Consider going to couples counseling, taking family education classes, or going to family therapy. Therapy can help you and others better understand the condition. How to recognize changes in your condition Everyone has a different response to treatment for anxiety. Recovery from anxiety happens when symptoms decrease and stop interfering with your daily activities at home or work. This may mean that you will start to:  Have better concentration and focus.  Sleep better.  Be less irritable.  Have more energy.  Have improved memory.  It is important to recognize when your condition is getting worse. Contact your health care provider if your symptoms interfere with home or work and you do not feel like your condition is improving. Where to find help and support: You can get help and support from these sources:  Self-help groups.  Online and [employer]community organizations.  A trusted spiritual leader.  Couples counseling.  Family education classes.  Family therapy.  Follow these instructions at home:  Eat a healthy diet that includes plenty of vegetables, fruits, whole grains, low-fat dairy products, and lean protein. Do not eat a lot of foods that are high in solid fats, added sugars, or salt.  Exercise. Most adults should do the following: ? Exercise for at least 150 minutes each week. The exercise should increase your heart rate and make you sweat (moderate-intensity exercise). ? Strengthening exercises at least twice a week.  Cut down on caffeine, tobacco, alcohol, and other potentially  harmful substances.  Get the right amount and quality of sleep. Most adults need 7-9 hours of sleep each night.  Make choices that simplify your life.  Take over-the-counter and prescription medicines only as told by your health care provider.  Avoid caffeine, alcohol, and certain over-the-counter cold medicines. These may make you feel worse. Ask your pharmacist which medicines to avoid.  Keep all follow-up visits as told by your health care provider. This is important. Questions to ask your health care provider  Would I benefit from therapy?  How often should I follow up with a health care provider?  How long do I need to take medicine?  Are there any long-term side effects of my medicine?  Are there any alternatives to taking medicine? Contact a health care provider if:  You have a hard time staying focused or finishing daily tasks.  You spend many hours a day feeling worried about everyday life.  You become exhausted by worry.  You start to have headaches, feel tense, or have nausea.  You urinate more than normal.  You have diarrhea. Get help right away if:  You have a racing heart and shortness of breath.  You have thoughts of hurting yourself or others. If you ever feel like you may hurt yourself  or others, or have thoughts about taking your own life, get help right away. You can go to your nearest emergency department or call:  Your local emergency services (911 in the [place]  A suicide crisis helpline, such as the National Suicide Prevention Lifeline at 747-290-9609. This is open 24-hours a day.  Summary  Taking steps to deal with stress can help calm you.  Medicines cannot cure anxiety disorders, but they can help ease symptoms.  Family, friends, and partners can play a big part in helping you recover from an anxiety disorder. This information is not intended to replace advice given to you by your health care provider. Make sure you discuss any  questions you have with your health care provider. Document Released: 12/31/2015 Document Revised: 12/31/2015 Document Reviewed: 12/31/2015 Elsevier Interactive Patient Education  [employer].

## 2017-02-15 NOTE — Progress Notes (Signed)
Subjective:    Patient ID: Casey [name], female    DOB: 01-Aug-1935, 82 y.o.   MRN: [id number] Chief Complaint  Patient presents with  . Medication Management    Discuss medications  . Medication Refill    xanax, hydrocodone    HPI   Casey [name] is an 82 yo woman here today for a 6 weeks follow-up on her chronic medical conditions. HTN: Lisinopril 40 so at last visit added amlodipine 5.   DMII: on 12/7 a1c spiked from 6.8 to peak of 8.5 so at her last OV we started metformin 500 bid, asa 81. HLD: uncontrolled ASCVD risk increased form 18% to 26% since she came of statinlast yr buther knee pain improved so sig she does not want to restart a statin. She is taking a fish oil supplement? Depression/Anxiety: Had actually be taking xanax bid along with 2 tabs hydrocodone qam but had stopped lexaprol. Insomnia: on xanax 1mg  qhs for decades - since 1980s.  Chronic vertigo: prn meclizine - takesat least3 times daily by her refill frequencyor else the vertigo comes back immediately.--- She denies any changes in her vertigo.  Nortriptyline not strong enough.  Feels like poop isn't comeing out all the way. When she bends over she can feels a mass but nothing will move. There all the time. Wonders if if it could be a pulled muscle or a hemorrhoid;  She lifted something heavy an dpulled chest wall muscles - like they were on fire. She used up all her pain pills and has been miserable.  She is using 3-4 hydrocodone daily.  Air hit her head and it was very painful. She has been sick with a cold and hasn't left her house x 2 wks until today.  She has an appointment for blepharoplasty coming up so she will stop her aspirin for 5d prior.   Can't rest laying down so just gets up about 4 a.m.    Past Medical History:  Diagnosis Date  . ALLERGIC RHINITIS   . Allergy   . ANXIETY   . ARM PAIN, RIGHT   . BENIGN POSITIONAL VERTIGO   . Carcinoma in situ of colon   . CONSTIPATION   . CYSTOCELE  WITH INCOMPLETE UTERINE PROLAPSE   . ESOPHAGEAL STRICTURE   . FATIGUE   . FLANK PAIN, LEFT   . Headache(784.0)   . HIP PAIN, LEFT   . HYPERCHOLESTEROLEMIA   . HYPERGLYCEMIA   . HYPERTENSION, BENIGN ESSENTIAL 03/03/2007  . LEG EDEMA, BILATERAL   . Microscopic hematuria   . OBESITY   . OSTEOARTHRITIS, KNEE   . OSTEOPOROSIS   . Peripheral neuropathy, idiopathic   . Problems with swallowing and mastication   . SINUSITIS, RECURRENT   . SPRAIN&STRAIN OTH SPEC SITES SHOULDER&UPPER ARM    Past Surgical History:  Procedure Laterality Date  . LEFT HEART CATHETERIZATION WITH CORONARY ANGIOGRAM N/A 12/02/2010   Procedure: LEFT HEART CATHETERIZATION WITH CORONARY ANGIOGRAM;  Surgeon: Peter M Swaziland, MD;  Location: [facility] CATH LAB;  Service: Cardiovascular;  Laterality: N/A;   Current Outpatient Medications on File Prior to Visit  Medication Sig Dispense Refill  . amLODipine (NORVASC) 5 MG tablet Take 1 tablet (5 mg total) by mouth daily. 90 tablet 0  . aspirin EC 81 MG tablet Take 1 tablet (81 mg total) by mouth daily.    . Cholecalciferol (VITAMIN D3) 5000 units CAPS Take 1 capsule by mouth daily.    Marland Kitchen glucose blood test strip 1 each by  Other route daily. Use as instructed 50 each 11  . meclizine (ANTIVERT) 25 MG tablet Take 1 tablet (25 mg total) by mouth 3 (three) times daily. 270 tablet 1  . metFORMIN (GLUCOPHAGE) 500 MG tablet Take 1 tablet (500 mg total) by mouth 2 (two) times daily with a meal. 180 tablet 0  . nortriptyline (PAMELOR) 25 MG capsule Take 1 capsule (25 mg total) by mouth at bedtime. 90 capsule 1  . tobramycin-dexamethasone (TOBRADEX) ophthalmic solution instill 1 drop into left eye four times a day if needed  0  . triamcinolone (NASACORT) 55 MCG/ACT AERO nasal inhaler Place 2 sprays into the nose daily. 1 Inhaler 2  . ALPRAZolam (XANAX) 0.5 MG tablet Take 1-2 tablets (0.5-1 mg total) by mouth at bedtime. (Patient not taking: Reported on 02/15/2017) 180 tablet 1  . azelastine  (ASTELIN) 0.1 % nasal spray Place 1 spray into both nostrils 2 (two) times daily. Use in each nostril as directed (Patient not taking: Reported on 02/15/2017) 30 mL 2  . cyanocobalamin 500 MCG tablet Take 500 mcg by mouth daily. Reported on 07/02/2015    . HYDROcodone-acetaminophen (NORCO/VICODIN) 5-325 MG tablet Take 1-2 tablets by mouth every 4 (four) hours as needed for moderate pain. (Patient not taking: Reported on 02/15/2017) 120 tablet 0  . ipratropium (ATROVENT) 0.03 % nasal spray instill 2 sprays into each nostril four times a day for congestion (Patient not taking: Reported on 02/15/2017) 30 mL 1  . lisinopril (PRINIVIL,ZESTRIL) 40 MG tablet take 1 tablet by mouth once daily (Patient not taking: Reported on 02/15/2017) 90 tablet 1  . loratadine (CLARITIN) 10 MG tablet Take 1 tablet (10 mg total) by mouth at bedtime. (Patient not taking: Reported on 02/15/2017) 90 tablet 1  . montelukast (SINGULAIR) 10 MG tablet Take 1 tablet (10 mg total) by mouth at bedtime. (Patient not taking: Reported on 02/15/2017) 30 tablet 3  . omeprazole (PRILOSEC) 20 MG capsule Take 1 capsule (20 mg total) by mouth 2 (two) times daily before a meal. As needed for heartburn (Patient not taking: Reported on 02/15/2017) 60 capsule 3   No current facility-administered medications on file prior to visit.    No Known Allergies Family History  Problem Relation Age of Onset  . Coronary artery disease Mother 76  . Cancer Father 60  . Heart disease Father   . Colon cancer Unknown   . Diabetes Unknown   . Coronary artery disease Sister   . Diabetes type II Sister   . Cancer Sister   . Coronary artery disease Brother   . Kidney disease Brother   . Coronary artery disease Brother    Social History   Socioeconomic History  . Marital status: Single    Spouse name: Not on file  . Number of children: 1  . Years of education: 45  . Highest education level: High school graduate  Social Needs  . Financial resource strain:  Not very hard  . Food insecurity - worry: Never true  . Food insecurity - inability: Never true  . Transportation needs - medical: No  . Transportation needs - non-medical: No  Occupational History  . Not on file  Tobacco Use  . Smoking status: Never Smoker  . Smokeless tobacco: Never Used  Substance and Sexual Activity  . Alcohol use: No  . Drug use: No  . Sexual activity: Not Currently  Other Topics Concern  . Not on file  Social History Narrative  . Not on file  [facility] 12/30/2016 12/25/2016 09/16/2016 07/06/2016 03/30/2016  Decreased Interest 2 0 0 0 0  Down, Depressed, Hopeless 1 1 0 0 0  PHQ - 2 Score 3 1 0 0 0  Altered sleeping 2 - - - -  Tired, decreased energy 3 - - - -  Change in appetite 0 - - - -  Feeling bad or failure about yourself  1 - - - -  Trouble concentrating 0 - - - -  Moving slowly or fidgety/restless 1 - - - -  Suicidal thoughts 1 - - - -  PHQ-9 Score 11 - - - -  Difficult doing work/chores Not difficult at all - - - -     Review of Systems  Constitutional: Positive for fatigue. Negative for activity change, appetite change, fever and unexpected weight change.  Genitourinary: Negative for difficulty urinating, dysuria and hematuria.  Musculoskeletal: Positive for arthralgias, back pain, gait problem and myalgias.  Skin: Negative for rash.  Neurological: Positive for dizziness, tremors and weakness.  Psychiatric/Behavioral: Positive for dysphoric mood and sleep disturbance. The patient is nervous/anxious.        Objective:   Physical Exam  Constitutional: She is oriented to person, place, and time. She appears well-developed and well-nourished. No distress.  HENT:  Head: Normocephalic and atraumatic.  Right Ear: External ear normal.  Left Ear: External ear normal.  Eyes: Conjunctivae are normal. No scleral icterus.  Neck: Normal range of motion. Neck supple. No thyromegaly present.  Cardiovascular: Normal rate, regular  rhythm, normal heart sounds and intact distal pulses.  Pulmonary/Chest: Effort normal and breath sounds normal. No respiratory distress.  Genitourinary: Rectal exam shows external hemorrhoid. Rectal exam shows no internal hemorrhoid, no fissure, no mass, no tenderness and anal tone normal.  Genitourinary Comments: Large hemorrhoidal skin tag at 6 oclock (side nearest vagina)  Musculoskeletal: She exhibits no edema.  Lymphadenopathy:    She has no cervical adenopathy.  Neurological: She is alert and oriented to person, place, and time.  Skin: Skin is warm and dry. She is not diaphoretic. No erythema.  Psychiatric: She has a normal mood and affect. Her behavior is normal.      BP (!) 154/87   Pulse 100   Temp 98.3 F (36.8 C) (Oral)   Resp 18   Ht 5' 2.6" (1.59 m)   Wt 206 lb (93.4 kg)   SpO2 97%   BMI 36.96 kg/m      Results for orders placed or performed in visit on 02/15/17  POCT urinalysis dipstick  Result Value Ref Range   Color, UA yellow yellow   Clarity, UA clear clear   Glucose, UA negative negative mg/dL   Bilirubin, UA negative negative   Ketones, POC UA trace (5) (A) negative mg/dL   Spec Grav, UA [id number] - 1.025   Blood, UA moderate (A) negative   pH, UA 5.5 5.0 - 8.0   Protein Ur, POC =100 (A) negative mg/dL   Urobilinogen, UA 0.2 0.2 or 1.0 E.U./dL   Nitrite, UA Negative Negative   Leukocytes, UA Negative Negative  POCT Microscopic Urinalysis ([facility])  Result Value Ref Range   WBC,UR,HPF,POC None None WBC/hpf   RBC,UR,HPF,POC Few (A) None RBC/hpf   Bacteria None None, Too numerous to count   Mucus Absent Absent   Epithelial Cells, UR Per Microscopy Few (A) None, Too numerous to count cells/hpf  POCT glucose (manual entry)  Result Value Ref Range   POC Glucose 125 (A)  70 - 99 mg/dl    Assessment & Plan:  Ok to send one refill of the pain medicine into the pharmacy around 2/28 if requested.  1. Microscopic hematuria - long-standeing and stable  since 2011. Watchful waiting. Refer to uirology only if worsening or other sxs develop.  2. Type 2 diabetes mellitus with microalbuminuria, without long-term current use of insulin (HCC)   3. Insomnia due to other mental disorder - stop using alprazolam for sleep - need to safe that med for anxiety/tremor since she is on a restricted supply due to narcotic use - nortriptyline 10 ineffecitve but not side effects - start weaning up on that to see if will help sleep.  4. Anxiety state - Has been on xanax for decades which she uses for a variety of things - anxiety, panic, sleep, tremor - she insists she needs and refuses to agree to try to wean off due to hydrocodone use - Will decrease dose of alprazolam to 0.25 from 0.5 but same sig and # of pills - I am hoping pt doesn't notice change as I did not inform her of it.   5. Primary osteoarthritis of both knees - pts hydrocodone use has increased substantially over the past year - from 1 tab qd now up to 3-4 tabs qd. AGAIN discussed w/ pt why it is contraindicated to be on both hydrocodone and xanax, esp considering her escalating dosage and daily use, but pt again insists that she needs both meds - that w/o the hydrocodone she wouldn't be able to get out of bed all day. Ok to send one refill of the pain medicine into the pharmacy around 2/28 if requested.   Bring ALL meds to f/u OV.   Orders Placed This Encounter  Procedures  . POCT urinalysis dipstick  . POCT Microscopic Urinalysis ([facility])  . POCT glucose (manual entry)    Meds ordered this encounter  Medications  . nortriptyline (PAMELOR) 25 MG capsule    Sig: Take 2 capsules (50 mg total) by mouth at bedtime.    Dispense:  60 capsule    Refill:  0  . lisinopril (PRINIVIL,ZESTRIL) 40 MG tablet    Sig: Take 1 tablet (40 mg total) by mouth daily.    Dispense:  90 tablet    Refill:  1  . ALPRAZolam (XANAX) 0.25 MG tablet    Sig: Take 1 tablet (0.25 mg total) by mouth 2 (two) times daily as  needed for anxiety.    Dispense:  60 tablet    Refill:  0  . HYDROcodone-acetaminophen (NORCO/VICODIN) 5-325 MG tablet    Sig: Take 1 tablet by mouth every 6 (six) hours as needed for moderate pain. For chronic pain    Dispense:  120 tablet    Refill:  0    [name], M.D.  Primary Care at [facility] [address] [place], [place] [address] [phone] phone [phone] fax  02/18/17 1:52 AM

## 2017-03-29 ENCOUNTER — Telehealth: Payer: Self-pay | Admitting: Family Medicine

## 2017-03-29 ENCOUNTER — Ambulatory Visit (INDEPENDENT_AMBULATORY_CARE_PROVIDER_SITE_OTHER): Payer: Medicare Other | Admitting: Family Medicine

## 2017-03-29 ENCOUNTER — Other Ambulatory Visit: Payer: Self-pay

## 2017-03-29 ENCOUNTER — Encounter: Payer: Self-pay | Admitting: Family Medicine

## 2017-03-29 VITALS — BP 130/76 | HR 86 | Temp 97.8°F | Resp 18 | Ht 62.6 in | Wt 211.2 lb

## 2017-03-29 DIAGNOSIS — G894 Chronic pain syndrome: Secondary | ICD-10-CM | POA: Diagnosis not present

## 2017-03-29 DIAGNOSIS — R809 Proteinuria, unspecified: Secondary | ICD-10-CM | POA: Diagnosis not present

## 2017-03-29 DIAGNOSIS — F19239 Other psychoactive substance dependence with withdrawal, unspecified: Secondary | ICD-10-CM | POA: Diagnosis not present

## 2017-03-29 DIAGNOSIS — E7800 Pure hypercholesterolemia, unspecified: Secondary | ICD-10-CM

## 2017-03-29 DIAGNOSIS — K219 Gastro-esophageal reflux disease without esophagitis: Secondary | ICD-10-CM

## 2017-03-29 DIAGNOSIS — G251 Drug-induced tremor: Secondary | ICD-10-CM

## 2017-03-29 DIAGNOSIS — E1129 Type 2 diabetes mellitus with other diabetic kidney complication: Secondary | ICD-10-CM | POA: Diagnosis not present

## 2017-03-29 DIAGNOSIS — M170 Bilateral primary osteoarthritis of knee: Secondary | ICD-10-CM

## 2017-03-29 DIAGNOSIS — Z5181 Encounter for therapeutic drug level monitoring: Secondary | ICD-10-CM

## 2017-03-29 DIAGNOSIS — R5382 Chronic fatigue, unspecified: Secondary | ICD-10-CM

## 2017-03-29 DIAGNOSIS — F411 Generalized anxiety disorder: Secondary | ICD-10-CM

## 2017-03-29 DIAGNOSIS — J3089 Other allergic rhinitis: Secondary | ICD-10-CM

## 2017-03-29 DIAGNOSIS — I10 Essential (primary) hypertension: Secondary | ICD-10-CM

## 2017-03-29 LAB — GLUCOSE, POCT (MANUAL RESULT ENTRY): POC Glucose: 117 mg/dl — AB (ref 70–99)

## 2017-03-29 LAB — POCT GLYCOSYLATED HEMOGLOBIN (HGB A1C): HEMOGLOBIN A1C: 7.3

## 2017-03-29 MED ORDER — NORTRIPTYLINE HCL 50 MG PO CAPS: 50.0000 mg | ORAL_CAPSULE | Freq: Every day | ORAL | 1 refills | Status: DC

## 2017-03-29 MED ORDER — HYDROCODONE-ACETAMINOPHEN 5-325 MG PO TABS: 1.0000 | ORAL_TABLET | Freq: Four times a day (QID) | ORAL | 0 refills | Status: DC | PRN

## 2017-03-29 MED ORDER — HYDROXYZINE PAMOATE 25 MG PO CAPS: 25.0000 mg | ORAL_CAPSULE | Freq: Three times a day (TID) | ORAL | 2 refills | Status: DC | PRN

## 2017-03-29 MED ORDER — OMEPRAZOLE 40 MG PO CPDR: 40.0000 mg | DELAYED_RELEASE_CAPSULE | Freq: Every day | ORAL | 3 refills | Status: DC

## 2017-03-29 MED ORDER — ATORVASTATIN CALCIUM 40 MG PO TABS: ORAL_TABLET | ORAL | 2 refills | Status: DC

## 2017-03-29 MED ORDER — PROPRANOLOL HCL ER 60 MG PO CP24: 60.0000 mg | ORAL_CAPSULE | Freq: Every day | ORAL | 1 refills | Status: DC

## 2017-03-29 MED ORDER — GABAPENTIN 600 MG PO TABS: 600.0000 mg | ORAL_TABLET | Freq: Three times a day (TID) | ORAL | 0 refills | Status: DC

## 2017-03-29 MED ORDER — AMLODIPINE BESYLATE 5 MG PO TABS: 5.0000 mg | ORAL_TABLET | Freq: Every day | ORAL | 1 refills | Status: DC

## 2017-03-29 MED ORDER — METFORMIN HCL 500 MG PO TABS: 500.0000 mg | ORAL_TABLET | Freq: Two times a day (BID) | ORAL | 1 refills | Status: DC

## 2017-03-29 MED ORDER — MECLIZINE HCL 25 MG PO TABS: 25.0000 mg | ORAL_TABLET | Freq: Three times a day (TID) | ORAL | 1 refills | Status: DC

## 2017-03-29 NOTE — Telephone Encounter (Signed)
Pharmacy is calling to address the directions written for lipitor /  Disp Refills Start End   atorvastatin (LIPITOR) 40 MG tablet 30 tablet 2 03/29/2017    Sig: Take 1 tab by mouth twice a week   Sent to pharmacy as: atorvastatin (LIPITOR) 40 MG tablet   E-Prescribing Status: Receipt confirmed by pharmacy (03/29/2017 11:43 AM EDT)    / Please call pharmacy back to clarify /

## 2017-03-29 NOTE — Patient Instructions (Addendum)
Take the propranolol every day to treat your tremor. Hopefully it will help a little with anxiety and blood pressure as well. If it makes you dizzy at all, start taking it at night instead.  Take the hydroxyzine as needed for anxiety - this is also an allergy pill so will hopefully help with the congestion. You may take an extra zyrtec, claritin, or allegra daily as well during this allergy season.  Start the omeprazole every night before bed for the next few months to treat the heart burn.  We will have you stop this after 4-6 weeks when hopefully the heartburn has fully resolved.  Restart the cholesterol pill atorvastatin but just take it about twice a week to help keep your heart safe without making your leg/knee pain worse.  Since you don't think the gabapentin is helping at all, lets try a higher dose as this could help with pain, anxiety, nerves, and tremor - you can take 2 tabs three times a day of what you currently have.       IF you received an x-ray today, you will receive an invoice [facility]. Please contact [facility] at [phone] with questions or concerns regarding your invoice.   IF you received labwork today, you will receive an invoice from [facility]. Please contact [facility] at [phone] with questions or concerns regarding your invoice.   Our billing staff will not be able to assist you with questions regarding bills from these companies.  You will be contacted with the lab results as soon as they are available. The fastest way to get your results is to activate your My Chart account. Instructions are located on the last page of this paperwork. If you have not heard from us regarding the results in 2 weeks, please contact this office.

## 2017-03-29 NOTE — Progress Notes (Signed)
Subjective:    Patient ID: Casey [name], female    DOB: Nov 07, 1935, 82 y.o.   MRN: [id number] Chief Complaint  Patient presents with  . Diabetes    Pt states she doesn't check her sugars because she broke her glucometer.  Marland Kitchen Anxiety    Pt states anxiety has been bad and she shakes all the time. GAD-7 score 10  . 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  She is sleeping better on the higher dose of nortriptyline Anxiety state - Has been on xanax for decades which she uses for a variety of things - anxiety, panic, sleep, tremor - she insists she needs and refuses to agree to try to wean off due to hydrocodone use - Will decrease dose of alprazolam to 0.25 from 0.5 but same sig and # of pills - I am hoping pt doesn't notice change as I did not inform her of it. She filled her alpraozlam 0.46m #60 on 02/25/16 - feels like they don't work and she is having a tremor all the time.  Has been all month but getting worse.  She is getting nervous all the time, even in church and would like to try a different medication for this.  She does worry a lot about things she thinks she shouldn't, doesn't know why and knows it isn't going to help.      Primary osteoarthritis of both knees - pts hydrocodone use has increased substantially over the past year - from 1 tab qd now up to 3-4 tabs qd. AGAIN discussed w/ pt why it is contraindicated to be on both hydrocodone and xanax, esp considering her escalating dosage and daily use, but pt again insists that she needs both meds - that w/o the hydrocodone she wouldn't be able to get out of bed all day.  Last filled #120 on 1/28/2019l and she still has 5-10 left.   She get her licensed renewed but hasn't been able to  start driving as she is to nervous. Doesn't feel like the gabapentin 300 tid but it doesn't   Bad acid reflux/GERD sxs.  DMII: Diagnosed .   Lab Results  Component Value Date   HGBA1C 8.5 (H) 12/25/2016   HGBA1C 6.8 07/06/2016   HGBA1C 6.9 03/30/2016   CBGs: fasting a.m. ; after meal  ; No hypoglycemic episodes.  Meter type:  Diet:  Exercising:  DM Med Regimen: Prior changes:   eGFR:  Baseline Cr:  Last checked . Microalb: Done 12/25/16. Normal. On acei - lisinopril 40 Lipids:  LDL 157,  non-HDL 176.  Last levels done 12/25/2016 - were improving from prior. Not on statin due to impairing myalgias. Taking asa 81 qd.  Optho: Seen annually by - last exam  Feet: Monofilament exam done . Denies any no problems.  Not seen by podiatry prior.  Immunizations:  Influenza:  Pneumovax-23:  Past Medical History:  Diagnosis Date  . ALLERGIC RHINITIS   . Allergy   . ANXIETY   . ARM PAIN, RIGHT   . BENIGN POSITIONAL VERTIGO   . Carcinoma in situ of colon   . CONSTIPATION   . CYSTOCELE WITH INCOMPLETE UTERINE PROLAPSE   . ESOPHAGEAL STRICTURE   .  FATIGUE   . FLANK PAIN, LEFT   . Headache(784.0)   . HIP PAIN, LEFT   . HYPERCHOLESTEROLEMIA   . HYPERGLYCEMIA   . HYPERTENSION, BENIGN ESSENTIAL 03/03/2007  . LEG EDEMA, BILATERAL   . Microscopic hematuria   . OBESITY   . OSTEOARTHRITIS, KNEE   . OSTEOPOROSIS   . Peripheral neuropathy, idiopathic   . Problems with swallowing and mastication   . SINUSITIS, RECURRENT   . SPRAIN&STRAIN OTH SPEC SITES SHOULDER&UPPER ARM    Past Surgical History:  Procedure Laterality Date  . LEFT HEART CATHETERIZATION WITH CORONARY ANGIOGRAM N/A 12/02/2010   Procedure: LEFT HEART CATHETERIZATION WITH CORONARY ANGIOGRAM;  Surgeon: Peter M Martinique, MD;  Location: [facility] CATH LAB;  Service: Cardiovascular;  Laterality: N/A;   Current Outpatient Medications on File Prior to Visit  Medication Sig Dispense Refill  . ALPRAZolam (XANAX) 0.25 MG tablet Take 1  tablet (0.25 mg total) by mouth 2 (two) times daily as needed for anxiety. 60 tablet 0  . amLODipine (NORVASC) 5 MG tablet Take 1 tablet (5 mg total) by mouth daily. 90 tablet 0  . aspirin EC 81 MG tablet Take 1 tablet (81 mg total) by mouth daily.    . Cholecalciferol (VITAMIN D3) 5000 units CAPS Take 1 capsule by mouth daily.    Marland Kitchen HYDROcodone-acetaminophen (NORCO/VICODIN) 5-325 MG tablet Take 1 tablet by mouth every 6 (six) hours as needed for moderate pain. For chronic pain 120 tablet 0  . lisinopril (PRINIVIL,ZESTRIL) 40 MG tablet Take 1 tablet (40 mg total) by mouth daily. 90 tablet 1  . meclizine (ANTIVERT) 25 MG tablet Take 1 tablet (25 mg total) by mouth 3 (three) times daily. 270 tablet 1  . metFORMIN (GLUCOPHAGE) 500 MG tablet Take 1 tablet (500 mg total) by mouth 2 (two) times daily with a meal. 180 tablet 0  . nortriptyline (PAMELOR) 25 MG capsule Take 2 capsules (50 mg total) by mouth at bedtime. 60 capsule 0  . tobramycin-dexamethasone (TOBRADEX) ophthalmic solution instill 1 drop into left eye four times a day if needed  0  . triamcinolone (NASACORT) 55 MCG/ACT AERO nasal inhaler Place 2 sprays into the nose daily. 1 Inhaler 2  . glucose blood test strip 1 each by Other route daily. Use as instructed (Patient not taking: Reported on 03/29/2017) 50 each 11   No current facility-administered medications on file prior to visit.    No Known Allergies Family History  Problem Relation Age of Onset  . Coronary artery disease Mother 73  . Cancer Father 23  . Heart disease Father   . Colon cancer Unknown   . Diabetes Unknown   . Coronary artery disease Sister   . Diabetes type II Sister   . Cancer Sister   . Coronary artery disease Brother   . Kidney disease Brother   . Coronary artery disease Brother    Social History   Socioeconomic History  . Marital status: Single    Spouse name: None  . Number of children: 1  . Years of education: 55  . Highest education level: High  school graduate  Social Needs  . Financial resource strain: Not very hard  . Food insecurity - worry: Never true  . Food insecurity - inability: Never true  . Transportation needs - medical: No  . Transportation needs - non-medical: No  Occupational History  . None  Tobacco Use  . Smoking status: Never Smoker  . Smokeless tobacco: Never Used  Substance and Sexual Activity  .  Alcohol use: No  . Drug use: No  . Sexual activity: Not Currently  Other Topics Concern  . None  Social History Narrative  . None   Depression screen [facility] 2/9 03/29/2017 12/30/2016 12/25/2016 09/16/2016 07/06/2016  Decreased Interest 0 2 0 0 0  Down, Depressed, Hopeless 0 1 1 0 0  PHQ - 2 Score 0 3 1 0 0  Altered sleeping - 2 - - -  Tired, decreased energy - 3 - - -  Change in appetite - 0 - - -  Feeling bad or failure about yourself  - 1 - - -  Trouble concentrating - 0 - - -  Moving slowly or fidgety/restless - 1 - - -  Suicidal thoughts - 1 - - -  PHQ-9 Score - 11 - - -  Difficult doing work/chores - Not difficult at all - - -    Review of Systems  Constitutional: Positive for activity change, appetite change and fatigue. Negative for chills and fever.  Eyes: Negative for visual disturbance.  Respiratory: Negative for cough and shortness of breath.   Cardiovascular: Negative for chest pain, palpitations and leg swelling.  Genitourinary: Negative for decreased urine volume.  Musculoskeletal: Positive for arthralgias and gait problem.  Neurological: Positive for light-headedness. Negative for dizziness, syncope and headaches.  Hematological: Does not bruise/bleed easily.  Psychiatric/Behavioral: Positive for agitation, behavioral problems, decreased concentration, dysphoric mood and sleep disturbance. Negative for confusion, hallucinations, self-injury and suicidal ideas. The patient is nervous/anxious. The patient is not hyperactive.        Objective:   Physical Exam  Constitutional: She is  oriented to person, place, and time. She appears well-developed and well-nourished. No distress.  HENT:  Head: Normocephalic and atraumatic.  Right Ear: External ear normal.  Left Ear: External ear normal.  Eyes: Conjunctivae are normal. No scleral icterus.  Neck: Normal range of motion. Neck supple. No thyromegaly present.  Cardiovascular: Normal rate, regular rhythm, normal heart sounds and intact distal pulses.  Pulmonary/Chest: Effort normal and breath sounds normal. No respiratory distress.  Musculoskeletal: She exhibits no edema.  Lymphadenopathy:    She has no cervical adenopathy.  Neurological: She is alert and oriented to person, place, and time.  Skin: Skin is warm and dry. She is not diaphoretic. No erythema.  Psychiatric: She has a normal mood and affect. Her behavior is normal.      BP 130/76 (BP Location: Left Arm, Patient Position: Sitting, Cuff Size: Large)   Pulse 86   Temp 97.8 F (36.6 C) (Oral)   Resp 18   Ht 5' 2.6" (1.59 m)   Wt 211 lb 3.2 oz (95.8 kg)   SpO2 99%   BMI 37.89 kg/m      Assessment & Plan:    1. Type 2 diabetes mellitus with microalbuminuria, without long-term current use of insulin ([place])   2. Chronic pain syndrome - pt does not perceive any benefit from gabapentin at currently low dose so increase  - hopefully will help with anxiety, tremor, as well as nerve and msk pain.  adament that she needs hydrocodone to help her bear getting around. Understands that she is at incredibly high risk of unintentional OD since has is also dependent upon xanax (just failed our attempt at weaning down/off that).  Will send in 2 rx for hydrocodone 47m #120 - one to fill today 3/11 and the other to fill on or after 4/11 - based upon past and reported use - this should be  sufficient to get her to her f/u OV in 3 mos at least before in need for additional refills.  3. Medication monitoring encounter   4. Primary osteoarthritis of both knees   5. HYPERTENSION,  BENIGN ESSENTIAL - start propranolol to help with HTN, anxiety, and tremor  6. Anxiety state - trying to wean down/off alprazolam since she is insistent on her need for narcotics for pain control - try hydroxyzine for anxiety instead  7. Chronic fatigue   8. Tremor due to drug withdrawal ([place])   9. Non-seasonal allergic rhinitis, unspecified trigger - ok to use 24 non-drowsy antihistmine when needed - start soon - even if using prn hydroxyzine.  10. HYPERCHOLESTEROLEMIA - start atorvastatin just once or twice a week to hopefully avoid lower ext pain like she had w/ prior statin  11.    GERD - do ppi qhs for sev mos - then stop. If sxs persist, refer to GI but high risk for very atypical anginal sxs as a female DM poor historian and certainly has numerous risk factors for ACS.  Trial of increasing gabapentin for tremor and pain at f/u.  Orders Placed This Encounter  Procedures  . Comprehensive metabolic panel  . ToxASSURE Select 13 (MW), Urine  . POCT glucose (manual entry)  . POCT glycosylated hemoglobin (Hb A1C)    Meds ordered this encounter  Medications  . amLODipine (NORVASC) 5 MG tablet    Sig: Take 1 tablet (5 mg total) by mouth daily.    Dispense:  90 tablet    Refill:  1  . nortriptyline (PAMELOR) 50 MG capsule    Sig: Take 1 capsule (50 mg total) by mouth at bedtime.    Dispense:  90 capsule    Refill:  1  . meclizine (ANTIVERT) 25 MG tablet    Sig: Take 1 tablet (25 mg total) by mouth 3 (three) times daily.    Dispense:  270 tablet    Refill:  1  . metFORMIN (GLUCOPHAGE) 500 MG tablet    Sig: Take 1 tablet (500 mg total) by mouth 2 (two) times daily with a meal.    Dispense:  180 tablet    Refill:  1  . propranolol ER (INDERAL LA) 60 MG 24 hr capsule    Sig: Take 1 capsule (60 mg total) by mouth daily.    Dispense:  90 capsule    Refill:  1  . DISCONTD: HYDROcodone-acetaminophen (NORCO/VICODIN) 5-325 MG tablet    Sig: Take 1 tablet by mouth every 6 (six) hours as  needed for moderate pain. For chronic pain    Dispense:  120 tablet    Refill:  0  . HYDROcodone-acetaminophen (NORCO/VICODIN) 5-325 MG tablet    Sig: Take 1 tablet by mouth every 6 (six) hours as needed for moderate pain. For chronic pain    Dispense:  120 tablet    Refill:  0  . hydrOXYzine (VISTARIL) 25 MG capsule    Sig: Take 1 capsule (25 mg total) by mouth 3 (three) times daily as needed for anxiety.    Dispense:  30 capsule    Refill:  2  . omeprazole (PRILOSEC) 40 MG capsule    Sig: Take 1 capsule (40 mg total) by mouth daily.    Dispense:  30 capsule    Refill:  3  . atorvastatin (LIPITOR) 40 MG tablet    Sig: Take 1 tab by mouth twice a week    Dispense:  30 tablet  Refill:  2     Delman Cheadle, M.D.  Primary Care at [facility] [address] [place], [place] [address] [phone] phone [phone] fax  03/29/17 11:45 AM

## 2017-03-29 NOTE — Telephone Encounter (Signed)
Copied from CRM [id number]. Topic: Quick Communication - See Telephone Encounter >> Mar 29, 2017 12:02 PM Guinevere FerrariMorris, Naiah Donahoe E, NT wrote: CRM for notification. See Telephone encounter for: Leann called and wanted to verify directions for atorvastatin (LIPITOR) 40 MG tablet 03/29/17.

## 2017-03-29 NOTE — Telephone Encounter (Signed)
Phone call to [facility] on [address]. Prescription clarified with pharmacy per 03/29/17 office visit. Closing note.

## 2017-03-30 ENCOUNTER — Encounter: Payer: Self-pay | Admitting: Radiology

## 2017-03-30 LAB — COMPREHENSIVE METABOLIC PANEL
A/G RATIO: 1.6 (ref 1.2–2.2)
ALBUMIN: 4.2 g/dL (ref 3.5–4.7)
ALT: 14 IU/L (ref 0–32)
AST: 16 IU/L (ref 0–40)
Alkaline Phosphatase: 92 IU/L (ref 39–117)
BUN / CREAT RATIO: 20 (ref 12–28)
BUN: 18 mg/dL (ref 8–27)
Bilirubin Total: 0.2 mg/dL (ref 0.0–1.2)
CALCIUM: 9.3 mg/dL (ref 8.7–10.3)
CO2: 24 mmol/L (ref 20–29)
Chloride: 103 mmol/L (ref 96–106)
Creatinine, Ser: 0.88 mg/dL (ref 0.57–1.00)
GFR, EST AFRICAN AMERICAN: 72 mL/min/{1.73_m2} (ref >59)
GFR, EST NON AFRICAN AMERICAN: 62 mL/min/{1.73_m2} (ref >59)
Globulin, Total: 2.7 g/dL (ref 1.5–4.5)
Glucose: 116 mg/dL — ABNORMAL HIGH (ref 65–99)
POTASSIUM: 4.5 mmol/L (ref 3.5–5.2)
Sodium: 142 mmol/L (ref 134–144)
TOTAL PROTEIN: 6.9 g/dL (ref 6.0–8.5)

## 2017-04-01 LAB — TOXASSURE SELECT 13 (MW), URINE

## 2017-04-09 ENCOUNTER — Telehealth: Payer: Self-pay | Admitting: Family Medicine

## 2017-04-09 NOTE — Telephone Encounter (Signed)
Copied from CRM [id number]. Topic: Quick Communication - See Telephone Encounter >> Apr 09, 2017 12:57 PM Arlyss Gandyichardson, Donterius Filley N, NT wrote: CRM for notification. See Telephone encounter for: 04/09/17. Pt calling and requesting to start taking Xanax again for her nerves. She states it helped her better than the medicine she is on now does. [facility] on [address]

## 2017-04-12 MED ORDER — ALPRAZOLAM 0.25 MG PO TABS: 0.2500 mg | ORAL_TABLET | Freq: Every evening | ORAL | 0 refills | Status: DC | PRN

## 2017-04-12 NOTE — Telephone Encounter (Signed)
Hydroxyzine didn't cut it I guess - sent in alprazolam to take 1-2 tabs by mouth at bed. DO NOT TAKE WITHIN 3-4 HOURS OF TAKING A HYDROCODONE PAIN PILL. Bring all meds with her again to next f/u OV.

## 2017-04-12 NOTE — Addendum Note (Signed)
Addended by: Sherren MochaSHAW, Shaiann Mcmanamon N on: 04/12/2017 08:38 PM   Modules accepted: Orders

## 2017-04-13 NOTE — Telephone Encounter (Signed)
Pt advised.

## 2017-05-17 ENCOUNTER — Telehealth: Payer: Self-pay | Admitting: Family Medicine

## 2017-05-17 DIAGNOSIS — M170 Bilateral primary osteoarthritis of knee: Secondary | ICD-10-CM

## 2017-05-17 NOTE — Telephone Encounter (Signed)
Request for refill of Norco 5-325mg . Last filled on 03/29/17 #120  LOV: 03/29/17 PCP: Dr. Clelia Croft  [facility]   [address]

## 2017-05-17 NOTE — Telephone Encounter (Signed)
Copied from CRM [id number]. Topic: Quick Communication - Rx Refill/Question >> May 17, 2017  1:28 PM Raquel Sarna wrote: HYDROcodone-acetaminophen (NORCO/VICODIN) 5-325 MG tablet  Pt needing refill  [facility] [phone] - [place], [place] - [address] AT [address] & [address] [place] [address] Phone: [phone] Fax: [phone]

## 2017-05-18 NOTE — Telephone Encounter (Signed)
Patient is requesting a refill of the following medications: Requested Prescriptions   Pending Prescriptions Disp Refills  . HYDROcodone-acetaminophen (NORCO/VICODIN) 5-325 MG tablet 120 tablet 0    Sig: Take 1 tablet by mouth every 6 (six) hours as needed for moderate pain. For chronic pain    Date of patient request: 05/17/17 Last office visit: 03/29/2017 Date of last refill: 03/29/2017 Last refill amount: 120 tablets, no refills       Follow up time period per chart: Return in about 3 months (around 06/29/2017) for recheck new meds and DM.

## 2017-05-20 NOTE — Telephone Encounter (Signed)
Spoke with Leann at [facility] - she states any rx sent to them before 04/02/17 cannot be retrieved because they switched from [facility] over [place] then . State it will have to be resent.

## 2017-05-20 NOTE — Telephone Encounter (Signed)
Please call [facility] [address] and check - they should still have a script for hydrocodone  #120 on file that was post-dated to start/fill on or after 04/29/17. Per the [place] CSRS, that rx has not been filled yet - 2 rxs were sent over on 3/11 at pt's last visit - one for 3/11 and one for 4/11 - and just one has been filled - on 3/11.  These were both supposed to last her the 3-4 mos until her next f/u OV.

## 2017-05-25 NOTE — Telephone Encounter (Signed)
PT called to check on status of refill 

## 2017-05-26 MED ORDER — HYDROCODONE-ACETAMINOPHEN 5-325 MG PO TABS: 1.0000 | ORAL_TABLET | Freq: Four times a day (QID) | ORAL | 0 refills | Status: DC | PRN

## 2017-05-26 NOTE — Telephone Encounter (Signed)
Attempted to call pt. Phone keeps ringing with no machine.

## 2017-05-26 NOTE — Telephone Encounter (Signed)
PA complete. Approved. Case# [id number] Feb 25 2017-May 26 2018

## 2017-05-26 NOTE — Telephone Encounter (Signed)
Pt calling in stating she called [facility] and they have received the script but they cant fill it with out a PA due to insurance not wanting to cover the medication. She states she is in a lot of pain with her arthritis right now.

## 2017-05-26 NOTE — Telephone Encounter (Signed)
Script sent in. Please alert pt and apologize on the delay - misunderstanding due to pharmacy loosing prior rxs when they changed companies. Thanks.

## 2017-05-26 NOTE — Addendum Note (Signed)
Addended by: Sherren Mocha on: 05/26/2017 11:18 AM   Modules accepted: Orders

## 2017-05-26 NOTE — Telephone Encounter (Signed)
Pt advised.

## 2017-06-02 ENCOUNTER — Telehealth: Payer: Self-pay

## 2017-06-02 NOTE — Telephone Encounter (Signed)
Spoke with pharmacist - PA went through and he will get rx filled for pt

## 2017-06-02 NOTE — Telephone Encounter (Signed)
Copied from CRM [id number]. Topic: Quick Communication - Rx Refill/Question >> May 17, 2017  1:28 PM Raquel Sarna wrote: HYDROcodone-acetaminophen (NORCO/VICODIN) 5-325 MG tablet  Pt needing refill  [facility] [phone] - [place], [place] - [address] AT [facility] [address] & [address] [place] [address] Phone: [phone] Fax: [phone]   >> Jun 01, 2017 10:52 AM Clack, Princella Pellegrini wrote: Pt states she spoke with [facility] today and they stated they do not have the Rx for her HYDROcodone-acetaminophen (NORCO/VICODIN) 5-325 MG tablet [[id number]].   Please resend and f/u with pt.

## 2017-06-16 ENCOUNTER — Encounter: Payer: Self-pay | Admitting: Family Medicine

## 2017-07-01 ENCOUNTER — Ambulatory Visit (INDEPENDENT_AMBULATORY_CARE_PROVIDER_SITE_OTHER): Payer: Medicare Other | Admitting: Family Medicine

## 2017-07-01 ENCOUNTER — Encounter: Payer: Self-pay | Admitting: Family Medicine

## 2017-07-01 ENCOUNTER — Other Ambulatory Visit: Payer: Self-pay

## 2017-07-01 VITALS — BP 130/64 | HR 66 | Temp 97.9°F | Resp 16 | Ht 62.25 in | Wt 204.0 lb

## 2017-07-01 DIAGNOSIS — F411 Generalized anxiety disorder: Secondary | ICD-10-CM

## 2017-07-01 DIAGNOSIS — J3089 Other allergic rhinitis: Secondary | ICD-10-CM

## 2017-07-01 DIAGNOSIS — Z5181 Encounter for therapeutic drug level monitoring: Secondary | ICD-10-CM

## 2017-07-01 DIAGNOSIS — G894 Chronic pain syndrome: Secondary | ICD-10-CM

## 2017-07-01 DIAGNOSIS — IMO0002 Reserved for concepts with insufficient information to code with codable children: Secondary | ICD-10-CM

## 2017-07-01 DIAGNOSIS — Z79899 Other long term (current) drug therapy: Secondary | ICD-10-CM

## 2017-07-01 DIAGNOSIS — I10 Essential (primary) hypertension: Secondary | ICD-10-CM | POA: Diagnosis not present

## 2017-07-01 DIAGNOSIS — D649 Anemia, unspecified: Secondary | ICD-10-CM

## 2017-07-01 DIAGNOSIS — J329 Chronic sinusitis, unspecified: Secondary | ICD-10-CM

## 2017-07-01 DIAGNOSIS — R3129 Other microscopic hematuria: Secondary | ICD-10-CM

## 2017-07-01 DIAGNOSIS — R809 Proteinuria, unspecified: Secondary | ICD-10-CM | POA: Diagnosis not present

## 2017-07-01 DIAGNOSIS — E1129 Type 2 diabetes mellitus with other diabetic kidney complication: Secondary | ICD-10-CM

## 2017-07-01 DIAGNOSIS — E871 Hypo-osmolality and hyponatremia: Secondary | ICD-10-CM

## 2017-07-01 DIAGNOSIS — M1710 Unilateral primary osteoarthritis, unspecified knee: Secondary | ICD-10-CM

## 2017-07-01 DIAGNOSIS — Z6837 Body mass index (BMI) 37.0-37.9, adult: Secondary | ICD-10-CM

## 2017-07-01 DIAGNOSIS — M810 Age-related osteoporosis without current pathological fracture: Secondary | ICD-10-CM | POA: Diagnosis not present

## 2017-07-01 DIAGNOSIS — E7800 Pure hypercholesterolemia, unspecified: Secondary | ICD-10-CM

## 2017-07-01 DIAGNOSIS — M170 Bilateral primary osteoarthritis of knee: Secondary | ICD-10-CM

## 2017-07-01 LAB — POCT GLYCOSYLATED HEMOGLOBIN (HGB A1C): Hemoglobin A1C: 6.7 % — AB (ref 4.0–5.6)

## 2017-07-01 LAB — GLUCOSE, POCT (MANUAL RESULT ENTRY): POC Glucose: 149 mg/dl — AB (ref 70–99)

## 2017-07-01 MED ORDER — LISINOPRIL 40 MG PO TABS: 40.0000 mg | ORAL_TABLET | Freq: Every day | ORAL | 0 refills | Status: DC

## 2017-07-01 MED ORDER — GABAPENTIN 600 MG PO TABS: 600.0000 mg | ORAL_TABLET | Freq: Three times a day (TID) | ORAL | 1 refills | Status: DC

## 2017-07-01 MED ORDER — AZELASTINE HCL 0.05 % OP SOLN: 1.0000 [drp] | Freq: Two times a day (BID) | OPHTHALMIC | 1 refills | Status: DC

## 2017-07-01 MED ORDER — AZELASTINE HCL 0.1 % NA SOLN: 1.0000 | Freq: Two times a day (BID) | NASAL | 1 refills | Status: DC

## 2017-07-01 MED ORDER — ALPRAZOLAM 0.25 MG PO TABS: 0.2500 mg | ORAL_TABLET | Freq: Every evening | ORAL | 0 refills | Status: DC | PRN

## 2017-07-01 MED ORDER — ATORVASTATIN CALCIUM 40 MG PO TABS
ORAL_TABLET | ORAL | 1 refills | Status: DC
Start: 2017-07-01 — End: 2018-01-03

## 2017-07-01 NOTE — Progress Notes (Signed)
Subjective:  By signing my name below, I, Casey [name], attest that this documentation has been prepared under the direction and in the presence of Casey Sorenson, MD. Electronically Signed: Stann [name], Scribe. 07/01/2017 , 8:56 AM .  Patient was seen in Room 1 .   Patient ID: Casey [name], female    DOB: 07/23/1935, 82 y.o.   MRN: [id number] Chief Complaint  Patient presents with  . Medication Refill   HPI Casey [name] is a 82 y.o. female who presents to Primary Care at [facility] for medication refill. Patient states she had bilateral upper and lower eye lids surgery done on May 20th by Dr. Dione Booze. However, her eye lids have become swollen. She was recommended to apply heat or ice over her eyes, but other people advised her to apply ice instead. So she's been applying ice over her eyes twice a day. She will be returning to see Dr. Dione Booze on Tuesday (6/18). She denies using any creams around her eyes or eye drops in her eyes. She also notes having seasonal allergies with the pollen. She mentions her anxiety has gotten better, and her pain has improved.   Medications Meclizine: still has another 3 months supply Gabapentin: out of it, should've ran out 2 days prior.  Hydrocodone: roughly 45 tablets left  Past Medical History:  Diagnosis Date  . ALLERGIC RHINITIS   . Allergy   . ANXIETY   . ARM PAIN, RIGHT   . BENIGN POSITIONAL VERTIGO   . Carcinoma in situ of colon   . CONSTIPATION   . CYSTOCELE WITH INCOMPLETE UTERINE PROLAPSE   . ESOPHAGEAL STRICTURE   . FATIGUE   . FLANK PAIN, LEFT   . Headache(784.0)   . HIP PAIN, LEFT   . HYPERCHOLESTEROLEMIA   . HYPERGLYCEMIA   . HYPERTENSION, BENIGN ESSENTIAL 03/03/2007  . LEG EDEMA, BILATERAL   . Microscopic hematuria   . OBESITY   . OSTEOARTHRITIS, KNEE   . OSTEOPOROSIS   . Peripheral neuropathy, idiopathic   . Problems with swallowing and mastication   . SINUSITIS, RECURRENT   . SPRAIN&STRAIN OTH SPEC SITES SHOULDER&UPPER ARM     Past Surgical History:  Procedure Laterality Date  . LEFT HEART CATHETERIZATION WITH CORONARY ANGIOGRAM N/A 12/02/2010   Procedure: LEFT HEART CATHETERIZATION WITH CORONARY ANGIOGRAM;  Surgeon: Peter M Swaziland, MD;  Location: [facility] CATH LAB;  Service: Cardiovascular;  Laterality: N/A;   Prior to Admission medications   Medication Sig Start Date End Date Taking? Authorizing Provider  ALPRAZolam (XANAX) 0.25 MG tablet Take 1-2 tablets (0.25-0.5 mg total) by mouth at bedtime as needed for anxiety or sleep. 04/12/17   Sherren Mocha, MD  amLODipine (NORVASC) 5 MG tablet Take 1 tablet (5 mg total) by mouth daily. 03/29/17   Sherren Mocha, MD  aspirin EC 81 MG tablet Take 1 tablet (81 mg total) by mouth daily. 08/21/15   Sherren Mocha, MD  atorvastatin (LIPITOR) 40 MG tablet Take 1 tab by mouth twice a week 03/29/17   Sherren Mocha, MD  Cholecalciferol (VITAMIN D3) 5000 units CAPS Take 1 capsule by mouth daily.    [provider]  gabapentin (NEURONTIN) 600 MG tablet Take 1 tablet (600 mg total) by mouth 3 (three) times daily. 03/29/17   Sherren Mocha, MD  glucose blood test strip 1 each by Other route daily. Use as instructed Patient not taking: Reported on 03/29/2017 09/08/16   Sherren Mocha, MD  HYDROcodone-acetaminophen (NORCO/VICODIN) [phone]  MG tablet Take 1 tablet by mouth every 6 (six) hours as needed for moderate pain. For chronic pain 05/26/17   Sherren MochaShaw, Eva N, MD  hydrOXYzine (VISTARIL) 25 MG capsule Take 1 capsule (25 mg total) by mouth 3 (three) times daily as needed for anxiety. 03/29/17   Sherren MochaShaw, Eva N, MD  lisinopril (PRINIVIL,ZESTRIL) 40 MG tablet Take 1 tablet (40 mg total) by mouth daily. 02/15/17   Sherren MochaShaw, Eva N, MD  meclizine (ANTIVERT) 25 MG tablet Take 1 tablet (25 mg total) by mouth 3 (three) times daily. 03/29/17   Sherren MochaShaw, Eva N, MD  metFORMIN (GLUCOPHAGE) 500 MG tablet Take 1 tablet (500 mg total) by mouth 2 (two) times daily with a meal. 03/29/17   Sherren MochaShaw, Eva N, MD  nortriptyline (PAMELOR) 50 MG capsule  Take 1 capsule (50 mg total) by mouth at bedtime. 03/29/17   Sherren MochaShaw, Eva N, MD  omeprazole (PRILOSEC) 40 MG capsule Take 1 capsule (40 mg total) by mouth daily. 03/29/17   Sherren MochaShaw, Eva N, MD  propranolol ER (INDERAL LA) 60 MG 24 hr capsule Take 1 capsule (60 mg total) by mouth daily. 03/29/17   Sherren MochaShaw, Eva N, MD  tobramycin-dexamethasone [facility](TOBRADEX) ophthalmic solution instill 1 drop into left eye four times a day if needed 01/29/17   [provider]  triamcinolone (NASACORT) 55 MCG/ACT AERO nasal inhaler Place 2 sprays into the nose daily. 09/16/16   Sherren MochaShaw, Eva N, MD   No Known Allergies Family History  Problem Relation Age of Onset  . Coronary artery disease Mother [id number]  . Cancer Father 3569  . Heart disease Father   . Colon cancer Unknown   . Diabetes Unknown   . Coronary artery disease Sister   . Diabetes type II Sister   . Cancer Sister   . Coronary artery disease Brother   . Kidney disease Brother   . Coronary artery disease Brother    Social History   Socioeconomic History  . Marital status: Single    Spouse name: Not on file  . Number of children: 1  . Years of education: 3412  . Highest education level: High school graduate  Occupational History  . Not on file  Social Needs  . Financial resource strain: Not very hard  . Food insecurity:    Worry: Never true    Inability: Never true  . Transportation needs:    Medical: No    Non-medical: No  Tobacco Use  . Smoking status: Never Smoker  . Smokeless tobacco: Never Used  Substance and Sexual Activity  . Alcohol use: No  . Drug use: No  . Sexual activity: Not Currently  Lifestyle  . Physical activity:    Days per week: 0 days    Minutes per session: 0 min  . Stress: Only a little  Relationships  . Social connections:    Talks on phone: Once a week    Gets together: Once a week    Attends religious service: More than 4 times per year    Active member of club or organization: No    Attends meetings of clubs or  organizations: Never    Relationship status: Never married  Other Topics Concern  . Not on file  Social History Narrative  . Not on file   Depression screen [facility] 2/9 07/01/2017 03/29/2017 12/30/2016 12/25/2016 09/16/2016  Decreased Interest 0 0 2 0 0  Down, Depressed, Hopeless 0 0 1 1 0  PHQ - 2 Score 0 0 3 1  0  Altered sleeping - - 2 - -  Tired, decreased energy - - 3 - -  Change in appetite - - 0 - -  Feeling bad or failure about yourself  - - 1 - -  Trouble concentrating - - 0 - -  Moving slowly or fidgety/restless - - 1 - -  Suicidal thoughts - - 1 - -  PHQ-9 Score - - 11 - -  Difficult doing work/chores - - Not difficult at all - -    Review of Systems     Objective:   Physical Exam  Constitutional: She is oriented to person, place, and time. She appears well-developed and well-nourished. No distress.  HENT:  Head: Normocephalic and atraumatic.  Eyes: Pupils are equal, round, and reactive to light. Conjunctivae and EOM are normal. Right eye exhibits no chemosis and no discharge. Left eye exhibits no chemosis and no discharge.  Conjunctivae are clear, no chemosis or obvious drainage; upper and lower lids bilaterally with significant edema, left lower with small ecchymoses  Neck: Neck supple.  Cardiovascular: Normal rate, regular rhythm and normal heart sounds.  No murmur heard. Pulmonary/Chest: Effort normal. No respiratory distress.  Musculoskeletal: Normal range of motion.  Neurological: She is alert and oriented to person, place, and time.  Skin: Skin is warm and dry.  Psychiatric: She has a normal mood and affect. Her behavior is normal.  Nursing note and vitals reviewed.   BP 130/64 (BP Location: Right Arm)   Pulse 66   Temp 97.9 F (36.6 C) (Oral)   Resp 16   Ht 5' 2.25" (1.581 m)   Wt 204 lb (92.5 kg)   SpO2 96%   BMI 37.01 kg/m      Assessment & Plan:    1. Type 2 diabetes mellitus with microalbuminuria, without long-term current use of insulin (HCC)     2. HYPERTENSION, BENIGN ESSENTIAL - cell contolled o  3. Age-related osteoporosis without current pathological fracture   4. Microscopic hematuria   5. HYPERCHOLESTEROLEMIA - statin daily was causing knee pain that got sig better when stopped statin - but since then, OA knee pain has sig worsened to now req chronic narcotics so suspect the comorbidities of her OA colored the statin side effect perception - states she restarted the atorvastatin at just twice a week w/o any increasing in MSK pain/weakness - has stopped again but agrees to restart lipitor 40 twice weekly  6. Class 2 severe obesity due to excess calories with serious comorbidity and body mass index (BMI) of 37.0 to 37.9 in adult (HCC)   7. Anxiety state - tried to wean pt off of xanax but panic got much worse - couldn't even go to church, friends noticed -restarted at lowest dose of 0.25mg  last visit  And pt reports doing dramatically better - pt definitely feels that continuing an the alprazolam is essential to her functioning - can't make it w/o. Sent in 3 mos supply of alprazolam so need OV for any additonal refills - again reviewed the high risk of CNS depression and unintentional OD when combining with narcotics which increases in risk every year as she ages and her drug metabolism ability decreases - pt aware to separate from narcotic use for 3-4 yrs.  Refilled prn hdyroxyzine for more mild anx sxs  8. Anemia, unspecified type   9. Encounter for medication monitoring   10. Hyponatremia   11. Encounter for long-term (current) use of high-risk medication - check b12 since on  metformin for a long time which can cause and pt is having sig fatigue, weakness, memory loss which could all be sxs of b12 def Start oral b12 supplement - recheck at f/u w/ b12 inj.  12.    Chronic sinusitis, unspecified location 13.    Non-seasonal allergic rhinitis, unspecified trigger -  Restart azelastine nasal spray and eye gtts 14.   Osteoarthrosis  involving lower leg 15.   Primary osteoarthritis of both knees - refill hydrocodone when needed. 16.   Chronic pain syndome  - Indication for chronic opioid: Chronic knee OA and lumbago    Orders Placed This Encounter  Procedures  . Comprehensive metabolic panel  . CBC with Differential/Platelet  . TSH  . Vitamin B12  . Lipid panel    Order Specific Question:   Has the patient fasted?    Answer:   Yes  . CBC with Differential/Platelet  . Comprehensive metabolic panel  . TSH  . Vitamin B12  . POCT glycosylated hemoglobin (Hb A1C)  . POCT glucose (manual entry)    Meds ordered this encounter  Medications  . gabapentin (NEURONTIN) 600 MG tablet    Sig: Take 1 tablet (600 mg total) by mouth 3 (three) times daily.    Dispense:  270 tablet    Refill:  1  . ALPRAZolam (XANAX) 0.25 MG tablet    Sig: Take 1-2 tablets (0.25-0.5 mg total) by mouth at bedtime as needed for anxiety or sleep.    Dispense:  180 tablet    Refill:  0  . azelastine (ASTELIN) 0.1 % nasal spray    Sig: Place 1 spray into both nostrils 2 (two) times daily. Use in each nostril as directed    Dispense:  30 mL    Refill:  1  . atorvastatin (LIPITOR) 40 MG tablet    Sig: Take 1 tab by mouth twice a week    Dispense:  90 tablet    Refill:  1  . lisinopril (PRINIVIL,ZESTRIL) 40 MG tablet    Sig: Take 1 tablet (40 mg total) by mouth daily.    Dispense:  90 tablet    Refill:  0    Pt does not need currently. Please add this on as refills to current rx.  Marland Kitchen azelastine (OPTIVAR) 0.05 % ophthalmic solution    Sig: Place 1 drop into both eyes 2 (two) times daily.    Dispense:  6 mL    Refill:  1    I personally performed the services described in this documentation, which was scribed in my presence. The recorded information has been reviewed and considered, and addended by me as needed.   Casey [name], M.D.  Primary Care at [facility] [address] [place], [place] [address] [phone] phone [phone] fax  07/14/17 1:43 AM

## 2017-07-01 NOTE — Patient Instructions (Addendum)
apply ice to the eye area to help reduce swelling and soreness: ? Put ice in a plastic bag. ? Place a towel between your skin and the bag. ? Leave the ice on for 20 minutes, 2-3 times per day. Start azelastine eye drops twice daily to reduce any seasonal eye allergies and itching.  Bring this new eye drop with you to your eye doctor appointment with Dr. Dione Booze this Tuesday.   IF you received an x-ray today, you will receive an invoice from [facility]. Please contact [facility] at [phone] with questions or concerns regarding your invoice.   IF you received labwork today, you will receive an invoice from [place]. Please contact [facility] at [phone] with questions or concerns regarding your invoice.   Our billing staff will not be able to assist you with questions regarding bills from these companies.  You will be contacted with the lab results as soon as they are available. The fastest way to get your results is to activate your My Chart account. Instructions are located on the last page of this paperwork. If you have not heard from [name] regarding the results in 2 weeks, please contact this office.    Blepharoplasty, Care After Refer to this sheet in the next few weeks. These instructions provide you with information about caring for yourself after your procedure. Your health care provider may also give you more specific instructions. Your treatment has been planned according to current medical practices, but problems sometimes occur. Call your health care provider if you have any problems or questions after your procedure. What can I expect after the procedure? After the procedure, it is common to have:  Swelling.  Bruising.  Soreness.  Sticky, dry, and itchy eyes.  Follow these instructions at home: Medicines  Take medicines only as directed by your health care provider.  Use eye drops or ointment as directed by your health care provider. Incision  care  Do not soak or wash your face until your health care provider says that you can. Follow instructions from your health care provider about bathing.  There are many different ways to close and cover an incision, including stitches (sutures), skin glue, and adhesive strips. Follow instructions from your health care provider about: ? Incision care. ? Incision closure removal.  If directed, apply ice to the eye area to help reduce swelling and soreness: ? Put ice in a plastic bag. ? Place a towel between your skin and the bag. ? Leave the ice on for 20 minutes, 2-3 times per day. Activity  Use a few pillows to keep your head raised while you are sleeping or resting.  Do not bend over. Bending over causes your head to be lower than your heart.  Do not do any activities that require a lot of effort or energy (strenuous activities) until your health care provider approves.  Do not lift anything that is heavier than 10 lb (4.5 kg). General instructions  Do not use any tobacco products, including cigarettes, chewing tobacco, or electronic cigarettes. If you need help quitting, ask your health care provider.  To protect your eyes from the sun, wear dark sunglasses and a wide-brimmed hat. Do this until healing is complete. This helps to keep the suture areas from becoming discolored.  Keep all follow-up visits as directed by your health care provider. This is important. Contact a health care provider if:  You have a fever.  You have dryness, pain, swelling, or bruising that is not  getting better.  You have fluid, blood, or pus coming from your incision.  You cannot close your eyes completely.  Your eyeball is bulging, or your eyeball position is different from normal.  You have double vision or blurry vision that is not getting better.  You have any change in your vision. This information is not intended to replace advice given to you by your health care provider. Make sure you  discuss any questions you have with your health care provider. Document Released: 07/25/2004 Document Revised: 06/13/2015 Document Reviewed: 12/27/2013 Elsevier Interactive Patient Education  [employer]2018 Elsevier Inc.

## 2017-07-02 LAB — COMPREHENSIVE METABOLIC PANEL
A/G RATIO: 1.4 (ref 1.2–2.2)
ALBUMIN: 4.1 g/dL (ref 3.5–4.7)
ALT: 11 IU/L (ref 0–32)
AST: 15 IU/L (ref 0–40)
Alkaline Phosphatase: 96 IU/L (ref 39–117)
BUN / CREAT RATIO: 16 (ref 12–28)
BUN: 14 mg/dL (ref 8–27)
Bilirubin Total: 0.4 mg/dL (ref 0.0–1.2)
CALCIUM: 9.5 mg/dL (ref 8.7–10.3)
CO2: 25 mmol/L (ref 20–29)
Chloride: 100 mmol/L (ref 96–106)
Creatinine, Ser: 0.86 mg/dL (ref 0.57–1.00)
GFR, EST AFRICAN AMERICAN: 74 mL/min/{1.73_m2} (ref >59)
GFR, EST NON AFRICAN AMERICAN: 64 mL/min/{1.73_m2} (ref >59)
GLOBULIN, TOTAL: 2.9 g/dL (ref 1.5–4.5)
Glucose: 154 mg/dL — ABNORMAL HIGH (ref 65–99)
Potassium: 3.9 mmol/L (ref 3.5–5.2)
SODIUM: 138 mmol/L (ref 134–144)
Total Protein: 7 g/dL (ref 6.0–8.5)

## 2017-07-02 LAB — LIPID PANEL
CHOLESTEROL TOTAL: 250 mg/dL — AB (ref 100–199)
Chol/HDL Ratio: 3.5 ratio (ref 0.0–4.4)
HDL: 72 mg/dL (ref >39)
LDL CALC: 162 mg/dL — AB (ref 0–99)
Triglycerides: 79 mg/dL (ref 0–149)
VLDL CHOLESTEROL CAL: 16 mg/dL (ref 5–40)

## 2017-07-02 LAB — CBC WITH DIFFERENTIAL/PLATELET
BASOS: 1 %
Basophils Absolute: 0.1 10*3/uL (ref 0.0–0.2)
EOS (ABSOLUTE): 0.2 10*3/uL (ref 0.0–0.4)
Eos: 2 %
Hematocrit: 35.1 % (ref 34.0–46.6)
Hemoglobin: 11.8 g/dL (ref 11.1–15.9)
Immature Grans (Abs): 0 10*3/uL (ref 0.0–0.1)
Immature Granulocytes: 0 %
Lymphocytes Absolute: 2.4 10*3/uL (ref 0.7–3.1)
Lymphs: 31 %
MCH: 28.2 pg (ref 26.6–33.0)
MCHC: 33.6 g/dL (ref 31.5–35.7)
MCV: 84 fL (ref 79–97)
MONOS ABS: 0.4 10*3/uL (ref 0.1–0.9)
Monocytes: 5 %
NEUTROS ABS: 4.8 10*3/uL (ref 1.4–7.0)
Neutrophils: 61 %
PLATELETS: 293 10*3/uL (ref 150–450)
RBC: 4.19 x10E6/uL (ref 3.77–5.28)
RDW: 14.6 % (ref 12.3–15.4)
WBC: 7.7 10*3/uL (ref 3.4–10.8)

## 2017-07-02 LAB — TSH: TSH: 1.89 u[IU]/mL (ref 0.450–4.500)

## 2017-07-02 LAB — VITAMIN B12: Vitamin B-12: 310 pg/mL (ref 232–1245)

## 2017-07-12 ENCOUNTER — Other Ambulatory Visit: Payer: Self-pay | Admitting: Family Medicine

## 2017-07-12 DIAGNOSIS — M170 Bilateral primary osteoarthritis of knee: Secondary | ICD-10-CM

## 2017-07-12 NOTE — Telephone Encounter (Signed)
Hydrocodone-acetaminophen refill Last Refill:05/26/17 # 120 Last OV: 07/01/17 PCP: Dr. Clelia CroftShaw Pharmacy: [facility] [address]

## 2017-07-12 NOTE — Telephone Encounter (Signed)
Copied from CRM [id number]. Topic: Quick Communication - Rx Refill/Question >> Jul 12, 2017 11:47 AM Oneal GroutSebastian, Jennifer S wrote: Medication: HYDROcodone-acetaminophen (NORCO/VICODIN) 5-325 MG tablet   Has the patient contacted their pharmacy? Yes.   (Agent: If no, request that the patient contact the pharmacy for the refill.) (Agent: If yes, when and what did the pharmacy advise?)  Preferred Pharmacy (with phone number or street name): [facility] [address]  Agent: Please be advised that RX refills may take up to 3 business days. We ask that you follow-up with your pharmacy.

## 2017-07-14 ENCOUNTER — Encounter: Payer: Self-pay | Admitting: Radiology

## 2017-07-14 DIAGNOSIS — E538 Deficiency of other specified B group vitamins: Secondary | ICD-10-CM | POA: Insufficient documentation

## 2017-07-14 MED ORDER — HYDROCODONE-ACETAMINOPHEN 5-325 MG PO TABS: 1.0000 | ORAL_TABLET | Freq: Four times a day (QID) | ORAL | 0 refills | Status: DC | PRN

## 2017-08-06 ENCOUNTER — Encounter: Payer: Self-pay | Admitting: Podiatry

## 2017-08-06 ENCOUNTER — Ambulatory Visit (INDEPENDENT_AMBULATORY_CARE_PROVIDER_SITE_OTHER): Payer: Medicare Other | Admitting: Podiatry

## 2017-08-06 DIAGNOSIS — M79674 Pain in right toe(s): Secondary | ICD-10-CM

## 2017-08-06 DIAGNOSIS — B351 Tinea unguium: Secondary | ICD-10-CM | POA: Diagnosis not present

## 2017-08-06 DIAGNOSIS — M79675 Pain in left toe(s): Secondary | ICD-10-CM | POA: Diagnosis not present

## 2017-08-08 NOTE — Progress Notes (Signed)
Subjective: [name] presents to clinic today for preventative foot care. She complains of painful, discolored, thick toenails which interfere with activities of daily living. Pain is aggravated when wearing enclosed shoe gear. Pain is getting progressively worse and relieved with periodic professional debridement.  Objective: There were no vitals filed for this visit. Vascular Examination: Capillary refill time immediate x 10 digits Dorsalis pedis and posterior tibial pulses present b/l No digital hair x 10 digits Skin temperature warm to warm b/l  Dermatological Examination: Skin with normal turgor texture and tone b/l Toenails 1-5 b/l discolored, thick, dystrophic with subungual debris and pain with palpation to nailbeds due to thickness of nails.  Musculoskeletal: Muscle strength 5/5 to all LE muscle groups Pes planus foot deformity b/l  Neurological: Sensation intact with 10 gram monofilament. Vibratory sensation intact.  Assessment: Painful onychomycosis toenails 1-5 b/l   Plan: 1. Toenails 1-5 b/l were debrided in length and girth without iatrogenic bleeding. 2. Patient to continue soft, supportive shoe gear 3. Patient to report any pedal injuries to medical professional immediately. 4. Follow up 3 months.  5. Patient/POA to call should there be a concern in the interim.

## 2017-08-25 ENCOUNTER — Telehealth: Payer: Self-pay | Admitting: Family Medicine

## 2017-08-25 NOTE — Telephone Encounter (Signed)
Copied from CRM [id number]. Topic: Quick Communication - Rx Refill/Question >> Aug 25, 2017  2:54 PM Burchel, Abbi R wrote: Medication: omeprazole (PRILOSEC) 40 MG capsule    [facility] [phone]#[id number] [place] - [phone] [facility] [address] [place] [id number] Phone: [phone] Fax: [phone]     Pt was advised that RX refills may take up to 3 business days.

## 2017-08-31 ENCOUNTER — Other Ambulatory Visit: Payer: Self-pay | Admitting: *Deleted

## 2017-08-31 ENCOUNTER — Other Ambulatory Visit: Payer: Self-pay | Admitting: Family Medicine

## 2017-08-31 MED ORDER — OMEPRAZOLE 40 MG PO CPDR: 40.0000 mg | DELAYED_RELEASE_CAPSULE | Freq: Every day | ORAL | 0 refills | Status: DC

## 2017-09-09 ENCOUNTER — Other Ambulatory Visit: Payer: Self-pay | Admitting: Family Medicine

## 2017-09-09 DIAGNOSIS — M170 Bilateral primary osteoarthritis of knee: Secondary | ICD-10-CM

## 2017-09-09 NOTE — Telephone Encounter (Signed)
Vicodin 5-325 mgrefill Last Refill:07/14/17 # 120 Last OV: 07/01/17 PCP: Clelia CroftShaw Pharmacy:[facility] [phone]

## 2017-09-09 NOTE — Telephone Encounter (Signed)
Copied from CRM [id number]. Topic: Quick Communication - See Telephone Encounter >> Sep 09, 2017  9:22 AM Trula SladeWalter, Linda F wrote: CRM for notification. See Telephone encounter for: 09/09/17. Patient would like a refill for her HYDROcodone-acetaminophen (NORCO/VICODIN) 5-325 MG tablet medication and have it sent to her preferred pharmacy [facility] on [address]

## 2017-09-10 NOTE — Telephone Encounter (Signed)
Patient is requesting a refill of the following medications: Requested Prescriptions   Pending Prescriptions Disp Refills  . HYDROcodone-acetaminophen (NORCO/VICODIN) 5-325 MG tablet 120 tablet 0    Sig: Take 1 tablet by mouth every 6 (six) hours as needed for moderate pain. For chronic pain    Date of patient request: 09/09/2017 Last office visit: 07/01/2017 Date of last refill: 07/14/2017 Last refill amount: 120 Follow up time period per chart:

## 2017-09-13 MED ORDER — HYDROCODONE-ACETAMINOPHEN 5-325 MG PO TABS: 1.0000 | ORAL_TABLET | Freq: Four times a day (QID) | ORAL | 0 refills | Status: DC | PRN

## 2017-09-13 NOTE — Telephone Encounter (Signed)
Refilled. Pt has appt w/ me on 9/16 - will discuss further at that time.

## 2017-09-17 ENCOUNTER — Other Ambulatory Visit: Payer: Self-pay | Admitting: Family Medicine

## 2017-09-17 NOTE — Telephone Encounter (Signed)
Xanax refill Last Refill:07/01/17 # 180 with 0 refill Last OV: 07/01/17 PCP: Dr. Clelia CroftShaw Pharmacy:[facility] [address]

## 2017-09-23 ENCOUNTER — Telehealth: Payer: Self-pay | Admitting: Family Medicine

## 2017-09-23 NOTE — Telephone Encounter (Signed)
Patient is wanting to get a refill of Alprazolam to last her until her appt with Whitney McVey on 10/04/2017 (moved from Dr. Alver Fisher scheduled on the same day). Pt states she has 5 left.

## 2017-09-29 ENCOUNTER — Other Ambulatory Visit: Payer: Self-pay | Admitting: Physician Assistant

## 2017-09-29 DIAGNOSIS — F411 Generalized anxiety disorder: Secondary | ICD-10-CM

## 2017-09-29 MED ORDER — ALPRAZOLAM 0.25 MG PO TABS: 0.2500 mg | ORAL_TABLET | Freq: Every evening | ORAL | 0 refills | Status: DC | PRN

## 2017-09-29 NOTE — Telephone Encounter (Signed)
Pt calling about her medicine Alprazolam she is out now she called on the 5th of this month please give her a call back today [phone]

## 2017-10-02 NOTE — Telephone Encounter (Signed)
Needs OV for any additional refills - pt has appt sched w/ McVey for 9/24 and she was kind enough to provide short term refill so pt can make it to that appt.

## 2017-10-04 ENCOUNTER — Ambulatory Visit: Payer: Medicare Other | Admitting: Physician Assistant

## 2017-10-12 ENCOUNTER — Encounter: Payer: Self-pay | Admitting: Physician Assistant

## 2017-10-12 ENCOUNTER — Ambulatory Visit (INDEPENDENT_AMBULATORY_CARE_PROVIDER_SITE_OTHER): Payer: Medicare Other | Admitting: Physician Assistant

## 2017-10-12 VITALS — BP 136/76 | HR 81 | Temp 98.7°F | Resp 16 | Ht 62.0 in | Wt 200.2 lb

## 2017-10-12 DIAGNOSIS — F411 Generalized anxiety disorder: Secondary | ICD-10-CM

## 2017-10-12 DIAGNOSIS — G609 Hereditary and idiopathic neuropathy, unspecified: Secondary | ICD-10-CM

## 2017-10-12 DIAGNOSIS — R809 Proteinuria, unspecified: Secondary | ICD-10-CM

## 2017-10-12 DIAGNOSIS — H8110 Benign paroxysmal vertigo, unspecified ear: Secondary | ICD-10-CM

## 2017-10-12 DIAGNOSIS — M170 Bilateral primary osteoarthritis of knee: Secondary | ICD-10-CM

## 2017-10-12 DIAGNOSIS — E1129 Type 2 diabetes mellitus with other diabetic kidney complication: Secondary | ICD-10-CM

## 2017-10-12 DIAGNOSIS — R7309 Other abnormal glucose: Secondary | ICD-10-CM | POA: Diagnosis not present

## 2017-10-12 DIAGNOSIS — K219 Gastro-esophageal reflux disease without esophagitis: Secondary | ICD-10-CM

## 2017-10-12 DIAGNOSIS — I10 Essential (primary) hypertension: Secondary | ICD-10-CM | POA: Diagnosis not present

## 2017-10-12 LAB — POCT URINALYSIS DIP (MANUAL ENTRY)
Bilirubin, UA: NEGATIVE
Blood, UA: NEGATIVE
Glucose, UA: NEGATIVE mg/dL
Ketones, POC UA: NEGATIVE mg/dL
Leukocytes, UA: NEGATIVE
Nitrite, UA: NEGATIVE
Protein Ur, POC: NEGATIVE mg/dL
Spec Grav, UA: 1.015 (ref 1.010–1.025)
Urobilinogen, UA: 0.2 U/dL
pH, UA: 7.5 (ref 5.0–8.0)

## 2017-10-12 LAB — POCT GLYCOSYLATED HEMOGLOBIN (HGB A1C): Hemoglobin A1C: 6.5 % — AB (ref 4.0–5.6)

## 2017-10-12 LAB — GLUCOSE, POCT (MANUAL RESULT ENTRY): POC Glucose: 120 mg/dL — AB (ref 70–99)

## 2017-10-12 MED ORDER — MECLIZINE HCL 25 MG PO TABS: 25.0000 mg | ORAL_TABLET | Freq: Three times a day (TID) | ORAL | 1 refills | Status: DC

## 2017-10-12 MED ORDER — METFORMIN HCL 500 MG PO TABS: 500.0000 mg | ORAL_TABLET | Freq: Two times a day (BID) | ORAL | 1 refills | Status: DC

## 2017-10-12 MED ORDER — HYDROCODONE-ACETAMINOPHEN 5-325 MG PO TABS: 1.0000 | ORAL_TABLET | Freq: Four times a day (QID) | ORAL | 0 refills | Status: DC | PRN

## 2017-10-12 MED ORDER — AMLODIPINE BESYLATE 5 MG PO TABS: 5.0000 mg | ORAL_TABLET | Freq: Every day | ORAL | 1 refills | Status: DC

## 2017-10-12 MED ORDER — GABAPENTIN 600 MG PO TABS: 600.0000 mg | ORAL_TABLET | Freq: Three times a day (TID) | ORAL | 1 refills | Status: DC

## 2017-10-12 MED ORDER — ALPRAZOLAM 0.25 MG PO TABS: 0.2500 mg | ORAL_TABLET | Freq: Every evening | ORAL | 1 refills | Status: DC | PRN

## 2017-10-12 MED ORDER — OMEPRAZOLE 40 MG PO CPDR: 40.0000 mg | DELAYED_RELEASE_CAPSULE | Freq: Every day | ORAL | 1 refills | Status: DC

## 2017-10-12 MED ORDER — LISINOPRIL 40 MG PO TABS: 40.0000 mg | ORAL_TABLET | Freq: Every day | ORAL | 1 refills | Status: DC

## 2017-10-12 NOTE — Patient Instructions (Addendum)
  Come back and see Dr. Clelia CroftShaw in 3 months.    If you have lab work done today you will be contacted with your lab results within the next 2 weeks.  If you have not heard from us then please contact us. The fastest way to get your results is to register for My Chart.   IF you received an x-ray today, you will receive an invoice from [facility]. Please contact [facility] at [phone] with questions or concerns regarding your invoice.   IF you received labwork today, you will receive an invoice from [facility]. Please contact [facility] at [phone] with questions or concerns regarding your invoice.   Our billing staff will not be able to assist you with questions regarding bills from these companies.  You will be contacted with the lab results as soon as they are available. The fastest way to get your results is to activate your My Chart account. Instructions are located on the last page of this paperwork. If you have not heard from us regarding the results in 2 weeks, please contact this office.

## 2017-10-12 NOTE — Progress Notes (Addendum)
 Casey [name]  MRN: [id number] DOB: [birth year]  PCP: Sherren Mocha, MD  Subjective:  Pt presents to clinic for DM check and medication refills. She is a pt of Dr. Alver Fisher.   DM - Controlled with metformin 500mg  bid. Endorses medication compliance.  Last A1C 3 months ago was 6.7 - OV with Dr. Clelia Croft.  She is not taking her B12.   She needs refills of the following medications:   Alprazolam 0.25 mg bid - she sometimes takes 0.5mg . Takes mostly at night. Also takes if she feels anxiety attacks start to happen, which is not often.   hydrocodone-ace 5-325 mg every 6 hours - takes this for b/l knee pain, osteoarthritis b/l knees and chronic pain syndome .  Gabapentin for neuropathic pain.   HTN - controlled on Amlodipine Besylate 5 mg,  Lisinopril 40 mg. Blood pressure today is 136/76. Denies lightheadedness, dizziness, chronic headache, double vision, chest pain, shortness of breath, heart racing, palpitations, nausea, vomiting, abdominal pain, hematuria, lower leg swelling.  Meclizine 25 mg tid - takes this for inner ear problem  Omeprazole 40 mg  Review of Systems  Constitutional: Negative for diaphoresis and fatigue.  Cardiovascular: Negative for chest pain and palpitations.  Musculoskeletal: Positive for arthralgias. Negative for joint swelling.  Skin: Negative.     Patient Active Problem List   Diagnosis Date Noted  . Vitamin B12 deficiency 07/14/2017  . DJD (degenerative joint disease) of knee 07/07/2013  . Type 2 diabetes mellitus with microalbuminuria, without long-term current use of insulin (HCC) 05/26/2012  . Anemia 10/23/2011  . Encounter for medication monitoring 10/23/2011  . Hyponatremia 12/01/2010    Class: Acute  . Long Q-T syndrome 11/26/2010  . CYSTOCELE WITH INCOMPLETE UTERINE PROLAPSE 03/10/2010  . HYPERGLYCEMIA 02/06/2010  . ESOPHAGEAL STRICTURE 12/11/2009  . Obesity 12/09/2009  . PROBLEMS WITH SWALLOWING AND MASTICATION 12/09/2009  . MICROSCOPIC HEMATURIA  07/29/2009  . ARM PAIN, RIGHT 03/28/2009  . Fatigue 03/28/2009  . CONSTIPATION 09/25/2008  . Sinusitis, chronic 02/23/2008  . Osteoarthrosis involving lower leg 09/28/2007  . LEG EDEMA, BILATERAL 08/31/2007  . FLANK PAIN, LEFT 06/08/2007  . SPRAIN&STRAIN OTH SPEC SITES SHOULDER&UPPER ARM 06/08/2007  . BENIGN POSITIONAL VERTIGO 04/25/2007  . Headache 03/24/2007  . HYPERCHOLESTEROLEMIA 03/03/2007  . Anxiety state 03/03/2007  . HYPERTENSION, BENIGN ESSENTIAL 03/03/2007  . Allergic rhinitis 03/03/2007  . HIP PAIN, LEFT 03/03/2007  . Osteoporosis 03/03/2007    Current Outpatient Medications on File Prior to Visit  Medication Sig Dispense Refill  . ALPRAZolam (XANAX) 0.25 MG tablet Take 1-2 tablets (0.25-0.5 mg total) by mouth at bedtime as needed for anxiety or sleep. 14 tablet 0  . amLODipine (NORVASC) 5 MG tablet Take 1 tablet (5 mg total) by mouth daily. 90 tablet 1  . azelastine (ASTELIN) 0.1 % nasal spray Place 1 spray into both nostrils 2 (two) times daily. Use in each nostril as directed 30 mL 1  . azelastine (OPTIVAR) 0.05 % ophthalmic solution Place 1 drop into both eyes 2 (two) times daily. 6 mL 1  . gabapentin (NEURONTIN) 600 MG tablet Take 1 tablet (600 mg total) by mouth 3 (three) times daily. 270 tablet 1  . HYDROcodone-acetaminophen (NORCO/VICODIN) 5-325 MG tablet Take 1 tablet by mouth every 6 (six) hours as needed for moderate pain. For chronic pain 120 tablet 0  . lisinopril (PRINIVIL,ZESTRIL) 40 MG tablet Take 1 tablet (40 mg total) by mouth daily. 90 tablet 0  . meclizine (ANTIVERT) 25 MG tablet Take 1  tablet (25 mg total) by mouth 3 (three) times daily. 270 tablet 1  . metFORMIN (GLUCOPHAGE) 500 MG tablet Take 1 tablet (500 mg total) by mouth 2 (two) times daily with a meal. 180 tablet 1  . omeprazole (PRILOSEC) 40 MG capsule Take 1 capsule (40 mg total) by mouth daily. 30 capsule 0  . aspirin EC 81 MG tablet Take 1 tablet (81 mg total) by mouth daily. (Patient not  taking: Reported on 10/12/2017)    . atorvastatin (LIPITOR) 40 MG tablet Take 1 tab by mouth twice a week (Patient not taking: Reported on 10/12/2017) 90 tablet 1  . bacitracin ophthalmic ointment   0  . Cholecalciferol (VITAMIN D3) 5000 units CAPS Take 1 capsule by mouth daily.    Marland Kitchen glucose blood test strip 1 each by Other route daily. Use as instructed (Patient not taking: Reported on 03/29/2017) 50 each 11  . hydrOXYzine (VISTARIL) 25 MG capsule Take 1 capsule (25 mg total) by mouth 3 (three) times daily as needed for anxiety. (Patient not taking: Reported on 10/12/2017) 30 capsule 2  . nortriptyline (PAMELOR) 50 MG capsule Take 1 capsule (50 mg total) by mouth at bedtime. (Patient not taking: Reported on 10/12/2017) 90 capsule 1  . propranolol ER (INDERAL LA) 60 MG 24 hr capsule Take 1 capsule (60 mg total) by mouth daily. (Patient not taking: Reported on 10/12/2017) 90 capsule 1  . tobramycin-dexamethasone (TOBRADEX) ophthalmic solution instill 1 drop into left eye four times a day if needed  0   No current facility-administered medications on file prior to visit.     No Known Allergies   Objective:  BP 136/76 (BP Location: Left Arm, Patient Position: Sitting, Cuff Size: Large)   Pulse 81   Temp 98.7 F (37.1 C) (Oral)   Resp 16   Ht 5\' 2"  (1.575 m)   Wt 200 lb 3.2 oz (90.8 kg)   SpO2 97%   BMI 36.62 kg/m  Diabetic Foot Exam - Simple   Simple Foot Form Diabetic Foot exam was performed with the following findings:  Yes 10/12/2017  9:13 AM  Visual Inspection No deformities, no ulcerations, no other skin breakdown bilaterally:  Yes Sensation Testing Intact to touch and monofilament testing bilaterally:  Yes Pulse Check Posterior Tibialis and Dorsalis pulse intact bilaterally:  Yes Comments     Physical Exam  Constitutional: She is oriented to person, place, and time. No distress.  Neck: Carotid bruit is not present.  Cardiovascular: Normal rate, regular rhythm and normal heart  sounds.  Neurological: She is alert and oriented to person, place, and time.  Skin: Skin is warm and dry.  Psychiatric: Judgment normal.  Vitals reviewed.  Results for orders placed or performed in visit on 10/12/17  POCT glucose (manual entry)  Result Value Ref Range   POC Glucose 120 (A) 70 - 99 mg/dl  POCT urinalysis dipstick  Result Value Ref Range   Color, UA yellow yellow   Clarity, UA clear clear   Glucose, UA negative negative mg/dL   Bilirubin, UA negative negative   Ketones, POC UA negative negative mg/dL   Spec Grav, UA 1.610 9.604 - 1.025   Blood, UA negative negative   pH, UA 7.5 5.0 - 8.0   Protein Ur, POC negative negative mg/dL   Urobilinogen, UA 0.2 0.2 or 1.0 E.U./dL   Nitrite, UA Negative Negative   Leukocytes, UA Negative Negative  POCT glycosylated hemoglobin (Hb A1C)  Result Value Ref Range   Hemoglobin  A1C 6.5 (A) 4.0 - 5.6 %   HbA1c POC (<> result, manual entry)     HbA1c, POC (prediabetic range)     HbA1c, POC (controlled diabetic range)      Assessment and Plan :  1. Type 2 diabetes mellitus with microalbuminuria, without long-term current use of insulin (HCC) 2. Blood sugar increased - Endorses medication compliance. Last A1C 3 months ago was 6.7, today is 6.5 - f/u with Dr. Clelia Croft in three months. She never started B12 supplement. - metFORMIN (GLUCOPHAGE) 500 MG tablet; Take 1 tablet (500 mg total) by mouth 2 (two) times daily with a meal.  Dispense: 180 tablet; Refill: 1 - POCT glucose (manual entry) - HM Diabetes Foot Exam - POCT urinalysis dipstick - POCT glycosylated hemoglobin (Hb A1C)  3. Hereditary and idiopathic peripheral neuropathy - gabapentin (NEURONTIN) 600 MG tablet; Take 1 tablet (600 mg total) by mouth 3 (three) times daily.  Dispense: 270 tablet; Refill: 1  4. HYPERTENSION, BENIGN ESSENTIAL - Controlled. Blood pressure today is 136/76.  - amLODipine (NORVASC) 5 MG tablet; Take 1 tablet (5 mg total) by mouth daily.  Dispense: 90  tablet; Refill: 1 - lisinopril (PRINIVIL,ZESTRIL) 40 MG tablet; Take 1 tablet (40 mg total) by mouth daily.  Dispense: 90 tablet; Refill: 1  5. Anxiety state - ALPRAZolam (XANAX) 0.25 MG tablet; Take 1-2 tablets (0.25-0.5 mg total) by mouth at bedtime as needed for anxiety or sleep.  Dispense: 60 tablet; Refill: 1  6. Primary osteoarthritis of both knees - HYDROcodone-acetaminophen (NORCO/VICODIN) 5-325 MG tablet; Take 1 tablet by mouth every 6 (six) hours as needed for moderate pain. For chronic pain  Dispense: 120 tablet; Refill: 0  7. Benign paroxysmal positional vertigo, unspecified laterality - meclizine (ANTIVERT) 25 MG tablet; Take 1 tablet (25 mg total) by mouth 3 (three) times daily.  Dispense: 270 tablet; Refill: 1  8. Gastroesophageal reflux disease, esophagitis presence not specified - omeprazole (PRILOSEC) 40 MG capsule; Take 1 capsule (40 mg total) by mouth daily.  Dispense: 30 capsule; Refill: 1  Whitney Dorette Hartel, PA-C  Primary Care at [facility] 10/12/2017 9:50 AM  Please note: Portions of this report may have been transcribed using dragon voice recognition software. Every effort was made to ensure accuracy; however, inadvertent computerized transcription errors may be present.

## 2017-10-14 ENCOUNTER — Other Ambulatory Visit: Payer: Self-pay | Admitting: Family Medicine

## 2017-10-14 DIAGNOSIS — Z1231 Encounter for screening mammogram for malignant neoplasm of breast: Secondary | ICD-10-CM

## 2017-10-21 ENCOUNTER — Other Ambulatory Visit: Payer: Self-pay | Admitting: Family Medicine

## 2017-10-21 NOTE — Telephone Encounter (Signed)
Requested Prescriptions  Pending Prescriptions Disp Refills  . azelastine (ASTELIN) 0.1 % nasal spray [Pharmacy Med Name: AZELASTINE 0.1%(137MCG) NASAL-200SP] 30 mL 0    Sig: INSTILL 1 SPRAY INTO BOTH NOSTRILS TWICE DAILY AS DIRECTED     Ear, Nose, and Throat: Nasal Preparations - Antiallergy Passed - 10/21/2017 11:04 AM      Passed - Valid encounter within last 12 months    Recent Outpatient Visits          1 week ago Type 2 diabetes mellitus with microalbuminuria, without long-term current use of insulin (HCC)   Primary Care at [facility], Madelaine Bhat, PA-C   3 months ago Type 2 diabetes mellitus with microalbuminuria, without long-term current use of insulin [facility])   [facility] at [facility], Levell July, MD   6 months ago Type 2 diabetes mellitus with microalbuminuria, without long-term current use of insulin [facility])   [facility] at [facility], Levell July, MD   8 months ago Microscopic hematuria   [facility] at [facility], Levell July, MD   9 months ago Type 2 diabetes mellitus with microalbuminuria, without long-term current use of insulin [facility])   [facility] at [facility], Levell July, MD      Future Appointments            In 2 months Sherren Mocha, MD Primary Care at [place], [facility]         . azelastine (OPTIVAR) 0.05 % ophthalmic solution [Pharmacy Med Name: AZELASTINE 0.05% OPHTH SOLUTION 6ML] 6 mL 0    Sig: INSTILL 1 DROP IN BOTH EYES TWICE DAILY     Ophthalmology:  Antiallergy Passed - 10/21/2017 11:04 AM      Passed - Valid encounter within last 12 months    Recent Outpatient Visits          1 week ago Type 2 diabetes mellitus with microalbuminuria, without long-term current use of insulin (HCC)   Primary Care at [facility], Madelaine Bhat, PA-C   3 months ago Type 2 diabetes mellitus with microalbuminuria, without long-term current use of insulin [facility])   [facility] at [facility], Levell July, MD   6 months ago Type 2 diabetes mellitus with microalbuminuria, without long-term  current use of insulin [facility])   [facility] at [facility], Levell July, MD   8 months ago Microscopic hematuria   [facility] at [facility], Levell July, MD   9 months ago Type 2 diabetes mellitus with microalbuminuria, without long-term current use of insulin [facility])   [facility] at [facility], Levell July, MD      Future Appointments            In 2 months Sherren Mocha, MD Primary Care at [place], [facility]

## 2017-11-09 ENCOUNTER — Ambulatory Visit (INDEPENDENT_AMBULATORY_CARE_PROVIDER_SITE_OTHER): Payer: Medicare Other | Admitting: Podiatry

## 2017-11-09 ENCOUNTER — Encounter: Payer: Self-pay | Admitting: Podiatry

## 2017-11-09 DIAGNOSIS — B351 Tinea unguium: Secondary | ICD-10-CM | POA: Diagnosis not present

## 2017-11-09 DIAGNOSIS — M79674 Pain in right toe(s): Secondary | ICD-10-CM

## 2017-11-09 DIAGNOSIS — M79675 Pain in left toe(s): Secondary | ICD-10-CM

## 2017-11-10 ENCOUNTER — Ambulatory Visit: Payer: Self-pay

## 2017-11-10 NOTE — Telephone Encounter (Signed)
Patient called in with c/o "chest pain." She says "I have been moving furniture in my house and my chest has been hurting for the past 3 days. The pain is between my breasts across on the top of both breasts, severe pain at times." I asked about difficulty breathing, she denies. I asked about other symptoms, she denies. According to protocol, see PCP within 24 hours, no availability with PCP, appointment scheduled for tomorrow, 11/11/17 at 1100 with Whitney McVey, PA-C, care advice given, patient verbalized understanding. She says she will call and see if she can find a ride for tomorrow. I advised to call back to let us know if she will be able to keep the appointment.    Reason for Disposition . [1] High-risk adult (e.g., age > 4, osteoporosis, chronic steroid use) AND [2] still hurts  Answer Assessment - Initial Assessment Questions 1. MECHANISM: "How did the injury happen?"     Moving furniture  2. ONSET: "When did the injury happen?" (e.g., minutes, hours, days ago)     3 days ago 3. LOCATION: "Where on the chest is the injury located?" "Where does it hurt?"     Between my breast across on the top of the breast on both sides 4. CHEST OR RIB PAIN SEVERITY: "How bad is the pain?"  (e.g., Scale 1-10; mild, moderate, or severe)    - MILD (1-3): doesn't interfere with normal activities     - MODERATE (4-7): interferes with normal activities or awakens from sleep    - SEVERE (8-10): excruciating pain, unable to do any normal activities       Severe 5. BREATHING DIFFICULTY: "Are you having any difficulty breathing?" If so, ask "How bad is it?"  (e.g., none, mild, moderate, severe)       No  6: OTHER SYMPTOMS (e.g., cough, fever, rash)      No 7. PREGNANCY: "Is there any chance you are pregnant?" "When was your last menstrual period?"     No  Protocols used: CHEST INJURY - BENDING, LIFTING, OR TWISTING-A-AH

## 2017-11-11 ENCOUNTER — Encounter: Payer: Self-pay | Admitting: Physician Assistant

## 2017-11-11 ENCOUNTER — Other Ambulatory Visit: Payer: Self-pay

## 2017-11-11 ENCOUNTER — Ambulatory Visit (INDEPENDENT_AMBULATORY_CARE_PROVIDER_SITE_OTHER): Payer: Medicare Other | Admitting: Physician Assistant

## 2017-11-11 VITALS — BP 120/74 | HR 81 | Temp 98.1°F | Resp 16 | Ht 62.0 in | Wt 202.2 lb

## 2017-11-11 DIAGNOSIS — R0789 Other chest pain: Secondary | ICD-10-CM | POA: Diagnosis not present

## 2017-11-11 DIAGNOSIS — R079 Chest pain, unspecified: Secondary | ICD-10-CM

## 2017-11-11 LAB — POCT URINALYSIS DIP (MANUAL ENTRY)
Bilirubin, UA: NEGATIVE
Blood, UA: NEGATIVE
Glucose, UA: NEGATIVE mg/dL
Ketones, POC UA: NEGATIVE mg/dL
Leukocytes, UA: NEGATIVE
Nitrite, UA: NEGATIVE
Spec Grav, UA: 1.015 (ref 1.010–1.025)
Urobilinogen, UA: 0.2 E.U./dL
pH, UA: 7.5 (ref 5.0–8.0)

## 2017-11-11 MED ORDER — DICLOFENAC SODIUM 1 % TD GEL: 2.0000 g | Freq: Four times a day (QID) | TRANSDERMAL | 1 refills | Status: DC

## 2017-11-11 NOTE — Patient Instructions (Addendum)
Your EKG looks great Voltaren gel: Apply 2 g to 4 g to the skin over affected area(s) 3 or 4 times daily for up to 7 days  You can also try over the counter Aspercreme.    Apply heat to the area. Wet a towel and wring it out so it is damp. Put it in the microwave for about 15-20 seconds - long enough to make it hot, but not too hot to apply to your skin causing burns. Do this for about 20-30 minutes, 3-4 times a day. Or use your heating pad.   Perform gentle, light stretches 2-3 times a day. Keep moving - this will help your muscles  Stay well hydrated - try to drink 32-64 oz/day.   Come back if you are not improving.  Go to the emergency department if your symptoms worsen.   If you have lab work done today you will be contacted with your lab results within the next 2 weeks.  If you have not heard from [name] then please contact us. The fastest way to get your results is to register for My Chart.   IF you received an x-ray today, you will receive an invoice from [facility]. Please contact [facility] at [phone] with questions or concerns regarding your invoice.   IF you received labwork today, you will receive an invoice from [place]. Please contact [facility] at [phone] with questions or concerns regarding your invoice.   Our billing staff will not be able to assist you with questions regarding bills from these companies.  You will be contacted with the lab results as soon as they are available. The fastest way to get your results is to activate your My Chart account. Instructions are located on the last page of this paperwork. If you have not heard from [name] regarding the results in 2 weeks, please contact this office.

## 2017-11-11 NOTE — Progress Notes (Signed)
Casey [name]  MRN: [id number] DOB: January 05, 1936  PCP: Sherren Mocha, MD  Subjective:  Pt is an 82 year old female who presents to clinic for chest pain x 1 week.  Pain is of her mid chest and is intermittent. Pain can get up to a 10 out of 10 in pain level. Radiates down her left arm, left shoulder blade and down the middle of her chest. Pain can last for 2 days.  She was moving furniture around 3 days ago and pain has been present since that time.  She has tried Tylenol - didn't help Denies lightheadedness, dizziness, chronic headache, double vision, chest pain, shortness of breath, heart racing, palpitations, nausea, vomiting, abdominal pain, hematuria, lower leg swelling.   Review of Systems  Cardiovascular: Positive for chest pain. Negative for palpitations and leg swelling.  Musculoskeletal: Positive for arthralgias and back pain. Negative for neck pain and neck stiffness.  Skin: Negative.     Patient Active Problem List   Diagnosis Date Noted  . Vitamin B12 deficiency 07/14/2017  . DJD (degenerative joint disease) of knee 07/07/2013  . Type 2 diabetes mellitus with microalbuminuria, without long-term current use of insulin (HCC) 05/26/2012  . Anemia 10/23/2011  . Encounter for medication monitoring 10/23/2011  . Hyponatremia 12/01/2010    Class: Acute  . Long Q-T syndrome 11/26/2010  . CYSTOCELE WITH INCOMPLETE UTERINE PROLAPSE 03/10/2010  . HYPERGLYCEMIA 02/06/2010  . ESOPHAGEAL STRICTURE 12/11/2009  . Obesity 12/09/2009  . PROBLEMS WITH SWALLOWING AND MASTICATION 12/09/2009  . MICROSCOPIC HEMATURIA 07/29/2009  . ARM PAIN, RIGHT 03/28/2009  . Fatigue 03/28/2009  . CONSTIPATION 09/25/2008  . Sinusitis, chronic 02/23/2008  . Osteoarthrosis involving lower leg 09/28/2007  . LEG EDEMA, BILATERAL 08/31/2007  . FLANK PAIN, LEFT 06/08/2007  . SPRAIN&STRAIN OTH SPEC SITES SHOULDER&UPPER ARM 06/08/2007  . BENIGN POSITIONAL VERTIGO 04/25/2007  . Headache 03/24/2007  .  HYPERCHOLESTEROLEMIA 03/03/2007  . Anxiety state 03/03/2007  . HYPERTENSION, BENIGN ESSENTIAL 03/03/2007  . Allergic rhinitis 03/03/2007  . HIP PAIN, LEFT 03/03/2007  . Osteoporosis 03/03/2007    Current Outpatient Medications on File Prior to Visit  Medication Sig Dispense Refill  . ALPRAZolam (XANAX) 0.25 MG tablet Take 1-2 tablets (0.25-0.5 mg total) by mouth at bedtime as needed for anxiety or sleep. 60 tablet 1  . amLODipine (NORVASC) 5 MG tablet Take 1 tablet (5 mg total) by mouth daily. 90 tablet 1  . amoxicillin (AMOXIL) 500 MG capsule TK ONE C PO  QID  0  . aspirin EC 81 MG tablet Take 1 tablet (81 mg total) by mouth daily.    Marland Kitchen azelastine (ASTELIN) 0.1 % nasal spray INSTILL 1 SPRAY INTO BOTH NOSTRILS TWICE DAILY AS DIRECTED 30 mL 0  . azelastine (OPTIVAR) 0.05 % ophthalmic solution INSTILL 1 DROP IN BOTH EYES TWICE DAILY 6 mL 0  . glucose blood test strip 1 each by Other route daily. Use as instructed 50 each 11  . HYDROcodone-acetaminophen (NORCO/VICODIN) 5-325 MG tablet Take 1 tablet by mouth every 6 (six) hours as needed for moderate pain. For chronic pain 120 tablet 0  . lisinopril (PRINIVIL,ZESTRIL) 40 MG tablet Take 1 tablet (40 mg total) by mouth daily. 90 tablet 1  . meclizine (ANTIVERT) 25 MG tablet Take 1 tablet (25 mg total) by mouth 3 (three) times daily. 270 tablet 1  . metFORMIN (GLUCOPHAGE) 500 MG tablet Take 1 tablet (500 mg total) by mouth 2 (two) times daily with a meal. 180 tablet 1  .  atorvastatin (LIPITOR) 40 MG tablet Take 1 tab by mouth twice a week (Patient not taking: Reported on 11/11/2017) 90 tablet 1  . bacitracin ophthalmic ointment   0  . Cholecalciferol (VITAMIN D3) 5000 units CAPS Take 1 capsule by mouth daily.    Marland Kitchen FLUAD 0.5 ML SUSY ADM 0.5ML IM UTD  0  . gabapentin (NEURONTIN) 600 MG tablet Take 1 tablet (600 mg total) by mouth 3 (three) times daily. 270 tablet 1  . hydrOXYzine (VISTARIL) 25 MG capsule Take 1 capsule (25 mg total) by mouth 3  (three) times daily as needed for anxiety. (Patient not taking: Reported on 11/11/2017) 30 capsule 2  . ipratropium (ATROVENT) 0.03 % nasal spray     . nortriptyline (PAMELOR) 50 MG capsule Take 1 capsule (50 mg total) by mouth at bedtime. (Patient not taking: Reported on 11/11/2017) 90 capsule 1  . omeprazole (PRILOSEC) 40 MG capsule Take 1 capsule (40 mg total) by mouth daily. (Patient not taking: Reported on 11/11/2017) 30 capsule 1  . propranolol ER (INDERAL LA) 60 MG 24 hr capsule Take 1 capsule (60 mg total) by mouth daily. (Patient not taking: Reported on 11/11/2017) 90 capsule 1  . tobramycin-dexamethasone (TOBRADEX) ophthalmic solution instill 1 drop into left eye four times a day if needed  0   No current facility-administered medications on file prior to visit.     No Known Allergies   Objective:  Blood pressure 120/74, pulse 81, temperature 98.1 F (36.7 C), temperature source Oral, resp. rate 16, height 5\' 2"  (1.575 m), weight 202 lb 3.2 oz (91.7 kg), SpO2 95 %.  Physical Exam  Constitutional: She appears well-developed and well-nourished. No distress.  Pulmonary/Chest: She exhibits tenderness.  TTP in areas indicated. Reproduction of symptoms with pt grasping my fingers and pushing forward and pulling back against resistance.     Skin: She is not diaphoretic.  Psychiatric: She has a normal mood and affect. Thought content normal.  Vitals reviewed.  Results for orders placed or performed in visit on 11/11/17  POCT urinalysis dipstick  Result Value Ref Range   Color, UA yellow yellow   Clarity, UA clear clear   Glucose, UA negative negative mg/dL   Bilirubin, UA negative negative   Ketones, POC UA negative negative mg/dL   Spec Grav, UA 1.308 6.578 - 1.025   Blood, UA negative negative   pH, UA 7.5 5.0 - 8.0   Protein Ur, POC trace (A) negative mg/dL   Urobilinogen, UA 0.2 0.2 or 1.0 E.U./dL   Nitrite, UA Negative Negative   Leukocytes, UA Negative Negative     EKG: NSR, no acute ischemic changes noted. No significant change noted when compared to prior EKG done 12/30/2016.   I have personally reviewed the EKG tracing and agree with the computer interpretation: Sinus Rhythm -Left atrial enlargement.  - Diffuse nonspecific T-abnormality  ABNORMAL  Assessment and Plan :  1. Chest wall pain - Pt presents c/o intermittent chest pain x 3 days s/p moving furniture. NAD, vitals are stable. EKG is unchanged from last EKG done 12/30/2016. UA negative. Her pain is reproducible on PE. Low suspicion for cardiac in nature. Suspect MSK pain, plan to treat with voltaren gel and heat. RTC in 3-5 days if no improvement. Emergency department precautions discussed. She understands and agrees with plan.  - diclofenac sodium (VOLTAREN) 1 % GEL; Apply 2-4 g topically 4 (four) times daily.  Dispense: 100 g; Refill: 1  2. Chest pain in adult -  Recheck vitals - POCT urinalysis dipstick - EKG 12-Lead    Marco Collie, PA-C  Primary Care at [facility] 11/11/2017 11:38 AM  Please note: Portions of this report may have been transcribed using dragon voice recognition software. Every effort was made to ensure accuracy; however, inadvertent computerized transcription errors may be present.

## 2017-11-13 ENCOUNTER — Other Ambulatory Visit: Payer: Self-pay | Admitting: Family Medicine

## 2017-11-15 NOTE — Telephone Encounter (Signed)
Requested Prescriptions  Pending Prescriptions Disp Refills  . azelastine (OPTIVAR) 0.05 % ophthalmic solution [Pharmacy Med Name: AZELASTINE 0.05% OPHTH SOLUTION 6ML] 6 mL 0    Sig: INSTILL 1 DROP IN BOTH EYES TWICE DAILY     Ophthalmology:  Antiallergy Passed - 11/13/2017  6:05 AM      Passed - Valid encounter within last 12 months    Recent Outpatient Visits          4 days ago Chest wall pain   Primary Care at [facility], Madelaine Bhat, PA-C   1 month ago Type 2 diabetes mellitus with microalbuminuria, without long-term current use of insulin [facility])   Primary Care at [facility], Madelaine Bhat, PA-C   4 months ago Type 2 diabetes mellitus with microalbuminuria, without long-term current use of insulin [facility])   [facility] at [facility], Levell July, MD   7 months ago Type 2 diabetes mellitus with microalbuminuria, without long-term current use of insulin [facility])   [facility] at [facility], Levell July, MD   9 months ago Microscopic hematuria   [facility] at [facility], Levell July, MD      Future Appointments            In 1 month Casey Mocha, MD Primary Care at [place], [facility]         . azelastine (ASTELIN) 0.1 % nasal spray [Pharmacy Med Name: AZELASTINE 0.1%(137MCG) NASAL-200SP] 30 mL 0    Sig: INSTILL 1 SPRAY INTO BOTH NOSTRILS TWICE DAILY AS DIRECTED     Ear, Nose, and Throat: Nasal Preparations - Antiallergy Passed - 11/13/2017  6:05 AM      Passed - Valid encounter within last 12 months    Recent Outpatient Visits          4 days ago Chest wall pain   Primary Care at [facility], Madelaine Bhat, PA-C   1 month ago Type 2 diabetes mellitus with microalbuminuria, without long-term current use of insulin [facility])   Primary Care at [facility] Madelaine Bhat, PA-C   4 months ago Type 2 diabetes mellitus with microalbuminuria, without long-term current use of insulin [facility])   [facility] at [facility], Levell July, MD   7 months ago Type 2 diabetes mellitus with  microalbuminuria, without long-term current use of insulin [facility])   [facility] at [facility], Levell July, MD   9 months ago Microscopic hematuria   [facility] at [facility], Levell July, MD      Future Appointments            In 1 month Casey Croft Levell July, MD Primary Care at [place], [facility]

## 2017-11-18 ENCOUNTER — Ambulatory Visit
Admission: RE | Admit: 2017-11-18 | Discharge: 2017-11-18 | Disposition: A | Discharge status: Home / Self Care | Payer: Medicare Other | Source: Ambulatory Visit | Attending: Family Medicine | Admitting: Family Medicine

## 2017-11-18 DIAGNOSIS — Z1231 Encounter for screening mammogram for malignant neoplasm of breast: Secondary | ICD-10-CM

## 2017-11-20 NOTE — Progress Notes (Signed)
Subjective: Casey [name] presents today for follow up of painful, mycotic toenails. She also has h/o neuropathy for which she takes gabapentin.    She voices no new problems on today's visit.  Objective:  Neurovascular status unchanged since last visit.  Dermatological Examination: Skin with normal turgor, texture and tone b/l Toenails 1-5 b/l discolored, thick, dystrophic with subungual debris and pain with palpation to nailbeds due to thickness of nails.  No interdigital macerations.  No wounds.  Musculoskeletal: Muscle strength 5/5 to all LE muscle groups  Assessment: Painful onychomycosis toenails 1-5 b/l   Plan: 1. Toenails 1-5 b/l were debrided in length and girth without iatrogenic bleeding. 2. Patient to continue soft, supportive shoe gear 3. Patient to report any pedal injuries to medical professional immediately. 4. Follow up 3 months. Patient/POA to call should there be a concern in the interim.

## 2017-12-03 ENCOUNTER — Other Ambulatory Visit: Payer: Self-pay | Admitting: Family Medicine

## 2017-12-03 DIAGNOSIS — M170 Bilateral primary osteoarthritis of knee: Secondary | ICD-10-CM

## 2017-12-03 NOTE — Telephone Encounter (Signed)
Requested medication (s) are due for refill today: yes  Requested medication (s) are on the active medication list: yes  Last refill:  10/12/17  Future visit scheduled: yes  Notes to clinic:  Controlled substance   Requested Prescriptions  Pending Prescriptions Disp Refills   HYDROcodone-acetaminophen (NORCO/VICODIN) 5-325 MG tablet 120 tablet 0    Sig: Take 1 tablet by mouth every 6 (six) hours as needed for moderate pain. For chronic pain     Not Delegated - Analgesics:  Opioid Agonist Combinations Failed - 12/03/2017  1:29 PM      Failed - This refill cannot be delegated      Failed - Urine Drug Screen completed in last 360 days.      Passed - Valid encounter within last 6 months    Recent Outpatient Visits          3 weeks ago Chest wall pain   [facility], Madelaine BhatElizabeth Whitney, PA-C   1 month ago Type 2 diabetes mellitus with microalbuminuria, without long-term current use of insulin [facility](HCC)   [facility], Madelaine BhatElizabeth Whitney, PA-C   5 months ago Type 2 diabetes mellitus with microalbuminuria, without long-term current use of insulin [facility](HCC)   [facility], Levell JulyEva N, MD   8 months ago Type 2 diabetes mellitus with microalbuminuria, without long-term current use of insulin [facility](HCC)   [facility], Levell JulyEva N, MD   9 months ago Microscopic hematuria   [facility], Levell JulyEva N, MD      Future Appointments            In 1 month Sherren MochaShaw, Eva N, MD [facility] [place], [facility]

## 2017-12-03 NOTE — Telephone Encounter (Signed)
Copied from CRM [id number]. Topic: Quick Communication - See Telephone Encounter >> Dec 03, 2017 12:06 PM Casey AmatoBurton, Donna F wrote: Pt is calling to get a refill on hydrocodone  [facility] [address]   Best number [phone]

## 2017-12-08 MED ORDER — HYDROCODONE-ACETAMINOPHEN 5-325 MG PO TABS: 1.0000 | ORAL_TABLET | Freq: Four times a day (QID) | ORAL | 0 refills | Status: DC | PRN

## 2017-12-08 NOTE — Telephone Encounter (Signed)
Pt calling to check status on her med refill. 

## 2017-12-08 NOTE — Telephone Encounter (Signed)
Today I have utilized the [place] Controlled Substance Registry's online query to confirm compliance regarding the patient's controlled medications. My review reveals appropriate prescription fills and that I am the sole provider of these medications. Rechecks will occur regularly and the patient is aware of our use of the system.  Last filled hydrocodone #120 on 10/12/17 from [facility]. Also filled alprazolam from [facility] on 9/24 w/ refill on 10/29. Sent in requested refill for hydrocodone.  Meds ordered this encounter  Medications  . HYDROcodone-acetaminophen (NORCO/VICODIN) 5-325 MG tablet    Sig: Take 1 tablet by mouth every 6 (six) hours as needed for moderate pain. For chronic pain    Dispense:  120 tablet    Refill:  0

## 2017-12-13 ENCOUNTER — Other Ambulatory Visit: Payer: Self-pay | Admitting: Family Medicine

## 2017-12-14 NOTE — Telephone Encounter (Signed)
Requested medication (s) are due for refill today: yes  Requested medication (s) are on the active medication list: yes  Last refill:  Both last refilled on 11/15/17  Future visit scheduled: yes  Notes to clinic:  none  Requested Prescriptions  Pending Prescriptions Disp Refills   azelastine (OPTIVAR) 0.05 % ophthalmic solution [Pharmacy Med Name: AZELASTINE 0.05% OPHTH SOLUTION 6ML] 6 mL 0    Sig: INSTILL 1 DROP IN BOTH EYES TWICE DAILY     Ophthalmology:  Antiallergy Passed - 12/14/2017  7:25 AM      Passed - Valid encounter within last 12 months    Recent Outpatient Visits          1 month ago Chest wall pain   [facility], Madelaine BhatElizabeth Whitney, PA-C   2 months ago Type 2 diabetes mellitus with microalbuminuria, without long-term current use of insulin [facility](HCC)   [facility], Madelaine BhatElizabeth Whitney, PA-C   5 months ago Type 2 diabetes mellitus with microalbuminuria, without long-term current use of insulin [facility](HCC)   [facility], Levell JulyEva N, MD   8 months ago Type 2 diabetes mellitus with microalbuminuria, without long-term current use of insulin [facility](HCC)   [facility], Levell JulyEva N, MD   10 months ago Microscopic hematuria   [facility], Levell JulyEva N, MD      Future Appointments            In 2 weeks Sherren MochaShaw, Eva N, MD Primary Care at [place], [facility]          azelastine (ASTELIN) 0.1 % nasal spray [Pharmacy Med Name: AZELASTINE 0.1%(137MCG) NASAL-200SP] 30 mL 0    Sig: INSTILL 1 SPRAY INTO BOTH NOSTRILS TWICE DAILY AS DIRECTED     Ear, Nose, and Throat: Nasal Preparations - Antiallergy Passed - 12/14/2017  7:25 AM      Passed - Valid encounter within last 12 months    Recent Outpatient Visits          1 month ago Chest wall pain   [facility], Madelaine BhatElizabeth Whitney, PA-C   2 months ago Type 2 diabetes mellitus with microalbuminuria, without long-term current use of insulin [facility](HCC)   [facility],  Madelaine BhatElizabeth Whitney, PA-C   5 months ago Type 2 diabetes mellitus with microalbuminuria, without long-term current use of insulin [facility](HCC)   [facility], Levell JulyEva N, MD   8 months ago Type 2 diabetes mellitus with microalbuminuria, without long-term current use of insulin [facility](HCC)   [facility], Levell JulyEva N, MD   10 months ago Microscopic hematuria   [facility], Levell JulyEva N, MD      Future Appointments            In 2 weeks Sherren MochaShaw, Eva N, MD [facility] [place]

## 2017-12-28 ENCOUNTER — Other Ambulatory Visit: Payer: Self-pay | Admitting: Physician Assistant

## 2017-12-28 DIAGNOSIS — F411 Generalized anxiety disorder: Secondary | ICD-10-CM

## 2017-12-28 NOTE — Telephone Encounter (Signed)
Please advise 

## 2018-01-02 NOTE — Telephone Encounter (Signed)
Today I have utilized the [place] Controlled Substance Registry's online query to confirm compliance regarding the patient's controlled medications. My review reveals appropriate prescription fills and that I am the sole provider of these medications. Rechecks will occur regularly and the patient is aware of our use of the system.  Pt has appt tomorrow 12/16 so will send in 1 refill Last filled rx written 9/24 on 9/24 and 10/29  Filled hydrocodone 5mg  #120 on 9/24 and 11/21

## 2018-01-03 ENCOUNTER — Encounter: Payer: Self-pay | Admitting: Family Medicine

## 2018-01-03 ENCOUNTER — Other Ambulatory Visit: Payer: Self-pay

## 2018-01-03 ENCOUNTER — Ambulatory Visit (INDEPENDENT_AMBULATORY_CARE_PROVIDER_SITE_OTHER): Payer: Medicare Other | Admitting: Family Medicine

## 2018-01-03 VITALS — BP 138/73 | HR 85 | Temp 98.0°F | Resp 16 | Ht 63.39 in | Wt 204.0 lb

## 2018-01-03 DIAGNOSIS — R1310 Dysphagia, unspecified: Secondary | ICD-10-CM | POA: Diagnosis not present

## 2018-01-03 DIAGNOSIS — Z5181 Encounter for therapeutic drug level monitoring: Secondary | ICD-10-CM

## 2018-01-03 DIAGNOSIS — E1129 Type 2 diabetes mellitus with other diabetic kidney complication: Secondary | ICD-10-CM | POA: Diagnosis not present

## 2018-01-03 DIAGNOSIS — M810 Age-related osteoporosis without current pathological fracture: Secondary | ICD-10-CM

## 2018-01-03 DIAGNOSIS — M1710 Unilateral primary osteoarthritis, unspecified knee: Secondary | ICD-10-CM

## 2018-01-03 DIAGNOSIS — H5712 Ocular pain, left eye: Secondary | ICD-10-CM

## 2018-01-03 DIAGNOSIS — M170 Bilateral primary osteoarthritis of knee: Secondary | ICD-10-CM

## 2018-01-03 DIAGNOSIS — R809 Proteinuria, unspecified: Secondary | ICD-10-CM | POA: Diagnosis not present

## 2018-01-03 DIAGNOSIS — IMO0002 Reserved for concepts with insufficient information to code with codable children: Secondary | ICD-10-CM

## 2018-01-03 DIAGNOSIS — E538 Deficiency of other specified B group vitamins: Secondary | ICD-10-CM | POA: Diagnosis not present

## 2018-01-03 LAB — POCT GLYCOSYLATED HEMOGLOBIN (HGB A1C): Hemoglobin A1C: 6.6 % — AB (ref 4.0–5.6)

## 2018-01-03 LAB — POCT URINALYSIS DIP (MANUAL ENTRY)
Bilirubin, UA: NEGATIVE
Blood, UA: NEGATIVE
Glucose, UA: NEGATIVE mg/dL
Ketones, POC UA: NEGATIVE mg/dL
Leukocytes, UA: NEGATIVE
Nitrite, UA: NEGATIVE
Protein Ur, POC: 100 mg/dL — AB
Spec Grav, UA: 1.015 (ref 1.010–1.025)
Urobilinogen, UA: 0.2 E.U./dL
pH, UA: 7.5 (ref 5.0–8.0)

## 2018-01-03 MED ORDER — ZOSTER VAC RECOMB ADJUVANTED 50 MCG/0.5ML IM SUSR: 0.5000 mL | Freq: Once | INTRAMUSCULAR | 1 refills | Status: AC

## 2018-01-03 MED ORDER — METHYLPREDNISOLONE ACETATE 80 MG/ML IJ SUSP: 40.0000 mg | Freq: Once | INTRAMUSCULAR | Status: DC

## 2018-01-03 NOTE — Patient Instructions (Addendum)
  If you have lab work done today you will be contacted with your lab results within the next 2 weeks.  If you have not heard from us then please contact us. The fastest way to get your results is to register for My Chart.   IF you received an x-ray today, you will receive an invoice from [facility]. Please contact [facility] at [phone] with questions or concerns regarding your invoice.   IF you received labwork today, you will receive an invoice from [facility]. Please contact [facility] at [phone] with questions or concerns regarding your invoice.   Our billing staff will not be able to assist you with questions regarding bills from these companies.  You will be contacted with the lab results as soon as they are available. The fastest way to get your results is to activate your My Chart account. Instructions are located on the last page of this paperwork. If you have not heard from us regarding the results in 2 weeks, please contact this office.    You need to schedule your DEXA bone scan.  Please call [facility] at [phone] to schedule.  You are deficient on vitamin B12 - this could be due to lifestyle/dietary choices, medications (such as metformin), or lacking the enzyme which allows vitamin B12 to be absorbed through the GI tract. Vitamin B12 deficiency can cause anemia, extremity numbness, pain, severe lethargy, fatigue, confusion, and problems with memory in addition to many other symptoms and concerns. Please start a [phone] mcg vitamin B12 supplement in a liquid or a dissolvable tablet form that can be placed under your tongue for absorption (rather than swallowed in a multivitamin) once a day.  At your next visit, we will recheck your level to see if you have been able to sufficiently replace your B12 deficiency through this supplementation or if you are one of the rare individuals who may be dependent on regular B12 injections to  maintain a sufficient level.   A new shingles vaccine called Shingrix (recombinant zoster vaccine) was licensed by the U.S. Food and Drug Administration (FDA) in 2017. CDC recommends that healthy adults 50 years and older get two doses of Shingrix, 2 to 6 months apart. Shingrix provides strong protection against shingles and post-herpetic neuralgia. Shingrix is now the CDC preferred herpes zoster vaccine, over Zostavax, a shingles vaccine in use since 2006.  Re-vaccination with both doses of Shingrix is recommended for individuals who have received the Zostavax vaccine prior. However, you should not receive any type of shingles vaccine if you do not have a known history of chicken pox infection or vaccination. (If there is a question as to your history, a confirmatory blood test can be obtained at our office.) Therefore, I do recommend that you receive the Shingrix vaccine but we do not supply this at our office so I have sent a prescription to your pharmacy so that the vaccine can be administered there.  Though the prescription will stay active for a year, I recommend you get this done ASAP before they run out of stock and get your second dose as soon as possible in 2 months. It is possible to have flu like symptoms for several days after.

## 2018-01-03 NOTE — Progress Notes (Signed)
Subjective:    Patient: Casey [name]  DOB: [birth year]; 82 y.o.   MRN: [id number]  Chief Complaint  Patient presents with  . Diabetes    3 month recheck     HPI  DM:  [facility]  HLD: had pt restart atorvastatin 40 2x/wk ~ 6 mos prior - did NOT restart as she did not have any at home. Lab Results  Component Value Date   LDLCALC 162 (H) 07/01/2017   LDLCALC 157 (H) 12/25/2016   LDLCALC 139 (H) 12/24/2015    Low vitamin B12: 07/01/17 t 310 (nml [phone]) so rec to start oral supp -she started a mvi - geritol which has 6.6 mcg vitamin B12  Anxiety:  Dependent upon daily alprazolam - tried to have pt come off due to narcotic use and she was a basket case for mos - couldn't go to church even - people asking her what was wrong- other meds didn't work, so restarted at lowest dose poss  Chronic pain syndrome: UTox 03/29/17 appropriate for rx'd hydrocodone and alprazolam. Has been using progressively increasing doses of hydrocodone over the past sev yrs due to worsening knee pain  Has had knee injections prior which helped.  Going to get an operation on her knees in the spring.   Takes meclizine   Constipation  Medical History Past Medical History:  Diagnosis Date  . ALLERGIC RHINITIS   . Allergy   . ANXIETY   . ARM PAIN, RIGHT   . BENIGN POSITIONAL VERTIGO   . Carcinoma in situ of colon   . CONSTIPATION   . CYSTOCELE WITH INCOMPLETE UTERINE PROLAPSE   . ESOPHAGEAL STRICTURE   . FATIGUE   . FLANK PAIN, LEFT   . Headache(784.0)   . HIP PAIN, LEFT   . HYPERCHOLESTEROLEMIA   . HYPERGLYCEMIA   . HYPERTENSION, BENIGN ESSENTIAL 03/03/2007  . LEG EDEMA, BILATERAL   . Microscopic hematuria   . OBESITY   . OSTEOARTHRITIS, KNEE   . OSTEOPOROSIS   . Peripheral neuropathy, idiopathic   . Problems with swallowing and mastication   . SINUSITIS, RECURRENT   . SPRAIN&STRAIN OTH SPEC SITES SHOULDER&UPPER ARM    Past Surgical History:  Procedure  Laterality Date  . LEFT HEART CATHETERIZATION WITH CORONARY ANGIOGRAM N/A 12/02/2010   Procedure: LEFT HEART CATHETERIZATION WITH CORONARY ANGIOGRAM;  Surgeon: Peter M SwazilandJordan, MD;  [facility];  Service: Cardiovascular;  Laterality: N/A;   Current Outpatient Medications on File Prior to Visit  Medication Sig Dispense Refill  . ALPRAZolam (XANAX) 0.25 MG tablet Take 1-2 tablets (0.25-0.5 mg total) by mouth at bedtime as needed for anxiety. **NEED VISIT FOR ANY REFILLS** 60 tablet 0  . amLODipine (NORVASC) 5 MG tablet Take 1 tablet (5 mg total) by mouth daily. 90 tablet 1  . amoxicillin (AMOXIL) 500 MG capsule TK ONE C PO  QID  0  . aspirin EC 81 MG tablet Take 1 tablet (81 mg total) by mouth daily.    Marland Kitchen. atorvastatin (LIPITOR) 40 MG tablet Take 1 tab by mouth twice a week (Patient not taking: Reported on 11/11/2017) 90 tablet 1  . azelastine (ASTELIN) 0.1 % nasal spray INSTILL 1 SPRAY INTO BOTH NOSTRILS TWICE DAILY AS DIRECTED 30 mL 0  . azelastine (OPTIVAR) 0.05 % ophthalmic solution INSTILL 1 DROP IN BOTH EYES TWICE DAILY 6 mL 0  . bacitracin ophthalmic ointment   0  . Cholecalciferol (VITAMIN D3) 5000 units  CAPS Take 1 capsule by mouth daily.    . diclofenac sodium (VOLTAREN) 1 % GEL Apply 2-4 g topically 4 (four) times daily. 100 g 1  . FLUAD 0.5 ML SUSY ADM 0.5ML IM UTD  0  . gabapentin (NEURONTIN) 600 MG tablet Take 1 tablet (600 mg total) by mouth 3 (three) times daily. 270 tablet 1  . glucose blood test strip 1 each by Other route daily. Use as instructed 50 each 11  . HYDROcodone-acetaminophen (NORCO/VICODIN) 5-325 MG tablet Take 1 tablet by mouth every 6 (six) hours as needed for moderate pain. For chronic pain 120 tablet 0  . hydrOXYzine (VISTARIL) 25 MG capsule Take 1 capsule (25 mg total) by mouth 3 (three) times daily as needed for anxiety. (Patient not taking: Reported on 11/11/2017) 30 capsule 2  . ipratropium (ATROVENT) 0.03 % nasal spray     . lisinopril  (PRINIVIL,ZESTRIL) 40 MG tablet Take 1 tablet (40 mg total) by mouth daily. 90 tablet 1  . meclizine (ANTIVERT) 25 MG tablet Take 1 tablet (25 mg total) by mouth 3 (three) times daily. 270 tablet 1  . metFORMIN (GLUCOPHAGE) 500 MG tablet Take 1 tablet (500 mg total) by mouth 2 (two) times daily with a meal. 180 tablet 1  . nortriptyline (PAMELOR) 50 MG capsule Take 1 capsule (50 mg total) by mouth at bedtime. (Patient not taking: Reported on 11/11/2017) 90 capsule 1  . omeprazole (PRILOSEC) 40 MG capsule Take 1 capsule (40 mg total) by mouth daily. (Patient not taking: Reported on 11/11/2017) 30 capsule 1  . propranolol ER (INDERAL LA) 60 MG 24 hr capsule Take 1 capsule (60 mg total) by mouth daily. (Patient not taking: Reported on 11/11/2017) 90 capsule 1  . tobramycin-dexamethasone (TOBRADEX) ophthalmic solution instill 1 drop into left eye four times a day if needed  0   No current facility-administered medications on file prior to visit.    No Known Allergies Family History  Problem Relation Age of Onset  . Coronary artery disease Mother 64  . Cancer Father 78  . Heart disease Father   . Colon cancer Unknown   . Diabetes Unknown   . Coronary artery disease Sister   . Diabetes type II Sister   . Cancer Sister   . Coronary artery disease Brother   . Kidney disease Brother   . Coronary artery disease Brother   . Breast cancer Neg Hx    Social History   Socioeconomic History  . Marital status: Single    Spouse name: Not on file  . Number of children: 1  . Years of education: 11  . Highest education level: High school graduate  Occupational History  . Not on file  Social Needs  . Financial resource strain: Not very hard  . Food insecurity:    Worry: Never true    Inability: Never true  . Transportation needs:    Medical: No    Non-medical: No  Tobacco Use  . Smoking status: Never Smoker  . Smokeless tobacco: Never Used  Substance and Sexual Activity  . Alcohol use: No    . Drug use: No  . Sexual activity: Not Currently  Lifestyle  . Physical activity:    Days per week: 0 days    Minutes per session: 0 min  . Stress: Only a little  Relationships  . Social connections:    Talks on phone: Once a week    Gets together: Once a week    Attends  religious service: More than 4 times per year    Active member of club or organization: No    Attends meetings of clubs or organizations: Never    Relationship status: Never married  Other Topics Concern  . Not on file  Social History Narrative  . Not on file   Depression screen [facility] 2/9 11/11/2017 07/01/2017 03/29/2017 12/30/2016 12/25/2016  Decreased Interest 0 0 0 2 0  Down, Depressed, Hopeless 0 0 0 1 1  PHQ - 2 Score 0 0 0 3 1  Altered sleeping - - - 2 -  Tired, decreased energy - - - 3 -  Change in appetite - - - 0 -  Feeling bad or failure about yourself  - - - 1 -  Trouble concentrating - - - 0 -  Moving slowly or fidgety/restless - - - 1 -  Suicidal thoughts - - - 1 -  PHQ-9 Score - - - 11 -  Difficult doing work/chores - - - Not difficult at all -    ROS As noted in HPI  Objective:  BP 138/73   Pulse 85   Temp 98 F (36.7 C) (Oral)   Resp 16   Ht 5' 3.39" (1.61 m)   Wt 204 lb (92.5 kg)   SpO2 95%   BMI 35.70 kg/m  Physical Exam Constitutional:      General: She is not in acute distress.    Appearance: She is well-developed. She is not diaphoretic.  HENT:     Head: Normocephalic and atraumatic.     Right Ear: External ear normal.     Left Ear: External ear normal.  Eyes:     General: No scleral icterus.    Conjunctiva/sclera: Conjunctivae normal.  Neck:     Musculoskeletal: Normal range of motion and neck supple.     Thyroid: No thyromegaly.  Cardiovascular:     Rate and Rhythm: Normal rate and regular rhythm.     Heart sounds: Normal heart sounds.  Pulmonary:     Effort: Pulmonary effort is normal. No respiratory distress.     Breath sounds: Normal breath sounds.   Lymphadenopathy:     Cervical: No cervical adenopathy.  Skin:    General: Skin is warm and dry.     Findings: No erythema.  Neurological:     Mental Status: She is alert and oriented to person, place, and time.  Psychiatric:        Behavior: Behavior normal.    Risks/benefits of injection reviewed and informed consent obtained.  Knees positioned at 10 deg flexion, cleaned with EtOH x3 bilaterally  Anesthesia w/ ethyl chloride cold spray. In superior lateral approach and injected with 40mg  of DepoMedrol and 5cc of 1% lidocaine EACH using 21g 1 1/2in needle to BILATERAL suprapatellar bursa without complications. Pt tolerated procedure well. No EBL.  POC TESTING Office Visit on 01/03/2018  Component Date Value Ref Range Status  . Hemoglobin A1C 01/03/2018 6.6* 4.0 - 5.6 % Final  . Color, UA 01/03/2018 yellow  yellow Final  . Clarity, UA 01/03/2018 clear  clear Final  . Glucose, UA 01/03/2018 negative  negative mg/dL Final  . Bilirubin, UA 01/03/2018 negative  negative Final  . Ketones, POC UA 01/03/2018 negative  negative mg/dL Final  . Spec Grav, UA 01/03/2018 1.015  1.010 - 1.025 Final  . Blood, UA 01/03/2018 negative  negative Final  . pH, UA 01/03/2018 7.5  5.0 - 8.0 Final  . Protein Ur, POC 01/03/2018 =100*  negative mg/dL Final  . Urobilinogen, UA 01/03/2018 0.2  0.2 or 1.0 E.U./dL Final  . Nitrite, UA 16/10/9602 Negative  Negative Final  . Leukocytes, UA 01/03/2018 Negative  Negative Final    Nml tsh 07/01/17 Assessment & Plan:   1. Type 2 diabetes mellitus with microalbuminuria, without long-term current use of insulin (HCC)   2. Problems with swallowing and mastication   3. Vitamin B12 deficiency   4. Encounter for medication monitoring   5. Age-related osteoporosis without current pathological fracture   6. Primary osteoarthritis of both knees   7. Osteoarthrosis involving lower leg   8. Left eye pain    Patient will continue on current chronic medications other than  changes noted above, so ok to refill when needed.   See after visit summary for patient specific instructions.  Orders Placed This Encounter  Procedures  . DG Bone Density    Standing Status:   Future    Standing Expiration Date:   03/07/2019    Order Specific Question:   Reason for Exam (SYMPTOM  OR DIAGNOSIS REQUIRED)    Answer:   estrogen deficiency    Order Specific Question:   Preferred imaging location?    Answer:   [facility]  . Comprehensive metabolic panel    Order Specific Question:   Has the patient fasted?    Answer:   Yes  . Vitamin B12  . CBC with Differential/Platelet  . Lipid panel    Order Specific Question:   Has the patient fasted?    Answer:   Yes  . Microalbumin / creatinine urine ratio  . Ambulatory referral to Orthopedic Surgery    Referral Priority:   Routine    Referral Type:   Surgical    Referral Reason:   Specialty Services Required    Requested Specialty:   Orthopedic Surgery    Number of Visits Requested:   1  . Ambulatory referral to Ophthalmology    Referral Priority:   Routine    Referral Type:   Consultation    Referral Reason:   Specialty Services Required    Requested Specialty:   Ophthalmology    Number of Visits Requested:   1  . POCT glycosylated hemoglobin (Hb A1C)  . POCT urinalysis dipstick    Meds ordered this encounter  Medications  . Zoster Vaccine Adjuvanted [facility]) injection    Sig: Inject 0.5 mLs into the muscle once for 1 dose. Repeat once in 2 to 6 months    Dispense:  1 each    Refill:  1  . methylPREDNISolone acetate (DEPO-MEDROL) injection 40 mg  . methylPREDNISolone acetate (DEPO-MEDROL) injection 40 mg    Patient verbalized to me that they understand the following: diagnosis, what is being done for them, what to expect and what should be done at home.  Their questions have been answered. They understand that I am unable to predict every possible medication interaction or adverse outcome and that if any  unexpected symptoms arise, they should contact us and their pharmacist, as well as never hesitate to seek urgent/emergent care at [facility] or ER if they think it might be warranted.    Today I have utilized the [place] Controlled Substance Registry's online query to confirm compliance regarding the patient's controlled medications. My review reveals appropriate prescription fills and that I am the sole provider of these medications. Rechecks will occur regularly and the patient is aware of our use of the system.  [name], MD,  [facility] [address] [place], [place]  [address] [phone] Office phone  [phone] Office fax  01/03/18 3:30 AM

## 2018-01-04 LAB — COMPREHENSIVE METABOLIC PANEL
A/G RATIO: 1.4 (ref 1.2–2.2)
ALBUMIN: 4.2 g/dL (ref 3.5–4.7)
ALT: 15 IU/L (ref 0–32)
AST: 19 IU/L (ref 0–40)
Alkaline Phosphatase: 81 IU/L (ref 39–117)
BUN / CREAT RATIO: 12 (ref 12–28)
BUN: 11 mg/dL (ref 8–27)
Bilirubin Total: 0.2 mg/dL (ref 0.0–1.2)
CALCIUM: 9.7 mg/dL (ref 8.7–10.3)
CO2: 24 mmol/L (ref 20–29)
CREATININE: 0.91 mg/dL (ref 0.57–1.00)
Chloride: 100 mmol/L (ref 96–106)
GFR, EST AFRICAN AMERICAN: 69 mL/min/{1.73_m2} (ref >59)
GFR, EST NON AFRICAN AMERICAN: 60 mL/min/{1.73_m2} (ref >59)
GLOBULIN, TOTAL: 2.9 g/dL (ref 1.5–4.5)
Glucose: 106 mg/dL — ABNORMAL HIGH (ref 65–99)
POTASSIUM: 4.5 mmol/L (ref 3.5–5.2)
SODIUM: 137 mmol/L (ref 134–144)
TOTAL PROTEIN: 7.1 g/dL (ref 6.0–8.5)

## 2018-01-04 LAB — MICROALBUMIN / CREATININE URINE RATIO
Creatinine, Urine: 118.4 mg/dL
MICROALB/CREAT RATIO: 135.4 mg/g{creat} — AB (ref 0.0–30.0)
Microalbumin, Urine: 160.3 ug/mL

## 2018-01-04 LAB — VITAMIN B12: VITAMIN B 12: 468 pg/mL (ref 232–1245)

## 2018-01-04 LAB — CBC WITH DIFFERENTIAL/PLATELET
Basophils Absolute: 0.1 10*3/uL (ref 0.0–0.2)
Basos: 1 %
EOS (ABSOLUTE): 0.2 10*3/uL (ref 0.0–0.4)
Eos: 3 %
Hematocrit: 31.8 % — ABNORMAL LOW (ref 34.0–46.6)
Hemoglobin: 10.7 g/dL — ABNORMAL LOW (ref 11.1–15.9)
Immature Grans (Abs): 0 10*3/uL (ref 0.0–0.1)
Immature Granulocytes: 0 %
Lymphocytes Absolute: 2.6 10*3/uL (ref 0.7–3.1)
Lymphs: 41 %
MCH: 28.3 pg (ref 26.6–33.0)
MCHC: 33.6 g/dL (ref 31.5–35.7)
MCV: 84 fL (ref 79–97)
MONOS ABS: 0.5 10*3/uL (ref 0.1–0.9)
Monocytes: 8 %
Neutrophils Absolute: 3.1 10*3/uL (ref 1.4–7.0)
Neutrophils: 47 %
Platelets: 296 10*3/uL (ref 150–450)
RBC: 3.78 x10E6/uL (ref 3.77–5.28)
RDW: 14.2 % (ref 12.3–15.4)
WBC: 6.5 10*3/uL (ref 3.4–10.8)

## 2018-01-04 LAB — LIPID PANEL
Chol/HDL Ratio: 2.5 ratio (ref 0.0–4.4)
Cholesterol, Total: 219 mg/dL — ABNORMAL HIGH (ref 100–199)
HDL: 87 mg/dL (ref >39)
LDL Calculated: 123 mg/dL — ABNORMAL HIGH (ref 0–99)
Triglycerides: 43 mg/dL (ref 0–149)
VLDL Cholesterol Cal: 9 mg/dL (ref 5–40)

## 2018-01-12 ENCOUNTER — Other Ambulatory Visit: Payer: Self-pay | Admitting: Physician Assistant

## 2018-01-12 ENCOUNTER — Other Ambulatory Visit: Payer: Self-pay | Admitting: Family Medicine

## 2018-01-12 DIAGNOSIS — E1129 Type 2 diabetes mellitus with other diabetic kidney complication: Secondary | ICD-10-CM

## 2018-01-12 DIAGNOSIS — R809 Proteinuria, unspecified: Secondary | ICD-10-CM

## 2018-01-12 DIAGNOSIS — G609 Hereditary and idiopathic neuropathy, unspecified: Secondary | ICD-10-CM

## 2018-01-17 ENCOUNTER — Ambulatory Visit (INDEPENDENT_AMBULATORY_CARE_PROVIDER_SITE_OTHER): Payer: Medicare Other | Admitting: Orthopaedic Surgery

## 2018-01-17 ENCOUNTER — Ambulatory Visit (INDEPENDENT_AMBULATORY_CARE_PROVIDER_SITE_OTHER): Payer: Medicare Other

## 2018-01-17 ENCOUNTER — Encounter (INDEPENDENT_AMBULATORY_CARE_PROVIDER_SITE_OTHER): Payer: Self-pay | Admitting: Orthopaedic Surgery

## 2018-01-17 ENCOUNTER — Ambulatory Visit (INDEPENDENT_AMBULATORY_CARE_PROVIDER_SITE_OTHER): Payer: Self-pay

## 2018-01-17 VITALS — BP 154/80 | HR 82 | Ht 62.0 in | Wt 203.0 lb

## 2018-01-17 DIAGNOSIS — M25561 Pain in right knee: Secondary | ICD-10-CM | POA: Diagnosis not present

## 2018-01-17 DIAGNOSIS — M25562 Pain in left knee: Secondary | ICD-10-CM | POA: Diagnosis not present

## 2018-01-17 DIAGNOSIS — G8929 Other chronic pain: Secondary | ICD-10-CM

## 2018-01-17 NOTE — Progress Notes (Signed)
Office Visit Note   Patient: Casey [name]           Date of Birth: 29-Dec-1935           MRN: [id number] Visit Date: 01/17/2018              Requested by: Sherren Mocha, MD [address] [place], [place] [address] PCP: Sherren Mocha, MD   Assessment & Plan: Visit Diagnoses:  1. Chronic pain of both knees     Plan: End-stage osteoarthritis right knee which is the more symptomatic.  Has had cortisone injection and taking over-the-counter medicines with considerable compromise of her activities.  Casey [name] was interested in discussing knee replacement surgery.  I discussed this in detail with her and she would like to proceed sometime in the spring.  She will need clearance from her primary care physician.  I also have discussed possible rehab placement as she lives alone  Follow-Up Instructions: Return will schedule right TKR.   Orders:  Orders Placed This Encounter  Procedures  . XR KNEE 3 VIEW LEFT  . XR KNEE 3 VIEW RIGHT   No orders of the defined types were placed in this encounter.     Procedures: No procedures performed   Clinical Data: No additional findings.   Subjective: No chief complaint on file. Casey [name] is 82 years old visited the office for evaluation of bilateral knee pain.  She is having predominantly right knee discomfort.  She has a diagnosis of osteoarthritis and recently had a cortisone injection per her primary care physician.  She notes that it may "have helped a little bit".  She does live by herself and notes that she has compromise of her activities.  Her knee will swell "swell up".  Occasionally she will use a cane.  She has some discomfort in the left knee but not nearly to the extent as she does on the right.  She does not have any back pain or groin discomfort.  She has tried over-the-counter medicines with some mild relief of her pain.  She is aware of her diagnosis and would like to discuss knee replacement surgery  HPI  Review of  Systems   Objective: Vital Signs: BP (!) 154/80   Pulse 82   Ht 5\' 2"  (1.575 m)   Wt 203 lb (92.1 kg)   BMI 37.13 kg/m   Physical Exam Constitutional:      Appearance: She is well-developed.  Eyes:     Pupils: Pupils are equal, round, and reactive to light.  Pulmonary:     Effort: Pulmonary effort is normal.  Skin:    General: Skin is warm and dry.  Neurological:     Mental Status: She is alert and oriented to person, place, and time.  Psychiatric:        Behavior: Behavior normal.     Ortho Exam right knee with slight valgus on weightbearing.  Very small effusion.  Predominant lateral joint pain.  Some patellar crepitation but patella tracks in the midline.  Mild ankle edema bilaterally.  Motor and sensory exam intact.  Straight leg raise negative.  Painless range of motion both hips full extension and about 100 degrees of flexion right knee.  Skin intact  Specialty Comments:  No specialty comments available.  Imaging: Xr Knee 3 View Left  Result Date: 01/17/2018 Films of the left knee were obtained in 3 projections standing.  There is decrease in the medial joint space with large peripheral  osteophytes and subchondral sclerosis.  No ectopic calcification.  Degenerative changes are also identified at the patellofemoral joint and the lateral compartment with peripheral osteophytes.  Patella appears to be tilted laterally  Xr Knee 3 View Right  Result Date: 01/17/2018 Films of the right knee were obtained and several projections standing.  There is slight increased valgus with considerable degenerative changes in the lateral compartment.  There are peripheral osteophytes and subchondral sclerosis.  No ectopic calcification to suggest chondrocalcinosis.  Degenerative changes are also noted at the patellofemoral joint and medial compartment to a lesser degree.  Films are consistent with advanced osteoarthritis without acute change    PMFS History: Patient Active Problem  List   Diagnosis Date Noted  . Vitamin B12 deficiency 07/14/2017  . DJD (degenerative joint disease) of knee 07/07/2013  . Type 2 diabetes mellitus with microalbuminuria, without long-term current use of insulin (HCC) 05/26/2012  . Anemia 10/23/2011  . Encounter for medication monitoring 10/23/2011  . Hyponatremia 12/01/2010    Class: Acute  . Long Q-T syndrome 11/26/2010  . CYSTOCELE WITH INCOMPLETE UTERINE PROLAPSE 03/10/2010  . HYPERGLYCEMIA 02/06/2010  . ESOPHAGEAL STRICTURE 12/11/2009  . Obesity 12/09/2009  . PROBLEMS WITH SWALLOWING AND MASTICATION 12/09/2009  . MICROSCOPIC HEMATURIA 07/29/2009  . ARM PAIN, RIGHT 03/28/2009  . Fatigue 03/28/2009  . CONSTIPATION 09/25/2008  . Sinusitis, chronic 02/23/2008  . Osteoarthrosis involving lower leg 09/28/2007  . LEG EDEMA, BILATERAL 08/31/2007  . FLANK PAIN, LEFT 06/08/2007  . SPRAIN&STRAIN OTH SPEC SITES SHOULDER&UPPER ARM 06/08/2007  . BENIGN POSITIONAL VERTIGO 04/25/2007  . Headache 03/24/2007  . HYPERCHOLESTEROLEMIA 03/03/2007  . Anxiety state 03/03/2007  . HYPERTENSION, BENIGN ESSENTIAL 03/03/2007  . Allergic rhinitis 03/03/2007  . HIP PAIN, LEFT 03/03/2007  . Osteoporosis 03/03/2007   Past Medical History:  Diagnosis Date  . ALLERGIC RHINITIS   . Allergy   . ANXIETY   . ARM PAIN, RIGHT   . BENIGN POSITIONAL VERTIGO   . Carcinoma in situ of colon   . CONSTIPATION   . CYSTOCELE WITH INCOMPLETE UTERINE PROLAPSE   . ESOPHAGEAL STRICTURE   . FATIGUE   . FLANK PAIN, LEFT   . Headache(784.0)   . HIP PAIN, LEFT   . HYPERCHOLESTEROLEMIA   . HYPERGLYCEMIA   . HYPERTENSION, BENIGN ESSENTIAL 03/03/2007  . LEG EDEMA, BILATERAL   . Microscopic hematuria   . OBESITY   . OSTEOARTHRITIS, KNEE   . OSTEOPOROSIS   . Peripheral neuropathy, idiopathic   . Problems with swallowing and mastication   . SINUSITIS, RECURRENT   . SPRAIN&STRAIN OTH SPEC SITES SHOULDER&UPPER ARM     Family History  Problem Relation Age of Onset    . Coronary artery disease Mother 74  . Cancer Father 76  . Heart disease Father   . Colon cancer Other   . Diabetes Other   . Coronary artery disease Sister   . Diabetes type II Sister   . Cancer Sister   . Coronary artery disease Brother   . Kidney disease Brother   . Coronary artery disease Brother   . Breast cancer Neg Hx     Past Surgical History:  Procedure Laterality Date  . LEFT HEART CATHETERIZATION WITH CORONARY ANGIOGRAM N/A 12/02/2010   Procedure: LEFT HEART CATHETERIZATION WITH CORONARY ANGIOGRAM;  Surgeon: Alailah Safley M Swaziland, MD;  Location: [facility] CATH LAB;  Service: Cardiovascular;  Laterality: N/A;   Social History   Occupational History  . Not on file  Tobacco Use  . Smoking status:  Never Smoker  . Smokeless tobacco: Never Used  Substance and Sexual Activity  . Alcohol use: No  . Drug use: No  . Sexual activity: Not Currently     Valeria Batman, MD   Note - This record has been created using [employer].  Chart creation errors have been sought, but may not always  have been located. Such creation errors do not reflect on  the standard of medical care.

## 2018-01-17 NOTE — Progress Notes (Signed)
Pt stated B/lL knee pain, swelling and worse when walking/sitting/standing, Rt knee is the worse. Dr Clelia CroftShaw prescribe shot on both knee last Monday and it help some

## 2018-01-24 ENCOUNTER — Other Ambulatory Visit: Payer: Self-pay | Admitting: Family Medicine

## 2018-01-24 DIAGNOSIS — F411 Generalized anxiety disorder: Secondary | ICD-10-CM

## 2018-01-24 NOTE — Telephone Encounter (Signed)
Copied from CRM [id number]. Topic: Quick Communication - Rx Refill/Question >> Jan 24, 2018 10:16 AM Wyonia Hough E wrote: Medication: ALPRAZolam Prudy Feeler) 0.25 MG tablet - Pt was seen by Dr. Clelia Croft on 12.16.2019. Pt has 5 pills left   Has the patient contacted their pharmacy? Yes (pharmacy said they didn't have any refills)   Preferred Pharmacy (with phone number or street name): [facility] [phone] - [place], [place] - [address] AT [facility] OF [address] & [address] [phone] (Phone) [phone] (Fax)    Agent: Please be advised that RX refills may take up to 3 business days. We ask that you follow-up with your pharmacy.

## 2018-01-29 MED ORDER — ALPRAZOLAM 0.25 MG PO TABS: 0.2500 mg | ORAL_TABLET | Freq: Every evening | ORAL | 0 refills | Status: DC | PRN

## 2018-01-29 NOTE — Telephone Encounter (Signed)
Medication refilled

## 2018-01-29 NOTE — Addendum Note (Signed)
Addended by: Collie Siad A on: 01/29/2018 05:32 PM   Modules accepted: Orders

## 2018-01-31 ENCOUNTER — Other Ambulatory Visit: Payer: Self-pay | Admitting: Family Medicine

## 2018-01-31 DIAGNOSIS — M170 Bilateral primary osteoarthritis of knee: Secondary | ICD-10-CM

## 2018-01-31 NOTE — Telephone Encounter (Signed)
Copied from CRM [id number]. Topic: Quick Communication - Rx Refill/Question >> Jan 31, 2018 11:14 AM Lorrine Kin, NT wrote: Medication: HYDROcodone-acetaminophen (NORCO/VICODIN) 5-325 MG tablet  Has the patient contacted their pharmacy? Yes.   (Agent: If no, request that the patient contact the pharmacy for the refill.) (Agent: If yes, when and what did the pharmacy advise?)  Preferred Pharmacy (with phone number or street name): [id number] - [place], [place] - [address] AT [facility] OF [address]  Agent: Please be advised that RX refills may take up to 3 business days. We ask that you follow-up with your pharmacy.

## 2018-02-04 MED ORDER — HYDROCODONE-ACETAMINOPHEN 5-325 MG PO TABS: 1.0000 | ORAL_TABLET | Freq: Four times a day (QID) | ORAL | 0 refills | Status: DC | PRN

## 2018-02-04 NOTE — Telephone Encounter (Signed)
Pt called back and wants a refill on HYDROcodone-acetaminophen (NORCO/VICODIN) 5-325 MG tablet [[id number]]

## 2018-02-04 NOTE — Telephone Encounter (Signed)
Please Advise  Patient is requesting a refill of the following medications: Requested Prescriptions   Pending Prescriptions Disp Refills  . HYDROcodone-acetaminophen (NORCO/VICODIN) 5-325 MG tablet 120 tablet 0    Sig: Take 1 tablet by mouth every 6 (six) hours as needed for moderate pain. For chronic pain

## 2018-02-07 NOTE — Telephone Encounter (Signed)
Patient's concern/request has been addressed already by another provider or staff member. 

## 2018-02-08 ENCOUNTER — Ambulatory Visit (INDEPENDENT_AMBULATORY_CARE_PROVIDER_SITE_OTHER): Payer: Medicare Other | Admitting: Podiatry

## 2018-02-08 ENCOUNTER — Encounter: Payer: Self-pay | Admitting: Podiatry

## 2018-02-08 DIAGNOSIS — B351 Tinea unguium: Secondary | ICD-10-CM | POA: Diagnosis not present

## 2018-02-08 DIAGNOSIS — M79674 Pain in right toe(s): Secondary | ICD-10-CM

## 2018-02-08 DIAGNOSIS — M79675 Pain in left toe(s): Secondary | ICD-10-CM

## 2018-02-08 NOTE — Progress Notes (Signed)
Subjective: Casey [name] presents today with history of neuropathy with cc of painful, mycotic toenails.  Pain is aggravated when wearing enclosed shoe gear and relieved with periodic professional debridement.  Patient has peripheral neuropathy managed with gabapentin.  Sherren Mocha, MD is her PCP and last visit was on 01/03/2018.   Current Outpatient Medications:  .  ALPRAZolam (XANAX) 0.25 MG tablet, Take 1-2 tablets (0.25-0.5 mg total) by mouth at bedtime as needed for anxiety., Disp: 60 tablet, Rfl: 0 .  amLODipine (NORVASC) 5 MG tablet, Take 1 tablet (5 mg total) by mouth daily., Disp: 90 tablet, Rfl: 1 .  aspirin EC 81 MG tablet, Take 1 tablet (81 mg total) by mouth daily., Disp: , Rfl:  .  azelastine (ASTELIN) 0.1 % nasal spray, INSTILL 1 SPRAY INTO BOTH NOSTRILS TWICE DAILY AS DIRECTED, Disp: 30 mL, Rfl: 0 .  azelastine (OPTIVAR) 0.05 % ophthalmic solution, INSTILL 1 DROP IN BOTH EYES TWICE DAILY, Disp: 6 mL, Rfl: 0 .  bacitracin ophthalmic ointment, , Disp: , Rfl: 0 .  Cholecalciferol (VITAMIN D3) 5000 units CAPS, Take 1 capsule by mouth daily., Disp: , Rfl:  .  diclofenac sodium (VOLTAREN) 1 % GEL, Apply 2-4 g topically 4 (four) times daily., Disp: 100 g, Rfl: 1 .  FLUAD 0.5 ML SUSY, ADM 0.5ML IM UTD, Disp: , Rfl: 0 .  gabapentin (NEURONTIN) 600 MG tablet, TAKE 1 TABLET(600 MG) BY MOUTH THREE TIMES DAILY, Disp: 270 tablet, Rfl: 1 .  glucose blood test strip, 1 each by Other route daily. Use as instructed, Disp: 50 each, Rfl: 11 .  HYDROcodone-acetaminophen (NORCO/VICODIN) 5-325 MG tablet, Take 1 tablet by mouth every 6 (six) hours as needed for moderate pain. For chronic pain, Disp: 120 tablet, Rfl: 0 .  ipratropium (ATROVENT) 0.03 % nasal spray, , Disp: , Rfl:  .  lisinopril (PRINIVIL,ZESTRIL) 40 MG tablet, Take 1 tablet (40 mg total) by mouth daily., Disp: 90 tablet, Rfl: 1 .  meclizine (ANTIVERT) 25 MG tablet, Take 1 tablet (25 mg total) by mouth 3 (three) times daily., Disp: 270  tablet, Rfl: 1 .  metFORMIN (GLUCOPHAGE) 500 MG tablet, TAKE 1 TABLET(500 MG) BY MOUTH TWICE DAILY WITH A MEAL, Disp: 180 tablet, Rfl: 1 .  omeprazole (PRILOSEC) 40 MG capsule, Take 1 capsule (40 mg total) by mouth daily., Disp: 30 capsule, Rfl: 1 .  tobramycin-dexamethasone (TOBRADEX) ophthalmic solution, instill 1 drop into left eye four times a day if needed, Disp: , Rfl: 0  Current Facility-Administered Medications:  .  methylPREDNISolone acetate (DEPO-MEDROL) injection 40 mg, 40 mg, Intra-articular, Once, Sherren Mocha, MD .  methylPREDNISolone acetate (DEPO-MEDROL) injection 40 mg, 40 mg, Intra-articular, Once, Sherren Mocha, MD  No Known Allergies  Objective:  Vascular Examination: Capillary refill time immediate x 10 digits Dorsalis pedis and Posterior tibial pulses present b/l Digital hair x 10 digits was absent Skin temperature gradient WNL b/l  Dermatological Examination: Skin with normal turgor, texture and tone b/l  Toenails 1-5 b/l discolored, thick, dystrophic with subungual debris and pain with palpation to nailbeds due to thickness of nails.  Musculoskeletal: Muscle strength 5/5 to all muscle groups b/l  Pes planus foot deformity b/l with stable, intact midfoot.  Neurological: Sensation with 10 gram monofilament is absent b/l Vibratory sensation absent b/l  Assessment: 1. Painful onychomycosis toenails 1-5 b/l 2. Peripheral neuropathy  Plan: 1. Toenails 1-5 b/l were debrided in length and girth without iatrogenic bleeding. 2. Continue diabetic foot care principles and  daily foot inspection. 3. Patient to continue soft, supportive shoe gear 4. Patient to report any pedal injuries to medical professional immediately. 5. Follow up 3 months.  6. Patient/POA to call should there be a concern in the interim.

## 2018-02-11 ENCOUNTER — Other Ambulatory Visit: Payer: Self-pay | Admitting: Family Medicine

## 2018-02-11 NOTE — Telephone Encounter (Signed)
Requested Prescriptions  Pending Prescriptions Disp Refills  . azelastine (OPTIVAR) 0.05 % ophthalmic solution [Pharmacy Med Name: AZELASTINE 0.05% OPHTH SOLUTION 6ML] 6 mL 0    Sig: INSTILL 1 DROP IN BOTH EYES TWICE DAILY     Ophthalmology:  Antiallergy Passed - 02/11/2018  6:20 AM      Passed - Valid encounter within last 12 months    Recent Outpatient Visits          1 month ago Type 2 diabetes mellitus with microalbuminuria, without long-term current use of insulin [facility](HCC)   [facility], Levell JulyEva N, MD   3 months ago Chest wall pain   [facility], Madelaine BhatElizabeth Whitney, PA-C   4 months ago Type 2 diabetes mellitus with microalbuminuria, without long-term current use of insulin [facility](HCC)   [facility], Madelaine BhatElizabeth Whitney, PA-C   7 months ago Type 2 diabetes mellitus with microalbuminuria, without long-term current use of insulin [facility](HCC)   [facility], Levell JulyEva N, MD   10 months ago Type 2 diabetes mellitus with microalbuminuria, without long-term current use of insulin [facility](HCC)   [facility], Levell JulyEva N, MD      Future Appointments            In 1 month Clelia CroftShaw, Levell JulyEva N, MD [facility]

## 2018-02-25 ENCOUNTER — Other Ambulatory Visit: Payer: Self-pay | Admitting: Family Medicine

## 2018-02-28 ENCOUNTER — Other Ambulatory Visit: Payer: Self-pay | Admitting: Family Medicine

## 2018-02-28 DIAGNOSIS — F411 Generalized anxiety disorder: Secondary | ICD-10-CM

## 2018-02-28 NOTE — Telephone Encounter (Signed)
Please Advise  Patient is requesting a refill of the following medications: Requested Prescriptions   Pending Prescriptions Disp Refills  . ALPRAZolam (XANAX) 0.25 MG tablet [Pharmacy Med Name: ALPRAZOLAM 0.25MG  TABLETS] 60 tablet     Sig: TAKE 1 TO 2 TABLETS(0.25 TO 0.5 MG) BY MOUTH AT BEDTIME AS NEEDED FOR ANXIETY

## 2018-02-28 NOTE — Telephone Encounter (Signed)
Refill for alprazolam 0.25 mg. Need urine drug screen. LOV  01/03/18. NOV  04/05/2018 Dr. Clelia CroftShaw

## 2018-03-03 NOTE — Telephone Encounter (Signed)
Pt last had alprazolam refilled on 1/11 by Dr. Creta Levin #60 tabs -  Pt picked up on 1/15, prior to that rx from me on 12/15, proir to that same rx refilled by McVey written 9/24 filled same day with refilled on 10/29. Pt has v/u OV w/ me sched for 3/17 so will send in 2 mos supply which should last her to that appt.

## 2018-03-07 ENCOUNTER — Other Ambulatory Visit: Payer: Self-pay | Admitting: Family Medicine

## 2018-03-07 DIAGNOSIS — K219 Gastro-esophageal reflux disease without esophagitis: Secondary | ICD-10-CM

## 2018-03-07 NOTE — Telephone Encounter (Signed)
Called pt to confirm that refill of Omeprazole was still needed. Last refill on file 10/12/17 #30. Pt confirms that she does need refill of medication and that she takes the medication everyday. Refill sent to pharmacy.

## 2018-03-18 ENCOUNTER — Ambulatory Visit
Admission: RE | Admit: 2018-03-18 | Discharge: 2018-03-18 | Disposition: A | Discharge status: Home / Self Care | Payer: Medicare Other | Source: Ambulatory Visit | Attending: Family Medicine | Admitting: Family Medicine

## 2018-03-18 DIAGNOSIS — M810 Age-related osteoporosis without current pathological fracture: Secondary | ICD-10-CM

## 2018-03-21 ENCOUNTER — Other Ambulatory Visit: Payer: Self-pay | Admitting: Family Medicine

## 2018-03-21 DIAGNOSIS — IMO0002 Reserved for concepts with insufficient information to code with codable children: Secondary | ICD-10-CM

## 2018-03-21 DIAGNOSIS — M170 Bilateral primary osteoarthritis of knee: Secondary | ICD-10-CM

## 2018-03-21 DIAGNOSIS — M1710 Unilateral primary osteoarthritis, unspecified knee: Secondary | ICD-10-CM

## 2018-03-21 NOTE — Telephone Encounter (Signed)
Copied from CRM [id number]. Topic: Quick Communication - Rx Refill/Question >> Mar 21, 2018 12:44 PM Lynne Logan D wrote: Medication: HYDROcodone-acetaminophen (NORCO/VICODIN) 5-325 MG tablet   Has the patient contacted their pharmacy? No (Agent: If no, request that the patient contact the pharmacy for the refill.) (Agent: If yes, when and what did the pharmacy advise?)  Preferred Pharmacy (with phone number or street name): [facility] [phone] - [place], [place] - [address] AT [address] [phone] (Phone) [phone] (Fax)    Agent: Please be advised that RX refills may take up to 3 business days. We ask that you follow-up with your pharmacy.

## 2018-03-21 NOTE — Telephone Encounter (Signed)
Hydrocodone refill request.  

## 2018-03-22 NOTE — Telephone Encounter (Signed)
Please advise 

## 2018-03-25 NOTE — Telephone Encounter (Signed)
Needs refill for   HYDROcodone-acetaminophen (NORCO/VICODIN) 5-325 MG tablet    [facility] [phone] - [place], [place] - [phone] AT [address] [facility] [address] [phone] (Phone) [phone] (Fax)

## 2018-03-28 MED ORDER — HYDROCODONE-ACETAMINOPHEN 5-325 MG PO TABS: 1.0000 | ORAL_TABLET | Freq: Four times a day (QID) | ORAL | 0 refills | Status: DC | PRN

## 2018-03-28 NOTE — Addendum Note (Signed)
Addended by: Sherren Mocha on: 03/28/2018 02:34 PM   Modules accepted: Orders

## 2018-03-28 NOTE — Telephone Encounter (Signed)
Sent in refill for #120 which normally lasts pt for 2 mos - PDMP reviewed - last filled on 02/05/18 Pt referred to [facility] pain management

## 2018-03-28 NOTE — Telephone Encounter (Signed)
Called and informed pt that Rx has been sent.  ?

## 2018-03-29 ENCOUNTER — Encounter: Payer: Self-pay | Admitting: Radiology

## 2018-04-04 ENCOUNTER — Other Ambulatory Visit: Payer: Self-pay

## 2018-04-04 ENCOUNTER — Ambulatory Visit (INDEPENDENT_AMBULATORY_CARE_PROVIDER_SITE_OTHER): Payer: Medicare Other | Admitting: Family Medicine

## 2018-04-04 ENCOUNTER — Telehealth: Payer: Self-pay

## 2018-04-04 ENCOUNTER — Encounter: Payer: Self-pay | Admitting: Family Medicine

## 2018-04-04 VITALS — BP 138/70 | HR 82 | Temp 98.0°F | Resp 17 | Ht 62.0 in | Wt 211.4 lb

## 2018-04-04 DIAGNOSIS — Z01818 Encounter for other preprocedural examination: Secondary | ICD-10-CM

## 2018-04-04 DIAGNOSIS — R809 Proteinuria, unspecified: Secondary | ICD-10-CM

## 2018-04-04 DIAGNOSIS — I10 Essential (primary) hypertension: Secondary | ICD-10-CM | POA: Diagnosis not present

## 2018-04-04 DIAGNOSIS — M170 Bilateral primary osteoarthritis of knee: Secondary | ICD-10-CM

## 2018-04-04 DIAGNOSIS — E1129 Type 2 diabetes mellitus with other diabetic kidney complication: Secondary | ICD-10-CM

## 2018-04-04 DIAGNOSIS — E7800 Pure hypercholesterolemia, unspecified: Secondary | ICD-10-CM

## 2018-04-04 LAB — URINALYSIS, ROUTINE W REFLEX MICROSCOPIC
Bilirubin, UA: NEGATIVE
Glucose, UA: NEGATIVE
Ketones, UA: NEGATIVE
Leukocytes, UA: NEGATIVE
Nitrite, UA: NEGATIVE
Protein, UA: NEGATIVE
RBC, UA: NEGATIVE
Specific Gravity, UA: 1.006 (ref 1.005–1.030)
Urobilinogen, Ur: 0.2 mg/dL (ref 0.2–1.0)
pH, UA: 8 — ABNORMAL HIGH (ref 5.0–7.5)

## 2018-04-04 MED ORDER — AZELASTINE HCL 0.1 % NA SOLN: NASAL | 3 refills | Status: DC

## 2018-04-04 NOTE — Patient Instructions (Signed)
° ° ° °  If you have lab work done today you will be contacted with your lab results within the next 2 weeks.  If you have not heard from us then please contact us. The fastest way to get your results is to register for My Chart. ° ° °IF you received an x-ray today, you will receive an invoice from [facility]. Please contact [facility] at [phone] with questions or concerns regarding your invoice.  ° °IF you received labwork today, you will receive an invoice from [facility]. Please contact [facility] at [phone] with questions or concerns regarding your invoice.  ° °Our billing staff will not be able to assist you with questions regarding bills from these companies. ° °You will be contacted with the lab results as soon as they are available. The fastest way to get your results is to activate your My Chart account. Instructions are located on the last page of this paperwork. If you have not heard from us regarding the results in 2 weeks, please contact this office. °  ° ° ° °

## 2018-04-04 NOTE — Progress Notes (Signed)
3/16/202010:54 AM  Casey [name] [birth year], 83 y.o. female [id number]  Chief Complaint  Patient presents with  . Diabetes    01/06/18 shaw pt.  pt c/o knee  pain and wants steroid injection.   . diarrhea x 2 weeks  . Medication Refill    azelastine 0.5% and 0.1%   . surgical clearance form    needs form completed for [facility]    HPI:   Patient is a 83 y.o. female with past medical history significant for DM2 with peripheral neuropathy and microabuminuria, HTN, HLP, OA of knee, vitamin b12 deficiency, osteoporosis, seasonal allergies,  who presents today for surgical clearance for Right TKR  Previous PCP Dr Clelia Croft, last OV Dec 2019 Orthopedist, Dr Cleophas Dunker, last OV Dec 2019  RCRI: Right TKA Surgery risk: moderate H/o ischemic heart disease: none, normal Left heart cath in 2012 H/o of heart failure: no H/o CVA: none DM on insulin: no Preoperative creatinine > /dl: no No previous general anesthesia METS > 4: no Per ACS NSQIP risk calculator she is at average risk  Does not check her cbgs at home, takes metformin  BID Does not check BP at home, takes amlodipine  and lisinopril , normally spreads them apart, so far today has only taken lisinopril She does not take cholesterol medication, made her legs hurt  pmp reviewed vicodin - last refilled March 30 2018, #120, prior to that Feb 05 2018, takes about 2 a day Alprazolam - last refill Mar 09, 2018 #60, prior to that Feb 02, 2018  Lab Results  Component Value Date   HGBA1C 6.6 (A) 01/03/2018   HGBA1C 6.5 (A) 10/12/2017   HGBA1C 6.7 (A) 07/01/2017   Lab Results  Component Value Date   MICROALBUR 0.7 10/11/2014   LDLCALC 123 (H) 01/03/2018   CREATININE 0.91 01/03/2018     Fall Risk  04/04/2018 01/03/2018 11/11/2017 07/01/2017 03/29/2017  Falls in the past year? 0 0 No No No  Number falls in past yr: 0 0 - - -  Injury with Fall? 0 0 - - -  Follow up Falls evaluation completed - - - -    Depression screen [facility] 2/9 04/04/2018 01/03/2018 11/11/2017  Decreased Interest 0 0 0  Down, Depressed, Hopeless 0 0 0  PHQ - 2 Score 0 0 0  Altered sleeping - - -  Tired, decreased energy - - -  Change in appetite - - -  Feeling bad or failure about yourself  - - -  Trouble concentrating - - -  Moving slowly or fidgety/restless - - -  Suicidal thoughts - - -  PHQ-9 Score - - -  Difficult doing work/chores - - -    No Known Allergies  Prior to Admission medications   Medication Sig Start Date End Date Taking? Authorizing Provider  ALPRAZolam (XANAX) 0.25 MG tablet TAKE 1 TO 2 TABLETS(0.25 TO 0.5 MG) BY MOUTH AT BEDTIME AS NEEDED FOR ANXIETY 03/03/18  Yes Sherren Mocha, MD  amLODipine (NORVASC) 5 MG tablet Take 1 tablet (5 mg total) by mouth daily. 10/12/17  Yes McVey, Madelaine Bhat, PA-C  aspirin EC 81 MG tablet Take 1 tablet (81 mg total) by mouth daily. 08/21/15  Yes Sherren Mocha, MD  azelastine (ASTELIN) 0.1 % nasal spray USE 1 SPRAY IN EACH NOSTRIL TWICE DAILY AS DIRECTED 02/25/18  Yes Sherren Mocha, MD  azelastine (OPTIVAR) 0.05 % ophthalmic solution INSTILL 1 DROP IN BOTH EYES TWICE DAILY 02/11/18  Yes  Collie Siad A, MD  gabapentin (NEURONTIN) 600 MG tablet TAKE 1 TABLET(600 MG) BY MOUTH THREE TIMES DAILY 01/13/18  Yes McVey, Madelaine Bhat, PA-C  HYDROcodone-acetaminophen (NORCO/VICODIN) 5-325 MG tablet Take 1 tablet by mouth every 6 (six) hours as needed for moderate pain. For chronic pain 03/28/18  Yes Sherren Mocha, MD  lisinopril (PRINIVIL,ZESTRIL) 40 MG tablet Take 1 tablet (40 mg total) by mouth daily. 10/12/17  Yes McVey, Madelaine Bhat, PA-C  meclizine (ANTIVERT) 25 MG tablet Take 1 tablet (25 mg total) by mouth 3 (three) times daily. 10/12/17  Yes McVey, Madelaine Bhat, PA-C  metFORMIN (GLUCOPHAGE) 500 MG tablet TAKE 1 TABLET(500 MG) BY MOUTH TWICE DAILY WITH A MEAL 01/13/18  Yes McVey, Madelaine Bhat, PA-C  omeprazole (PRILOSEC) 40 MG capsule TAKE 1 CAPSULE BY MOUTH  ONCE DAILY 03/07/18  Yes Sherren Mocha, MD  bacitracin ophthalmic ointment  05/26/17   [provider]  Cholecalciferol (VITAMIN D3) 5000 units CAPS Take 1 capsule by mouth daily.    [provider]  diclofenac sodium (VOLTAREN) 1 % GEL Apply 2-4 g topically 4 (four) times daily. Patient not taking: Reported on 04/04/2018 11/11/17   McVey, Madelaine Bhat, PA-C  FLUAD 0.5 ML SUSY ADM 0.5ML IM UTD 09/23/17   [provider]  glucose blood test strip 1 each by Other route daily. Use as instructed 09/08/16   Sherren Mocha, MD  ipratropium (ATROVENT) 0.03 % nasal spray  04/21/16   [provider]  tobramycin-dexamethasone [facility]) ophthalmic solution instill 1 drop into left eye four times a day if needed 01/29/17   [provider]    Past Medical History:  Diagnosis Date  . ALLERGIC RHINITIS   . Allergy   . ANXIETY   . ARM PAIN, RIGHT   . BENIGN POSITIONAL VERTIGO   . Carcinoma in situ of colon   . CONSTIPATION   . CYSTOCELE WITH INCOMPLETE UTERINE PROLAPSE   . ESOPHAGEAL STRICTURE   . FATIGUE   . FLANK PAIN, LEFT   . Headache(784.0)   . HIP PAIN, LEFT   . HYPERCHOLESTEROLEMIA   . HYPERGLYCEMIA   . HYPERTENSION, BENIGN ESSENTIAL 03/03/2007  . LEG EDEMA, BILATERAL   . Microscopic hematuria   . OBESITY   . OSTEOARTHRITIS, KNEE   . OSTEOPOROSIS   . Peripheral neuropathy, idiopathic   . Problems with swallowing and mastication   . SINUSITIS, RECURRENT   . SPRAIN&STRAIN OTH SPEC SITES SHOULDER&UPPER ARM     Past Surgical History:  Procedure Laterality Date  . LEFT HEART CATHETERIZATION WITH CORONARY ANGIOGRAM N/A 12/02/2010   Procedure: LEFT HEART CATHETERIZATION WITH CORONARY ANGIOGRAM;  Surgeon: Peter M Swaziland, MD;  Location[facility] CATH LAB;  Service: Cardiovascular;  Laterality: N/A;    Social History   Tobacco Use  . Smoking status: Never Smoker  . Smokeless tobacco: Never Used  Substance Use Topics  . Alcohol use: No    Family History   Problem Relation Age of Onset  . Coronary artery disease Mother 32  . Cancer Father 35  . Heart disease Father   . Colon cancer Other   . Diabetes Other   . Coronary artery disease Sister   . Diabetes type II Sister   . Cancer Sister   . Coronary artery disease Brother   . Kidney disease Brother   . Coronary artery disease Brother   . Breast cancer Neg Hx     Review of Systems  Constitutional: Negative for chills and fever.  HENT: Positive for congestion. Negative for sinus pain.   Respiratory: Negative for cough and shortness of breath.   Cardiovascular: Positive for leg swelling. Negative for chest pain and palpitations.  Gastrointestinal: Positive for abdominal pain (intermittent, prior to BM), constipation and diarrhea (2 weeks, no frank diarrhea, but when she eats she has a BM). Negative for blood in stool, melena, nausea and vomiting.  Genitourinary: Negative for dysuria and hematuria.  Musculoskeletal: Positive for joint pain (had steroid injection in dec 2019). Negative for falls.  Neurological: Positive for tingling. Negative for focal weakness.  Endo/Heme/Allergies: Positive for environmental allergies.  Psychiatric/Behavioral: Negative for depression and suicidal ideas. The patient does not have insomnia (takes xanax for sleep).     OBJECTIVE:  Blood pressure 138/70, pulse 82, temperature 98 F (36.7 C), temperature source Oral, resp. rate 17, height  (1.575 m), weight 211 lb 6.4 oz (95.9 kg), SpO2 93 %. Body mass index is 38.67 kg/m.    Physical Exam Vitals signs and nursing note reviewed.  Constitutional:      Appearance: She is well-developed.  HENT:     Head: Normocephalic and atraumatic.     Right Ear: Hearing, tympanic membrane, ear canal and external ear normal.     Left Ear: Hearing, tympanic membrane, ear canal and external ear normal.  Eyes:     Conjunctiva/sclera: Conjunctivae normal.     Pupils: Pupils are equal, round, and reactive to  light.  Neck:     Musculoskeletal: Neck supple.     Thyroid: No thyromegaly.  Cardiovascular:     Rate and Rhythm: Normal rate and regular rhythm.     Heart sounds: Normal heart sounds. No murmur. No friction rub. No gallop.   Pulmonary:     Effort: Pulmonary effort is normal.     Breath sounds: Normal breath sounds. No wheezing or rales.  Abdominal:     General: Bowel sounds are normal. There is no distension.     Palpations: Abdomen is soft. There is no mass.     Tenderness: There is no abdominal tenderness.  Musculoskeletal: Normal range of motion.     Right lower leg: Edema (trace) present.     Left lower leg: Edema (trace) present.  Lymphadenopathy:     Cervical: No cervical adenopathy.  Skin:    General: Skin is warm and dry.  Neurological:     Mental Status: She is alert and oriented to person, place, and time.     Cranial Nerves: No cranial nerve deficit.     Gait: Gait normal.     Deep Tendon Reflexes: Reflexes are normal and symmetric.    My interpretation of EKG:  NSR, HR 77, LAE, non specific T wave changes, unchanged compared to oct 2019  ASSESSMENT and PLAN  1. Pre-operative clearance Per ACA guidelines, intermediate clinical markers with METS < 4. Further cardiac risk stratification needed. Referring to cardiology for cardiac clearance. Otherwise, medically optimized, pending lab results. - CBC - EKG 12-Lead  2. Primary osteoarthritis of both knees Patient had last knee injection in December. I advised for her to discuss further injections with ortho - CBC - EKG 12-Lead  3. Type 2 diabetes mellitus with microalbuminuria, without long-term current use of insulin (HCC) Per last A1c, at goal. - Urinalysis, Routine w reflex microscopic - Comprehensive metabolic panel - Hemoglobin A1c  4. HYPERTENSION, BENIGN ESSENTIAL Controlled. Continue current regime.  - Care order/instruction:  5. HYPERCHOLESTEROLEMIA Patient declines use of statins.  - Lipid panel  Other orders - azelastine (ASTELIN) 0.1 % nasal spray; USE 1 SPRAY IN EACH NOSTRIL TWICE DAILY AS DIRECTED  Return in about 3 months (around 07/05/2018).    Myles Lipps, MD Primary Care at [facility] [address] [place], [place] [address] Ph.  [phone] Fax [phone]

## 2018-04-04 NOTE — Telephone Encounter (Signed)
Called pt an advised dr Leretha Pol sentin referral for cardiology so to be looking for call from there office.  Pt agreeable. Dgaddy, CMA

## 2018-04-05 ENCOUNTER — Ambulatory Visit: Payer: Medicare Other | Admitting: Family Medicine

## 2018-04-05 LAB — LIPID PANEL
Chol/HDL Ratio: 2.9 ratio (ref 0.0–4.4)
Cholesterol, Total: 239 mg/dL — ABNORMAL HIGH (ref 100–199)
HDL: 82 mg/dL (ref >39)
LDL Calculated: 146 mg/dL — ABNORMAL HIGH (ref 0–99)
Triglycerides: 54 mg/dL (ref 0–149)
VLDL Cholesterol Cal: 11 mg/dL (ref 5–40)

## 2018-04-05 LAB — COMPREHENSIVE METABOLIC PANEL
ALT: 36 IU/L — ABNORMAL HIGH (ref 0–32)
AST: 30 IU/L (ref 0–40)
Albumin/Globulin Ratio: 1.7 (ref 1.2–2.2)
Albumin: 4.2 g/dL (ref 3.6–4.6)
Alkaline Phosphatase: 98 IU/L (ref 39–117)
BUN/Creatinine Ratio: 13 (ref 12–28)
BUN: 11 mg/dL (ref 8–27)
Bilirubin Total: <0.2 mg/dL (ref 0.0–1.2)
CO2: 25 mmol/L (ref 20–29)
Calcium: 9.8 mg/dL (ref 8.7–10.3)
Chloride: 101 mmol/L (ref 96–106)
Creatinine, Ser: 0.82 mg/dL (ref 0.57–1.00)
GFR calc Af Amer: 78 mL/min/{1.73_m2} (ref >59)
GFR calc non Af Amer: 67 mL/min/{1.73_m2} (ref >59)
Globulin, Total: 2.5 g/dL (ref 1.5–4.5)
Glucose: 108 mg/dL — ABNORMAL HIGH (ref 65–99)
Potassium: 4.5 mmol/L (ref 3.5–5.2)
Sodium: 142 mmol/L (ref 134–144)
Total Protein: 6.7 g/dL (ref 6.0–8.5)

## 2018-04-05 LAB — CBC
Hematocrit: 32.6 % — ABNORMAL LOW (ref 34.0–46.6)
Hemoglobin: 10.9 g/dL — ABNORMAL LOW (ref 11.1–15.9)
MCH: 27.7 pg (ref 26.6–33.0)
MCHC: 33.4 g/dL (ref 31.5–35.7)
MCV: 83 fL (ref 79–97)
Platelets: 334 10*3/uL (ref 150–450)
RBC: 3.93 x10E6/uL (ref 3.77–5.28)
RDW: 13.1 % (ref 11.7–15.4)
WBC: 6.3 10*3/uL (ref 3.4–10.8)

## 2018-04-05 LAB — HEMOGLOBIN A1C
Est. average glucose Bld gHb Est-mCnc: 143 mg/dL
Hgb A1c MFr Bld: 6.6 % — ABNORMAL HIGH (ref 4.8–5.6)

## 2018-05-05 ENCOUNTER — Encounter: Payer: Self-pay | Admitting: Cardiology

## 2018-05-10 ENCOUNTER — Ambulatory Visit: Payer: Medicare Other | Admitting: Podiatry

## 2018-05-10 ENCOUNTER — Telehealth: Payer: Self-pay | Admitting: Family Medicine

## 2018-05-10 DIAGNOSIS — M170 Bilateral primary osteoarthritis of knee: Secondary | ICD-10-CM

## 2018-05-10 NOTE — Telephone Encounter (Signed)
Pt is requesting pain medication. 

## 2018-05-10 NOTE — Telephone Encounter (Signed)
Copied from CRM [id number]. Topic: Quick Communication - Rx Refill/Question >> May 10, 2018 11:11 AM Louie Bun, Rosey Bath D wrote: Medication: HYDROcodone-acetaminophen (NORCO/VICODIN) 5-325 MG tablet  Has the patient contacted their pharmacy? Yes (Agent: If no, request that the patient contact the pharmacy for the refill.) (Agent: If yes, when and what did the pharmacy advise?)  Preferred Pharmacy (with phone number or street name):[facility] [phone] - [place], [place] - [address]  Agent: Please be advised that RX refills may take up to 3 business days. We ask that you follow-up with your pharmacy.

## 2018-05-11 MED ORDER — HYDROCODONE-ACETAMINOPHEN 5-325 MG PO TABS: 1.0000 | ORAL_TABLET | Freq: Four times a day (QID) | ORAL | 0 refills | Status: DC | PRN

## 2018-05-11 NOTE — Telephone Encounter (Signed)
pmp reviewed  Med refilled 

## 2018-05-12 ENCOUNTER — Other Ambulatory Visit: Payer: Self-pay | Admitting: Physician Assistant

## 2018-05-12 ENCOUNTER — Telehealth: Payer: Self-pay | Admitting: Cardiology

## 2018-05-12 DIAGNOSIS — I10 Essential (primary) hypertension: Secondary | ICD-10-CM

## 2018-05-12 NOTE — Telephone Encounter (Signed)
Home phone only/ virtual consent/ my chart declined/ pre reg completed

## 2018-05-13 ENCOUNTER — Telehealth (INDEPENDENT_AMBULATORY_CARE_PROVIDER_SITE_OTHER): Payer: Medicare Other | Admitting: Cardiology

## 2018-05-13 ENCOUNTER — Encounter: Payer: Self-pay | Admitting: Cardiology

## 2018-05-13 ENCOUNTER — Other Ambulatory Visit: Payer: Self-pay | Admitting: Family Medicine

## 2018-05-13 ENCOUNTER — Telehealth: Payer: Self-pay | Admitting: *Deleted

## 2018-05-13 DIAGNOSIS — I10 Essential (primary) hypertension: Secondary | ICD-10-CM | POA: Diagnosis not present

## 2018-05-13 DIAGNOSIS — F411 Generalized anxiety disorder: Secondary | ICD-10-CM

## 2018-05-13 DIAGNOSIS — Z01810 Encounter for preprocedural cardiovascular examination: Secondary | ICD-10-CM | POA: Insufficient documentation

## 2018-05-13 DIAGNOSIS — R079 Chest pain, unspecified: Secondary | ICD-10-CM

## 2018-05-13 DIAGNOSIS — R809 Proteinuria, unspecified: Secondary | ICD-10-CM

## 2018-05-13 DIAGNOSIS — E1129 Type 2 diabetes mellitus with other diabetic kidney complication: Secondary | ICD-10-CM

## 2018-05-13 NOTE — Assessment & Plan Note (Signed)
Chest pain in the past and was evaluated 8 years ago with a mildly limited cardiac catheterization. My suspicion is that the symptom is probably musculoskeletal and probably costochondritis, but in the absence of ability to do physical exam, and in light of Casey [name] potential upcoming surgery, I think is not unreasonable to do an ischemic evaluation.  This should probably be done as part of Casey [name] preop evaluation and therefore we will plan for a Lexiscan Myoview.  Try to get this expedited as soon as COVID-19 restrictions are lifted for studies.

## 2018-05-13 NOTE — Telephone Encounter (Signed)
CALLED TO REVIEW WITH PATIENT INSTRUCTIONS  - NO ANSWER   WIL TRY AGAIN

## 2018-05-13 NOTE — Progress Notes (Signed)
Virtual Visit via Telephone Note   This visit type was conducted due to national recommendations for restrictions regarding the COVID-19 Pandemic (e.g. social distancing) in an effort to limit this patient's exposure and mitigate transmission in our community.  Due to her co-morbid illnesses, this patient is at least at moderate risk for complications without adequate follow up.  This format is felt to be most appropriate for this patient at this time.  The patient did not have access to video technology/had technical difficulties with video requiring transitioning to audio format only (telephone).  All issues noted in this document were discussed and addressed.  No physical exam could be performed with this format.  Please refer to the patient's chart for her  consent to telehealth for [facility].   No video technology.  Patient has given verbal permission to conduct this visit via virtual appointment and to bill insurance 05/13/2018 1:05 PM     Evaluation Performed:  Follow-up visit  Date:  05/13/2018   ID:  Casey [name], DOB 1935-11-01, MRN [id number]  Patient Location: Home Provider Location: Home  PCP:  Myles Lipps, MD  Cardiologist:  Bryan Lemma, MD New Electrophysiologist:  None   Chief Complaint:  Pre-op CV Evaluation  History of Present Illness:    Casey [name] is a 83 y.o. female with PMH notable for HTN, HLD & DM-2 & NEGATIVE CARDIAC EVALUATION ~8 yrs ago who presents via audio/video conferencing for a telehealth visit today for preoperative risk evaluation.   She is being evaluated today at the request of Dr. Leretha Pol.Casey [name] was last seen by Dr. Leretha Pol on March 16 for preop evaluation prior to right knee TKR. Because of her limited mobility METS<4, she is referred for further cardiac risk ratification.  Unfortunately, because of COVID-19 restrictions, this could not be an in person visit and therefore is being done via telehealth visit.   Interval History:  Casey [name] is a somewhat convoluted historian.  When I first talking to her, she seems to be mostly limited by her knee pain, most notably when she goes grocery shopping.  She is able to walk around the grocery store, but has to stop more because of knee pain than shortness of breath, and denies any chest pain situation.  As a start doing cardiovascular review of symptoms she then mentions that she has these nagging pains in her chest, actually occurring at this time at rest.  She says that they can occur when she is doing her vacuuming and cleaning, but does not notice it when walking in the grocery store.  When she says is the worst is actually the day after doing some cleaning as opposed to when she is actually cleaning.  Symptoms can happen at rest chronic findings are simply more after exertion.  Somewhat associated with some shortness of breath but she is quite deconditioned having not been able to walk much for the last year. She bilateral lower extremity edema this seems to be worse by the end of the day unless she is able to put her feet up for a little while.  Swelling goes down by the morning and her legs look great.  She also has swelling of the right knee.  Cardiovascular ROS: positive for - chest pain, dyspnea on exertion, edema, palpitations and skipped beats.  easy fatigue.  lightheaded/dizziness (? vertigo related) negative for - orthopnea, paroxysmal nocturnal dyspnea or Syncope, TIA/amaurosis fugax   She has had very rare episodes  feeling her heart racing for a few minutes and then come back.  Probably related to anxiety.  She does indicate she is a very anxious person, takes a pill for it.  The patient does not have symptoms concerning for COVID-19 infection (fever, chills, cough, or new shortness of breath).  The patient is practicing social distancing.  Grocery shopping 1 x - got a month supply.   ROS:  Please see the history of present illness.    Review of Systems   Constitutional: Positive for malaise/fatigue (deconditioned). Negative for chills and fever.  HENT: Negative for congestion and nosebleeds.   Respiratory: Negative for cough, shortness of breath and wheezing.   Gastrointestinal: Positive for constipation (can be related to medications). Negative for blood in stool, diarrhea (+ loose stool), heartburn, melena, nausea and vomiting.  Genitourinary: Negative for hematuria.  Musculoskeletal: Positive for joint pain (knees; L hip).  Neurological: Positive for dizziness and weakness (with "swimmiheaded" sensation - feels "weeek"). Negative for sensory change, speech change and focal weakness.  Psychiatric/Behavioral: The patient is nervous/anxious (takes nerve pills.  Very anxious, so she stopped driving.).   All other systems reviewed and are negative.   Past Medical History:  Diagnosis Date  . ALLERGIC RHINITIS   . Allergy   . ANXIETY   . ARM PAIN, RIGHT   . BENIGN POSITIONAL VERTIGO   . Carcinoma in situ of colon   . CONSTIPATION   . CYSTOCELE WITH INCOMPLETE UTERINE PROLAPSE   . ESOPHAGEAL STRICTURE   . FATIGUE   . FLANK PAIN, LEFT   . Headache(784.0)   . HIP PAIN, LEFT   . HYPERCHOLESTEROLEMIA   . HYPERGLYCEMIA   . HYPERTENSION, BENIGN ESSENTIAL 03/03/2007  . LEG EDEMA, BILATERAL   . Microscopic hematuria   . OBESITY   . OSTEOARTHRITIS, KNEE   . OSTEOPOROSIS   . Peripheral neuropathy, idiopathic   . Problems with swallowing and mastication   . SINUSITIS, RECURRENT   . SPRAIN&STRAIN OTH SPEC SITES SHOULDER&UPPER ARM    Past Surgical History:  Procedure Laterality Date  . LEFT HEART CATHETERIZATION WITH CORONARY ANGIOGRAM N/A 12/02/2010   Procedure: LEFT HEART CATHETERIZATION WITH CORONARY ANGIOGRAM;  Surgeon: Peter M SwazilandJordan, MD;  [facility];  Service: Cardiovascular;:: NORMAL CORONARY ARTERIES - NORMAL LV FUNCTION & EDP.    Marland Kitchen. TRANSTHORACIC ECHOCARDIOGRAM  11/2010   Normal EF 60-65%. No RWMA.  Mild LAD dilation c/w  Gr 1 DD.  -- PRETTY NORMAL ECHO     Current Meds  Medication Sig  . ALPRAZolam (XANAX) 0.25 MG tablet TAKE 1 TO 2 TABLETS(0.25 TO 0.5 MG) BY MOUTH AT BEDTIME AS NEEDED FOR ANXIETY  . amLODipine (NORVASC) 5 MG tablet TAKE 1 TABLET(5 MG) BY MOUTH DAILY  . aspirin EC 81 MG tablet Take 1 tablet (81 mg total) by mouth daily.  Marland Kitchen. azelastine (ASTELIN) 0.1 % nasal spray USE 1 SPRAY IN EACH NOSTRIL TWICE DAILY AS DIRECTED  . azelastine (OPTIVAR) 0.05 % ophthalmic solution INSTILL 1 DROP IN BOTH EYES TWICE DAILY  . gabapentin (NEURONTIN) 600 MG tablet TAKE 1 TABLET(600 MG) BY MOUTH THREE TIMES DAILY  . glucose blood test strip 1 each by Other route daily. Use as instructed  . HYDROcodone-acetaminophen (NORCO/VICODIN) 5-325 MG tablet Take 1 tablet by mouth every 6 (six) hours as needed for moderate pain. For chronic pain  . lisinopril (PRINIVIL,ZESTRIL) 40 MG tablet Take 1 tablet (40 mg total) by mouth daily.  . meclizine (ANTIVERT) 25 MG tablet Take  1 tablet (25 mg total) by mouth 3 (three) times daily.  . metFORMIN (GLUCOPHAGE) 500 MG tablet TAKE 1 TABLET(500 MG) BY MOUTH TWICE DAILY WITH A MEAL  . omeprazole (PRILOSEC) 40 MG capsule TAKE 1 CAPSULE BY MOUTH ONCE DAILY  . [DISCONTINUED] diclofenac sodium (VOLTAREN) 1 % GEL Apply 2-4 g topically 4 (four) times daily.   Current Facility-Administered Medications for the 05/13/18 encounter (Telemedicine) with Casey Lex, MD  Medication  . methylPREDNISolone acetate (DEPO-MEDROL) injection 40 mg  . methylPREDNISolone acetate (DEPO-MEDROL) injection 40 mg     Allergies:   Patient has no known allergies.   Social History   Tobacco Use  . Smoking status: Never Smoker  . Smokeless tobacco: Never Used  Substance Use Topics  . Alcohol use: No  . Drug use: No     Family Hx: The patient's family history includes Cancer in her sister; Cancer (age of onset: 65) in her father; Colon cancer in an other family member; Coronary artery disease in her  brother, brother, and sister; Coronary artery disease (age of onset: 19) in her mother; Diabetes in an other family member; Diabetes type II in her sister; Heart disease in her father; Kidney disease in her brother. There is no history of Breast cancer.   Prior CV studies:   The following studies were reviewed today: . Prior Cath & Echo reviewed & PSH updated.  Labs/Other Tests and Data Reviewed:    EKG 04/04/2018:  An ECG dated 04/04/2018 was personally reviewed today and demonstrated:  SR 77, ~R axis deviation.  Non-specific ST-T changes  Recent Labs: 07/01/2017: TSH 1.890 04/04/2018: ALT 36; BUN 11; Creatinine, Ser 0.82; Hemoglobin 10.9; Platelets 334; Potassium 4.5; Sodium 142 ;  Lab Results  Component Value Date   HGBA1C 6.6 (H) 04/04/2018  .  Recent Lipid Panel Lab Results  Component Value Date/Time   CHOL 239 (H) 04/04/2018 11:30 AM   TRIG 54 04/04/2018 11:30 AM   HDL 82 04/04/2018 11:30 AM   CHOLHDL 2.9 04/04/2018 11:30 AM   CHOLHDL 3.6 10/11/2014 11:29 AM   LDLCALC 146 (H) 04/04/2018 11:30 AM    Wt Readings from Last 3 Encounters:  05/13/18 210 lb (95.3 kg)  04/04/18 211 lb 6.4 oz (95.9 kg)  01/17/18 203 lb (92.1 kg)     Objective:    Vital Signs:  Ht  (1.575 m)   Wt 210 lb (95.3 kg)   BMI 38.41 kg/m   VITAL SIGNS:  reviewed GEN:  NAD.  A&Ox 3 RESPIRATORY:  non-laboerd NEURO:  alert and oriented x 3, no obvious focal deficit PSYCH:  normal affect  ASSESSMENT & PLAN:    Problem List Items Addressed This Visit    Chest pain with moderate risk for cardiac etiology    Chest pain in the past and was evaluated 8 years ago with a mildly limited cardiac catheterization. My suspicion is that the symptom is probably musculoskeletal and probably costochondritis, but in the absence of ability to do physical exam, and in light of her potential upcoming surgery, I think is not unreasonable to do an ischemic evaluation.  This should probably be done as part of her  preop evaluation and therefore we will plan for a Lexiscan Myoview.  Try to get this expedited as soon as COVID-19 restrictions are lifted for studies.      Relevant Orders   MYOCARDIAL PERFUSION IMAGING   HYPERTENSION, BENIGN ESSENTIAL (Chronic)    Has previous been well controlled.  Monitored  by PCP. Is on amlodipine and lisinopril.      Preoperative cardiovascular examination    Her surgery is scheduled as a right knee arthroplasty.  This would be considered to be a low risk surgery from a cardiac standpoint.  She has not had a history of stroke or CHF.  She has diabetes, but is not on insulin.  She has normal renal function. Has not had documented CAD, in fact he had a negative/normal cardiac catheterization 8 years ago.  However she is now complaining to me of having intermittent episodes of chest pain that are prolonged at rest and exertion.  (Interestingly this was not mention to her PCP).  My inclination is that this is probably not cardiac chest pain, but with her risk factors not unreasonable to evaluate with a Myoview stress test-since the pain does seem to get worse with some activity..  This will however delay her surgery pending the results.    Unfortunately with COVID-19 restrictions, we are currently not performing elective stress test, I do not think that there is much urgency to do this test given the high likelihood that her surgery will actually be delayed as well.  Plan: Lexiscan Myoview and follow-up after.   .Otherwise my recommendation would be that she is likely to be a low risk patient for low risk surgery recommend proceeding straight to the OR.      Relevant Orders   MYOCARDIAL PERFUSION IMAGING   Type 2 diabetes mellitus with microalbuminuria, without long-term current use of insulin (HCC) (Chronic)    She does not have diabetes.  Is on metformin and not insulin. Appropriately on ACE inhibitor.  A1c relatively well controlled. @ 6.6        He also has  relatively poorly controlled hyperlipidemia, which also increases cardiovascular risk.  Not currently on lipid agent.   COVID-19 Education: The signs and symptoms of COVID-19 were discussed with the patient and how to seek care for testing (follow up with PCP or arrange E-visit).   The importance of social distancing was discussed today.  Time:   Today, I have spent 24 minutes with the patient with telehealth technology discussing the above problems.     Medication Adjustments/Labs and Tests Ordered: Current medicines are reviewed at length with the patient today.  Concerns regarding medicines are outlined above.  Medication Instructions:  None  Tests Ordered: Orders Placed This Encounter  Procedures  . MYOCARDIAL PERFUSION IMAGING  Lexiscan Myoview (will need to see if this can be expedited discussed with COVID-19 restrictions are lifted).  Time sensitive.  Medication Changes: No orders of the defined types were placed in this encounter.   Disposition:  Follow up After stress test complete    Signed, Bryan Lemma, MD  05/13/2018 1:05 PM    [facility]

## 2018-05-13 NOTE — Patient Instructions (Addendum)
Medication Instructions:  NOT NEEDED If you need a refill on your cardiac medications before your next appointment, please call your pharmacy.   Lab work: NOT NEEDED   Testing/Procedures:  WILL SCHEDULE AT [address]  Lexiscan Myoview (will need to see if this can be expedited discussed with COVID-19 restrictions are lifted).  Time sensitive. Your physician has requested that you have a lexiscan myoview. For further information please visit [email or web address] Please follow instruction sheet, as given.    Follow-Up: At [facility], you and your health needs are our priority.  As part of our continuing mission to provide you with exceptional heart care, we have created designated Provider Care Teams.  These Care Teams include your primary Cardiologist (physician) and Advanced Practice Providers (APPs -  Physician Assistants and Nurse Practitioners) who all work together to provide you with the care you need, when you need it. You will need a follow up appointment in   After lexiscan Myoview has been completed.  Please call our office 2 months in advance to schedule this appointment.  You may see Izan [email or web address].MD or one of the following Advanced Practice Providers on your designated Care Team:   Theodore Demark, PA-C . Joni Reining, DNP, ANP  Any Other Special Instructions Will Be Listed Below (If Applicable).

## 2018-05-13 NOTE — Assessment & Plan Note (Signed)
Has previous been well controlled.  Monitored by PCP. Is on amlodipine and lisinopril.

## 2018-05-13 NOTE — Assessment & Plan Note (Signed)
Her surgery is scheduled as a right knee arthroplasty.  This would be considered to be a low risk surgery from a cardiac standpoint.  She has not had a history of stroke or CHF.  She has diabetes, but is not on insulin.  She has normal renal function. Has not had documented CAD, in fact he had a negative/normal cardiac catheterization 8 years ago.  However she is now complaining to me of having intermittent episodes of chest pain that are prolonged at rest and exertion.  (Interestingly this was not mention to her PCP).  My inclination is that this is probably not cardiac chest pain, but with her risk factors not unreasonable to evaluate with a Myoview stress test-since the pain does seem to get worse with some activity..  This will however delay her surgery pending the results.    Unfortunately with COVID-19 restrictions, we are currently not performing elective stress test, I do not think that there is much urgency to do this test given the high likelihood that her surgery will actually be delayed as well.  Plan: Lexiscan Myoview and follow-up after.   .Otherwise my recommendation would be that she is likely to be a low risk patient for low risk surgery recommend proceeding straight to the OR.

## 2018-05-13 NOTE — Assessment & Plan Note (Signed)
She does not have diabetes.  Is on metformin and not insulin. Appropriately on ACE inhibitor.  A1c relatively well controlled. @ 6.6

## 2018-05-13 NOTE — Telephone Encounter (Signed)
Patient is returning a call. °

## 2018-06-03 ENCOUNTER — Telehealth (HOSPITAL_COMMUNITY): Payer: Self-pay

## 2018-06-03 NOTE — Telephone Encounter (Signed)
Encounter complete. 

## 2018-06-08 ENCOUNTER — Ambulatory Visit (HOSPITAL_COMMUNITY)
Admission: RE | Admit: 2018-06-08 | Discharge: 2018-06-08 | Disposition: A | Discharge status: Home / Self Care | Payer: Medicare Other | Source: Ambulatory Visit | Attending: Cardiology | Admitting: Cardiology

## 2018-06-08 ENCOUNTER — Other Ambulatory Visit: Payer: Self-pay

## 2018-06-08 DIAGNOSIS — Z01810 Encounter for preprocedural cardiovascular examination: Secondary | ICD-10-CM | POA: Diagnosis present

## 2018-06-08 DIAGNOSIS — R079 Chest pain, unspecified: Secondary | ICD-10-CM | POA: Insufficient documentation

## 2018-06-08 LAB — MYOCARDIAL PERFUSION IMAGING
LV dias vol: 66 mL (ref 46–106)
LV sys vol: 16 mL
Peak HR: 99 {beats}/min
Rest HR: 84 {beats}/min
SDS: 4
SRS: 1
SSS: 5
TID: 1.36

## 2018-06-08 MED ORDER — REGADENOSON 0.4 MG/5ML IV SOLN
0.4000 mg | Freq: Once | INTRAVENOUS | Status: AC
  Administered 2018-06-08: 0.4 mg via INTRAVENOUS

## 2018-06-08 MED ORDER — TECHNETIUM TC 99M TETROFOSMIN IV KIT
25.1000 | PACK | Freq: Once | INTRAVENOUS | Status: AC | PRN
  Administered 2018-06-08: 25.1 via INTRAVENOUS
  Filled 2018-06-08: qty 26

## 2018-06-08 MED ORDER — TECHNETIUM TC 99M TETROFOSMIN IV KIT
7.9000 | PACK | Freq: Once | INTRAVENOUS | Status: AC | PRN
  Administered 2018-06-08: 7.9 via INTRAVENOUS
  Filled 2018-06-08: qty 8

## 2018-06-11 ENCOUNTER — Other Ambulatory Visit: Payer: Self-pay | Admitting: Physician Assistant

## 2018-06-11 DIAGNOSIS — I10 Essential (primary) hypertension: Secondary | ICD-10-CM

## 2018-06-11 NOTE — Telephone Encounter (Signed)
Requested Prescriptions  Pending Prescriptions Disp Refills  . lisinopril (ZESTRIL) 40 MG tablet [Pharmacy Med Name: LISINOPRIL 40MG  TABLETS] 90 tablet 0    Sig: TAKE 1 TABLET(40 MG) BY MOUTH DAILY     Cardiovascular:  ACE Inhibitors Passed - 06/11/2018  8:56 AM      Passed - Cr in normal range and within 180 days    Creat  Date Value Ref Range Status  07/02/2015 0.95 (H) 0.60 - 0.93 mg/dL Final    Comment:      For patients > or = 81 years of age: The upper reference limit for Creatinine is approximately 13% higher for people identified as African-American.      Creatinine, Ser  Date Value Ref Range Status  04/04/2018 0.82 0.57 - 1.00 mg/dL Final         Passed - K in normal range and within 180 days    Potassium  Date Value Ref Range Status  04/04/2018 4.5 3.5 - 5.2 mmol/L Final         Passed - Patient is not pregnant      Passed - Last BP in normal range    BP Readings from Last 1 Encounters:  04/04/18 138/70         Passed - Valid encounter within last 6 months    Recent Outpatient Visits          2 months ago Pre-operative clearance   [facility], Meda Coffee, MD   5 months ago Type 2 diabetes mellitus with microalbuminuria, without long-term current use of insulin [facility])   [facility], Levell July, MD   7 months ago Chest wall pain   Primary Care at [facility], Madelaine Bhat, PA-C   8 months ago Type 2 diabetes mellitus with microalbuminuria, without long-term current use of insulin [facility])   Primary Care at [facility], [place], PA-C   11 months ago Type 2 diabetes mellitus with microalbuminuria, without long-term current use of insulin [facility])   [facility] at [facility], Levell July, MD      Future Appointments            In 1 month Myles Lipps, MD Primary Care at [place], [facility]         has appt. 07/14/18

## 2018-06-16 ENCOUNTER — Telehealth: Payer: Self-pay | Admitting: Family Medicine

## 2018-06-16 NOTE — Telephone Encounter (Signed)
Copied from CRM [id number]. Topic: General - Other >> Jun 16, 2018  1:54 PM Richarda Blade wrote: Reason for CRM: The patient would like Dr. Leretha Pol to give her a call regarding her upcoming knee surgery and to possibly have her to speak to the knee surgeon about her surgery.

## 2018-06-17 NOTE — Telephone Encounter (Signed)
Spoke with Casey [name] this morning about her concerns and she informed me that she would like to speak with the provider about her knee surgery. She was concerned because  she had not received a call from there office about her knee surgery. After speaking with her I gave the practice that is suppose to do her knee surgery to see if there is anything we needed to send over if anything so someone could give her a call and realized there practice is currently closed. Called Casey [name] back and informed her that the practice is currently closed and that someone from there office will give her a call when there practice opens back up. She verbalized understanding.

## 2018-06-22 ENCOUNTER — Ambulatory Visit: Payer: Medicare Other | Admitting: Family Medicine

## 2018-06-24 ENCOUNTER — Other Ambulatory Visit: Payer: Self-pay | Admitting: Physician Assistant

## 2018-06-24 DIAGNOSIS — H8110 Benign paroxysmal vertigo, unspecified ear: Secondary | ICD-10-CM

## 2018-06-29 NOTE — Telephone Encounter (Signed)
Patient returning call about this request. She states that she has not heard anything back and is requesting call back from PCP or CMA to discuss further.

## 2018-06-29 NOTE — Telephone Encounter (Signed)
Tried to call pt with no answers will try again later.

## 2018-07-11 ENCOUNTER — Other Ambulatory Visit: Payer: Self-pay | Admitting: Physician Assistant

## 2018-07-11 DIAGNOSIS — E1129 Type 2 diabetes mellitus with other diabetic kidney complication: Secondary | ICD-10-CM

## 2018-07-11 DIAGNOSIS — G609 Hereditary and idiopathic neuropathy, unspecified: Secondary | ICD-10-CM

## 2018-07-11 NOTE — Telephone Encounter (Signed)
Would [place] like to take over rxs

## 2018-07-11 NOTE — Telephone Encounter (Signed)
Please Advise  Patient is requesting a refill of the following medications: Requested Prescriptions   Pending Prescriptions Disp Refills  . gabapentin (NEURONTIN) 600 MG tablet [Pharmacy Med Name: GABAPENTIN 600MG  TABLETS] 270 tablet 1    Sig: TAKE 1 TABLET(600 MG) BY MOUTH THREE TIMES DAILY  . metFORMIN (GLUCOPHAGE) 500 MG tablet [Pharmacy Med Name: METFORMIN 500MG  TABLETS] 180 tablet 1    Sig: TAKE 1 TABLET(500 MG) BY MOUTH TWICE DAILY WITH A MEAL

## 2018-07-14 ENCOUNTER — Encounter: Payer: Self-pay | Admitting: Family Medicine

## 2018-07-14 ENCOUNTER — Other Ambulatory Visit: Payer: Self-pay

## 2018-07-14 ENCOUNTER — Ambulatory Visit (INDEPENDENT_AMBULATORY_CARE_PROVIDER_SITE_OTHER): Payer: Medicare Other | Admitting: Family Medicine

## 2018-07-14 VITALS — BP 149/81 | HR 71 | Temp 98.6°F | Ht 62.0 in | Wt 200.0 lb

## 2018-07-14 DIAGNOSIS — F411 Generalized anxiety disorder: Secondary | ICD-10-CM

## 2018-07-14 DIAGNOSIS — I10 Essential (primary) hypertension: Secondary | ICD-10-CM

## 2018-07-14 DIAGNOSIS — M170 Bilateral primary osteoarthritis of knee: Secondary | ICD-10-CM

## 2018-07-14 MED ORDER — AMLODIPINE BESYLATE 5 MG PO TABS: ORAL_TABLET | ORAL | 1 refills | Status: DC

## 2018-07-14 MED ORDER — ALPRAZOLAM 0.25 MG PO TABS: ORAL_TABLET | ORAL | 0 refills | Status: DC

## 2018-07-14 MED ORDER — HYDROCODONE-ACETAMINOPHEN 5-325 MG PO TABS: 1.0000 | ORAL_TABLET | Freq: Four times a day (QID) | ORAL | 0 refills | Status: DC | PRN

## 2018-07-14 MED ORDER — AZELASTINE HCL 0.1 % NA SOLN: NASAL | 3 refills | Status: DC

## 2018-07-14 MED ORDER — AZELASTINE HCL 0.05 % OP SOLN: 1.0000 [drp] | Freq: Two times a day (BID) | OPHTHALMIC | 1 refills | Status: DC

## 2018-07-14 MED ORDER — SERTRALINE HCL 25 MG PO TABS: 25.0000 mg | ORAL_TABLET | Freq: Every day | ORAL | 0 refills | Status: DC

## 2018-07-14 NOTE — Patient Instructions (Signed)
° ° ° °  If you have lab work done today you will be contacted with your lab results within the next 2 weeks.  If you have not heard from us then please contact us. The fastest way to get your results is to register for My Chart. ° ° °IF you received an x-ray today, you will receive an invoice from [facility]. Please contact  Radiology at [phone] with questions or concerns regarding your invoice.  ° °IF you received labwork today, you will receive an invoice from [facility]. Please contact [facility] at [phone] with questions or concerns regarding your invoice.  ° °Our billing staff will not be able to assist you with questions regarding bills from these companies. ° °You will be contacted with the lab results as soon as they are available. The fastest way to get your results is to activate your My Chart account. Instructions are located on the last page of this paperwork. If you have not heard from us regarding the results in 2 weeks, please contact this office. °  ° ° ° °

## 2018-07-14 NOTE — Progress Notes (Signed)
6/25/202010:12 AM  Casey [name] 1935-04-07, 83 y.o., female [id number]  Chief Complaint  Patient presents with  . Hypertension    did not take meds this morn due to how it make her go to the restrm  . Diabetes  . Medication Refill    xanax, norvasc, astelin. optivar. Wants to know why she was only given 45 of the xanax. She is asking for 60    HPI:   Patient is a 83 y.o. female with past medical history significant for DM2 with peripheral neuropathy and microabuminuria, HTN, HLP, OA of knee, vitamin b12 deficiency, osteoporosis, seasonal allergies, who  presents today for followup  Previous PCP Dr Clelia Croft Last OV March for preop clearance of Right TKA Telemedicine visit with cards Dr Herbie Baltimore in April 2020 -  myoview stress test done for preop - low risk, normal LVEF,  R TKA put on hold due to covid 19, has not been rescheduled yet Continues to take vicodin prn, needs refill Last refilled April #120 Does not check cbgs or BP at home Has not taken BP medication this morning Has been on xanax for years, takes it for "my nerves" Having issues with public spaces, driving Has never been prescribed anything else for anxiety Denies any snoring  Lab Results  Component Value Date   HGBA1C 6.6 (H) 04/04/2018   HGBA1C 6.6 (A) 01/03/2018   HGBA1C 6.5 (A) 10/12/2017   Lab Results  Component Value Date   MICROALBUR 0.7 10/11/2014   LDLCALC 146 (H) 04/04/2018   CREATININE 0.82 04/04/2018   GAD 7 : Generalized Anxiety Score 07/14/2018 03/29/2017  Nervous, Anxious, on Edge 3 3  Control/stop worrying 1 2  Worry too much - different things 1 2  Trouble relaxing 1 1  Restless 1 0  Easily annoyed or irritable 0 2  Afraid - awful might happen 0 0  Total GAD 7 Score 7 10  Anxiety Difficulty Somewhat difficult Not difficult at all     Depression screen [facility] 2/9 07/14/2018 07/14/2018 04/04/2018  Decreased Interest 1 0 0  Down, Depressed, Hopeless 1 0 0  PHQ - 2 Score 2 0 0  Altered  sleeping 2 - -  Tired, decreased energy 3 - -  Change in appetite 0 - -  Feeling bad or failure about yourself  0 - -  Trouble concentrating 1 - -  Moving slowly or fidgety/restless 0 - -  Suicidal thoughts 0 - -  PHQ-9 Score 8 - -  Difficult doing work/chores Somewhat difficult - -    Fall Risk  07/14/2018 04/04/2018 01/03/2018 11/11/2017 07/01/2017  Falls in the past year? 0 0 0 No No  Number falls in past yr: 0 0 0 - -  Injury with Fall? 0 0 0 - -  Follow up - Falls evaluation completed - - -     No Known Allergies  Prior to Admission medications   Medication Sig Start Date End Date Taking? Authorizing Provider  ALPRAZolam (XANAX) 0.25 MG tablet TAKE 1 TO 2 TABLETS(0.25 TO 0.5 MG) BY MOUTH AT BEDTIME AS NEEDED FOR ANXIETY 05/19/18  Yes Myles Lipps, MD  amLODipine (NORVASC) 5 MG tablet TAKE 1 TABLET(5 MG) BY MOUTH DAILY 05/12/18  Yes McVey, Madelaine Bhat, PA-C  aspirin EC 81 MG tablet Take 1 tablet (81 mg total) by mouth daily. 08/21/15  Yes Sherren Mocha, MD  azelastine (ASTELIN) 0.1 % nasal spray USE 1 SPRAY IN EACH NOSTRIL TWICE DAILY AS DIRECTED 04/04/18  Yes Myles Lipps, MD  azelastine (OPTIVAR) 0.05 % ophthalmic solution INSTILL 1 DROP IN BOTH EYES TWICE DAILY 02/11/18  Yes Collie Siad A, MD  FLUAD 0.5 ML SUSY ADM 0.5ML IM UTD 09/23/17  Yes [provider]  gabapentin (NEURONTIN) 600 MG tablet TAKE 1 TABLET(600 MG) BY MOUTH THREE TIMES DAILY 07/12/18  Yes Myles Lipps, MD  glucose blood test strip 1 each by Other route daily. Use as instructed 09/08/16  Yes Sherren Mocha, MD  HYDROcodone-acetaminophen (NORCO/VICODIN) 5-325 MG tablet Take 1 tablet by mouth every 6 (six) hours as needed for moderate pain. For chronic pain 05/11/18  Yes Myles Lipps, MD  lisinopril (ZESTRIL) 40 MG tablet TAKE 1 TABLET(40 MG) BY MOUTH DAILY 06/11/18  Yes Myles Lipps, MD  meclizine (ANTIVERT) 25 MG tablet TAKE 1 TABLET(25 MG) BY MOUTH THREE TIMES DAILY 06/27/18  Yes Myles Lipps, MD  metFORMIN (GLUCOPHAGE) 500 MG tablet TAKE 1 TABLET(500 MG) BY MOUTH TWICE DAILY WITH A MEAL 07/12/18  Yes Myles Lipps, MD  omeprazole (PRILOSEC) 40 MG capsule TAKE 1 CAPSULE BY MOUTH ONCE DAILY 03/07/18  Yes Sherren Mocha, MD    Past Medical History:  Diagnosis Date  . ALLERGIC RHINITIS   . Allergy   . ANXIETY   . ARM PAIN, RIGHT   . BENIGN POSITIONAL VERTIGO   . Carcinoma in situ of colon   . CONSTIPATION   . CYSTOCELE WITH INCOMPLETE UTERINE PROLAPSE   . ESOPHAGEAL STRICTURE   . FATIGUE   . FLANK PAIN, LEFT   . Headache(784.0)   . HIP PAIN, LEFT   . HYPERCHOLESTEROLEMIA   . HYPERGLYCEMIA   . HYPERTENSION, BENIGN ESSENTIAL 03/03/2007  . LEG EDEMA, BILATERAL   . Microscopic hematuria   . OBESITY   . OSTEOARTHRITIS, KNEE   . OSTEOPOROSIS   . Peripheral neuropathy, idiopathic   . Problems with swallowing and mastication   . SINUSITIS, RECURRENT   . SPRAIN&STRAIN OTH SPEC SITES SHOULDER&UPPER ARM     Past Surgical History:  Procedure Laterality Date  . LEFT HEART CATHETERIZATION WITH CORONARY ANGIOGRAM N/A 12/02/2010   Procedure: LEFT HEART CATHETERIZATION WITH CORONARY ANGIOGRAM;  Surgeon: Peter M Swaziland, MD;  Location: [facility] CATH LAB;  Service: Cardiovascular;:: NORMAL CORONARY ARTERIES - NORMAL LV FUNCTION & EDP.    Marland Kitchen TRANSTHORACIC ECHOCARDIOGRAM  11/2010   Normal EF 60-65%. No RWMA.  Mild LAD dilation c/w Gr 1 DD.  -- PRETTY NORMAL ECHO    Social History   Tobacco Use  . Smoking status: Never Smoker  . Smokeless tobacco: Never Used  Substance Use Topics  . Alcohol use: No    Family History  Problem Relation Age of Onset  . Coronary artery disease Mother 40  . Cancer Father 57  . Heart disease Father   . Colon cancer Other   . Diabetes Other   . Coronary artery disease Sister   . Diabetes type II Sister   . Cancer Sister   . Coronary artery disease Brother   . Kidney disease Brother   . Coronary artery disease Brother   . Breast cancer Neg Hx      Review of Systems  Constitutional: Negative for chills and fever.  Respiratory: Negative for cough and shortness of breath.   Cardiovascular: Negative for chest pain, palpitations and leg swelling.  Gastrointestinal: Negative for abdominal pain, nausea and vomiting.     OBJECTIVE:  Today's Vitals   07/14/18 0951  BP: Marland Kitchen)  149/81  Pulse: 71  Temp: 98.6 F (37 C)  TempSrc: Oral  SpO2: 99%  Weight: 200 lb (90.7 kg)  Height: 5\' 2"  (1.575 m)   Body mass index is 36.58 kg/m.  BP Readings from Last 3 Encounters:  07/14/18 (!) 149/81  04/04/18 138/70  01/17/18 (!) 154/80    Physical Exam Vitals signs and nursing note reviewed.  Constitutional:      Appearance: She is well-developed.  HENT:     Head: Normocephalic and atraumatic.     Mouth/Throat:     Pharynx: No oropharyngeal exudate.  Eyes:     General: No scleral icterus.    Conjunctiva/sclera: Conjunctivae normal.     Pupils: Pupils are equal, round, and reactive to light.  Neck:     Musculoskeletal: Neck supple.  Cardiovascular:     Rate and Rhythm: Normal rate and regular rhythm.     Heart sounds: Normal heart sounds. No murmur. No friction rub. No gallop.   Pulmonary:     Effort: Pulmonary effort is normal.     Breath sounds: Normal breath sounds. No wheezing or rales.  Skin:    General: Skin is warm and dry.  Neurological:     Mental Status: She is alert and oriented to person, place, and time.     ASSESSMENT and PLAN  1. HYPERTENSION, BENIGN ESSENTIAL Above goal in setting of not taken her bp meds this morning. Previous readings at goal. Continue current regime. Reassess at next visit. - amLODipine (NORVASC) 5 MG tablet; TAKE 1 TABLET(5 MG) BY MOUTH DAILY  2. GAD (generalized anxiety disorder) Not controlled. Discussed with patient treatment options. Discussed concerns for daily bzd use. Starting sertraline, reviewed r/se/b, titrate to effect. Discussed intention to weaning down to prn bzd use. pmp  reviewed - ALPRAZolam (XANAX) 0.25 MG tablet; TAKE 1 TO 2 TABLETS(0.25 TO 0.5 MG) BY MOUTH AT BEDTIME AS NEEDED FOR ANXIETY  3. Primary osteoarthritis of both knees Pending surgery. Advised she reach out to ortho as opening OR. Refilled vicodin, reviewed r/se/b. Continue prn use. pmp reviewed.  - HYDROcodone-acetaminophen (NORCO/VICODIN) 5-325 MG tablet; Take 1 tablet by mouth every 6 (six) hours as needed for moderate pain. For chronic pain  Other orders - azelastine (OPTIVAR) 0.05 % ophthalmic solution; Place 1 drop into both eyes 2 (two) times daily. - azelastine (ASTELIN) 0.1 % nasal spray; USE 1 SPRAY IN EACH NOSTRIL TWICE DAILY AS DIRECTED - sertraline (ZOLOFT) 25 MG tablet; Take 1 tablet (25 mg total) by mouth daily.  Return in about 4 weeks (around 08/11/2018) for anxiety.    Myles Lipps, MD Primary Care at [facility] [address] [place], [place] [address] Ph.  [phone] Fax [phone]

## 2018-08-09 ENCOUNTER — Encounter: Payer: Self-pay | Admitting: Family Medicine

## 2018-08-09 ENCOUNTER — Ambulatory Visit (INDEPENDENT_AMBULATORY_CARE_PROVIDER_SITE_OTHER): Payer: Medicare Other | Admitting: Family Medicine

## 2018-08-09 ENCOUNTER — Other Ambulatory Visit: Payer: Self-pay

## 2018-08-09 VITALS — BP 130/62 | HR 75 | Temp 98.2°F | Ht 62.0 in | Wt 203.0 lb

## 2018-08-09 DIAGNOSIS — H8110 Benign paroxysmal vertigo, unspecified ear: Secondary | ICD-10-CM

## 2018-08-09 DIAGNOSIS — I10 Essential (primary) hypertension: Secondary | ICD-10-CM

## 2018-08-09 DIAGNOSIS — F411 Generalized anxiety disorder: Secondary | ICD-10-CM | POA: Diagnosis not present

## 2018-08-09 MED ORDER — ALPRAZOLAM 0.25 MG PO TABS: ORAL_TABLET | ORAL | 0 refills | Status: DC

## 2018-08-09 MED ORDER — ESCITALOPRAM OXALATE 10 MG PO TABS: 10.0000 mg | ORAL_TABLET | Freq: Every day | ORAL | 2 refills | Status: DC

## 2018-08-09 MED ORDER — MECLIZINE HCL 25 MG PO TABS: 25.0000 mg | ORAL_TABLET | Freq: Three times a day (TID) | ORAL | 1 refills | Status: DC | PRN

## 2018-08-09 NOTE — Progress Notes (Signed)
7/21/202011:02 AM  Casey [name] 1935-10-08, 83 y.o., female [id number]  Chief Complaint  Patient presents with  . Anxiety    screenings complete, needs refill on meclizine  . Depression    HPI:   Patient is a 83 y.o. female with past medical history significant for DM2 with peripheral neuropathy and microabuminuria, HTN, HLP, OA of knee, vitamin b12 deficiency, osteoporosis, seasonal allergies, who  presents today for followup  Last OV June 2020 BP was elevated, recheck at next visit Started sertraline 25mg  once a day Stopped taking because it was causing her to shake Used to be on lexapro, patient stopped in 2018 as felt her depression was fine, per chart notes pmp reviewed gad7 and phq9 noted  Took her BP meds this morning  GAD 7 : Generalized Anxiety Score 08/09/2018 07/14/2018 03/29/2017  Nervous, Anxious, on Edge 3 3 3   Control/stop worrying 3 1 2   Worry too much - different things 3 1 2   Trouble relaxing 3 1 1   Restless 0 1 0  Easily annoyed or irritable 0 0 2  Afraid - awful might happen 3 0 0  Total GAD 7 Score 15 7 10   Anxiety Difficulty - Somewhat difficult Not difficult at all     Depression screen [facility] 2/9 08/09/2018 07/14/2018 07/14/2018  Decreased Interest 1 1 0  Down, Depressed, Hopeless 1 1 0  PHQ - 2 Score 2 2 0  Altered sleeping 1 2 -  Tired, decreased energy 1 3 -  Change in appetite 2 0 -  Feeling bad or failure about yourself  0 0 -  Trouble concentrating 2 1 -  Moving slowly or fidgety/restless 0 0 -  Suicidal thoughts 0 0 -  PHQ-9 Score 8 8 -  Difficult doing work/chores - Somewhat difficult -  Some recent data might be hidden    Fall Risk  08/09/2018 07/14/2018 04/04/2018 01/03/2018 11/11/2017  Falls in the past year? 0 0 0 0 No  Number falls in past yr: 0 0 0 0 -  Injury with Fall? 0 0 0 0 -  Follow up - - Falls evaluation completed - -     No Known Allergies  Prior to Admission medications   Medication Sig Start Date End Date  Taking? Authorizing Provider  ALPRAZolam (XANAX) 0.25 MG tablet TAKE 1 TO 2 TABLETS(0.25 TO 0.5 MG) BY MOUTH AT BEDTIME AS NEEDED FOR ANXIETY 07/14/18  Yes Myles Lipps, MD  amLODipine (NORVASC) 5 MG tablet TAKE 1 TABLET(5 MG) BY MOUTH DAILY 07/14/18  Yes Myles Lipps, MD  aspirin EC 81 MG tablet Take 1 tablet (81 mg total) by mouth daily. 08/21/15  Yes Sherren Mocha, MD  azelastine (ASTELIN) 0.1 % nasal spray USE 1 SPRAY IN EACH NOSTRIL TWICE DAILY AS DIRECTED 07/14/18  Yes Myles Lipps, MD  azelastine (OPTIVAR) 0.05 % ophthalmic solution Place 1 drop into both eyes 2 (two) times daily. 07/14/18  Yes Myles Lipps, MD  FLUAD 0.5 ML SUSY ADM 0.5ML IM UTD 09/23/17  Yes [provider]  gabapentin (NEURONTIN) 600 MG tablet TAKE 1 TABLET(600 MG) BY MOUTH THREE TIMES DAILY 07/12/18  Yes Myles Lipps, MD  glucose blood test strip 1 each by Other route daily. Use as instructed 09/08/16  Yes Sherren Mocha, MD  HYDROcodone-acetaminophen (NORCO/VICODIN) 5-325 MG tablet Take 1 tablet by mouth every 6 (six) hours as needed for moderate pain. For chronic pain 07/14/18  Yes Myles Lipps, MD  lisinopril (ZESTRIL) 40 MG tablet TAKE 1 TABLET(40 MG) BY MOUTH DAILY 06/11/18  Yes Myles Lipps, MD  meclizine (ANTIVERT) 25 MG tablet TAKE 1 TABLET(25 MG) BY MOUTH THREE TIMES DAILY 06/27/18  Yes Myles Lipps, MD  metFORMIN (GLUCOPHAGE) 500 MG tablet TAKE 1 TABLET(500 MG) BY MOUTH TWICE DAILY WITH A MEAL 07/12/18  Yes Myles Lipps, MD  omeprazole (PRILOSEC) 40 MG capsule TAKE 1 CAPSULE BY MOUTH ONCE DAILY 03/07/18  Yes Sherren Mocha, MD  sertraline (ZOLOFT) 25 MG tablet Take 1 tablet (25 mg total) by mouth daily. 07/14/18  Yes Myles Lipps, MD    Past Medical History:  Diagnosis Date  . ALLERGIC RHINITIS   . Allergy   . ANXIETY   . ARM PAIN, RIGHT   . BENIGN POSITIONAL VERTIGO   . Carcinoma in situ of colon   . CONSTIPATION   . CYSTOCELE WITH INCOMPLETE UTERINE PROLAPSE   . ESOPHAGEAL  STRICTURE   . FATIGUE   . FLANK PAIN, LEFT   . Headache(784.0)   . HIP PAIN, LEFT   . HYPERCHOLESTEROLEMIA   . HYPERGLYCEMIA   . HYPERTENSION, BENIGN ESSENTIAL 03/03/2007  . LEG EDEMA, BILATERAL   . Microscopic hematuria   . OBESITY   . OSTEOARTHRITIS, KNEE   . OSTEOPOROSIS   . Peripheral neuropathy, idiopathic   . Problems with swallowing and mastication   . SINUSITIS, RECURRENT   . SPRAIN&STRAIN OTH SPEC SITES SHOULDER&UPPER ARM     Past Surgical History:  Procedure Laterality Date  . LEFT HEART CATHETERIZATION WITH CORONARY ANGIOGRAM N/A 12/02/2010   Procedure: LEFT HEART CATHETERIZATION WITH CORONARY ANGIOGRAM;  Surgeon: Peter M Swaziland, MD;  Location: [facility] CATH LAB;  Service: Cardiovascular;:: NORMAL CORONARY ARTERIES - NORMAL LV FUNCTION & EDP.    Marland Kitchen TRANSTHORACIC ECHOCARDIOGRAM  11/2010   Normal EF 60-65%. No RWMA.  Mild LAD dilation c/w Gr 1 DD.  -- PRETTY NORMAL ECHO    Social History   Tobacco Use  . Smoking status: Never Smoker  . Smokeless tobacco: Never Used  Substance Use Topics  . Alcohol use: No    Family History  Problem Relation Age of Onset  . Coronary artery disease Mother 51  . Cancer Father 71  . Heart disease Father   . Colon cancer Other   . Diabetes Other   . Coronary artery disease Sister   . Diabetes type II Sister   . Cancer Sister   . Coronary artery disease Brother   . Kidney disease Brother   . Coronary artery disease Brother   . Breast cancer Neg Hx     ROS Per hpi  OBJECTIVE:  Today's Vitals   08/09/18 1024  BP: 130/62  Pulse: 75  Temp: 98.2 F (36.8 C)  TempSrc: Oral  SpO2: 96%  Weight: 203 lb (92.1 kg)  Height: 5\' 2"  (1.575 m)   Body mass index is 37.13 kg/m.   Physical Exam Vitals signs and nursing note reviewed.  Constitutional:      Appearance: She is well-developed.  HENT:     Head: Normocephalic and atraumatic.  Eyes:     General: No scleral icterus.    Conjunctiva/sclera: Conjunctivae normal.      Pupils: Pupils are equal, round, and reactive to light.  Neck:     Musculoskeletal: Neck supple.  Pulmonary:     Effort: Pulmonary effort is normal.  Skin:    General: Skin is warm and dry.  Neurological:  Mental Status: She is alert and oriented to person, place, and time.     ASSESSMENT and PLAN  1. GAD (generalized anxiety disorder) Not controlled. Intolerant of sertraline. Starting lexapro which she has used in the past. Reviewed r/se/b. Xanax refilled, pmp reviewed. Discussed again importance of prn use.  - ALPRAZolam (XANAX) 0.25 MG tablet; TAKE 1 TO 2 TABLETS(0.25 TO 0.5 MG) BY MOUTH AT BEDTIME AS NEEDED FOR ANXIETY  2. HYPERTENSION, BENIGN ESSENTIAL Controlled. Continue current regime.   3. Benign paroxysmal positional vertigo, unspecified laterality - meclizine (ANTIVERT) 25 MG tablet; Take 1 tablet (25 mg total) by mouth 3 (three) times daily as needed for dizziness.  Other orders - escitalopram (LEXAPRO) 10 MG tablet; Take 1 tablet (10 mg total) by mouth daily.  Return in about 4 weeks (around 09/06/2018).    Myles Lipps, MD Primary Care at [facility] [address] [place], [place] [address] Ph.  [phone] Fax [phone]

## 2018-08-09 NOTE — Patient Instructions (Signed)
° ° ° °  If you have lab work done today you will be contacted with your lab results within the next 2 weeks.  If you have not heard from us then please contact us. The fastest way to get your results is to register for My Chart. ° ° °IF you received an x-ray today, you will receive an invoice from [facility]. Please contact  Radiology at [phone] with questions or concerns regarding your invoice.  ° °IF you received labwork today, you will receive an invoice from [facility]. Please contact [facility] at [phone] with questions or concerns regarding your invoice.  ° °Our billing staff will not be able to assist you with questions regarding bills from these companies. ° °You will be contacted with the lab results as soon as they are available. The fastest way to get your results is to activate your My Chart account. Instructions are located on the last page of this paperwork. If you have not heard from us regarding the results in 2 weeks, please contact this office. °  ° ° ° °

## 2018-08-11 ENCOUNTER — Other Ambulatory Visit: Payer: Self-pay | Admitting: Family Medicine

## 2018-08-11 NOTE — Telephone Encounter (Signed)
Requested Prescriptions  Pending Prescriptions Disp Refills  . azelastine (OPTIVAR) 0.05 % ophthalmic solution [Pharmacy Med Name: AZELASTINE 0.05% OPHTH SOLUTION 6ML] 6 mL 1    Sig: INSTILL 1 DROP IN BOTH EYES TWICE DAILY     Ophthalmology:  Antiallergy Passed - 08/11/2018  1:49 PM      Passed - Valid encounter within last 12 months    Recent Outpatient Visits          2 days ago GAD (generalized anxiety disorder)   [facility], Lilia Argue, MD   4 weeks ago HYPERTENSION, BENIGN ESSENTIAL   [facility], Lilia Argue, MD   4 months ago Pre-operative clearance   [facility], Lilia Argue, MD   7 months ago Type 2 diabetes mellitus with microalbuminuria, without long-term current use of insulin [facility])   [facility] at [facility], Laurey Arrow, MD   9 months ago Chest wall pain   Primary Care at [facility], Gelene Mink, PA-C      Future Appointments            In 4 weeks Rutherford Guys, MD Primary Care at [place], [facility]

## 2018-09-08 ENCOUNTER — Encounter: Payer: Self-pay | Admitting: Family Medicine

## 2018-09-08 ENCOUNTER — Ambulatory Visit (INDEPENDENT_AMBULATORY_CARE_PROVIDER_SITE_OTHER): Payer: Medicare Other | Admitting: Family Medicine

## 2018-09-08 ENCOUNTER — Other Ambulatory Visit: Payer: Self-pay

## 2018-09-08 VITALS — BP 132/76 | HR 70 | Temp 97.9°F | Ht 62.0 in | Wt 205.0 lb

## 2018-09-08 DIAGNOSIS — J0110 Acute frontal sinusitis, unspecified: Secondary | ICD-10-CM

## 2018-09-08 DIAGNOSIS — I10 Essential (primary) hypertension: Secondary | ICD-10-CM | POA: Diagnosis not present

## 2018-09-08 DIAGNOSIS — F411 Generalized anxiety disorder: Secondary | ICD-10-CM | POA: Diagnosis not present

## 2018-09-08 MED ORDER — FLUTICASONE PROPIONATE 50 MCG/ACT NA SUSP: 1.0000 | Freq: Two times a day (BID) | NASAL | 6 refills | Status: DC

## 2018-09-08 MED ORDER — AMOXICILLIN-POT CLAVULANATE 875-125 MG PO TABS: 1.0000 | ORAL_TABLET | Freq: Two times a day (BID) | ORAL | 0 refills | Status: DC

## 2018-09-08 MED ORDER — ESCITALOPRAM OXALATE 20 MG PO TABS: 20.0000 mg | ORAL_TABLET | Freq: Every day | ORAL | 1 refills | Status: DC

## 2018-09-08 NOTE — Progress Notes (Signed)
8/20/202011:46 AM  Casey [name] Jul 17, 1935, 83 y.o., female [id number]  Chief Complaint  Patient presents with  . Hypertension  . Diabetes  . Pain    between the eyes, has allergy.     HPI:   Patient is a 83 y.o. female with past medical history significant for DM2 with peripheral neuropathy and microabuminuria, HTN, HLP, OA of knee, vitamin b12 deficiency, osteoporosis, seasonal allergies,who presents today for followup on her anxiety and sinus pressure  Last OV July 2020 Started on lexapro Tolerating well but anxiety not well controlled yet  GAD 7 : Generalized Anxiety Score 09/08/2018 08/09/2018 07/14/2018 03/29/2017  Nervous, Anxious, on Edge 2 3 3 3   Control/stop worrying 3 3 1 2   Worry too much - different things 3 3 1 2   Trouble relaxing 3 3 1 1   Restless 1 0 1 0  Easily annoyed or irritable 2 0 0 2  Afraid - awful might happen 2 3 0 0  Total GAD 7 Score 16 15 7 10   Anxiety Difficulty Somewhat difficult - Somewhat difficult Not difficult at all     Patient reports sinus pressure and pain x 3 weeks Mostly forehead and left eye swollen Denies any itchy eyes or crying Has not been taking anything for her allergies as has not had any sx Nasal congestion but no rhinorrhea Causing dizziness and ear pressure No fever or chills, cough, SOB, CP   [facility] 09/08/2018 08/09/2018 07/14/2018  Decreased Interest 0 1 1  Down, Depressed, Hopeless 0 1 1  PHQ - 2 Score 0 2 2  Altered sleeping - 1 2  Tired, decreased energy - 1 3  Change in appetite - 2 0  Feeling bad or failure about yourself  - 0 0  Trouble concentrating - 2 1  Moving slowly or fidgety/restless - 0 0  Suicidal thoughts - 0 0  PHQ-9 Score - 8 8  Difficult doing work/chores - - Somewhat difficult  Some recent data might be hidden    Fall Risk  09/08/2018 08/09/2018 07/14/2018 04/04/2018 01/03/2018  Falls in the past year? 0 0 0 0 0  Number falls in past yr: 0 0 0 0 0  Injury with Fall? 0  0 0 0 0  Follow up - - - Falls evaluation completed -     No Known Allergies  Prior to Admission medications   Medication Sig Start Date End Date Taking? Authorizing Provider  ALPRAZolam (XANAX) 0.25 MG tablet TAKE 1 TO 2 TABLETS(0.25 TO 0.5 MG) BY MOUTH AT BEDTIME AS NEEDED FOR ANXIETY 08/09/18  Yes Myles LippsSantiago, Irma M, MD  amLODipine (NORVASC) 5 MG tablet TAKE 1 TABLET(5 MG) BY MOUTH DAILY 07/14/18  Yes Myles LippsSantiago, Irma M, MD  aspirin EC 81 MG tablet Take 1 tablet (81 mg total) by mouth daily. 08/21/15  Yes Sherren MochaShaw, Eva N, MD  azelastine (ASTELIN) 0.1 % nasal spray USE 1 SPRAY IN EACH NOSTRIL TWICE DAILY AS DIRECTED 07/14/18  Yes Myles LippsSantiago, Irma M, MD  azelastine (OPTIVAR) 0.05 % ophthalmic solution INSTILL 1 DROP IN BOTH EYES TWICE DAILY 08/11/18  Yes Myles LippsSantiago, Irma M, MD  escitalopram (LEXAPRO) 10 MG tablet Take 1 tablet (10 mg total) by mouth daily. 08/09/18  Yes Myles LippsSantiago, Irma M, MD  FLUAD 0.5 ML SUSY ADM 0.5ML IM UTD 09/23/17  Yes [provider]  gabapentin (NEURONTIN) 600 MG tablet TAKE 1 TABLET(600 MG) BY MOUTH THREE TIMES DAILY 07/12/18  Yes Myles LippsSantiago, Irma M, MD  glucose blood test strip 1 each by Other route daily. Use as instructed 09/08/16  Yes [name], MD  HYDROcodone-acetaminophen (NORCO/VICODIN) 5-325 MG tablet Take 1 tablet by mouth every 6 (six) hours as needed for moderate pain. For chronic pain 07/14/18  Yes Rutherford Guys, MD  lisinopril (ZESTRIL) 40 MG tablet TAKE 1 TABLET(40 MG) BY MOUTH DAILY 06/11/18  Yes Rutherford Guys, MD  meclizine (ANTIVERT) 25 MG tablet Take 1 tablet (25 mg total) by mouth 3 (three) times daily as needed for dizziness. 08/09/18  Yes Rutherford Guys, MD  metFORMIN (GLUCOPHAGE) 500 MG tablet TAKE 1 TABLET(500 MG) BY MOUTH TWICE DAILY WITH A MEAL 07/12/18  Yes Rutherford Guys, MD  omeprazole (PRILOSEC) 40 MG capsule TAKE 1 CAPSULE BY MOUTH ONCE DAILY 03/07/18  Yes [name], MD    Past Medical History:  Diagnosis Date  . ALLERGIC RHINITIS   .  Allergy   . ANXIETY   . ARM PAIN, RIGHT   . BENIGN POSITIONAL VERTIGO   . Carcinoma in situ of colon   . CONSTIPATION   . CYSTOCELE WITH INCOMPLETE UTERINE PROLAPSE   . ESOPHAGEAL STRICTURE   . FATIGUE   . FLANK PAIN, LEFT   . Headache(784.0)   . HIP PAIN, LEFT   . HYPERCHOLESTEROLEMIA   . HYPERGLYCEMIA   . HYPERTENSION, BENIGN ESSENTIAL 03/03/2007  . LEG EDEMA, BILATERAL   . Microscopic hematuria   . OBESITY   . OSTEOARTHRITIS, KNEE   . OSTEOPOROSIS   . Peripheral neuropathy, idiopathic   . Problems with swallowing and mastication   . SINUSITIS, RECURRENT   . SPRAIN&STRAIN OTH SPEC SITES SHOULDER&UPPER ARM     Past Surgical History:  Procedure Laterality Date  . LEFT HEART CATHETERIZATION WITH CORONARY ANGIOGRAM N/A 12/02/2010   Procedure: LEFT HEART CATHETERIZATION WITH CORONARY ANGIOGRAM;  Surgeon: Peter M Martinique, MD;  Location: [facility] CATH LAB;  Service: Cardiovascular;:: NORMAL CORONARY ARTERIES - NORMAL LV FUNCTION & EDP.    Marland Kitchen TRANSTHORACIC ECHOCARDIOGRAM  11/2010   Normal EF 60-65%. No RWMA.  Mild LAD dilation c/w Gr 1 DD.  -- PRETTY NORMAL ECHO    Social History   Tobacco Use  . Smoking status: Never Smoker  . Smokeless tobacco: Never Used  Substance Use Topics  . Alcohol use: No    Family History  Problem Relation Age of Onset  . Coronary artery disease Mother 74  . Cancer Father 44  . Heart disease Father   . Colon cancer Other   . Diabetes Other   . Coronary artery disease Sister   . Diabetes type II Sister   . Cancer Sister   . Coronary artery disease Brother   . Kidney disease Brother   . Coronary artery disease Brother   . Breast cancer Neg Hx     ROS Per hpi  OBJECTIVE:  Today's Vitals   09/08/18 1131 09/08/18 1155  BP: (!) 142/72 132/76  Pulse: 70   Temp: 97.9 F (36.6 C)   TempSrc: Oral   SpO2: 100%   Weight: 205 lb (93 kg)   Height: 5\' 2"  (1.575 m)    Body mass index is 37.49 kg/m.   Physical Exam Vitals signs and nursing  note reviewed.  Constitutional:      Appearance: She is well-developed.  HENT:     Head: Normocephalic and atraumatic.     Right Ear: Hearing, tympanic membrane, ear canal and external ear normal.     Left  Ear: Hearing, tympanic membrane, ear canal and external ear normal.     Nose:     Right Sinus: Frontal sinus tenderness present.     Left Sinus: Frontal sinus tenderness present.  Eyes:     Conjunctiva/sclera: Conjunctivae normal.     Pupils: Pupils are equal, round, and reactive to light.  Neck:     Musculoskeletal: Neck supple.  Cardiovascular:     Rate and Rhythm: Normal rate and regular rhythm.     Heart sounds: Normal heart sounds. No murmur. No friction rub. No gallop.   Pulmonary:     Effort: Pulmonary effort is normal.     Breath sounds: Normal breath sounds. No wheezing or rales.  Lymphadenopathy:     Cervical: No cervical adenopathy.  Skin:    General: Skin is warm and dry.  Neurological:     Mental Status: She is alert and oriented to person, place, and time.     No results found for this or any previous visit (from the past 24 hour(s)).  No results found.   ASSESSMENT and PLAN  1. GAD (generalized anxiety disorder) Not controlled. Increase lexapro. Cont with xanax prn  2. Acute non-recurrent frontal sinusitis Discussed supportive measures, new meds r/se/b and RTC precautions. Patient educational handout given.  3. HYPERTENSION, BENIGN ESSENTIAL Controlled. Continue current regime.  - Care order/instruction:  Other orders - escitalopram (LEXAPRO) 20 MG tablet; Take 1 tablet (20 mg total) by mouth daily. - fluticasone (FLONASE) 50 MCG/ACT nasal spray; Place 1 spray into both nostrils 2 (two) times daily. - amoxicillin-clavulanate (AUGMENTIN) 875-125 MG tablet; Take 1 tablet by mouth 2 (two) times daily.  Return in about 6 weeks (around 10/20/2018).    Myles LippsIrma M Santiago, MD [facility] [address] [place], [place] [address] Ph.   [phone] Fax [phone]

## 2018-09-08 NOTE — Patient Instructions (Addendum)
   If you have lab work done today you will be contacted with your lab results within the next 2 weeks.  If you have not heard from us then please contact us. The fastest way to get your results is to register for My Chart.   IF you received an x-ray today, you will receive an invoice from [facility]. Please contact [facility] at [phone] with questions or concerns regarding your invoice.   IF you received labwork today, you will receive an invoice from [facility]. Please contact [facility] at [phone] with questions or concerns regarding your invoice.   Our billing staff will not be able to assist you with questions regarding bills from these companies.  You will be contacted with the lab results as soon as they are available. The fastest way to get your results is to activate your My Chart account. Instructions are located on the last page of this paperwork. If you have not heard from us regarding the results in 2 weeks, please contact this office.     Sinusitis, Adult Sinusitis is soreness and swelling (inflammation) of your sinuses. Sinuses are hollow spaces in the bones around your face. They are located:  Around your eyes.  In the middle of your forehead.  Behind your nose.  In your cheekbones. Your sinuses and nasal passages are lined with a fluid called mucus. Mucus drains out of your sinuses. Swelling can trap mucus in your sinuses. This lets germs (bacteria, virus, or fungus) grow, which leads to infection. Most of the time, this condition is caused by a virus. What are the causes? This condition is caused by:  Allergies.  Asthma.  Germs.  Things that block your nose or sinuses.  Growths in the nose (nasal polyps).  Chemicals or irritants in the air.  Fungus (rare). What increases the risk? You are more likely to develop this condition if:  You have a weak body defense system (immune system).  You do a lot of swimming or  diving.  You use nasal sprays too much.  You smoke. What are the signs or symptoms? The main symptoms of this condition are pain and a feeling of pressure around the sinuses. Other symptoms include:  Stuffy nose (congestion).  Runny nose (drainage).  Swelling and warmth in the sinuses.  Headache.  Toothache.  A cough that may get worse at night.  Mucus that collects in the throat or the back of the nose (postnasal drip).  Being unable to smell and taste.  Being very tired (fatigue).  A fever.  Sore throat.  Bad breath. How is this diagnosed? This condition is diagnosed based on:  Your symptoms.  Your medical history.  A physical exam.  Tests to find out if your condition is short-term (acute) or long-term (chronic). Your doctor may: ? Check your nose for growths (polyps). ? Check your sinuses using a tool that has a light (endoscope). ? Check for allergies or germs. ? Do imaging tests, such as an MRI or CT scan. How is this treated? Treatment for this condition depends on the cause and whether it is short-term or long-term.  If caused by a virus, your symptoms should go away on their own within 10 days. You may be given medicines to relieve symptoms. They include: ? Medicines that shrink swollen tissue in the nose. ? Medicines that treat allergies (antihistamines). ? A spray that treats swelling of the nostrils. ? Rinses that help get rid of thick mucus in your   nose (nasal saline washes).  If caused by bacteria, your doctor may wait to see if you will get better without treatment. You may be given antibiotic medicine if you have: ? A very bad infection. ? A weak body defense system.  If caused by growths in the nose, you may need to have surgery. Follow these instructions at home: Medicines  Take, use, or apply over-the-counter and prescription medicines only as told by your doctor. These may include nasal sprays.  If you were prescribed an antibiotic  medicine, take it as told by your doctor. Do not stop taking the antibiotic even if you start to feel better. Hydrate and humidify   Drink enough water to keep your pee (urine) pale yellow.  Use a cool mist humidifier to keep the humidity level in your home above 50%.  Breathe in steam for 10-15 minutes, 3-4 times a day, or as told by your doctor. You can do this in the bathroom while a hot shower is running.  Try not to spend time in cool or dry air. Rest  Rest as much as you can.  Sleep with your head raised (elevated).  Make sure you get enough sleep each night. General instructions   Put a warm, moist washcloth on your face 3-4 times a day, or as often as told by your doctor. This will help with discomfort.  Wash your hands often with soap and water. If there is no soap and water, use hand sanitizer.  Do not smoke. Avoid being around people who are smoking (secondhand smoke).  Keep all follow-up visits as told by your doctor. This is important. Contact a doctor if:  You have a fever.  Your symptoms get worse.  Your symptoms do not get better within 10 days. Get help right away if:  You have a very bad headache.  You cannot stop throwing up (vomiting).  You have very bad pain or swelling around your face or eyes.  You have trouble seeing.  You feel confused.  Your neck is stiff.  You have trouble breathing. Summary  Sinusitis is swelling of your sinuses. Sinuses are hollow spaces in the bones around your face.  This condition is caused by tissues in your nose that become inflamed or swollen. This traps germs. These can lead to infection.  If you were prescribed an antibiotic medicine, take it as told by your doctor. Do not stop taking it even if you start to feel better.  Keep all follow-up visits as told by your doctor. This is important. This information is not intended to replace advice given to you by your health care provider. Make sure you discuss  any questions you have with your health care provider. Document Released: 06/24/2007 Document Revised: 06/07/2017 Document Reviewed: 06/07/2017 Elsevier Patient Education  2020 [employer].  

## 2018-09-09 ENCOUNTER — Other Ambulatory Visit: Payer: Self-pay | Admitting: Family Medicine

## 2018-09-09 DIAGNOSIS — I10 Essential (primary) hypertension: Secondary | ICD-10-CM

## 2018-09-13 ENCOUNTER — Telehealth: Payer: Self-pay | Admitting: Family Medicine

## 2018-09-13 DIAGNOSIS — M170 Bilateral primary osteoarthritis of knee: Secondary | ICD-10-CM

## 2018-09-13 NOTE — Telephone Encounter (Signed)
Pt needs a refill of pain medication? Do she need an OV? Please advise

## 2018-09-13 NOTE — Telephone Encounter (Signed)
8/20 pt was in the office and was under the understanding that the following would be filledHYDROcodone-acetaminophen (NORCO/VICODIN) 5-325 MG tablet  [facility] [phone] - [address], [address] [phone] (Phone) [phone] (Fax)

## 2018-09-15 MED ORDER — HYDROCODONE-ACETAMINOPHEN 5-325 MG PO TABS: 1.0000 | ORAL_TABLET | Freq: Four times a day (QID) | ORAL | 0 refills | Status: DC | PRN

## 2018-09-15 NOTE — Telephone Encounter (Signed)
pmp reviewed Last rx June 2020, takes vicodin prn for knee OA Med refilled

## 2018-09-16 ENCOUNTER — Other Ambulatory Visit: Payer: Self-pay | Admitting: Family Medicine

## 2018-09-16 DIAGNOSIS — F411 Generalized anxiety disorder: Secondary | ICD-10-CM

## 2018-09-16 NOTE — Telephone Encounter (Signed)
Please advise on Refill.  Casey [name]- CONTROLLED    Last Seen: 09/08/18- GAD Last Filled: 08/09/18 #45 w/ 0 Refills   Next APPT: 10/20/2018 @10am 

## 2018-09-16 NOTE — Telephone Encounter (Signed)
pmp reviewed Med refilled Decreased qty as intention is to wean as discussed in OV Currently on lexapro

## 2018-09-16 NOTE — Telephone Encounter (Signed)
Requested medication (s) are due for refill today: yes  Requested medication (s) are on the active medication list: yes  Last refill:  08/09/2018  Future visit scheduled: yes  Notes to clinic:  This refill cannot be delegated  Requested Prescriptions  Pending Prescriptions Disp Refills   ALPRAZolam (XANAX) 0.25 MG tablet [Pharmacy Med Name: ALPRAZOLAM 0.25MG  TABLETS] 45 tablet     Sig: TAKE 1 TO 2 TABLETS(0.25 TO 0.5 MG) BY MOUTH AT BEDTIME AS NEEDED FOR ANXIETY     Not Delegated - Psychiatry:  Anxiolytics/Hypnotics Failed - 09/16/2018 10:42 AM      Failed - This refill cannot be delegated      Failed - Urine Drug Screen completed in last 360 days.      Passed - Valid encounter within last 6 months    Recent Outpatient Visits          1 week ago GAD (generalized anxiety disorder)   [facility], Lilia Argue, MD   1 month ago GAD (generalized anxiety disorder)   [facility], Lilia Argue, MD   2 months ago HYPERTENSION, BENIGN ESSENTIAL   [facility], Lilia Argue, MD   5 months ago Pre-operative clearance   [facility], Lilia Argue, MD   8 months ago Type 2 diabetes mellitus with microalbuminuria, without long-term current use of insulin [facility])   [facility], Laurey Arrow, MD      Future Appointments            In 1 month Rutherford Guys, MD Primary Care at [place]

## 2018-10-12 ENCOUNTER — Ambulatory Visit (INDEPENDENT_AMBULATORY_CARE_PROVIDER_SITE_OTHER): Payer: Medicare Other | Admitting: Family Medicine

## 2018-10-12 VITALS — BP 130/62 | Ht 62.0 in | Wt 205.0 lb

## 2018-10-12 DIAGNOSIS — Z0000 Encounter for general adult medical examination without abnormal findings: Secondary | ICD-10-CM | POA: Diagnosis not present

## 2018-10-12 NOTE — Progress Notes (Signed)
Presents today for [employer] Annual [facility]ness Visit   Date of last exam: 09/08/2018  Interpreter used for this visit? No  I connected with  Casey [name] by a  telephonen and verified that I am speaking with the correct person using two identifiers.   I discussed the limitations of evaluation and management by telemedicine. The patient expressed understanding and agreed to proceed.   Patient Care Team: Casey [name], Casey M, MD as PCP - General (Family Medicine) Casey [name], Casey W, MD as PCP - Cardiology (Cardiology) Casey [name], Mark, MD as Consulting Physician (Ophthalmology)   Other items to address today:   Discussed Immunizations Discussed Eye/Dental  Follow up Scheduled 10-20-2018 Dr. Leretha [name]   Other Screening: Last screening for diabetes: 04/04/2018 Last lipid screening: 04/04/2018  ADVANCE DIRECTIVES: Discussed: yes On File: no Materials Provided: yes (mailed)  Immunization status:  Immunization History  Administered Date(s) Administered  . Influenza Split 10/20/2013  . Influenza Whole 01/10/2008, 12/28/2008, 12/02/2009  . Influenza, Seasonal, Injecte, Preservative Fre 11/23/2012  . Influenza,inj,Quad PF,6+ Mos 12/24/2015, 09/16/2016  . Influenza-Unspecified 10/09/2011, 10/19/2014, 09/23/2017  . Pneumococcal Conjugate-13 10/20/2013  . Pneumococcal Polysaccharide-23 03/24/2007  . Td 09/28/2007  . Zoster Recombinat (Shingrix) 01/21/2018     Health Maintenance Due  Topic Date Due  . OPHTHALMOLOGY EXAM  01/29/2018  . INFLUENZA VACCINE  08/20/2018  . HEMOGLOBIN A1C  10/05/2018     Functional Status Survey: Is the patient deaf or have difficulty hearing?: No Does the patient have difficulty seeing, even when wearing glasses/contacts?: No Does the patient have difficulty concentrating, remembering, or making decisions?: No Does the patient have difficulty walking or climbing stairs?: Yes(knee pain) Does the patient have difficulty dressing or  bathing?: No Does the patient have difficulty doing errands alone such as visiting a doctor's office or shopping?: No   6CIT Screen 10/12/2018 12/25/2016  What Year? 0 points 0 points  What month? 0 points 0 points  What time? 0 points 0 points  Count back from 20 0 points 0 points  Months in reverse 0 points 4 points  Repeat phrase 8 points 10 points  Total Score 8 14        Clinical Support from 10/12/2018 in [name]  AUDIT-C Score  0       Home Environment:    Lives in one story home Alone  Has trouble climbing stairs Bad knees No scattered rugs Adequate lighting/no clutter No grab bars    Patient Active Problem List   Diagnosis Date Noted  . Preoperative cardiovascular examination 05/13/2018  . Vitamin B12 deficiency 07/14/2017  . DJD (degenerative joint disease) of knee 07/07/2013  . Type 2 diabetes mellitus with microalbuminuria, without long-term current use of insulin (HCC) 05/26/2012  . Anemia 10/23/2011  . Encounter for medication monitoring 10/23/2011  . Hyponatremia 12/01/2010    Class: Acute  . Chest pain with moderate risk for cardiac etiology 11/26/2010    Class: Acute  . CYSTOCELE WITH INCOMPLETE UTERINE PROLAPSE 03/10/2010  . HYPERGLYCEMIA 02/06/2010  . ESOPHAGEAL STRICTURE 12/11/2009  . Obesity 12/09/2009  . PROBLEMS WITH SWALLOWING AND MASTICATION 12/09/2009  . MICROSCOPIC HEMATURIA 07/29/2009  . ARM PAIN, RIGHT 03/28/2009  . Fatigue 03/28/2009  . CONSTIPATION 09/25/2008  . Sinusitis, chronic 02/23/2008  . Osteoarthrosis involving lower leg 09/28/2007  . LEG EDEMA, BILATERAL 08/31/2007  . FLANK PAIN, LEFT 06/08/2007  . SPRAIN&STRAIN OTH SPEC SITES SHOULDER&UPPER ARM 06/08/2007  . BENIGN POSITIONAL VERTIGO 04/25/2007  . Headache  03/24/2007  . HYPERCHOLESTEROLEMIA 03/03/2007  . Anxiety state 03/03/2007  . HYPERTENSION, BENIGN ESSENTIAL 03/03/2007  . Allergic rhinitis 03/03/2007  . HIP PAIN, LEFT 03/03/2007  . Osteoporosis  03/03/2007     Past Medical History:  Diagnosis Date  . ALLERGIC RHINITIS   . Allergy   . ANXIETY   . ARM PAIN, RIGHT   . BENIGN POSITIONAL VERTIGO   . Carcinoma in situ of colon   . CONSTIPATION   . CYSTOCELE WITH INCOMPLETE UTERINE PROLAPSE   . ESOPHAGEAL STRICTURE   . FATIGUE   . FLANK PAIN, LEFT   . Headache(784.0)   . HIP PAIN, LEFT   . HYPERCHOLESTEROLEMIA   . HYPERGLYCEMIA   . HYPERTENSION, BENIGN ESSENTIAL 03/03/2007  . LEG EDEMA, BILATERAL   . Microscopic hematuria   . OBESITY   . OSTEOARTHRITIS, KNEE   . OSTEOPOROSIS   . Peripheral neuropathy, idiopathic   . Problems with swallowing and mastication   . SINUSITIS, RECURRENT   . SPRAIN&STRAIN OTH SPEC SITES SHOULDER&UPPER ARM      Past Surgical History:  Procedure Laterality Date  . LEFT HEART CATHETERIZATION WITH CORONARY ANGIOGRAM N/A 12/02/2010   Procedure: LEFT HEART CATHETERIZATION WITH CORONARY ANGIOGRAM;  Surgeon: Casey [name] [place], MD;  Location: [facility] CATH LAB;  Service: Cardiovascular;:: NORMAL CORONARY ARTERIES - NORMAL LV FUNCTION & EDP.    Marland Kitchen TRANSTHORACIC ECHOCARDIOGRAM  11/2010   Normal EF 60-65%. No RWMA.  Mild LAD dilation c/[name] Gr 1 DD.  -- PRETTY NORMAL ECHO     Family History  Problem Relation Age of Onset  . Coronary artery disease Mother 56  . Cancer Father 2  . Heart disease Father   . Colon cancer Other   . Diabetes Other   . Coronary artery disease Sister   . Diabetes type II Sister   . Cancer Sister   . Coronary artery disease Brother   . Kidney disease Brother   . Coronary artery disease Brother   . Breast cancer Neg Hx      Social History   Socioeconomic History  . Marital status: Single    Spouse name: Not on file  . Number of children: 1  . Years of education: 29  . Highest education level: High school graduate  Occupational History  . Not on file  Social Needs  . Financial resource strain: Not very hard  . Food insecurity    Worry: Never true    Inability: Never  true  . Transportation needs    Medical: No    Non-medical: No  Tobacco Use  . Smoking status: Never Smoker  . Smokeless tobacco: Never Used  Substance and Sexual Activity  . Alcohol use: No  . Drug use: No  . Sexual activity: Not Currently  Lifestyle  . Physical activity    Days per week: 0 days    Minutes per session: 0 min  . Stress: Only a little  Relationships  . Social Herbalist on phone: Once a week    Gets together: Once a week    Attends religious service: More than 4 times per year    Active member of club or organization: No    Attends meetings of clubs or organizations: Never    Relationship status: Never married  . Intimate partner violence    Fear of current or ex partner: No    Emotionally abused: No    Physically abused: No    Forced sexual activity: No  Other Topics Concern  . Not on file  Social History Narrative   She tries to stay active, but over the last year (20 19-20 20), she has been very limited by her right knee pain.   Is able to go grocery shopping.     No Known Allergies   Prior to Admission medications   Medication Sig Start Date End Date Taking? Authorizing Provider  ALPRAZolam (XANAX) 0.25 MG tablet Take 1 tablet (0.25 mg total) by mouth at bedtime as needed for anxiety. 09/16/18  Yes Casey Lipps, MD  amLODipine (NORVASC) 5 MG tablet TAKE 1 TABLET(5 MG) BY MOUTH DAILY 07/14/18  Yes Casey Lipps, MD  aspirin EC 81 MG tablet Take 1 tablet (81 mg total) by mouth daily. 08/21/15  Yes Sherren Mocha, MD  azelastine (ASTELIN) 0.1 % nasal spray USE 1 SPRAY IN EACH NOSTRIL TWICE DAILY AS DIRECTED 07/14/18  Yes Casey Lipps, MD  azelastine (OPTIVAR) 0.05 % ophthalmic solution INSTILL 1 DROP IN BOTH EYES TWICE DAILY 08/11/18  Yes Casey Lipps, MD  escitalopram (LEXAPRO) 20 MG tablet Take 1 tablet (20 mg total) by mouth daily. 09/08/18  Yes Casey Lipps, MD  fluticasone (FLONASE) 50 MCG/ACT nasal spray Place 1 spray into both  nostrils 2 (two) times daily. 09/08/18  Yes Casey Lipps, MD  gabapentin (NEURONTIN) 600 MG tablet TAKE 1 TABLET(600 MG) BY MOUTH THREE TIMES DAILY 07/12/18  Yes Casey Lipps, MD  glucose blood test strip 1 each by Other route daily. Use as instructed 09/08/16  Yes Sherren Mocha, MD  HYDROcodone-acetaminophen (NORCO/VICODIN) 5-325 MG tablet Take 1 tablet by mouth every 6 (six) hours as needed for moderate pain. For chronic pain 09/15/18  Yes Casey Lipps, MD  lisinopril (ZESTRIL) 40 MG tablet TAKE 1 TABLET(40 MG) BY MOUTH DAILY 09/09/18  Yes Casey Lipps, MD  meclizine (ANTIVERT) 25 MG tablet Take 1 tablet (25 mg total) by mouth 3 (three) times daily as needed for dizziness. 08/09/18  Yes Casey Lipps, MD  metFORMIN (GLUCOPHAGE) 500 MG tablet TAKE 1 TABLET(500 MG) BY MOUTH TWICE DAILY WITH A MEAL 07/12/18  Yes Casey Lipps, MD  omeprazole (PRILOSEC) 40 MG capsule TAKE 1 CAPSULE BY MOUTH ONCE DAILY 03/07/18  Yes Sherren Mocha, MD  amoxicillin-clavulanate (AUGMENTIN) 875-125 MG tablet Take 1 tablet by mouth 2 (two) times daily. 09/08/18   Casey Lipps, MD  FLUAD 0.5 ML SUSY ADM 0.5ML IM UTD 09/23/17   [provider]     Depression screen [facility] 2/9 10/12/2018 09/08/2018 08/09/2018 07/14/2018 07/14/2018  Decreased Interest 0 0 1 1 0  Down, Depressed, Hopeless 0 0 1 1 0  PHQ - 2 Score 0 0 2 2 0  Altered sleeping - - 1 2 -  Tired, decreased energy - - 1 3 -  Change in appetite - - 2 0 -  Feeling bad or failure about yourself  - - 0 0 -  Trouble concentrating - - 2 1 -  Moving slowly or fidgety/restless - - 0 0 -  Suicidal thoughts - - 0 0 -  PHQ-9 Score - - 8 8 -  Difficult doing work/chores - - - Somewhat difficult -  Some recent data might be hidden     Fall Risk  10/12/2018 09/08/2018 08/09/2018 07/14/2018 04/04/2018  Falls in the past year? 0 0 0 0 0  Number falls in past yr: 0 0 0 0 0  Injury  with Fall? 0 0 0 0 0  Follow up Falls evaluation completed;Education  provided;Falls prevention discussed - - - Falls evaluation completed      PHYSICAL EXAM: BP 130/62 Comment: taken from previous visit  Ht 5\' 2"  (1.575 [name])   Wt 205 lb (93 kg)   BMI 37.49 kg/[name]    Wt Readings from Last 3 Encounters:  [name] 205 lb (93 kg)  09/08/18 205 lb (93 kg)  08/09/18 203 lb (92.1 kg)     Medicare annual wellness visit, subsequent     Physical Exam   Education/Counseling provided regarding diet and exercise, prevention of chronic diseases, smoking/tobacco cessation, if applicable, and reviewed "Covered Medicare Preventive Services."

## 2018-10-12 NOTE — Patient Instructions (Signed)
Thank you for taking time to come for your Medicare Wellness Visit. I appreciate your ongoing commitment to your health goals. Please review the following plan we discussed and let me know if I can assist you in the future.    LPN   Preventive Care 81 Years and Older, Female Preventive care refers to lifestyle choices and visits with your health care provider that can promote health and wellness. This includes:  A yearly physical exam. This is also called an annual well check.  Regular dental and eye exams.  Immunizations.  Screening for certain conditions.  Healthy lifestyle choices, such as diet and exercise. What can I expect for my preventive care visit? Physical exam Your health care provider will check:  Height and weight. These may be used to calculate body mass index (BMI), which is a measurement that tells if you are at a healthy weight.  Heart rate and blood pressure.  Your skin for abnormal spots. Counseling Your health care provider may ask you questions about:  Alcohol, tobacco, and drug use.  Emotional well-being.  Home and relationship well-being.  Sexual activity.  Eating habits.  History of falls.  Memory and ability to understand (cognition).  Work and work environment.  Pregnancy and menstrual history. What immunizations do I need?  Influenza (flu) vaccine  This is recommended every year. Tetanus, diphtheria, and pertussis (Tdap) vaccine  You may need a Td booster every 10 years. Varicella (chickenpox) vaccine  You may need this vaccine if you have not already been vaccinated. Zoster (shingles) vaccine  You may need this after age 60. Pneumococcal conjugate (PCV13) vaccine  One dose is recommended after age 81. Pneumococcal polysaccharide (PPSV23) vaccine  One dose is recommended after age 81. Measles, mumps, and rubella (MMR) vaccine  You may need at least one dose of MMR if you were born in 1957 or later. You may also  need a second dose. Meningococcal conjugate (MenACWY) vaccine  You may need this if you have certain conditions. Hepatitis A vaccine  You may need this if you have certain conditions or if you travel or work in places where you may be exposed to hepatitis A. Hepatitis B vaccine  You may need this if you have certain conditions or if you travel or work in places where you may be exposed to hepatitis B. Haemophilus influenzae type b (Hib) vaccine  You may need this if you have certain conditions. You may receive vaccines as individual doses or as more than one vaccine together in one shot (combination vaccines). Talk with your health care provider about the risks and benefits of combination vaccines. What tests do I need? Blood tests  Lipid and cholesterol levels. These may be checked every 5 years, or more frequently depending on your overall health.  Hepatitis C test.  Hepatitis B test. Screening  Lung cancer screening. You may have this screening every year starting at age 55 if you have a 30-pack-year history of smoking and currently smoke or have quit within the past 15 years.  Colorectal cancer screening. All adults should have this screening starting at age 50 and continuing until age 75. Your health care provider may recommend screening at age 45 if you are at increased risk. You will have tests every 1-10 years, depending on your results and the type of screening test.  Diabetes screening. This is done by checking your blood sugar (glucose) after you have not eaten for a while (fasting). You may have this done every   1-3 years.  Mammogram. This may be done every 1-2 years. Talk with your health care provider about how often you should have regular mammograms.  BRCA-related cancer screening. This may be done if you have a family history of breast, ovarian, tubal, or peritoneal cancers. Other tests  Sexually transmitted disease (STD) testing.  Bone density scan. This is done  to screen for osteoporosis. You may have this done starting at age 81. Follow these instructions at home: Eating and drinking  Eat a diet that includes fresh fruits and vegetables, whole grains, lean protein, and low-fat dairy products. Limit your intake of foods with high amounts of sugar, saturated fats, and salt.  Take vitamin and mineral supplements as recommended by your health care provider.  Do not drink alcohol if your health care provider tells you not to drink.  If you drink alcohol: ? Limit how much you have to 0-1 drink a day. ? Be aware of how much alcohol is in your drink. In the [place] one drink equals one 12 oz bottle of beer (355 mL), one 5 oz glass of wine (148 mL), or one 1 oz glass of hard liquor (44 mL). Lifestyle  Take daily care of your teeth and gums.  Stay active. Exercise for at least 30 minutes on 5 or more days each week.  Do not use any products that contain nicotine or tobacco, such as cigarettes, e-cigarettes, and chewing tobacco. If you need help quitting, ask your health care provider.  If you are sexually active, practice safe sex. Use a condom or other form of protection in order to prevent STIs (sexually transmitted infections).  Talk with your health care provider about taking a low-dose aspirin or statin. What's next?  Go to your health care provider once a year for a well check visit.  Ask your health care provider how often you should have your eyes and teeth checked.  Stay up to date on all vaccines. This information is not intended to replace advice given to you by your health care provider. Make sure you discuss any questions you have with your health care provider. Document Released: 02/01/2015 Document Revised: 12/30/2017 Document Reviewed: 12/30/2017 Elsevier Patient Education  2020 [employer] Inc[employer]  

## 2018-10-20 ENCOUNTER — Ambulatory Visit (INDEPENDENT_AMBULATORY_CARE_PROVIDER_SITE_OTHER): Payer: Medicare Other | Admitting: Family Medicine

## 2018-10-20 ENCOUNTER — Encounter: Payer: Self-pay | Admitting: Family Medicine

## 2018-10-20 ENCOUNTER — Other Ambulatory Visit: Payer: Self-pay

## 2018-10-20 VITALS — BP 137/84 | HR 77 | Temp 98.4°F | Ht 62.0 in | Wt 202.4 lb

## 2018-10-20 DIAGNOSIS — E538 Deficiency of other specified B group vitamins: Secondary | ICD-10-CM | POA: Diagnosis not present

## 2018-10-20 DIAGNOSIS — I10 Essential (primary) hypertension: Secondary | ICD-10-CM | POA: Diagnosis not present

## 2018-10-20 DIAGNOSIS — R809 Proteinuria, unspecified: Secondary | ICD-10-CM

## 2018-10-20 DIAGNOSIS — E1129 Type 2 diabetes mellitus with other diabetic kidney complication: Secondary | ICD-10-CM | POA: Diagnosis not present

## 2018-10-20 DIAGNOSIS — E7800 Pure hypercholesterolemia, unspecified: Secondary | ICD-10-CM | POA: Diagnosis not present

## 2018-10-20 DIAGNOSIS — H8110 Benign paroxysmal vertigo, unspecified ear: Secondary | ICD-10-CM

## 2018-10-20 DIAGNOSIS — D649 Anemia, unspecified: Secondary | ICD-10-CM

## 2018-10-20 DIAGNOSIS — F411 Generalized anxiety disorder: Secondary | ICD-10-CM

## 2018-10-20 DIAGNOSIS — K219 Gastro-esophageal reflux disease without esophagitis: Secondary | ICD-10-CM

## 2018-10-20 MED ORDER — BUSPIRONE HCL 5 MG PO TABS: 5.0000 mg | ORAL_TABLET | Freq: Two times a day (BID) | ORAL | 3 refills | Status: DC

## 2018-10-20 MED ORDER — MECLIZINE HCL 25 MG PO TABS: 25.0000 mg | ORAL_TABLET | Freq: Three times a day (TID) | ORAL | 1 refills | Status: DC | PRN

## 2018-10-20 MED ORDER — OMEPRAZOLE 40 MG PO CPDR: 40.0000 mg | DELAYED_RELEASE_CAPSULE | Freq: Every day | ORAL | 5 refills | Status: DC

## 2018-10-20 MED ORDER — ALPRAZOLAM 0.25 MG PO TABS: 0.2500 mg | ORAL_TABLET | Freq: Every evening | ORAL | 1 refills | Status: DC | PRN

## 2018-10-20 NOTE — Patient Instructions (Addendum)
1.Lets try buspirone (buspar) for anxiety, please take 5mg  twice a day. We can increase dose if needed after 4 weeks of being on medication. Please keep me posted. 2. I have also made a referral to psychiatry to help [name] better treat for anxiety.     If you have lab work done today you will be contacted with your lab results within the next 2 weeks.  If you have not heard from [name] then please contact us. The fastest way to get your results is to register for My Chart.   IF you received an x-ray today, you will receive an invoice from [facility]. Please contact [facility] at [phone] with questions or concerns regarding your invoice.   IF you received labwork today, you will receive an invoice from [place]. Please contact [facility] at [phone] with questions or concerns regarding your invoice.   Our billing staff will not be able to assist you with questions regarding bills from these companies.  You will be contacted with the lab results as soon as they are available. The fastest way to get your results is to activate your My Chart account. Instructions are located on the last page of this paperwork. If you have not heard from [name] regarding the results in 2 weeks, please contact this office.

## 2018-10-20 NOTE — Progress Notes (Signed)
10/1/20209:46 AM  Casey [name], Casey [name], 83 y.o., female [id number]  Chief Complaint  Patient presents with  . Follow-up    dm, htn, has eye appt this month for eye pain    HPI:   Patient is a 83 y.o. female with past medical history significant for DM2 with peripheral neuropathy and microabuminuria, HTN, HLP, OA of knee, vitamin b12 deficiency, osteoporosis, seasonal allergies,who presents today for followup   Last OV aug She is overall doing well except for her anxiety Increased lexapro. Stopped taking lexapro after about 2 weeks. Has anxiety around driving and crowds Want alprazolam increased Does not check cbgs at home Has upcoming appt with eye doctor Taking all other other meds as prescribed, denies any side effects pmp reviewed  Meds tried: Sertraline lexapro   Lab Results  Component Value Date   HGBA1C 6.6 (H) 04/04/2018   HGBA1C 6.6 (A) 01/03/2018   HGBA1C 6.5 (A) 10/12/2017   Lab Results  Component Value Date   MICROALBUR 0.7 10/11/2014   LDLCALC 146 (H) 04/04/2018   CREATININE 0.82 04/04/2018    Depression screen PHQ 2/9 10/20/2018 10/12/2018 09/08/2018  Decreased Interest 0 0 0  Down, Depressed, Hopeless 0 0 0  PHQ - 2 Score 0 0 0  Altered sleeping - - -  Tired, decreased energy - - -  Change in appetite - - -  Feeling bad or failure about yourself  - - -  Trouble concentrating - - -  Moving slowly or fidgety/restless - - -  Suicidal thoughts - - -  PHQ-9 Score - - -  Difficult doing work/chores - - -  Some recent data might be hidden    Fall Risk  10/20/2018 10/12/2018 09/08/2018 08/09/2018 07/14/2018  Falls in the past year? 0 0 0 0 0  Number falls in past yr: 0 0 0 0 0  Injury with Fall? 0 0 0 0 0  Follow up - Falls evaluation completed;Education provided;Falls prevention discussed - - -     No Known Allergies  Prior to Admission medications   Medication Sig Start Date End Date Taking? Authorizing Provider  ALPRAZolam (XANAX) 0.25  MG tablet Take 1 tablet (0.25 mg total) by mouth at bedtime as needed for anxiety. 09/16/18  Yes Rutherford Guys, MD  amLODipine (NORVASC) 5 MG tablet TAKE 1 TABLET(5 MG) BY MOUTH DAILY 07/14/18  Yes Rutherford Guys, MD  amoxicillin-clavulanate (AUGMENTIN) 875-125 MG tablet Take 1 tablet by mouth 2 (two) times daily. 09/08/18  Yes Rutherford Guys, MD  aspirin EC 81 MG tablet Take 1 tablet (81 mg total) by mouth daily. 08/21/15  Yes [name], MD  azelastine (ASTELIN) 0.1 % nasal spray USE 1 SPRAY IN EACH NOSTRIL TWICE DAILY AS DIRECTED 07/14/18  Yes Rutherford Guys, MD  azelastine (OPTIVAR) 0.05 % ophthalmic solution INSTILL 1 DROP IN BOTH EYES TWICE DAILY 08/11/18  Yes Rutherford Guys, MD  escitalopram (LEXAPRO) 20 MG tablet Take 1 tablet (20 mg total) by mouth daily. 09/08/18  Yes Rutherford Guys, MD  fluticasone (FLONASE) 50 MCG/ACT nasal spray Place 1 spray into both nostrils 2 (two) times daily. 09/08/18  Yes Rutherford Guys, MD  gabapentin (NEURONTIN) 600 MG tablet TAKE 1 TABLET(600 MG) BY MOUTH THREE TIMES DAILY 07/12/18  Yes Rutherford Guys, MD  glucose blood test strip 1 each by Other route daily. Use as instructed 09/08/16  Yes [name], MD  HYDROcodone-acetaminophen (NORCO/VICODIN) 5-325 MG tablet Take  1 tablet by mouth every 6 (six) hours as needed for moderate pain. For chronic pain 09/15/18  Yes Rutherford Guys, MD  lisinopril (ZESTRIL) 40 MG tablet TAKE 1 TABLET(40 MG) BY MOUTH DAILY 09/09/18  Yes Rutherford Guys, MD  meclizine (ANTIVERT) 25 MG tablet Take 1 tablet (25 mg total) by mouth 3 (three) times daily as needed for dizziness. 08/09/18  Yes Rutherford Guys, MD  metFORMIN (GLUCOPHAGE) 500 MG tablet TAKE 1 TABLET(500 MG) BY MOUTH TWICE DAILY WITH A MEAL 07/12/18  Yes Rutherford Guys, MD  omeprazole (PRILOSEC) 40 MG capsule TAKE 1 CAPSULE BY MOUTH ONCE DAILY 03/07/18  Yes [name], MD    Past Medical History:  Diagnosis Date  . ALLERGIC RHINITIS   . Allergy   . ANXIETY    . ARM PAIN, RIGHT   . BENIGN POSITIONAL VERTIGO   . Carcinoma in situ of colon   . CONSTIPATION   . CYSTOCELE WITH INCOMPLETE UTERINE PROLAPSE   . ESOPHAGEAL STRICTURE   . FATIGUE   . FLANK PAIN, LEFT   . Headache(784.0)   . HIP PAIN, LEFT   . HYPERCHOLESTEROLEMIA   . HYPERGLYCEMIA   . HYPERTENSION, BENIGN ESSENTIAL 03/03/2007  . LEG EDEMA, BILATERAL   . Microscopic hematuria   . OBESITY   . OSTEOARTHRITIS, KNEE   . OSTEOPOROSIS   . Peripheral neuropathy, idiopathic   . Problems with swallowing and mastication   . SINUSITIS, RECURRENT   . SPRAIN&STRAIN OTH SPEC SITES SHOULDER&UPPER ARM     Past Surgical History:  Procedure Laterality Date  . LEFT HEART CATHETERIZATION WITH CORONARY ANGIOGRAM N/A 12/02/2010   Procedure: LEFT HEART CATHETERIZATION WITH CORONARY ANGIOGRAM;  Surgeon: Peter M Martinique, MD;  Location: [facility] CATH LAB;  Service: Cardiovascular;:: NORMAL CORONARY ARTERIES - NORMAL LV FUNCTION & EDP.    Marland Kitchen TRANSTHORACIC ECHOCARDIOGRAM  11/2010   Normal EF 60-65%. No RWMA.  Mild LAD dilation c/w Gr 1 DD.  -- PRETTY NORMAL ECHO    Social History   Tobacco Use  . Smoking status: Never Smoker  . Smokeless tobacco: Never Used  Substance Use Topics  . Alcohol use: No    Family History  Problem Relation Age of Onset  . Coronary artery disease Mother 68  . Cancer Father 42  . Heart disease Father   . Colon cancer Other   . Diabetes Other   . Coronary artery disease Sister   . Diabetes type II Sister   . Cancer Sister   . Coronary artery disease Brother   . Kidney disease Brother   . Coronary artery disease Brother   . Breast cancer Neg Hx     Review of Systems  Constitutional: Negative for chills and fever.  Respiratory: Negative for cough and shortness of breath.   Cardiovascular: Negative for chest pain, palpitations and leg swelling.  Gastrointestinal: Negative for abdominal pain, nausea and vomiting.   Per hpi  OBJECTIVE:  Today's Vitals   10/20/18  0941  BP: 137/84  Pulse: 77  Temp: 98.4 F (36.9 C)  SpO2: 98%  Weight: 202 lb 6.4 oz (91.8 kg)  Height: _0  (1.575 m)   Body mass index is 37.02 kg/m.   Wt Readings from Last 3 Encounters:  10/20/18 202 lb 6.4 oz (91.8 kg)  10/12/18 205 lb (93 kg)  09/08/18 205 lb (93 kg)     Physical Exam Vitals signs and nursing note reviewed.  Constitutional:      Appearance: She  is well-developed.  HENT:     Head: Normocephalic and atraumatic.     Mouth/Throat:     Pharynx: No oropharyngeal exudate.  Eyes:     General: No scleral icterus.    Conjunctiva/sclera: Conjunctivae normal.     Pupils: Pupils are equal, round, and reactive to light.  Neck:     Musculoskeletal: Neck supple.  Cardiovascular:     Rate and Rhythm: Normal rate and regular rhythm.     Heart sounds: Normal heart sounds. No murmur. No friction rub. No gallop.   Pulmonary:     Effort: Pulmonary effort is normal.     Breath sounds: Normal breath sounds. No wheezing or rales.  Skin:    General: Skin is warm and dry.  Neurological:     Mental Status: She is alert and oriented to person, place, and time.      Diabetic Foot Form - Detailed   Diabetic Foot Exam - detailed Can the patient see the bottom of their feet?: Yes Are the shoes appropriate in style and fit?: No Is there swelling or and abnormal foot shape?: No Is there a claw toe deformity?: No Is there elevated skin temparature?: No Is there foot or ankle muscle weakness?: No Normal Range of Motion: No Semmes-Weinstein Monofilament Test R Site 1-Great Toe: Neg L Site 1-Great Toe: Neg        No results found for this or any previous visit (from the past 24 hour(s)).  No results found.   ASSESSMENT and PLAN  1. HYPERTENSION, BENIGN ESSENTIAL Controlled. Continue current regime.  - Lipid panel - TSH - CMP14+EGFR  2. Type 2 diabetes mellitus with microalbuminuria, without long-term current use of insulin ([place]) Checking labs today,  medications will be adjusted as needed.  - Lipid panel - TSH - CMP14+EGFR - Hemoglobin A1c  3. HYPERCHOLESTEROLEMIA Checking labs today, medications will be adjusted as needed.  - Lipid panel - TSH - CMP14+EGFR  4. Vitamin B12 deficiency - Vitamin B12  5. Anemia, unspecified type - CBC  6. Benign paroxysmal positional vertigo, unspecified laterality - meclizine (ANTIVERT) 25 MG tablet; Take 1 tablet (25 mg total) by mouth 3 (three) times daily as needed for dizziness.  7. Gastroesophageal reflux disease Controlled. Continue current regime.  - omeprazole (PRILOSEC) 40 MG capsule; Take 1 capsule (40 mg total) by mouth daily.  8. GAD (generalized anxiety disorder) Not controlled. Discussed again with patient, concerns of bzd and opiates being coprescribed. Discussed I would not increase alprazolam prescription to previous doses. Trial of buspar. Referring to psych.  - ALPRAZolam (XANAX) 0.25 MG tablet; Take 1 tablet (0.25 mg total) by mouth at bedtime as needed for anxiety. - Ambulatory referral to Psychiatry  Other orders - busPIRone (BUSPAR) 5 MG tablet; Take 1 tablet (5 mg total) by mouth 2 (two) times daily. For anxiety  Return in about 3 months (around 01/20/2019).    Rutherford Guys, MD Primary Care at [place], [place] [address] Ph.  [phone] Fax [phone]

## 2018-10-21 LAB — LIPID PANEL
Chol/HDL Ratio: 3 ratio (ref 0.0–4.4)
Cholesterol, Total: 220 mg/dL — ABNORMAL HIGH (ref 100–199)
HDL: 74 mg/dL (ref >39)
LDL Chol Calc (NIH): 134 mg/dL — ABNORMAL HIGH (ref 0–99)
Triglycerides: 67 mg/dL (ref 0–149)
VLDL Cholesterol Cal: 12 mg/dL (ref 5–40)

## 2018-10-21 LAB — CBC
Hematocrit: 35.1 % (ref 34.0–46.6)
Hemoglobin: 11.2 g/dL (ref 11.1–15.9)
MCH: 26.7 pg (ref 26.6–33.0)
MCHC: 31.9 g/dL (ref 31.5–35.7)
MCV: 84 fL (ref 79–97)
Platelets: 336 10*3/uL (ref 150–450)
RBC: 4.19 x10E6/uL (ref 3.77–5.28)
RDW: 14.3 % (ref 11.7–15.4)
WBC: 6.5 10*3/uL (ref 3.4–10.8)

## 2018-10-21 LAB — CMP14+EGFR
ALT: 13 IU/L (ref 0–32)
AST: 21 IU/L (ref 0–40)
Albumin/Globulin Ratio: 1.4 (ref 1.2–2.2)
Albumin: 4.2 g/dL (ref 3.6–4.6)
Alkaline Phosphatase: 106 IU/L (ref 39–117)
BUN/Creatinine Ratio: 16 (ref 12–28)
BUN: 14 mg/dL (ref 8–27)
Bilirubin Total: 0.2 mg/dL (ref 0.0–1.2)
CO2: 23 mmol/L (ref 20–29)
Calcium: 9.9 mg/dL (ref 8.7–10.3)
Chloride: 100 mmol/L (ref 96–106)
Creatinine, Ser: 0.9 mg/dL (ref 0.57–1.00)
GFR calc Af Amer: 69 mL/min/{1.73_m2} (ref >59)
GFR calc non Af Amer: 60 mL/min/{1.73_m2} (ref >59)
Globulin, Total: 3.1 g/dL (ref 1.5–4.5)
Glucose: 115 mg/dL — ABNORMAL HIGH (ref 65–99)
Potassium: 4.7 mmol/L (ref 3.5–5.2)
Sodium: 137 mmol/L (ref 134–144)
Total Protein: 7.3 g/dL (ref 6.0–8.5)

## 2018-10-21 LAB — HEMOGLOBIN A1C
Est. average glucose Bld gHb Est-mCnc: 146 mg/dL
Hgb A1c MFr Bld: 6.7 % — ABNORMAL HIGH (ref 4.8–5.6)

## 2018-10-21 LAB — TSH: TSH: 1.45 u[IU]/mL (ref 0.450–4.500)

## 2018-10-21 LAB — VITAMIN B12: Vitamin B-12: 360 pg/mL (ref 232–1245)

## 2018-11-01 ENCOUNTER — Encounter: Payer: Self-pay | Admitting: Radiology

## 2018-11-01 ENCOUNTER — Other Ambulatory Visit: Payer: Self-pay | Admitting: Family Medicine

## 2018-11-01 DIAGNOSIS — M170 Bilateral primary osteoarthritis of knee: Secondary | ICD-10-CM

## 2018-11-01 NOTE — Telephone Encounter (Signed)
Requested medication (s) are due for refill today: yes  Requested medication (s) are on the active medication list: yes  Last refill:  09/15/2018  Future visit scheduled: yes  Notes to clinic:  Refill cannot be delegated    Requested Prescriptions  Pending Prescriptions Disp Refills   HYDROcodone-acetaminophen (NORCO/VICODIN) 5-325 MG tablet 120 tablet 0    Sig: Take 1 tablet by mouth every 6 (six) hours as needed for moderate pain. For chronic pain     Not Delegated - Analgesics:  Opioid Agonist Combinations Failed - 11/01/2018 11:32 AM      Failed - This refill cannot be delegated      Failed - Urine Drug Screen completed in last 360 days.      Passed - Valid encounter within last 6 months    Recent Outpatient Visits          1 week ago HYPERTENSION, BENIGN ESSENTIAL   [facility], Lilia Argue, MD   2 weeks ago Medicare annual wellness visit, subsequent   [facility] at [facility], Arlie Solomons, MD   1 month ago GAD (generalized anxiety disorder)   [facility], Lilia Argue, MD   2 months ago GAD (generalized anxiety disorder)   [facility], Lilia Argue, MD   3 months ago HYPERTENSION, BENIGN ESSENTIAL   [facility], Lilia Argue, MD      Future Appointments            In 2 months Rutherford Guys, MD Primary [facility] at [place], [facility]

## 2018-11-01 NOTE — Telephone Encounter (Signed)
Medication Refill - Medication: HYDROcodone-acetaminophen (NORCO/VICODIN) 5-325 MG tablet    Has the patient contacted their pharmacy? No. (Agent: If no, request that the patient contact the pharmacy for the refill.) (Agent: If yes, when and what did the pharmacy advise?)  Preferred Pharmacy (with phone number or street name):  [facility] [phone] - [place], [address] AT [facility] [address] [place] [phone] (Phone) [phone] (Fax)     Agent: Please be advised that RX refills may take up to 3 business days. We ask that you follow-up with your pharmacy.

## 2018-11-02 NOTE — Telephone Encounter (Signed)
Please advise on med refill.  CONTROLLED  HYDROcodone-acetaminophen (NORCO/VICODIN) 5-325 MG tablet [[id number]]  Pharmacy: [facility] [place], [place] filled 09/15/18   #120 with 0 refills  Last SEEN: 10/20/18  Next F/U: 01/23/2019 @10 :43P

## 2018-11-03 MED ORDER — HYDROCODONE-ACETAMINOPHEN 5-325 MG PO TABS: 1.0000 | ORAL_TABLET | Freq: Four times a day (QID) | ORAL | 0 refills | Status: DC | PRN

## 2018-11-03 NOTE — Telephone Encounter (Signed)
pmp reviewd, appropriate meds refilled 

## 2018-11-14 ENCOUNTER — Other Ambulatory Visit: Payer: Self-pay | Admitting: Family Medicine

## 2018-11-14 DIAGNOSIS — F411 Generalized anxiety disorder: Secondary | ICD-10-CM

## 2018-11-14 DIAGNOSIS — G609 Hereditary and idiopathic neuropathy, unspecified: Secondary | ICD-10-CM

## 2018-11-14 DIAGNOSIS — Z1231 Encounter for screening mammogram for malignant neoplasm of breast: Secondary | ICD-10-CM

## 2018-11-17 ENCOUNTER — Other Ambulatory Visit: Payer: Self-pay | Admitting: Family Medicine

## 2018-11-17 DIAGNOSIS — E1129 Type 2 diabetes mellitus with other diabetic kidney complication: Secondary | ICD-10-CM

## 2018-11-17 DIAGNOSIS — R809 Proteinuria, unspecified: Secondary | ICD-10-CM

## 2018-11-17 MED ORDER — METFORMIN HCL 500 MG PO TABS: ORAL_TABLET | ORAL | 1 refills | Status: DC

## 2018-11-17 NOTE — Telephone Encounter (Signed)
Patient requesting metFORMIN (GLUCOPHAGE) 500 MG tablet, informed please allow 48 to 72 hour turn around time.  [facility] [phone] - [place], [address] AT [facility] [address] [facility] [address] [phone] (Phone) [phone] (Fax)

## 2018-11-28 ENCOUNTER — Telehealth: Payer: Self-pay | Admitting: Family Medicine

## 2018-11-28 NOTE — Telephone Encounter (Signed)
Copied from [place] [id number]. Topic: General - Other >> Nov 28, 2018 12:53 PM Casey [name] wrote: Reason for CRM: pt called and stated that she would like a call back from the office regarding a form that she bought to the office to be filled out. Pt states this has no been done yet. Please advise

## 2018-12-22 ENCOUNTER — Other Ambulatory Visit: Payer: Self-pay

## 2018-12-22 ENCOUNTER — Encounter: Payer: Self-pay | Admitting: Orthopaedic Surgery

## 2018-12-22 ENCOUNTER — Ambulatory Visit (INDEPENDENT_AMBULATORY_CARE_PROVIDER_SITE_OTHER): Payer: Medicare Other | Admitting: Orthopaedic Surgery

## 2018-12-22 VITALS — Ht 62.0 in | Wt 208.0 lb

## 2018-12-22 DIAGNOSIS — M170 Bilateral primary osteoarthritis of knee: Secondary | ICD-10-CM

## 2018-12-22 DIAGNOSIS — M1712 Unilateral primary osteoarthritis, left knee: Secondary | ICD-10-CM | POA: Diagnosis not present

## 2018-12-22 DIAGNOSIS — M1711 Unilateral primary osteoarthritis, right knee: Secondary | ICD-10-CM

## 2018-12-22 MED ORDER — METHYLPREDNISOLONE ACETATE 40 MG/ML IJ SUSP
80.0000 mg | INTRAMUSCULAR | Status: AC | PRN
  Administered 2018-12-22: 80 mg via INTRA_ARTICULAR

## 2018-12-22 MED ORDER — BUPIVACAINE HCL 0.5 % IJ SOLN
2.0000 mL | INTRAMUSCULAR | Status: AC | PRN
  Administered 2018-12-22: 14:00:00 2 mL via INTRA_ARTICULAR

## 2018-12-22 MED ORDER — LIDOCAINE HCL 1 % IJ SOLN
2.0000 mL | INTRAMUSCULAR | Status: AC | PRN
  Administered 2018-12-22: 14:00:00 2 mL

## 2018-12-22 NOTE — Progress Notes (Signed)
Office Visit Note   Patient: Casey [name]           Date of Birth: Aug 02, 1935           MRN: [id number] Visit Date: 12/22/2018              Requested by: Myles Lipps, MD [address] [place],  [place] [address] PCP: Myles Lipps, MD   Assessment & Plan: Visit Diagnoses:  1. Primary osteoarthritis of both knees     Plan: Advanced osteoarthritis both knees.  More symptomatic on the right.  Will inject that today.  Mrs. Dadisman is diabetic and I am concerned about giving her too much cortisone by injecting both knees.  She had talked about having knee replacement in the spring but I think that she needs to continue losing weight as her BMI is 38  Follow-Up Instructions: Return if symptoms worsen or fail to improve.   Orders:  Orders Placed This Encounter  Procedures  . Large Joint Inj: R knee   No orders of the defined types were placed in this encounter.     Procedures: Large Joint Inj: R knee on 12/22/2018 2:21 PM Indications: pain and diagnostic evaluation Details: 25 G 1.5 in needle, anteromedial approach  Arthrogram: No  Medications: 2 mL lidocaine 1 %; 2 mL bupivacaine 0.5 %; 80 mg methylPREDNISolone acetate 40 MG/ML Procedure, treatment alternatives, risks and benefits explained, specific risks discussed. Consent was given by the patient. Immediately prior to procedure a time out was called to verify the correct patient, procedure, equipment, support staff and site/side marked as required. Patient was prepped and draped in the usual sterile fashion.       Clinical Data: No additional findings.   Subjective: Chief Complaint  Patient presents with  . Right Knee - Pain  . Left Knee - Pain  Patient presents today with recurrent bilateral knee pain. She was last evaluated with Dr.Whitfield one year ago. She has had cortisone injections in the past with her PCP. She is wanting bilateral injections today. She is taking hydrocodone for pain. Her pain is all  throughout and worse with weightbearing. She is diabetic.   HPI  Review of Systems   Objective: Vital Signs: Ht 5\' 2"  (1.575 m)   Wt 208 lb (94.3 kg)   BMI 38.04 kg/m   Physical Exam Constitutional:      Appearance: She is well-developed.  Eyes:     Pupils: Pupils are equal, round, and reactive to light.  Pulmonary:     Effort: Pulmonary effort is normal.  Skin:    General: Skin is warm and dry.  Neurological:     Mental Status: She is alert and oriented to person, place, and time.  Psychiatric:        Behavior: Behavior normal.     Ortho Exam awake alert and oriented x3 comfortable sitting.  Full extension both knees.  Large knees.  Difficulty determining whether there was an effusion.  Some patellar crepitation.  Predominant medial joint pain bilaterally but more so right than the left.  Some mild ankle edema bilaterally.  Good capillary refill to toes.  Straight leg raise negative no pain range of motion of either hip Specialty Comments:  No specialty comments available.  Imaging: No results found.   PMFS History: Patient Active Problem List   Diagnosis Date Noted  . Preoperative cardiovascular examination 05/13/2018  . Vitamin B12 deficiency 07/14/2017  . DJD (degenerative joint disease) of knee 07/07/2013  .  Type 2 diabetes mellitus with microalbuminuria, without long-term current use of insulin ([place]) 05/26/2012  . Anemia 10/23/2011  . Encounter for medication monitoring 10/23/2011  . Hyponatremia 12/01/2010    Class: Acute  . Chest pain with moderate risk for cardiac etiology 11/26/2010    Class: Acute  . CYSTOCELE WITH INCOMPLETE UTERINE PROLAPSE 03/10/2010  . HYPERGLYCEMIA 02/06/2010  . ESOPHAGEAL STRICTURE 12/11/2009  . Obesity 12/09/2009  . PROBLEMS WITH SWALLOWING AND MASTICATION 12/09/2009  . MICROSCOPIC HEMATURIA 07/29/2009  . ARM PAIN, RIGHT 03/28/2009  . Fatigue 03/28/2009  . CONSTIPATION 09/25/2008  . Sinusitis, chronic 02/23/2008  .  Osteoarthrosis involving lower leg 09/28/2007  . LEG EDEMA, BILATERAL 08/31/2007  . FLANK PAIN, LEFT 06/08/2007  . SPRAIN&STRAIN OTH SPEC SITES SHOULDER&UPPER ARM 06/08/2007  . BENIGN POSITIONAL VERTIGO 04/25/2007  . Headache 03/24/2007  . HYPERCHOLESTEROLEMIA 03/03/2007  . Anxiety state 03/03/2007  . HYPERTENSION, BENIGN ESSENTIAL 03/03/2007  . Allergic rhinitis 03/03/2007  . HIP PAIN, LEFT 03/03/2007  . Osteoporosis 03/03/2007   Past Medical History:  Diagnosis Date  . ALLERGIC RHINITIS   . Allergy   . ANXIETY   . ARM PAIN, RIGHT   . BENIGN POSITIONAL VERTIGO   . Carcinoma in situ of colon   . CONSTIPATION   . CYSTOCELE WITH INCOMPLETE UTERINE PROLAPSE   . ESOPHAGEAL STRICTURE   . FATIGUE   . FLANK PAIN, LEFT   . Headache(784.0)   . HIP PAIN, LEFT   . HYPERCHOLESTEROLEMIA   . HYPERGLYCEMIA   . HYPERTENSION, BENIGN ESSENTIAL 03/03/2007  . LEG EDEMA, BILATERAL   . Microscopic hematuria   . OBESITY   . OSTEOARTHRITIS, KNEE   . OSTEOPOROSIS   . Peripheral neuropathy, idiopathic   . Problems with swallowing and mastication   . SINUSITIS, RECURRENT   . SPRAIN&STRAIN OTH SPEC SITES SHOULDER&UPPER ARM     Family History  Problem Relation Age of Onset  . Coronary artery disease Mother 54  . Cancer Father 38  . Heart disease Father   . Colon cancer Other   . Diabetes Other   . Coronary artery disease Sister   . Diabetes type II Sister   . Cancer Sister   . Coronary artery disease Brother   . Kidney disease Brother   . Coronary artery disease Brother   . Breast cancer Neg Hx     Past Surgical History:  Procedure Laterality Date  . LEFT HEART CATHETERIZATION WITH CORONARY ANGIOGRAM N/A 12/02/2010   Procedure: LEFT HEART CATHETERIZATION WITH CORONARY ANGIOGRAM;  Surgeon: Peter M Martinique, MD;  Location: [facility] CATH LAB;  Service: Cardiovascular;:: NORMAL CORONARY ARTERIES - NORMAL LV FUNCTION & EDP.    Marland Kitchen TRANSTHORACIC ECHOCARDIOGRAM  11/2010   Normal EF 60-65%. No RWMA.   Mild LAD dilation c/w Gr 1 DD.  -- PRETTY NORMAL ECHO   Social History   Occupational History  . Not on file  Tobacco Use  . Smoking status: Never Smoker  . Smokeless tobacco: Never Used  Substance and Sexual Activity  . Alcohol use: No  . Drug use: No  . Sexual activity: Not Currently

## 2018-12-26 ENCOUNTER — Other Ambulatory Visit: Payer: Self-pay | Admitting: Family Medicine

## 2018-12-26 DIAGNOSIS — M170 Bilateral primary osteoarthritis of knee: Secondary | ICD-10-CM

## 2018-12-26 NOTE — Telephone Encounter (Signed)
HYDROcodone-acetaminophen (NORCO/VICODIN) 5-325 MG tablet     Patient requesting refill. Patient states she out of medication.

## 2018-12-28 ENCOUNTER — Telehealth: Payer: Self-pay | Admitting: Family Medicine

## 2018-12-28 DIAGNOSIS — M170 Bilateral primary osteoarthritis of knee: Secondary | ICD-10-CM

## 2018-12-28 DIAGNOSIS — I10 Essential (primary) hypertension: Secondary | ICD-10-CM

## 2018-12-28 NOTE — Telephone Encounter (Signed)
Patient is calling back requesting trefill on HYDROcodone-acetaminophen (NORCO/VICODIN) 5-325 MG tablet [[id number]]   alson in desperate need for her BP but. Was unable to tell me which med fir her BP. Please reach out to patient    FR

## 2018-12-28 NOTE — Telephone Encounter (Signed)
Please advise  Patient is requesting a refill of the following medications: Requested Prescriptions   Pending Prescriptions Disp Refills  . HYDROcodone-acetaminophen (NORCO/VICODIN) 5-325 MG tablet 120 tablet 0    Sig: Take 1 tablet by mouth every 6 (six) hours as needed for moderate pain. For chronic pain    Date of patient request: 12/26/18 Last office visit: 10/20/18 Date of last refill: 11/03/18 Last refill amount: 120 tab, 0 refills Follow up time period per chart: 01/23/19

## 2018-12-29 MED ORDER — LISINOPRIL 40 MG PO TABS: ORAL_TABLET | ORAL | 1 refills | Status: DC

## 2018-12-29 MED ORDER — AMLODIPINE BESYLATE 5 MG PO TABS: ORAL_TABLET | ORAL | 1 refills | Status: DC

## 2018-12-29 MED ORDER — HYDROCODONE-ACETAMINOPHEN 5-325 MG PO TABS: 1.0000 | ORAL_TABLET | Freq: Four times a day (QID) | ORAL | 0 refills | Status: DC | PRN

## 2019-01-03 ENCOUNTER — Other Ambulatory Visit: Payer: Self-pay

## 2019-01-03 ENCOUNTER — Ambulatory Visit (INDEPENDENT_AMBULATORY_CARE_PROVIDER_SITE_OTHER): Payer: Medicare Other | Admitting: Orthopaedic Surgery

## 2019-01-03 ENCOUNTER — Encounter: Payer: Self-pay | Admitting: Orthopaedic Surgery

## 2019-01-03 VITALS — Ht 62.0 in | Wt 208.0 lb

## 2019-01-03 DIAGNOSIS — Z6838 Body mass index (BMI) 38.0-38.9, adult: Secondary | ICD-10-CM

## 2019-01-03 DIAGNOSIS — IMO0002 Reserved for concepts with insufficient information to code with codable children: Secondary | ICD-10-CM

## 2019-01-03 DIAGNOSIS — M1712 Unilateral primary osteoarthritis, left knee: Secondary | ICD-10-CM | POA: Diagnosis not present

## 2019-01-03 DIAGNOSIS — M1710 Unilateral primary osteoarthritis, unspecified knee: Secondary | ICD-10-CM

## 2019-01-03 MED ORDER — LIDOCAINE HCL 1 % IJ SOLN
2.0000 mL | INTRAMUSCULAR | Status: AC | PRN
  Administered 2019-01-03: 15:00:00 2 mL

## 2019-01-03 MED ORDER — BUPIVACAINE HCL 0.5 % IJ SOLN
2.0000 mL | INTRAMUSCULAR | Status: AC | PRN
  Administered 2019-01-03: 2 mL via INTRA_ARTICULAR

## 2019-01-03 MED ORDER — METHYLPREDNISOLONE ACETATE 40 MG/ML IJ SUSP
80.0000 mg | INTRAMUSCULAR | Status: AC | PRN
  Administered 2019-01-03: 80 mg via INTRA_ARTICULAR

## 2019-01-03 NOTE — Progress Notes (Signed)
Office Visit Note   Patient: Casey [name]           Date of Birth: April 07, 1935           MRN: [id number] Visit Date: 01/03/2019              Requested by: Myles Lipps, MD [address] [place],  [place] [address] PCP: Myles Lipps, MD   Assessment & Plan: Visit Diagnoses:  1. Class 2 severe obesity due to excess calories with serious comorbidity and body mass index (BMI) of 38.0 to 38.9 in adult [facility])   2. Osteoarthrosis involving lower leg     Plan: Primary osteoarthritis both knees.  11 days ago injected the right knee and did well.  Because patient was diabetic I did not want to inject both knees so had her come back today for injecting the left knee.  Injection was performed without difficulty and we'll plan to see her back as needed  Follow-Up Instructions: Return if symptoms worsen or fail to improve.   Orders:  Orders Placed This Encounter  Procedures  . Large Joint Inj: L knee   No orders of the defined types were placed in this encounter.     Procedures: Large Joint Inj: L knee on 01/03/2019 3:25 PM Indications: pain and diagnostic evaluation Details: 25 G 1.5 in needle, anteromedial approach  Arthrogram: No  Medications: 2 mL lidocaine 1 %; 2 mL bupivacaine 0.5 %; 80 mg methylPREDNISolone acetate 40 MG/ML Outcome: tolerated well, no immediate complications Procedure, treatment alternatives, risks and benefits explained, specific risks discussed. Consent was given by the patient. Patient was prepped and draped in the usual sterile fashion.       Clinical Data: No additional findings.   Subjective: Chief Complaint  Patient presents with  . Right Knee - Pain  . Left Knee - Pain  Patient presents today for follow up on her knees. She was last seen 12 days ago and received a cortisone injection in right knee. Her right knee is feeling great, and has no complaints. She returns today to get a cortisone injection in her left knee.   HPI  Review of  Systems  Constitutional: Negative for fatigue.  HENT: Negative for ear pain.   Eyes: Positive for pain.  Respiratory: Negative for shortness of breath.   Cardiovascular: Negative for leg swelling.  Gastrointestinal: Negative for constipation and diarrhea.  Endocrine: Positive for cold intolerance. Negative for heat intolerance.  Genitourinary: Negative for difficulty urinating.  Musculoskeletal: Positive for joint swelling.  Skin: Negative for rash.  Allergic/Immunologic: Negative for food allergies.  Neurological: Negative for weakness.  Hematological: Does not bruise/bleed easily.  Psychiatric/Behavioral: Positive for sleep disturbance.     Objective: Vital Signs: Ht 5\' 2"  (1.575 m)   Wt 208 lb (94.3 kg)   BMI 38.04 kg/m   Physical Exam Constitutional:      Appearance: She is well-developed.  Eyes:     Pupils: Pupils are equal, round, and reactive to light.  Pulmonary:     Effort: Pulmonary effort is normal.  Skin:    General: Skin is warm and dry.  Neurological:     Mental Status: She is alert and oriented to person, place, and time.  Psychiatric:        Behavior: Behavior normal.     Ortho Exam large knees.  No problems referable to the right knee after the injection 11 days ago.  Comparable range of motion of both right and  left knee.  No effusion either knee.  Predominant medial joint pain left knee mild ankle edema.  Good capillary refill to toes  Specialty Comments:  No specialty comments available.  Imaging: No results found.   PMFS History: Patient Active Problem List   Diagnosis Date Noted  . Preoperative cardiovascular examination 05/13/2018  . Vitamin B12 deficiency 07/14/2017  . DJD (degenerative joint disease) of knee 07/07/2013  . Type 2 diabetes mellitus with microalbuminuria, without long-term current use of insulin (HCC) 05/26/2012  . Anemia 10/23/2011  . Encounter for medication monitoring 10/23/2011  . Hyponatremia 12/01/2010    Class:  Acute  . Chest pain with moderate risk for cardiac etiology 11/26/2010    Class: Acute  . CYSTOCELE WITH INCOMPLETE UTERINE PROLAPSE 03/10/2010  . HYPERGLYCEMIA 02/06/2010  . ESOPHAGEAL STRICTURE 12/11/2009  . Obesity 12/09/2009  . PROBLEMS WITH SWALLOWING AND MASTICATION 12/09/2009  . MICROSCOPIC HEMATURIA 07/29/2009  . ARM PAIN, RIGHT 03/28/2009  . Fatigue 03/28/2009  . CONSTIPATION 09/25/2008  . Sinusitis, chronic 02/23/2008  . Osteoarthrosis involving lower leg 09/28/2007  . LEG EDEMA, BILATERAL 08/31/2007  . FLANK PAIN, LEFT 06/08/2007  . SPRAIN&STRAIN OTH SPEC SITES SHOULDER&UPPER ARM 06/08/2007  . BENIGN POSITIONAL VERTIGO 04/25/2007  . Headache 03/24/2007  . HYPERCHOLESTEROLEMIA 03/03/2007  . Anxiety state 03/03/2007  . HYPERTENSION, BENIGN ESSENTIAL 03/03/2007  . Allergic rhinitis 03/03/2007  . HIP PAIN, LEFT 03/03/2007  . Osteoporosis 03/03/2007   Past Medical History:  Diagnosis Date  . ALLERGIC RHINITIS   . Allergy   . ANXIETY   . ARM PAIN, RIGHT   . BENIGN POSITIONAL VERTIGO   . Carcinoma in situ of colon   . CONSTIPATION   . CYSTOCELE WITH INCOMPLETE UTERINE PROLAPSE   . ESOPHAGEAL STRICTURE   . FATIGUE   . FLANK PAIN, LEFT   . Headache(784.0)   . HIP PAIN, LEFT   . HYPERCHOLESTEROLEMIA   . HYPERGLYCEMIA   . HYPERTENSION, BENIGN ESSENTIAL 03/03/2007  . LEG EDEMA, BILATERAL   . Microscopic hematuria   . OBESITY   . OSTEOARTHRITIS, KNEE   . OSTEOPOROSIS   . Peripheral neuropathy, idiopathic   . Problems with swallowing and mastication   . SINUSITIS, RECURRENT   . SPRAIN&STRAIN OTH SPEC SITES SHOULDER&UPPER ARM     Family History  Problem Relation Age of Onset  . Coronary artery disease Mother 47  . Cancer Father 71  . Heart disease Father   . Colon cancer Other   . Diabetes Other   . Coronary artery disease Sister   . Diabetes type II Sister   . Cancer Sister   . Coronary artery disease Brother   . Kidney disease Brother   . Coronary artery  disease Brother   . Breast cancer Neg Hx     Past Surgical History:  Procedure Laterality Date  . LEFT HEART CATHETERIZATION WITH CORONARY ANGIOGRAM N/A 12/02/2010   Procedure: LEFT HEART CATHETERIZATION WITH CORONARY ANGIOGRAM;  Surgeon: Shaterria Sager M [place], MD;  Location: [facility] C[facility] LAB;  Service: Cardiovascular;:: NORMAL CORONARY ARTERIES - NORMAL LV FUNCTION & EDP.    Marland Kitchen TRANSTHORACIC ECHOCARDIOGRAM  11/2010   Normal EF 60-65%. No RWMA.  Mild LAD dilation c/w Gr 1 DD.  -- PRETTY NORMAL ECHO   Social History   Occupational History  . Not on file  Tobacco Use  . Smoking status: Never Smoker  . Smokeless tobacco: Never Used  Substance and Sexual Activity  . Alcohol use: No  . Drug use: No  .  Sexual activity: Not Currently

## 2019-01-04 ENCOUNTER — Ambulatory Visit: Payer: Medicare Other

## 2019-01-23 ENCOUNTER — Encounter: Payer: Self-pay | Admitting: Family Medicine

## 2019-01-23 ENCOUNTER — Ambulatory Visit (INDEPENDENT_AMBULATORY_CARE_PROVIDER_SITE_OTHER): Payer: Medicare Other | Admitting: Family Medicine

## 2019-01-23 ENCOUNTER — Other Ambulatory Visit: Payer: Self-pay

## 2019-01-23 VITALS — BP 140/77 | HR 74 | Temp 98.4°F | Wt 209.2 lb

## 2019-01-23 DIAGNOSIS — E1129 Type 2 diabetes mellitus with other diabetic kidney complication: Secondary | ICD-10-CM

## 2019-01-23 DIAGNOSIS — G609 Hereditary and idiopathic neuropathy, unspecified: Secondary | ICD-10-CM

## 2019-01-23 DIAGNOSIS — F411 Generalized anxiety disorder: Secondary | ICD-10-CM | POA: Diagnosis not present

## 2019-01-23 DIAGNOSIS — I10 Essential (primary) hypertension: Secondary | ICD-10-CM | POA: Diagnosis not present

## 2019-01-23 DIAGNOSIS — E7800 Pure hypercholesterolemia, unspecified: Secondary | ICD-10-CM | POA: Diagnosis not present

## 2019-01-23 DIAGNOSIS — R809 Proteinuria, unspecified: Secondary | ICD-10-CM | POA: Diagnosis not present

## 2019-01-23 MED ORDER — ALPRAZOLAM 0.25 MG PO TABS: 0.2500 mg | ORAL_TABLET | Freq: Every evening | ORAL | 1 refills | Status: DC | PRN

## 2019-01-23 MED ORDER — GABAPENTIN 600 MG PO TABS: ORAL_TABLET | ORAL | 0 refills | Status: DC

## 2019-01-23 MED ORDER — ATORVASTATIN CALCIUM 10 MG PO TABS: 10.0000 mg | ORAL_TABLET | Freq: Every day | ORAL | 3 refills | Status: DC

## 2019-01-23 NOTE — Progress Notes (Signed)
1/4/202110:52 AM  Casey [name] Jul 03, 1935, 84 y.o., female [id number]  Chief Complaint  Patient presents with  . Medical Management of Chronic Issues    3 month f/u patient also stated she need a refill on her alprazolam. GAD7=8 PHQ9=5.     HPI:   Patient is a 84 y.o. female with past medical history significant for GAD, DM2 with peripheral neuropathy and microabuminuria, HTN, HLP, OA of knee, vitamin b12 deficiency, osteoporosis, seasonal allergies,who presents today for followup  Last OV Oct 2020 -started on buspar She reports that buspar "were no good" She reports that only alprazolam helps Trial: lexapro, sertraline, buspar pmp reviewed She does not check bp or glucose Recently had knee steroid injection with ortho - doing well Walks inside her house, monitors her portions, would like to lose weight She takes her other meds as prescribed Denies any side effects Has no acute concerns today  Lab Results  Component Value Date   HGBA1C 6.7 (H) 10/20/2018   HGBA1C 6.6 (H) 04/04/2018   HGBA1C 6.6 (A) 01/03/2018   Lab Results  Component Value Date   MICROALBUR 0.7 10/11/2014   LDLCALC 134 (H) 10/20/2018   CREATININE 0.90 10/20/2018   GAD 7 : Generalized Anxiety Score 01/23/2019 09/08/2018 09/08/2018 08/09/2018  Nervous, Anxious, on Edge 3 2 2 3   Control/stop worrying 3 3 3 3   Worry too much - different things 0 3 3 3   Trouble relaxing 2 3 3 3   Restless 0 1 1 0  Easily annoyed or irritable 0 2 2 0  Afraid - awful might happen 0 2 2 3   Total GAD 7 Score 8 16 16 15   Anxiety Difficulty - - Somewhat difficult -     Depression screen [employer] 2/9 01/23/2019 01/23/2019 10/20/2018  Decreased Interest 0 0 0  Down, Depressed, Hopeless 1 0 0  PHQ - 2 Score 1 0 0  Altered sleeping 2 - -  Tired, decreased energy 1 - -  Change in appetite 0 - -  Feeling bad or failure about yourself  0 - -  Trouble concentrating 1 - -  Moving slowly or fidgety/restless 0 - -  Suicidal thoughts 0  - -  PHQ-9 Score 5 - -  Difficult doing work/chores - - -  Some recent data might be hidden    Fall Risk  01/23/2019 10/20/2018 10/12/2018 09/08/2018 08/09/2018  Falls in the past year? 0 0 0 0 0  Number falls in past yr: 0 0 0 0 0  Injury with Fall? 0 0 0 0 0  Follow up Falls evaluation completed - Falls evaluation completed;Education provided;Falls prevention discussed - -     No Known Allergies  Prior to Admission medications   Medication Sig Start Date End Date Taking? Authorizing Provider  ALPRAZolam (XANAX) 0.25 MG tablet Take 1 tablet (0.25 mg total) by mouth at bedtime as needed for anxiety. 10/20/18  Yes 03/23/2019, MD  amLODipine (NORVASC) 5 MG tablet TAKE 1 TABLET(5 MG) BY MOUTH DAILY 12/29/18  Yes 10/14/2018, MD  aspirin EC 81 MG tablet Take 1 tablet (81 mg total) by mouth daily. 08/21/15  Yes 08/11/2018, MD  azelastine (ASTELIN) 0.1 % nasal spray USE 1 SPRAY IN EACH NOSTRIL TWICE DAILY AS DIRECTED 07/14/18  Yes Myles Lipps, MD  azelastine (OPTIVAR) 0.05 % ophthalmic solution INSTILL 1 DROP IN BOTH EYES TWICE DAILY 08/11/18  Yes Myles Lipps, MD  busPIRone (BUSPAR) 5 MG tablet Take  1 tablet (5 mg total) by mouth 2 (two) times daily. For anxiety 10/20/18  Yes Rutherford Guys, MD  fluticasone [facility]) 50 MCG/ACT nasal spray Place 1 spray into both nostrils 2 (two) times daily. 09/08/18  Yes Rutherford Guys, MD  gabapentin (NEURONTIN) 600 MG tablet TAKE 1 TABLET(600 MG) BY MOUTH THREE TIMES DAILY 11/14/18  Yes Rutherford Guys, MD  glucose blood test strip 1 each by Other route daily. Use as instructed 09/08/16  Yes [name], MD  HYDROcodone-acetaminophen (NORCO/VICODIN) 5-325 MG tablet Take 1 tablet by mouth every 6 (six) hours as needed for moderate pain. For chronic pain 12/29/18  Yes Rutherford Guys, MD  lisinopril (ZESTRIL) 40 MG tablet TAKE 1 TABLET(40 MG) BY MOUTH DAILY 12/29/18  Yes Rutherford Guys, MD  meclizine (ANTIVERT) 25 MG tablet Take 1 tablet (25  mg total) by mouth 3 (three) times daily as needed for dizziness. 10/20/18  Yes Rutherford Guys, MD  metFORMIN (GLUCOPHAGE) 500 MG tablet TAKE 1 TABLET(500 MG) BY MOUTH TWICE DAILY WITH A MEAL 11/17/18  Yes Rutherford Guys, MD  omeprazole (PRILOSEC) 40 MG capsule Take 1 capsule (40 mg total) by mouth daily. 10/20/18  Yes Rutherford Guys, MD    Past Medical History:  Diagnosis Date  . ALLERGIC RHINITIS   . Allergy   . ANXIETY   . ARM PAIN, RIGHT   . BENIGN POSITIONAL VERTIGO   . Carcinoma in situ of colon   . CONSTIPATION   . CYSTOCELE WITH INCOMPLETE UTERINE PROLAPSE   . ESOPHAGEAL STRICTURE   . FATIGUE   . FLANK PAIN, LEFT   . Headache(784.0)   . HIP PAIN, LEFT   . HYPERCHOLESTEROLEMIA   . HYPERGLYCEMIA   . HYPERTENSION, BENIGN ESSENTIAL 03/03/2007  . LEG EDEMA, BILATERAL   . Microscopic hematuria   . OBESITY   . OSTEOARTHRITIS, KNEE   . OSTEOPOROSIS   . Peripheral neuropathy, idiopathic   . Problems with swallowing and mastication   . SINUSITIS, RECURRENT   . SPRAIN&STRAIN OTH SPEC SITES SHOULDER&UPPER ARM     Past Surgical History:  Procedure Laterality Date  . LEFT HEART CATHETERIZATION WITH CORONARY ANGIOGRAM N/A 12/02/2010   Procedure: LEFT HEART CATHETERIZATION WITH CORONARY ANGIOGRAM;  Surgeon: Peter M Martinique, MD;  Location: [facility] CATH LAB;  Service: Cardiovascular;:: NORMAL CORONARY ARTERIES - NORMAL LV FUNCTION & EDP.    Marland Kitchen TRANSTHORACIC ECHOCARDIOGRAM  11/2010   Normal EF 60-65%. No RWMA.  Mild LAD dilation c/w Gr 1 DD.  -- PRETTY NORMAL ECHO    Social History   Tobacco Use  . Smoking status: Never Smoker  . Smokeless tobacco: Never Used  Substance Use Topics  . Alcohol use: No    Family History  Problem Relation Age of Onset  . Coronary artery disease Mother 98  . Cancer Father 30  . Heart disease Father   . Colon cancer Other   . Diabetes Other   . Coronary artery disease Sister   . Diabetes type II Sister   . Cancer Sister   . Coronary artery  disease Brother   . Kidney disease Brother   . Coronary artery disease Brother   . Breast cancer Neg Hx     Review of Systems  Constitutional: Negative for chills and fever.  Respiratory: Negative for cough and shortness of breath.   Cardiovascular: Negative for chest pain, palpitations and leg swelling.  Gastrointestinal: Negative for abdominal pain, nausea and vomiting.   Per  hpi  OBJECTIVE:  Today's Vitals   01/23/19 1028  BP: 140/77  Pulse: 74  Temp: 98.4 F (36.9 C)  TempSrc: Temporal  SpO2: 98%  Weight: 209 lb 3.2 oz (94.9 kg)   Body mass index is 38.26 kg/m.  Wt Readings from Last 3 Encounters:  01/23/19 209 lb 3.2 oz (94.9 kg)  01/03/19 208 lb (94.3 kg)  12/22/18 208 lb (94.3 kg)    Physical Exam Vitals and nursing note reviewed.  Constitutional:      Appearance: She is well-developed.  HENT:     Head: Normocephalic and atraumatic.     Mouth/Throat:     Pharynx: No oropharyngeal exudate.  Eyes:     General: No scleral icterus.    Conjunctiva/sclera: Conjunctivae normal.     Pupils: Pupils are equal, round, and reactive to light.  Cardiovascular:     Rate and Rhythm: Normal rate and regular rhythm.     Heart sounds: Normal heart sounds. No murmur. No friction rub. No gallop.   Pulmonary:     Effort: Pulmonary effort is normal.     Breath sounds: Normal breath sounds. No wheezing or rales.  Musculoskeletal:     Cervical back: Neck supple.  Skin:    General: Skin is warm and dry.  Neurological:     Mental Status: She is alert and oriented to person, place, and time.     No results found for this or any previous visit (from the past 24 hour(s)).  No results found.   ASSESSMENT and PLAN  1. GAD (generalized anxiety disorder) Stable. pmp reviewed. Med refilled. Reviewed r/se/b - ALPRAZolam (XANAX) 0.25 MG tablet; Take 1 tablet (0.25 mg total) by mouth at bedtime as needed for anxiety.  2. Hereditary and idiopathic peripheral  neuropathy Stable. Cont current regime - gabapentin (NEURONTIN) 600 MG tablet; TAKE 1 TABLET(600 MG) BY MOUTH THREE TIMES DAILY  3. Type 2 diabetes mellitus with microalbuminuria, without long-term current use of insulin (HCC) Controlled. Continue current regime.  - CMET with GFR  4. HYPERCHOLESTEROLEMIA LDL 134, has DM, starting low dose statin.   5. HYPERTENSION, BENIGN ESSENTIAL Controlled. Continue current regime.   Other orders - atorvastatin (LIPITOR) 10 MG tablet; Take 1 tablet (10 mg total) by mouth daily.  Return in about 6 months (around 07/23/2019).    Myles Lipps, MD Primary Care at [facility] [address] [place], [place] [address] Ph.  [phone] Fax [phone]

## 2019-01-23 NOTE — Patient Instructions (Signed)
° ° ° °  If you have lab work done today you will be contacted with your lab results within the next 2 weeks.  If you have not heard from us then please contact us. The fastest way to get your results is to register for My Chart. ° ° °IF you received an x-ray today, you will receive an invoice from [facility]. Please contact [facility] at [phone] with questions or concerns regarding your invoice.  ° °IF you received labwork today, you will receive an invoice from [facility]. Please contact [facility] at [phone] with questions or concerns regarding your invoice.  ° °Our billing staff will not be able to assist you with questions regarding bills from these companies. ° °You will be contacted with the lab results as soon as they are available. The fastest way to get your results is to activate your My Chart account. Instructions are located on the last page of this paperwork. If you have not heard from us regarding the results in 2 weeks, please contact this office. °  ° ° ° °

## 2019-01-24 LAB — CMP14+EGFR
ALT: 14 IU/L (ref 0–32)
AST: 15 IU/L (ref 0–40)
Albumin/Globulin Ratio: 1.3 (ref 1.2–2.2)
Albumin: 4 g/dL (ref 3.6–4.6)
Alkaline Phosphatase: 100 IU/L (ref 39–117)
BUN/Creatinine Ratio: 19 (ref 12–28)
BUN: 15 mg/dL (ref 8–27)
Bilirubin Total: 0.2 mg/dL (ref 0.0–1.2)
CO2: 27 mmol/L (ref 20–29)
Calcium: 9.3 mg/dL (ref 8.7–10.3)
Chloride: 98 mmol/L (ref 96–106)
Creatinine, Ser: 0.79 mg/dL (ref 0.57–1.00)
GFR calc Af Amer: 81 mL/min/{1.73_m2} (ref >59)
GFR calc non Af Amer: 70 mL/min/{1.73_m2} (ref >59)
Globulin, Total: 3 g/dL (ref 1.5–4.5)
Glucose: 113 mg/dL — ABNORMAL HIGH (ref 65–99)
Potassium: 4.1 mmol/L (ref 3.5–5.2)
Sodium: 138 mmol/L (ref 134–144)
Total Protein: 7 g/dL (ref 6.0–8.5)

## 2019-01-26 ENCOUNTER — Telehealth: Payer: Self-pay | Admitting: Family Medicine

## 2019-01-26 DIAGNOSIS — F411 Generalized anxiety disorder: Secondary | ICD-10-CM

## 2019-01-26 NOTE — Telephone Encounter (Signed)
pcpCopied from CRM [id number]. Topic: General - Other >> Jan 26, 2019 11:10 AM Marylen Ponto wrote: Reason for CRM: Pt stated she needs to speak with a nurse to discuss lab results and her medications. Pt stated she was prescribed atorvastatin (LIPITOR) 10 MG tablet but it was not explained to her why. Pt also stated she received a Rx for ALPRAZolam (XANAX) 0.25 MG tablet and she usually takes 2 because 1 does not do anything. Pt requests call back.

## 2019-01-31 NOTE — Telephone Encounter (Signed)
I renewed the same prescription she has been on since July 2020. I have no intention of increasing quantity. thanks

## 2019-01-31 NOTE — Telephone Encounter (Signed)
Please Advise

## 2019-02-01 NOTE — Telephone Encounter (Signed)
Spoke with pt regarding your response with her concerns and she stated that she would like for you to refer her to a neuro bc of her nerves bc she is not sleeping. Please Advise

## 2019-02-02 NOTE — Telephone Encounter (Signed)
Pt has been informed.

## 2019-02-02 NOTE — Telephone Encounter (Signed)
Please inform patient that referral has been made. thanks

## 2019-02-10 ENCOUNTER — Other Ambulatory Visit: Payer: Self-pay | Admitting: Family Medicine

## 2019-02-10 DIAGNOSIS — G609 Hereditary and idiopathic neuropathy, unspecified: Secondary | ICD-10-CM

## 2019-02-20 ENCOUNTER — Telehealth: Payer: Self-pay | Admitting: Family Medicine

## 2019-02-20 DIAGNOSIS — M170 Bilateral primary osteoarthritis of knee: Secondary | ICD-10-CM

## 2019-02-20 MED ORDER — HYDROCODONE-ACETAMINOPHEN 5-325 MG PO TABS: 1.0000 | ORAL_TABLET | Freq: Four times a day (QID) | ORAL | 0 refills | Status: DC | PRN

## 2019-02-20 NOTE — Telephone Encounter (Signed)
Pmp reviewed Med refilled 

## 2019-02-20 NOTE — Telephone Encounter (Signed)
Please advise 

## 2019-02-20 NOTE — Telephone Encounter (Signed)
Medication Refill - Medication: HYDROcodone-acetaminophen (NORCO/VICODIN) 5-325 MG tablet  Pt has 6 pills left   Has the patient contacted their pharmacy? No. (Agent: If no, request that the patient contact the pharmacy for the refill.) (Agent: If yes, when and what did the pharmacy advise?)  Preferred Pharmacy (with phone number or street name):  [facility] [phone] - [place], [place] - [address] AT [facility] [address] Squ[place] Phone:  [phone]  Fax:  [phone]       Agent: Please be advised that RX refills may take up to 3 business days. We ask that you follow-up with your pharmacy.

## 2019-02-20 NOTE — Telephone Encounter (Signed)
Request for non delegated Rx refill 

## 2019-02-22 ENCOUNTER — Ambulatory Visit
Admission: RE | Admit: 2019-02-22 | Discharge: 2019-02-22 | Disposition: A | Discharge status: Home / Self Care | Payer: Medicare Other | Source: Ambulatory Visit | Attending: Family Medicine | Admitting: Family Medicine

## 2019-02-22 ENCOUNTER — Other Ambulatory Visit: Payer: Self-pay

## 2019-02-22 DIAGNOSIS — Z1231 Encounter for screening mammogram for malignant neoplasm of breast: Secondary | ICD-10-CM

## 2019-02-27 ENCOUNTER — Telehealth: Payer: Self-pay | Admitting: Emergency Medicine

## 2019-02-27 NOTE — Telephone Encounter (Signed)
Hydrocodone need a prior auth per [facility]

## 2019-02-28 NOTE — Telephone Encounter (Signed)
Can you look into this prior auth

## 2019-02-28 NOTE — Telephone Encounter (Signed)
Pt called about refill for Hydrocodone and I advised her that PA was needed / please advise

## 2019-03-01 NOTE — Telephone Encounter (Addendum)
Pt called and states that she has been told to make an OV before she can get a refill, please advise. Pt wants a call back from Clinic   Best contact: [phone]

## 2019-03-02 ENCOUNTER — Telehealth: Payer: Self-pay | Admitting: Family Medicine

## 2019-03-02 NOTE — Telephone Encounter (Signed)
The Prior Berkley Harvey is still being reviewed

## 2019-03-02 NOTE — Telephone Encounter (Signed)
Pt called about Hydrocodone refill. Talked to Raynelle Fanning and she said she was putting authorization in. Please asdvise

## 2019-03-02 NOTE — Telephone Encounter (Signed)
error 

## 2019-03-06 NOTE — Telephone Encounter (Signed)
Medication approved hydrocodone/acetaminophen by cover my meds.

## 2019-03-20 ENCOUNTER — Other Ambulatory Visit: Payer: Self-pay | Admitting: Family Medicine

## 2019-03-20 DIAGNOSIS — F411 Generalized anxiety disorder: Secondary | ICD-10-CM

## 2019-03-20 NOTE — Telephone Encounter (Signed)
pmp reviewd, appropriate meds refilled 

## 2019-03-20 NOTE — Telephone Encounter (Signed)
Patient is requesting a refill of the following medications: Requested Prescriptions   Pending Prescriptions Disp Refills  . ALPRAZolam (XANAX) 0.25 MG tablet [Pharmacy Med Name: ALPRAZOLAM 0.25MG  TABLETS] 30 tablet     Sig: TAKE 1 TABLET(0.25 MG) BY MOUTH AT BEDTIME AS NEEDED FOR ANXIETY  . busPIRone (BUSPAR) 5 MG tablet [Pharmacy Med Name: BUSPIRONE 5MG  TABLETS] 60 tablet 3    Sig: TAKE 1 TABLET(5 MG) BY MOUTH TWICE DAILY FOR ANXIETY    Date of patient request: 03/20/2019 Last office visit: 01/23/2019 Date of last refill: 01/23/2019 Last refill amount: 30 tablets 1 refill Follow up time period per chart: Follow up appointment scheduled   Buspirone Expired on 01/23/2019  60 tablets 3 Refills were given on 10/20/2018

## 2019-03-20 NOTE — Telephone Encounter (Signed)
Requested medication (s) are due for refill today: Clonazepam, yes  Requested medication (s) are on the active medication list: yes  Last refill: 02/23/19  Future visit scheduled: yes  Notes to clinic:  not delegated  Requested medication (s) are due for refill today: Buspirone, yes  Requested medication (s) are on the active medication list: no  Last refill: 02/06/19  Future visit scheduled: yes  Notes to clinic:  not on active med list; d/c'd 01/23/19  Requested Prescriptions  Pending Prescriptions Disp Refills   ALPRAZolam (XANAX) 0.25 MG tablet [Pharmacy Med Name: ALPRAZOLAM 0.25MG  TABLETS] 30 tablet     Sig: TAKE 1 TABLET(0.25 MG) BY MOUTH AT BEDTIME AS NEEDED FOR ANXIETY      Not Delegated - Psychiatry:  Anxiolytics/Hypnotics Failed - 03/20/2019 11:06 AM      Failed - This refill cannot be delegated      Failed - Urine Drug Screen completed in last 360 days.      Passed - Valid encounter within last 6 months    Recent Outpatient Visits           1 month ago Type 2 diabetes mellitus with microalbuminuria, without long-term current use of insulin (HCC)   Primary Care at [facility], [facility], MD   5 months ago HYPERTENSION, BENIGN ESSENTIAL   [facility] at [facility], Meda Coffee, MD   5 months ago Medicare annual wellness visit, subsequent   Primary Care at [facility], [place] A, MD   6 months ago GAD (generalized anxiety disorder)   [facility] at [facility], Meda Coffee, MD   7 months ago GAD (generalized anxiety disorder)   [facility] at [facility], Meda Coffee, MD       Future Appointments             In 3 months Myles Lipps, MD Primary Care at [place], [facility]              busPIRone (BUSPAR) 5 MG tablet [Pharmacy Med Name: BUSPIRONE 5MG  TABLETS] 60 tablet 3    Sig: TAKE 1 TABLET(5 MG) BY MOUTH TWICE DAILY FOR ANXIETY      Psychiatry: Anxiolytics/Hypnotics - Non-controlled Passed - 03/20/2019 11:06 AM      Passed - Valid encounter within last 6  months    Recent Outpatient Visits           1 month ago Type 2 diabetes mellitus with microalbuminuria, without long-term current use of insulin (HCC)   Primary Care at 05/20/2019, [facility], MD   5 months ago HYPERTENSION, BENIGN ESSENTIAL   [facility] at [facility], [facility], MD   5 months ago Medicare annual wellness visit, subsequent   Primary Care at [facility], [facility] A, MD   6 months ago GAD (generalized anxiety disorder)   Primary Care at [place], [facility], MD   7 months ago GAD (generalized anxiety disorder)   [facility] at [facility], [facility], MD       Future Appointments             In 3 months Meda Coffee, MD Primary Care at [place], [facility]

## 2019-04-11 ENCOUNTER — Other Ambulatory Visit: Payer: Self-pay | Admitting: Family Medicine

## 2019-04-11 NOTE — Telephone Encounter (Signed)
Requested Prescriptions  Pending Prescriptions Disp Refills  . azelastine (OPTIVAR) 0.05 % ophthalmic solution [Pharmacy Med Name: AZELASTINE 0.05% OPHTH SOLUTION 6ML] 6 mL 1    Sig: INSTILL 1 DROP IN BOTH EYES TWICE DAILY     Ophthalmology:  Antiallergy Passed - 04/11/2019  6:32 AM      Passed - Valid encounter within last 12 months    Recent Outpatient Visits          2 months ago Type 2 diabetes mellitus with microalbuminuria, without long-term current use of insulin [facility])   [facility] at [facility], Meda Coffee, MD   5 months ago HYPERTENSION, BENIGN ESSENTIAL   [facility] at [facility], Meda Coffee, MD   6 months ago Medicare annual wellness visit, subsequent   Primary Care at [facility], [place] A, MD   7 months ago GAD (generalized anxiety disorder)   [facility] at [facility], Meda Coffee, MD   8 months ago GAD (generalized anxiety disorder)   [facility] at [facility], Meda Coffee, MD      Future Appointments            In 3 months Myles Lipps, MD Primary Care at [place], [facility]

## 2019-04-17 ENCOUNTER — Other Ambulatory Visit: Payer: Self-pay | Admitting: Family Medicine

## 2019-04-17 DIAGNOSIS — M170 Bilateral primary osteoarthritis of knee: Secondary | ICD-10-CM

## 2019-04-17 NOTE — Telephone Encounter (Signed)
Please let patient know that I have denied her vicodin, 120 tabs have always been for 2 month supply. She is requesting 2 weeks early. thanks

## 2019-04-17 NOTE — Telephone Encounter (Signed)
Medication Refill - Medication:  HYDROcodone-acetaminophen (NORCO/VICODIN) 5-325 MG tablet    Has the patient contacted their pharmacy?  Yes advised to call office.   Preferred Pharmacy (with phone number or street name):  [facility] [phone] - [place], [place] - [address] AT [facility] [address] Phone:  [phone]  Fax:  [phone]       Agent: Please be advised that RX refills may take up to 3 business days. We ask that you follow-up with your pharmacy.

## 2019-04-17 NOTE — Telephone Encounter (Signed)
Requested medication (s) are due for refill today: yes  Requested medication (s) are on the active medication list: yes  Last refill:  02/21/19  Future visit scheduled: yes  Notes to clinic:  not delegated   Requested Prescriptions  Pending Prescriptions Disp Refills   HYDROcodone-acetaminophen (NORCO/VICODIN) 5-325 MG tablet 120 tablet 0    Sig: Take 1 tablet by mouth every 6 (six) hours as needed for moderate pain. For chronic pain      Not Delegated - Analgesics:  Opioid Agonist Combinations Failed - 04/17/2019 10:22 AM      Failed - This refill cannot be delegated      Failed - Urine Drug Screen completed in last 360 days.      Passed - Valid encounter within last 6 months    Recent Outpatient Visits           2 months ago Type 2 diabetes mellitus with microalbuminuria, without long-term current use of insulin [facility])   [facility] Meda Coffee, MD   5 months ago HYPERTENSION, BENIGN ESSENTIAL   [facility], Meda Coffee, MD   6 months ago Medicare annual wellness visit, subsequent   Primary Care at [facility], [place] A, MD   7 months ago GAD (generalized anxiety disorder)   [facility], Meda Coffee, MD   8 months ago GAD (generalized anxiety disorder)   [facility], Meda Coffee, MD       Future Appointments             In 3 months Myles Lipps, MD Primary Care at [place], [facility]

## 2019-04-17 NOTE — Telephone Encounter (Signed)
Called and informed patient about medication denial.

## 2019-04-17 NOTE — Telephone Encounter (Signed)
Patient is requesting a refill of the following medications: Requested Prescriptions   Pending Prescriptions Disp Refills  . HYDROcodone-acetaminophen (NORCO/VICODIN) 5-325 MG tablet 120 tablet 0    Sig: Take 1 tablet by mouth every 6 (six) hours as needed for moderate pain. For chronic pain    Date of patient request: 04/17/2019 Last office visit: 01/23/2019 Date of last refill: 02/20/2019 Last refill amount: 120 tablets Follow up time period per chart: 07/17/2019

## 2019-04-19 ENCOUNTER — Encounter: Payer: Self-pay | Admitting: Podiatry

## 2019-04-19 ENCOUNTER — Other Ambulatory Visit: Payer: Self-pay

## 2019-04-19 ENCOUNTER — Ambulatory Visit (INDEPENDENT_AMBULATORY_CARE_PROVIDER_SITE_OTHER): Payer: Medicare Other | Admitting: Podiatry

## 2019-04-19 VITALS — Temp 97.5°F

## 2019-04-19 DIAGNOSIS — M79675 Pain in left toe(s): Secondary | ICD-10-CM | POA: Diagnosis not present

## 2019-04-19 DIAGNOSIS — E1142 Type 2 diabetes mellitus with diabetic polyneuropathy: Secondary | ICD-10-CM

## 2019-04-19 DIAGNOSIS — M79674 Pain in right toe(s): Secondary | ICD-10-CM

## 2019-04-19 DIAGNOSIS — B351 Tinea unguium: Secondary | ICD-10-CM | POA: Diagnosis not present

## 2019-04-19 NOTE — Patient Instructions (Signed)
Diabetes Mellitus and Foot Care Foot care is an important part of your health, especially when you have diabetes. Diabetes may cause you to have problems because of poor blood flow (circulation) to your feet and legs, which can cause your skin to:  Become thinner and drier.  Break more easily.  Heal more slowly.  Peel and crack. You may also have nerve damage (neuropathy) in your legs and feet, causing decreased feeling in them. This means that you may not notice minor injuries to your feet that could lead to more serious problems. Noticing and addressing any potential problems early is the best way to prevent future foot problems. How to care for your feet Foot hygiene  Wash your feet daily with warm water and mild soap. Do not use hot water. Then, pat your feet and the areas between your toes until they are completely dry. Do not soak your feet as this can dry your skin.  Trim your toenails straight across. Do not dig under them or around the cuticle. File the edges of your nails with an emery board or nail file.  Apply a moisturizing lotion or petroleum jelly to the skin on your feet and to dry, brittle toenails. Use lotion that does not contain alcohol and is unscented. Do not apply lotion between your toes. Shoes and socks  Wear clean socks or stockings every day. Make sure they are not too tight. Do not wear knee-high stockings since they may decrease blood flow to your legs.  Wear shoes that fit properly and have enough cushioning. Always look in your shoes before you put them on to be sure there are no objects inside.  To break in new shoes, wear them for just a few hours a day. This prevents injuries on your feet. Wounds, scrapes, corns, and calluses  Check your feet daily for blisters, cuts, bruises, sores, and redness. If you cannot see the bottom of your feet, use a mirror or ask someone for help.  Do not cut corns or calluses or try to remove them with medicine.  If you  find a minor scrape, cut, or break in the skin on your feet, keep it and the skin around it clean and dry. You may clean these areas with mild soap and water. Do not clean the area with peroxide, alcohol, or iodine.  If you have a wound, scrape, corn, or callus on your foot, look at it several times a day to make sure it is healing and not infected. Check for: ? Redness, swelling, or pain. ? Fluid or blood. ? Warmth. ? Pus or a bad smell. General instructions  Do not cross your legs. This may decrease blood flow to your feet.  Do not use heating pads or hot water bottles on your feet. They may burn your skin. If you have lost feeling in your feet or legs, you may not know this is happening until it is too late.  Protect your feet from hot and cold by wearing shoes, such as at the beach or on hot pavement.  Schedule a complete foot exam at least once a year (annually) or more often if you have foot problems. If you have foot problems, report any cuts, sores, or bruises to your health care provider immediately. Contact a health care provider if:  You have a medical condition that increases your risk of infection and you have any cuts, sores, or bruises on your feet.  You have an injury that is not   healing.  You have redness on your legs or feet.  You feel burning or tingling in your legs or feet.  You have pain or cramps in your legs and feet.  Your legs or feet are numb.  Your feet always feel cold.  You have pain around a toenail. Get help right away if:  You have a wound, scrape, corn, or callus on your foot and: ? You have pain, swelling, or redness that gets worse. ? You have fluid or blood coming from the wound, scrape, corn, or callus. ? Your wound, scrape, corn, or callus feels warm to the touch. ? You have pus or a bad smell coming from the wound, scrape, corn, or callus. ? You have a fever. ? You have a red line going up your leg. Summary  Check your feet every day  for cuts, sores, red spots, swelling, and blisters.  Moisturize feet and legs daily.  Wear shoes that fit properly and have enough cushioning.  If you have foot problems, report any cuts, sores, or bruises to your health care provider immediately.  Schedule a complete foot exam at least once a year (annually) or more often if you have foot problems. This information is not intended to replace advice given to you by your health care provider. Make sure you discuss any questions you have with your health care provider. Document Revised: 09/28/2018 Document Reviewed: 02/07/2016 Elsevier Patient Education  2020 [employer]vier Inc.  

## 2019-04-19 NOTE — Progress Notes (Signed)
Subjective: Casey [name] presents today for follow up of preventative diabetic foot care and painful mycotic nails b/l that are difficult to trim. Pain interferes with ambulation. Aggravating factors include wearing enclosed shoe gear. Pain is relieved with periodic professional debridement.   No Known Allergies   Objective: Vitals:   04/19/19 0933  Temp: (!) 97.5 F (36.4 C)    Pt 84 y.o. year old AA female  in NAD. AAO x 3.   Vascular Examination:  Capillary refill time to digits immediate b/l. Palpable DP pulses b/l. Palpable PT pulses b/l. Pedal hair absent b/l Skin temperature gradient within normal limits b/l.  Dermatological Examination: Pedal skin with normal turgor, texture and tone bilaterally. No open wounds bilaterally. No interdigital macerations bilaterally. Toenails 1-5 b/l elongated, dystrophic, thickened, crumbly with subungual debris and tenderness to dorsal palpation.  Musculoskeletal: Normal muscle strength 5/5 to all lower extremity muscle groups bilaterally, no pain crepitus or joint limitation noted with ROM b/l and pes planus deformity noted  Neurological: Protective sensation diminished with 10g monofilament b/l.  Assessment: 1. Pain due to onychomycosis of toenails of both feet   2. Diabetic peripheral neuropathy associated with type 2 diabetes mellitus (HCC)    Plan: -Continue diabetic foot care principles. Literature dispensed on today.  -Toenails 1-5 b/l were debrided in length and girth with sterile nail nippers and dremel without iatrogenic bleeding.  -Patient to continue soft, supportive shoe gear daily. -Patient to report any pedal injuries to medical professional immediately. -Patient/POA to call should there be question/concern in the interim.  Return in about 3 months (around 07/19/2019).

## 2019-04-24 ENCOUNTER — Other Ambulatory Visit: Payer: Self-pay | Admitting: Family Medicine

## 2019-04-24 DIAGNOSIS — F411 Generalized anxiety disorder: Secondary | ICD-10-CM

## 2019-04-24 NOTE — Telephone Encounter (Signed)
pmp reviewd, appropriate meds refilled 

## 2019-04-24 NOTE — Telephone Encounter (Signed)
Patient is requesting a refill of the following medications: Requested Prescriptions   Pending Prescriptions Disp Refills  . ALPRAZolam (XANAX) 0.25 MG tablet [Pharmacy Med Name: ALPRAZOLAM 0.25MG  TABLETS] 30 tablet     Sig: TAKE 1 TABLET(0.25 MG) BY MOUTH AT BEDTIME AS NEEDED FOR ANXIETY    Date of patient request: 04/24/2019 Last office visit: 01/23/2019 Date of last refill: 03/20/2019 Last refill amount: 30 tablets Follow up time period per chart: 07/17/2019

## 2019-05-03 ENCOUNTER — Other Ambulatory Visit: Payer: Self-pay | Admitting: Family Medicine

## 2019-05-03 DIAGNOSIS — M170 Bilateral primary osteoarthritis of knee: Secondary | ICD-10-CM

## 2019-05-03 NOTE — Telephone Encounter (Signed)
Patient is requesting a refill of the following medications: Requested Prescriptions   Pending Prescriptions Disp Refills  . HYDROcodone-acetaminophen (NORCO/VICODIN) 5-325 MG tablet 120 tablet 0    Sig: Take 1 tablet by mouth every 6 (six) hours as needed for moderate pain. For chronic pain    Date of patient request: 05/03/2019 Last office visit: 01/23/2019 Date of last refill: 02/20/2019 Last refill amount: 120 tablets  Follow up time period per chart: 07/17/2019

## 2019-05-03 NOTE — Telephone Encounter (Signed)
Requested medication (s) are due for refill today:  Yes  Requested medication (s) are on the active medication list:  Yes  Future visit scheduled:  Yes  Last Refill: 02/20/19; #120; no refills   Requested Prescriptions  Pending Prescriptions Disp Refills   HYDROcodone-acetaminophen (NORCO/VICODIN) 5-325 MG tablet 120 tablet 0    Sig: Take 1 tablet by mouth every 6 (six) hours as needed for moderate pain. For chronic pain      Not Delegated - Analgesics:  Opioid Agonist Combinations Failed - 05/03/2019 10:42 AM      Failed - This refill cannot be delegated      Failed - Urine Drug Screen completed in last 360 days.      Passed - Valid encounter within last 6 months    Recent Outpatient Visits           3 months ago Type 2 diabetes mellitus with microalbuminuria, without long-term current use of insulin [facility])   [facility], Meda Coffee, MD   6 months ago HYPERTENSION, BENIGN ESSENTIAL   [facility], Meda Coffee, MD   6 months ago Medicare annual wellness visit, subsequent   Primary Care at [facility], [place] A, MD   7 months ago GAD (generalized anxiety disorder)   [facility], Meda Coffee, MD   8 months ago GAD (generalized anxiety disorder)   [facility], Meda Coffee, MD       Future Appointments             In 1 week Valeria Batman, MD [facility]   In 2 months Myles Lipps, MD Primary Care at [place], [facility]

## 2019-05-03 NOTE — Telephone Encounter (Signed)
Patient requesting HYDROcodone-acetaminophen (NORCO/VICODIN) 5-325 MG tablet, informed please allow 48 to 72 hour turn around time  [employer] [phone] [place], [place] - [address] AT [facility] [address] & [facility] Phone:  [phone]  Fax:  [phone]

## 2019-05-04 MED ORDER — HYDROCODONE-ACETAMINOPHEN 5-325 MG PO TABS: 1.0000 | ORAL_TABLET | Freq: Four times a day (QID) | ORAL | 0 refills | Status: DC | PRN

## 2019-05-04 NOTE — Telephone Encounter (Signed)
pmp reviewd, appropriate meds refilled 

## 2019-05-11 ENCOUNTER — Other Ambulatory Visit: Payer: Self-pay | Admitting: Family Medicine

## 2019-05-11 ENCOUNTER — Encounter: Payer: Self-pay | Admitting: Orthopaedic Surgery

## 2019-05-11 ENCOUNTER — Other Ambulatory Visit: Payer: Self-pay

## 2019-05-11 ENCOUNTER — Ambulatory Visit (INDEPENDENT_AMBULATORY_CARE_PROVIDER_SITE_OTHER): Payer: Medicare Other | Admitting: Orthopaedic Surgery

## 2019-05-11 VITALS — Ht 62.0 in

## 2019-05-11 DIAGNOSIS — R809 Proteinuria, unspecified: Secondary | ICD-10-CM

## 2019-05-11 DIAGNOSIS — M1711 Unilateral primary osteoarthritis, right knee: Secondary | ICD-10-CM | POA: Diagnosis not present

## 2019-05-11 DIAGNOSIS — M170 Bilateral primary osteoarthritis of knee: Secondary | ICD-10-CM

## 2019-05-11 DIAGNOSIS — E1129 Type 2 diabetes mellitus with other diabetic kidney complication: Secondary | ICD-10-CM

## 2019-05-11 MED ORDER — LIDOCAINE HCL 1 % IJ SOLN
2.0000 mL | INTRAMUSCULAR | Status: AC | PRN
  Administered 2019-05-11: 2 mL

## 2019-05-11 MED ORDER — METHYLPREDNISOLONE ACETATE 40 MG/ML IJ SUSP
80.0000 mg | INTRAMUSCULAR | Status: AC | PRN
  Administered 2019-05-11: 17:00:00 80 mg via INTRA_ARTICULAR

## 2019-05-11 MED ORDER — BUPIVACAINE HCL 0.25 % IJ SOLN
2.0000 mL | INTRAMUSCULAR | Status: AC | PRN
  Administered 2019-05-11: 2 mL via INTRA_ARTICULAR

## 2019-05-11 NOTE — Progress Notes (Signed)
Office Visit Note   Patient: Casey [name]           Date of Birth: 02-19-1935           MRN: [id number] Visit Date: 05/11/2019              Requested by: Myles Lipps, MD [address] [place],  [place] [address] PCP: Myles Lipps, MD   Assessment & Plan: Visit Diagnoses:  1. Primary osteoarthritis of both knees     Plan:  #1: Corticosteroid injection to the right knee was accomplished atraumatically.   #2: Follow back up in 10 to 14 days for the corticosteroid injection to the left knee.  Follow-Up Instructions: No follow-ups on file.   Orders:  Orders Placed This Encounter  Procedures  . Large Joint Inj: R knee   No orders of the defined types were placed in this encounter.     Procedures: Large Joint Inj: R knee on 05/11/2019 5:26 PM Indications: pain and diagnostic evaluation Details: 25 G 1.5 in needle, anteromedial approach  Arthrogram: No  Medications: 2 mL lidocaine 1 %; 80 mg methylPREDNISolone acetate 40 MG/ML; 2 mL bupivacaine 0.25 % Outcome: tolerated well, no immediate complications Procedure, treatment alternatives, risks and benefits explained, specific risks discussed. Consent was given by the patient. Immediately prior to procedure a time out was called to verify the correct patient, procedure, equipment, support staff and site/side marked as required. Patient was prepped and draped in the usual sterile fashion.       Clinical Data: No additional findings.   Subjective: Chief Complaint  Patient presents with  . Right Knee - Pain  . Left Knee - Pain   HPI Patient presents today for bilateral knee pain. She said that her right knee is more painful than the left. She received cortisone injections 4 months ago in her knees. They were given on separate days due to being diabetic. She said that they last about 3 months. She takes hydrocodone for pain. She is wanting to get the cortisone injections again.    Review of Systems   Constitutional: Negative for fatigue.  HENT: Negative for ear pain.   Eyes: Negative for pain.  Respiratory: Negative for shortness of breath.   Cardiovascular: Negative for leg swelling.  Gastrointestinal: Negative for constipation and diarrhea.  Endocrine: Negative for cold intolerance and heat intolerance.  Genitourinary: Negative for difficulty urinating.  Musculoskeletal: Negative for joint swelling.  Skin: Negative for rash.  Allergic/Immunologic: Negative for food allergies.  Neurological: Negative for weakness.  Hematological: Does not bruise/bleed easily.  Psychiatric/Behavioral: Negative for sleep disturbance.     Objective: Vital Signs: Ht 5\' 2"  (1.575 m)   BMI 38.26 kg/m   Physical Exam Constitutional:      Appearance: Normal appearance. She is well-developed.  HENT:     Head: Normocephalic.  Eyes:     Pupils: Pupils are equal, round, and reactive to light.  Pulmonary:     Effort: Pulmonary effort is normal.  Skin:    General: Skin is warm and dry.  Neurological:     Mental Status: She is alert and oriented to person, place, and time.  Psychiatric:        Behavior: Behavior normal.     Ortho Exam exam today reveals range of motion from full extension to about 100 degrees of flexion.  She has pain to palpation medially bilaterally.  Right is worse than left.  She may have a small effusion but  this is difficult because of the adiposity about the knee.  She does have crepitance with range of motion.  Negative straight leg raising.   Specialty Comments:  No specialty comments available.  Imaging: No results found.   PMFS History: Patient Active Problem List   Diagnosis Date Noted  . Preoperative cardiovascular examination 05/13/2018  . Vitamin B12 deficiency 07/14/2017  . DJD (degenerative joint disease) of knee 07/07/2013  . Type 2 diabetes mellitus with microalbuminuria, without long-term current use of insulin ([facility]) 05/26/2012  . Anemia 10/23/2011   . Encounter for medication monitoring 10/23/2011  . Hyponatremia 12/01/2010    Class: Acute  . Chest pain with moderate risk for cardiac etiology 11/26/2010    Class: Acute  . CYSTOCELE WITH INCOMPLETE UTERINE PROLAPSE 03/10/2010  . HYPERGLYCEMIA 02/06/2010  . ESOPHAGEAL STRICTURE 12/11/2009  . Obesity 12/09/2009  . PROBLEMS WITH SWALLOWING AND MASTICATION 12/09/2009  . MICROSCOPIC HEMATURIA 07/29/2009  . ARM PAIN, RIGHT 03/28/2009  . Fatigue 03/28/2009  . CONSTIPATION 09/25/2008  . Sinusitis, chronic 02/23/2008  . Osteoarthrosis involving lower leg 09/28/2007  . LEG EDEMA, BILATERAL 08/31/2007  . FLANK PAIN, LEFT 06/08/2007  . SPRAIN&STRAIN OTH SPEC SITES SHOULDER&UPPER ARM 06/08/2007  . BENIGN POSITIONAL VERTIGO 04/25/2007  . Headache 03/24/2007  . HYPERCHOLESTEROLEMIA 03/03/2007  . Anxiety state 03/03/2007  . HYPERTENSION, BENIGN ESSENTIAL 03/03/2007  . Allergic rhinitis 03/03/2007  . HIP PAIN, LEFT 03/03/2007  . Osteoporosis 03/03/2007   Past Medical History:  Diagnosis Date  . ALLERGIC RHINITIS   . Allergy   . ANXIETY   . ARM PAIN, RIGHT   . BENIGN POSITIONAL VERTIGO   . Carcinoma in situ of colon   . CONSTIPATION   . CYSTOCELE WITH INCOMPLETE UTERINE PROLAPSE   . ESOPHAGEAL STRICTURE   . FATIGUE   . FLANK PAIN, LEFT   . Headache(784.0)   . HIP PAIN, LEFT   . HYPERCHOLESTEROLEMIA   . HYPERGLYCEMIA   . HYPERTENSION, BENIGN ESSENTIAL 03/03/2007  . LEG EDEMA, BILATERAL   . Microscopic hematuria   . OBESITY   . OSTEOARTHRITIS, KNEE   . OSTEOPOROSIS   . Peripheral neuropathy, idiopathic   . Problems with swallowing and mastication   . SINUSITIS, RECURRENT   . SPRAIN&STRAIN OTH SPEC SITES SHOULDER&UPPER ARM     Family History  Problem Relation Age of Onset  . Coronary artery disease Mother 47  . Cancer Father 10  . Heart disease Father   . Colon cancer Other   . Diabetes Other   . Coronary artery disease Sister   . Diabetes type II Sister   . Cancer  Sister   . Coronary artery disease Brother   . Kidney disease Brother   . Coronary artery disease Brother   . Breast cancer Neg Hx     Past Surgical History:  Procedure Laterality Date  . LEFT HEART CATHETERIZATION WITH CORONARY ANGIOGRAM N/A 12/02/2010   Procedure: LEFT HEART CATHETERIZATION WITH CORONARY ANGIOGRAM;  Surgeon: Peter M Martinique, MD;  Location: [facility] CATH LAB;  Service: Cardiovascular;:: NORMAL CORONARY ARTERIES - NORMAL LV FUNCTION & EDP.    Marland Kitchen TRANSTHORACIC ECHOCARDIOGRAM  11/2010   Normal EF 60-65%. No RWMA.  Mild LAD dilation c/w Gr 1 DD.  -- PRETTY NORMAL ECHO   Social History   Occupational History  . Not on file  Tobacco Use  . Smoking status: Never Smoker  . Smokeless tobacco: Never Used  Substance and Sexual Activity  . Alcohol use: No  . Drug use: No  .  Sexual activity: Not Currently

## 2019-05-18 ENCOUNTER — Encounter: Payer: Self-pay | Admitting: Orthopaedic Surgery

## 2019-05-18 ENCOUNTER — Other Ambulatory Visit: Payer: Self-pay

## 2019-05-18 ENCOUNTER — Ambulatory Visit (INDEPENDENT_AMBULATORY_CARE_PROVIDER_SITE_OTHER): Payer: Medicare Other | Admitting: Orthopaedic Surgery

## 2019-05-18 VITALS — Ht 62.0 in | Wt 209.0 lb

## 2019-05-18 DIAGNOSIS — M1712 Unilateral primary osteoarthritis, left knee: Secondary | ICD-10-CM

## 2019-05-18 NOTE — Progress Notes (Signed)
Office Visit Note   Patient: Casey [name]           Date of Birth: 03/23/1935           MRN: [id number] Visit Date: 05/18/2019              Requested by: Rutherford Guys, MD [address] [place],  [place] [address] PCP: Rutherford Guys, MD   Assessment & Plan: Visit Diagnoses:  1. Primary osteoarthritis of left knee     Plan:  #1: Corticosteroid injection was placed into the left knee without any difficulty.  Tolerated procedure well.  Follow back up as needed. #2: Follow back up as needed.  Follow-Up Instructions: Return if symptoms worsen or fail to improve.   Orders:  No orders of the defined types were placed in this encounter.  No orders of the defined types were placed in this encounter.     Procedures: Large Joint Inj: L knee on 05/23/2019 4:12 PM Indications: pain and diagnostic evaluation Details: 25 G 1.5 in needle, anteromedial approach  Arthrogram: No  Medications: 2 mL lidocaine 1 %; 80 mg methylPREDNISolone acetate 40 MG/ML; 2 mL bupivacaine 0.25 % Outcome: tolerated well, no immediate complications Procedure, treatment alternatives, risks and benefits explained, specific risks discussed. Consent was given by the patient. Patient was prepped and draped in the usual sterile fashion.       Clinical Data: No additional findings.   Subjective: Chief Complaint  Patient presents with  . Right Knee - Pain  . Left Knee - Pain   HPI Patient presents today for follow up on her knees. She was here one week ago and had her right knee injected with cortisone. She said that her right knee is doing great and is getting wonderful relief with the cortisone. She comes back today to have her left knee injected.    Review of Systems  Constitutional: Negative for fatigue.  HENT: Negative for ear pain.   Eyes: Negative for pain.  Respiratory: Negative for shortness of breath.   Cardiovascular: Negative for leg swelling.  Gastrointestinal: Negative for  constipation and diarrhea.  Endocrine: Negative for cold intolerance and heat intolerance.  Genitourinary: Negative for difficulty urinating.  Musculoskeletal: Negative for joint swelling.  Skin: Negative for rash.  Allergic/Immunologic: Negative for food allergies.  Neurological: Negative for weakness.  Hematological: Does not bruise/bleed easily.  Psychiatric/Behavioral: Negative for sleep disturbance.     Objective: Vital Signs: Ht 5\' 2"  (1.575 m)   Wt 209 lb (94.8 kg)   BMI 38.23 kg/m   Physical Exam Constitutional:      Appearance: Normal appearance. She is well-developed. She is obese.  HENT:     Head: Normocephalic.  Eyes:     Pupils: Pupils are equal, round, and reactive to light.  Pulmonary:     Effort: Pulmonary effort is normal.  Skin:    General: Skin is warm and dry.  Neurological:     Mental Status: She is alert and oriented to person, place, and time.  Psychiatric:        Behavior: Behavior normal.      Ortho Exam  Exam today reveals range of motion from full extension to 100 degrees of flexion.  Pain mainly in the medial aspect of the knees.  The right knee is improved from injection.  Left knee still has a small effusion but this is difficult to determine secondary to adiposity.  Patellofemoral crepitance with range of motion.  Negative straight  leg raising.  Specialty Comments:  No specialty comments available.  Imaging: No results found.   PMFS History: Current Outpatient Medications  Medication Sig Dispense Refill  . ALPRAZolam (XANAX) 0.25 MG tablet TAKE 1 TABLET(0.25 MG) BY MOUTH AT BEDTIME AS NEEDED FOR ANXIETY 30 tablet 2  . amLODipine (NORVASC) 5 MG tablet TAKE 1 TABLET(5 MG) BY MOUTH DAILY 90 tablet 1  . aspirin EC 81 MG tablet Take 1 tablet (81 mg total) by mouth daily.    Marland Kitchen atorvastatin (LIPITOR) 10 MG tablet Take 1 tablet (10 mg total) by mouth daily. 90 tablet 3  . azelastine (ASTELIN) 0.1 % nasal spray USE 1 SPRAY IN EACH NOSTRIL  TWICE DAILY AS DIRECTED 30 mL 3  . azelastine (OPTIVAR) 0.05 % ophthalmic solution INSTILL 1 DROP IN BOTH EYES TWICE DAILY 6 mL 3  . busPIRone (BUSPAR) 5 MG tablet TAKE 1 TABLET(5 MG) BY MOUTH TWICE DAILY FOR ANXIETY 60 tablet 3  . fluticasone (FLONASE) 50 MCG/ACT nasal spray Place 1 spray into both nostrils 2 (two) times daily. 16 g 6  . gabapentin (NEURONTIN) 600 MG tablet TAKE 1 TABLET(600 MG) BY MOUTH THREE TIMES DAILY 270 tablet 0  . glucose blood test strip 1 each by Other route daily. Use as instructed 50 each 11  . HYDROcodone-acetaminophen (NORCO/VICODIN) 5-325 MG tablet Take 1 tablet by mouth every 6 (six) hours as needed for moderate pain. For chronic pain 120 tablet 0  . lisinopril (ZESTRIL) 40 MG tablet TAKE 1 TABLET(40 MG) BY MOUTH DAILY 90 tablet 1  . meclizine (ANTIVERT) 25 MG tablet Take 1 tablet (25 mg total) by mouth 3 (three) times daily as needed for dizziness. 30 tablet 1  . metFORMIN (GLUCOPHAGE) 500 MG tablet TAKE 1 TABLET(500 MG) BY MOUTH TWICE DAILY WITH A MEAL 180 tablet 1  . omeprazole (PRILOSEC) 40 MG capsule Take 1 capsule (40 mg total) by mouth daily. 30 capsule 5   No current facility-administered medications for this visit.    Patient Active Problem List   Diagnosis Date Noted  . Preoperative cardiovascular examination 05/13/2018  . Vitamin B12 deficiency 07/14/2017  . Primary osteoarthritis of left knee 07/07/2013  . Type 2 diabetes mellitus with microalbuminuria, without long-term current use of insulin (HCC) 05/26/2012  . Anemia 10/23/2011  . Encounter for medication monitoring 10/23/2011  . Hyponatremia 12/01/2010    Class: Acute  . Chest pain with moderate risk for cardiac etiology 11/26/2010    Class: Acute  . CYSTOCELE WITH INCOMPLETE UTERINE PROLAPSE 03/10/2010  . HYPERGLYCEMIA 02/06/2010  . ESOPHAGEAL STRICTURE 12/11/2009  . Obesity 12/09/2009  . PROBLEMS WITH SWALLOWING AND MASTICATION 12/09/2009  . MICROSCOPIC HEMATURIA 07/29/2009  . ARM  PAIN, RIGHT 03/28/2009  . Fatigue 03/28/2009  . CONSTIPATION 09/25/2008  . Sinusitis, chronic 02/23/2008  . Osteoarthrosis involving lower leg 09/28/2007  . LEG EDEMA, BILATERAL 08/31/2007  . FLANK PAIN, LEFT 06/08/2007  . SPRAIN&STRAIN OTH SPEC SITES SHOULDER&UPPER ARM 06/08/2007  . BENIGN POSITIONAL VERTIGO 04/25/2007  . Headache 03/24/2007  . HYPERCHOLESTEROLEMIA 03/03/2007  . Anxiety state 03/03/2007  . HYPERTENSION, BENIGN ESSENTIAL 03/03/2007  . Allergic rhinitis 03/03/2007  . HIP PAIN, LEFT 03/03/2007  . Osteoporosis 03/03/2007   Past Medical History:  Diagnosis Date  . ALLERGIC RHINITIS   . Allergy   . ANXIETY   . ARM PAIN, RIGHT   . BENIGN POSITIONAL VERTIGO   . Carcinoma in situ of colon   . CONSTIPATION   . CYSTOCELE WITH INCOMPLETE UTERINE PROLAPSE   .  ESOPHAGEAL STRICTURE   . FATIGUE   . FLANK PAIN, LEFT   . Headache(784.0)   . HIP PAIN, LEFT   . HYPERCHOLESTEROLEMIA   . HYPERGLYCEMIA   . HYPERTENSION, BENIGN ESSENTIAL 03/03/2007  . LEG EDEMA, BILATERAL   . Microscopic hematuria   . OBESITY   . OSTEOARTHRITIS, KNEE   . OSTEOPOROSIS   . Peripheral neuropathy, idiopathic   . Problems with swallowing and mastication   . SINUSITIS, RECURRENT   . SPRAIN&STRAIN OTH SPEC SITES SHOULDER&UPPER ARM     Family History  Problem Relation Age of Onset  . Coronary artery disease Mother 61  . Cancer Father 37  . Heart disease Father   . Colon cancer Other   . Diabetes Other   . Coronary artery disease Sister   . Diabetes type II Sister   . Cancer Sister   . Coronary artery disease Brother   . Kidney disease Brother   . Coronary artery disease Brother   . Breast cancer Neg Hx     Past Surgical History:  Procedure Laterality Date  . LEFT HEART CATHETERIZATION WITH CORONARY ANGIOGRAM N/A 12/02/2010   Procedure: LEFT HEART CATHETERIZATION WITH CORONARY ANGIOGRAM;  Surgeon: Peter M Swaziland, MD;  Location: [facility] CATH LAB;  Service: Cardiovascular;:: NORMAL CORONARY  ARTERIES - NORMAL LV FUNCTION & EDP.    Marland Kitchen TRANSTHORACIC ECHOCARDIOGRAM  11/2010   Normal EF 60-65%. No RWMA.  Mild LAD dilation c/w Gr 1 DD.  -- PRETTY NORMAL ECHO   Social History   Occupational History  . Not on file  Tobacco Use  . Smoking status: Never Smoker  . Smokeless tobacco: Never Used  Substance and Sexual Activity  . Alcohol use: No  . Drug use: No  . Sexual activity: Not Currently

## 2019-05-23 DIAGNOSIS — M1712 Unilateral primary osteoarthritis, left knee: Secondary | ICD-10-CM

## 2019-05-23 MED ORDER — BUPIVACAINE HCL 0.25 % IJ SOLN
2.0000 mL | INTRAMUSCULAR | Status: AC | PRN
  Administered 2019-05-23: 2 mL via INTRA_ARTICULAR

## 2019-05-23 MED ORDER — LIDOCAINE HCL 1 % IJ SOLN
2.0000 mL | INTRAMUSCULAR | Status: AC | PRN
  Administered 2019-05-23: 2 mL

## 2019-05-23 MED ORDER — METHYLPREDNISOLONE ACETATE 40 MG/ML IJ SUSP
80.0000 mg | INTRAMUSCULAR | Status: AC | PRN
  Administered 2019-05-23: 16:00:00 80 mg via INTRA_ARTICULAR

## 2019-05-24 ENCOUNTER — Other Ambulatory Visit: Payer: Self-pay | Admitting: Family Medicine

## 2019-05-24 DIAGNOSIS — G609 Hereditary and idiopathic neuropathy, unspecified: Secondary | ICD-10-CM

## 2019-06-10 ENCOUNTER — Other Ambulatory Visit: Payer: Self-pay | Admitting: Family Medicine

## 2019-06-10 DIAGNOSIS — I10 Essential (primary) hypertension: Secondary | ICD-10-CM

## 2019-06-16 ENCOUNTER — Other Ambulatory Visit: Payer: Self-pay | Admitting: Family Medicine

## 2019-06-16 DIAGNOSIS — H8110 Benign paroxysmal vertigo, unspecified ear: Secondary | ICD-10-CM

## 2019-06-16 NOTE — Telephone Encounter (Signed)
Requested medication (s) are due for refill today: yes  Requested medication (s) are on the active medication list: yes  Last refill:  02/16/19  Future visit scheduled: yes  Notes to clinic:  not delegated    Requested Prescriptions  Pending Prescriptions Disp Refills   meclizine (ANTIVERT) 25 MG tablet [Pharmacy Med Name: MECLIZINE 25MG  RX TABLETS] 270 tablet     Sig: TAKE 1 TABLET(25 MG) BY MOUTH THREE TIMES DAILY      Not Delegated - Gastroenterology: Antiemetics Failed - 06/16/2019 11:13 AM      Failed - This refill cannot be delegated      Passed - Valid encounter within last 6 months    Recent Outpatient Visits           4 months ago Type 2 diabetes mellitus with microalbuminuria, without long-term current use of insulin (HCC)   Primary Care at 06/18/2019, Oneita Jolly, MD   7 months ago HYPERTENSION, BENIGN ESSENTIAL   [facility] at [facility], Oneita Jolly, MD   8 months ago Medicare annual wellness visit, subsequent   Primary Care at [facility], Zoe A, MD   9 months ago GAD (generalized anxiety disorder)   Primary Care at [facility], Oneita Jolly, MD   10 months ago GAD (generalized anxiety disorder)   [facility] at [facility], Oneita Jolly, MD       Future Appointments             In 1 month [facility], MD Primary Care at [place], [facility]

## 2019-07-07 ENCOUNTER — Other Ambulatory Visit: Payer: Self-pay | Admitting: Family Medicine

## 2019-07-07 DIAGNOSIS — M170 Bilateral primary osteoarthritis of knee: Secondary | ICD-10-CM

## 2019-07-07 MED ORDER — HYDROCODONE-ACETAMINOPHEN 5-325 MG PO TABS: 1.0000 | ORAL_TABLET | Freq: Four times a day (QID) | ORAL | 0 refills | Status: DC | PRN

## 2019-07-07 NOTE — Telephone Encounter (Signed)
pmp reviewd, appropriate meds refilled 

## 2019-07-07 NOTE — Telephone Encounter (Signed)
Pt did not call pharm said she was told to call the office. Pt needs a refill on hydrocodone. [facility] [address] in [place]. Pt has an appt  On 07-17-2019 with dr Leretha Pol

## 2019-07-07 NOTE — Telephone Encounter (Signed)
Patient is requesting a refill of the following medications: Requested Prescriptions   Pending Prescriptions Disp Refills   HYDROcodone-acetaminophen (NORCO/VICODIN) 5-325 MG tablet 120 tablet 0    Sig: Take 1 tablet by mouth every 6 (six) hours as needed for moderate pain. For chronic pain    Date of patient request: 07/07/19 Last office visit: 01/23/19 Date of last refill: 05/04/19 Last refill amount: 120 Follow up time period per chart:07/17/19

## 2019-07-17 ENCOUNTER — Encounter: Payer: Self-pay | Admitting: Family Medicine

## 2019-07-17 ENCOUNTER — Other Ambulatory Visit: Payer: Self-pay

## 2019-07-17 ENCOUNTER — Ambulatory Visit (INDEPENDENT_AMBULATORY_CARE_PROVIDER_SITE_OTHER): Payer: Medicare Other | Admitting: Family Medicine

## 2019-07-17 VITALS — BP 131/79 | HR 81 | Temp 97.5°F | Ht 62.0 in | Wt 208.8 lb

## 2019-07-17 DIAGNOSIS — E538 Deficiency of other specified B group vitamins: Secondary | ICD-10-CM

## 2019-07-17 DIAGNOSIS — R809 Proteinuria, unspecified: Secondary | ICD-10-CM | POA: Diagnosis not present

## 2019-07-17 DIAGNOSIS — E7800 Pure hypercholesterolemia, unspecified: Secondary | ICD-10-CM | POA: Diagnosis not present

## 2019-07-17 DIAGNOSIS — E1129 Type 2 diabetes mellitus with other diabetic kidney complication: Secondary | ICD-10-CM | POA: Diagnosis not present

## 2019-07-17 DIAGNOSIS — I10 Essential (primary) hypertension: Secondary | ICD-10-CM | POA: Diagnosis not present

## 2019-07-17 DIAGNOSIS — F411 Generalized anxiety disorder: Secondary | ICD-10-CM

## 2019-07-17 DIAGNOSIS — K219 Gastro-esophageal reflux disease without esophagitis: Secondary | ICD-10-CM

## 2019-07-17 DIAGNOSIS — K279 Peptic ulcer, site unspecified, unspecified as acute or chronic, without hemorrhage or perforation: Secondary | ICD-10-CM | POA: Insufficient documentation

## 2019-07-17 DIAGNOSIS — M1712 Unilateral primary osteoarthritis, left knee: Secondary | ICD-10-CM

## 2019-07-17 LAB — POCT GLYCOSYLATED HEMOGLOBIN (HGB A1C): Hemoglobin A1C: 7.3 % — AB (ref 4.0–5.6)

## 2019-07-17 MED ORDER — OMEPRAZOLE 40 MG PO CPDR: 40.0000 mg | DELAYED_RELEASE_CAPSULE | Freq: Every day | ORAL | 5 refills | Status: DC

## 2019-07-17 MED ORDER — AMLODIPINE BESYLATE 5 MG PO TABS: ORAL_TABLET | ORAL | 1 refills | Status: DC

## 2019-07-17 MED ORDER — HYDROCHLOROTHIAZIDE 12.5 MG PO TABS
12.5000 mg | ORAL_TABLET | Freq: Every day | ORAL | 1 refills | Status: DC
Start: 2019-07-17 — End: 2019-10-16

## 2019-07-17 MED ORDER — LISINOPRIL 40 MG PO TABS: 40.0000 mg | ORAL_TABLET | Freq: Every day | ORAL | 1 refills | Status: DC

## 2019-07-17 MED ORDER — BUSPIRONE HCL 5 MG PO TABS: ORAL_TABLET | ORAL | 3 refills | Status: DC

## 2019-07-17 NOTE — Patient Instructions (Signed)
° ° ° °  If you have lab work done today you will be contacted with your lab results within the next 2 weeks.  If you have not heard from us then please contact us. The fastest way to get your results is to register for My Chart. ° ° °IF you received an x-ray today, you will receive an invoice from [facility]. Please contact [facility] at [phone] with questions or concerns regarding your invoice.  ° °IF you received labwork today, you will receive an invoice from [facility]. Please contact [facility] at [phone] with questions or concerns regarding your invoice.  ° °Our billing staff will not be able to assist you with questions regarding bills from these companies. ° °You will be contacted with the lab results as soon as they are available. The fastest way to get your results is to activate your My Chart account. Instructions are located on the last page of this paperwork. If you have not heard from us regarding the results in 2 weeks, please contact this office. °  ° ° ° °

## 2019-07-17 NOTE — Progress Notes (Signed)
6/28/20219:26 AM  Casey [name] 1935/05/12, 84 y.o., female [id number]  Chief Complaint  Patient presents with  . Diabetes  . Hypertension    having some leg swelling in both legs    HPI:   Patient is a 84 y.o. female with past medical history significant for GAD, DM2 with peripheral neuropathy and microabuminuria, HTN, HLP, OA of knee, vitamin b12 deficiency, osteoporosis, seasonal allergies,who presents today for followup  Last OV jan 2021 - started statin  Overall doing well She does not check cbgs She avoids starchy foods and sweets She denies any lows Takes metformin 58m BID  Takes amlodipine and lisinopril for her BP daily, she does not check BP at home  Anxiety stable She takes buspar BID and alprazolam 0.259mat bedtime long standing h/o of bzd use and intolerant of further wean Failed multiple SSRI  Had steroid knee injection to left knee for OA She does not report much benefit at all Thinking she will undergo left TKA in sept 2021  She takes omeprazole daily, gerd well controlled  [facility] [place]  Component Value Date   HGBA1C 6.7 (H) 10/20/2018   HGBA1C 6.6 (H) 04/04/2018   HGBA1C 6.6 (A) 01/03/2018   Lab Results  Component Value Date   MICROALBUR 0.7 10/11/2014   LDLCALC 134 (H) 10/20/2018   CREATININE 0.79 01/23/2019   Lab Results  Component Value Date   VITAMINB12 360 10/20/2018    Depression screen PHQ 2/9 01/23/2019 01/23/2019 10/20/2018  Decreased Interest 0 0 0  Down, Depressed, Hopeless 1 0 0  PHQ - 2 Score 1 0 0  Altered sleeping 2 - -  Tired, decreased energy 1 - -  Change in appetite 0 - -  Feeling bad or failure about yourself  0 - -  Trouble concentrating 1 - -  Moving slowly or fidgety/restless 0 - -  Suicidal thoughts 0 - -  PHQ-9 Score 5 - -  Difficult doing work/chores - - -  Some recent data might be hidden    Fall Risk   07/17/2019 01/23/2019 10/20/2018 10/12/2018 09/08/2018  Falls in the past year? 0 0 0 0 0  Number falls in past yr: 0 0 0 0 0  Injury with Fall? 0 0 0 0 0  Follow up - Falls evaluation completed - Falls evaluation completed;Education provided;Falls prevention discussed -     No Known Allergies  Prior to Admission medications   Medication Sig Start Date End Date Taking? Authorizing Provider  ALPRAZolam (XANAX) 0.25 MG tablet TAKE 1 TABLET(0.25 MG) BY MOUTH AT BEDTIME AS NEEDED FOR ANXIETY 04/24/19  Yes SaRutherford GuysMD  amLODipine (NORVASC) 5 MG tablet TAKE 1 TABLET(5 MG) BY MOUTH DAILY 12/29/18  Yes SaRutherford GuysMD  aspirin EC 81 MG tablet Take 1 tablet (81 mg total) by mouth daily. 08/21/15  Yes ShShawnee KnappMD  atorvastatin (LIPITOR) 10 MG tablet Take 1 tablet (10 mg total) by mouth daily. 01/23/19  Yes SaRutherford GuysMD  azelastine (ASTELIN) 0.1 % nasal spray USE 1 SPRAY IN EACH NOSTRIL TWICE DAILY AS DIRECTED 07/14/18  Yes SaRutherford GuysMD  azelastine (OPTIVAR) 0.05 % ophthalmic solution INSTILL 1 DROP IN BOTH EYES TWICE DAILY 04/11/19  Yes SaRutherford GuysMD  busPIRone (BUSPAR) 5 MG tablet TAKE 1 TABLET(5 MG) BY MOUTH TWICE DAILY FOR ANXIETY 03/20/19  Yes Rutherford Guys, MD  fluticasone [facility]) 50 MCG/ACT nasal spray Place 1 spray into both nostrils 2 (two) times daily. 09/08/18  Yes Rutherford Guys, MD  gabapentin (NEURONTIN) 600 MG tablet TAKE 1 TABLET(600 MG) BY MOUTH THREE TIMES DAILY 05/24/19  Yes Rutherford Guys, MD  glucose blood test strip 1 each by Other route daily. Use as instructed 09/08/16  Yes [name], MD  HYDROcodone-acetaminophen (NORCO/VICODIN) 5-325 MG tablet Take 1 tablet by mouth every 6 (six) hours as needed for moderate pain. For chronic pain 07/07/19  Yes Rutherford Guys, MD  lisinopril (ZESTRIL) 40 MG tablet TAKE 1 TABLET(40 MG) BY MOUTH DAILY 06/10/19  Yes Rutherford Guys, MD  meclizine (ANTIVERT) 25 MG tablet TAKE 1 TABLET(25 MG) BY MOUTH THREE TIMES  DAILY 06/16/19  Yes Rutherford Guys, MD  metFORMIN (GLUCOPHAGE) 500 MG tablet TAKE 1 TABLET(500 MG) BY MOUTH TWICE DAILY WITH A MEAL 05/11/19  Yes Rutherford Guys, MD  omeprazole (PRILOSEC) 40 MG capsule Take 1 capsule (40 mg total) by mouth daily. 10/20/18  Yes Rutherford Guys, MD  vitamin B-12 (CYANOCOBALAMIN) 500 MCG tablet Take by mouth.   Yes [provider]    Past Medical History:  Diagnosis Date  . ALLERGIC RHINITIS   . Allergy   . ANXIETY   . ARM PAIN, RIGHT   . BENIGN POSITIONAL VERTIGO   . Carcinoma in situ of colon   . CONSTIPATION   . CYSTOCELE WITH INCOMPLETE UTERINE PROLAPSE   . ESOPHAGEAL STRICTURE   . FATIGUE   . FLANK PAIN, LEFT   . Headache(784.0)   . HIP PAIN, LEFT   . HYPERCHOLESTEROLEMIA   . HYPERGLYCEMIA   . HYPERTENSION, BENIGN ESSENTIAL 03/03/2007  . LEG EDEMA, BILATERAL   . Microscopic hematuria   . OBESITY   . OSTEOARTHRITIS, KNEE   . OSTEOPOROSIS   . Peripheral neuropathy, idiopathic   . Problems with swallowing and mastication   . SINUSITIS, RECURRENT   . SPRAIN&STRAIN OTH SPEC SITES SHOULDER&UPPER ARM     Past Surgical History:  Procedure Laterality Date  . LEFT HEART CATHETERIZATION WITH CORONARY ANGIOGRAM N/A 12/02/2010   Procedure: LEFT HEART CATHETERIZATION WITH CORONARY ANGIOGRAM;  Surgeon: Peter M Martinique, MD;  Location: [facility] CATH LAB;  Service: Cardiovascular;:: NORMAL CORONARY ARTERIES - NORMAL LV FUNCTION & EDP.    Marland Kitchen TRANSTHORACIC ECHOCARDIOGRAM  11/2010   Normal EF 60-65%. No RWMA.  Mild LAD dilation c/w Gr 1 DD.  -- PRETTY NORMAL ECHO    Social History   Tobacco Use  . Smoking status: Never Smoker  . Smokeless tobacco: Never Used  Substance Use Topics  . Alcohol use: No    Family History  Problem Relation Age of Onset  . Coronary artery disease Mother 5  . Cancer Father 46  . Heart disease Father   . Colon cancer Other   . Diabetes Other   . Coronary artery disease Sister   . Diabetes type II Sister   . Cancer  Sister   . Coronary artery disease Brother   . Kidney disease Brother   . Coronary artery disease Brother   . Breast cancer Neg Hx     Review of Systems  Constitutional: Negative for chills and fever.  Respiratory: Negative for cough and shortness of breath.   Cardiovascular: Positive for leg swelling. Negative for chest pain and palpitations.  Gastrointestinal: Negative for abdominal pain, nausea and vomiting.     OBJECTIVE:  Today's  Vitals   07/17/19 0859  BP: 131/79  Pulse: 81  Temp: (!) 97.5 F (36.4 C)  SpO2: 97%  Weight: 208 lb 12.8 oz (94.7 kg)  Height: _0  (1.575 m)   Body mass index is 38.19 kg/m.   Physical Exam Vitals and nursing note reviewed.  Constitutional:      Appearance: She is well-developed.  HENT:     Head: Normocephalic and atraumatic.     Mouth/Throat:     Pharynx: No oropharyngeal exudate.  Eyes:     General: No scleral icterus.    Conjunctiva/sclera: Conjunctivae normal.     Pupils: Pupils are equal, round, and reactive to light.  Cardiovascular:     Rate and Rhythm: Normal rate and regular rhythm.     Heart sounds: Normal heart sounds. No murmur heard.  No friction rub. No gallop.   Pulmonary:     Effort: Pulmonary effort is normal.     Breath sounds: Normal breath sounds. No wheezing or rales.  Musculoskeletal:     Cervical back: Neck supple.     Right lower leg: Edema (+1 pitting edema) present.     Left lower leg: Edema present.  Skin:    General: Skin is warm and dry.  Neurological:     Mental Status: She is alert and oriented to person, place, and time.     Results for orders placed or performed in visit on 07/17/19 (from the past 24 hour(s))  POCT glycosylated hemoglobin (Hb A1C)     Status: Abnormal   Collection Time: 07/17/19  9:51 AM  Result Value Ref Range   Hemoglobin A1C 7.3 (A) 4.0 - 5.6 %   HbA1c POC (<> result, manual entry)     HbA1c, POC (prediabetic range)     HbA1c, POC (controlled diabetic range)       No results found.   ASSESSMENT and PLAN  1. Type 2 diabetes mellitus with microalbuminuria, without long-term current use of insulin (HCC) Controlled. Continue current regime. a1c < 8.0 acceptable given age. On max dose of ACE. - Lipid panel - TSH - CMP14+EGFR - POCT glycosylated hemoglobin (Hb A1C) - Microalbumin/Creatinine Ratio, Urine  2. Pure hypercholesterolemia Checking labs today, medications will be adjusted as needed.  - Lipid panel - TSH - CMP14+EGFR  3. Essential hypertension, benign Controlled. Continue current regime. Adding low dose HCTZ given edema. Discussed low salt diet and elevation. Recheck BMP in 1 week. - Lipid panel - TSH - IPJ82+NKNL - Basic Metabolic Panel; Future - amLODipine (NORVASC) 5 MG tablet; TAKE 1 TABLET(5 MG) BY MOUTH DAILY - lisinopril (ZESTRIL) 40 MG tablet; Take 1 tablet (40 mg total) by mouth daily.  4. Vitamin B12 deficiency Checking labs today, medications will be adjusted as needed.  - Vitamin B12  5. Gastroesophageal reflux disease without esophagitis Controlled. Continue current regime.  - omeprazole (PRILOSEC) 40 MG capsule; Take 1 capsule (40 mg total) by mouth daily.  6. Primary osteoarthritis of left knee Managed by ortho, possible TKA later this year  7. GAD (generalized anxiety disorder) Stable. Continue current regime. pmp reviewed  Other orders - vitamin B-12 (CYANOCOBALAMIN) 500 MCG tablet; Take by mouth. - hydrochlorothiazide (HYDRODIURIL) 12.5 MG tablet; Take 1 tablet (12.5 mg total) by mouth daily. - busPIRone (BUSPAR) 5 MG tablet; TAKE 1 TABLET(5 MG) BY MOUTH TWICE DAILY FOR ANXIETY  Return in about 3 months (around 10/17/2019).    Rutherford Guys, MD Primary Care at [facility] [place], [place] [address]  Ph.  [phone] Fax [phone]

## 2019-07-18 LAB — CMP14+EGFR
ALT: 12 IU/L (ref 0–32)
AST: 16 IU/L (ref 0–40)
Albumin/Globulin Ratio: 1.4 (ref 1.2–2.2)
Albumin: 4 g/dL (ref 3.6–4.6)
Alkaline Phosphatase: 96 IU/L (ref 48–121)
BUN/Creatinine Ratio: 16 (ref 12–28)
BUN: 15 mg/dL (ref 8–27)
Bilirubin Total: 0.3 mg/dL (ref 0.0–1.2)
CO2: 21 mmol/L (ref 20–29)
Calcium: 9.8 mg/dL (ref 8.7–10.3)
Chloride: 100 mmol/L (ref 96–106)
Creatinine, Ser: 0.93 mg/dL (ref 0.57–1.00)
GFR calc Af Amer: 66 mL/min/{1.73_m2} (ref >59)
GFR calc non Af Amer: 57 mL/min/{1.73_m2} — ABNORMAL LOW (ref >59)
Globulin, Total: 2.9 g/dL (ref 1.5–4.5)
Glucose: 120 mg/dL — ABNORMAL HIGH (ref 65–99)
Potassium: 4.4 mmol/L (ref 3.5–5.2)
Sodium: 138 mmol/L (ref 134–144)
Total Protein: 6.9 g/dL (ref 6.0–8.5)

## 2019-07-18 LAB — LIPID PANEL
Chol/HDL Ratio: 2 ratio (ref 0.0–4.4)
Cholesterol, Total: 175 mg/dL (ref 100–199)
HDL: 87 mg/dL (ref >39)
LDL Chol Calc (NIH): 78 mg/dL (ref 0–99)
Triglycerides: 47 mg/dL (ref 0–149)
VLDL Cholesterol Cal: 10 mg/dL (ref 5–40)

## 2019-07-18 LAB — MICROALBUMIN / CREATININE URINE RATIO
Creatinine, Urine: 116.7 mg/dL
Microalb/Creat Ratio: 29 mg/g creat (ref 0–29)
Microalbumin, Urine: 34.2 ug/mL

## 2019-07-18 LAB — TSH: TSH: 1.3 u[IU]/mL (ref 0.450–4.500)

## 2019-07-18 LAB — VITAMIN B12: Vitamin B-12: >2000 pg/mL — ABNORMAL HIGH (ref 232–1245)

## 2019-07-21 ENCOUNTER — Ambulatory Visit: Payer: Medicare Other | Admitting: Podiatry

## 2019-07-27 ENCOUNTER — Other Ambulatory Visit: Payer: Self-pay | Admitting: Family Medicine

## 2019-07-27 DIAGNOSIS — F411 Generalized anxiety disorder: Secondary | ICD-10-CM

## 2019-07-27 NOTE — Telephone Encounter (Signed)
Patient is requesting a refill of the following medications: Requested Prescriptions   Pending Prescriptions Disp Refills   ALPRAZolam (XANAX) 0.25 MG tablet [Pharmacy Med Name: ALPRAZOLAM 0.25MG  TABLETS] 30 tablet     Sig: TAKE 1 TABLET(0.25 MG) BY MOUTH AT BEDTIME AS NEEDED FOR ANXIETY    Date of patient request: 07/27/19 Last office visit: 07/17/19 Date of last refill: 04/24/19 Last refill amount:30 2rf Follow up time period per chart: 10/16/2019

## 2019-07-27 NOTE — Telephone Encounter (Signed)
Requested medication (s) are due for refill today - yes  Requested medication (s) are on the active medication list -yes  Future visit scheduled -yes  Last refill: 06/29/19  Notes to clinic: Request for non delegated Rx  Requested Prescriptions  Pending Prescriptions Disp Refills   ALPRAZolam (XANAX) 0.25 MG tablet [Pharmacy Med Name: ALPRAZOLAM 0.25MG  TABLETS] 30 tablet     Sig: TAKE 1 TABLET(0.25 MG) BY MOUTH AT BEDTIME AS NEEDED FOR ANXIETY      Not Delegated - Psychiatry:  Anxiolytics/Hypnotics Failed - 07/27/2019 12:24 PM      Failed - This refill cannot be delegated      Failed - Urine Drug Screen completed in last 360 days.      Passed - Valid encounter within last 6 months    Recent Outpatient Visits           1 week ago Type 2 diabetes mellitus with microalbuminuria, without long-term current use of insulin (HCC)   Primary Care at [facility], Meda Coffee, MD   6 months ago Type 2 diabetes mellitus with microalbuminuria, without long-term current use of insulin [facility])   [facility] at [facility], Meda Coffee, MD   9 months ago HYPERTENSION, BENIGN ESSENTIAL   Primary Care at [facility], Meda Coffee, MD   9 months ago Medicare annual wellness visit, subsequent   Primary Care at [facility] [place] A, MD   10 months ago GAD (generalized anxiety disorder)   Primary Care at [facility], Meda Coffee, MD       Future Appointments             In 2 months Myles Lipps, MD Primary Care at [place], [facility]                Requested Prescriptions  Pending Prescriptions Disp Refills   ALPRAZolam (XANAX) 0.25 MG tablet [Pharmacy Med Name: ALPRAZOLAM 0.25MG  TABLETS] 30 tablet     Sig: TAKE 1 TABLET(0.25 MG) BY MOUTH AT BEDTIME AS NEEDED FOR ANXIETY      Not Delegated - Psychiatry:  Anxiolytics/Hypnotics Failed - 07/27/2019 12:24 PM      Failed - This refill cannot be delegated      Failed - Urine Drug Screen completed in last 360 days.      Passed - Valid encounter  within last 6 months    Recent Outpatient Visits           1 week ago Type 2 diabetes mellitus with microalbuminuria, without long-term current use of insulin [facility])   Primary Care at [facility], Meda Coffee, MD   6 months ago Type 2 diabetes mellitus with microalbuminuria, without long-term current use of insulin [facility])   [facility] at [facility], Meda Coffee, MD   9 months ago HYPERTENSION, BENIGN ESSENTIAL   Primary Care at [facility], Meda Coffee, MD   9 months ago Medicare annual wellness visit, subsequent   Primary Care at [facility], [place] A, MD   10 months ago GAD (generalized anxiety disorder)   Primary Care at [facility], Meda Coffee, MD       Future Appointments             In 2 months Myles Lipps, MD Primary Care at [place], [facility]

## 2019-07-27 NOTE — Telephone Encounter (Signed)
pmp reviewd, appropriate meds refilled 

## 2019-08-02 ENCOUNTER — Encounter: Payer: Self-pay | Admitting: Radiology

## 2019-08-08 ENCOUNTER — Telehealth: Payer: Self-pay | Admitting: Family Medicine

## 2019-08-08 NOTE — Telephone Encounter (Signed)
Pt called for her Lab Result some one call her last month she was out of [place] she is back please Advice

## 2019-08-09 ENCOUNTER — Other Ambulatory Visit: Payer: Self-pay | Admitting: Family Medicine

## 2019-08-09 NOTE — Telephone Encounter (Signed)
Requested Prescriptions  Pending Prescriptions Disp Refills   azelastine (OPTIVAR) 0.05 % ophthalmic solution [Pharmacy Med Name: AZELASTINE 0.05% OPHTH SOLUTION 6ML] 6 mL 3    Sig: INSTILL 1 DROP IN BOTH EYES TWICE DAILY     Ophthalmology:  Antiallergy Passed - 08/09/2019  6:36 AM      Passed - Valid encounter within last 12 months    Recent Outpatient Visits          3 weeks ago Type 2 diabetes mellitus with microalbuminuria, without long-term current use of insulin (HCC)   Primary Care at [facility], Meda Coffee, MD   6 months ago Type 2 diabetes mellitus with microalbuminuria, without long-term current use of insulin [facility])   [facility] at [facility], Meda Coffee, MD   9 months ago HYPERTENSION, BENIGN ESSENTIAL   [facility] at [facility], Meda Coffee, MD   10 months ago Medicare annual wellness visit, subsequent   Primary Care at [facility], Zoe A, MD   11 months ago GAD (generalized anxiety disorder)   Primary Care at [facility], Meda Coffee, MD      Future Appointments            In 2 months Myles Lipps, MD Primary Care at [place], [facility]

## 2019-08-09 NOTE — Telephone Encounter (Signed)
Attempted to call pt back and relay the message about lab results. No answer so I have left a message to call back and stated that there was a letter sent to her home with the results as well.

## 2019-08-31 ENCOUNTER — Other Ambulatory Visit: Payer: Self-pay | Admitting: Family Medicine

## 2019-08-31 DIAGNOSIS — F411 Generalized anxiety disorder: Secondary | ICD-10-CM

## 2019-08-31 NOTE — Telephone Encounter (Signed)
Patient is requesting a refill of the following medications: Requested Prescriptions   Pending Prescriptions Disp Refills   ALPRAZolam (XANAX) 0.25 MG tablet [Pharmacy Med Name: ALPRAZOLAM 0.25MG  TABLETS] 30 tablet     Sig: TAKE 1 TABLET(0.25 MG) BY MOUTH AT BEDTIME AS NEEDED FOR ANXIETY    Date of patient request: 08/31/19 Last office visit: 07/17/19 Date of last refill: 07/27/19 Last refill amount: 30-0rf Follow up time period per chart: 10/16/19

## 2019-08-31 NOTE — Telephone Encounter (Signed)
Requested medication (s) are due for refill today: yes  Requested medication (s) are on the active medication list: yes  Last refill:  07/27/2019  Future visit scheduled: yes  Notes to clinic:  this refill cannot be delegated    Requested Prescriptions  Pending Prescriptions Disp Refills   ALPRAZolam (XANAX) 0.25 MG tablet [Pharmacy Med Name: ALPRAZOLAM 0.25MG  TABLETS] 30 tablet     Sig: TAKE 1 TABLET(0.25 MG) BY MOUTH AT BEDTIME AS NEEDED FOR ANXIETY      Not Delegated - Psychiatry:  Anxiolytics/Hypnotics Failed - 08/31/2019  1:38 PM      Failed - This refill cannot be delegated      Failed - Urine Drug Screen completed in last 360 days.      Passed - Valid encounter within last 6 months    Recent Outpatient Visits           1 month ago Type 2 diabetes mellitus with microalbuminuria, without long-term current use of insulin [facility])   Primary Care at [facility], Meda Coffee, MD   7 months ago Type 2 diabetes mellitus with microalbuminuria, without long-term current use of insulin [facility])   [facility] at [facility], Meda Coffee, MD   10 months ago HYPERTENSION, BENIGN ESSENTIAL   [facility] at [facility], Meda Coffee, MD   10 months ago Medicare annual wellness visit, subsequent   Primary Care at [facility], Manus Rudd, MD   11 months ago GAD (generalized anxiety disorder)   Primary Care at [facility], Meda Coffee, MD       Future Appointments             In 1 month Myles Lipps, MD Primary Care at [place], [facility]

## 2019-09-01 NOTE — Telephone Encounter (Signed)
pmp reviewed  Med refilled 

## 2019-09-04 ENCOUNTER — Other Ambulatory Visit: Payer: Self-pay | Admitting: Family Medicine

## 2019-09-04 DIAGNOSIS — G609 Hereditary and idiopathic neuropathy, unspecified: Secondary | ICD-10-CM

## 2019-09-04 DIAGNOSIS — M170 Bilateral primary osteoarthritis of knee: Secondary | ICD-10-CM

## 2019-09-04 NOTE — Telephone Encounter (Signed)
Requested Prescriptions  Pending Prescriptions Disp Refills  . gabapentin (NEURONTIN) 600 MG tablet [Pharmacy Med Name: GABAPENTIN 600MG  TABLETS] 270 tablet 2    Sig: TAKE 1 TABLET(600 MG) BY MOUTH THREE TIMES DAILY     Neurology: Anticonvulsants - gabapentin Passed - 09/04/2019  9:53 AM      Passed - Valid encounter within last 12 months    Recent Outpatient Visits          1 month ago Type 2 diabetes mellitus with microalbuminuria, without long-term current use of insulin (HCC)   Primary Care at [facility], [facility], MD   7 months ago Type 2 diabetes mellitus with microalbuminuria, without long-term current use of insulin [facility])   Primary Care at [facility], [facility], MD   10 months ago HYPERTENSION, BENIGN ESSENTIAL   Primary Care at [facility], [facility], MD   10 months ago Medicare annual wellness visit, subsequent   Primary Care at [facility], [facility], MD   12 months ago GAD (generalized anxiety disorder)   Primary Care at [facility], [facility], MD      Future Appointments            In 2 weeks Meda Coffee, MD [facility] [place]   In 1 month [place], MD Primary Care at [address], [facility]

## 2019-09-04 NOTE — Telephone Encounter (Signed)
Requested medication (s) are due for refill today: yes  Requested medication (s) are on the active medication list: yes  Last refill:  07/07/2019  Future visit scheduled:  yes  Notes to clinic:  this refill cannot be delegated    Requested Prescriptions  Pending Prescriptions Disp Refills   HYDROcodone-acetaminophen (NORCO/VICODIN) 5-325 MG tablet 120 tablet 0    Sig: Take 1 tablet by mouth every 6 (six) hours as needed for moderate pain. For chronic pain      Not Delegated - Analgesics:  Opioid Agonist Combinations Failed - 09/04/2019  9:53 AM      Failed - This refill cannot be delegated      Failed - Urine Drug Screen completed in last 360 days.      Passed - Valid encounter within last 6 months    Recent Outpatient Visits           1 month ago Type 2 diabetes mellitus with microalbuminuria, without long-term current use of insulin [facility])   Primary Care at [facility], Meda Coffee, MD   7 months ago Type 2 diabetes mellitus with microalbuminuria, without long-term current use of insulin [facility])   [facility] Care at [facility], Meda Coffee, MD   10 months ago HYPERTENSION, BENIGN ESSENTIAL   [facility] Care at [facility], Meda Coffee, MD   10 months ago Medicare annual wellness visit, subsequent   Primary Care at [facility], Manus Rudd, MD   12 months ago GAD (generalized anxiety disorder)   Primary Care at [facility], Meda Coffee, MD       Future Appointments             In 2 weeks Valeria Batman, MD [facility] [place]   In 1 month Myles Lipps, MD Primary Care at [place], [facility]

## 2019-09-04 NOTE — Telephone Encounter (Signed)
Medication Refill - Medication: Hydrocodone  Has the patient contacted their pharmacy? No. Patient states PCP has to send in request (Agent: If no, request that the patient contact the pharmacy for the refill.) (Agent: If yes, when and what did the pharmacy advise?)  Preferred Pharmacy (with phone number or street name): [facility]  Agent: Please be advised that RX refills may take up to 3 business days. We ask that you follow-up with your pharmacy.

## 2019-09-04 NOTE — Telephone Encounter (Signed)
Patient is requesting a refill of the following medications: Requested Prescriptions   Pending Prescriptions Disp Refills   HYDROcodone-acetaminophen (NORCO/VICODIN) 5-325 MG tablet 120 tablet 0    Sig: Take 1 tablet by mouth every 6 (six) hours as needed for moderate pain. For chronic pain    Date of patient request: 09/04/19 Last office visit: 07/17/19 Date of last refill: 07/07/19 Last refill amount: 120 Follow up time period per chart:

## 2019-09-05 MED ORDER — HYDROCODONE-ACETAMINOPHEN 5-325 MG PO TABS: 1.0000 | ORAL_TABLET | Freq: Four times a day (QID) | ORAL | 0 refills | Status: DC | PRN

## 2019-09-05 NOTE — Telephone Encounter (Signed)
pmp reviewd, appropriate meds refilled 

## 2019-09-20 ENCOUNTER — Encounter: Payer: Self-pay | Admitting: Orthopaedic Surgery

## 2019-09-20 ENCOUNTER — Other Ambulatory Visit: Payer: Self-pay

## 2019-09-20 ENCOUNTER — Ambulatory Visit (INDEPENDENT_AMBULATORY_CARE_PROVIDER_SITE_OTHER): Payer: Medicare Other | Admitting: Orthopaedic Surgery

## 2019-09-20 VITALS — Ht 62.0 in | Wt 190.0 lb

## 2019-09-20 DIAGNOSIS — M1710 Unilateral primary osteoarthritis, unspecified knee: Secondary | ICD-10-CM

## 2019-09-20 DIAGNOSIS — M170 Bilateral primary osteoarthritis of knee: Secondary | ICD-10-CM

## 2019-09-20 DIAGNOSIS — IMO0002 Reserved for concepts with insufficient information to code with codable children: Secondary | ICD-10-CM

## 2019-09-20 NOTE — Progress Notes (Signed)
Office Visit Note   Patient: Casey [name]           Date of Birth: 10-23-1935           MRN: [id number] Visit Date: 09/20/2019              Requested by: Myles Lipps, MD [address] [place],  [place] [address] PCP: Myles Lipps, MD   Assessment & Plan: Visit Diagnoses:  1. Primary osteoarthritis of both knees   2. Osteoarthrosis involving lower leg     Plan: Mrs. Brunette has end-stage osteoarthritis of both knees.  She has had cortisone in the past without much relief.  She is at the point where she wishes to consider knee replacement.  She does live by her self and does not have anyone to help.  We have had a long discussion regarding the surgery and what she can expect.  At this point because of Covid we cannot admit anybody postoperatively so she will have to wait a while before we consider operating.  In the meantime she will check and family members who might be able to help her.  She does have type 2 diabetes and I thought her feet were a little cool and did not feel pulses ongoing order arterial Dopplers and plan to check her back in a month  Follow-Up Instructions: Return in about 1 month (around 10/20/2019).   Orders:  Orders Placed This Encounter  Procedures  . Ambulatory referral to Vascular Surgery   No orders of the defined types were placed in this encounter.     Procedures: No procedures performed   Clinical Data: No additional findings.   Subjective: Chief Complaint  Patient presents with  . Right Knee - Pain  . Left Knee - Pain  Patient presents today for her chronic knee pain. She states that both hurt the same. The pain is all throughout her knees, and the constant. The pain does not alter regardless of activity or weightbearing status. She take hydrocodone for pain every 6 hours. Both lower legs are swollen. They grind, give way, and pop. She has had cortisone injections in the past and states that they do not help. Her last injections were  April of this year.  She would like to discuss knee replacement surgery HPI  Review of Systems   Objective: Vital Signs: Ht 5\' 2"  (1.575 m)   Wt 190 lb (86.2 kg)   BMI 34.75 kg/m   Physical Exam Constitutional:      Appearance: She is well-developed.  Eyes:     Pupils: Pupils are equal, round, and reactive to light.  Pulmonary:     Effort: Pulmonary effort is normal.  Skin:    General: Skin is warm and dry.  Neurological:     Mental Status: She is alert and oriented to person, place, and time.  Psychiatric:        Behavior: Behavior normal.     Ortho Exam awake alert and oriented x3.  Moderately large knees.  Has full extension flexed about 95 degrees bilaterally.  Predominately lateral joint pain on the right medial joint pain on the left.  Not sure she had any effusions.  No popliteal pain or mass.  Does have nonpitting edema both ankles.  She does have some altered sensibility with tingling in both feet presumably on the basis of her diabetic neuropathy.  She is on gabapentin.  I did not feel any pulses.  No instability of either  knee Specialty Comments:  No specialty comments available.  Imaging: No results found.   PMFS History: Patient Active Problem List   Diagnosis Date Noted  . Gastroesophageal reflux disease without esophagitis 07/17/2019  . Preoperative cardiovascular examination 05/13/2018  . Vitamin B12 deficiency 07/14/2017  . Primary osteoarthritis of left knee 07/07/2013  . Type 2 diabetes mellitus with microalbuminuria, without long-term current use of insulin (HCC) 05/26/2012  . Anemia 10/23/2011  . Encounter for medication monitoring 10/23/2011  . Hyponatremia 12/01/2010    Class: Acute  . Chest pain with moderate risk for cardiac etiology 11/26/2010    Class: Acute  . CYSTOCELE WITH INCOMPLETE UTERINE PROLAPSE 03/10/2010  . HYPERGLYCEMIA 02/06/2010  . ESOPHAGEAL STRICTURE 12/11/2009  . Obesity 12/09/2009  . PROBLEMS WITH SWALLOWING AND  MASTICATION 12/09/2009  . MICROSCOPIC HEMATURIA 07/29/2009  . ARM PAIN, RIGHT 03/28/2009  . Fatigue 03/28/2009  . CONSTIPATION 09/25/2008  . Sinusitis, chronic 02/23/2008  . Osteoarthrosis involving lower leg 09/28/2007  . LEG EDEMA, BILATERAL 08/31/2007  . FLANK PAIN, LEFT 06/08/2007  . SPRAIN&STRAIN OTH SPEC SITES SHOULDER&UPPER ARM 06/08/2007  . BENIGN POSITIONAL VERTIGO 04/25/2007  . Headache 03/24/2007  . HYPERCHOLESTEROLEMIA 03/03/2007  . GAD (generalized anxiety disorder) 03/03/2007  . HYPERTENSION, BENIGN ESSENTIAL 03/03/2007  . Allergic rhinitis 03/03/2007  . HIP PAIN, LEFT 03/03/2007  . Osteoporosis 03/03/2007   Past Medical History:  Diagnosis Date  . ALLERGIC RHINITIS   . Allergy   . ANXIETY   . ARM PAIN, RIGHT   . BENIGN POSITIONAL VERTIGO   . Carcinoma in situ of colon   . CONSTIPATION   . CYSTOCELE WITH INCOMPLETE UTERINE PROLAPSE   . ESOPHAGEAL STRICTURE   . FATIGUE   . FLANK PAIN, LEFT   . Headache(784.0)   . HIP PAIN, LEFT   . HYPERCHOLESTEROLEMIA   . HYPERGLYCEMIA   . HYPERTENSION, BENIGN ESSENTIAL 03/03/2007  . LEG EDEMA, BILATERAL   . Microscopic hematuria   . OBESITY   . OSTEOARTHRITIS, KNEE   . OSTEOPOROSIS   . Peripheral neuropathy, idiopathic   . Problems with swallowing and mastication   . SINUSITIS, RECURRENT   . SPRAIN&STRAIN OTH SPEC SITES SHOULDER&UPPER ARM     Family History  Problem Relation Age of Onset  . Coronary artery disease Mother 72  . Cancer Father 65  . Heart disease Father   . Colon cancer Other   . Diabetes Other   . Coronary artery disease Sister   . Diabetes type II Sister   . Cancer Sister   . Coronary artery disease Brother   . Kidney disease Brother   . Coronary artery disease Brother   . Breast cancer Neg Hx     Past Surgical History:  Procedure Laterality Date  . LEFT HEART CATHETERIZATION WITH CORONARY ANGIOGRAM N/A 12/02/2010   Procedure: LEFT HEART CATHETERIZATION WITH CORONARY ANGIOGRAM;  Surgeon:  Zenas Santa M [place], MD;  Location: [facility] CATH LAB;  Service: Cardiovascular;:: NORMAL CORONARY ARTERIES - NORMAL LV FUNCTION & EDP.    Marland Kitchen TRANSTHORACIC ECHOCARDIOGRAM  11/2010   Normal EF 60-65%. No RWMA.  Mild LAD dilation c/w Gr 1 DD.  -- PRETTY NORMAL ECHO   Social History   Occupational History  . Not on file  Tobacco Use  . Smoking status: Never Smoker  . Smokeless tobacco: Never Used  Vaping Use  . Vaping Use: Never used  Substance and Sexual Activity  . Alcohol use: No  . Drug use: No  . Sexual activity: Not Currently

## 2019-09-26 ENCOUNTER — Telehealth: Payer: Self-pay | Admitting: *Deleted

## 2019-09-26 ENCOUNTER — Telehealth: Payer: Self-pay | Admitting: Family Medicine

## 2019-09-26 NOTE — Telephone Encounter (Signed)
Schedule AWV.  

## 2019-09-26 NOTE — Telephone Encounter (Signed)
PT IS CALLING TO SET UP HER AWV

## 2019-10-10 ENCOUNTER — Telehealth: Payer: Self-pay | Admitting: Orthopaedic Surgery

## 2019-10-10 NOTE — Telephone Encounter (Signed)
Pt called wanting to check on the vascular surgery referral that was put in for her; she states she still hasn't heard anything. Pt would like a CB to update her on what's going on.   [phone]

## 2019-10-10 NOTE — Telephone Encounter (Signed)
Sent inbasket message to referral coordinator chanda maness-harrison asking status of referral

## 2019-10-10 NOTE — Telephone Encounter (Signed)
Please see below and advise patient. Thanks!

## 2019-10-16 ENCOUNTER — Other Ambulatory Visit: Payer: Self-pay

## 2019-10-16 ENCOUNTER — Encounter: Payer: Self-pay | Admitting: Family Medicine

## 2019-10-16 ENCOUNTER — Ambulatory Visit (INDEPENDENT_AMBULATORY_CARE_PROVIDER_SITE_OTHER): Payer: Medicare Other | Admitting: Family Medicine

## 2019-10-16 VITALS — BP 146/70 | HR 86 | Temp 97.6°F | Resp 16 | Ht 62.0 in | Wt 205.0 lb

## 2019-10-16 DIAGNOSIS — I10 Essential (primary) hypertension: Secondary | ICD-10-CM | POA: Diagnosis not present

## 2019-10-16 DIAGNOSIS — H8110 Benign paroxysmal vertigo, unspecified ear: Secondary | ICD-10-CM | POA: Diagnosis not present

## 2019-10-16 DIAGNOSIS — M170 Bilateral primary osteoarthritis of knee: Secondary | ICD-10-CM | POA: Diagnosis not present

## 2019-10-16 DIAGNOSIS — F411 Generalized anxiety disorder: Secondary | ICD-10-CM

## 2019-10-16 DIAGNOSIS — Z23 Encounter for immunization: Secondary | ICD-10-CM

## 2019-10-16 MED ORDER — HYDROCODONE-ACETAMINOPHEN 5-325 MG PO TABS: 1.0000 | ORAL_TABLET | Freq: Four times a day (QID) | ORAL | 0 refills | Status: DC | PRN

## 2019-10-16 MED ORDER — MECLIZINE HCL 25 MG PO TABS: ORAL_TABLET | ORAL | 0 refills | Status: DC

## 2019-10-16 MED ORDER — HYDROCHLOROTHIAZIDE 25 MG PO TABS
25.0000 mg | ORAL_TABLET | Freq: Every day | ORAL | 1 refills | Status: DC
Start: 2019-10-16 — End: 2019-11-17

## 2019-10-16 MED ORDER — BUSPIRONE HCL 10 MG PO TABS
10.0000 mg | ORAL_TABLET | Freq: Two times a day (BID) | ORAL | 1 refills | Status: DC
Start: 2019-10-16 — End: 2019-11-17

## 2019-10-16 NOTE — Patient Instructions (Signed)
° ° ° °  If you have lab work done today you will be contacted with your lab results within the next 2 weeks.  If you have not heard from us then please contact us. The fastest way to get your results is to register for My Chart. ° ° °IF you received an x-ray today, you will receive an invoice from [facility]. Please contact [facility] at [phone] with questions or concerns regarding your invoice.  ° °IF you received labwork today, you will receive an invoice from [facility]. Please contact [facility] at [phone] with questions or concerns regarding your invoice.  ° °Our billing staff will not be able to assist you with questions regarding bills from these companies. ° °You will be contacted with the lab results as soon as they are available. The fastest way to get your results is to activate your My Chart account. Instructions are located on the last page of this paperwork. If you have not heard from us regarding the results in 2 weeks, please contact this office. °  ° ° ° °

## 2019-10-16 NOTE — Progress Notes (Signed)
9/27/202111:30 AM  Casey [name] 05-07-1935, 84 y.o., female [id number]  Chief Complaint  Patient presents with  . Diabetes    follow up 3 month and medical conditions  . Hypertension  . Medication Refill    Hydrocodone and Meclizine    HPI:   Patient is a 84 y.o. female with past medical history significant for GAD,DM2 with peripheral neuropathy and microabuminuria, HTN, HLP, OA of knee, vitamin b12 deficiency, osteoporosis, seasonal allergies,who presents today for followup  Last OV June 2021 - added low dose HCTZ for edema Saw ortho earlier this month - ordered arterial dopplers, referred to vasc surg, surgery being postponed currently due to covid  She is overall doing well She did take her BP meds this am Has not noticed sign improvement of edema She does not check BP at home She is having stress and depression Denies SI She never reached out to counseling  She takes buspar BID and alprazolam 0.25mg  at bedtime long standing h/o of bzd use and intolerant of further wean Failed multiple SSRI Needs refill of vicodin with she takes prn for knee OA pain pmp reviewed  Wt Readings from Last 3 Encounters:  10/16/19 205 lb (93 kg)  09/20/19 190 lb (86.2 kg)  07/17/19 208 lb 12.8 oz (94.7 kg)   BP Readings from Last 3 Encounters:  10/16/19 (!) 154/74  07/17/19 131/79  01/23/19 140/77    Lab Results  Component Value Date   HGBA1C 7.3 (A) 07/17/2019   HGBA1C 6.7 (H) 10/20/2018   HGBA1C 6.6 (H) 04/04/2018   Lab Results  Component Value Date   MICROALBUR 0.7 10/11/2014   LDLCALC 78 07/17/2019   CREATININE 0.93 07/17/2019    Depression screen PHQ 2/9 10/16/2019 01/23/2019 01/23/2019  Decreased Interest 3 0 0  Down, Depressed, Hopeless 2 1 0  PHQ - 2 Score 5 1 0  Altered sleeping 3 2 -  Tired, decreased energy 3 1 -  Change in appetite 1 0 -  Feeling bad or failure about yourself  0 0 -  Trouble concentrating 3 1 -  Moving slowly or fidgety/restless 3 0 -    Suicidal thoughts 0 0 -  PHQ-9 Score 18 5 -  Difficult doing work/chores Not difficult at all - -  Some recent data might be hidden    Fall Risk  10/16/2019 07/17/2019 01/23/2019 10/20/2018 10/12/2018  Falls in the past year? 0 0 0 0 0  Number falls in past yr: - 0 0 0 0  Injury with Fall? - 0 0 0 0  Follow up Falls evaluation completed - Falls evaluation completed - Falls evaluation completed;Education provided;Falls prevention discussed     No Known Allergies  Prior to Admission medications   Medication Sig Start Date End Date Taking? Authorizing Provider  ALPRAZolam (XANAX) 0.25 MG tablet TAKE 1 TABLET(0.25 MG) BY MOUTH AT BEDTIME AS NEEDED FOR ANXIETY 09/01/19  Yes Myles Lipps, MD  amLODipine (NORVASC) 5 MG tablet TAKE 1 TABLET(5 MG) BY MOUTH DAILY 07/17/19  Yes Myles Lipps, MD  aspirin EC 81 MG tablet Take 1 tablet (81 mg total) by mouth daily. 08/21/15  Yes Sherren Mocha, MD  atorvastatin (LIPITOR) 10 MG tablet Take 1 tablet (10 mg total) by mouth daily. 01/23/19  Yes Myles Lipps, MD  azelastine (ASTELIN) 0.1 % nasal spray USE 1 SPRAY IN EACH NOSTRIL TWICE DAILY AS DIRECTED 07/14/18  Yes Myles Lipps, MD  azelastine (OPTIVAR) 0.05 % ophthalmic solution  INSTILL 1 DROP IN BOTH EYES TWICE DAILY 08/09/19  Yes Myles Lipps, MD  busPIRone (BUSPAR) 5 MG tablet TAKE 1 TABLET(5 MG) BY MOUTH TWICE DAILY FOR ANXIETY 07/17/19  Yes Myles Lipps, MD  gabapentin (NEURONTIN) 600 MG tablet TAKE 1 TABLET(600 MG) BY MOUTH THREE TIMES DAILY 09/04/19  Yes Myles Lipps, MD  hydrochlorothiazide (HYDRODIURIL) 12.5 MG tablet Take 1 tablet (12.5 mg total) by mouth daily. 07/17/19  Yes Myles Lipps, MD  HYDROcodone-acetaminophen (NORCO/VICODIN) 5-325 MG tablet Take 1 tablet by mouth every 6 (six) hours as needed for moderate pain. For chronic pain 09/05/19  Yes Myles Lipps, MD  lisinopril (ZESTRIL) 40 MG tablet Take 1 tablet (40 mg total) by mouth daily. 07/17/19  Yes Myles Lipps, MD   meclizine (ANTIVERT) 25 MG tablet TAKE 1 TABLET(25 MG) BY MOUTH THREE TIMES DAILY 06/16/19  Yes Myles Lipps, MD  metFORMIN (GLUCOPHAGE) 500 MG tablet TAKE 1 TABLET(500 MG) BY MOUTH TWICE DAILY WITH A MEAL 05/11/19  Yes Myles Lipps, MD  omeprazole (PRILOSEC) 40 MG capsule Take 1 capsule (40 mg total) by mouth daily. 07/17/19  Yes Myles Lipps, MD  vitamin B-12 (CYANOCOBALAMIN) 500 MCG tablet Take by mouth.   Yes [provider]  fluticasone (FLONASE) 50 MCG/ACT nasal spray Place 1 spray into both nostrils 2 (two) times daily. Patient not taking: Reported on 10/16/2019 09/08/18   Myles Lipps, MD  glucose blood test strip 1 each by Other route daily. Use as instructed 09/08/16   Sherren Mocha, MD    Past Medical History:  Diagnosis Date  . ALLERGIC RHINITIS   . Allergy   . ANXIETY   . ARM PAIN, RIGHT   . BENIGN POSITIONAL VERTIGO   . Carcinoma in situ of colon   . CONSTIPATION   . CYSTOCELE WITH INCOMPLETE UTERINE PROLAPSE   . ESOPHAGEAL STRICTURE   . FATIGUE   . FLANK PAIN, LEFT   . Headache(784.0)   . HIP PAIN, LEFT   . HYPERCHOLESTEROLEMIA   . HYPERGLYCEMIA   . HYPERTENSION, BENIGN ESSENTIAL 03/03/2007  . LEG EDEMA, BILATERAL   . Microscopic hematuria   . OBESITY   . OSTEOARTHRITIS, KNEE   . OSTEOPOROSIS   . Peripheral neuropathy, idiopathic   . Problems with swallowing and mastication   . SINUSITIS, RECURRENT   . SPRAIN&STRAIN OTH SPEC SITES SHOULDER&UPPER ARM     Past Surgical History:  Procedure Laterality Date  . LEFT HEART CATHETERIZATION WITH CORONARY ANGIOGRAM N/A 12/02/2010   Procedure: LEFT HEART CATHETERIZATION WITH CORONARY ANGIOGRAM;  Surgeon: Peter M Swaziland, MD;  Location: [facility] CATH LAB;  Service: Cardiovascular;:: NORMAL CORONARY ARTERIES - NORMAL LV FUNCTION & EDP.    Marland Kitchen TRANSTHORACIC ECHOCARDIOGRAM  11/2010   Normal EF 60-65%. No RWMA.  Mild LAD dilation c/w Gr 1 DD.  -- PRETTY NORMAL ECHO    Social History   Tobacco Use  . Smoking  status: Never Smoker  . Smokeless tobacco: Never Used  Substance Use Topics  . Alcohol use: No    Family History  Problem Relation Age of Onset  . Coronary artery disease Mother 75  . Cancer Father 11  . Heart disease Father   . Colon cancer Other   . Diabetes Other   . Coronary artery disease Sister   . Diabetes type II Sister   . Cancer Sister   . Coronary artery disease Brother   . Kidney disease Brother   .  Coronary artery disease Brother   . Breast cancer Neg Hx     Review of Systems  Constitutional: Negative for chills and fever.  Respiratory: Negative for cough and shortness of breath.   Cardiovascular: Negative for chest pain, palpitations and leg swelling.  Gastrointestinal: Negative for abdominal pain, nausea and vomiting.   Per hpi  OBJECTIVE:  Today's Vitals   10/16/19 1056 10/16/19 1138  BP: (!) 154/74 (!) 146/70  Pulse: 86   Resp: 16   Temp: 97.6 F (36.4 C)   TempSrc: Temporal   SpO2: 98%   Weight: 205 lb (93 kg)   Height: 5\' 2"  (1.575 m)    Body mass index is 37.49 kg/m.   Physical Exam Vitals and nursing note reviewed.  Constitutional:      Appearance: She is well-developed.  HENT:     Head: Normocephalic and atraumatic.     Mouth/Throat:     Pharynx: No oropharyngeal exudate.  Eyes:     General: No scleral icterus.    Extraocular Movements: Extraocular movements intact.     Conjunctiva/sclera: Conjunctivae normal.     Pupils: Pupils are equal, round, and reactive to light.  Cardiovascular:     Rate and Rhythm: Normal rate and regular rhythm.     Heart sounds: Normal heart sounds. No murmur heard.  No friction rub. No gallop.   Pulmonary:     Effort: Pulmonary effort is normal.     Breath sounds: Normal breath sounds. No wheezing, rhonchi or rales.  Musculoskeletal:     Cervical back: Neck supple.     Right lower leg: Edema present.     Left lower leg: Edema present.  Skin:    General: Skin is warm and dry.  Neurological:      Mental Status: She is alert and oriented to person, place, and time.     No results found for this or any previous visit (from the past 24 hour(s)).  No results found.   ASSESSMENT and PLAN  1. HYPERTENSION, BENIGN ESSENTIAL Not at goal. Increased HCTZ to 25mg  daily. Cont amlodipine and lisinopril  2. GAD (generalized anxiety disorder) Not controlled, increase buspar to 10mg  BID  3. Primary osteoarthritis of both knees Stable. vicodin refilled, pmp reviewed, reviewed r/se/b - HYDROcodone-acetaminophen (NORCO/VICODIN) 5-325 MG tablet; Take 1 tablet by mouth every 6 (six) hours as needed for moderate pain. For chronic pain  4. Benign paroxysmal positional vertigo, unspecified laterality - meclizine (ANTIVERT) 25 MG tablet; TAKE 1 TABLET(25 MG) BY MOUTH THREE TIMES DAILY  5. Need for prophylactic vaccination and inoculation against influenza - Flu Vaccine QUAD High Dose(Fluad)  Other orders - busPIRone (BUSPAR) 10 MG tablet; Take 1 tablet (10 mg total) by mouth 2 (two) times daily. - hydrochlorothiazide (HYDRODIURIL) 25 MG tablet; Take 1 tablet (25 mg total) by mouth daily.  Return in about 4 weeks (around 11/13/2019) for [place].    , MD Primary Care at [facility] [address] [place], [facility] [address] Ph.  [phone] Fax [phone]

## 2019-10-17 ENCOUNTER — Other Ambulatory Visit: Payer: Self-pay

## 2019-10-17 DIAGNOSIS — R609 Edema, unspecified: Secondary | ICD-10-CM

## 2019-10-17 NOTE — Telephone Encounter (Signed)
Pt is scheduled 10/30/19 with Coral Else, pt is aware.

## 2019-10-18 ENCOUNTER — Encounter: Payer: Self-pay | Admitting: Podiatry

## 2019-10-18 ENCOUNTER — Other Ambulatory Visit: Payer: Self-pay

## 2019-10-18 ENCOUNTER — Ambulatory Visit (INDEPENDENT_AMBULATORY_CARE_PROVIDER_SITE_OTHER): Payer: Medicare Other | Admitting: Podiatry

## 2019-10-18 DIAGNOSIS — E1142 Type 2 diabetes mellitus with diabetic polyneuropathy: Secondary | ICD-10-CM

## 2019-10-18 DIAGNOSIS — B351 Tinea unguium: Secondary | ICD-10-CM

## 2019-10-18 DIAGNOSIS — M79674 Pain in right toe(s): Secondary | ICD-10-CM | POA: Diagnosis not present

## 2019-10-18 DIAGNOSIS — M79675 Pain in left toe(s): Secondary | ICD-10-CM | POA: Diagnosis not present

## 2019-10-19 ENCOUNTER — Ambulatory Visit (INDEPENDENT_AMBULATORY_CARE_PROVIDER_SITE_OTHER): Payer: Medicare Other | Admitting: Family Medicine

## 2019-10-19 VITALS — BP 146/70 | Ht 62.0 in | Wt 205.0 lb

## 2019-10-19 DIAGNOSIS — Z0000 Encounter for general adult medical examination without abnormal findings: Secondary | ICD-10-CM

## 2019-10-19 NOTE — Progress Notes (Signed)
Presents today for [employer] Visit   Date of last exam: 10-16-2019  Interpreter used for this visit?   I connected with  [name] on 10/19/19 by a telephone  and verified that I am speaking with the correct person using two identifiers.   I discussed the limitations of evaluation and management by telemedicine. The patient expressed understanding and agreed to proceed.  Patient location: home  Provider location: in office  I provided 20 minutes of non face - to - face time during this encounter.   Patient Care Team: Myles Lipps, MD as PCP - General (Family Medicine) Marykay Lex, MD as PCP - Cardiology (Cardiology) Jethro Bolus, MD as Consulting Physician (Ophthalmology)   Other items to address today:   Discussed Eye/Dental Discussed Immunizations Follow up schedule Jari Sportsman 10-25 @ 10:30   Other Screening: Last screening for diabetes:07-17-2019 Last lipid screening:07/17/2019  ADVANCE DIRECTIVES: Discussed: yes On File: no Materials Provided: yes  Immunization status:  Immunization History  Administered Date(s) Administered  . Fluad Quad(high Dose 65+) 10/16/2019  . Influenza Split 10/20/2013  . Influenza Whole 01/10/2008, 12/28/2008, 12/02/2009  . Influenza, High Dose Seasonal PF 10/16/2018  . Influenza, Seasonal, Injecte, Preservative Fre 11/23/2012  . Influenza,inj,Quad PF,6+ Mos 12/24/2015, 09/16/2016  . Influenza-Unspecified 10/09/2011, 10/19/2014, 09/23/2017  . PFIZER SARS-COV-2 Vaccination 03/14/2019, 04/04/2019  . Pneumococcal Conjugate-13 10/20/2013  . Pneumococcal Polysaccharide-23 03/24/2007  . Td 09/28/2007  . Zoster Recombinat (Shingrix) 01/21/2018     There are no preventive care reminders to display for this patient.   Functional Status Survey: Is the patient deaf or have difficulty hearing?: No Does the patient have difficulty seeing, even when wearing glasses/contacts?: No Does the patient  have difficulty concentrating, remembering, or making decisions?: No Does the patient have difficulty walking or climbing stairs?: Yes (knee problems) Does the patient have difficulty dressing or bathing?: No Does the patient have difficulty doing errands alone such as visiting a doctor's office or shopping?: No   6CIT Screen 10/19/2019 10/12/2018 12/25/2016  What Year? 0 points 0 points 0 points  What month? 0 points 0 points 0 points  What time? 0 points 0 points 0 points  Count back from 20 0 points 0 points 0 points  Months in reverse 0 points 0 points 4 points  Repeat phrase 0 points 8 points 10 points  Total Score 0 8 14        Clinical Support from 10/19/2019 in [facility] at [place]  AUDIT-C Score 0       Home Environment:   Lives in one story No scattered rugs Yes grab bars Adequate lighting/ no clutter Yes trouble climbing stairs osteoarthritis of both knees   Patient Active Problem List   Diagnosis Date Noted  . Gastroesophageal reflux disease without esophagitis 07/17/2019  . Preoperative cardiovascular examination 05/13/2018  . Vitamin B12 deficiency 07/14/2017  . Primary osteoarthritis of left knee 07/07/2013  . Type 2 diabetes mellitus with microalbuminuria, without long-term current use of insulin (HCC) 05/26/2012  . Anemia 10/23/2011  . Encounter for medication monitoring 10/23/2011  . Hyponatremia 12/01/2010    Class: Acute  . Chest pain with moderate risk for cardiac etiology 11/26/2010    Class: Acute  . CYSTOCELE WITH INCOMPLETE UTERINE PROLAPSE 03/10/2010  . HYPERGLYCEMIA 02/06/2010  . ESOPHAGEAL STRICTURE 12/11/2009  . Obesity 12/09/2009  . PROBLEMS WITH SWALLOWING AND MASTICATION 12/09/2009  . MICROSCOPIC HEMATURIA 07/29/2009  . ARM PAIN, RIGHT 03/28/2009  .  Fatigue 03/28/2009  . CONSTIPATION 09/25/2008  . Sinusitis, chronic 02/23/2008  . Osteoarthrosis involving lower leg 09/28/2007  . LEG EDEMA, BILATERAL 08/31/2007  . FLANK PAIN, LEFT  06/08/2007  . SPRAIN&STRAIN OTH SPEC SITES SHOULDER&UPPER ARM 06/08/2007  . BENIGN POSITIONAL VERTIGO 04/25/2007  . Headache 03/24/2007  . HYPERCHOLESTEROLEMIA 03/03/2007  . GAD (generalized anxiety disorder) 03/03/2007  . HYPERTENSION, BENIGN ESSENTIAL 03/03/2007  . Allergic rhinitis 03/03/2007  . HIP PAIN, LEFT 03/03/2007  . Osteoporosis 03/03/2007     Past Medical History:  Diagnosis Date  . ALLERGIC RHINITIS   . Allergy   . ANXIETY   . ARM PAIN, RIGHT   . BENIGN POSITIONAL VERTIGO   . Carcinoma in situ of colon   . CONSTIPATION   . CYSTOCELE WITH INCOMPLETE UTERINE PROLAPSE   . ESOPHAGEAL STRICTURE   . FATIGUE   . FLANK PAIN, LEFT   . Headache(784.0)   . HIP PAIN, LEFT   . HYPERCHOLESTEROLEMIA   . HYPERGLYCEMIA   . HYPERTENSION, BENIGN ESSENTIAL 03/03/2007  . LEG EDEMA, BILATERAL   . Microscopic hematuria   . OBESITY   . OSTEOARTHRITIS, KNEE   . OSTEOPOROSIS   . Peripheral neuropathy, idiopathic   . Problems with swallowing and mastication   . SINUSITIS, RECURRENT   . SPRAIN&STRAIN OTH SPEC SITES SHOULDER&UPPER ARM      Past Surgical History:  Procedure Laterality Date  . LEFT HEART CATHETERIZATION WITH CORONARY ANGIOGRAM N/A 12/02/2010   Procedure: LEFT HEART CATHETERIZATION WITH CORONARY ANGIOGRAM;  Surgeon: Peter M Swaziland, MD;  Location: [facility] CATH LAB;  Service: Cardiovascular;:: NORMAL CORONARY ARTERIES - NORMAL LV FUNCTION & EDP.    Marland Kitchen TRANSTHORACIC ECHOCARDIOGRAM  11/2010   Normal EF 60-65%. No RWMA.  Mild LAD dilation c/w Gr 1 DD.  -- PRETTY NORMAL ECHO     Family History  Problem Relation Age of Onset  . Coronary artery disease Mother 19  . Cancer Father 48  . Heart disease Father   . Colon cancer Other   . Diabetes Other   . Coronary artery disease Sister   . Diabetes type II Sister   . Cancer Sister   . Coronary artery disease Brother   . Kidney disease Brother   . Coronary artery disease Brother   . Breast cancer Neg Hx      Social  History   Socioeconomic History  . Marital status: Single    Spouse name: Not on file  . Number of children: 1  . Years of education: 6  . Highest education level: High school graduate  Occupational History  . Not on file  Tobacco Use  . Smoking status: Never Smoker  . Smokeless tobacco: Never Used  Vaping Use  . Vaping Use: Never used  Substance and Sexual Activity  . Alcohol use: No  . Drug use: No  . Sexual activity: Not Currently  Other Topics Concern  . Not on file  Social History Narrative   She tries to stay active, but over the last year (20 19-20 20), she has been very limited by her right knee pain.   Is able to go grocery shopping.   Social Determinants of Health   Financial Resource Strain:   . Difficulty of Paying Living Expenses: Not on file  Food Insecurity:   . Worried About Programme researcher, broadcasting/film/video in the Last Year: Not on file  . Ran Out of Food in the Last Year: Not on file  Transportation Needs:   . Lack  of Transportation (Medical): Not on file  . Lack of Transportation (Non-Medical): Not on file  Physical Activity:   . Days of Exercise per Week: Not on file  . Minutes of Exercise per Session: Not on file  Stress:   . Feeling of Stress : Not on file  Social Connections:   . Frequency of Communication with Friends and Family: Not on file  . Frequency of Social Gatherings with Friends and Family: Not on file  . Attends Religious Services: Not on file  . Active Member of Clubs or Organizations: Not on file  . Attends Banker Meetings: Not on file  . Marital Status: Not on file  Intimate Partner Violence:   . Fear of Current or Ex-Partner: Not on file  . Emotionally Abused: Not on file  . Physically Abused: Not on file  . Sexually Abused: Not on file     No Known Allergies   Prior to Admission medications   Medication Sig Start Date End Date Taking? Authorizing Provider  ALPRAZolam (XANAX) 0.25 MG tablet TAKE 1 TABLET(0.25 MG) BY  MOUTH AT BEDTIME AS NEEDED FOR ANXIETY 09/01/19  Yes Myles Lipps, MD  amLODipine (NORVASC) 5 MG tablet TAKE 1 TABLET(5 MG) BY MOUTH DAILY 07/17/19  Yes Myles Lipps, MD  aspirin EC 81 MG tablet Take 1 tablet (81 mg total) by mouth daily. 08/21/15  Yes Sherren Mocha, MD  atorvastatin (LIPITOR) 10 MG tablet Take 1 tablet (10 mg total) by mouth daily. 01/23/19  Yes Myles Lipps, MD  azelastine (ASTELIN) 0.1 % nasal spray USE 1 SPRAY IN EACH NOSTRIL TWICE DAILY AS DIRECTED 07/14/18  Yes Myles Lipps, MD  azelastine (OPTIVAR) 0.05 % ophthalmic solution INSTILL 1 DROP IN BOTH EYES TWICE DAILY 08/09/19  Yes Myles Lipps, MD  busPIRone (BUSPAR) 10 MG tablet Take 1 tablet (10 mg total) by mouth 2 (two) times daily. 10/16/19  Yes Myles Lipps, MD  fluticasone (FLONASE) 50 MCG/ACT nasal spray Place 1 spray into both nostrils 2 (two) times daily. 09/08/18  Yes Myles Lipps, MD  gabapentin (NEURONTIN) 600 MG tablet TAKE 1 TABLET(600 MG) BY MOUTH THREE TIMES DAILY 09/04/19  Yes Myles Lipps, MD  hydrochlorothiazide (HYDRODIURIL) 25 MG tablet Take 1 tablet (25 mg total) by mouth daily. 10/16/19  Yes Myles Lipps, MD  HYDROcodone-acetaminophen (NORCO/VICODIN) 5-325 MG tablet Take 1 tablet by mouth every 6 (six) hours as needed for moderate pain. For chronic pain 10/16/19  Yes Myles Lipps, MD  lisinopril (ZESTRIL) 40 MG tablet Take 1 tablet (40 mg total) by mouth daily. 07/17/19  Yes Myles Lipps, MD  meclizine (ANTIVERT) 25 MG tablet TAKE 1 TABLET(25 MG) BY MOUTH THREE TIMES DAILY 10/16/19  Yes Myles Lipps, MD  metFORMIN (GLUCOPHAGE) 500 MG tablet TAKE 1 TABLET(500 MG) BY MOUTH TWICE DAILY WITH A MEAL 05/11/19  Yes Myles Lipps, MD  omeprazole (PRILOSEC) 40 MG capsule Take 1 capsule (40 mg total) by mouth daily. 07/17/19  Yes Myles Lipps, MD  vitamin B-12 (CYANOCOBALAMIN) 500 MCG tablet Take by mouth.   Yes [provider]  glucose blood test strip 1 each by Other  route daily. Use as instructed Patient not taking: Reported on 10/19/2019 09/08/16   Sherren Mocha, MD     Depression screen [facility] 2/9 10/19/2019 10/16/2019 01/23/2019 01/23/2019 10/20/2018  Decreased Interest 3 3 0 0 0  Down, Depressed, Hopeless 2 2 1  0 0  PHQ - 2 Score 5 5 1  0 0  Altered sleeping 3 3 2  - -  Tired, decreased energy 3 3 1  - -  Change in appetite 1 1 0 - -  Feeling bad or failure about yourself  0 0 0 - -  Trouble concentrating 3 3 1  - -  Moving slowly or fidgety/restless 3 3 0 - -  Suicidal thoughts 0 0 0 - -  PHQ-9 Score 18 18 5  - -  Difficult doing work/chores Not difficult at all Not difficult at all - - -  Some recent data might be hidden     Fall Risk  10/16/2019 07/17/2019 01/23/2019 10/20/2018 10/12/2018  Falls in the past year? 0 0 0 0 0  Number falls in past yr: - 0 0 0 0  Injury with Fall? - 0 0 0 0  Follow up Falls evaluation completed - Falls evaluation completed - Falls evaluation completed;Education provided;Falls prevention discussed      PHYSICAL EXAM: BP (!) 146/70 Comment: taken from a previous visit/ not in clinic  Ht 5\' 2"  (1.575 m)   Wt 205 lb (93 kg)   BMI 37.49 kg/m    Wt Readings from Last 3 Encounters:  10/19/19 205 lb (93 kg)  10/16/19 205 lb (93 kg)  09/20/19 190 lb (86.2 kg)    Medicare annual wellness visit, subsequent    Education/Counseling provided regarding diet and exercise, prevention of chronic diseases, smoking/tobacco cessation, if applicable, and reviewed "Covered Medicare Preventive Services."

## 2019-10-19 NOTE — Progress Notes (Signed)
Subjective: Casey [name] presents today for follow up of preventative diabetic foot care and painful mycotic nails b/l that are difficult to trim. Pain interferes with ambulation. Aggravating factors include wearing enclosed shoe gear. Pain is relieved with periodic professional debridement.   She voices no new pedal concerns on today's visit.  PCP is Dr. Koren Shiver. Last visit was 07/17/2019.  No Known Allergies   Objective: There were no vitals filed for this visit.  Pt 84 y.o. year old African American female  in NAD. AAO x 3.   Vascular Examination:  Capillary refill time to digits immediate b/l. Palpable DP pulses b/l. Palpable PT pulses b/l. Pedal hair absent b/l Skin temperature gradient within normal limits b/l.  Dermatological Examination: Pedal skin with normal turgor, texture and tone bilaterally. No open wounds bilaterally. No interdigital macerations bilaterally. Toenails 1-5 b/l elongated, dystrophic, thickened, crumbly with subungual debris and tenderness to dorsal palpation.  Musculoskeletal: Normal muscle strength 5/5 to all lower extremity muscle groups bilaterally, no pain crepitus or joint limitation noted with ROM b/l and pes planus deformity noted  Neurological: Protective sensation diminished with 10g monofilament b/l.  Assessment: 1. Pain due to onychomycosis of toenails of both feet   2. Diabetic peripheral neuropathy associated with type 2 diabetes mellitus (HCC)    Plan: -Examined patient. -No new findings. No new orders. -Continue diabetic foot care principles. -Toenails 1-5 b/l were debrided in length and girth with sterile nail nippers and dremel without iatrogenic bleeding.  -Patient to report any pedal injuries to medical professional immediately. -Patient to continue soft, supportive shoe gear daily. -Patient/POA to call should there be question/concern in the interim.  No follow-ups on file.

## 2019-10-19 NOTE — Patient Instructions (Signed)
Thank you for taking time to come for your Medicare Wellness Visit. I appreciate your ongoing commitment to your health goals. Please review the following plan we discussed and let me know if I can assist you in the future.    LPN  Preventive Care 82 Years and Older, Female Preventive care refers to lifestyle choices and visits with your health care provider that can promote health and wellness. This includes:  A yearly physical exam. This is also called an annual well check.  Regular dental and eye exams.  Immunizations.  Screening for certain conditions.  Healthy lifestyle choices, such as diet and exercise. What can I expect for my preventive care visit? Physical exam Your health care provider will check:  Height and weight. These may be used to calculate body mass index (BMI), which is a measurement that tells if you are at a healthy weight.  Heart rate and blood pressure.  Your skin for abnormal spots. Counseling Your health care provider may ask you questions about:  Alcohol, tobacco, and drug use.  Emotional well-being.  Home and relationship well-being.  Sexual activity.  Eating habits.  History of falls.  Memory and ability to understand (cognition).  Work and work environment.  Pregnancy and menstrual history. What immunizations do I need?  Influenza (flu) vaccine  This is recommended every year. Tetanus, diphtheria, and pertussis (Tdap) vaccine  You may need a Td booster every 10 years. Varicella (chickenpox) vaccine  You may need this vaccine if you have not already been vaccinated. Zoster (shingles) vaccine  You may need this after age 82. Pneumococcal conjugate (PCV13) vaccine  One dose is recommended after age 82. Pneumococcal polysaccharide (PPSV23) vaccine  One dose is recommended after age 82. Measles, mumps, and rubella (MMR) vaccine  You may need at least one dose of MMR if you were born in 1957 or later. You may also  need a second dose. Meningococcal conjugate (MenACWY) vaccine  You may need this if you have certain conditions. Hepatitis A vaccine  You may need this if you have certain conditions or if you travel or work in places where you may be exposed to hepatitis A. Hepatitis B vaccine  You may need this if you have certain conditions or if you travel or work in places where you may be exposed to hepatitis B. Haemophilus influenzae type b (Hib) vaccine  You may need this if you have certain conditions. You may receive vaccines as individual doses or as more than one vaccine together in one shot (combination vaccines). Talk with your health care provider about the risks and benefits of combination vaccines. What tests do I need? Blood tests  Lipid and cholesterol levels. These may be checked every 5 years, or more frequently depending on your overall health.  Hepatitis C test.  Hepatitis B test. Screening  Lung cancer screening. You may have this screening every year starting at age 82 if you have a 30-pack-year history of smoking and currently smoke or have quit within the past 15 years.  Colorectal cancer screening. All adults should have this screening starting at age 82 and continuing until age 82. Your health care provider may recommend screening at age 45 if you are at increased risk. You will have tests every 1-10 years, depending on your results and the type of screening test.  Diabetes screening. This is done by checking your blood sugar (glucose) after you have not eaten for a while (fasting). You may have this done every 1-3   years.  Mammogram. This may be done every 1-2 years. Talk with your health care provider about how often you should have regular mammograms.  BRCA-related cancer screening. This may be done if you have a family history of breast, ovarian, tubal, or peritoneal cancers. Other tests  Sexually transmitted disease (STD) testing.  Bone density scan. This is done  to screen for osteoporosis. You may have this done starting at age 82. Follow these instructions at home: Eating and drinking  Eat a diet that includes fresh fruits and vegetables, whole grains, lean protein, and low-fat dairy products. Limit your intake of foods with high amounts of sugar, saturated fats, and salt.  Take vitamin and mineral supplements as recommended by your health care provider.  Do not drink alcohol if your health care provider tells you not to drink.  If you drink alcohol: ? Limit how much you have to 0-1 drink a day. ? Be aware of how much alcohol is in your drink. In the [place] one drink equals one 12 oz bottle of beer (355 mL), one 5 oz glass of wine (148 mL), or one 1 oz glass of hard liquor (44 mL). Lifestyle  Take daily care of your teeth and gums.  Stay active. Exercise for at least 30 minutes on 5 or more days each week.  Do not use any products that contain nicotine or tobacco, such as cigarettes, e-cigarettes, and chewing tobacco. If you need help quitting, ask your health care provider.  If you are sexually active, practice safe sex. Use a condom or other form of protection in order to prevent STIs (sexually transmitted infections).  Talk with your health care provider about taking a low-dose aspirin or statin. What's next?  Go to your health care provider once a year for a well check visit.  Ask your health care provider how often you should have your eyes and teeth checked.  Stay up to date on all vaccines. This information is not intended to replace advice given to you by your health care provider. Make sure you discuss any questions you have with your health care provider. Document Revised: 12/30/2017 Document Reviewed: 12/30/2017 Elsevier Patient Education  2020 [employer].

## 2019-10-30 ENCOUNTER — Ambulatory Visit (HOSPITAL_COMMUNITY)
Admission: RE | Admit: 2019-10-30 | Discharge: 2019-10-30 | Disposition: A | Discharge status: Home / Self Care | Payer: Medicare Other | Source: Ambulatory Visit | Attending: Surgery | Admitting: Surgery

## 2019-10-30 ENCOUNTER — Encounter: Payer: Self-pay | Admitting: Surgery

## 2019-10-30 ENCOUNTER — Other Ambulatory Visit: Payer: Self-pay

## 2019-10-30 ENCOUNTER — Ambulatory Visit (INDEPENDENT_AMBULATORY_CARE_PROVIDER_SITE_OTHER): Payer: Medicare Other | Admitting: Surgery

## 2019-10-30 VITALS — BP 133/80 | HR 76 | Temp 97.6°F | Resp 20 | Ht 62.0 in | Wt 209.0 lb

## 2019-10-30 DIAGNOSIS — I999 Unspecified disorder of circulatory system: Secondary | ICD-10-CM | POA: Diagnosis not present

## 2019-10-30 DIAGNOSIS — I70213 Atherosclerosis of native arteries of extremities with intermittent claudication, bilateral legs: Secondary | ICD-10-CM

## 2019-10-30 DIAGNOSIS — R609 Edema, unspecified: Secondary | ICD-10-CM | POA: Diagnosis not present

## 2019-10-30 NOTE — Progress Notes (Signed)
[facility]  Patient name: Casey [name] MRN: [id number] DOB: February 07, 1935 Sex: female   REQUESTING PROVIDER:    Dr. Cleophas Dunker   REASON FOR CONSULT:    PAD  HISTORY OF PRESENT ILLNESS:   Casey [name] is a 84 y.o. female, who is referred for evaluation of PAD.  The patient is currently being considered for knee replacement.  On physical exam, pedal pulses were not palpable, and with her history of diabetes, a formal vascular evaluation was requested.  The patient states that her walking is limited by her knees.  She will occasionally get cramps but these occur mainly at night.  Patient takes a statin for hypercholesterolemia.  She is on an ACE inhibitor for hypertension.  She is a non-smoker.  PAST MEDICAL HISTORY    Past Medical History:  Diagnosis Date  . ALLERGIC RHINITIS   . Allergy   . ANXIETY   . ARM PAIN, RIGHT   . BENIGN POSITIONAL VERTIGO   . Carcinoma in situ of colon   . CONSTIPATION   . CYSTOCELE WITH INCOMPLETE UTERINE PROLAPSE   . Diabetes mellitus without complication (HCC)   . ESOPHAGEAL STRICTURE   . FATIGUE   . FLANK PAIN, LEFT   . Headache(784.0)   . HIP PAIN, LEFT   . HYPERCHOLESTEROLEMIA   . HYPERGLYCEMIA   . HYPERTENSION, BENIGN ESSENTIAL 03/03/2007  . LEG EDEMA, BILATERAL   . Microscopic hematuria   . OBESITY   . OSTEOARTHRITIS, KNEE   . OSTEOPOROSIS   . Peripheral neuropathy, idiopathic   . Problems with swallowing and mastication   . SINUSITIS, RECURRENT   . SPRAIN&STRAIN OTH SPEC SITES SHOULDER&UPPER ARM      FAMILY HISTORY   Family History  Problem Relation Age of Onset  . Coronary artery disease Mother 52  . Cancer Father 39  . Heart disease Father   . Colon cancer Other   . Diabetes Other   . Coronary artery disease Sister   . Diabetes type II Sister   . Cancer Sister   . Coronary artery disease Brother   . Kidney disease Brother   . Coronary artery disease  Brother   . Breast cancer Neg Hx     SOCIAL HISTORY:   Social History   Socioeconomic History  . Marital status: Single    Spouse name: Not on file  . Number of children: 1  . Years of education: 77  . Highest education level: High school graduate  Occupational History  . Not on file  Tobacco Use  . Smoking status: Never Smoker  . Smokeless tobacco: Never Used  Vaping Use  . Vaping Use: Never used  Substance and Sexual Activity  . Alcohol use: No  . Drug use: No  . Sexual activity: Not Currently  Other Topics Concern  . Not on file  Social History Narrative   She tries to stay active, but over the last year (20 19-20 20), she has been very limited by her right knee pain.   Is able to go grocery shopping.   Social Determinants of Health   Financial Resource Strain:   . Difficulty of Paying Living Expenses: Not on file  Food Insecurity:   . Worried About Programme researcher, broadcasting/film/video in the Last Year: Not on file  . Ran Out of Food in the Last Year: Not on file  Transportation Needs:   . Lack of Transportation (Medical): Not on file  . Lack of  Transportation (Non-Medical): Not on file  Physical Activity:   . Days of Exercise per Week: Not on file  . Minutes of Exercise per Session: Not on file  Stress:   . Feeling of Stress : Not on file  Social Connections:   . Frequency of Communication with Friends and Family: Not on file  . Frequency of Social Gatherings with Friends and Family: Not on file  . Attends Religious Services: Not on file  . Active Member of Clubs or Organizations: Not on file  . Attends Banker Meetings: Not on file  . Marital Status: Not on file  Intimate Partner Violence:   . Fear of Current or Ex-Partner: Not on file  . Emotionally Abused: Not on file  . Physically Abused: Not on file  . Sexually Abused: Not on file    ALLERGIES:    No Known Allergies  CURRENT MEDICATIONS:    Current Outpatient Medications  Medication Sig  Dispense Refill  . ALPRAZolam (XANAX) 0.25 MG tablet TAKE 1 TABLET(0.25 MG) BY MOUTH AT BEDTIME AS NEEDED FOR ANXIETY 30 tablet 3  . amLODipine (NORVASC) 5 MG tablet TAKE 1 TABLET(5 MG) BY MOUTH DAILY 90 tablet 1  . aspirin EC 81 MG tablet Take 1 tablet (81 mg total) by mouth daily.    Marland Kitchen atorvastatin (LIPITOR) 10 MG tablet Take 1 tablet (10 mg total) by mouth daily. 90 tablet 3  . azelastine (ASTELIN) 0.1 % nasal spray USE 1 SPRAY IN EACH NOSTRIL TWICE DAILY AS DIRECTED 30 mL 3  . azelastine (OPTIVAR) 0.05 % ophthalmic solution INSTILL 1 DROP IN BOTH EYES TWICE DAILY 6 mL 3  . busPIRone (BUSPAR) 10 MG tablet Take 1 tablet (10 mg total) by mouth 2 (two) times daily. 180 tablet 1  . fluticasone (FLONASE) 50 MCG/ACT nasal spray Place 1 spray into both nostrils 2 (two) times daily. 16 g 6  . gabapentin (NEURONTIN) 600 MG tablet TAKE 1 TABLET(600 MG) BY MOUTH THREE TIMES DAILY 270 tablet 2  . glucose blood test strip 1 each by Other route daily. Use as instructed 50 each 11  . hydrochlorothiazide (HYDRODIURIL) 25 MG tablet Take 1 tablet (25 mg total) by mouth daily. 90 tablet 1  . HYDROcodone-acetaminophen (NORCO/VICODIN) 5-325 MG tablet Take 1 tablet by mouth every 6 (six) hours as needed for moderate pain. For chronic pain 90 tablet 0  . lisinopril (ZESTRIL) 40 MG tablet Take 1 tablet (40 mg total) by mouth daily. 90 tablet 1  . meclizine (ANTIVERT) 25 MG tablet TAKE 1 TABLET(25 MG) BY MOUTH THREE TIMES DAILY 270 tablet 0  . metFORMIN (GLUCOPHAGE) 500 MG tablet TAKE 1 TABLET(500 MG) BY MOUTH TWICE DAILY WITH A MEAL 180 tablet 1  . omeprazole (PRILOSEC) 40 MG capsule Take 1 capsule (40 mg total) by mouth daily. 30 capsule 5  . vitamin B-12 (CYANOCOBALAMIN) 500 MCG tablet Take by mouth.     No current facility-administered medications for this visit.    REVIEW OF SYSTEMS:   [X]  denotes positive finding, [ ]  denotes negative finding Cardiac  Comments:  Chest pain or chest pressure: x   Shortness  of breath upon exertion: x   Short of breath when lying flat:    Irregular heart rhythm:        Vascular    Pain in calf, thigh, or hip brought on by ambulation:    Pain in feet at night that wakes you up from your sleep:     Blood  clot in your veins:    Leg swelling:         Pulmonary    Oxygen at home:    Productive cough:     Wheezing:         Neurologic    Sudden weakness in arms or legs:     Sudden numbness in arms or legs:     Sudden onset of difficulty speaking or slurred speech:    Temporary loss of vision in one eye:     Problems with dizziness:         Gastrointestinal    Blood in stool:      Vomited blood:         Genitourinary    Burning when urinating:     Blood in urine:        Psychiatric    Major depression:         Hematologic    Bleeding problems:    Problems with blood clotting too easily:        Skin    Rashes or ulcers:        Constitutional    Fever or chills:     PHYSICAL EXAM:   Vitals:   10/30/19 0832  BP: 133/80  Pulse: 76  Resp: 20  Temp: 97.6 F (36.4 C)  SpO2: 98%  Weight: 209 lb (94.8 kg)  Height: 5\' 2"  (1.575 m)    GENERAL: The patient is a well-nourished female, in no acute distress. The vital signs are documented above. CARDIAC: There is a regular rate and rhythm.  VASCULAR: 1+ bilateral edema.  Pedal pulses are nonpalpable. PULMONARY: Nonlabored respirations ABDOMEN: Soft and non-tender with normal pitched bowel sounds.  MUSCULOSKELETAL: There are no major deformities or cyanosis. NEUROLOGIC: No focal weakness or paresthesias are detected. SKIN: There are no ulcers or rashes noted. PSYCHIATRIC: The patient has a normal affect.  STUDIES:   I have reviewed the following vascular lab studies:  +-------+-----------+-----------+------------+------------+  ABI/TBIToday's ABIToday's TBIPrevious ABIPrevious TBI  +-------+-----------+-----------+------------+------------+  Right [place]     0.89                   +-------+-----------+-----------+------------+------------+  Left  1.24    [place]                   +-------+-----------+-----------+------------+------------+  All waveforms are triphasic Left toe pressure:  187 Right toe pressure:  124 ASSESSMENT and PLAN   The patient has undergone formal vascular testing with ABIs and toe pressures.  Her ABIs are likely falsely elevated due to noncompressible vessels, however waveforms are all triphasic and her toe pressures are normal.  At this time, I do not think she has significant peripheral vascular disease.  Obviously with her history of diabetes, this will need to be monitored in the future.  She is cleared from a vascular standpoint to proceed with orthopedic surgery.  She will follow-up on an as-needed basis   , MD, FACS [facility] [phone] Pager [phone]

## 2019-11-13 ENCOUNTER — Ambulatory Visit: Payer: Medicare Other | Admitting: Registered Nurse

## 2019-11-17 ENCOUNTER — Other Ambulatory Visit: Payer: Self-pay

## 2019-11-17 ENCOUNTER — Encounter: Payer: Self-pay | Admitting: Registered Nurse

## 2019-11-17 ENCOUNTER — Ambulatory Visit (INDEPENDENT_AMBULATORY_CARE_PROVIDER_SITE_OTHER): Payer: Medicare Other | Admitting: Registered Nurse

## 2019-11-17 VITALS — BP 166/81 | HR 78 | Temp 98.0°F | Resp 18 | Ht 62.0 in | Wt 207.4 lb

## 2019-11-17 DIAGNOSIS — R809 Proteinuria, unspecified: Secondary | ICD-10-CM

## 2019-11-17 DIAGNOSIS — R413 Other amnesia: Secondary | ICD-10-CM

## 2019-11-17 DIAGNOSIS — I10 Essential (primary) hypertension: Secondary | ICD-10-CM

## 2019-11-17 DIAGNOSIS — F321 Major depressive disorder, single episode, moderate: Secondary | ICD-10-CM

## 2019-11-17 DIAGNOSIS — M170 Bilateral primary osteoarthritis of knee: Secondary | ICD-10-CM

## 2019-11-17 DIAGNOSIS — E1129 Type 2 diabetes mellitus with other diabetic kidney complication: Secondary | ICD-10-CM | POA: Diagnosis not present

## 2019-11-17 DIAGNOSIS — F411 Generalized anxiety disorder: Secondary | ICD-10-CM | POA: Diagnosis not present

## 2019-11-17 DIAGNOSIS — Z7689 Persons encountering health services in other specified circumstances: Secondary | ICD-10-CM

## 2019-11-17 MED ORDER — HYDROXYZINE HCL 10 MG PO TABS: 10.0000 mg | ORAL_TABLET | Freq: Three times a day (TID) | ORAL | 0 refills | Status: DC | PRN

## 2019-11-17 MED ORDER — METFORMIN HCL 500 MG PO TABS: 500.0000 mg | ORAL_TABLET | Freq: Two times a day (BID) | ORAL | 1 refills | Status: DC

## 2019-11-17 MED ORDER — FUROSEMIDE 40 MG PO TABS: 40.0000 mg | ORAL_TABLET | Freq: Every day | ORAL | 1 refills | Status: DC

## 2019-11-17 MED ORDER — HYDROCODONE-ACETAMINOPHEN 5-325 MG PO TABS: 1.0000 | ORAL_TABLET | Freq: Four times a day (QID) | ORAL | 0 refills | Status: DC | PRN

## 2019-11-17 NOTE — Patient Instructions (Addendum)
Ms. Inghram -   [name] to meet you today. Here are the medication changes we discussed:  STOP taking hydrochlorothiazide 25mg .  STOP taking buspirone 10mg   START taking furosemide 20mg  in the mornings.  START taking hydroxyzine 10mg  three times daily as needed for anxiety.  I have placed referrals to: Psychiatry Neurology  I am interested to see what they have to say about your mental health and your memory, and how they may be affecting one another.   I would like to see you again in around 2 months. Please return sooner if you have any concerns.   , NP     If you have lab work done today you will be contacted with your lab results within the next 2 weeks.  If you have not heard from then please contact . The fastest way to get your results is to register for My Chart.   IF you received an x-ray today, you will receive an invoice from [facility]. Please contact [facility] at [phone] with questions or concerns regarding your invoice.   IF you received labwork today, you will receive an invoice from [facility]. Please contact [facility] at [phone] with questions or concerns regarding your invoice.   Our billing staff will not be able to assist you with questions regarding bills from these companies.  You will be contacted with the lab results as soon as they are available. The fastest way to get your results is to activate your My Chart account. Instructions are located on the last page of this paperwork. If you have not heard from [facility] regarding the results in 2 weeks, please contact this office.

## 2019-11-27 ENCOUNTER — Encounter: Payer: Self-pay | Admitting: Registered Nurse

## 2019-11-27 NOTE — Progress Notes (Signed)
Established Patient Office Visit  Subjective:  Patient ID: Casey [name], female    DOB: [birth year]  Age: 84 y.o. MRN: [id number]  CC:  Chief Complaint  Patient presents with  . Transitions Of Care    Patient states she is here for TOC with Markale Birdsell. Per patient she would like to discuss depression and medication  . Medication Refill    patient states she will need medication refill on a few medication    HPI Casey [name] presents for follow up   Formerly a patient of Dr. Leretha Pol  Depression: ongoing for some time. Seems to be stable. Anxiety if often worse. Has been using alprazolam 0.25mg  PO qhs PRN. Good effect. Sleeps well. Does not want to take this during the day, interested in alternative. Declines offer of daily medication like SSRI. No interest in counseling. Denies HI/SI. Has some support system available to her. Does report some memory loss- both short and long term. Unsure how much this has affected her. Seems to be gradually worsening.  Needs refill on Lasix. Swelling in lower extremities due to OA of both knees, T2DM likely contributing to vascular disease, and age. Tolerates well. Takes 40mg  PO qd PRN  Otherwise feeling well. Histories reviewed with patient.   Past Medical History:  Diagnosis Date  . ALLERGIC RHINITIS   . Allergy   . ANXIETY   . ARM PAIN, RIGHT   . BENIGN POSITIONAL VERTIGO   . Carcinoma in situ of colon   . CONSTIPATION   . CYSTOCELE WITH INCOMPLETE UTERINE PROLAPSE   . Diabetes mellitus without complication (HCC)   . ESOPHAGEAL STRICTURE   . FATIGUE   . FLANK PAIN, LEFT   . Headache(784.0)   . HIP PAIN, LEFT   . HYPERCHOLESTEROLEMIA   . HYPERGLYCEMIA   . HYPERTENSION, BENIGN ESSENTIAL 03/03/2007  . LEG EDEMA, BILATERAL   . Microscopic hematuria   . OBESITY   . OSTEOARTHRITIS, KNEE   . OSTEOPOROSIS   . Peripheral neuropathy, idiopathic   . Problems with swallowing and mastication   . SINUSITIS, RECURRENT   . SPRAIN&STRAIN OTH  SPEC SITES SHOULDER&UPPER ARM     Past Surgical History:  Procedure Laterality Date  . LEFT HEART CATHETERIZATION WITH CORONARY ANGIOGRAM N/A 12/02/2010   Procedure: LEFT HEART CATHETERIZATION WITH CORONARY ANGIOGRAM;  Surgeon: Peter M Swaziland, MD;  Location: [facility] CATH LAB;  Service: Cardiovascular;:: NORMAL CORONARY ARTERIES - NORMAL LV FUNCTION & EDP.    Marland Kitchen TRANSTHORACIC ECHOCARDIOGRAM  11/2010   Normal EF 60-65%. No RWMA.  Mild LAD dilation c/w Gr 1 DD.  -- PRETTY NORMAL ECHO    Family History  Problem Relation Age of Onset  . Coronary artery disease Mother 47  . Cancer Father 68  . Heart disease Father   . Colon cancer Other   . Diabetes Other   . Coronary artery disease Sister   . Diabetes type II Sister   . Cancer Sister   . Coronary artery disease Brother   . Kidney disease Brother   . Coronary artery disease Brother   . Breast cancer Neg Hx     Social History   Socioeconomic History  . Marital status: Single    Spouse name: Not on file  . Number of children: 1  . Years of education: 51  . Highest education level: High school graduate  Occupational History  . Not on file  Tobacco Use  . Smoking status: Never Smoker  . Smokeless tobacco: Never Used  Vaping Use  . Vaping Use: Never used  Substance and Sexual Activity  . Alcohol use: No  . Drug use: No  . Sexual activity: Not Currently  Other Topics Concern  . Not on file  Social History Narrative   She tries to stay active, but over the last year (20 19-20 20), she has been very limited by her right knee pain.   Is able to go grocery shopping.   Social Determinants of Health   Financial Resource Strain:   . Difficulty of Paying Living Expenses: Not on file  Food Insecurity:   . Worried About Programme researcher, broadcasting/film/video in the Last Year: Not on file  . Ran Out of Food in the Last Year: Not on file  Transportation Needs:   . Lack of Transportation (Medical): Not on file  . Lack of Transportation (Non-Medical): Not  on file  Physical Activity:   . Days of Exercise per Week: Not on file  . Minutes of Exercise per Session: Not on file  Stress:   . Feeling of Stress : Not on file  Social Connections:   . Frequency of Communication with Friends and Family: Not on file  . Frequency of Social Gatherings with Friends and Family: Not on file  . Attends Religious Services: Not on file  . Active Member of Clubs or Organizations: Not on file  . Attends Banker Meetings: Not on file  . Marital Status: Not on file  Intimate Partner Violence:   . Fear of Current or Ex-Partner: Not on file  . Emotionally Abused: Not on file  . Physically Abused: Not on file  . Sexually Abused: Not on file    Outpatient Medications Prior to Visit  Medication Sig Dispense Refill  . ALPRAZolam (XANAX) 0.25 MG tablet TAKE 1 TABLET(0.25 MG) BY MOUTH AT BEDTIME AS NEEDED FOR ANXIETY 30 tablet 3  . amLODipine (NORVASC) 5 MG tablet TAKE 1 TABLET(5 MG) BY MOUTH DAILY 90 tablet 1  . aspirin EC 81 MG tablet Take 1 tablet (81 mg total) by mouth daily.    Marland Kitchen atorvastatin (LIPITOR) 10 MG tablet Take 1 tablet (10 mg total) by mouth daily. 90 tablet 3  . azelastine (OPTIVAR) 0.05 % ophthalmic solution INSTILL 1 DROP IN BOTH EYES TWICE DAILY 6 mL 3  . gabapentin (NEURONTIN) 600 MG tablet TAKE 1 TABLET(600 MG) BY MOUTH THREE TIMES DAILY 270 tablet 2  . glucose blood test strip 1 each by Other route daily. Use as instructed 50 each 11  . lisinopril (ZESTRIL) 40 MG tablet Take 1 tablet (40 mg total) by mouth daily. 90 tablet 1  . meclizine (ANTIVERT) 25 MG tablet TAKE 1 TABLET(25 MG) BY MOUTH THREE TIMES DAILY 270 tablet 0  . omeprazole (PRILOSEC) 40 MG capsule Take 1 capsule (40 mg total) by mouth daily. 30 capsule 5  . busPIRone (BUSPAR) 10 MG tablet Take 1 tablet (10 mg total) by mouth 2 (two) times daily. 180 tablet 1  . hydrochlorothiazide (HYDRODIURIL) 25 MG tablet Take 1 tablet (25 mg total) by mouth daily. 90 tablet 1  .  HYDROcodone-acetaminophen (NORCO/VICODIN) 5-325 MG tablet Take 1 tablet by mouth every 6 (six) hours as needed for moderate pain. For chronic pain 90 tablet 0  . metFORMIN (GLUCOPHAGE) 500 MG tablet TAKE 1 TABLET(500 MG) BY MOUTH TWICE DAILY WITH A MEAL 180 tablet 1  . azelastine (ASTELIN) 0.1 % nasal spray USE 1 SPRAY IN EACH NOSTRIL TWICE DAILY AS DIRECTED (Patient  not taking: Reported on 11/17/2019) 30 mL 3  . fluticasone (FLONASE) 50 MCG/ACT nasal spray Place 1 spray into both nostrils 2 (two) times daily. (Patient not taking: Reported on 11/17/2019) 16 g 6  . vitamin B-12 (CYANOCOBALAMIN) 500 MCG tablet Take by mouth.     No facility-administered medications prior to visit.    No Known Allergies  ROS Review of Systems  Constitutional: Negative.   HENT: Negative.   Eyes: Negative.   Respiratory: Negative.   Cardiovascular: Positive for leg swelling. Negative for chest pain and palpitations.  Gastrointestinal: Negative.   Genitourinary: Negative.   Musculoskeletal: Negative.   Skin: Negative.   Neurological: Negative.   Psychiatric/Behavioral: Positive for dysphoric mood and sleep disturbance. The patient is nervous/anxious.       Objective:    Physical Exam Vitals and nursing note reviewed.  Constitutional:      General: She is not in acute distress.    Appearance: Normal appearance. She is normal weight. She is not ill-appearing, toxic-appearing or diaphoretic.  Cardiovascular:     Rate and Rhythm: Normal rate and regular rhythm.     Heart sounds: Normal heart sounds. No murmur heard.  No friction rub. No gallop.   Pulmonary:     Effort: Pulmonary effort is normal. No respiratory distress.     Breath sounds: Normal breath sounds. No stridor. No wheezing, rhonchi or rales.  Chest:     Chest wall: No tenderness.  Skin:    General: Skin is warm and dry.  Neurological:     General: No focal deficit present.     Mental Status: She is alert and oriented to person, place,  and time. Mental status is at baseline.  Psychiatric:        Mood and Affect: Mood normal.        Behavior: Behavior normal.        Thought Content: Thought content normal.        Judgment: Judgment normal.     BP (!) 166/81   Pulse 78   Temp 98 F (36.7 C) (Temporal)   Resp 18   Ht 5\' 2"  (1.575 m)   Wt 207 lb 6.4 oz (94.1 kg)   SpO2 98%   BMI 37.93 kg/m  Wt Readings from Last 3 Encounters:  11/17/19 207 lb 6.4 oz (94.1 kg)  10/30/19 209 lb (94.8 kg)  10/19/19 205 lb (93 kg)     Health Maintenance Due  Topic Date Due  . OPHTHALMOLOGY EXAM  11/24/2019    There are no preventive care reminders to display for this patient.  Lab Results  Component Value Date   TSH 1.300 07/17/2019   Lab Results  Component Value Date   WBC 6.5 10/20/2018   HGB 11.2 10/20/2018   HCT 35.1 10/20/2018   MCV 84 10/20/2018   PLT 336 10/20/2018   Lab Results  Component Value Date   NA 138 07/17/2019   K 4.4 07/17/2019   CO2 21 07/17/2019   GLUCOSE 120 (H) 07/17/2019   BUN 15 07/17/2019   CREATININE 0.93 07/17/2019   BILITOT 0.3 07/17/2019   ALKPHOS 96 07/17/2019   AST 16 07/17/2019   ALT 12 07/17/2019   PROT 6.9 07/17/2019   ALBUMIN 4.0 07/17/2019   CALCIUM 9.8 07/17/2019   Lab Results  Component Value Date   CHOL 175 07/17/2019   Lab Results  Component Value Date   HDL 87 07/17/2019   Lab Results  Component Value Date  LDLCALC 78 07/17/2019   Lab Results  Component Value Date   TRIG 47 07/17/2019   Lab Results  Component Value Date   CHOLHDL 2.0 07/17/2019   Lab Results  Component Value Date   HGBA1C 7.3 (A) 07/17/2019      Assessment & Plan:   Problem List Items Addressed This Visit      Cardiovascular and Mediastinum   HYPERTENSION, BENIGN ESSENTIAL (Chronic)   Relevant Medications   furosemide (LASIX) 40 MG tablet     Endocrine   Type 2 diabetes mellitus with microalbuminuria, without long-term current use of insulin (HCC) (Chronic)   Relevant  Medications   metFORMIN (GLUCOPHAGE) 500 MG tablet     Other   GAD (generalized anxiety disorder) - Primary   Relevant Medications   hydrOXYzine (ATARAX/VISTARIL) 10 MG tablet   Other Relevant Orders   Ambulatory referral to Psychiatry   Ambulatory referral to Neurology    Other Visit Diagnoses    Primary osteoarthritis of both knees       Relevant Medications   HYDROcodone-acetaminophen (NORCO/VICODIN) 5-325 MG tablet   Memory loss       Relevant Orders   Ambulatory referral to Psychiatry   Ambulatory referral to Neurology   Current moderate episode of major depressive disorder, unspecified whether recurrent (HCC)       Relevant Medications   hydrOXYzine (ATARAX/VISTARIL) 10 MG tablet   Other Relevant Orders   Ambulatory referral to Psychiatry   Ambulatory referral to Neurology   Encounter to establish care          Meds ordered this encounter  Medications  . hydrOXYzine (ATARAX/VISTARIL) 10 MG tablet    Sig: Take 1 tablet (10 mg total) by mouth 3 (three) times daily as needed.    Dispense:  30 tablet    Refill:  0    Order Specific Question:   Supervising Provider    Answer:   Neva Seat, JEFFREY R [[id number]]  . metFORMIN (GLUCOPHAGE) 500 MG tablet    Sig: Take 1 tablet (500 mg total) by mouth 2 (two) times daily with a meal.    Dispense:  180 tablet    Refill:  1    Order Specific Question:   Supervising Provider    Answer:   Neva Seat, JEFFREY R [[id number]]  . HYDROcodone-acetaminophen (NORCO/VICODIN) 5-325 MG tablet    Sig: Take 1 tablet by mouth every 6 (six) hours as needed for moderate pain. For chronic pain    Dispense:  90 tablet    Refill:  0    Order Specific Question:   Supervising Provider    Answer:   Neva Seat, JEFFREY R [[id number]]  . furosemide (LASIX) 40 MG tablet    Sig: Take 1 tablet (40 mg total) by mouth daily.    Dispense:  90 tablet    Refill:  1    Order Specific Question:   Supervising Provider    Answer:   Neva Seat, JEFFREY R [[id number]]    Follow-up: Return in  about 2 months (around 01/17/2020) for Memory loss follow up.   PLAN  After long discussion, pt agrees to referral to psychiatry for ongoing management of her depression. Agrees that further intervention needs to take place.   Refer to neurology for worsening memory. It is apparent in our conversation. Concern for dementia or other neurological condition  Refill meds as ordered  Follow up in 2 months for memory loss and labs  Patient encouraged to call clinic with any questions, comments, or  concerns.  [name], NP

## 2019-12-11 DIAGNOSIS — I10 Essential (primary) hypertension: Secondary | ICD-10-CM | POA: Diagnosis not present

## 2019-12-11 DIAGNOSIS — M1990 Unspecified osteoarthritis, unspecified site: Secondary | ICD-10-CM | POA: Diagnosis not present

## 2019-12-11 DIAGNOSIS — Z008 Encounter for other general examination: Secondary | ICD-10-CM | POA: Diagnosis not present

## 2019-12-27 ENCOUNTER — Other Ambulatory Visit: Payer: Self-pay | Admitting: Registered Nurse

## 2019-12-27 DIAGNOSIS — M170 Bilateral primary osteoarthritis of knee: Secondary | ICD-10-CM

## 2019-12-27 NOTE — Telephone Encounter (Signed)
Requested medication (s) are due for refill today - yes  Requested medication (s) are on the active medication list -yes  Future visit scheduled -yes  Last refill: 11/17/19  Notes to clinic: Request non delegated Rx  Requested Prescriptions  Pending Prescriptions Disp Refills   HYDROcodone-acetaminophen (NORCO/VICODIN) 5-325 MG tablet 90 tablet 0    Sig: Take 1 tablet by mouth every 6 (six) hours as needed for moderate pain. For chronic pain      Not Delegated - Analgesics:  Opioid Agonist Combinations Failed - 12/27/2019  3:12 PM      Failed - This refill cannot be delegated      Failed - Urine Drug Screen completed in last 360 days      Passed - Valid encounter within last 6 months    Recent Outpatient Visits           1 month ago GAD (generalized anxiety disorder)   [facility], Gerlene Burdock, NP   2 months ago Medicare annual wellness visit, subsequent   [facility], Meda Coffee, MD   2 months ago HYPERTENSION, BENIGN ESSENTIAL   [facility], Meda Coffee, MD   5 months ago Type 2 diabetes mellitus with microalbuminuria, without long-term current use of insulin [facility])   [facility], Meda Coffee, MD   11 months ago Type 2 diabetes mellitus with microalbuminuria, without long-term current use of insulin [facility])   [facility], Meda Coffee, MD       Future Appointments             In 3 weeks [name], NP [facility] at [place], [facility]                Requested Prescriptions  Pending Prescriptions Disp Refills   HYDROcodone-acetaminophen (NORCO/VICODIN) 5-325 MG tablet 90 tablet 0    Sig: Take 1 tablet by mouth every 6 (six) hours as needed for moderate pain. For chronic pain      Not Delegated - Analgesics:  Opioid Agonist Combinations Failed - 12/27/2019  3:12 PM      Failed - This refill cannot be delegated      Failed - Urine Drug Screen completed in last 360 days      Passed - Valid  encounter within last 6 months    Recent Outpatient Visits           1 month ago GAD (generalized anxiety disorder)   [facility], Gerlene Burdock, NP   2 months ago Medicare annual wellness visit, subsequent   [facility], Meda Coffee, MD   2 months ago HYPERTENSION, BENIGN ESSENTIAL   [facility], Meda Coffee, MD   5 months ago Type 2 diabetes mellitus with microalbuminuria, without long-term current use of insulin [facility])   [facility], Meda Coffee, MD   11 months ago Type 2 diabetes mellitus with microalbuminuria, without long-term current use of insulin [facility])   [facility], Meda Coffee, MD       Future Appointments             In 3 weeks [name], NP Primary [facility] at [place], [facility]

## 2019-12-27 NOTE — Telephone Encounter (Signed)
HYDROcodone-acetaminophen (NORCO/VICODIN) 5-325 MG tabl  [facility] [phone] - [place], [place] - [phone] [facility] [address] & [address] Phone:  [phone]  Fax:  [phone]     Pt needs refill

## 2019-12-28 MED ORDER — HYDROCODONE-ACETAMINOPHEN 5-325 MG PO TABS: 1.0000 | ORAL_TABLET | Freq: Four times a day (QID) | ORAL | 0 refills | Status: DC | PRN

## 2019-12-28 NOTE — Telephone Encounter (Signed)
Patient is requesting a refill of the following medications: Requested Prescriptions   Pending Prescriptions Disp Refills   HYDROcodone-acetaminophen (NORCO/VICODIN) 5-325 MG tablet 90 tablet 0    Sig: Take 1 tablet by mouth every 6 (six) hours as needed for moderate pain. For chronic pain    Date of patient request: 12/27/19 Last office visit: 11/17/19 Date of last refill: 11/17/19 Last refill amount: 90 Follow up time period per chart: 01/22/19

## 2020-01-03 ENCOUNTER — Encounter: Payer: Self-pay | Admitting: Neurology

## 2020-01-03 ENCOUNTER — Ambulatory Visit (INDEPENDENT_AMBULATORY_CARE_PROVIDER_SITE_OTHER): Payer: Medicare Other | Admitting: Neurology

## 2020-01-03 VITALS — Ht 62.0 in | Wt 210.0 lb

## 2020-01-03 DIAGNOSIS — F418 Other specified anxiety disorders: Secondary | ICD-10-CM | POA: Diagnosis not present

## 2020-01-03 DIAGNOSIS — R41842 Visuospatial deficit: Secondary | ICD-10-CM | POA: Insufficient documentation

## 2020-01-03 DIAGNOSIS — F0390 Unspecified dementia without behavioral disturbance: Secondary | ICD-10-CM | POA: Insufficient documentation

## 2020-01-03 DIAGNOSIS — R413 Other amnesia: Secondary | ICD-10-CM | POA: Insufficient documentation

## 2020-01-03 DIAGNOSIS — R4189 Other symptoms and signs involving cognitive functions and awareness: Secondary | ICD-10-CM | POA: Diagnosis not present

## 2020-01-03 MED ORDER — TRAZODONE HCL 50 MG PO TABS: 50.0000 mg | ORAL_TABLET | Freq: Every day | ORAL | 5 refills | Status: DC

## 2020-01-03 NOTE — Progress Notes (Addendum)
Provider:  Melvyn Novas, M D  Referring Provider: [name], NP Primary Care Physician:  [name], NP  Chief Complaint  Patient presents with  . New Patient (Initial Visit)    Pt alone, rm 10. Presents today new patient. She is a nervous person and has noted that she is shakey and has tremors. She has noticed short term memory is becoming a concern. Can't remember things like she used to and she has noticed this overt ime. She takes medication to for anxiety, pain and  sleep but states that she wakes up anxious during the night.     HPI:  Casey [name] is a 84 y.o. female  Is seen here upon Consultation requested  from NP [facility] for Dementia work up.  The patient is a single female at age 36, living alone.  She has felt that she is more challenged in terms of orientation forgetfulness.  She also is also is admittedly highly anxious and worried.  Sometimes she reports trembling out of tension and anxiety.  Her visit here today began with a Mini-Mental status examination on which she scored 22 out of 30 points while being described as cooperative and pleasant and fully alert.  Her handwriting writing shows no sign of tremor she was fully oriented to time date and place but she had great trouble with spelling and with serial sevens.  She recalled 2 out of sort of 3 words, wrote a sentence but she could not copy a design or place the hands of the clock clock correctly.  She also named only 7 animals for the expected 11 or more.  So overall there is some slowing and some milder short-term memory loss more concerning is the visual spatial problem.  She reports still having hot flushes at night, waking up with palpitation and being drenched/ diaphoretic, but reports no nightmares.  She has been isolated, nobody visits but many friends and family members call her. She reports trouble with zip codes and phone numbers.   The patient has followed dr Leretha Pol for several years, for DM,  ankle edema, HTN, HLD, DM2 with neuropathy, GAD, Osteoarthritis. She is awaiting Dr Hoy Register call to schedule aTotal knee replacement on her right knee.  She is on several medications not conducive to brain health.    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 Later at the [employer], at concessions. .   Review of Systems: Out of a complete 14 system review, the patient complains of only the following symptoms, and all other reviewed systems are negative.   Memory loss;  Anxiety Desorientation.     MMSE - Mini Mental State Exam 01/03/2020  Orientation to time 5  Orientation to Place 4  Registration 3  Attention/ Calculation 0  Recall 2  Language- name 2 objects 2  Language- repeat 1  Language- follow 3 step command 3  Language- read & follow direction 1  Write a sentence 1  Copy design 0  Total score 22     Social History   Socioeconomic History  . Marital status: Single    Spouse name: Not on file  . Number of children: 1  . Years of education: 92  . 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   cooking, washing, cleaning-   Occupational History  . DOMESTIC   Tobacco Use  . Smoking status: Never Smoker  . Smokeless tobacco: Never Used  Vaping Use  . Vaping Use: Never used  Substance and Sexual Activity  . Alcohol use: No  . Drug use: No  . Sexual activity: Not Currently  Other Topics Concern  . Not on file  Social History Narrative   She tries to stay active, but over the last year (20 19-20 20), she has been very limited by her right knee pain.   Is able to go grocery shopping. Never been married, one son ( 13) born out of wedlock, no grandchildren.   Social Determinants of Health   Financial Resource Strain: Not on file  Food Insecurity: Not on file  Transportation  Needs: Not on file  Physical Activity: Not on file  Stress: Not on file  Social Connections: Not on file  Intimate Partner Violence: Not on file    Family History  Problem Relation Age of Onset  . Coronary artery disease Mother 22  . Cancer Father 20  . Heart disease Father   . Colon cancer Other   . Diabetes Other   . Coronary artery disease Sister   . Diabetes type II Sister   . Cancer Sister   . Coronary artery disease Brother   . Kidney disease Brother   . Coronary artery disease Brother   . Breast cancer Neg Hx     Past Medical History:  Diagnosis Date  . ALLERGIC RHINITIS   . Allergy   . ANXIETY   . ARM PAIN, RIGHT   . BENIGN POSITIONAL VERTIGO   . Carcinoma in situ of colon   . CONSTIPATION   . CYSTOCELE WITH INCOMPLETE UTERINE PROLAPSE   . Diabetes mellitus without complication (HCC)   . ESOPHAGEAL STRICTURE   . FATIGUE   . FLANK PAIN, LEFT   . Headache(784.0)   . HIP PAIN, LEFT   . HYPERCHOLESTEROLEMIA   . HYPERGLYCEMIA   . HYPERTENSION, BENIGN ESSENTIAL 03/03/2007  . LEG EDEMA, BILATERAL   . Microscopic hematuria   . OBESITY   . OSTEOARTHRITIS, KNEE   . OSTEOPOROSIS   . Peripheral neuropathy, idiopathic   . Problems with swallowing and mastication   . SINUSITIS, RECURRENT   . SPRAIN&STRAIN OTH SPEC SITES SHOULDER&UPPER ARM     Past Surgical History:  Procedure Laterality Date  . LEFT HEART CATHETERIZATION WITH CORONARY ANGIOGRAM N/A 12/02/2010   Procedure: LEFT HEART CATHETERIZATION WITH CORONARY ANGIOGRAM;  Surgeon: Peter M Swaziland, MD;  Location: [facility] CATH LAB;  Service: Cardiovascular;:: NORMAL CORONARY ARTERIES - NORMAL LV FUNCTION & EDP.    Marland Kitchen TRANSTHORACIC ECHOCARDIOGRAM  11/2010   Normal EF 60-65%. No RWMA.  Mild LAD dilation c/w Gr 1 DD.  -- PRETTY NORMAL ECHO    Current Outpatient Medications  Medication Sig Dispense Refill  . ALPRAZolam (XANAX) 0.25 MG tablet TAKE 1 TABLET(0.25 MG) BY MOUTH AT BEDTIME AS NEEDED FOR ANXIETY 30 tablet 3  .  amLODipine (NORVASC) 5 MG tablet TAKE 1 TABLET(5 MG) BY MOUTH DAILY 90 tablet 1  . aspirin EC 81 MG tablet Take 1 tablet (81 mg total) by mouth daily.    Marland Kitchen atorvastatin (LIPITOR) 10 MG tablet Take 1 tablet (10 mg total) by mouth daily. 90 tablet 3  . furosemide (LASIX) 40 MG tablet Take 1 tablet (40 mg total) by mouth daily. 90 tablet 1  . gabapentin (NEURONTIN) 600  MG tablet TAKE 1 TABLET(600 MG) BY MOUTH THREE TIMES DAILY 270 tablet 2  . HYDROcodone-acetaminophen (NORCO/VICODIN) 5-325 MG tablet Take 1 tablet by mouth every 6 (six) hours as needed for moderate pain. For chronic pain 90 tablet 0  . lisinopril (ZESTRIL) 40 MG tablet Take 1 tablet (40 mg total) by mouth daily. 90 tablet 1  . meclizine (ANTIVERT) 25 MG tablet TAKE 1 TABLET(25 MG) BY MOUTH THREE TIMES DAILY 270 tablet 0  . metFORMIN (GLUCOPHAGE) 500 MG tablet Take 1 tablet (500 mg total) by mouth 2 (two) times daily with a meal. 180 tablet 1  . omeprazole (PRILOSEC) 40 MG capsule Take 1 capsule (40 mg total) by mouth daily. 30 capsule 5  . vitamin B-12 (CYANOCOBALAMIN) 500 MCG tablet Take by mouth.     No current facility-administered medications for this visit.      : Final result   Visible to patient: No (inaccessible in MyChart)   Next appt: 01/17/2020 at 10:00 AM in Podiatry Freddie Breech(Jennifer L Galaway, DPM)   Dx: Type 2 diabetes mellitus with microal...   0 Result Notes   1 HM Topic  Component Ref Range & Units 5 mo ago  (07/17/19) 1 yr ago  (10/20/18) 1 yr ago  (04/04/18) 2 yr ago  (01/03/18) 2 yr ago  (10/12/17) 2 yr ago  (07/01/17) 2 yr ago  (03/29/17)  Hemoglobin A1C 4.0 - 5.6 % 7.3Abnormal  6.7High R, CM  6.6High R, CM  6.6Abnormal  6.5Abnormal  6.7Abnormal  7.3 R   HbA1c POC (<> result, manual entry)            HbA1c, POC (prediabetic range)          R    HbA1c, POC (controlled diabetic range)          R    Resulting Agency  [facility]            Specimen Collected: 07/17/19  09:51 Last Resulted: 07/17/19 09:51     Lab Flowsheet   Order Details   View Encounter   Lab and Collection Details   Routing   Result History     CM=Additional commentsR=Reference range differs from displayed range     Result Care Coordination   Patient Communication  Add Comments Not seen Back to Top     Satisfied Health Maintenance Topics    Back to Top HEMOGLOBIN A1C (Every 6 Months)  Next due on 01/16/2020  Address Topic        Other Results from 07/17/2019   Lipid panel Order: [id number]  Status: Final result   Visible to patient: No (inaccessible in MyChart)   Next appt: 01/17/2020 at 10:00 AM in Podiatry Freddie Breech(Jennifer L Galaway, DPM)   Dx: Type 2 diabetes mellitus with microal...   4 Result Notes   1 Follow-up Encounter  Component Ref Range & Units 5 mo ago  (07/17/19) 1 yr ago  (10/20/18) 1 yr ago  (04/04/18) 2 yr ago  (01/03/18) 2 yr ago  (07/01/17) 3 yr ago  (12/25/16) 4 yr ago  (12/24/15)  Cholesterol, Total 100 - 199 mg/dL 409175  811BJYN220High  [id number]  219High  250High  250High  224High   Triglycerides 0 - 149 mg/dL 47  67  54  43  79  95  63   HDL >39 mg/dL 87  74  82  87  72  74  72   VLDL Cholesterol Cal 5 - 40 mg/dL 10  12  11  9  16  19  13    LDL Chol Calc (NIH) 0 - 99 mg/dL 78         Chol/HDL Ratio 0.0 - 4.4 ratio 2.0  3.0 CM  2.9 CM  2.5 CM  3.5 CM  3.4 CM  3.1 R, CM   Comment:                 T. Chol/HDL Ratio                        Men Women                 1/2 Avg.Risk 3.4  3.3                   Avg.Risk 5.0  4.4                 2X Avg.Risk 9.6  7.1                 3X Avg.Risk 23.4  11.0       Allergies as of 01/03/2020  . (No Known Allergies)    Vitals: Ht 5\' 2"  (1.575 m)   Wt 210 lb (95.3 kg)   BMI 38.41 kg/m  Last Weight:  Wt Readings from Last 1 Encounters:  01/03/20 210 lb (95.3  kg)   Last Height:   Ht Readings from Last 1 Encounters:  01/03/20 5\' 2"  (1.575 m)    Physical exam:  General: The patient is awake, alert and appears not in acute distress. The patient is well groomed. Head: Normocephalic, atraumatic. Neck is supple.  Mallampati; 2  neck circumference: 15.5 " Cardiovascular:  Regular rate and rhythm, without  murmurs or carotid bruit, and without distended neck veins. Respiratory: Lungs are clear to auscultation. Skin:  Without evidence of edema, or rash Trunk: BMI is 38 kg/m2  elevated -the patient  has normal posture.  Neurologic exam : The patient is awake and alert, oriented to place and time.  Memory subjective  described as impaired. .  There is a limited  attention span & concentration ability. Speech is fluent without dysarthria, dysphonia - but there is some  aphasia. Mood and affect are anxious.   Cranial nerves: Pupils are equal and briskly reactive to light. Funduscopic exam deferred. Extraocular movements  in vertical and horizontal planes intact and without nystagmus.  Visual fields by finger perimetry are intact. Hearing to finger rub intact.  Facial sensation intact to fine touch.  Facial motor strength is symmetric / smile is symmetric- and tongue deviated to the left ( mildly)  While the uvula moved in midline.  Tongue protrusion into either cheek is normal.  Shoulder shrug is bilateral limited, she reports muscle soreness and joint stiffness.  Motor exam:   Normal tone ,muscle bulk . Weakness of hip flexion, hip adduction and retroflexion of the foot, weakness for the foot drop pronounced on the left.  She is very tender to muscle palpation - MYALGIA>  Sensory:  Fine touchand vibration were tested in all extremities.  Proprioception was normal.  Coordination: Rapid alternating movements in the fingers/hands were normal.  Finger-to-nose maneuver  normal on the right, and slowed on the left with evidence of dysmetria or tremor,  tremor was not noted on the right hand. .  Gait and station: Patient walks without assistive device and is able unassisted to climb up to the exam table. Strength within  normal limits. Stance is stable and normal.  Tandem gait is unfragmented.    Deep tendon reflexes: in the  upper and lower extremities are symmetric and intact.   Assessment:  After physical and neurologic examination, review of laboratory studies, imaging, neurophysiology testing and pre-existing records, assessment is that of :   NCD- neurocognitive disorder , mild - moderate by MMSE 22/ 30 points. There are risk factors for ministrokes, vascular dementia and also pseudodementia with depression and anxiety.    GERIATRIC DEPRESSION SCORE ENDORSED AT 10 out 15 points !!!      GAD -self reported , treated on medication that can make memory worse.   Plan:  Treatment plan and additional workup :  Dementia work up with imaging study, lab panel will be repeated for sed rate , C reactive prot, prrotein e -phoresis,  and medication adjustment.  RV in 3-4 month for repeat MMSE  , deciding then if she needs  Aricept and asking her MD to take her off opiates and benzodiazepines.  I wrote for Trazodone.   MIND diet.  Explore daytime activity with PACE.    Porfirio Mylar Wanza Szumski MD 01/03/2020

## 2020-01-03 NOTE — Patient Instructions (Addendum)
Trazodone tablets for INSOMNIA  What is this medicine? TRAZODONE (TRAZ oh done) is used to treat depression. This medicine may be used for other purposes; ask your health care provider or pharmacist if you have questions. COMMON BRAND NAME(S): Desyrel What should I tell my health care provider before I take this medicine? They need to know if you have any of these conditions:  attempted suicide or thinking about it  bipolar disorder  bleeding problems  glaucoma  heart disease, or previous heart attack  irregular heart beat  kidney or liver disease  low levels of sodium in the blood  an unusual or allergic reaction to trazodone, other medicines, foods, dyes or preservatives  pregnant or trying to get pregnant  breast-feeding How should I use this medicine? Take this medicine by mouth with a glass of water. Follow the directions on the prescription label. Take this medicine shortly after a meal or a light snack. Take your medicine at regular intervals. Do not take your medicine more often than directed. Do not stop taking this medicine suddenly except upon the advice of your doctor. Stopping this medicine too quickly may cause serious side effects or your condition may worsen. A special MedGuide will be given to you by the pharmacist with each prescription and refill. Be sure to read this information carefully each time. Talk to your pediatrician regarding the use of this medicine in children. Special care may be needed. Overdosage: If you think you have taken too much of this medicine contact a poison control center or emergency room at once. NOTE: This medicine is only for you. Do not share this medicine with others. What if I miss a dose? If you miss a dose, take it as soon as you can. If it is almost time for your next dose, take only that dose. Do not take double or extra doses. What may interact with this medicine? Do not take this medicine with any of the following  medications:  certain medicines for fungal infections like fluconazole, itraconazole, ketoconazole, posaconazole, voriconazole  cisapride  dronedarone  linezolid  MAOIs like Carbex, Eldepryl, Marplan, Nardil, and Parnate  mesoridazine  methylene blue (injected into a vein)  pimozide  saquinavir  thioridazine This medicine may also interact with the following medications:  alcohol  antiviral medicines for HIV or AIDS  aspirin and aspirin-like medicines  barbiturates like phenobarbital  certain medicines for blood pressure, heart disease, irregular heart beat  certain medicines for depression, anxiety, or psychotic disturbances  certain medicines for migraine headache like almotriptan, eletriptan, frovatriptan, naratriptan, rizatriptan, sumatriptan, zolmitriptan  certain medicines for seizures like carbamazepine and phenytoin  certain medicines for sleep  certain medicines that treat or prevent blood clots like dalteparin, enoxaparin, warfarin  digoxin  fentanyl  lithium  NSAIDS, medicines for pain and inflammation, like ibuprofen or naproxen  other medicines that prolong the QT interval (cause an abnormal heart rhythm) like dofetilide  rasagiline  supplements like St. John's wort, kava kava, valerian  tramadol  tryptophan This list may not describe all possible interactions. Give your health care provider a list of all the medicines, herbs, non-prescription drugs, or dietary supplements you use. Also tell them if you smoke, drink alcohol, or use illegal drugs. Some items may interact with your medicine. What should I watch for while using this medicine? Tell your doctor if your symptoms do not get better or if they get worse. Visit your doctor or health care professional for regular checks on your  progress. Because it may take several weeks to see the full effects of this medicine, it is important to continue your treatment as prescribed by your  doctor. Patients and their families should watch out for new or worsening thoughts of suicide or depression. Also watch out for sudden changes in feelings such as feeling anxious, agitated, panicky, irritable, hostile, aggressive, impulsive, severely restless, overly excited and hyperactive, or not being able to sleep. If this happens, especially at the beginning of treatment or after a change in dose, call your health care professional. Bonita QuinYou may get drowsy or dizzy. Do not drive, use machinery, or do anything that needs mental alertness until you know how this medicine affects you. Do not stand or sit up quickly, especially if you are an older patient. This reduces the risk of dizzy or fainting spells. Alcohol may interfere with the effect of this medicine. Avoid alcoholic drinks. This medicine may cause dry eyes and blurred vision. If you wear contact lenses you may feel some discomfort. Lubricating drops may help. See your eye doctor if the problem does not go away or is severe. Your mouth may get dry. Chewing sugarless gum, sucking hard candy and drinking plenty of water may help. Contact your doctor if the problem does not go away or is severe. What side effects may I notice from receiving this medicine? Side effects that you should report to your doctor or health care professional as soon as possible:  allergic reactions like skin rash, itching or hives, swelling of the face, lips, or tongue  elevated mood, decreased need for sleep, racing thoughts, impulsive behavior  confusion  fast, irregular heartbeat  feeling faint or lightheaded, falls  feeling agitated, angry, or irritable  loss of balance or coordination  painful or prolonged erections  restlessness, pacing, inability to keep still  suicidal thoughts or other mood changes  tremors  trouble sleeping  seizures  unusual bleeding or bruising Side effects that usually do not require medical attention (report to your doctor  or health care professional if they continue or are bothersome):  change in sex drive or performance  change in appetite or weight  constipation  headache  muscle aches or pains  nausea This list may not describe all possible side effects. Call your doctor for medical advice about side effects. You may report side effects to FDA at 1-800-FDA-1088. Where should I keep my medicine? Keep out of the reach of children. Store at room temperature between 15 and 30 degrees C (59 to 86 degrees F). Protect from light. Keep container tightly closed. Throw away any unused medicine after the expiration date. NOTE: This sheet is a summary. It may not cover all possible information. If you have questions about this medicine, talk to your doctor, pharmacist, or health care provider.  2020 Elsevier/Gold Standard (2017-12-28 11:46:46) There are well-accepted and sensible ways to reduce risk for Alzheimers disease and other degenerative brain disorders .  Exercise Daily Walk A daily 20 minute walk should be part of your routine. Disease related apathy can be a significant roadblock to exercise and the only way to overcome this is to make it a daily routine and perhaps have a reward at the end (something your loved one loves to eat or drink perhaps) or a personal trainer coming to the home can also be very useful. Most importantly, the patient is much more likely to exercise if the caregiver / spouse does it with him/her. In general a structured, repetitive schedule is  best.  General Health: Any diseases which effect your body will effect your brain such as a pneumonia, urinary infection, blood clot, heart attack or stroke. Keep contact with your primary care doctor for regular follow ups.  Sleep. A good nights sleep is healthy for the brain. Seven hours is recommended. If you have insomnia or poor sleep habits we can give you some instructions. If you have sleep apnea wear your mask.  Diet: Eating a heart  healthy diet is also a good idea; fish and poultry instead of red meat, nuts (mostly non-peanuts), vegetables, fruits, olive oil or canola oil (instead of butter), minimal salt (use other spices to flavor foods), whole grain rice, bread, cereal and pasta and wine in moderation.Research is now showing that the MIND diet, which is a combination of The Mediterranean diet and the DASH diet, is beneficial for cognitive processing and longevity. Information about this diet can be found in The MIND Diet, a book by Alonna Minium, MS, RDN, and online at [employer].es  Finances, Power of [address] and Advance Directives: You should consider putting legal safeguards in place with regard to financial and medical decision making. While the spouse always has power of attorney for medical and financial issues in the absence of any form, you should consider what you want in case the spouse / caregiver is no longer around or capable of making decisions.   The Alzheimers Association Position on Disease Prevention  Can Alzheimer's be prevented? It's a question that continues to intrigue researchers and fuel new investigations. There are no clear-cut answers yet -- partially due to the need for more large-scale studies in diverse populations -- but promising research is under way. The Alzheimer's Association is leading the worldwide effort to find a treatment for Alzheimer's, delay its onset and prevent it from developing.   What causes Alzheimer's? Experts agree that in the vast majority of cases, Alzheimer's, like other common chronic conditions, probably develops as a result of complex interactions among multiple factors, including age, genetics, environment, lifestyle and coexisting medical conditions. Although some risk factors -- such as age or genes -- cannot be changed, other risk factors -- such as high blood pressure and lack of exercise -- usually can be changed to help reduce risk.  Research in these areas may lead to new ways to detect those at highest risk.  Prevention studies A small percentage of people with Alzheimer's disease (less than 1 percent) have an early-onset type associated with genetic mutations. Individuals who have these genetic mutations are guaranteed to develop the disease. An ongoing clinical trial conducted by the [facility] ([facility]), is testing whether antibodies to beta-amyloid can reduce the accumulation of beta-amyloid plaque in the brains of people with such genetic mutations and thereby reduce, delay or prevent symptoms. Participants in the trial are receiving antibodies (or placebo) before they develop symptoms, and the development of beta-amyloid plaques is being monitored by brain scans and other tests.  Another clinical trial, known as the A4 trial (Anti-Amyloid Treatment in Asymptomatic Alzheimer's), is testing whether antibodies to beta-amyloid can reduce the risk of Alzheimer's disease in older people (ages 69 to 68) at high risk for the disease. The A4 trial is being conducted by the Alzheimer's Disease Cooperative Study.  Though research is still evolving, evidence is strong that people can reduce their risk by making key lifestyle changes, including participating in regular activity and maintaining good heart health. Based on this research, the Alzheimer's Association offers 10 Ways  to Love Your Brain -- a collection of tips that can reduce the risk of cognitive decline.  Heart-head connection  New research shows there are things we can do to reduce the risk of mild cognitive impairment and dementia.  Several conditions known to increase the risk of cardiovascular disease -- such as high blood pressure, diabetes and high cholesterol -- also increase the risk of developing Alzheimer's. Some autopsy studies show that as many as 80 percent of individuals with Alzheimer's disease also have cardiovascular disease.  A  longstanding question is why some people develop hallmark Alzheimer's plaques and tangles but do not develop the symptoms of Alzheimer's. Vascular disease may help researchers eventually find an answer. Some autopsy studies suggest that plaques and tangles may be present in the brain without causing symptoms of cognitive decline unless the brain also shows evidence of vascular disease. More research is needed to better understand the link between vascular health and Alzheimer's.  Physical exercise and diet Regular physical exercise may be a beneficial strategy to lower the risk of Alzheimer's and vascular dementia. Exercise may directly benefit brain cells by increasing blood and oxygen flow in the brain. Because of its known cardiovascular benefits, a medically approved exercise program is a valuable part of any overall wellness plan.  Current evidence suggests that heart-healthy eating may also help protect the brain. Heart-healthy eating includes limiting the intake of sugar and saturated fats and making sure to eat plenty of fruits, vegetables, and whole grains. No one diet is best. Two diets that have been studied and may be beneficial are the DASH (Dietary Approaches to Stop Hypertension) diet and the Mediterranean diet. The DASH diet emphasizes vegetables, fruits and fat-free or low-fat dairy products; includes whole grains, fish, poultry, beans, seeds, nuts and vegetable oils; and limits sodium, sweets, sugary beverages and red meats. A Mediterranean diet includes relatively little red meat and emphasizes whole grains, fruits and vegetables, fish and shellfish, and nuts, olive oil and other healthy fats.  Social connections and intellectual activity A number of studies indicate that maintaining strong social connections and keeping mentally active as we age might lower the risk of cognitive decline and Alzheimer's. Experts are not certain about the reason for this association. It may be due to direct  mechanisms through which social and mental stimulation strengthen connections between nerve cells in the brain.  Head trauma There appears to be a strong link between future risk of Alzheimer's and serious head trauma, especially when injury involves loss of consciousness. You can help reduce your risk of Alzheimer's by protecting your head. . Wear a seat belt . Use a helmet when participating in sports . "Fall-proof" your home .  What you can do now While research is not yet conclusive, certain lifestyle choices, such as physical activity and diet, may help support brain health and prevent Alzheimer's. Many of these lifestyle changes have been shown to lower the risk of other diseases, like heart disease and diabetes, which have been linked to Alzheimer's. With few drawbacks and plenty of known benefits, healthy lifestyle choices can improve your health and possibly protect your brain.  Learn more about brain health. You can help increase our knowledge by considering participation in a clinical study. Our free clinical trial matching services, TrialMatch, can help you find clinical trials in your area that are seeking volunteers.  Understanding prevention research Here are some things to keep in mind about the research underlying much of our current knowledge about possible prevention: .  Insights about potentially modifiable risk factors apply to large population groups, not to individuals. Studies can show that factor X is associated with outcome Y, but cannot guarantee that any specific person will have that outcome. As a result, you can "do everything right" and still have a serious health problem or "do everything wrong" and live to be 100. . Much of our current evidence comes from large epidemiological studies such as the [place] Aging Study, the Nurses' Health Study, the Adult Changes in Thought Study and the [employer]. These studies explore pre-existing behaviors and use  statistical methods to relate those behaviors to health outcomes. This type of study can show an "association" between a factor and an outcome but cannot "prove" cause and effect. This is why we describe evidence based on these studies with such language as "suggests," "may show," "might protect," and "is associated with." . The gold standard for showing cause and effect is a clinical trial in which participants are randomly assigned to a prevention or risk management strategy or a control group. Researchers follow the two groups over time to see if their outcomes differ significantly. . It is unlikely that some prevention or risk management strategies will ever be tested in randomized trials for ethical or practical reasons. One example is exercise. Definitively testing the impact of exercise on Alzheimer's risk would require a huge trial enrolling thousands of people and following them for many years. The expense and logistics of such a trial would be prohibitive, and it would require some people to go without exercise, a known health benefit.

## 2020-01-04 ENCOUNTER — Telehealth: Payer: Self-pay | Admitting: Neurology

## 2020-01-04 LAB — COMPREHENSIVE METABOLIC PANEL
ALT: 17 IU/L (ref 0–32)
AST: 18 IU/L (ref 0–40)
Albumin/Globulin Ratio: 1.8 (ref 1.2–2.2)
Albumin: 4.8 g/dL — ABNORMAL HIGH (ref 3.6–4.6)
Alkaline Phosphatase: 106 IU/L (ref 44–121)
BUN/Creatinine Ratio: 13 (ref 12–28)
BUN: 12 mg/dL (ref 8–27)
Bilirubin Total: 0.2 mg/dL (ref 0.0–1.2)
CO2: 26 mmol/L (ref 20–29)
Calcium: 10.2 mg/dL (ref 8.7–10.3)
Chloride: 95 mmol/L — ABNORMAL LOW (ref 96–106)
Creatinine, Ser: 0.93 mg/dL (ref 0.57–1.00)
GFR calc Af Amer: 66 mL/min/{1.73_m2} (ref >59)
GFR calc non Af Amer: 57 mL/min/{1.73_m2} — ABNORMAL LOW (ref >59)
Globulin, Total: 2.6 g/dL (ref 1.5–4.5)
Glucose: 106 mg/dL — ABNORMAL HIGH (ref 65–99)
Potassium: 4.1 mmol/L (ref 3.5–5.2)
Sodium: 139 mmol/L (ref 134–144)
Total Protein: 7.4 g/dL (ref 6.0–8.5)

## 2020-01-04 LAB — PROTEIN ELECTROPHORESIS, SERUM
A/G Ratio: 1.1 (ref 0.7–1.7)
Albumin ELP: 3.8 g/dL (ref 2.9–4.4)
Alpha 1: 0.2 g/dL (ref 0.0–0.4)
Alpha 2: 0.8 g/dL (ref 0.4–1.0)
Beta: 1.3 g/dL (ref 0.7–1.3)
Gamma Globulin: 1.3 g/dL (ref 0.4–1.8)
Globulin, Total: 3.6 g/dL (ref 2.2–3.9)

## 2020-01-04 LAB — C-REACTIVE PROTEIN: CRP: 1 mg/L (ref 0–10)

## 2020-01-04 NOTE — Progress Notes (Signed)
Normal C reactive protein level, normal plasma protein-phoresis, and mildly reduced renal clearance, non fast glucose is elevated.   All normal results.

## 2020-01-04 NOTE — Telephone Encounter (Signed)
[employer] medicare/medicaid order sent to [facility]. No auth they will reach out to the patient to schedule.  °

## 2020-01-08 ENCOUNTER — Telehealth: Payer: Self-pay | Admitting: Neurology

## 2020-01-08 NOTE — Telephone Encounter (Signed)
-----   Message from Melvyn Novas, MD sent at 01/04/2020  4:44 PM EST ----- Normal C reactive protein level, normal plasma protein-phoresis, and mildly reduced renal clearance, non fast glucose is elevated.   All normal results.

## 2020-01-08 NOTE — Telephone Encounter (Signed)
Called the pt and reviewed the lab results. Pt verbalized understanding. Pt had no questions at this time but was encouraged to call back if questions arise.

## 2020-01-17 ENCOUNTER — Ambulatory Visit (INDEPENDENT_AMBULATORY_CARE_PROVIDER_SITE_OTHER): Payer: Medicare Other | Admitting: Podiatry

## 2020-01-17 ENCOUNTER — Other Ambulatory Visit: Payer: Self-pay

## 2020-01-17 ENCOUNTER — Encounter: Payer: Self-pay | Admitting: Podiatry

## 2020-01-17 DIAGNOSIS — M79675 Pain in left toe(s): Secondary | ICD-10-CM | POA: Diagnosis not present

## 2020-01-17 DIAGNOSIS — B351 Tinea unguium: Secondary | ICD-10-CM

## 2020-01-17 DIAGNOSIS — M79674 Pain in right toe(s): Secondary | ICD-10-CM | POA: Diagnosis not present

## 2020-01-17 DIAGNOSIS — E1142 Type 2 diabetes mellitus with diabetic polyneuropathy: Secondary | ICD-10-CM

## 2020-01-19 NOTE — Progress Notes (Signed)
Subjective: Casey [name] presents today for follow up of preventative diabetic foot care and painful mycotic nails b/l that are difficult to trim. Pain interferes with ambulation. Aggravating factors include wearing enclosed shoe gear. Pain is relieved with periodic professional debridement.   She voices no new pedal concerns on today's visit. Patient states she does not check her blood glucose regularly.  PCP is Dr. Koren Shiver. Last visit was 10/19/2019.  No Known Allergies   Objective: There were no vitals filed for this visit.  Pt 84 y.o. year old African American female  in NAD. AAO x 3.   Vascular Examination:  Capillary refill time to digits immediate b/l. Palpable DP pulses b/l. Palpable PT pulses b/l. Pedal hair absent b/l Skin temperature gradient within normal limits b/l.  Dermatological Examination: Pedal skin with normal turgor, texture and tone bilaterally. No open wounds bilaterally. No interdigital macerations bilaterally. Toenails 1-5 b/l elongated, dystrophic, thickened, crumbly with subungual debris and tenderness to dorsal palpation.  Musculoskeletal: Normal muscle strength 5/5 to all lower extremity muscle groups bilaterally, no pain crepitus or joint limitation noted with ROM b/l and pes planus deformity noted  Neurological: Protective sensation diminished with 10g monofilament b/l.  Assessment: 1. Pain due to onychomycosis of toenails of both feet   2. Diabetic peripheral neuropathy associated with type 2 diabetes mellitus (HCC)    Plan: -Examined patient. -No new findings. No new orders. -Continue diabetic foot care principles. -Toenails 1-5 b/l were debrided in length and girth with sterile nail nippers and dremel without iatrogenic bleeding.  -Patient to report any pedal injuries to medical professional immediately. -Patient to continue soft, supportive shoe gear daily. -Patient/POA to call should there be question/concern in the interim.  Return  in about 3 months (around 04/16/2020).

## 2020-01-22 ENCOUNTER — Encounter: Payer: Self-pay | Admitting: Registered Nurse

## 2020-01-22 ENCOUNTER — Ambulatory Visit (INDEPENDENT_AMBULATORY_CARE_PROVIDER_SITE_OTHER): Payer: Medicare Other | Admitting: Registered Nurse

## 2020-01-22 ENCOUNTER — Other Ambulatory Visit: Payer: Self-pay

## 2020-01-22 VITALS — BP 145/78 | HR 81 | Temp 97.2°F | Resp 18 | Ht 62.0 in | Wt 208.8 lb

## 2020-01-22 DIAGNOSIS — G8929 Other chronic pain: Secondary | ICD-10-CM | POA: Diagnosis not present

## 2020-01-22 DIAGNOSIS — R809 Proteinuria, unspecified: Secondary | ICD-10-CM | POA: Diagnosis not present

## 2020-01-22 DIAGNOSIS — E1129 Type 2 diabetes mellitus with other diabetic kidney complication: Secondary | ICD-10-CM | POA: Diagnosis not present

## 2020-01-22 DIAGNOSIS — K219 Gastro-esophageal reflux disease without esophagitis: Secondary | ICD-10-CM

## 2020-01-22 DIAGNOSIS — H8110 Benign paroxysmal vertigo, unspecified ear: Secondary | ICD-10-CM | POA: Diagnosis not present

## 2020-01-22 LAB — POCT GLYCOSYLATED HEMOGLOBIN (HGB A1C): Hemoglobin A1C: 7.4 % — AB (ref 4.0–5.6)

## 2020-01-22 MED ORDER — MECLIZINE HCL 25 MG PO TABS: ORAL_TABLET | ORAL | 0 refills | Status: DC

## 2020-01-22 MED ORDER — OMEPRAZOLE 40 MG PO CPDR: 40.0000 mg | DELAYED_RELEASE_CAPSULE | Freq: Every day | ORAL | 5 refills | Status: DC

## 2020-01-22 NOTE — Progress Notes (Signed)
Established Patient Office Visit  Subjective:  Patient ID: Casey [name], female    DOB: 07/16/1935  Age: 85 y.o. MRN: [id number]  CC:  Chief Complaint  Patient presents with  . Follow-up    Patient states she is here for her follow up. Per patient states she is has still been having some memory loss. Patient states she went to the urologist and she got a call and said the blood work was perfect.    HPI Casey [name] presents for follow up on memory deficit  Was able to see Dr. Brett Fairy who has assumed care for this - dr. Brett Fairy has taken her off of alprazolam and switched her to trazodone, which pt says is ok for sleep but she is still anxious. Requesting medication for anxiety. Has near panic attacks with otherwise benign tasks like driving.   She is due for a1c check and med refills - both will be collected. Last a1c was 6 mo ago result at 7.3. no significant changes since then. No symptoms of concern. Due for eye exam - will place  She has been on long term opioids for chronic pain. They are effective but her pain is worse since stopping the alprazolam. She would see benefit from a cognitive standpoint by decreasing or stopping opioids but to taper safely while continuing to manage her pain is a big ask - she is amenable to a pain management referral.  Otherwise doing well. In good spirits today. No new concerns.   Past Medical History:  Diagnosis Date  . ALLERGIC RHINITIS   . Allergy   . ANXIETY   . ARM PAIN, RIGHT   . BENIGN POSITIONAL VERTIGO   . Carcinoma in situ of colon   . CONSTIPATION   . CYSTOCELE WITH INCOMPLETE UTERINE PROLAPSE   . Diabetes mellitus without complication ([place])   . ESOPHAGEAL STRICTURE   . FATIGUE   . FLANK PAIN, LEFT   . Headache(784.0)   . HIP PAIN, LEFT   . HYPERCHOLESTEROLEMIA   . HYPERGLYCEMIA   . HYPERTENSION, BENIGN ESSENTIAL 03/03/2007  . LEG EDEMA, BILATERAL   . Microscopic hematuria   . OBESITY   . OSTEOARTHRITIS, KNEE   .  OSTEOPOROSIS   . Peripheral neuropathy, idiopathic   . Problems with swallowing and mastication   . SINUSITIS, RECURRENT   . SPRAIN&STRAIN OTH SPEC SITES SHOULDER&UPPER ARM     Past Surgical History:  Procedure Laterality Date  . LEFT HEART CATHETERIZATION WITH CORONARY ANGIOGRAM N/A 12/02/2010   Procedure: LEFT HEART CATHETERIZATION WITH CORONARY ANGIOGRAM;  Surgeon: Peter M Martinique, MD;  Location: [facility] CATH LAB;  Service: Cardiovascular;:: NORMAL CORONARY ARTERIES - NORMAL LV FUNCTION & EDP.    Marland Kitchen TRANSTHORACIC ECHOCARDIOGRAM  11/2010   Normal EF 60-65%. No RWMA.  Mild LAD dilation c/w Gr 1 DD.  -- PRETTY NORMAL ECHO    Family History  Problem Relation Age of Onset  . Coronary artery disease Mother 29  . Cancer Father 60  . Heart disease Father   . Colon cancer Other   . Diabetes Other   . Coronary artery disease Sister   . Diabetes type II Sister   . Cancer Sister   . Coronary artery disease Brother   . Kidney disease Brother   . Coronary artery disease Brother   . Breast cancer Neg Hx     Social History   Socioeconomic History  . Marital status: Single    Spouse name: Not on file  .  Number of children: 1  . Years of education: 8  . Highest education level: High school graduate  Occupational History  . Not on file  Tobacco Use  . Smoking status: Never Smoker  . Smokeless tobacco: Never Used  Vaping Use  . Vaping Use: Never used  Substance and Sexual Activity  . Alcohol use: No  . Drug use: No  . Sexual activity: Not Currently  Other Topics Concern  . Not on file  Social History Narrative   She tries to stay active, but over the last year (20 19-20 20), she has been very limited by her right knee pain.   Is able to go grocery shopping.   Social Determinants of Health   Financial Resource Strain: Not on file  Food Insecurity: Not on file  Transportation Needs: Not on file  Physical Activity: Not on file  Stress: Not on file  Social Connections: Not on file   Intimate Partner Violence: Not on file    Outpatient Medications Prior to Visit  Medication Sig Dispense Refill  . amLODipine (NORVASC) 5 MG tablet TAKE 1 TABLET(5 MG) BY MOUTH DAILY 90 tablet 1  . aspirin EC 81 MG tablet Take 1 tablet (81 mg total) by mouth daily.    Marland Kitchen atorvastatin (LIPITOR) 10 MG tablet Take 1 tablet (10 mg total) by mouth daily. 90 tablet 3  . furosemide (LASIX) 40 MG tablet Take 1 tablet (40 mg total) by mouth daily. 90 tablet 1  . gabapentin (NEURONTIN) 600 MG tablet TAKE 1 TABLET(600 MG) BY MOUTH THREE TIMES DAILY 270 tablet 2  . HYDROcodone-acetaminophen (NORCO/VICODIN) 5-325 MG tablet Take 1 tablet by mouth every 6 (six) hours as needed for moderate pain. For chronic pain 90 tablet 0  . lisinopril (ZESTRIL) 40 MG tablet Take 1 tablet (40 mg total) by mouth daily. 90 tablet 1  . metFORMIN (GLUCOPHAGE) 500 MG tablet Take 1 tablet (500 mg total) by mouth 2 (two) times daily with a meal. 180 tablet 1  . traZODone (DESYREL) 50 MG tablet Take 1 tablet (50 mg total) by mouth at bedtime. 30 tablet 5  . vitamin B-12 (CYANOCOBALAMIN) 500 MCG tablet Take by mouth.    . meclizine (ANTIVERT) 25 MG tablet TAKE 1 TABLET(25 MG) BY MOUTH THREE TIMES DAILY 270 tablet 0  . omeprazole (PRILOSEC) 40 MG capsule Take 1 capsule (40 mg total) by mouth daily. 30 capsule 5  . ALPRAZolam (XANAX) 0.25 MG tablet TAKE 1 TABLET(0.25 MG) BY MOUTH AT BEDTIME AS NEEDED FOR ANXIETY (Patient not taking: Reported on 01/22/2020) 30 tablet 3   No facility-administered medications prior to visit.    No Known Allergies  ROS Review of Systems Per hpi     Objective:    Physical Exam Vitals and nursing note reviewed.  Constitutional:      General: She is not in acute distress.    Appearance: Normal appearance. She is normal weight. She is not ill-appearing, toxic-appearing or diaphoretic.  Cardiovascular:     Rate and Rhythm: Normal rate and regular rhythm.     Heart sounds: Normal heart sounds. No  murmur heard. No friction rub. No gallop.   Pulmonary:     Effort: Pulmonary effort is normal. No respiratory distress.     Breath sounds: Normal breath sounds. No stridor. No wheezing, rhonchi or rales.  Chest:     Chest wall: No tenderness.  Skin:    General: Skin is warm and dry.  Neurological:  General: No focal deficit present.     Mental Status: She is alert and oriented to person, place, and time. Mental status is at baseline.  Psychiatric:        Mood and Affect: Mood normal.        Behavior: Behavior normal.        Thought Content: Thought content normal.        Judgment: Judgment normal.     BP (!) 145/78   Pulse 81   Temp (!) 97.2 F (36.2 C) (Temporal)   Resp 18   Ht 5\' 2"  (1.575 m)   Wt 208 lb 12.8 oz (94.7 kg)   SpO2 99%   BMI 38.19 kg/m  Wt Readings from Last 3 Encounters:  01/22/20 208 lb 12.8 oz (94.7 kg)  01/03/20 210 lb (95.3 kg)  11/17/19 207 lb 6.4 oz (94.1 kg)     Health Maintenance Due  Topic Date Due  . OPHTHALMOLOGY EXAM  11/24/2019    There are no preventive care reminders to display for this patient.  Lab Results  Component Value Date   TSH 1.300 07/17/2019   Lab Results  Component Value Date   WBC 6.5 10/20/2018   HGB 11.2 10/20/2018   HCT 35.1 10/20/2018   MCV 84 10/20/2018   PLT 336 10/20/2018   Lab Results  Component Value Date   NA 139 01/03/2020   K 4.1 01/03/2020   CO2 26 01/03/2020   GLUCOSE 106 (H) 01/03/2020   BUN 12 01/03/2020   CREATININE 0.93 01/03/2020   BILITOT 0.2 01/03/2020   ALKPHOS 106 01/03/2020   AST 18 01/03/2020   ALT 17 01/03/2020   PROT 7.4 01/03/2020   ALBUMIN 4.8 (H) 01/03/2020   CALCIUM 10.2 01/03/2020   Lab Results  Component Value Date   CHOL 175 07/17/2019   Lab Results  Component Value Date   HDL 87 07/17/2019   Lab Results  Component Value Date   LDLCALC 78 07/17/2019   Lab Results  Component Value Date   TRIG 47 07/17/2019   Lab Results  Component Value Date    CHOLHDL 2.0 07/17/2019   Lab Results  Component Value Date   HGBA1C 7.4 (A) 01/22/2020      Assessment & Plan:   Problem List Items Addressed This Visit      Digestive   Gastroesophageal reflux disease without esophagitis   Relevant Medications   meclizine (ANTIVERT) 25 MG tablet   omeprazole (PRILOSEC) 40 MG capsule     Endocrine   Type 2 diabetes mellitus with microalbuminuria, without long-term current use of insulin (HCC) - Primary (Chronic)   Relevant Orders   Ambulatory referral to Ophthalmology   POCT glycosylated hemoglobin (Hb A1C) (Completed)     Nervous and Auditory   BENIGN POSITIONAL VERTIGO   Relevant Medications   meclizine (ANTIVERT) 25 MG tablet      Meds ordered this encounter  Medications  . meclizine (ANTIVERT) 25 MG tablet    Sig: TAKE 1 TABLET(25 MG) BY MOUTH THREE TIMES DAILY    Dispense:  270 tablet    Refill:  0  . omeprazole (PRILOSEC) 40 MG capsule    Sig: Take 1 capsule (40 mg total) by mouth daily.    Dispense:  30 capsule    Refill:  5    Follow-up: Return in about 3 months (around 04/21/2020) for chronic conditions.   PLAN  Refer to eye doc for t2dm eye exam  Refer to pain clinic  Refill meclizine  Refill omeprazole  a1c about steady today at 7.4 - permissible given age and other health factors being addressed.  Patient encouraged to call clinic with any questions, comments, or concerns.  [name], NP

## 2020-01-22 NOTE — Patient Instructions (Signed)
° ° ° °  If you have lab work done today you will be contacted with your lab results within the next 2 weeks.  If you have not heard from us then please contact us. The fastest way to get your results is to register for My Chart. ° ° °IF you received an x-ray today, you will receive an invoice from [facility]. Please contact [facility] at [phone] with questions or concerns regarding your invoice.  ° °IF you received labwork today, you will receive an invoice from [facility]. Please contact [facility] at [phone] with questions or concerns regarding your invoice.  ° °Our billing staff will not be able to assist you with questions regarding bills from these companies. ° °You will be contacted with the lab results as soon as they are available. The fastest way to get your results is to activate your My Chart account. Instructions are located on the last page of this paperwork. If you have not heard from us regarding the results in 2 weeks, please contact this office. °  ° ° ° °

## 2020-01-24 ENCOUNTER — Telehealth: Payer: Self-pay | Admitting: Registered Nurse

## 2020-01-24 NOTE — Telephone Encounter (Signed)
01/24/2020 - PATIENT STATES SHE SAW RICH MORROW ON Monday 01/22/2020. HE WAS GOING TO CALL HER SOMETHING IN FOR HER NERVES. WHEN SHE GOT TO THE PHARMACY THEY TOLD HER NOTHING HAD BEEN SENT IN. SHE WOULD LIKE THAT DONE PLEASE. BEST PHONE [phone]   PHARMACY CHOICE IS: [facility] ON [address]. MBC

## 2020-01-25 NOTE — Telephone Encounter (Signed)
Pt called in stating her medication for her nerves that she was told would be sent into her pharmacy wasn't. Please advise

## 2020-01-26 NOTE — Telephone Encounter (Signed)
Please send rx

## 2020-01-26 NOTE — Telephone Encounter (Signed)
Patient called in to inquire of [name] about the medication he told her he would send to the pharmacy for her nerves. She states that her nerves are bad and she need this medication ASAP. Also need a call when sent to the pharmacy Ph# [phone]

## 2020-01-29 ENCOUNTER — Other Ambulatory Visit: Payer: Self-pay | Admitting: Registered Nurse

## 2020-01-29 DIAGNOSIS — F411 Generalized anxiety disorder: Secondary | ICD-10-CM

## 2020-01-29 MED ORDER — HYDROXYZINE HCL 10 MG PO TABS: 10.0000 mg | ORAL_TABLET | Freq: Three times a day (TID) | ORAL | 0 refills | Status: DC | PRN

## 2020-01-30 ENCOUNTER — Other Ambulatory Visit: Payer: Self-pay | Admitting: Registered Nurse

## 2020-01-30 DIAGNOSIS — M170 Bilateral primary osteoarthritis of knee: Secondary | ICD-10-CM

## 2020-01-30 NOTE — Telephone Encounter (Signed)
Copied from CRM [id number]. Topic: Quick Communication - Rx Refill/Question >> Jan 30, 2020 10:05 AM Jaquita Rector A wrote: Medication: HYDROcodone-acetaminophen (NORCO/VICODIN) 5-325 MG tablet   Has the patient contacted their pharmacy? Yes.   (Agent: If no, request that the patient contact the pharmacy for the refill.) (Agent: If yes, when and what did the pharmacy advise?)  Preferred Pharmacy (with phone number or street name): [facility] [phone] - [place], [place] - [address]  Phone:  [phone] Fax:  [phone]     Agent: Please be advised that RX refills may take up to 3 business days. We ask that you follow-up with your pharmacy.

## 2020-01-30 NOTE — Telephone Encounter (Signed)
Requested medication (s) are due for refill today: yes  Requested medication (s) are on the active medication list:  yes  Last refill: 12/28/2019  Future visit scheduled: yes  Notes to clinic:  this refill cannot be delegated    Requested Prescriptions  Pending Prescriptions Disp Refills   HYDROcodone-acetaminophen (NORCO/VICODIN) 5-325 MG tablet 90 tablet 0    Sig: Take 1 tablet by mouth every 6 (six) hours as needed for moderate pain. For chronic pain      Not Delegated - Analgesics:  Opioid Agonist Combinations Failed - 01/30/2020 10:13 AM      Failed - This refill cannot be delegated      Failed - Urine Drug Screen completed in last 360 days      Passed - Valid encounter within last 6 months    Recent Outpatient Visits           1 week ago Type 2 diabetes mellitus with microalbuminuria, without long-term current use of insulin (HCC)   [facility], Gerlene Burdock, NP   2 months ago GAD (generalized anxiety disorder)   [facility], Gerlene Burdock, NP   3 months ago Medicare annual wellness visit, subsequent   Primary Care at [facility], Meda Coffee, MD   3 months ago HYPERTENSION, BENIGN ESSENTIAL   Primary Care at [facility], Meda Coffee, MD   6 months ago Type 2 diabetes mellitus with microalbuminuria, without long-term current use of insulin [facility])   Primary Care at [facility], Meda Coffee, MD       Future Appointments             In 2 months [name], NP Primary Care at [place], [facility]

## 2020-01-31 MED ORDER — HYDROCODONE-ACETAMINOPHEN 5-325 MG PO TABS: 1.0000 | ORAL_TABLET | Freq: Four times a day (QID) | ORAL | 0 refills | Status: DC | PRN

## 2020-03-04 ENCOUNTER — Other Ambulatory Visit: Payer: Self-pay | Admitting: Registered Nurse

## 2020-03-04 DIAGNOSIS — M170 Bilateral primary osteoarthritis of knee: Secondary | ICD-10-CM

## 2020-03-04 MED ORDER — HYDROCODONE-ACETAMINOPHEN 5-325 MG PO TABS: 1.0000 | ORAL_TABLET | Freq: Four times a day (QID) | ORAL | 0 refills | Status: DC | PRN

## 2020-03-04 NOTE — Telephone Encounter (Signed)
Medication Refill - Medication: hydrocodone  Has the patient contacted their pharmacy? Yes pt was told to call office due to controlled substance  Preferred Pharmacy (with phone number or street name[facility] [address] in [place] phone number [phone] Agent: Please be advised that RX refills may take up to 3 business days. We ask that you follow-up with your pharmacy.

## 2020-03-04 NOTE — Telephone Encounter (Signed)
Requested medication (s) are due for refill today: Yes  Requested medication (s) are on the active medication list: Yes  Last refill:  01/31/20  Future visit scheduled: Yes  Notes to clinic:  See request    Requested Prescriptions  Pending Prescriptions Disp Refills   HYDROcodone-acetaminophen (NORCO/VICODIN) 5-325 MG tablet 90 tablet 0    Sig: Take 1 tablet by mouth every 6 (six) hours as needed for moderate pain. For chronic pain      Not Delegated - Analgesics:  Opioid Agonist Combinations Failed - 03/04/2020  1:38 PM      Failed - This refill cannot be delegated      Failed - Urine Drug Screen completed in last 360 days      Passed - Valid encounter within last 6 months    Recent Outpatient Visits           1 month ago Type 2 diabetes mellitus with microalbuminuria, without long-term current use of insulin (HCC)   [facility] at [facility], Gerlene Burdock, NP   3 months ago GAD (generalized anxiety disorder)   [facility] at [facility], Gerlene Burdock, NP   4 months ago Medicare annual wellness visit, subsequent   Primary Care at [facility], Meda Coffee, MD   4 months ago HYPERTENSION, BENIGN ESSENTIAL   Primary Care at [facility], Meda Coffee, MD   7 months ago Type 2 diabetes mellitus with microalbuminuria, without long-term current use of insulin [facility])   Primary Care at [facility], Meda Coffee, MD       Future Appointments             In 1 month [name], NP Primary Care at [place], [facility]

## 2020-03-07 ENCOUNTER — Other Ambulatory Visit: Payer: Self-pay | Admitting: Registered Nurse

## 2020-03-07 DIAGNOSIS — F411 Generalized anxiety disorder: Secondary | ICD-10-CM

## 2020-03-28 ENCOUNTER — Other Ambulatory Visit: Payer: Self-pay | Admitting: Registered Nurse

## 2020-03-28 DIAGNOSIS — I10 Essential (primary) hypertension: Secondary | ICD-10-CM

## 2020-03-28 MED ORDER — LISINOPRIL 40 MG PO TABS: 40.0000 mg | ORAL_TABLET | Freq: Every day | ORAL | 0 refills | Status: DC

## 2020-03-28 MED ORDER — AMLODIPINE BESYLATE 5 MG PO TABS: ORAL_TABLET | ORAL | 0 refills | Status: DC

## 2020-03-28 NOTE — Telephone Encounter (Signed)
No future visit at this time  

## 2020-03-28 NOTE — Telephone Encounter (Signed)
Copied from CRM [id number]. Topic: Quick Communication - Rx Refill/Question >> Mar 28, 2020  4:16 PM Izora Ribas, Everette A wrote: Medication: amLODipine (NORVASC) 5 MG tablet  lisinopril (ZESTRIL) 40 MG tablet Patient shares that they have 0(zero) pills left of either medication  Has the patient contacted their pharmacy? Yes. Patient contacted pharmacy and they were unhelpful. Patient was directed, by pharmacy, to contact PCP  Preferred Pharmacy (with phone number or street name): [facility] [phone] - [place], [place] - [address] AT [address]  Phone:  [phone]  Agent: Please be advised that RX refills may take up to 3 business days. We ask that you follow-up with your pharmacy.

## 2020-04-01 ENCOUNTER — Emergency Department (HOSPITAL_COMMUNITY): Payer: Medicare Other

## 2020-04-01 ENCOUNTER — Other Ambulatory Visit: Payer: Self-pay

## 2020-04-01 ENCOUNTER — Emergency Department (HOSPITAL_COMMUNITY)
Admission: EM | Admit: 2020-04-01 | Discharge: 2020-04-02 | Disposition: A | Discharge status: Home / Self Care | Payer: Medicare Other | Attending: Emergency Medicine | Admitting: Emergency Medicine

## 2020-04-01 ENCOUNTER — Encounter (HOSPITAL_COMMUNITY): Payer: Self-pay | Admitting: Emergency Medicine

## 2020-04-01 DIAGNOSIS — S8001XA Contusion of right knee, initial encounter: Secondary | ICD-10-CM | POA: Diagnosis not present

## 2020-04-01 DIAGNOSIS — F0390 Unspecified dementia without behavioral disturbance: Secondary | ICD-10-CM | POA: Insufficient documentation

## 2020-04-01 DIAGNOSIS — R609 Edema, unspecified: Secondary | ICD-10-CM | POA: Diagnosis not present

## 2020-04-01 DIAGNOSIS — E119 Type 2 diabetes mellitus without complications: Secondary | ICD-10-CM | POA: Diagnosis not present

## 2020-04-01 DIAGNOSIS — Z7982 Long term (current) use of aspirin: Secondary | ICD-10-CM | POA: Insufficient documentation

## 2020-04-01 DIAGNOSIS — S8991XA Unspecified injury of right lower leg, initial encounter: Secondary | ICD-10-CM | POA: Diagnosis present

## 2020-04-01 DIAGNOSIS — M25561 Pain in right knee: Secondary | ICD-10-CM

## 2020-04-01 DIAGNOSIS — Z7984 Long term (current) use of oral hypoglycemic drugs: Secondary | ICD-10-CM | POA: Diagnosis not present

## 2020-04-01 DIAGNOSIS — Y92009 Unspecified place in unspecified non-institutional (private) residence as the place of occurrence of the external cause: Secondary | ICD-10-CM | POA: Insufficient documentation

## 2020-04-01 DIAGNOSIS — Z79899 Other long term (current) drug therapy: Secondary | ICD-10-CM | POA: Diagnosis not present

## 2020-04-01 DIAGNOSIS — I10 Essential (primary) hypertension: Secondary | ICD-10-CM | POA: Diagnosis not present

## 2020-04-01 DIAGNOSIS — W1839XA Other fall on same level, initial encounter: Secondary | ICD-10-CM | POA: Insufficient documentation

## 2020-04-01 NOTE — ED Triage Notes (Signed)
Patient lost her balance and fell at home last week , denies injury/ambulatory , reports pain at right knee with mild swelling .

## 2020-04-02 DIAGNOSIS — M25561 Pain in right knee: Secondary | ICD-10-CM | POA: Diagnosis not present

## 2020-04-02 DIAGNOSIS — S8001XA Contusion of right knee, initial encounter: Secondary | ICD-10-CM | POA: Diagnosis not present

## 2020-04-02 NOTE — ED Provider Notes (Signed)
[facility]   CSN: [id number] Arrival date & time: 04/01/20  2009     History Chief Complaint  Patient presents with  . Fall/Knee Injury    Casey [name] is a 85 y.o. female.  Patient presents to the emergency department with a chief complaint of right knee pain.  She states that she had a fall several days ago.  She reports persistent pain in the right knee.  She has been ambulatory.  She states that she has been using a walker.  She reports that last year she was working to get scheduled for TKA, but her orthopedic surgeon never called her back.  She is interested in pursuing this.  She also reports that she has had some swelling on her legs for quite some time.  She has difficulty getting the swelling out of her legs.  She denies any history of CHF.  Denies shortness of breath.  States that at 1 point she did have the swelling evaluated for DVT, but this was negative.  The history is provided by the patient. No language interpreter was used.       Past Medical History:  Diagnosis Date  . ALLERGIC RHINITIS   . Allergy   . ANXIETY   . ARM PAIN, RIGHT   . BENIGN POSITIONAL VERTIGO   . Carcinoma in situ of colon   . CONSTIPATION   . CYSTOCELE WITH INCOMPLETE UTERINE PROLAPSE   . Diabetes mellitus without complication (HCC)   . ESOPHAGEAL STRICTURE   . FATIGUE   . FLANK PAIN, LEFT   . Headache(784.0)   . HIP PAIN, LEFT   . HYPERCHOLESTEROLEMIA   . HYPERGLYCEMIA   . HYPERTENSION, BENIGN ESSENTIAL 03/03/2007  . LEG EDEMA, BILATERAL   . Microscopic hematuria   . OBESITY   . OSTEOARTHRITIS, KNEE   . OSTEOPOROSIS   . Peripheral neuropathy, idiopathic   . Problems with swallowing and mastication   . SINUSITIS, RECURRENT   . SPRAIN&STRAIN OTH SPEC SITES SHOULDER&UPPER ARM     Patient Active Problem List   Diagnosis Date Noted  . Cognitive deficit with impaired visuospatial function 01/03/2020  . Memory impairment of  gradual onset 01/03/2020  . Anxiety about health 01/03/2020  . Dementia without behavioral disturbance (HCC) 01/03/2020  . Gastroesophageal reflux disease without esophagitis 07/17/2019  . Preoperative cardiovascular examination 05/13/2018  . Vitamin B12 deficiency 07/14/2017  . Primary osteoarthritis of left knee 07/07/2013  . Type 2 diabetes mellitus with microalbuminuria, without long-term current use of insulin (HCC) 05/26/2012  . Anemia 10/23/2011  . Encounter for medication monitoring 10/23/2011  . Hyponatremia 12/01/2010    Class: Acute  . Chest pain with moderate risk for cardiac etiology 11/26/2010    Class: Acute  . CYSTOCELE WITH INCOMPLETE UTERINE PROLAPSE 03/10/2010  . HYPERGLYCEMIA 02/06/2010  . ESOPHAGEAL STRICTURE 12/11/2009  . Obesity 12/09/2009  . PROBLEMS WITH SWALLOWING AND MASTICATION 12/09/2009  . MICROSCOPIC HEMATURIA 07/29/2009  . ARM PAIN, RIGHT 03/28/2009  . Fatigue 03/28/2009  . CONSTIPATION 09/25/2008  . Sinusitis, chronic 02/23/2008  . Osteoarthrosis involving lower leg 09/28/2007  . LEG EDEMA, BILATERAL 08/31/2007  . FLANK PAIN, LEFT 06/08/2007  . SPRAIN&STRAIN OTH SPEC SITES SHOULDER&UPPER ARM 06/08/2007  . BENIGN POSITIONAL VERTIGO 04/25/2007  . Headache 03/24/2007  . HYPERCHOLESTEROLEMIA 03/03/2007  . GAD (generalized anxiety disorder) 03/03/2007  . HYPERTENSION, BENIGN ESSENTIAL 03/03/2007  . Allergic rhinitis 03/03/2007  . HIP PAIN, LEFT 03/03/2007  . Osteoporosis 03/03/2007  Past Surgical History:  Procedure Laterality Date  . LEFT HEART CATHETERIZATION WITH CORONARY ANGIOGRAM N/A 12/02/2010   Procedure: LEFT HEART CATHETERIZATION WITH CORONARY ANGIOGRAM;  Surgeon: Peter M [place], MD;  Location: [facility] CATH LAB;  Service: Cardiovascular;:: NORMAL CORONARY ARTERIES - NORMAL LV FUNCTION & EDP.    Marland Kitchen TRANSTHORACIC ECHOCARDIOGRAM  11/2010   Normal EF 60-65%. No RWMA.  Mild LAD dilation c/w Gr 1 DD.  -- PRETTY NORMAL ECHO     OB History   No  obstetric history on file.     Family History  Problem Relation Age of Onset  . Coronary artery disease Mother 25  . Cancer Father 63  . Heart disease Father   . Colon cancer Other   . Diabetes Other   . Coronary artery disease Sister   . Diabetes type II Sister   . Cancer Sister   . Coronary artery disease Brother   . Kidney disease Brother   . Coronary artery disease Brother   . Breast cancer Neg Hx     Social History   Tobacco Use  . Smoking status: Never Smoker  . Smokeless tobacco: Never Used  Vaping Use  . Vaping Use: Never used  Substance Use Topics  . Alcohol use: No  . Drug use: No    Home Medications Prior to Admission medications   Medication Sig Start Date End Date Taking? Authorizing Provider  ALPRAZolam (XANAX) 0.25 MG tablet TAKE 1 TABLET(0.25 MG) BY MOUTH AT BEDTIME AS NEEDED FOR ANXIETY Patient not taking: Reported on 01/22/2020 09/01/19   Lezlie Lye, Meda Coffee, MD  amLODipine (NORVASC) 5 MG tablet TAKE 1 TABLET(5 MG) BY MOUTH DAILY 03/28/20   [name], NP  aspirin EC 81 MG tablet Take 1 tablet (81 mg total) by mouth daily. 08/21/15   Sherren Mocha, MD  atorvastatin (LIPITOR) 10 MG tablet Take 1 tablet (10 mg total) by mouth daily. 01/23/19   Noni Saupe, MD  furosemide (LASIX) 40 MG tablet Take 1 tablet (40 mg total) by mouth daily. 11/17/19   [name], NP  gabapentin (NEURONTIN) 600 MG tablet TAKE 1 TABLET(600 MG) BY MOUTH THREE TIMES DAILY 09/04/19   Lezlie Lye, Meda Coffee, MD  HYDROcodone-acetaminophen (NORCO/VICODIN) 5-325 MG tablet Take 1 tablet by mouth every 6 (six) hours as needed for moderate pain. For chronic pain 03/04/20   [name], NP  hydrOXYzine (ATARAX/VISTARIL) 10 MG tablet TAKE 1 TABLET(10 MG) BY MOUTH THREE TIMES DAILY AS NEEDED 03/07/20   [name], NP  lisinopril (ZESTRIL) 40 MG tablet Take 1 tablet (40 mg total) by mouth daily. 03/28/20   [name], NP  meclizine (ANTIVERT) 25 MG tablet TAKE 1 TABLET(25  MG) BY MOUTH THREE TIMES DAILY 01/22/20   [name], NP  metFORMIN (GLUCOPHAGE) 500 MG tablet Take 1 tablet (500 mg total) by mouth 2 (two) times daily with a meal. 11/17/19   [name], NP  omeprazole (PRILOSEC) 40 MG capsule Take 1 capsule (40 mg total) by mouth daily. 01/22/20   [name], NP  traZODone (DESYREL) 50 MG tablet Take 1 tablet (50 mg total) by mouth at bedtime. 01/03/20   Dohmeier, Porfirio Mylar, MD  vitamin B-12 (CYANOCOBALAMIN) 500 MCG tablet Take by mouth.    [provider]    Allergies    Patient has no known allergies.  Review of Systems   Review of Systems  All other systems reviewed and are negative.   Physical Exam Updated Vital Signs BP Marland Kitchen)  182/83 (BP Location: Right Arm)   Pulse 94   Temp 98.4 F (36.9 C) (Oral)   Resp 18   Ht 5\' 2"  (1.575 m)   Wt 110 kg   SpO2 95%   BMI 44.35 kg/m   Physical Exam Vitals and nursing note reviewed.  Constitutional:      General: She is not in acute distress.    Appearance: She is well-developed.  HENT:     Head: Normocephalic and atraumatic.  Eyes:     Conjunctiva/sclera: Conjunctivae normal.  Cardiovascular:     Rate and Rhythm: Normal rate and regular rhythm.     Heart sounds: No murmur heard.   Pulmonary:     Effort: Pulmonary effort is normal. No respiratory distress.     Breath sounds: Normal breath sounds.  Abdominal:     Palpations: Abdomen is soft.     Tenderness: There is no abdominal tenderness.  Musculoskeletal:     Cervical back: Neck supple.     Comments: Mild edema in bilateral lower extremities Right know without bony deformity or abnormality  Skin:    General: Skin is warm and dry.     Comments: Minor contusion to medial right knee  Neurological:     Mental Status: She is alert and oriented to person, place, and time.  Psychiatric:        Mood and Affect: Mood normal.        Behavior: Behavior normal.     ED Results / Procedures / Treatments   Labs (all labs  ordered are listed, but only abnormal results are displayed) Labs Reviewed - No data to display  EKG None  Radiology DG Knee Complete 4 Views Right  Result Date: 04/01/2020 CLINICAL DATA:  04/03/2020, right knee pain EXAM: RIGHT KNEE - COMPLETE 4+ VIEW COMPARISON:  01/17/2018 FINDINGS: Frontal, bilateral oblique, lateral views of the right knee are obtained. There are stable severe 3 compartmental osteoarthritic changes, most pronounced in the lateral compartment with joint space narrowing and abundant osteophyte formation. Sclerosis in the lateral tibial plateau likely reflects sequela of prior healed fracture. There are no acute bony abnormalities. Trace joint effusion is likely reactive. Soft tissues are unremarkable. IMPRESSION: 1. No acute displaced fracture. 2. Severe 3 compartmental osteoarthritis greatest in the lateral compartment, stable. Electronically Signed   By: 01/19/2018 M.D.   On: 04/01/2020 21:42    Procedures Procedures   Medications Ordered in ED Medications - No data to display  ED Course  I have reviewed the triage vital signs and the nursing notes.  Pertinent labs & imaging results that were available during my care of the patient were reviewed by me and considered in my medical decision making (see chart for details).    MDM Rules/Calculators/A&P                          Patient with right knee pain which is acute on chronic after a fall.   No fractures seen.  Severe OA is noted.  Will refer to ortho.  Will give knee brace.  Recommended compression stockings for swelling. Final Clinical Impression(s) / ED Diagnoses Final diagnoses:  Acute pain of right knee    Rx / DC Orders ED Discharge Orders         Ordered    Compression stockings        04/02/20 0000           04/04/20, PA-C 04/02/20  2202    Shon Baton, MD 04/02/20 [phone]

## 2020-04-02 NOTE — Progress Notes (Signed)
Orthopedic Tech Progress Note Patient Details:  Casey [name] [birth year] [id number]  Ortho Devices Type of Ortho Device: Knee Immobilizer Ortho Device/Splint Location: rle Ortho Device/Splint Interventions: Ordered,Application,Adjustment   Post Interventions Patient Tolerated: Well Instructions Provided: Care of device,Adjustment of device   [name] Post 04/02/2020, 1:34 AM

## 2020-04-02 NOTE — ED Notes (Signed)
Pt reported knee pain after falling. Pt was able to ambulate after knee immobilizer was placed by othro.

## 2020-04-05 ENCOUNTER — Other Ambulatory Visit: Payer: Self-pay | Admitting: Registered Nurse

## 2020-04-05 DIAGNOSIS — M170 Bilateral primary osteoarthritis of knee: Secondary | ICD-10-CM

## 2020-04-05 DIAGNOSIS — F411 Generalized anxiety disorder: Secondary | ICD-10-CM

## 2020-04-05 NOTE — Telephone Encounter (Signed)
Medication: HYDROcodone-acetaminophen (NORCO/VICODIN) 5-325 MG tablet  Has the pt contacted their pharmacy? No  Preferred pharmacy: [facility] [phone] - [place], [place] - [address] AT [facility] [address]  Please be advised refills may take up to 3 business days.  We ask that you follow up with your pharmacy.

## 2020-04-05 NOTE — Telephone Encounter (Signed)
Requested medication (s) are due for refill today: Yes  Requested medication (s) are on the active medication list: Yes  Last refill:  03/04/20  Future visit scheduled: Yes  Notes to clinic:  Unable to refill per protocol, cannot delegate.      Requested Prescriptions  Pending Prescriptions Disp Refills   HYDROcodone-acetaminophen (NORCO/VICODIN) 5-325 MG tablet 90 tablet 0    Sig: Take 1 tablet by mouth every 6 (six) hours as needed for moderate pain. For chronic pain      Not Delegated - Analgesics:  Opioid Agonist Combinations Failed - 04/05/2020 12:38 PM      Failed - This refill cannot be delegated      Failed - Urine Drug Screen completed in last 360 days      Passed - Valid encounter within last 6 months    Recent Outpatient Visits           2 months ago Type 2 diabetes mellitus with microalbuminuria, without long-term current use of insulin (HCC)   [facility], Gerlene Burdock, NP   4 months ago GAD (generalized anxiety disorder)   [facility], Gerlene Burdock, NP   5 months ago Medicare annual wellness visit, subsequent   Primary Care at [facility], Meda Coffee, MD   5 months ago HYPERTENSION, BENIGN ESSENTIAL   Primary [facility] at [facility], Meda Coffee, MD   8 months ago Type 2 diabetes mellitus with microalbuminuria, without long-term current use of insulin [facility])   [facility] at [facility], Meda Coffee, MD

## 2020-04-08 NOTE — Telephone Encounter (Signed)
Patient is requesting a refill of the following medications: Requested Prescriptions   Pending Prescriptions Disp Refills  . HYDROcodone-acetaminophen (NORCO/VICODIN) 5-325 MG tablet 90 tablet 0    Sig: Take 1 tablet by mouth every 6 (six) hours as needed for moderate pain. For chronic pain    Date of patient request: 04/05/2020 Last office visit: 01/22/2020 Date of last refill: 03/04/2020 Last refill amount: 90 tablets Follow up time period per chart:

## 2020-04-08 NOTE — Telephone Encounter (Signed)
Patient is requesting a refill of the following medications: Requested Prescriptions   Pending Prescriptions Disp Refills   HYDROcodone-acetaminophen (NORCO/VICODIN) 5-325 MG tablet 90 tablet 0    Sig: Take 1 tablet by mouth every 6 (six) hours as needed for moderate pain. For chronic pain    Date of patient request: 04/08/20 Last office visit: 01/22/20 Date of last refill: 03/04/20 Last refill amount: 90 Follow up time period per chart:

## 2020-04-08 NOTE — Telephone Encounter (Signed)
Pt called to request refill for HYDROcodone-acetaminophen (NORCO/VICODIN) 5-325 MG tablet Again/ please advise

## 2020-04-09 MED ORDER — HYDROCODONE-ACETAMINOPHEN 5-325 MG PO TABS
1.0000 | ORAL_TABLET | Freq: Four times a day (QID) | ORAL | 0 refills | Status: DC | PRN
Start: 2020-04-09 — End: 2020-05-23

## 2020-04-17 ENCOUNTER — Ambulatory Visit: Payer: Medicare Other | Admitting: Podiatry

## 2020-04-22 ENCOUNTER — Ambulatory Visit: Payer: Self-pay | Admitting: Registered Nurse

## 2020-04-23 DIAGNOSIS — M1711 Unilateral primary osteoarthritis, right knee: Secondary | ICD-10-CM | POA: Diagnosis not present

## 2020-05-05 ENCOUNTER — Other Ambulatory Visit: Payer: Self-pay | Admitting: Registered Nurse

## 2020-05-05 DIAGNOSIS — E1129 Type 2 diabetes mellitus with other diabetic kidney complication: Secondary | ICD-10-CM

## 2020-05-05 DIAGNOSIS — I10 Essential (primary) hypertension: Secondary | ICD-10-CM

## 2020-05-05 DIAGNOSIS — R809 Proteinuria, unspecified: Secondary | ICD-10-CM

## 2020-05-08 DIAGNOSIS — M25561 Pain in right knee: Secondary | ICD-10-CM | POA: Diagnosis not present

## 2020-05-09 ENCOUNTER — Ambulatory Visit: Payer: Medicare Other | Admitting: Adult Health

## 2020-05-14 DIAGNOSIS — M25561 Pain in right knee: Secondary | ICD-10-CM | POA: Diagnosis not present

## 2020-05-15 ENCOUNTER — Ambulatory Visit: Payer: Medicare Other | Admitting: Registered Nurse

## 2020-05-15 ENCOUNTER — Other Ambulatory Visit: Payer: Self-pay

## 2020-05-16 ENCOUNTER — Other Ambulatory Visit: Payer: Self-pay | Admitting: Registered Nurse

## 2020-05-16 ENCOUNTER — Ambulatory Visit: Payer: Medicare Other | Admitting: Registered Nurse

## 2020-05-16 DIAGNOSIS — F411 Generalized anxiety disorder: Secondary | ICD-10-CM

## 2020-05-19 ENCOUNTER — Other Ambulatory Visit: Payer: Self-pay | Admitting: Registered Nurse

## 2020-05-19 DIAGNOSIS — H8110 Benign paroxysmal vertigo, unspecified ear: Secondary | ICD-10-CM

## 2020-05-22 DIAGNOSIS — M25561 Pain in right knee: Secondary | ICD-10-CM | POA: Diagnosis not present

## 2020-05-23 ENCOUNTER — Ambulatory Visit (INDEPENDENT_AMBULATORY_CARE_PROVIDER_SITE_OTHER): Payer: Medicare Other | Admitting: Registered Nurse

## 2020-05-23 ENCOUNTER — Other Ambulatory Visit: Payer: Self-pay

## 2020-05-23 VITALS — BP 154/62 | HR 78 | Temp 98.3°F | Resp 18 | Ht 62.0 in | Wt 197.4 lb

## 2020-05-23 DIAGNOSIS — F411 Generalized anxiety disorder: Secondary | ICD-10-CM

## 2020-05-23 DIAGNOSIS — R197 Diarrhea, unspecified: Secondary | ICD-10-CM

## 2020-05-23 DIAGNOSIS — L700 Acne vulgaris: Secondary | ICD-10-CM | POA: Diagnosis not present

## 2020-05-23 DIAGNOSIS — M170 Bilateral primary osteoarthritis of knee: Secondary | ICD-10-CM

## 2020-05-23 DIAGNOSIS — E1129 Type 2 diabetes mellitus with other diabetic kidney complication: Secondary | ICD-10-CM | POA: Diagnosis not present

## 2020-05-23 DIAGNOSIS — J301 Allergic rhinitis due to pollen: Secondary | ICD-10-CM

## 2020-05-23 DIAGNOSIS — R809 Proteinuria, unspecified: Secondary | ICD-10-CM | POA: Diagnosis not present

## 2020-05-23 LAB — POCT GLYCOSYLATED HEMOGLOBIN (HGB A1C): Hemoglobin A1C: 6.7 % — AB (ref 4.0–5.6)

## 2020-05-23 MED ORDER — ALPRAZOLAM 0.25 MG PO TABS: ORAL_TABLET | ORAL | 3 refills | Status: DC

## 2020-05-23 MED ORDER — AZELASTINE HCL 0.1 % NA SOLN: 1.0000 | Freq: Two times a day (BID) | NASAL | 12 refills | Status: DC

## 2020-05-23 MED ORDER — HYDROCODONE-ACETAMINOPHEN 5-325 MG PO TABS: 1.0000 | ORAL_TABLET | Freq: Four times a day (QID) | ORAL | 0 refills | Status: DC | PRN

## 2020-05-23 NOTE — Progress Notes (Signed)
Established Patient Office Visit  Subjective:  Patient ID: Casey [name], female    DOB: 01-Feb-1935  Age: 85 y.o. MRN: [id number]  CC:  Chief Complaint  Patient presents with  . Follow-up    Patient states she is here for 3 month follow up on diabetes.     HPI CHANEY INGRAM presents for t2dm  Last A1c:  Lab Results  Component Value Date   HGBA1C 7.4 (A) 01/22/2020    Currently taking: metformin 500mg  PO bid ac. No new complications Reports good compliance with medications Diet has been improved Exercise habits have been limited but steady.  Otherwise: Diarrhea: Intermittent. Sometimes within 30 minutes after meals. No mucus or stool.  Identifies trigger foods like oatmeal and dairy  Allergies: Sinus congestion and pressure in ears Congestion in nose On and off chronically Has not tried medication Notes frequent seasonal allergies - all seasons No changes to products or changes in home  Acne: Notes clogged sebaceous pore on R jaw line. Swollen. No tenderness. Clear head visible.  Asking if she can put pressure on this spot  Otherwise feeling well. No further acute concerns. Last labs reviewed. Showed no acute concerns Pt prefers to delay repeating these until next visit.     Past Medical History:  Diagnosis Date  . ALLERGIC RHINITIS   . Allergy   . ANXIETY   . ARM PAIN, RIGHT   . BENIGN POSITIONAL VERTIGO   . Carcinoma in situ of colon   . CONSTIPATION   . CYSTOCELE WITH INCOMPLETE UTERINE PROLAPSE   . Diabetes mellitus without complication (HCC)   . ESOPHAGEAL STRICTURE   . FATIGUE   . FLANK PAIN, LEFT   . Headache(784.0)   . HIP PAIN, LEFT   . HYPERCHOLESTEROLEMIA   . HYPERGLYCEMIA   . HYPERTENSION, BENIGN ESSENTIAL 03/03/2007  . LEG EDEMA, BILATERAL   . Microscopic hematuria   . OBESITY   . OSTEOARTHRITIS, KNEE   . OSTEOPOROSIS   . Peripheral neuropathy, idiopathic   . Problems with swallowing and mastication   . SINUSITIS, RECURRENT    . SPRAIN&STRAIN OTH SPEC SITES SHOULDER&UPPER ARM     Past Surgical History:  Procedure Laterality Date  . LEFT HEART CATHETERIZATION WITH CORONARY ANGIOGRAM N/A 12/02/2010   Procedure: LEFT HEART CATHETERIZATION WITH CORONARY ANGIOGRAM;  Surgeon: Peter M 12/04/2010, MD;  Location: [facility] CATH LAB;  Service: Cardiovascular;:: NORMAL CORONARY ARTERIES - NORMAL LV FUNCTION & EDP.    [facility] TRANSTHORACIC ECHOCARDIOGRAM  11/2010   Normal EF 60-65%. No RWMA.  Mild LAD dilation c/w Gr 1 DD.  -- PRETTY NORMAL ECHO    Family History  Problem Relation Age of Onset  . Coronary artery disease Mother 86  . Cancer Father 37  . Heart disease Father   . Colon cancer Other   . Diabetes Other   . Coronary artery disease Sister   . Diabetes type II Sister   . Cancer Sister   . Coronary artery disease Brother   . Kidney disease Brother   . Coronary artery disease Brother   . Breast cancer Neg Hx     Social History   Socioeconomic History  . Marital status: Single    Spouse name: Not on file  . Number of children: 1  . Years of education: 45  . Highest education level: High school graduate  Occupational History  . Not on file  Tobacco Use  . Smoking status: Never Smoker  . Smokeless tobacco: Never Used  Vaping Use  . Vaping Use: Never used  Substance and Sexual Activity  . Alcohol use: No  . Drug use: No  . Sexual activity: Not Currently  Other Topics Concern  . Not on file  Social History Narrative   She tries to stay active, but over the last year (20 19-20 20), she has been very limited by her right knee pain.   Is able to go grocery shopping.   Social Determinants of Health   Financial Resource Strain: Not on file  Food Insecurity: Not on file  Transportation Needs: Not on file  Physical Activity: Not on file  Stress: Not on file  Social Connections: Not on file  Intimate Partner Violence: Not on file    Outpatient Medications Prior to Visit  Medication Sig Dispense Refill  .  amLODipine (NORVASC) 5 MG tablet TAKE 1 TABLET(5 MG) BY MOUTH DAILY 90 tablet 0  . aspirin EC 81 MG tablet Take 1 tablet (81 mg total) by mouth daily.    Marland Kitchen. atorvastatin (LIPITOR) 10 MG tablet Take 1 tablet (10 mg total) by mouth daily. 90 tablet 3  . furosemide (LASIX) 40 MG tablet Take 1 tablet (40 mg total) by mouth daily. 90 tablet 1  . gabapentin (NEURONTIN) 600 MG tablet TAKE 1 TABLET(600 MG) BY MOUTH THREE TIMES DAILY 270 tablet 2  . HYDROcodone-acetaminophen (NORCO/VICODIN) 5-325 MG tablet Take 1 tablet by mouth every 6 (six) hours as needed for moderate pain. For chronic pain 90 tablet 0  . hydrOXYzine (ATARAX/VISTARIL) 10 MG tablet TAKE 1 TABLET(10 MG) BY MOUTH THREE TIMES DAILY AS NEEDED 90 tablet 0  . lisinopril (ZESTRIL) 40 MG tablet Take 1 tablet (40 mg total) by mouth daily. 90 tablet 0  . meclizine (ANTIVERT) 25 MG tablet TAKE 1 TABLET(25 MG) BY MOUTH THREE TIMES DAILY 270 tablet 0  . metFORMIN (GLUCOPHAGE) 500 MG tablet TAKE 1 TABLET(500 MG) BY MOUTH TWICE DAILY WITH A MEAL 180 tablet 1  . omeprazole (PRILOSEC) 40 MG capsule Take 1 capsule (40 mg total) by mouth daily. 30 capsule 5  . traZODone (DESYREL) 50 MG tablet Take 1 tablet (50 mg total) by mouth at bedtime. 30 tablet 5  . vitamin B-12 (CYANOCOBALAMIN) 500 MCG tablet Take by mouth.    . ALPRAZolam (XANAX) 0.25 MG tablet TAKE 1 TABLET(0.25 MG) BY MOUTH AT BEDTIME AS NEEDED FOR ANXIETY (Patient not taking: No sig reported) 30 tablet 3   No facility-administered medications prior to visit.    No Known Allergies  ROS Review of Systems  Constitutional: Negative.   HENT: Negative.   Eyes: Negative.   Respiratory: Negative.   Cardiovascular: Negative.   Gastrointestinal: Negative.   Genitourinary: Negative.   Musculoskeletal: Negative.   Skin: Negative.   Neurological: Negative.   Psychiatric/Behavioral: Negative.   All other systems reviewed and are negative.     Objective:    Physical Exam Vitals and nursing  note reviewed.  Constitutional:      General: She is not in acute distress.    Appearance: Normal appearance. She is normal weight. She is not ill-appearing, toxic-appearing or diaphoretic.  Cardiovascular:     Rate and Rhythm: Normal rate and regular rhythm.     Heart sounds: Normal heart sounds. No murmur heard. No friction rub. No gallop.   Pulmonary:     Effort: Pulmonary effort is normal. No respiratory distress.     Breath sounds: Normal breath sounds. No stridor. No wheezing, rhonchi or rales.  Chest:  Chest wall: No tenderness.  Abdominal:     General: Abdomen is flat. Bowel sounds are normal. There is no distension.     Palpations: There is no mass.     Tenderness: There is no abdominal tenderness.  Skin:    General: Skin is warm and dry.     Findings: Lesion (clogged sebaceous pore r jaw line) present.  Neurological:     General: No focal deficit present.     Mental Status: She is alert and oriented to person, place, and time. Mental status is at baseline.  Psychiatric:        Mood and Affect: Mood normal.        Behavior: Behavior normal.        Thought Content: Thought content normal.        Judgment: Judgment normal.     BP (!) 154/62   Pulse 78   Temp 98.3 F (36.8 C) (Temporal)   Resp 18   Ht 5\' 2"  (1.575 m)   Wt 197 lb 6.4 oz (89.5 kg)   SpO2 99%   BMI 36.10 kg/m  Wt Readings from Last 3 Encounters:  05/23/20 197 lb 6.4 oz (89.5 kg)  04/01/20 242 lb 8.1 oz (110 kg)  01/22/20 208 lb 12.8 oz (94.7 kg)     Health Maintenance Due  Topic Date Due  . OPHTHALMOLOGY EXAM  11/24/2019    There are no preventive care reminders to display for this patient.  Lab Results  Component Value Date   TSH 1.300 07/17/2019   Lab Results  Component Value Date   WBC 6.5 10/20/2018   HGB 11.2 10/20/2018   HCT 35.1 10/20/2018   MCV 84 10/20/2018   PLT 336 10/20/2018   Lab Results  Component Value Date   NA 139 01/03/2020   K 4.1 01/03/2020   CO2 26  01/03/2020   GLUCOSE 106 (H) 01/03/2020   BUN 12 01/03/2020   CREATININE 0.93 01/03/2020   BILITOT 0.2 01/03/2020   ALKPHOS 106 01/03/2020   AST 18 01/03/2020   ALT 17 01/03/2020   PROT 7.4 01/03/2020   ALBUMIN 4.8 (H) 01/03/2020   CALCIUM 10.2 01/03/2020   Lab Results  Component Value Date   CHOL 175 07/17/2019   Lab Results  Component Value Date   HDL 87 07/17/2019   Lab Results  Component Value Date   LDLCALC 78 07/17/2019   Lab Results  Component Value Date   TRIG 47 07/17/2019   Lab Results  Component Value Date   CHOLHDL 2.0 07/17/2019   Lab Results  Component Value Date   HGBA1C 7.4 (A) 01/22/2020      Assessment & Plan:   Problem List Items Addressed This Visit      Respiratory   Allergic rhinitis   Relevant Medications   azelastine (ASTELIN) 0.1 % nasal spray     Endocrine   Type 2 diabetes mellitus with microalbuminuria, without long-term current use of insulin (HCC) - Primary (Chronic)   Relevant Orders   POCT glycosylated hemoglobin (Hb A1C) (Completed)     Other   GAD (generalized anxiety disorder)   Relevant Medications   ALPRAZolam (XANAX) 0.25 MG tablet    Other Visit Diagnoses    Frequent diarrhea       Blackhead       Primary osteoarthritis of both knees       Relevant Medications   HYDROcodone-acetaminophen (NORCO/VICODIN) 5-325 MG tablet      No orders of  the defined types were placed in this encounter.   Follow-up: No follow-ups on file.   PLAN  Refill alprazolam and norco. Discussed in depth safety with these medications. Pt voices agreement to safe use.  Azelastine nasal spray for congestion. If not improved can refer to ENT  A1c at 6.7 today, much improved, continue current regimen  Blackhead on face able to be relieved with pressure. Discussed hygiene to prevent recurrence.  Diarrhea - no red flags. Suggest fiber supplement and avoiding trigger foods. Occasional use of imodium ok but avoid over use. Should  significant weight loss, melena, brbpr or other red flags occur, low threshold for return to GI given her hx  Patient encouraged to call clinic with any questions, comments, or concerns.  [name], NP

## 2020-05-23 NOTE — Patient Instructions (Addendum)
  Casey [name] -   Always a pleasure to see you. In brief:  Diabetes: A1c at 6.7. This is great! Keep up the good work. We can check again in 6 mo.  Diarrhea: Try using imodium every now and then, but try to avoid daily use. Try to avoid foods that you know will cause you diarrhea. Keep an eye for blood or mucus in the stool. Keep an eye on your weight. If you rapidly lose weight or have any concerning symptoms, we will want to get you in to see a gastroenterologist quickly  Allergies: try the nasal spray I sent to the pharmacy - this is called Azelastine. If this doesn't work, let me know, I'll get you over to an ENT specialist.  Otherwise, it seems you're doing well overall. Keep up the good work!  Thanks,  Rich    If you have lab work done today you will be contacted with your lab results within the next 2 weeks.  If you have not heard from [name] then please contact us. The fastest way to get your results is to register for My Chart.   IF you received an x-ray today, you will receive an invoice from [facility]. Please contact [facility] at [phone] with questions or concerns regarding your invoice.   IF you received labwork today, you will receive an invoice from [facility]. Please contact [facility] at [phone] with questions or concerns regarding your invoice.   Our billing staff will not be able to assist you with questions regarding bills from these companies.  You will be contacted with the lab results as soon as they are available. The fastest way to get your results is to activate your My Chart account. Instructions are located on the last page of this paperwork. If you have not heard from [name] regarding the results in 2 weeks, please contact this office.

## 2020-05-29 DIAGNOSIS — M25561 Pain in right knee: Secondary | ICD-10-CM | POA: Diagnosis not present

## 2020-06-04 ENCOUNTER — Other Ambulatory Visit: Payer: Self-pay | Admitting: Registered Nurse

## 2020-06-04 DIAGNOSIS — I10 Essential (primary) hypertension: Secondary | ICD-10-CM

## 2020-06-04 DIAGNOSIS — M1711 Unilateral primary osteoarthritis, right knee: Secondary | ICD-10-CM | POA: Diagnosis not present

## 2020-06-05 ENCOUNTER — Telehealth: Payer: Self-pay | Admitting: Registered Nurse

## 2020-06-05 NOTE — Telephone Encounter (Signed)
Pre Operative Risk Form placed in [employer] bin up front

## 2020-06-06 NOTE — Telephone Encounter (Signed)
I have received this paperwork from up front  

## 2020-06-14 ENCOUNTER — Ambulatory Visit: Payer: Self-pay | Admitting: Student

## 2020-06-14 DIAGNOSIS — E119 Type 2 diabetes mellitus without complications: Secondary | ICD-10-CM | POA: Diagnosis not present

## 2020-06-20 ENCOUNTER — Ambulatory Visit: Payer: Medicare Other | Admitting: Neurology

## 2020-06-30 ENCOUNTER — Other Ambulatory Visit: Payer: Self-pay | Admitting: Registered Nurse

## 2020-06-30 DIAGNOSIS — K219 Gastro-esophageal reflux disease without esophagitis: Secondary | ICD-10-CM

## 2020-07-02 ENCOUNTER — Other Ambulatory Visit: Payer: Self-pay | Admitting: Neurology

## 2020-07-02 ENCOUNTER — Ambulatory Visit: Payer: Self-pay | Admitting: Student

## 2020-07-02 ENCOUNTER — Other Ambulatory Visit: Payer: Self-pay | Admitting: Registered Nurse

## 2020-07-02 DIAGNOSIS — M170 Bilateral primary osteoarthritis of knee: Secondary | ICD-10-CM

## 2020-07-02 NOTE — Telephone Encounter (Signed)
Patient is requesting a refill of the following medications: Requested Prescriptions   Pending Prescriptions Disp Refills   HYDROcodone-acetaminophen (NORCO/VICODIN) 5-325 MG tablet 90 tablet 0    Sig: Take 1 tablet by mouth every 6 (six) hours as needed for moderate pain. For chronic pain    Date of patient request: 07/02/20 Last office visit: 05/23/20 Date of last refill: 05/23/20 Last refill amount: 90 tab

## 2020-07-02 NOTE — H&P (Signed)
TOTAL KNEE ADMISSION H&P  Patient is being admitted for right total knee arthroplasty.  Subjective:  Chief Complaint:right knee pain.  HPI: Casey [name], 85 y.o. female, has a history of pain and functional disability in the right knee due to arthritis and has failed non-surgical conservative treatments for greater than 12 weeks to includeNSAID's and/or analgesics and activity modification.  Onset of symptoms was gradual, starting 3 years ago with gradually worsening course since that time. The patient noted no past surgery on the right knee(s).  Patient currently rates pain in the right knee(s) at 8 out of 10 with activity. Patient has pain that interferes with activities of daily living, pain with passive range of motion, and joint swelling.  Patient has evidence of subchondral cysts, subchondral sclerosis, and joint space narrowing by imaging studies. There is no active infection.  Patient Active Problem List   Diagnosis Date Noted   Cognitive deficit with impaired visuospatial function 01/03/2020   Memory impairment of gradual onset 01/03/2020   Anxiety about health 01/03/2020   Dementia without behavioral disturbance (HCC) 01/03/2020   Gastroesophageal reflux disease without esophagitis 07/17/2019   Preoperative cardiovascular examination 05/13/2018   Vitamin B12 deficiency 07/14/2017   Primary osteoarthritis of left knee 07/07/2013   Type 2 diabetes mellitus with microalbuminuria, without long-term current use of insulin (HCC) 05/26/2012   Anemia 10/23/2011   Encounter for medication monitoring 10/23/2011   Hyponatremia 12/01/2010   Chest pain with moderate risk for cardiac etiology 11/26/2010   CYSTOCELE WITH INCOMPLETE UTERINE PROLAPSE 03/10/2010   HYPERGLYCEMIA 02/06/2010   ESOPHAGEAL STRICTURE 12/11/2009   Obesity 12/09/2009   PROBLEMS WITH SWALLOWING AND MASTICATION 12/09/2009   MICROSCOPIC HEMATURIA 07/29/2009   ARM PAIN, RIGHT 03/28/2009   Fatigue 03/28/2009    CONSTIPATION 09/25/2008   Sinusitis, chronic 02/23/2008   Osteoarthrosis involving lower leg 09/28/2007   LEG EDEMA, BILATERAL 08/31/2007   FLANK PAIN, LEFT 06/08/2007   SPRAIN&STRAIN OTH SPEC SITES SHOULDER&UPPER ARM 06/08/2007   BENIGN POSITIONAL VERTIGO 04/25/2007   Headache 03/24/2007   HYPERCHOLESTEROLEMIA 03/03/2007   GAD (generalized anxiety disorder) 03/03/2007   HYPERTENSION, BENIGN ESSENTIAL 03/03/2007   Allergic rhinitis 03/03/2007   HIP PAIN, LEFT 03/03/2007   Osteoporosis 03/03/2007   Past Medical History:  Diagnosis Date   ALLERGIC RHINITIS    Allergy    ANXIETY    ARM PAIN, RIGHT    BENIGN POSITIONAL VERTIGO    Carcinoma in situ of colon    CONSTIPATION    CYSTOCELE WITH INCOMPLETE UTERINE PROLAPSE    Diabetes mellitus without complication (HCC)    ESOPHAGEAL STRICTURE    FATIGUE    FLANK PAIN, LEFT    Headache(784.0)    HIP PAIN, LEFT    HYPERCHOLESTEROLEMIA    HYPERGLYCEMIA    HYPERTENSION, BENIGN ESSENTIAL 03/03/2007   LEG EDEMA, BILATERAL    Microscopic hematuria    OBESITY    OSTEOARTHRITIS, KNEE    OSTEOPOROSIS    Peripheral neuropathy, idiopathic    Problems with swallowing and mastication    SINUSITIS, RECURRENT    SPRAIN&STRAIN OTH SPEC SITES SHOULDER&UPPER ARM     Past Surgical History:  Procedure Laterality Date   LEFT HEART CATHETERIZATION WITH CORONARY ANGIOGRAM N/A 12/02/2010   Procedure: LEFT HEART CATHETERIZATION WITH CORONARY ANGIOGRAM;  Surgeon: Peter M Swaz[place]and, MD;  Location: [facility] CATH LAB;  Service: Cardiovascular;:: NORMAL CORONARY ARTERIES - NORMAL LV FUNCTION & EDP.     TRANSTHORACIC ECHOCARDIOGRAM  11/2010   Normal EF 60-65%. No RWMA.  Mild LAD dilation c/w Gr 1 DD.  -- PRETTY NORMAL ECHO    Current Outpatient Medications  Medication Sig Dispense Refill Last Dose   ALPRAZolam (XANAX) 0.25 MG tablet TAKE 1 TABLET(0.25 MG) BY MOUTH AT BEDTIME AS NEEDED FOR ANXIETY 30 tablet 3    amLODipine (NORVASC) 5 MG tablet TAKE 1 TABLET(5 MG)  BY MOUTH DAILY 90 tablet 0    aspirin EC 81 MG tablet Take 1 tablet (81 mg total) by mouth daily.      atorvastatin (LIPITOR) 10 MG tablet Take 1 tablet (10 mg total) by mouth daily. 90 tablet 3    azelastine (ASTELIN) 0.1 % nasal spray Place 1 spray into both nostrils 2 (two) times daily. Use in each nostril as directed 30 mL 12    furosemide (LASIX) 40 MG tablet Take 1 tablet (40 mg total) by mouth daily. 90 tablet 1    gabapentin (NEURONTIN) 600 MG tablet TAKE 1 TABLET(600 MG) BY MOUTH THREE TIMES DAILY 270 tablet 2    HYDROcodone-acetaminophen (NORCO/VICODIN) 5-325 MG tablet Take 1 tablet by mouth every 6 (six) hours as needed for moderate pain. For chronic pain 90 tablet 0    hydrOXYzine (ATARAX/VISTARIL) 10 MG tablet TAKE 1 TABLET(10 MG) BY MOUTH THREE TIMES DAILY AS NEEDED 90 tablet 0    lisinopril (ZESTRIL) 40 MG tablet TAKE 1 TABLET(40 MG) BY MOUTH DAILY 90 tablet 0    meclizine (ANTIVERT) 25 MG tablet TAKE 1 TABLET(25 MG) BY MOUTH THREE TIMES DAILY 270 tablet 0    metFORMIN (GLUCOPHAGE) 500 MG tablet TAKE 1 TABLET(500 MG) BY MOUTH TWICE DAILY WITH A MEAL 180 tablet 1    omeprazole (PRILOSEC) 40 MG capsule TAKE 1 CAPSULE(40 MG) BY MOUTH DAILY 30 capsule 5    traZODone (DESYREL) 50 MG tablet Take 1 tablet (50 mg total) by mouth at bedtime. Must keep upcoming appt (8/3) for future refills 30 tablet 1    vitamin B-12 (CYANOCOBALAMIN) 500 MCG tablet Take by mouth.      No current facility-administered medications for this visit.   No Known Allergies  Social History   Tobacco Use   Smoking status: Never   Smokeless tobacco: Never  Substance Use Topics   Alcohol use: No    Family History  Problem Relation Age of Onset   Coronary artery disease Mother 66   Cancer Father 59   Heart disease Father    Colon cancer Other    Diabetes Other    Coronary artery disease Sister    Diabetes type II Sister    Cancer Sister    Coronary artery disease Brother    Kidney disease Brother     Coronary artery disease Brother    Breast cancer Neg Hx      Review of Systems  Musculoskeletal:  Positive for arthralgias.  All other systems reviewed and are negative.  Objective:  Physical Exam Eyes:     Pupils: Pupils are equal, round, and reactive to light.  Cardiovascular:     Rate and Rhythm: Normal rate.     Pulses: Normal pulses.  Pulmonary:     Effort: Pulmonary effort is normal.  Abdominal:     Palpations: Abdomen is soft.  Genitourinary:    Comments: Deferred Musculoskeletal:        General: Swelling and tenderness present.     Cervical back: Normal range of motion.  Skin:    General: Skin is warm and dry.  Neurological:     Mental Status:  She is alert and oriented to person, place, and time.  Psychiatric:        Mood and Affect: Mood normal.    Vital signs in last 24 hours: @VSRANGES @  Labs:   Estimated body mass index is 36.1 kg/m as calculated from the following:   Height as of 05/23/20: 5\' 2"  (1.575 m).   Weight as of 05/23/20: 89.5 kg.   Imaging Review Plain radiographs demonstrate severe degenerative joint disease of the right knee(s). The bone quality appears to be adequate for age and reported activity level.      Assessment/Plan:  End stage arthritis, right knee   The patient history, physical examination, clinical judgment of the provider and imaging studies are consistent with end stage degenerative joint disease of the right knee(s) and total knee arthroplasty is deemed medically necessary. The treatment options including medical management, injection therapy arthroscopy and arthroplasty were discussed at length. The risks and benefits of total knee arthroplasty were presented and reviewed. The risks due to aseptic loosening, infection, stiffness, patella tracking problems, thromboembolic complications and other imponderables were discussed. The patient acknowledged the explanation, agreed to proceed with the plan and consent was signed.  Patient is being admitted for inpatient treatment for surgery, pain control, PT, OT, prophylactic antibiotics, VTE prophylaxis, progressive ambulation and ADL's and discharge planning. The patient is planning to be discharged  home after overnight observation     Patient's anticipated LOS is less than 2 midnights, meeting these requirements: - Lives within 1 hour of care - Has a competent adult at home to recover with post-op recover - NO history of  - Chronic pain requiring opiods  - Diabetes  - Coronary Artery Disease  - Heart failure  - Heart attack  - Stroke  - DVT/VTE  - Cardiac arrhythmia  - Respiratory Failure/COPD  - Renal failure  - Anemia  - Advanced Liver disease

## 2020-07-02 NOTE — Telephone Encounter (Signed)
Pt called in asking for a new script of the hydrocodone to be sent in to the [facility] on [address]

## 2020-07-02 NOTE — H&P (View-Only) (Signed)
TOTAL KNEE ADMISSION H&P  Patient is being admitted for right total knee arthroplasty.  Subjective:  Chief Complaint:right knee pain.  HPI: Casey [name], 85 y.o. female, has a history of pain and functional disability in the right knee due to arthritis and has failed non-surgical conservative treatments for greater than 12 weeks to includeNSAID's and/or analgesics and activity modification.  Onset of symptoms was gradual, starting 3 years ago with gradually worsening course since that time. The patient noted no past surgery on the right knee(s).  Patient currently rates pain in the right knee(s) at 8 out of 10 with activity. Patient has pain that interferes with activities of daily living, pain with passive range of motion, and joint swelling.  Patient has evidence of subchondral cysts, subchondral sclerosis, and joint space narrowing by imaging studies. There is no active infection.  Patient Active Problem List   Diagnosis Date Noted   Cognitive deficit with impaired visuospatial function 01/03/2020   Memory impairment of gradual onset 01/03/2020   Anxiety about health 01/03/2020   Dementia without behavioral disturbance (HCC) 01/03/2020   Gastroesophageal reflux disease without esophagitis 07/17/2019   Preoperative cardiovascular examination 05/13/2018   Vitamin B12 deficiency 07/14/2017   Primary osteoarthritis of left knee 07/07/2013   Type 2 diabetes mellitus with microalbuminuria, without long-term current use of insulin (HCC) 05/26/2012   Anemia 10/23/2011   Encounter for medication monitoring 10/23/2011   Hyponatremia 12/01/2010   Chest pain with moderate risk for cardiac etiology 11/26/2010   CYSTOCELE WITH INCOMPLETE UTERINE PROLAPSE 03/10/2010   HYPERGLYCEMIA 02/06/2010   ESOPHAGEAL STRICTURE 12/11/2009   Obesity 12/09/2009   PROBLEMS WITH SWALLOWING AND MASTICATION 12/09/2009   MICROSCOPIC HEMATURIA 07/29/2009   ARM PAIN, RIGHT 03/28/2009   Fatigue 03/28/2009    CONSTIPATION 09/25/2008   Sinusitis, chronic 02/23/2008   Osteoarthrosis involving lower leg 09/28/2007   LEG EDEMA, BILATERAL 08/31/2007   FLANK PAIN, LEFT 06/08/2007   SPRAIN&STRAIN OTH SPEC SITES SHOULDER&UPPER ARM 06/08/2007   BENIGN POSITIONAL VERTIGO 04/25/2007   Headache 03/24/2007   HYPERCHOLESTEROLEMIA 03/03/2007   GAD (generalized anxiety disorder) 03/03/2007   HYPERTENSION, BENIGN ESSENTIAL 03/03/2007   Allergic rhinitis 03/03/2007   HIP PAIN, LEFT 03/03/2007   Osteoporosis 03/03/2007   Past Medical History:  Diagnosis Date   ALLERGIC RHINITIS    Allergy    ANXIETY    ARM PAIN, RIGHT    BENIGN POSITIONAL VERTIGO    Carcinoma in situ of colon    CONSTIPATION    CYSTOCELE WITH INCOMPLETE UTERINE PROLAPSE    Diabetes mellitus without complication (HCC)    ESOPHAGEAL STRICTURE    FATIGUE    FLANK PAIN, LEFT    Headache(784.0)    HIP PAIN, LEFT    HYPERCHOLESTEROLEMIA    HYPERGLYCEMIA    HYPERTENSION, BENIGN ESSENTIAL 03/03/2007   LEG EDEMA, BILATERAL    Microscopic hematuria    OBESITY    OSTEOARTHRITIS, KNEE    OSTEOPOROSIS    Peripheral neuropathy, idiopathic    Problems with swallowing and mastication    SINUSITIS, RECURRENT    SPRAIN&STRAIN OTH SPEC SITES SHOULDER&UPPER ARM     Past Surgical History:  Procedure Laterality Date   LEFT HEART CATHETERIZATION WITH CORONARY ANGIOGRAM N/A 12/02/2010   Procedure: LEFT HEART CATHETERIZATION WITH CORONARY ANGIOGRAM;  Surgeon: Peter M Swaziland, MD;  Location: [facility] CATH LAB;  Service: Cardiovascular;:: NORMAL CORONARY ARTERIES - NORMAL LV FUNCTION & EDP.     TRANSTHORACIC ECHOCARDIOGRAM  11/2010   Normal EF 60-65%. No RWMA.  Mild LAD dilation c/w Gr 1 DD.  -- PRETTY NORMAL ECHO    Current Outpatient Medications  Medication Sig Dispense Refill Last Dose   ALPRAZolam (XANAX) 0.25 MG tablet TAKE 1 TABLET(0.25 MG) BY MOUTH AT BEDTIME AS NEEDED FOR ANXIETY 30 tablet 3    amLODipine (NORVASC) 5 MG tablet TAKE 1 TABLET(5 MG)  BY MOUTH DAILY 90 tablet 0    aspirin EC 81 MG tablet Take 1 tablet (81 mg total) by mouth daily.      atorvastatin (LIPITOR) 10 MG tablet Take 1 tablet (10 mg total) by mouth daily. 90 tablet 3    azelastine (ASTELIN) 0.1 % nasal spray Place 1 spray into both nostrils 2 (two) times daily. Use in each nostril as directed 30 mL 12    furosemide (LASIX) 40 MG tablet Take 1 tablet (40 mg total) by mouth daily. 90 tablet 1    gabapentin (NEURONTIN) 600 MG tablet TAKE 1 TABLET(600 MG) BY MOUTH THREE TIMES DAILY 270 tablet 2    HYDROcodone-acetaminophen (NORCO/VICODIN) 5-325 MG tablet Take 1 tablet by mouth every 6 (six) hours as needed for moderate pain. For chronic pain 90 tablet 0    hydrOXYzine (ATARAX/VISTARIL) 10 MG tablet TAKE 1 TABLET(10 MG) BY MOUTH THREE TIMES DAILY AS NEEDED 90 tablet 0    lisinopril (ZESTRIL) 40 MG tablet TAKE 1 TABLET(40 MG) BY MOUTH DAILY 90 tablet 0    meclizine (ANTIVERT) 25 MG tablet TAKE 1 TABLET(25 MG) BY MOUTH THREE TIMES DAILY 270 tablet 0    metFORMIN (GLUCOPHAGE) 500 MG tablet TAKE 1 TABLET(500 MG) BY MOUTH TWICE DAILY WITH A MEAL 180 tablet 1    omeprazole (PRILOSEC) 40 MG capsule TAKE 1 CAPSULE(40 MG) BY MOUTH DAILY 30 capsule 5    traZODone (DESYREL) 50 MG tablet Take 1 tablet (50 mg total) by mouth at bedtime. Must keep upcoming appt (8/3) for future refills 30 tablet 1    vitamin B-12 (CYANOCOBALAMIN) 500 MCG tablet Take by mouth.      No current facility-administered medications for this visit.   No Known Allergies  Social History   Tobacco Use   Smoking status: Never   Smokeless tobacco: Never  Substance Use Topics   Alcohol use: No    Family History  Problem Relation Age of Onset   Coronary artery disease Mother 66   Cancer Father 59   Heart disease Father    Colon cancer Other    Diabetes Other    Coronary artery disease Sister    Diabetes type II Sister    Cancer Sister    Coronary artery disease Brother    Kidney disease Brother     Coronary artery disease Brother    Breast cancer Neg Hx      Review of Systems  Musculoskeletal:  Positive for arthralgias.  All other systems reviewed and are negative.  Objective:  Physical Exam Eyes:     Pupils: Pupils are equal, round, and reactive to light.  Cardiovascular:     Rate and Rhythm: Normal rate.     Pulses: Normal pulses.  Pulmonary:     Effort: Pulmonary effort is normal.  Abdominal:     Palpations: Abdomen is soft.  Genitourinary:    Comments: Deferred Musculoskeletal:        General: Swelling and tenderness present.     Cervical back: Normal range of motion.  Skin:    General: Skin is warm and dry.  Neurological:     Mental Status:  She is alert and oriented to person, place, and time.  Psychiatric:        Mood and Affect: Mood normal.    Vital signs in last 24 hours: @VSRANGES @  Labs:   Estimated body mass index is 36.1 kg/m as calculated from the following:   Height as of 05/23/20: 5\' 2"  (1.575 m).   Weight as of 05/23/20: 89.5 kg.   Imaging Review Plain radiographs demonstrate severe degenerative joint disease of the right knee(s). The bone quality appears to be adequate for age and reported activity level.      Assessment/Plan:  End stage arthritis, right knee   The patient history, physical examination, clinical judgment of the provider and imaging studies are consistent with end stage degenerative joint disease of the right knee(s) and total knee arthroplasty is deemed medically necessary. The treatment options including medical management, injection therapy arthroscopy and arthroplasty were discussed at length. The risks and benefits of total knee arthroplasty were presented and reviewed. The risks due to aseptic loosening, infection, stiffness, patella tracking problems, thromboembolic complications and other imponderables were discussed. The patient acknowledged the explanation, agreed to proceed with the plan and consent was signed.  Patient is being admitted for inpatient treatment for surgery, pain control, PT, OT, prophylactic antibiotics, VTE prophylaxis, progressive ambulation and ADL's and discharge planning. The patient is planning to be discharged  home after overnight observation     Patient's anticipated LOS is less than 2 midnights, meeting these requirements: - Lives within 1 hour of care - Has a competent adult at home to recover with post-op recover - NO history of  - Chronic pain requiring opiods  - Diabetes  - Coronary Artery Disease  - Heart failure  - Heart attack  - Stroke  - DVT/VTE  - Cardiac arrhythmia  - Respiratory Failure/COPD  - Renal failure  - Anemia  - Advanced Liver disease

## 2020-07-05 NOTE — Telephone Encounter (Signed)
Pt is called in asking for an update on this, she states that she is out of her medications.  Please advise

## 2020-07-09 ENCOUNTER — Telehealth: Payer: Self-pay | Admitting: Registered Nurse

## 2020-07-09 MED ORDER — HYDROCODONE-ACETAMINOPHEN 5-325 MG PO TABS: 1.0000 | ORAL_TABLET | Freq: Four times a day (QID) | ORAL | 0 refills | Status: DC | PRN

## 2020-07-09 NOTE — Telephone Encounter (Signed)
Pt called in stating that she needs some pw filled out to be able to have surgery. I told her  I don't see where we received the forms. I asked her to call the surgeons office to have them send it to [name] again.

## 2020-07-17 ENCOUNTER — Other Ambulatory Visit: Payer: Self-pay | Admitting: Registered Nurse

## 2020-07-17 DIAGNOSIS — F411 Generalized anxiety disorder: Secondary | ICD-10-CM

## 2020-07-17 NOTE — Telephone Encounter (Signed)
LFD 05/16/20 #90 with no refills LOV 05/23/20 NOV none

## 2020-07-17 NOTE — Patient Instructions (Addendum)
DUE TO COVID-19 ONLY ONE VISITOR IS ALLOWED TO COME WITH YOU AND STAY IN THE WAITING ROOM ONLY DURING PRE OP AND PROCEDURE DAY OF SURGERY. THE 2 VISITORS  MAY VISIT WITH YOU AFTER SURGERY IN YOUR PRIVATE ROOM DURING VISITING HOURS ONLY!  YOU NEED TO HAVE A COVID 19 TEST ON__7/5_____ @_9 :35______, THIS TEST MUST BE DONE BEFORE SURGERY,  COVID TESTING SITE [address] [place] , IT IS ON THE RIGHT GOING OUT [address] APPROXIMATELY  2 MINUTES PAST [employer] ON THE RIGHT. ONCE YOUR COVID TEST IS COMPLETED,  PLEASE BEGIN THE QUARANTINE INSTRUCTIONS AS OUTLINED IN YOUR HANDOUT.                [id number]     Your procedure is scheduled on: 07/24/20   Report to [facility] Main  Entrance   Report to admitting at  6:00 AM     Call this number if you have problems the morning of surgery [phone]    BRUSH YOUR TEETH MORNING OF SURGERY AND RINSE YOUR MOUTH OUT, NO CHEWING GUM CANDY OR MINTS.   No food after midnight.    You may have clear liquid until 5:30 AM.    At 5:00 AM drink pre surgery drink.   Nothing by mouth after 5:30 AM.    Take these medicines the morning of surgery with A SIP OF WATER: Gabapentin, Amlodipine, Omeprazole  How to Manage Your Diabetes Before and After Surgery  Why is it important to control my blood sugar before and after surgery? Improving blood sugar levels before and after surgery helps healing and can limit problems. A way of improving blood sugar control is eating a healthy diet by:  Eating less sugar and carbohydrates  Increasing activity/exercise  Talking with your doctor about reaching your blood sugar goals High blood sugars (greater than 180 mg/dL) can raise your risk of infections and slow your recovery, so you will need to focus on controlling your diabetes during the weeks before surgery. Make sure that the doctor who takes care of your diabetes knows about your planned surgery including the date  and location.     WHAT DO I DO ABOUT MY DIABETES MEDICATION?  Do not take oral diabetes medicines (pills) the morning of surgery.                                   You may not have any metal on your body including hair pins and              piercings  Do not wear jewelry, make-up, lotions, powders or perfumes, deodorant             Do not wear nail polish on your fingernails.  Do not shave  48 hours prior to surgery.              Do not bring valuables to the hospital. [facility] IS NOT             RESPONSIBLE   FOR VALUABLES.  Contacts, dentures or bridgework may not be worn into surgery.                    Please read over the following fact sheets you were given: _____________________________________________________________________             [facility] - Preparing for Surgery Before surgery,  you can play an important role.  Because skin is not sterile, your skin needs to be as free of germs as possible.  You can reduce the number of germs on your skin by washing with CHG (chlorahexidine gluconate) soap before surgery.  CHG is an antiseptic cleaner which kills germs and bonds with the skin to continue killing germs even after washing. Please DO NOT use if you have an allergy to CHG or antibacterial soaps.  If your skin becomes reddened/irritated stop using the CHG and inform your nurse when you arrive at Short Stay. Do not shave (including legs and underarms) for at least 48 hours prior to the first CHG shower.   Please follow these instructions carefully:  1.  Shower with CHG Soap the night before surgery and the  morning of Surgery.  2.  If you choose to wash your hair, wash your hair first as usual with your  normal  shampoo.  3.  After you shampoo, rinse your hair and body thoroughly to remove the  shampoo.                                        4.  Use CHG as you would any other liquid soap.  You can apply chg directly  to the skin and wash                       Gently  with a scrungie or clean washcloth.  5.  Apply the CHG Soap to your body ONLY FROM THE NECK DOWN.   Do not use on face/ open                           Wound or open sores. Avoid contact with eyes, ears mouth and genitals (private parts).                       Wash face,  Genitals (private parts) with your normal soap.             6.  Wash thoroughly, paying special attention to the area where your surgery  will be performed.  7.  Thoroughly rinse your body with warm water from the neck down.  8.  DO NOT shower/wash with your normal soap after using and rinsing off  the CHG Soap.             9.  Pat yourself dry with a clean towel.            10.  Wear clean pajamas.            11.  Place clean sheets on your bed the night of your first shower and do not  sleep with pets. Day of Surgery : Do not apply any lotions/deodorants the morning of surgery.  Please wear clean clothes to the hospital/surgery center.  FAILURE TO FOLLOW THESE INSTRUCTIONS MAY RESULT IN THE CANCELLATION OF YOUR SURGERY PATIENT SIGNATURE_________________________________  NURSE SIGNATURE__________________________________  ________________________________________________________________________   Casey [name]  An incentive spirometer is a tool that can help keep your lungs clear and active. This tool measures how well you are filling your lungs with each breath. Taking long deep breaths may help reverse or decrease the chance of developing breathing (pulmonary) problems (especially infection) following: A long period of time when you are  unable to move or be active. BEFORE THE PROCEDURE  If the spirometer includes an indicator to show your best effort, your nurse or respiratory therapist will set it to a desired goal. If possible, sit up straight or lean slightly forward. Try not to slouch. Hold the incentive spirometer in an upright position. INSTRUCTIONS FOR USE  Sit on the edge of your bed if possible, or sit  up as far as you can in bed or on a chair. Hold the incentive spirometer in an upright position. Breathe out normally. Place the mouthpiece in your mouth and seal your lips tightly around it. Breathe in slowly and as deeply as possible, raising the piston or the ball toward the top of the column. Hold your breath for 3-5 seconds or for as long as possible. Allow the piston or ball to fall to the bottom of the column. Remove the mouthpiece from your mouth and breathe out normally. Rest for a few seconds and repeat Steps 1 through 7 at least 10 times every 1-2 hours when you are awake. Take your time and take a few normal breaths between deep breaths. The spirometer may include an indicator to show your best effort. Use the indicator as a goal to work toward during each repetition. After each set of 10 deep breaths, practice coughing to be sure your lungs are clear. If you have an incision (the cut made at the time of surgery), support your incision when coughing by placing a pillow or rolled up towels firmly against it. Once you are able to get out of bed, walk around indoors and cough well. You may stop using the incentive spirometer when instructed by your caregiver.  RISKS AND COMPLICATIONS Take your time so you do not get dizzy or light-headed. If you are in pain, you may need to take or ask for pain medication before doing incentive spirometry. It is harder to take a deep breath if you are having pain. AFTER USE Rest and breathe slowly and easily. It can be helpful to keep track of a log of your progress. Your caregiver can provide you with a simple table to help with this. If you are using the spirometer at home, follow these instructions: SEEK MEDICAL CARE IF:  You are having difficultly using the spirometer. You have trouble using the spirometer as often as instructed. Your pain medication is not giving enough relief while using the spirometer. You develop fever of 100.5 F (38.1 C) or  higher. SEEK IMMEDIATE MEDICAL CARE IF:  You cough up bloody sputum that had not been present before. You develop fever of 102 F (38.9 C) or greater. You develop worsening pain at or near the incision site. MAKE SURE YOU:  Understand these instructions. Will watch your condition. Will get help right away if you are not doing well or get worse. Document Released: 05/18/2006 Document Revised: 03/30/2011 Document Reviewed: 07/19/2006 [facility] Patient Information 2014 [place], [place].   ________________________________________________________________________

## 2020-07-18 ENCOUNTER — Encounter (HOSPITAL_COMMUNITY): Payer: Self-pay

## 2020-07-18 ENCOUNTER — Other Ambulatory Visit: Payer: Self-pay

## 2020-07-18 ENCOUNTER — Encounter (HOSPITAL_COMMUNITY)
Admission: RE | Admit: 2020-07-18 | Discharge: 2020-07-18 | Disposition: A | Discharge status: Home / Self Care | Payer: Medicare Other | Source: Ambulatory Visit | Attending: Orthopedic Surgery | Admitting: Orthopedic Surgery

## 2020-07-18 DIAGNOSIS — Z01818 Encounter for other preprocedural examination: Secondary | ICD-10-CM | POA: Insufficient documentation

## 2020-07-18 LAB — CBC
HCT: 35 % — ABNORMAL LOW (ref 36.0–46.0)
Hemoglobin: 11.2 g/dL — ABNORMAL LOW (ref 12.0–15.0)
MCH: 27.6 pg (ref 26.0–34.0)
MCHC: 32 g/dL (ref 30.0–36.0)
MCV: 86.2 fL (ref 80.0–100.0)
Platelets: 319 10*3/uL (ref 150–400)
RBC: 4.06 MIL/uL (ref 3.87–5.11)
RDW: 14.9 % (ref 11.5–15.5)
WBC: 8.6 10*3/uL (ref 4.0–10.5)
nRBC: 0 % (ref 0.0–0.2)

## 2020-07-18 LAB — URINALYSIS, ROUTINE W REFLEX MICROSCOPIC
Bilirubin Urine: NEGATIVE
Glucose, UA: NEGATIVE mg/dL
Hgb urine dipstick: NEGATIVE
Ketones, ur: NEGATIVE mg/dL
Leukocytes,Ua: NEGATIVE
Nitrite: NEGATIVE
Protein, ur: NEGATIVE mg/dL
Specific Gravity, Urine: 1.013 (ref 1.005–1.030)
pH: 7 (ref 5.0–8.0)

## 2020-07-18 LAB — PROTIME-INR
INR: 1.1 (ref 0.8–1.2)
Prothrombin Time: 13.8 seconds (ref 11.4–15.2)

## 2020-07-18 LAB — SURGICAL PCR SCREEN
MRSA, PCR: NEGATIVE
Staphylococcus aureus: NEGATIVE

## 2020-07-18 LAB — GLUCOSE, CAPILLARY: Glucose-Capillary: 126 mg/dL — ABNORMAL HIGH (ref 70–99)

## 2020-07-18 LAB — COMPREHENSIVE METABOLIC PANEL
ALT: 12 U/L (ref 0–44)
AST: 17 U/L (ref 15–41)
Albumin: 4.2 g/dL (ref 3.5–5.0)
Alkaline Phosphatase: 77 U/L (ref 38–126)
Anion gap: 7 (ref 5–15)
BUN: 11 mg/dL (ref 8–23)
CO2: 30 mmol/L (ref 22–32)
Calcium: 9.4 mg/dL (ref 8.9–10.3)
Chloride: 100 mmol/L (ref 98–111)
Creatinine, Ser: 0.72 mg/dL (ref 0.44–1.00)
GFR, Estimated: >60 mL/min (ref >60)
Glucose, Bld: 121 mg/dL — ABNORMAL HIGH (ref 70–99)
Potassium: 3.4 mmol/L — ABNORMAL LOW (ref 3.5–5.1)
Sodium: 137 mmol/L (ref 135–145)
Total Bilirubin: 0.6 mg/dL (ref 0.3–1.2)
Total Protein: 7.6 g/dL (ref 6.5–8.1)

## 2020-07-18 NOTE — Progress Notes (Addendum)
COVID Vaccine Completed:Yes Date COVID Vaccine completed:04/04/19-booster 12/25/19 COVID vaccine manufacturer: Pfizer     PCP - [name] NP [facility] 05/23/20 Cardiologist - Dr. Ranae Palms [facility] 06/08/18  Chest x-ray - no EKG - 07/18/20-chart Stress Test - 06/08/18--epic ECHO - 12/02/10-epic Cardiac Cath - 12/02/10-epic Pacemaker/ICD device last checked:NA  Sleep Study - no CPAP -   Fasting Blood Sugar - Pt doesn't check Checks Blood Sugar _____ times a day  Blood Thinner Instructions:NA Aspirin Instructions: Last Dose:  Anesthesia review: yes  Patient denies shortness of breath, fever, cough and chest pain at PAT appointment Pt walks slowly with a cane. She lives alone and reports no SOB doing housework or with ADLs.  Patient verbalized understanding of instructions that were given to them at the PAT appointment. Patient was also instructed that they will need to review over the PAT instructions again at home before surgery. yes

## 2020-07-19 LAB — HEMOGLOBIN A1C
Hgb A1c MFr Bld: 6.6 % — ABNORMAL HIGH (ref 4.8–5.6)
Mean Plasma Glucose: 143 mg/dL

## 2020-07-23 ENCOUNTER — Encounter (HOSPITAL_COMMUNITY): Payer: Self-pay | Admitting: Orthopedic Surgery

## 2020-07-23 ENCOUNTER — Other Ambulatory Visit (HOSPITAL_COMMUNITY)
Admission: RE | Admit: 2020-07-23 | Discharge: 2020-07-23 | Disposition: A | Discharge status: Home / Self Care | Payer: Medicare Other | Source: Ambulatory Visit | Attending: Orthopedic Surgery | Admitting: Orthopedic Surgery

## 2020-07-23 DIAGNOSIS — Z01812 Encounter for preprocedural laboratory examination: Secondary | ICD-10-CM | POA: Diagnosis present

## 2020-07-23 DIAGNOSIS — Z20822 Contact with and (suspected) exposure to covid-19: Secondary | ICD-10-CM | POA: Insufficient documentation

## 2020-07-23 NOTE — Anesthesia Preprocedure Evaluation (Addendum)
Anesthesia Evaluation  Patient identified by MRN, date of birth, ID band Patient awake    Reviewed: Allergy & Precautions, NPO status , Patient's Chart, lab work & pertinent test results, reviewed documented beta blocker date and time   Airway Mallampati: II  TM Distance: >3 FB Neck ROM: Full    Dental  (+) Dental Advisory Given, Loose, Missing, Poor Dentition,    Pulmonary neg pulmonary ROS,    Pulmonary exam normal breath sounds clear to auscultation       Cardiovascular hypertension, Pt. on medications Normal cardiovascular exam Rhythm:Regular Rate:Normal  EKG 07/18/20 NSR, LAD, poor R wave progression in V leads   Neuro/Psych  Headaches, PSYCHIATRIC DISORDERS Anxiety Dementia Gradual memory impairmentHx/o BPV Peripheral neuropathy  Neuromuscular disease    GI/Hepatic Neg liver ROS, GERD  Medicated and Controlled,Hx/o esophageal stricture with dysphagia   Endo/Other  diabetes, Well Controlled, Type 2, Oral Hypoglycemic AgentsObesity Hyperlipidemia  Renal/GU negative Renal ROS   Hx/o cystocele    Musculoskeletal  (+) Arthritis , Osteoarthritis,  Osteoporosis   Abdominal (+) + obese,   Peds  Hematology  (+) anemia ,   Anesthesia Other Findings   Reproductive/Obstetrics                           Anesthesia Physical Anesthesia Plan  ASA: 3  Anesthesia Plan: Spinal   Post-op Pain Management:  Regional for Post-op pain   Induction: Intravenous  PONV Risk Score and Plan: 3 and Treatment may vary due to age or medical condition and Propofol infusion  Airway Management Planned: Natural Airway and Simple Face Mask  Additional Equipment:   Intra-op Plan:   Post-operative Plan:   Informed Consent: I have reviewed the patients History and Physical, chart, labs and discussed the procedure including the risks, benefits and alternatives for the proposed anesthesia with the patient or  authorized representative who has indicated his/her understanding and acceptance.     Dental advisory given  Plan Discussed with: CRNA and Anesthesiologist  Anesthesia Plan Comments: (No versed.)       Anesthesia Quick Evaluation

## 2020-07-24 ENCOUNTER — Encounter (HOSPITAL_COMMUNITY): Payer: Self-pay | Admitting: Orthopedic Surgery

## 2020-07-24 ENCOUNTER — Encounter (HOSPITAL_COMMUNITY)
Admission: RE | Disposition: A | Discharge status: Home / Self Care | Payer: Self-pay | Source: Home / Self Care | Attending: Orthopedic Surgery

## 2020-07-24 ENCOUNTER — Ambulatory Visit (HOSPITAL_COMMUNITY): Payer: Medicare Other

## 2020-07-24 ENCOUNTER — Ambulatory Visit (HOSPITAL_COMMUNITY): Payer: Medicare Other | Admitting: Anesthesiology

## 2020-07-24 ENCOUNTER — Other Ambulatory Visit: Payer: Self-pay

## 2020-07-24 ENCOUNTER — Ambulatory Visit (HOSPITAL_COMMUNITY)
Admission: RE | Admit: 2020-07-24 | Discharge: 2020-07-26 | Disposition: A | Discharge status: Home / Self Care | Payer: Medicare Other | Attending: Orthopedic Surgery | Admitting: Orthopedic Surgery

## 2020-07-24 DIAGNOSIS — M1711 Unilateral primary osteoarthritis, right knee: Secondary | ICD-10-CM | POA: Diagnosis present

## 2020-07-24 DIAGNOSIS — Z79899 Other long term (current) drug therapy: Secondary | ICD-10-CM | POA: Diagnosis not present

## 2020-07-24 DIAGNOSIS — Z86004 Personal history of in-situ neoplasm of other and unspecified digestive organs: Secondary | ICD-10-CM | POA: Insufficient documentation

## 2020-07-24 DIAGNOSIS — Z7984 Long term (current) use of oral hypoglycemic drugs: Secondary | ICD-10-CM | POA: Diagnosis not present

## 2020-07-24 DIAGNOSIS — Z7982 Long term (current) use of aspirin: Secondary | ICD-10-CM | POA: Diagnosis not present

## 2020-07-24 HISTORY — PX: KNEE ARTHROPLASTY: SHX992

## 2020-07-24 LAB — TYPE AND SCREEN
ABO/RH(D): AB POS
Antibody Screen: POSITIVE
Donor AG Type: NEGATIVE
Donor AG Type: NEGATIVE
PT AG Type: NEGATIVE
Unit division: 0
Unit division: 0

## 2020-07-24 LAB — BPAM RBC
Blood Product Expiration Date: 202208012359
Blood Product Expiration Date: 202208042359
Unit Type and Rh: 6200
Unit Type and Rh: 6200

## 2020-07-24 LAB — GLUCOSE, CAPILLARY
Glucose-Capillary: 160 mg/dL — ABNORMAL HIGH (ref 70–99)
Glucose-Capillary: 107 mg/dL — ABNORMAL HIGH (ref 70–99)

## 2020-07-24 LAB — SARS CORONAVIRUS 2 (TAT 6-24 HRS): SARS Coronavirus 2: NEGATIVE

## 2020-07-24 SURGERY — ARTHROPLASTY, KNEE, TOTAL, USING IMAGELESS COMPUTER-ASSISTED NAVIGATION
Anesthesia: Spinal | Site: Knee | Laterality: Right

## 2020-07-24 MED ORDER — ORAL CARE MOUTH RINSE: 15.0000 mL | Freq: Once | OROMUCOSAL | Status: AC

## 2020-07-24 MED ORDER — PROPOFOL 1000 MG/100ML IV EMUL
INTRAVENOUS | Status: AC
  Filled 2020-07-24: qty 100

## 2020-07-24 MED ORDER — ONDANSETRON HCL 4 MG/2ML IJ SOLN: 4.0000 mg | Freq: Four times a day (QID) | INTRAMUSCULAR | Status: DC | PRN

## 2020-07-24 MED ORDER — POVIDONE-IODINE 10 % EX SWAB
2.0000 "application " | Freq: Once | CUTANEOUS | Status: AC
  Administered 2020-07-24: 2 via TOPICAL

## 2020-07-24 MED ORDER — CEFAZOLIN SODIUM-DEXTROSE 2-4 GM/100ML-% IV SOLN
2.0000 g | Freq: Four times a day (QID) | INTRAVENOUS | Status: AC
  Administered 2020-07-24 (×2): 2 g via INTRAVENOUS
  Filled 2020-07-24 (×2): qty 100

## 2020-07-24 MED ORDER — FUROSEMIDE 40 MG PO TABS
40.0000 mg | ORAL_TABLET | Freq: Every day | ORAL | Status: DC
  Administered 2020-07-24 – 2020-07-26 (×3): 40 mg via ORAL
  Filled 2020-07-24 (×3): qty 1

## 2020-07-24 MED ORDER — PHENYLEPHRINE HCL-NACL 10-0.9 MG/250ML-% IV SOLN
INTRAVENOUS | Status: DC | PRN
  Administered 2020-07-24: 25 ug/min via INTRAVENOUS

## 2020-07-24 MED ORDER — PHENOL 1.4 % MT LIQD: 1.0000 | OROMUCOSAL | Status: DC | PRN

## 2020-07-24 MED ORDER — SODIUM CHLORIDE 0.9 % IR SOLN
Status: DC | PRN
  Administered 2020-07-24: 1000 mL

## 2020-07-24 MED ORDER — ALPRAZOLAM 0.25 MG PO TABS: 0.2500 mg | ORAL_TABLET | Freq: Every evening | ORAL | Status: DC | PRN

## 2020-07-24 MED ORDER — ACETAMINOPHEN 325 MG PO TABS: 325.0000 mg | ORAL_TABLET | Freq: Four times a day (QID) | ORAL | Status: DC | PRN

## 2020-07-24 MED ORDER — PROPOFOL 10 MG/ML IV BOLUS
INTRAVENOUS | Status: DC | PRN
  Administered 2020-07-24: 40 mg via INTRAVENOUS

## 2020-07-24 MED ORDER — PHENYLEPHRINE HCL (PRESSORS) 10 MG/ML IV SOLN
INTRAVENOUS | Status: AC
  Filled 2020-07-24: qty 1

## 2020-07-24 MED ORDER — PROPOFOL 500 MG/50ML IV EMUL
INTRAVENOUS | Status: DC | PRN
  Administered 2020-07-24: 80 ug/kg/min via INTRAVENOUS

## 2020-07-24 MED ORDER — POLYETHYLENE GLYCOL 3350 17 G PO PACK: 17.0000 g | PACK | Freq: Every day | ORAL | Status: DC | PRN

## 2020-07-24 MED ORDER — FENTANYL CITRATE (PF) 100 MCG/2ML IJ SOLN: 25.0000 ug | INTRAMUSCULAR | Status: DC | PRN

## 2020-07-24 MED ORDER — METHOCARBAMOL 1000 MG/10ML IJ SOLN
500.0000 mg | Freq: Four times a day (QID) | INTRAVENOUS | Status: DC | PRN
  Filled 2020-07-24: qty 5

## 2020-07-24 MED ORDER — ONDANSETRON HCL 4 MG/2ML IJ SOLN
INTRAMUSCULAR | Status: DC | PRN
  Administered 2020-07-24: 4 mg via INTRAVENOUS

## 2020-07-24 MED ORDER — CELECOXIB 200 MG PO CAPS
200.0000 mg | ORAL_CAPSULE | Freq: Two times a day (BID) | ORAL | Status: DC
  Administered 2020-07-24 – 2020-07-26 (×5): 200 mg via ORAL
  Filled 2020-07-24 (×5): qty 1

## 2020-07-24 MED ORDER — LACTATED RINGERS IV SOLN: INTRAVENOUS | Status: DC

## 2020-07-24 MED ORDER — GABAPENTIN 300 MG PO CAPS
600.0000 mg | ORAL_CAPSULE | Freq: Three times a day (TID) | ORAL | Status: DC
  Administered 2020-07-24 – 2020-07-26 (×6): 600 mg via ORAL
  Filled 2020-07-24 (×6): qty 2

## 2020-07-24 MED ORDER — ROPIVACAINE HCL 7.5 MG/ML IJ SOLN
INTRAMUSCULAR | Status: DC | PRN
  Administered 2020-07-24: 20 mL via PERINEURAL

## 2020-07-24 MED ORDER — PHENYLEPHRINE HCL-NACL 10-0.9 MG/250ML-% IV SOLN
INTRAVENOUS | Status: AC
  Filled 2020-07-24: qty 250

## 2020-07-24 MED ORDER — ONDANSETRON HCL 4 MG PO TABS: 4.0000 mg | ORAL_TABLET | Freq: Four times a day (QID) | ORAL | Status: DC | PRN

## 2020-07-24 MED ORDER — PANTOPRAZOLE SODIUM 40 MG PO TBEC
80.0000 mg | DELAYED_RELEASE_TABLET | Freq: Every day | ORAL | Status: DC
  Administered 2020-07-24 – 2020-07-26 (×3): 80 mg via ORAL
  Filled 2020-07-24 (×3): qty 2

## 2020-07-24 MED ORDER — HYDROXYZINE HCL 10 MG PO TABS
10.0000 mg | ORAL_TABLET | Freq: Three times a day (TID) | ORAL | Status: DC | PRN
  Filled 2020-07-24: qty 1

## 2020-07-24 MED ORDER — BUPIVACAINE-EPINEPHRINE (PF) 0.25% -1:200000 IJ SOLN
INTRAMUSCULAR | Status: AC
  Filled 2020-07-24: qty 30

## 2020-07-24 MED ORDER — STERILE WATER FOR IRRIGATION IR SOLN
Status: DC | PRN
  Administered 2020-07-24: 2000 mL

## 2020-07-24 MED ORDER — DIPHENHYDRAMINE HCL 12.5 MG/5ML PO ELIX: 12.5000 mg | ORAL_SOLUTION | ORAL | Status: DC | PRN

## 2020-07-24 MED ORDER — AMLODIPINE BESYLATE 5 MG PO TABS
5.0000 mg | ORAL_TABLET | Freq: Every morning | ORAL | Status: DC
  Administered 2020-07-25 – 2020-07-26 (×2): 5 mg via ORAL
  Filled 2020-07-24 (×2): qty 1

## 2020-07-24 MED ORDER — ASPIRIN 81 MG PO CHEW
81.0000 mg | CHEWABLE_TABLET | Freq: Two times a day (BID) | ORAL | Status: DC
  Administered 2020-07-24 – 2020-07-26 (×4): 81 mg via ORAL
  Filled 2020-07-24 (×4): qty 1

## 2020-07-24 MED ORDER — OXYCODONE HCL 5 MG/5ML PO SOLN: 5.0000 mg | Freq: Once | ORAL | Status: DC | PRN

## 2020-07-24 MED ORDER — POLYVINYL ALCOHOL 1.4 % OP SOLN
1.0000 [drp] | Freq: Every day | OPHTHALMIC | Status: DC
  Administered 2020-07-25 – 2020-07-26 (×2): 1 [drp] via OPHTHALMIC
  Filled 2020-07-24: qty 15

## 2020-07-24 MED ORDER — ONDANSETRON HCL 4 MG/2ML IJ SOLN
INTRAMUSCULAR | Status: AC
  Filled 2020-07-24: qty 2

## 2020-07-24 MED ORDER — ISOPROPYL ALCOHOL 70 % SOLN
Status: DC | PRN
  Administered 2020-07-24: 1 via TOPICAL

## 2020-07-24 MED ORDER — DOCUSATE SODIUM 100 MG PO CAPS
100.0000 mg | ORAL_CAPSULE | Freq: Two times a day (BID) | ORAL | Status: DC
  Administered 2020-07-24 – 2020-07-26 (×5): 100 mg via ORAL
  Filled 2020-07-24 (×5): qty 1

## 2020-07-24 MED ORDER — POVIDONE-IODINE 10 % EX SWAB: 2.0000 "application " | Freq: Once | CUTANEOUS | Status: DC

## 2020-07-24 MED ORDER — KETOROLAC TROMETHAMINE 30 MG/ML IJ SOLN
INTRAMUSCULAR | Status: AC
  Filled 2020-07-24: qty 1

## 2020-07-24 MED ORDER — ONDANSETRON HCL 4 MG/2ML IJ SOLN: 4.0000 mg | Freq: Once | INTRAMUSCULAR | Status: DC | PRN

## 2020-07-24 MED ORDER — METOCLOPRAMIDE HCL 5 MG PO TABS: 5.0000 mg | ORAL_TABLET | Freq: Three times a day (TID) | ORAL | Status: DC | PRN

## 2020-07-24 MED ORDER — CEFAZOLIN SODIUM-DEXTROSE 2-4 GM/100ML-% IV SOLN
2.0000 g | INTRAVENOUS | Status: AC
  Administered 2020-07-24: 2 g via INTRAVENOUS
  Filled 2020-07-24: qty 100

## 2020-07-24 MED ORDER — FENTANYL CITRATE (PF) 100 MCG/2ML IJ SOLN
INTRAMUSCULAR | Status: DC | PRN
  Administered 2020-07-24 (×2): 50 ug via INTRAVENOUS

## 2020-07-24 MED ORDER — BUPIVACAINE-EPINEPHRINE 0.25% -1:200000 IJ SOLN
INTRAMUSCULAR | Status: DC | PRN
  Administered 2020-07-24: 30 mL

## 2020-07-24 MED ORDER — AZELASTINE HCL 0.1 % NA SOLN
1.0000 | Freq: Two times a day (BID) | NASAL | Status: DC
  Administered 2020-07-24 – 2020-07-26 (×3): 1 via NASAL
  Filled 2020-07-24: qty 30

## 2020-07-24 MED ORDER — OXYCODONE HCL 5 MG PO TABS
5.0000 mg | ORAL_TABLET | Freq: Once | ORAL | Status: DC | PRN
Start: 2020-07-24 — End: 2020-07-24

## 2020-07-24 MED ORDER — TRAZODONE HCL 50 MG PO TABS
50.0000 mg | ORAL_TABLET | Freq: Every day | ORAL | Status: DC
  Administered 2020-07-24 – 2020-07-25 (×2): 50 mg via ORAL
  Filled 2020-07-24 (×2): qty 1

## 2020-07-24 MED ORDER — SODIUM CHLORIDE 0.9 % IV SOLN: INTRAVENOUS | Status: DC

## 2020-07-24 MED ORDER — METHOCARBAMOL 500 MG PO TABS
500.0000 mg | ORAL_TABLET | Freq: Four times a day (QID) | ORAL | Status: DC | PRN
  Administered 2020-07-24 – 2020-07-26 (×2): 500 mg via ORAL
  Filled 2020-07-24 (×2): qty 1

## 2020-07-24 MED ORDER — DEXAMETHASONE SODIUM PHOSPHATE 10 MG/ML IJ SOLN
10.0000 mg | Freq: Once | INTRAMUSCULAR | Status: AC
  Administered 2020-07-25: 10 mg via INTRAVENOUS
  Filled 2020-07-24: qty 1

## 2020-07-24 MED ORDER — BUPIVACAINE IN DEXTROSE 0.75-8.25 % IT SOLN
INTRATHECAL | Status: DC | PRN
  Administered 2020-07-24: 1.6 mL via INTRATHECAL

## 2020-07-24 MED ORDER — MIDAZOLAM HCL 2 MG/2ML IJ SOLN
INTRAMUSCULAR | Status: AC
  Filled 2020-07-24: qty 2

## 2020-07-24 MED ORDER — CHLORHEXIDINE GLUCONATE 0.12 % MT SOLN
15.0000 mL | Freq: Once | OROMUCOSAL | Status: AC
  Administered 2020-07-24: 15 mL via OROMUCOSAL

## 2020-07-24 MED ORDER — METFORMIN HCL 500 MG PO TABS
500.0000 mg | ORAL_TABLET | Freq: Two times a day (BID) | ORAL | Status: DC
  Administered 2020-07-25 – 2020-07-26 (×3): 500 mg via ORAL
  Filled 2020-07-24 (×3): qty 1

## 2020-07-24 MED ORDER — SODIUM CHLORIDE (PF) 0.9 % IJ SOLN
INTRAMUSCULAR | Status: DC | PRN
  Administered 2020-07-24: 30 mL

## 2020-07-24 MED ORDER — ATORVASTATIN CALCIUM 10 MG PO TABS
10.0000 mg | ORAL_TABLET | Freq: Every morning | ORAL | Status: DC
  Administered 2020-07-25 – 2020-07-26 (×2): 10 mg via ORAL
  Filled 2020-07-24 (×2): qty 1

## 2020-07-24 MED ORDER — KETOROLAC TROMETHAMINE 30 MG/ML IJ SOLN
INTRAMUSCULAR | Status: DC | PRN
  Administered 2020-07-24: 30 mg via INTRA_ARTICULAR

## 2020-07-24 MED ORDER — ACETAMINOPHEN 10 MG/ML IV SOLN
1000.0000 mg | Freq: Once | INTRAVENOUS | Status: AC
  Administered 2020-07-24: 1000 mg via INTRAVENOUS
  Filled 2020-07-24: qty 100

## 2020-07-24 MED ORDER — OXYCODONE HCL 5 MG PO TABS
10.0000 mg | ORAL_TABLET | ORAL | Status: DC | PRN
  Administered 2020-07-24 – 2020-07-25 (×2): 10 mg via ORAL
  Filled 2020-07-24 (×2): qty 2

## 2020-07-24 MED ORDER — MECLIZINE HCL 25 MG PO TABS
25.0000 mg | ORAL_TABLET | Freq: Three times a day (TID) | ORAL | Status: DC
  Administered 2020-07-24 – 2020-07-26 (×6): 25 mg via ORAL
  Filled 2020-07-24 (×6): qty 1

## 2020-07-24 MED ORDER — LIDOCAINE 2% (20 MG/ML) 5 ML SYRINGE
INTRAMUSCULAR | Status: AC
  Filled 2020-07-24: qty 5

## 2020-07-24 MED ORDER — ALUM & MAG HYDROXIDE-SIMETH 200-200-20 MG/5ML PO SUSP: 30.0000 mL | ORAL | Status: DC | PRN

## 2020-07-24 MED ORDER — SENNA 8.6 MG PO TABS
1.0000 | ORAL_TABLET | Freq: Two times a day (BID) | ORAL | Status: DC
  Administered 2020-07-24 – 2020-07-26 (×5): 8.6 mg via ORAL
  Filled 2020-07-24 (×5): qty 1

## 2020-07-24 MED ORDER — METOCLOPRAMIDE HCL 5 MG/ML IJ SOLN: 5.0000 mg | Freq: Three times a day (TID) | INTRAMUSCULAR | Status: DC | PRN

## 2020-07-24 MED ORDER — FENTANYL CITRATE (PF) 100 MCG/2ML IJ SOLN
INTRAMUSCULAR | Status: AC
  Filled 2020-07-24: qty 2

## 2020-07-24 MED ORDER — PROPOFOL 10 MG/ML IV BOLUS
INTRAVENOUS | Status: AC
  Filled 2020-07-24: qty 20

## 2020-07-24 MED ORDER — TRANEXAMIC ACID-NACL 1000-0.7 MG/100ML-% IV SOLN
1000.0000 mg | INTRAVENOUS | Status: AC
  Administered 2020-07-24: 1000 mg via INTRAVENOUS
  Filled 2020-07-24: qty 100

## 2020-07-24 MED ORDER — HYDROMORPHONE HCL 1 MG/ML IJ SOLN: 0.5000 mg | INTRAMUSCULAR | Status: DC | PRN

## 2020-07-24 MED ORDER — MENTHOL 3 MG MT LOZG: 1.0000 | LOZENGE | OROMUCOSAL | Status: DC | PRN

## 2020-07-24 MED ORDER — OXYCODONE HCL 5 MG PO TABS
5.0000 mg | ORAL_TABLET | ORAL | Status: DC | PRN
  Administered 2020-07-24 – 2020-07-26 (×4): 10 mg via ORAL
  Filled 2020-07-24 (×4): qty 2

## 2020-07-24 MED ORDER — LISINOPRIL 20 MG PO TABS
40.0000 mg | ORAL_TABLET | Freq: Every day | ORAL | Status: DC
  Administered 2020-07-24 – 2020-07-26 (×3): 40 mg via ORAL
  Filled 2020-07-24 (×3): qty 2

## 2020-07-24 SURGICAL SUPPLY — 74 items
BAG COUNTER SPONGE SURGICOUNT (BAG) IMPLANT
BAG ZIPLOCK 12X15 (MISCELLANEOUS) IMPLANT
BATTERY INSTRU NAVIGATION (MISCELLANEOUS) ×6 IMPLANT
BLADE SAW RECIPROCATING 77.5 (BLADE) ×2 IMPLANT
BNDG ELASTIC 4X5.8 VLCR STR LF (GAUZE/BANDAGES/DRESSINGS) ×2 IMPLANT
BNDG ELASTIC 6X10 VLCR STRL LF (GAUZE/BANDAGES/DRESSINGS) ×2 IMPLANT
BNDG ELASTIC 6X5.8 VLCR STR LF (GAUZE/BANDAGES/DRESSINGS) ×2 IMPLANT
BONE CEMENT GENTAMICIN (Cement) ×2 IMPLANT
BOWL SMART MIX CTS (DISPOSABLE) ×2 IMPLANT
CEMENT BONE GENTAMICIN 40 (Cement) ×1 IMPLANT
CHLORAPREP W/TINT 26 (MISCELLANEOUS) ×4 IMPLANT
COMP FEM CR ATTUNE 4 RT (Joint) ×2 IMPLANT
COMPONENT FEM CR ATTUNE 4 RT (Joint) ×1 IMPLANT
COVER SURGICAL LIGHT HANDLE (MISCELLANEOUS) ×2 IMPLANT
CUFF TOURN SGL QUICK 34 (TOURNIQUET CUFF) ×2
CUFF TRNQT CYL 34X4.125X (TOURNIQUET CUFF) ×1 IMPLANT
DECANTER SPIKE VIAL GLASS SM (MISCELLANEOUS) ×4 IMPLANT
DERMABOND ADVANCED (GAUZE/BANDAGES/DRESSINGS) ×1
DERMABOND ADVANCED .7 DNX12 (GAUZE/BANDAGES/DRESSINGS) ×1 IMPLANT
DRAPE SHEET LG 3/4 BI-LAMINATE (DRAPES) ×6 IMPLANT
DRAPE U-SHAPE 47X51 STRL (DRAPES) ×2 IMPLANT
DRSG AQUACEL AG ADV 3.5X10 (GAUZE/BANDAGES/DRESSINGS) ×2 IMPLANT
DRSG TEGADERM 4X4.75 (GAUZE/BANDAGES/DRESSINGS) IMPLANT
ELECT BLADE TIP CTD 4 INCH (ELECTRODE) ×2 IMPLANT
ELECT REM PT RETURN 15FT ADLT (MISCELLANEOUS) ×2 IMPLANT
EVACUATOR 1/8 PVC DRAIN (DRAIN) IMPLANT
GAUZE SPONGE 4X4 12PLY STRL (GAUZE/BANDAGES/DRESSINGS) ×2 IMPLANT
GLOVE SRG 8 PF TXTR STRL LF DI (GLOVE) ×2 IMPLANT
GLOVE SURG ENC MOIS LTX SZ8.5 (GLOVE) ×4 IMPLANT
GLOVE SURG ENC TEXT LTX SZ7.5 (GLOVE) ×6 IMPLANT
GLOVE SURG UNDER POLY LF SZ8 (GLOVE) ×4
GLOVE SURG UNDER POLY LF SZ8.5 (GLOVE) ×2 IMPLANT
GOWN SPEC L3 XXLG W/TWL (GOWN DISPOSABLE) ×2 IMPLANT
GOWN SPEC L4 XLG W/TWL (GOWN DISPOSABLE) ×2 IMPLANT
HANDPIECE INTERPULSE COAX TIP (DISPOSABLE) ×2
HOLDER FOLEY CATH W/STRAP (MISCELLANEOUS) ×2 IMPLANT
HOOD PEEL AWAY FLYTE STAYCOOL (MISCELLANEOUS) ×6 IMPLANT
INSERT TIB BEAR FX ATTUNE 4X8 (Insert) ×2 IMPLANT
INSERT TIB CMT ATTUNE 4 (Insert) ×2 IMPLANT
JET LAVAGE IRRISEPT WOUND (IRRIGATION / IRRIGATOR)
KIT TURNOVER KIT A (KITS) ×2 IMPLANT
LAVAGE JET IRRISEPT WOUND (IRRIGATION / IRRIGATOR) IMPLANT
MARKER SKIN DUAL TIP RULER LAB (MISCELLANEOUS) ×2 IMPLANT
NDL SAFETY ECLIPSE 18X1.5 (NEEDLE) ×1 IMPLANT
NEEDLE HYPO 18GX1.5 SHARP (NEEDLE) ×2
NEEDLE SPNL 18GX3.5 QUINCKE PK (NEEDLE) ×2 IMPLANT
NS IRRIG 1000ML POUR BTL (IV SOLUTION) ×2 IMPLANT
PACK TOTAL KNEE CUSTOM (KITS) ×2 IMPLANT
PADDING CAST COTTON 6X4 STRL (CAST SUPPLIES) ×2 IMPLANT
PATELLA MEDIAL ATTUN 35MM KNEE (Knees) ×2 IMPLANT
PENCIL SMOKE EVACUATOR (MISCELLANEOUS) IMPLANT
PIN DRILL FIX HALF THREAD (BIT) ×2 IMPLANT
PIN STEINMAN FIXATION KNEE (PIN) ×2 IMPLANT
PROTECTOR NERVE ULNAR (MISCELLANEOUS) ×2 IMPLANT
SAW OSC TIP CART 19.5X105X1.3 (SAW) ×2 IMPLANT
SEALER BIPOLAR AQUA 6.0 (INSTRUMENTS) ×2 IMPLANT
SET HNDPC FAN SPRY TIP SCT (DISPOSABLE) ×1 IMPLANT
SET PAD KNEE POSITIONER (MISCELLANEOUS) ×2 IMPLANT
SPONGE DRAIN TRACH 4X4 STRL 2S (GAUZE/BANDAGES/DRESSINGS) IMPLANT
SUT MNCRL AB 3-0 PS2 18 (SUTURE) ×2 IMPLANT
SUT MNCRL AB 4-0 PS2 18 (SUTURE) ×2 IMPLANT
SUT MON AB 2-0 CT1 36 (SUTURE) ×2 IMPLANT
SUT STRATAFIX PDO 1 14 VIOLET (SUTURE) ×2
SUT STRATFX PDO 1 14 VIOLET (SUTURE) ×1
SUT VIC AB 1 CTX 36 (SUTURE) ×4
SUT VIC AB 1 CTX36XBRD ANBCTR (SUTURE) ×2 IMPLANT
SUT VIC AB 2-0 CT1 27 (SUTURE) ×2
SUT VIC AB 2-0 CT1 TAPERPNT 27 (SUTURE) ×1 IMPLANT
SUTURE STRATFX PDO 1 14 VIOLET (SUTURE) ×1 IMPLANT
SYR 3ML LL SCALE MARK (SYRINGE) ×2 IMPLANT
TOWER CARTRIDGE SMART MIX (DISPOSABLE) IMPLANT
TRAY FOLEY MTR SLVR 16FR STAT (SET/KITS/TRAYS/PACK) IMPLANT
TUBE SUCTION HIGH CAP CLEAR NV (SUCTIONS) ×2 IMPLANT
WATER STERILE IRR 1000ML POUR (IV SOLUTION) ×4 IMPLANT

## 2020-07-24 NOTE — Discharge Instructions (Signed)
 Dr. Lilliane Sposito Total Joint Specialist [facility] [address] [place], [place] [address] [phone]  TOTAL KNEE REPLACEMENT POSTOPERATIVE DIRECTIONS    Knee Rehabilitation, Guidelines Following Surgery  Results after knee surgery are often greatly improved when you follow the exercise, range of motion and muscle strengthening exercises prescribed by your doctor. Safety measures are also important to protect the knee from further injury. Any time any of these exercises cause you to have increased pain or swelling in your knee joint, decrease the amount until you are comfortable again and slowly increase them. If you have problems or questions, call your caregiver or physical therapist for advice.   WEIGHT BEARING Weight bearing as tolerated with assist device (walker, cane, etc) as directed, use it as long as suggested by your surgeon or therapist, typically at least 4-6 weeks.  HOME CARE INSTRUCTIONS  Remove items at home which could result in a fall. This includes throw rugs or furniture in walking pathways.  Continue medications as instructed at time of discharge. You may have some home medications which will be placed on hold until you complete the course of blood thinner medication.  You may start showering once you are discharged home but do not submerge the incision under water. Just pat the incision dry and apply a dry gauze dressing on daily. Walk with walker as instructed.  You may resume a sexual relationship in one month or when given the OK by your doctor.  Use walker as long as suggested by your caregivers. Avoid periods of inactivity such as sitting longer than an hour when not asleep. This helps prevent blood clots.  You may put full weight on your legs and walk as much as is comfortable.  You may return to work once you are cleared by your doctor.  Do not drive a car for 6 weeks or until released by you surgeon.  Do not drive while  taking narcotics.  Wear the elastic stockings for three weeks following surgery during the day but you may remove then at night. Make sure you keep all of your appointments after your operation with all of your doctors and caregivers. You should call the office at the above phone number and make an appointment for approximately two weeks after the date of your surgery. Do not remove your surgical dressing. The dressing is waterproof; you may take showers in 3 days, but do not take tub baths or submerge the dressing. Please pick up a stool softener and laxative for home use as long as you are requiring pain medications. ICE to the affected knee every three hours for 30 minutes at a time and then as needed for pain and swelling.  Continue to use ice on the knee for pain and swelling from surgery. You may notice swelling that will progress down to the foot and ankle.  This is normal after surgery.  Elevate the leg when you are not up walking on it.   It is important for you to complete the blood thinner medication as prescribed by your doctor. Continue to use the breathing machine which will help keep your temperature down.  It is common for your temperature to cycle up and down following surgery, especially at night when you are not up moving around and exerting yourself.  The breathing machine keeps your lungs expanded and your temperature down.  RANGE OF MOTION AND STRENGTHENING EXERCISES  Rehabilitation of the knee is important following a knee injury or an   operation. After just a few days of immobilization, the muscles of the thigh which control the knee become weakened and shrink (atrophy). Knee exercises are designed to build up the tone and strength of the thigh muscles and to improve knee motion. Often times heat used for twenty to thirty minutes before working out will loosen up your tissues and help with improving the range of motion but do not use heat for the first two weeks following surgery.  These exercises can be done on a training (exercise) mat, on the floor, on a table or on a bed. Use what ever works the best and is most comfortable for you Knee exercises include:  Leg Lifts - While your knee is still immobilized in a splint or cast, you can do straight leg raises. Lift the leg to 60 degrees, hold for 3 sec, and slowly lower the leg. Repeat 10-20 times 2-3 times daily. Perform this exercise against resistance later as your knee gets better.  Quad and Hamstring Sets - Tighten up the muscle on the front of the thigh (Quad) and hold for 5-10 sec. Repeat this 10-20 times hourly. Hamstring sets are done by pushing the foot backward against an object and holding for 5-10 sec. Repeat as with quad sets.  A rehabilitation program following serious knee injuries can speed recovery and prevent re-injury in the future due to weakened muscles. Contact your doctor or a physical therapist for more information on knee rehabilitation.   POST-OPERATIVE OPIOID TAPER INSTRUCTIONS: It is important to wean off of your opioid medication as soon as possible. If you do not need pain medication after your surgery it is ok to stop day one. Opioids include: Codeine, Hydrocodone(Norco, Vicodin), Oxycodone(Percocet, oxycontin) and hydromorphone amongst others.  Long term and even short term use of opiods can cause: Increased pain response Dependence Constipation Depression Respiratory depression And more.  Withdrawal symptoms can include Flu like symptoms Nausea, vomiting And more Techniques to manage these symptoms Hydrate well Eat regular healthy meals Stay active Use relaxation techniques(deep breathing, meditating, yoga) Do Not substitute Alcohol to help with tapering If you have been on opioids for less than two weeks and do not have pain than it is ok to stop all together.  Plan to wean off of opioids This plan should start within one week post op of your joint replacement. Maintain the same  interval or time between taking each dose and first decrease the dose.  Cut the total daily intake of opioids by one tablet each day Next start to increase the time between doses. The last dose that should be eliminated is the evening dose.    SKILLED REHAB INSTRUCTIONS: If the patient is transferred to a skilled rehab facility following release from the hospital, a list of the current medications will be sent to the facility for the patient to continue.  When discharged from the skilled rehab facility, please have the facility set up the patient's Home Health Physical Therapy prior to being released. Also, the skilled facility will be responsible for providing the patient with their medications at time of release from the facility to include their pain medication, the muscle relaxants, and their blood thinner medication. If the patient is still at the rehab facility at time of the two week follow up appointment, the skilled rehab facility will also need to assist the patient in arranging follow up appointment in our office and any transportation needs.  MAKE SURE YOU:  Understand these instructions.  Will watch   your condition.  Will get help right away if you are not doing well or get worse.    Pick up stool softner and laxative for home use following surgery while on pain medications. Do NOT remove your dressing. You may shower.  Do not take tub baths or submerge incision under water. May shower starting three days after surgery. Please use a clean towel to pat the incision dry following showers. Continue to use ice for pain and swelling after surgery. Do not use any lotions or creams on the incision until instructed by your surgeon.  

## 2020-07-24 NOTE — Care Plan (Signed)
Ortho Bundle Case Management Note  Patient Details  Name: KAYONNA LAWNICZAK MRN: [id number] Date of Birth: 04/22/1935                  R TKA on 07-24-20 DCP: Home with niece and brother. 1 story home with 0 ste. DME: RW and 3-in-1 ordered through [employer] PT: EO. PT eval to be scheduled on 07-29-20.   DME Arranged:  3-N-1, Walker rolling DME Agency:  [employer]  HH Arranged:    HH Agency:     Additional Comments: Please contact me with any questions of if this plan should need to change.  Ennis Forts, RN,CCM [facility]  [phone] 07/24/2020, 8:29 AM

## 2020-07-24 NOTE — Evaluation (Signed)
Physical Therapy Evaluation Patient Details Name: Casey [name] MRN: [id number] DOB: March 24, 1935 Today's Date: 07/24/2020   History of Present Illness  85 yo female s/p R TKA 07/24/20. Hx of memory impairment/dementia, DM, neuropathy  Clinical Impression  On eval POD 0, pt required Min assist for mobility. She walked ~55 feet with a RW. Moderate pain with activity. No c/o dizziness. Will plan to follow and progress activity as tolerated. Plan is for possible d/c home on tomorrow.     Follow Up Recommendations Follow surgeon's recommendation for DC plan and follow-up therapies; 24 hour supervision/assist    Equipment Recommendations  Rolling walker with 5" wheels;3in1 (PT)    Recommendations for Other Services       Precautions / Restrictions Precautions Precautions: Fall;Knee Restrictions Weight Bearing Restrictions: No RLE Weight Bearing: Weight bearing as tolerated      Mobility  Bed Mobility Overal bed mobility: Needs Assistance Bed Mobility: Supine to Sit     Supine to sit: Min assist     General bed mobility comments: Assist for R LE. Increased time. Min cues provided.    Transfers Overall transfer level: Needs assistance Equipment used: Rolling walker (2 wheeled) Transfers: Sit to/from Stand Sit to Stand: Min assist;From elevated surface         General transfer comment: Cues for safety, technique, hand placement. Assist to rise, steady, control descent.  Ambulation/Gait Ambulation/Gait assistance: Min assist Gait Distance (Feet): 55 Feet Assistive device: Rolling walker (2 wheeled) Gait Pattern/deviations: Step-to pattern;Step-through pattern;Decreased stride length     General Gait Details: Cues for safety, RW proximity, sequencing. Assist to stabilize throughout distance. No c/o dizziness.  Stairs            Wheelchair Mobility    Modified Rankin (Stroke Patients Only)       Balance Overall balance assessment: Needs assistance          Standing balance support: Bilateral upper extremity supported Standing balance-Leahy Scale: Poor                               Pertinent Vitals/Pain Pain Assessment: 0-10 Pain Score: 5  Pain Location: R knee Pain Descriptors / Indicators: Discomfort;Sore Pain Intervention(s): Premedicated before session;Ice applied;Repositioned    Home Living Family/patient expects to be discharged to:: Private residence Living Arrangements: Alone Available Help at Discharge: Family (family for a few days?) Type of Home: House Home Access: Stairs to enter Entrance Stairs-Rails: Doctor, general practice of Steps: 2 Home Layout: One level Home Equipment: None      Prior Function Level of Independence: Independent               Hand Dominance        Extremity/Trunk Assessment   Upper Extremity Assessment Upper Extremity Assessment: Generalized weakness    Lower Extremity Assessment Lower Extremity Assessment: Generalized weakness    Cervical / Trunk Assessment Cervical / Trunk Assessment: Normal  Communication   Communication: No difficulties  Cognition Arousal/Alertness: Awake/alert Behavior During Therapy: WFL for tasks assessed/performed Overall Cognitive Status: Within Functional Limits for tasks assessed                                        General Comments      Exercises     Assessment/Plan    PT Assessment Patient needs continued PT services  PT Problem List Decreased strength;Decreased mobility;Decreased range of motion;Decreased activity tolerance;Decreased balance;Decreased knowledge of use of DME;Pain       PT Treatment Interventions DME instruction;Gait training;Therapeutic exercise;Balance training;Stair training;Functional mobility training;Therapeutic activities;Patient/family education    PT Goals (Current goals can be found in the Care Plan section)  Acute Rehab PT Goals Patient Stated Goal: home PT Goal  Formulation: With patient/family Time For Goal Achievement: 08/07/20 Potential to Achieve Goals: Good    Frequency 7X/week   Barriers to discharge        Co-evaluation               AM-PAC PT "6 Clicks" Mobility  Outcome Measure Help needed turning from your back to your side while in a flat bed without using bedrails?: A Little Help needed moving from lying on your back to sitting on the side of a flat bed without using bedrails?: A Little Help needed moving to and from a bed to a chair (including a wheelchair)?: A Little Help needed standing up from a chair using your arms (e.g., wheelchair or bedside chair)?: A Little Help needed to walk in hospital room?: A Little Help needed climbing 3-5 steps with a railing? : A Little 6 Click Score: 18    End of Session Equipment Utilized During Treatment: Gait belt Activity Tolerance: Patient tolerated treatment well Patient left: in chair;with call bell/phone within reach;with family/visitor present   PT Visit Diagnosis: Other abnormalities of gait and mobility (R26.89);Pain Pain - Right/Left: Right Pain - part of body: Knee    Time: 3299-2426 PT Time Calculation (min) (ACUTE ONLY): 24 min   Charges:   PT Evaluation $PT Eval Low Complexity: 1 Low PT Treatments $Gait Training: 8-22 mins         Faye Ramsay, PT Acute Rehabilitation  Office: [phone] Pager: [phone]

## 2020-07-24 NOTE — Anesthesia Postprocedure Evaluation (Signed)
Anesthesia Post Note  Patient: Casey [name]  Procedure(s) Performed: COMPUTER ASSISTED TOTAL KNEE ARTHROPLASTY (Right: Knee)     Patient location during evaluation: PACU Anesthesia Type: Spinal Level of consciousness: oriented and awake and alert Pain management: pain level controlled Vital Signs Assessment: post-procedure vital signs reviewed and stable Respiratory status: spontaneous breathing, respiratory function stable and nonlabored ventilation Cardiovascular status: blood pressure returned to baseline and stable Postop Assessment: no headache, no backache, no apparent nausea or vomiting, spinal receding and patient able to bend at knees Anesthetic complications: no   No notable events documented.  Last Vitals:  Vitals:   07/24/20 1132 07/24/20 1145  BP: 112/60 128/66  Pulse: 73 73  Resp: 17 13  Temp: 36.8 C   SpO2: 99% 97%    Last Pain:  Vitals:   07/24/20 1145  TempSrc:   PainSc: 0-No pain    LLE Motor Response: Purposeful movement (07/24/20 1145)   RLE Motor Response: Purposeful movement (07/24/20 1145)   L Sensory Level: S1-Sole of foot, small toes (07/24/20 1145) R Sensory Level: S1-Sole of foot, small toes (07/24/20 1145)  Francies Inch A.

## 2020-07-24 NOTE — Anesthesia Procedure Notes (Addendum)
  Anesthesia Regional Block: Adductor canal block   Pre-Anesthetic Checklist: , timeout performed,  Correct Patient, Correct Site, Correct Laterality,  Correct Procedure, Correct Position, site marked,  Risks and benefits discussed,  Surgical consent,  Pre-op evaluation,  At surgeon's request and post-op pain management  Laterality: Right  Prep: chloraprep       Needles:  Injection technique: Single-shot  Needle Type: Echogenic Stimulator Needle     Needle Length: 10cm  Needle Gauge: 21     Additional Needles:   Procedures:,,,, ultrasound used (permanent image in chart),,    Narrative:  Start time: 07/24/2020 7:57 AM End time: 07/24/2020 8:02 AM Injection made incrementally with aspirations every 5 mL.  Performed by: Personally  Anesthesiologist: Mal Amabile, MD  Additional Notes: Timeout performed. Patient sedated. Relevant anatomy ID'd using [name]. Incremental 2-72ml injection of LA with frequent aspiration. Patient tolerated procedure well.

## 2020-07-24 NOTE — Transfer of Care (Signed)
Immediate Anesthesia Transfer of Care Note  Patient: Casey [name]  Procedure(s) Performed: COMPUTER ASSISTED TOTAL KNEE ARTHROPLASTY (Right: Knee)  Patient Location: PACU  Anesthesia Type:Spinal  Level of Consciousness: awake and drowsy  Airway & Oxygen Therapy: Patient Spontanous Breathing and Patient connected to face mask oxygen  Post-op Assessment: Report given to RN and Post -op Vital signs reviewed and stable  Post vital signs: Reviewed and stable  Last Vitals:  Vitals Value Taken Time  BP 112/60 07/24/20 1132  Temp    Pulse 77 07/24/20 1135  Resp 19 07/24/20 1135  SpO2 100 % 07/24/20 1135  Vitals shown include unvalidated device data.  Last Pain:  Vitals:   07/24/20 0654  TempSrc:   PainSc: 0-No pain         Complications: No notable events documented.

## 2020-07-24 NOTE — Anesthesia Procedure Notes (Signed)
Spinal  Patient location during procedure: OR Start time: 07/24/2020 8:52 AM End time: 07/24/2020 8:58 AM Reason for block: surgical anesthesia Staffing Performed: anesthesiologist and resident/CRNA  Anesthesiologist: Mal Amabile, MD Resident/CRNA: [place], Stephanie C, CRNA Preanesthetic Checklist Completed: patient identified, IV checked, site marked, risks and benefits discussed, surgical consent, monitors and equipment checked, pre-op evaluation and timeout performed Spinal Block Patient position: sitting Prep: DuraPrep and site prepped and draped Patient monitoring: heart rate, cardiac monitor, continuous pulse ox and blood pressure Approach: midline Location: L3-4 Injection technique: single-shot Needle Needle type: Pencan  Needle gauge: 24 G Needle length: 9 cm Needle insertion depth: 6 cm Assessment Sensory level: T6 Events: CSF return and second provider Additional Notes Patient tolerated procedure well. Attempt by CRNA unsuccessful. Adequate sensory level.

## 2020-07-24 NOTE — Op Note (Signed)
OPERATIVE REPORT  SURGEON: Rod Can, MD   ASSISTANT: Cherlynn June, Pa-C.  PREOPERATIVE DIAGNOSIS: Right knee arthritis.   POSTOPERATIVE DIAGNOSIS: Right knee arthritis.   PROCEDURE: Right total knee arthroplasty.   IMPLANTS: DePuy Attune CR cementless femur, size 4. Attune fixed bearing Affixium tibia, size 4. Aox polyethelyene insert, size 8 mm, CR. 3 button asymmetric patella, size 35 mm. GHV cement.  ANESTHESIA:  MAC and Spinal  TOURNIQUET TIME: not utilized.  ESTIMATED BLOOD LOSS: -100 mL  ANTIBIOTICS: 2g Ancef.  DRAINS: None.  COMPLICATIONS: None   CONDITION: PACU - hemodynamically stable.   BRIEF CLINICAL NOTE: Casey [name] is a 85 y.o. female with a long-standing history of Right knee arthritis. After failing conservative management, the patient was indicated for total knee arthroplasty. The risks, benefits, and alternatives to the procedure were explained, and the patient elected to proceed.  PROCEDURE IN DETAIL: Regional block was obtained in the pre-op holding area. Once inside the operative room, spinal anesthesia was obtained, and a foley catheter was inserted. The patient was then positioned, and the lower extremity was prepped and draped in the normal sterile surgical fashion.  A tourniquet was not applied. A time-out was called verifying side and site of surgery. The patient received IV antibiotics within 60 minutes of beginning the procedure.    An anterior approach to the knee was performed utilizing a midvastus arthrotomy. A medial release was performed and the patellar fat pad was excised. Stryker imageless navigation was used to cut the distal femur perpendicular to the mechanical axis. A freehand patellar resection was performed, and the patella was sized an prepared with 3 lug holes.  Nagivation was used to make a neutral proximal tibia resection, taking 3 mm of bone from the less affected medial side with 3 degrees of slope. The menisci were  excised. A spacer block was placed, and the alignment and balance in extension were confirmed.   The distal femur was sized using the 3-degree external rotation guide referencing the posterior femoral cortex. The appropriate 4-in-1 cutting block was pinned into place. Rotation was checked using Whiteside's line, the epicondylar axis, and then confirmed with a spacer block in flexion. The remaining femoral cuts were performed, taking care to protect the MCL.  The tibia was sized and the trial tray was pinned into place. The remaining trail components were inserted. The knee was stable to varus and valgus stress through a full range of motion. The patella tracked centrally, and the PCL was well balanced. The trial components were removed, and the proximal tibial surface was prepared. The cut bony surfaces were irrigated with pulse lavage. Final tibial and femoral components were impacted into place. The final insert was placed, and the knee was brought into extension. Patellar component was cemented into place, and excess cement was cleared. Once the cement was hard, the knee was tested for a final time and found to be well balanced.    The wound was copiously irrigated with Irrisept followed by normal saline with pulse lavage.  Marcaine solution was injected into the periarticular soft tissue.  The wound was closed in layers using #1 Vicryl and Stratafix for the fascia, 2-0 Vicryl for the subcutaneous fat, 2-0 Monocryl for the deep dermal layer, 3-0 running Monocryl subcuticular Stitch, and Dermabond for the skin.  Once the glue was fully dried, an Aquacell Ag and compressive dressing were applied.  The patient was aroused from anesthesia, and the patient was transported to the recovery room in stable  ondition.  Sponge, needle, and instrument counts were correct at the end of the case x2.  The patient tolerated the procedure well and there were no known complications.  Please note that a surgical assistant  was a medical necessity for this procedure in order to perform it in a safe and expeditious manner. Surgical assistant was necessary to retract the ligaments and vital neurovascular structures to prevent injury to them and also necessary for proper positioning of the limb to allow for anatomic placement of the prosthesis.

## 2020-07-24 NOTE — Interval H&P Note (Signed)
History and Physical Interval Note:  07/24/2020 8:40 AM  Casey [name]  has presented today for surgery, with the diagnosis of RIGHT KNEE OSTEOARTHRITIS.  The various methods of treatment have been discussed with the patient and family. After consideration of risks, benefits and other options for treatment, the patient has consented to  Procedure(s): COMPUTER ASSISTED TOTAL KNEE ARTHROPLASTY (Right) as a surgical intervention.  The patient's history has been reviewed, patient examined, no change in status, stable for surgery.  I have reviewed the patient's chart and labs.  Questions were answered to the patient's satisfaction.    The risks, benefits, and alternatives were discussed with the patient. There are risks associated with the surgery including, but not limited to, problems with anesthesia (death), infection, instability (giving out of the joint), dislocation, differences in leg length/angulation/rotation, fracture of bones, loosening or failure of implants, hematoma (blood accumulation) which may require surgical drainage, blood clots, pulmonary embolism, nerve injury (foot drop and lateral thigh numbness), and blood vessel injury. The patient understands these risks and elects to proceed.    Iline Oven Niang [place]

## 2020-07-25 DIAGNOSIS — M1711 Unilateral primary osteoarthritis, right knee: Secondary | ICD-10-CM | POA: Diagnosis not present

## 2020-07-25 LAB — CBC
HCT: 30.6 % — ABNORMAL LOW (ref 36.0–46.0)
Hemoglobin: 9.7 g/dL — ABNORMAL LOW (ref 12.0–15.0)
MCH: 27.6 pg (ref 26.0–34.0)
MCHC: 31.7 g/dL (ref 30.0–36.0)
MCV: 86.9 fL (ref 80.0–100.0)
Platelets: 269 10*3/uL (ref 150–400)
RBC: 3.52 MIL/uL — ABNORMAL LOW (ref 3.87–5.11)
RDW: 15.2 % (ref 11.5–15.5)
WBC: 10.6 10*3/uL — ABNORMAL HIGH (ref 4.0–10.5)
nRBC: 0 % (ref 0.0–0.2)

## 2020-07-25 LAB — BASIC METABOLIC PANEL
Anion gap: 7 (ref 5–15)
BUN: 15 mg/dL (ref 8–23)
CO2: 29 mmol/L (ref 22–32)
Calcium: 8.1 mg/dL — ABNORMAL LOW (ref 8.9–10.3)
Chloride: 103 mmol/L (ref 98–111)
Creatinine, Ser: 0.82 mg/dL (ref 0.44–1.00)
GFR, Estimated: >60 mL/min (ref >60)
Glucose, Bld: 154 mg/dL — ABNORMAL HIGH (ref 70–99)
Potassium: 4.1 mmol/L (ref 3.5–5.1)
Sodium: 139 mmol/L (ref 135–145)

## 2020-07-25 MED ORDER — DOCUSATE SODIUM 100 MG PO CAPS: 100.0000 mg | ORAL_CAPSULE | Freq: Two times a day (BID) | ORAL | 0 refills | Status: DC

## 2020-07-25 MED ORDER — CELECOXIB 200 MG PO CAPS: 200.0000 mg | ORAL_CAPSULE | Freq: Every day | ORAL | 0 refills | Status: AC

## 2020-07-25 MED ORDER — SENNA 8.6 MG PO TABS: 1.0000 | ORAL_TABLET | Freq: Two times a day (BID) | ORAL | 0 refills | Status: DC

## 2020-07-25 MED ORDER — ASPIRIN 81 MG PO CHEW: 81.0000 mg | CHEWABLE_TABLET | Freq: Two times a day (BID) | ORAL | Status: DC

## 2020-07-25 MED ORDER — OXYCODONE HCL 5 MG PO TABS: 5.0000 mg | ORAL_TABLET | ORAL | 0 refills | Status: DC | PRN

## 2020-07-25 MED ORDER — ONDANSETRON HCL 4 MG PO TABS: 4.0000 mg | ORAL_TABLET | Freq: Four times a day (QID) | ORAL | 0 refills | Status: DC | PRN

## 2020-07-25 NOTE — Progress Notes (Signed)
Physical Therapy Treatment Patient Details Name: Casey [name] MRN: [id number] DOB: 08/24/1935 Today's Date: 07/25/2020    History of Present Illness 85 yo female s/p R TKA 07/24/20. Hx of memory impairment/dementia, DM, neuropathy    PT Comments     The patient is progressing. Patient reports that she may be alone tonight if DC'd.Spoke to patient's niece who states the son is to arrive this AM, has not yet. Patient will require firm caregiver support at DC. Continue PT.   Follow Up Recommendations  Follow surgeon's recommendation for DC plan and follow-up therapies. Per note to go to [facility] 7/11.     Equipment Recommendations  Rolling walker with 5" wheels;3in1 (PT)    Recommendations for Other Services       Precautions / Restrictions Precautions Precautions: Fall;Knee Restrictions Weight Bearing Restrictions: No RLE Weight Bearing: Weight bearing as tolerated    Mobility  Bed Mobility   Bed Mobility: Sit to Supine     Supine to sit: Min guard Sit to supine: Min assist   General bed mobility comments: support  right leg onto bed    Transfers Overall transfer level: Needs assistance Equipment used: Rolling walker (2 wheeled) Transfers: Sit to/from Stand Sit to Stand: Min guard;From elevated surface;Min assist         General transfer comment: Cues for safety, technique, hand placement.  Recalled hand placement, cues  to reach to [facility] and bed  Ambulation/Gait Ambulation/Gait assistance: Min assist Gait Distance (Feet): 60 Feet (and 15) Assistive device: Rolling walker (2 wheeled) Gait Pattern/deviations: Step-to pattern;Step-through pattern Gait velocity: decr   General Gait Details: Cues for safety, frequent cues to stay inside RW proximity, sequencing. Assist to stabilize throughout distance.   Stairs             Wheelchair Mobility    Modified Rankin (Stroke Patients Only)       Balance Overall balance assessment: Needs assistance    Sitting balance-Leahy Scale: Good     Standing balance support: Single extremity supported;During functional activity Standing balance-Leahy Scale: Fair Standing balance comment: one balance loss while performing standing pericare post void.                            Cognition Arousal/Alertness: Awake/alert Behavior During Therapy: WFL for tasks assessed/performed                                          Exercises    General Comments        Pertinent Vitals/Pain Pain Score: 5  Pain Location: R knee Pain Descriptors / Indicators: Discomfort;Sore Pain Intervention(s): Monitored during session;Premedicated before session    Home Living                      Prior Function            PT Goals (current goals can now be found in the care plan section) Progress towards PT goals: Progressing toward goals    Frequency    7X/week      PT Plan Current plan remains appropriate    Co-evaluation              AM-PAC PT "6 Clicks" Mobility   Outcome Measure  Help needed turning from your back to your side while in a flat bed without using  bedrails?: A Little Help needed moving from lying on your back to sitting on the side of a flat bed without using bedrails?: A Little Help needed moving to and from a bed to a chair (including a wheelchair)?: A Little Help needed standing up from a chair using your arms (e.g., wheelchair or bedside chair)?: A Little Help needed to walk in hospital room?: A Little Help needed climbing 3-5 steps with a railing? : A Little 6 Click Score: 18    End of Session Equipment Utilized During Treatment: Gait belt Activity Tolerance: Patient tolerated treatment well Patient left: in bed;with call bell/phone within reach;with bed alarm set Nurse Communication: Mobility status PT Visit Diagnosis: Other abnormalities of gait and mobility (R26.89);Pain Pain - Right/Left: Right Pain - part of body: Knee      Time: 1206-1231 PT Time Calculation (min) (ACUTE ONLY): 25 min  Charges:  $Gait Training: 8-22 mins  $Self Care/Home Management: 8-22                     Blanchard Kelch PT Acute Rehabilitation Services Pager [phone] Office [phone]    Rada Hay 07/25/2020, 12:58 PM

## 2020-07-25 NOTE — TOC Transition Note (Signed)
Transition of Care [facility]) - CM/SW Discharge Note   Patient Details  Name: Casey [name] MRN: [id number] Date of Birth: Mar 15, 1935  [facility] CM/SW Contact:  Lennart Pall, LCSW Phone Number: 07/25/2020, 10:33 AM   Clinical Narrative:     Met with pt and confirmed DME delivered to room via [place].  Plan for OPPT at [facility].  No further TOC needs.  Final next level of care: OP Rehab Barriers to Discharge: No Barriers Identified   Patient Goals and CMS Choice Patient states their goals for this hospitalization and ongoing recovery are:: return home      Discharge Placement                       Discharge Plan and Services                DME Arranged: 3-N-1, Walker rolling DME Agency: [place]                  Social Determinants of Health (SDOH) Interventions     Readmission Risk Interventions No flowsheet data found.

## 2020-07-25 NOTE — Plan of Care (Signed)
  Problem: Pain Managment: Goal: General experience of comfort will improve Outcome: Progressing   Problem: Safety: Goal: Ability to remain free from injury will improve Outcome: Progressing   

## 2020-07-25 NOTE — Progress Notes (Signed)
Physical Therapy Treatment Patient Details Name: Casey [name] MRN: [id number] DOB: 1935-09-25 Today's Date: 07/25/2020    History of Present Illness 85 yo female s/p R TKA 07/24/20. Hx of memory impairment/dementia, DM, neuropathy    PT Comments    Patient is progressing well. Plans Dc today after second PT if all goes well. \Patient states niece and brother are coming to stay with her. Patient  thinks HHPT is  coming.    Follow Up Recommendations  Follow surgeon's recommendation for DC plan and follow-up therapies (pt states HH- Note from case worker not clear- States: PT Eval 7/11.)     Equipment Recommendations  Rolling walker with 5" wheels;3in1 (PT)    Recommendations for Other Services       Precautions / Restrictions Precautions Precautions: Fall;Knee Restrictions RLE Weight Bearing: Weight bearing as tolerated    Mobility  Bed Mobility   Bed Mobility: Supine to Sit     Supine to sit: Min guard     General bed mobility comments: used belt on foot to move right leg    Transfers Overall transfer level: Needs assistance Equipment used: Rolling walker (2 wheeled) Transfers: Sit to/from Stand Sit to Stand: Min guard;From elevated surface         General transfer comment: Cues for safety, technique, hand placement.  Recalled hand placement, cues  toreach to recliner.  Ambulation/Gait Ambulation/Gait assistance: Min assist Gait Distance (Feet): 45 Feet Assistive device: Rolling walker (2 wheeled) Gait Pattern/deviations: Step-to pattern;Step-through pattern Gait velocity: decr   General Gait Details: Cues for safety, RW proximity, sequencing. Assist to stabilize throughout distance. No c/o dizziness.   Stairs             Wheelchair Mobility    Modified Rankin (Stroke Patients Only)       Balance Overall balance assessment: Needs assistance   Sitting balance-Leahy Scale: Good     Standing balance support: Bilateral upper extremity  supported;During functional activity Standing balance-Leahy Scale: Fair                              Cognition Arousal/Alertness: Awake/alert Behavior During Therapy: WFL for tasks assessed/performed                                          Exercises Total Joint Exercises Ankle Circles/Pumps: AROM;Both;10 reps Quad Sets: AROM;Both;10 reps Heel Slides: AAROM;Left;10 reps Hip ABduction/ADduction: AROM;Left;10 reps Straight Leg Raises: AAROM;Left;10 reps Long Arc Quad: AROM;Left;10 reps Goniometric ROM: 5-70 left knee flex    General Comments        Pertinent Vitals/Pain Pain Score: 7  Pain Location: R knee Pain Descriptors / Indicators: Discomfort;Sore Pain Intervention(s): Monitored during session;Premedicated before session;Ice applied    Home Living                      Prior Function            PT Goals (current goals can now be found in the care plan section) Progress towards PT goals: Progressing toward goals    Frequency    7X/week      PT Plan Current plan remains appropriate    Co-evaluation              AM-PAC PT "6 Clicks" Mobility   Outcome Measure  Help needed turning  from your back to your side while in a flat bed without using bedrails?: A Little Help needed moving from lying on your back to sitting on the side of a flat bed without using bedrails?: A Little Help needed moving to and from a bed to a chair (including a wheelchair)?: A Little Help needed standing up from a chair using your arms (e.g., wheelchair or bedside chair)?: A Little Help needed to walk in hospital room?: A Little Help needed climbing 3-5 steps with a railing? : A Little 6 Click Score: 18    End of Session Equipment Utilized During Treatment: Gait belt Activity Tolerance: Patient tolerated treatment well Patient left: in chair;with call bell/phone within reach;with chair alarm set Nurse Communication: Mobility status PT  Visit Diagnosis: Other abnormalities of gait and mobility (R26.89);Pain Pain - Right/Left: Right Pain - part of body: Knee     Time: 3220-2542 PT Time Calculation (min) (ACUTE ONLY): 35 min  Charges:  $Gait Training: 8-22 mins $Therapeutic Exercise: 8-22 mins                    Blanchard Kelch PT Acute Rehabilitation Services Pager [phone] Office [phone] '   Rada Hay 07/25/2020, 9:06 AM

## 2020-07-25 NOTE — Progress Notes (Signed)
    Subjective:  Patient reports pain as mild to moderate.  Denies N/V/CP/SOB.   Objective:   VITALS:   Vitals:   07/24/20 2147 07/25/20 0144 07/25/20 0536 07/25/20 0916  BP: (!) 131/59 124/64 126/64 (!) 146/78  Pulse: 79 75 77 89  Resp: 19 19 18 16   Temp: 98.9 F (37.2 C) 98 F (36.7 C) 98.4 F (36.9 C) 98.1 F (36.7 C)  TempSrc: Oral Oral Oral Oral  SpO2: 96% 93% 98% 97%  Weight:      Height:        NAD ABD soft Neurovascular intact Sensation intact distally Intact pulses distally Dorsiflexion/Plantar flexion intact Incision: dressing C/D/I   Lab Results  Component Value Date   WBC 10.6 (H) 07/25/2020   HGB 9.7 (L) 07/25/2020   HCT 30.6 (L) 07/25/2020   MCV 86.9 07/25/2020   PLT 269 07/25/2020   BMET    Component Value Date/Time   NA 139 07/25/2020 0317   NA 139 01/03/2020 1528   K 4.1 07/25/2020 0317   CL 103 07/25/2020 0317   CO2 29 07/25/2020 0317   GLUCOSE 154 (H) 07/25/2020 0317   BUN 15 07/25/2020 0317   BUN 12 01/03/2020 1528   CREATININE 0.82 07/25/2020 0317   CREATININE 0.95 (H) 07/02/2015 1154   CALCIUM 8.1 (L) 07/25/2020 0317   GFRNONAA >60 07/25/2020 0317   GFRNONAA 58 (L) 07/02/2015 1154   GFRAA 66 01/03/2020 1528   GFRAA 66 07/02/2015 1154     Assessment/Plan: 1 Day Post-Op   Principal Problem:   Osteoarthritis of right knee   WBAT with walker DVT ppx: Aspirin, SCDs, TEDS PO pain control PT/OT Dispo: D/C home once cleared by therapy.  Outpatient PT starting Monday     Saturday 07/25/2020, 10:26 AM   09/25/2020, MD [phone] [facility] is now [facility]  [place] [address], [place], [place] Phone: [phone] [email or web address] Facebook  [phone]

## 2020-07-26 DIAGNOSIS — M1711 Unilateral primary osteoarthritis, right knee: Secondary | ICD-10-CM | POA: Diagnosis not present

## 2020-07-26 LAB — CBC
HCT: 28.6 % — ABNORMAL LOW (ref 36.0–46.0)
Hemoglobin: 9.2 g/dL — ABNORMAL LOW (ref 12.0–15.0)
MCH: 27.8 pg (ref 26.0–34.0)
MCHC: 32.2 g/dL (ref 30.0–36.0)
MCV: 86.4 fL (ref 80.0–100.0)
Platelets: 239 10*3/uL (ref 150–400)
RBC: 3.31 MIL/uL — ABNORMAL LOW (ref 3.87–5.11)
RDW: 15.5 % (ref 11.5–15.5)
WBC: 11.9 10*3/uL — ABNORMAL HIGH (ref 4.0–10.5)
nRBC: 0 % (ref 0.0–0.2)

## 2020-07-26 NOTE — Progress Notes (Signed)
Discharge package printed and instructions given to pt. Pt wants to go over again with her family when they come.

## 2020-07-26 NOTE — Plan of Care (Signed)
Plan of care reviewed and discussed with the patient. 

## 2020-07-26 NOTE — Progress Notes (Signed)
    Subjective:  Patient reports pain as mild to moderate.  Denies N/V/CP/SOB.   Objective:   VITALS:   Vitals:   07/25/20 0916 07/25/20 1259 07/25/20 2122 07/26/20 0546  BP: (!) 146/78 131/62 140/66 127/65  Pulse: 89 86 87 84  Resp: 16 16 16 16   Temp: 98.1 F (36.7 C) 99.9 F (37.7 C) 98.2 F (36.8 C) 98.4 F (36.9 C)  TempSrc: Oral Oral Oral Oral  SpO2: 97% 94% 96% 96%  Weight:      Height:        NAD ABD soft Neurovascular intact Sensation intact distally Intact pulses distally Dorsiflexion/Plantar flexion intact Incision: dressing C/D/I   Lab Results  Component Value Date   WBC 11.9 (H) 07/26/2020   HGB 9.2 (L) 07/26/2020   HCT 28.6 (L) 07/26/2020   MCV 86.4 07/26/2020   PLT 239 07/26/2020   BMET    Component Value Date/Time   NA 139 07/25/2020 0317   NA 139 01/03/2020 1528   K 4.1 07/25/2020 0317   CL 103 07/25/2020 0317   CO2 29 07/25/2020 0317   GLUCOSE 154 (H) 07/25/2020 0317   BUN 15 07/25/2020 0317   BUN 12 01/03/2020 1528   CREATININE 0.82 07/25/2020 0317   CREATININE 0.95 (H) 07/02/2015 1154   CALCIUM 8.1 (L) 07/25/2020 0317   GFRNONAA >60 07/25/2020 0317   GFRNONAA 58 (L) 07/02/2015 1154   GFRAA 66 01/03/2020 1528   GFRAA 66 07/02/2015 1154     Assessment/Plan: 2 Days Post-Op   Principal Problem:   Osteoarthritis of right knee   WBAT with walker DVT ppx: Aspirin, SCDs, TEDS PO pain control PT/OT Dispo: D/C home today.  Outpatient PT starting Monday     Saturday 07/26/2020, 12:18 PM   09/26/2020, MD [phone] [facility] is now [facility]  [place] [address], [address], [place], [place] Phone: [phone] [email or web address] Facebook  [phone]

## 2020-07-26 NOTE — Plan of Care (Signed)
  Problem: Pain Managment: Goal: General experience of comfort will improve Outcome: Progressing   Problem: Safety: Goal: Ability to remain free from injury will improve Outcome: Progressing   

## 2020-07-26 NOTE — Progress Notes (Signed)
Physical Therapy Treatment Patient Details Name: Casey [name] MRN: [id number] DOB: 1935/05/28 Today's Date: 07/26/2020    History of Present Illness 85 yo female s/p R TKA 07/24/20. Hx of memory impairment/dementia, DM, neuropathy    PT Comments    The patient is progressing well in right knee flexion, strength and mobility. Patient is  ready for DC.   Follow Up Recommendations  Follow surgeon's recommendation for DC plan and follow-up therapies     Equipment Recommendations       Recommendations for Other Services       Precautions / Restrictions Precautions Precautions: Fall;Knee    Mobility  Bed Mobility Overal bed mobility: Needs Assistance Bed Mobility: Sit to Supine     Supine to sit: Supervision          Transfers   Equipment used: Rolling walker (2 wheeled) Transfers: Sit to/from Stand Sit to Stand: From elevated surface;Supervision         General transfer comment: Cues for safety, technique, hand placement.  Recalled hand placement, cues  to reach to [facility] and bed  Ambulation/Gait Ambulation/Gait assistance: Supervision Gait Distance (Feet): 20 Feet (x 2) Assistive device: Rolling walker (2 wheeled) Gait Pattern/deviations: Step-to pattern;Step-through pattern Gait velocity: decr   General Gait Details: Cues for safety, frequent cues to stay inside RW proximity, sequencing. Assist to stabilize throughout distance.   Stairs             Wheelchair Mobility    Modified Rankin (Stroke Patients Only)       Balance Overall balance assessment: Independent   Sitting balance-Leahy Scale: Good     Standing balance support: Single extremity supported;During functional activity Standing balance-Leahy Scale: Fair                              Cognition Arousal/Alertness: Awake/alert Behavior During Therapy: WFL for tasks assessed/performed Overall Cognitive Status: Within Functional Limits for tasks assessed                                         Exercises Total Joint Exercises Quad Sets: AROM;Both;10 reps Heel Slides: AAROM;right;10 reps Hip ABduction/ADduction: AROM;right;10 reps Straight Leg Raises: right;10 reps;AROM Long Arc Quad: AROM;right;10 reps Knee Flexion: AROM;right;10 reps Goniometric ROM: 5-70 rightknee flex    General Comments        Pertinent Vitals/Pain Pain Score: 2  Pain Location: R knee Pain Descriptors / Indicators: Discomfort;Sore Pain Intervention(s): Monitored during session;Premedicated before session    Home Living                      Prior Function            PT Goals (current goals can now be found in the care plan section) Progress towards PT goals: Progressing toward goals    Frequency    7X/week      PT Plan Current plan remains appropriate    Co-evaluation              AM-PAC PT "6 Clicks" Mobility   Outcome Measure  Help needed turning from your back to your side while in a flat bed without using bedrails?: A Little Help needed moving from lying on your back to sitting on the side of a flat bed without using bedrails?: A Little Help needed moving to and  from a bed to a chair (including a wheelchair)?: A Little Help needed standing up from a chair using your arms (e.g., wheelchair or bedside chair)?: A Little Help needed to walk in hospital room?: A Little Help needed climbing 3-5 steps with a railing? : A Little 6 Click Score: 18    End of Session Equipment Utilized During Treatment: Gait belt Activity Tolerance: Patient tolerated treatment well Patient left: with call bell/phone within reach;in chair;with chair alarm set Nurse Communication: Mobility status PT Visit Diagnosis: Other abnormalities of gait and mobility (R26.89);Pain Pain - Right/Left: Left Pain - part of body: Knee     Time: 1109-1140 PT Time Calculation (min) (ACUTE ONLY): 31 min  Charges:  $Gait Training: 8-22 mins $Therapeutic  Exercise: 8-22 mins                    Blanchard Kelch PT Acute Rehabilitation Services Pager [phone] Office [phone]  Rada Hay 07/26/2020, 1:09 PM

## 2020-07-28 LAB — TYPE AND SCREEN
ABO/RH(D): AB POS
Antibody Screen: POSITIVE
Donor AG Type: NEGATIVE
Donor AG Type: NEGATIVE
Unit division: 0
Unit division: 0

## 2020-07-28 LAB — BPAM RBC
Blood Product Expiration Date: 202208012359
Blood Product Expiration Date: 202208042359
Unit Type and Rh: 6200
Unit Type and Rh: 6200

## 2020-08-02 ENCOUNTER — Encounter (HOSPITAL_COMMUNITY): Payer: Self-pay | Admitting: Orthopedic Surgery

## 2020-08-03 ENCOUNTER — Other Ambulatory Visit: Payer: Self-pay | Admitting: Registered Nurse

## 2020-08-03 DIAGNOSIS — I10 Essential (primary) hypertension: Secondary | ICD-10-CM

## 2020-08-14 ENCOUNTER — Other Ambulatory Visit: Payer: Self-pay | Admitting: Neurology

## 2020-08-18 ENCOUNTER — Other Ambulatory Visit: Payer: Self-pay | Admitting: Registered Nurse

## 2020-08-18 DIAGNOSIS — F411 Generalized anxiety disorder: Secondary | ICD-10-CM

## 2020-08-21 ENCOUNTER — Ambulatory Visit: Payer: Medicare Other | Admitting: Neurology

## 2020-09-02 ENCOUNTER — Telehealth: Payer: Self-pay | Admitting: Neurology

## 2020-09-02 ENCOUNTER — Other Ambulatory Visit: Payer: Self-pay | Admitting: Registered Nurse

## 2020-09-02 ENCOUNTER — Other Ambulatory Visit: Payer: Self-pay

## 2020-09-02 DIAGNOSIS — I10 Essential (primary) hypertension: Secondary | ICD-10-CM

## 2020-09-02 MED ORDER — TRAZODONE HCL 50 MG PO TABS: 50.0000 mg | ORAL_TABLET | Freq: Every day | ORAL | 1 refills | Status: DC

## 2020-09-02 NOTE — Telephone Encounter (Signed)
Pt called requesting refill for traZODone (DESYREL) 50 MG tablet. Pharmacy [facility] [phone].

## 2020-09-02 NOTE — Telephone Encounter (Signed)
E-scribed refill as requested. Pt has upcoming appt 09/12/20.

## 2020-09-12 ENCOUNTER — Encounter: Payer: Self-pay | Admitting: Neurology

## 2020-09-12 ENCOUNTER — Other Ambulatory Visit: Payer: Self-pay | Admitting: Neurology

## 2020-09-12 ENCOUNTER — Other Ambulatory Visit: Payer: Self-pay

## 2020-09-12 ENCOUNTER — Ambulatory Visit (INDEPENDENT_AMBULATORY_CARE_PROVIDER_SITE_OTHER): Payer: Medicare Other | Admitting: Neurology

## 2020-09-12 VITALS — BP 173/87 | HR 97 | Ht 62.0 in | Wt 184.5 lb

## 2020-09-12 DIAGNOSIS — R413 Other amnesia: Secondary | ICD-10-CM | POA: Diagnosis not present

## 2020-09-12 DIAGNOSIS — F0390 Unspecified dementia without behavioral disturbance: Secondary | ICD-10-CM | POA: Diagnosis not present

## 2020-09-12 DIAGNOSIS — F418 Other specified anxiety disorders: Secondary | ICD-10-CM

## 2020-09-12 DIAGNOSIS — R4189 Other symptoms and signs involving cognitive functions and awareness: Secondary | ICD-10-CM

## 2020-09-12 DIAGNOSIS — R41842 Visuospatial deficit: Secondary | ICD-10-CM

## 2020-09-12 MED ORDER — DONEPEZIL HCL 5 MG PO TABS: 5.0000 mg | ORAL_TABLET | Freq: Every day | ORAL | 0 refills | Status: DC

## 2020-09-12 NOTE — Progress Notes (Addendum)
Provider:  Melvyn Novasarmen  Weldon Nouri, M D  Referring Provider: Janeece AgeeMorrow, Richard, NP Primary Care Physician:  Janeece AgeeMorrow, Richard, NP  Chief Complaint  Patient presents with   New Patient (Initial Visit)    Pt alone, rm 10. Presents today new patient. She is a nervous person and has noted that she is shakey and has tremors. She has noticed short term memory is becoming a concern. Can't remember things like she used to and she has noticed this overt ime. She takes medication to for anxiety, pain and  sleep but states that she wakes up anxious during the night.     HPI:  Casey [name] is a 85 y.o. female and is seen here on 09-12-2020 in a RV for progressive memory loss.  She reports she feels it is harder to recall names and appointments.  She often finds herself sleeping in a choir, not sure for how long. She reports nervously shaking, trembling. She also state sh didn't sleep at all- and when asked since when she stated she filled trazodone on 09-09-2020. - she has felt it isn't working. No outside information.  She had an orthopedic surgery with Dr Linna CapriceSwinteck , total right knee. 07-24-2020. She is still on hydrocodone. Prescribed 07-25-2020.   MMSE - Mini Mental State Exam 09/12/2020 01/03/2020  Orientation to time 5 5  Orientation to Place 4 4  Registration 3 3  Attention/ Calculation 2 0  Recall 1 2  Language- name 2 objects 2 2  Language- repeat 0 1  Language- follow 3 step command 3 3  Language- read & follow direction 1 1  Write a sentence 1 1  Copy design 0 0  Total score 22 22         01-03-2020: Seen here upon Consult[facility]  [facility]. The patient is a single female at age 85, living alone.  She has felt that she is more challenged in terms of orientation forgetfulness.  She also is also is admittedly highly anxious and worried.  Sometimes she reports trembling out of tension and anxiety.  Her visit here today began with a Mini-Mental status  examination on which she scored 22 out of 30 points while being described as cooperative and pleasant and fully alert.  Her handwriting writing shows no sign of tremor she was fully oriented to time date and place but she had great trouble with spelling and with serial sevens.  She recalled 2 out of sort of 3 words, wrote a sentence but she could not copy a design or place the hands of the clock clock correctly.  She also named only 7 animals for the expected 11 or more.  So overall there is some slowing and some milder short-term memory loss more concerning is the visual spatial problem.  She reports still having hot flushes at night, waking up with palpitation and being drenched/ diaphoretic, but reports no nightmares.  She has been isolated, nobody visits but many friends and family members call her. She reports trouble with zip codes and phone numbers.   The patient has followed dr Leretha PolSantiago for several years, for DM, ankle edema, HTN, HLD, DM2 with neuropathy, GAD, Osteoarthritis. She is awaiting Dr Hoy RegisterWhitfield's call to schedule aTotal knee replacement on her right knee.  She is on several medications not conducive to brain health.    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 Later at the [employer], at concessions. .   Review of Systems: Out of a complete 14 system review, the patient complains of only the following symptoms, and all other reviewed systems are negative.   Memory loss;  Pain , insomnia with pain. Confusion on narcotics med.  Anxiety Desorientation.    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   cooking, washing, cleaning-   Occupational  History   [employer]   Tobacco Use   Smoking status: Never Smoker   Smokeless tobacco: Never Used  Vaping Use   Vaping Use: Never used  Substance and Sexual Activity   Alcohol use: No   Drug use: No   Sexual activity: Not Currently  Other Topics Concern   Not on file  Social History Narrative   She tries to stay active, but over the last year (20 19-20 20), she has been very limited by her right knee pain.   Is able to go grocery shopping. Never been married, one son ( 45) born out of wedlock, no grandchildren.   Social Determinants of Health   Financial Resource Strain: Not on file  Food Insecurity: Not on file  Transportation Needs: Not on file  Physical Activity: Not on file  Stress: Not on file  Social Connections: Not on file  Intimate Partner Violence: Not on file    Family History  Problem Relation Age of Onset   Coronary artery disease Mother 67   Cancer Father 43   Heart disease Father    Colon cancer Other    Diabetes Other    Coronary artery disease Sister    Diabetes type II Sister    Cancer Sister    Coronary artery disease Brother    Kidney disease Brother    Coronary artery disease Brother    Breast cancer Neg Hx     Past Medical History:  Diagnosis Date   ALLERGIC RHINITIS    Allergy    ANXIETY    ARM PAIN, RIGHT    BENIGN POSITIONAL VERTIGO    Carcinoma in situ of colon    CONSTIPATION    CYSTOCELE WITH INCOMPLETE UTERINE PROLAPSE    Diabetes mellitus without complication (HCC)    ESOPHAGEAL STRICTURE    FATIGUE    FLANK PAIN, LEFT    Headache(784.0)    HIP PAIN, LEFT    HYPERCHOLESTEROLEMIA    HYPERGLYCEMIA    HYPERTENSION, BENIGN ESSENTIAL 03/03/2007   LEG EDEMA, BILATERAL    Microscopic hematuria    OBESITY    OSTEOARTHRITIS, KNEE    OSTEOPOROSIS    Peripheral neuropathy, idiopathic    Problems with swallowing and mastication    SINUSITIS, RECURRENT    SPRAIN&STRAIN OTH SPEC SITES SHOULDER&UPPER ARM     Past Surgical  History:  Procedure Laterality Date   COLONOSCOPY     KNEE ARTHROPLASTY Right 07/24/2020   Procedure: COMPUTER ASSISTED TOTAL KNEE ARTHROPLASTY;  Surgeon: Samson Frederic, MD;  Location: [facility];  Service: Orthopedics;  Laterality: Right;   LEFT HEART CATHETERIZATION WITH CORONARY ANGIOGRAM N/A 12/02/2010   Procedure: LEFT HEART CATHETERIZATION WITH CORONARY ANGIOGRAM;  Surgeon: Peter M Swaziland, MD;  Location: [facility];  Service: Cardiovascular;:: NORMAL CORONARY ARTERIES - NORMAL LV FUNCTION & EDP.     TRANSTHORACIC ECHOCARDIOGRAM  11/2010   Normal EF 60-65%. No RWMA.  Mild LAD dilation c/w Gr 1 DD.  -- PRETTY NORMAL ECHO    Current Outpatient Medications  Medication Sig Dispense Refill   ALPRAZolam (XANAX) 0.25 MG tablet TAKE 1 TABLET(0.25 MG) BY MOUTH AT BEDTIME AS NEEDED FOR ANXIETY 30 tablet 3   amLODipine (NORVASC) 5 MG tablet TAKE 1 TABLET(5 MG) BY MOUTH DAILY 90 tablet 0   aspirin 81 MG chewable tablet Chew 1 tablet (81 mg total) by mouth 2 (two) times daily.     atorvastatin (LIPITOR) 10 MG tablet Take 1 tablet (10 mg total) by mouth daily. 90 tablet 3   azelastine (ASTELIN) 0.1 % nasal spray Place 1 spray into both nostrils 2 (two) times daily. Use in each nostril as directed 30 mL 12   Carboxymethylcellul-Glycerin (REFRESH OPTIVE) 1-0.9 % GEL Place 1 drop into both eyes daily.     docusate sodium (COLACE) 100 MG capsule Take 1 capsule (100 mg total) by mouth 2 (two) times daily. 10 capsule 0   furosemide (LASIX) 40 MG tablet Take 1 tablet (40 mg total) by mouth daily. 90 tablet 1   gabapentin (NEURONTIN) 600 MG tablet TAKE 1 TABLET(600 MG) BY MOUTH THREE TIMES DAILY 270 tablet 2   HYDROcodone-acetaminophen (NORCO/VICODIN) 5-325 MG tablet Take 1 tablet by mouth every 6 (six) hours as needed for moderate pain. For chronic pain 90 tablet 0   hydrOXYzine (ATARAX/VISTARIL) 10 MG tablet TAKE 1 TABLET(10 MG) BY MOUTH THREE TIMES DAILY AS NEEDED 90 tablet 0   lisinopril (ZESTRIL) 40 MG  tablet TAKE 1 TABLET(40 MG) BY MOUTH DAILY 90 tablet 0   meclizine (ANTIVERT) 25 MG tablet TAKE 1 TABLET(25 MG) BY MOUTH THREE TIMES DAILY 270 tablet 0   metFORMIN (GLUCOPHAGE) 500 MG tablet TAKE 1 TABLET(500 MG) BY MOUTH TWICE DAILY WITH A MEAL 180 tablet 1   omeprazole (PRILOSEC) 40 MG capsule TAKE 1 CAPSULE(40 MG) BY MOUTH DAILY 30 capsule 5   ondansetron (ZOFRAN) 4 MG tablet Take 1 tablet (4 mg total) by mouth every 6 (six) hours as needed for nausea. 20 tablet 0   oxyCODONE (OXY IR/ROXICODONE) 5 MG immediate release tablet Take 1-2 tablets (5-10 mg total) by mouth every 4 (four) hours as needed for moderate pain (pain score 4-6). 40 tablet 0   senna (SENOKOT) 8.6 MG TABS tablet Take 1 tablet (8.6 mg total) by mouth 2 (two) times daily. 120 tablet 0   traZODone (DESYREL) 50 MG tablet Take 1 tablet (50 mg total) by mouth at bedtime. Must keep upcoming appt (09/12/20) for future refills 30 tablet 1   No current facility-administered medications for this visit.     Allergies as of 09/12/2020   (No Known Allergies)    Vitals: There were no vitals taken for this visit. Last Weight:  Wt Readings from Last 1 Encounters:  07/24/20 192 lb 14.4 oz (87.5 kg)   Last Height:   Ht Readings from Last 1 Encounters:  07/24/20 5\' 2"  (1.575 m)    Physical exam:  General: The patient is awake, alert and appears not in acute distress. The patient is well groomed. Head: Normocephalic, atraumatic. Neck is supple.  Mallampati; 2  neck circumference: 15.5 " Cardiovascular:  Regular rate and rhythm, without  murmurs or carotid bruit, and without distended neck veins. Respiratory: Lungs are clear to  auscultation. Skin:  With evidence of right ankle edema, scar is well healed.  Trunk: BMI is now 33/ kg /m2  from 38 kg/m2  -the patient is seated.  Uses a walker.  Neurologic exam : The patient is awake and alert, oriented to place and time.  Memory subjective  described as impaired. .  There is a limited   attention span & concentration ability.  Speech is fluent with dysarthria, dysphonia - but there is some  aphasia.  Mood and affect are anxious.   Cranial nerves: Pupils are equal and briskly reactive to light. Funduscopic exam deferred. Extraocular movements  in vertical and horizontal planes intact and without nystagmus.  Visual fields by finger perimetry are intact. Hearing to finger rub intact.  Facial sensation intact to fine touch.  Facial motor strength is symmetric / smile is symmetric- and tongue deviated to the left ( mildly)  While the uvula moved in midline.  Tongue protrusion into either cheek is normal.  Shoulder shrug is bilateral limited, she reports muscle soreness and joint stiffness.  Motor exam:   Normal tone ,muscle bulk . Weakness of hip flexion, hip adduction and retroflexion of the foot, weakness for the foot drop pronounced on the left.  She is very tender to muscle palpation - MYALGIA>  and joints are tender too. No cogwheeling. Sensory:  Fine touch and vibration were tested in all extremities.  Proprioception was normal.  Coordination: Rapid alternating movements in the fingers/hands were normal.  Finger-to-nose maneuver normal on the right, and slowed on the left with evidence of dysmetria or tremor,  A tremor was not noted on the right hand.  Gait and station: Patient walks without assistive device and is able unassisted to climb up to the exam table. Strength within normal limits. Stance is stable and normal.  Tandem gait is unfragmented.    Deep tendon reflexes: in the  upper and lower extremities are symmetric and intact.   Assessment:  After physical and neurologic examination, review of laboratory studies, imaging, neurophysiology testing and pre-existing records, assessment is that of :  NCD- neurocognitive disorder , mild - to moderate by MMSE 22/ 30 points. There are risk factors for ministrokes, vascular dementia and also pseudodementia with  depression and anxiety. GERIATRIC DEPRESSION SCORE ENDORSED AT 10 out 15 points !!! Plan:  Treatment plan and additional workup :  Dementia work up with imaging study, labs were normal, DM known, not poorly controlled.   MRI was not done last time, no note of why?- will reorder .  I think she needs NAMENDA or Aricept , but her insurance flagged all the brand and generic forms as not on her formulary.  Asking her MD to wean her off opiates and benzodiazepines.   I wrote for Trazodone. She has stated it doesn't help, but refilled on 09-09-2020?   MIND diet, instructions given. Sleep hygiene.    PCP : we have no access to social work, but that's what is needed.consider cardiac monitoring as patient reports she wakes with palpitations and shaking.   Patient endorsed 7/ 15 points on the geriatric Depression score- anxiety and depression may be cause of her attacks?  Explore daytime activity with PACE.   Prn : RV in another 6 month for repeat MMSE  , NP to see her.     Casey Mylar Casey Yankey MD 09/12/2020

## 2020-09-12 NOTE — Addendum Note (Signed)
Addended by: Melvyn Novas on: 09/12/2020 03:17 PM   Modules accepted: Orders

## 2020-09-12 NOTE — Patient Instructions (Signed)

## 2020-09-16 ENCOUNTER — Telehealth: Payer: Self-pay | Admitting: Neurology

## 2020-09-16 NOTE — Telephone Encounter (Signed)
[employer] medicare/medicaid order sent to [facility]. [facility] they will reach out to the patient to schedule.  

## 2020-09-19 ENCOUNTER — Other Ambulatory Visit: Payer: Self-pay | Admitting: Registered Nurse

## 2020-09-19 DIAGNOSIS — H8110 Benign paroxysmal vertigo, unspecified ear: Secondary | ICD-10-CM

## 2020-09-19 NOTE — Telephone Encounter (Signed)
LFD 05/20/20 #270 with no refills LOV 05/23/20 NOV 10/16/20

## 2020-09-23 ENCOUNTER — Encounter (HOSPITAL_COMMUNITY): Payer: Self-pay | Admitting: Emergency Medicine

## 2020-09-23 ENCOUNTER — Emergency Department (HOSPITAL_COMMUNITY): Payer: Medicare Other

## 2020-09-23 ENCOUNTER — Ambulatory Visit (HOSPITAL_COMMUNITY)
Admission: EM | Admit: 2020-09-23 | Discharge: 2020-09-23 | Disposition: A | Discharge status: Home / Self Care | Payer: Medicare Other

## 2020-09-23 ENCOUNTER — Other Ambulatory Visit: Payer: Self-pay

## 2020-09-23 ENCOUNTER — Encounter (HOSPITAL_COMMUNITY): Payer: Self-pay

## 2020-09-23 ENCOUNTER — Emergency Department (HOSPITAL_COMMUNITY)
Admission: EM | Admit: 2020-09-23 | Discharge: 2020-09-24 | Disposition: A | Discharge status: Home / Self Care | Payer: Medicare Other | Attending: Emergency Medicine | Admitting: Emergency Medicine

## 2020-09-23 DIAGNOSIS — H8110 Benign paroxysmal vertigo, unspecified ear: Secondary | ICD-10-CM | POA: Diagnosis not present

## 2020-09-23 DIAGNOSIS — Z20822 Contact with and (suspected) exposure to covid-19: Secondary | ICD-10-CM | POA: Diagnosis not present

## 2020-09-23 DIAGNOSIS — Z7982 Long term (current) use of aspirin: Secondary | ICD-10-CM | POA: Diagnosis not present

## 2020-09-23 DIAGNOSIS — R531 Weakness: Secondary | ICD-10-CM

## 2020-09-23 DIAGNOSIS — I10 Essential (primary) hypertension: Secondary | ICD-10-CM | POA: Insufficient documentation

## 2020-09-23 DIAGNOSIS — Z79899 Other long term (current) drug therapy: Secondary | ICD-10-CM | POA: Insufficient documentation

## 2020-09-23 DIAGNOSIS — R519 Headache, unspecified: Secondary | ICD-10-CM

## 2020-09-23 DIAGNOSIS — Z85038 Personal history of other malignant neoplasm of large intestine: Secondary | ICD-10-CM | POA: Diagnosis not present

## 2020-09-23 DIAGNOSIS — R030 Elevated blood-pressure reading, without diagnosis of hypertension: Secondary | ICD-10-CM | POA: Diagnosis not present

## 2020-09-23 DIAGNOSIS — Z7984 Long term (current) use of oral hypoglycemic drugs: Secondary | ICD-10-CM | POA: Diagnosis not present

## 2020-09-23 DIAGNOSIS — R42 Dizziness and giddiness: Secondary | ICD-10-CM

## 2020-09-23 DIAGNOSIS — E1140 Type 2 diabetes mellitus with diabetic neuropathy, unspecified: Secondary | ICD-10-CM | POA: Diagnosis not present

## 2020-09-23 DIAGNOSIS — F0390 Unspecified dementia without behavioral disturbance: Secondary | ICD-10-CM | POA: Diagnosis not present

## 2020-09-23 LAB — CBC WITH DIFFERENTIAL/PLATELET
Abs Immature Granulocytes: 0.04 10*3/uL (ref 0.00–0.07)
Basophils Absolute: 0.1 10*3/uL (ref 0.0–0.1)
Basophils Relative: 1 %
Eosinophils Absolute: 0.1 10*3/uL (ref 0.0–0.5)
Eosinophils Relative: 1 %
HCT: 38.6 % (ref 36.0–46.0)
Hemoglobin: 12.7 g/dL (ref 12.0–15.0)
Immature Granulocytes: 0 %
Lymphocytes Relative: 44 %
Lymphs Abs: 4.2 10*3/uL — ABNORMAL HIGH (ref 0.7–4.0)
MCH: 27.1 pg (ref 26.0–34.0)
MCHC: 32.9 g/dL (ref 30.0–36.0)
MCV: 82.5 fL (ref 80.0–100.0)
Monocytes Absolute: 0.7 10*3/uL (ref 0.1–1.0)
Monocytes Relative: 7 %
Neutro Abs: 4.6 10*3/uL (ref 1.7–7.7)
Neutrophils Relative %: 47 %
Platelets: 372 10*3/uL (ref 150–400)
RBC: 4.68 MIL/uL (ref 3.87–5.11)
RDW: 15.5 % (ref 11.5–15.5)
WBC: 9.7 10*3/uL (ref 4.0–10.5)
nRBC: 0 % (ref 0.0–0.2)

## 2020-09-23 LAB — URINALYSIS, COMPLETE (UACMP) WITH MICROSCOPIC
Bilirubin Urine: NEGATIVE
Glucose, UA: NEGATIVE mg/dL
Ketones, ur: NEGATIVE mg/dL
Leukocytes,Ua: NEGATIVE
Nitrite: NEGATIVE
Protein, ur: NEGATIVE mg/dL
Specific Gravity, Urine: 1.004 — ABNORMAL LOW (ref 1.005–1.030)
pH: 8 (ref 5.0–8.0)

## 2020-09-23 LAB — COMPREHENSIVE METABOLIC PANEL
ALT: 14 U/L (ref 0–44)
AST: 23 U/L (ref 15–41)
Albumin: 4.2 g/dL (ref 3.5–5.0)
Alkaline Phosphatase: 77 U/L (ref 38–126)
Anion gap: 13 (ref 5–15)
BUN: 7 mg/dL — ABNORMAL LOW (ref 8–23)
CO2: 26 mmol/L (ref 22–32)
Calcium: 9.9 mg/dL (ref 8.9–10.3)
Chloride: 98 mmol/L (ref 98–111)
Creatinine, Ser: 0.79 mg/dL (ref 0.44–1.00)
GFR, Estimated: >60 mL/min (ref >60)
Glucose, Bld: 169 mg/dL — ABNORMAL HIGH (ref 70–99)
Potassium: 3 mmol/L — ABNORMAL LOW (ref 3.5–5.1)
Sodium: 137 mmol/L (ref 135–145)
Total Bilirubin: 0.8 mg/dL (ref 0.3–1.2)
Total Protein: 7.8 g/dL (ref 6.5–8.1)

## 2020-09-23 LAB — RESP PANEL BY RT-PCR (FLU A&B, COVID) ARPGX2
Influenza A by PCR: NEGATIVE
Influenza B by PCR: NEGATIVE
SARS Coronavirus 2 by RT PCR: NEGATIVE

## 2020-09-23 LAB — AMMONIA: Ammonia: 49 umol/L — ABNORMAL HIGH (ref 9–35)

## 2020-09-23 LAB — T4, FREE: Free T4: 0.94 ng/dL (ref 0.61–1.12)

## 2020-09-23 LAB — LACTIC ACID, PLASMA: Lactic Acid, Venous: 1.6 mmol/L (ref 0.5–1.9)

## 2020-09-23 LAB — TSH: TSH: 1.544 u[IU]/mL (ref 0.350–4.500)

## 2020-09-23 MED ORDER — LACTATED RINGERS IV BOLUS
1000.0000 mL | Freq: Once | INTRAVENOUS | Status: AC
  Administered 2020-09-23: 1000 mL via INTRAVENOUS

## 2020-09-23 MED ORDER — POTASSIUM CHLORIDE CRYS ER 20 MEQ PO TBCR
40.0000 meq | EXTENDED_RELEASE_TABLET | Freq: Once | ORAL | Status: AC
  Administered 2020-09-23: 40 meq via ORAL
  Filled 2020-09-23: qty 2

## 2020-09-23 MED ORDER — DIAZEPAM 2 MG PO TABS: 2.0000 mg | ORAL_TABLET | Freq: Once | ORAL | Status: DC

## 2020-09-23 MED ORDER — MECLIZINE HCL 25 MG PO TABS
25.0000 mg | ORAL_TABLET | Freq: Once | ORAL | Status: AC
  Administered 2020-09-23: 25 mg via ORAL
  Filled 2020-09-23: qty 1

## 2020-09-23 MED ORDER — DIAZEPAM 5 MG/ML IJ SOLN
2.5000 mg | Freq: Once | INTRAMUSCULAR | Status: AC
  Administered 2020-09-23: 2.5 mg via INTRAVENOUS
  Filled 2020-09-23: qty 2

## 2020-09-23 MED ORDER — MECLIZINE HCL 50 MG PO TABS: 50.0000 mg | ORAL_TABLET | Freq: Three times a day (TID) | ORAL | 0 refills | Status: DC | PRN

## 2020-09-23 NOTE — ED Notes (Signed)
Patient requests EMS transport to go to ER, will call non-emergency

## 2020-09-23 NOTE — ED Triage Notes (Signed)
Pt reports her blood pressure was 173/87 at the Neurologist office on 09/12/2020. Pt reports she feels bad and shaking for the past 3 days. Denies chest pain, dizziness, headache, nausea

## 2020-09-23 NOTE — ED Triage Notes (Addendum)
Pt BIB [facility] from [facility], pt c/o 10/10 headache x3 days. BP 192/100. Hx of HTN. HR 90. 99% RA. CBG 171. Pt also endorses blurred vision, states this may be from her dry eye. No neuro deficits.

## 2020-09-23 NOTE — ED Provider Notes (Signed)
[facility]    CSN: [id number] Arrival date & time: 09/23/20  1129      History   Chief Complaint Chief Complaint  Patient presents with   Hypertension    HPI Jabier Gaussda B Reppond is a 85 y.o. female.   Patient presenting today with 3-day history of significant posterior headache, balance issues, mild mental status changes which she states is abnormal for her, weakness.  She does have some postnasal drainage, nasal congestion, cough additionally but does not feel the symptoms are nearly as bothersome as the other symptoms and only reports these when asked directly.  Patient continues to state that she just feels "off "and that she feels "terrible ".  Her blood pressure has been noted to be moderately elevated for the last week or so, 170s over high 80s at the neurologist office last week despite taking her 2 blood pressure medications consistently.  She denies syncope, chest pain, shortness of breath, falls.  Has had some shaking and palpitations and states that she feels very anxious at the moment.  Past medical history significant for anxiety, vertigo, diabetes, hypertension, hyperlipidemia.    Past Medical History:  Diagnosis Date   ALLERGIC RHINITIS    Allergy    ANXIETY    ARM PAIN, RIGHT    BENIGN POSITIONAL VERTIGO    Carcinoma in situ of colon    CONSTIPATION    CYSTOCELE WITH INCOMPLETE UTERINE PROLAPSE    Diabetes mellitus without complication (HCC)    ESOPHAGEAL STRICTURE    FATIGUE    FLANK PAIN, LEFT    Headache(784.0)    HIP PAIN, LEFT    HYPERCHOLESTEROLEMIA    HYPERGLYCEMIA    HYPERTENSION, BENIGN ESSENTIAL 03/03/2007   LEG EDEMA, BILATERAL    Microscopic hematuria    OBESITY    OSTEOARTHRITIS, KNEE    OSTEOPOROSIS    Peripheral neuropathy, idiopathic    Problems with swallowing and mastication    SINUSITIS, RECURRENT    SPRAIN&STRAIN OTH SPEC SITES SHOULDER&UPPER ARM     Patient Active Problem List   Diagnosis Date Noted   Osteoarthritis of  right knee 07/24/2020   Cognitive deficit with impaired visuospatial function 01/03/2020   Memory impairment of gradual onset 01/03/2020   Anxiety about health 01/03/2020   Dementia without behavioral disturbance (HCC) 01/03/2020   Gastroesophageal reflux disease without esophagitis 07/17/2019   Preoperative cardiovascular examination 05/13/2018   Vitamin B12 deficiency 07/14/2017   Primary osteoarthritis of left knee 07/07/2013   Type 2 diabetes mellitus with microalbuminuria, without long-term current use of insulin (HCC) 05/26/2012   Anemia 10/23/2011   Encounter for medication monitoring 10/23/2011   Hyponatremia 12/01/2010    Class: Acute   Chest pain with moderate risk for cardiac etiology 11/26/2010    Class: Acute   CYSTOCELE WITH INCOMPLETE UTERINE PROLAPSE 03/10/2010   HYPERGLYCEMIA 02/06/2010   ESOPHAGEAL STRICTURE 12/11/2009   Obesity 12/09/2009   PROBLEMS WITH SWALLOWING AND MASTICATION 12/09/2009   MICROSCOPIC HEMATURIA 07/29/2009   ARM PAIN, RIGHT 03/28/2009   Fatigue 03/28/2009   CONSTIPATION 09/25/2008   Sinusitis, chronic 02/23/2008   Osteoarthrosis involving lower leg 09/28/2007   LEG EDEMA, BILATERAL 08/31/2007   FLANK PAIN, LEFT 06/08/2007   SPRAIN&STRAIN OTH SPEC SITES SHOULDER&UPPER ARM 06/08/2007   BENIGN POSITIONAL VERTIGO 04/25/2007   Headache 03/24/2007   HYPERCHOLESTEROLEMIA 03/03/2007   GAD (generalized anxiety disorder) 03/03/2007   HYPERTENSION, BENIGN ESSENTIAL 03/03/2007   Allergic rhinitis 03/03/2007   HIP PAIN, LEFT 03/03/2007   Osteoporosis 03/03/2007  Past Surgical History:  Procedure Laterality Date   COLONOSCOPY     KNEE ARTHROPLASTY Right 07/24/2020   Procedure: COMPUTER ASSISTED TOTAL KNEE ARTHROPLASTY;  Surgeon: Samson Frederic, MD;  Location: [facility];  Service: Orthopedics;  Laterality: Right;   LEFT HEART CATHETERIZATION WITH CORONARY ANGIOGRAM N/A 12/02/2010   Procedure: LEFT HEART CATHETERIZATION WITH CORONARY ANGIOGRAM;   Surgeon: Peter M Swaziland, MD;  Location: [facility] [place] CATH LAB;  Service: Cardiovascular;:: NORMAL CORONARY ARTERIES - NORMAL LV FUNCTION & EDP.     TRANSTHORACIC ECHOCARDIOGRAM  11/2010   Normal EF 60-65%. No RWMA.  Mild LAD dilation c/w Gr 1 DD.  -- PRETTY NORMAL ECHO    OB History   No obstetric history on file.      Home Medications    Prior to Admission medications   Medication Sig Start Date End Date Taking? Authorizing Provider  ALPRAZolam (XANAX) 0.25 MG tablet TAKE 1 TABLET(0.25 MG) BY MOUTH AT BEDTIME AS NEEDED FOR ANXIETY 05/23/20   [name], NP  amLODipine (NORVASC) 5 MG tablet TAKE 1 TABLET(5 MG) BY MOUTH DAILY 08/04/20   [name], NP  aspirin 81 MG chewable tablet Chew 1 tablet (81 mg total) by mouth 2 (two) times daily. 07/25/20   Darrick Grinder, PA  atorvastatin (LIPITOR) 10 MG tablet Take 1 tablet (10 mg total) by mouth daily. 01/23/19   Lezlie Lye, Meda Coffee, MD  azelastine (ASTELIN) 0.1 % nasal spray Place 1 spray into both nostrils 2 (two) times daily. Use in each nostril as directed 05/23/20   [name], NP  Carboxymethylcellul-Glycerin (REFRESH OPTIVE) 1-0.9 % GEL Place 1 drop into both eyes daily.    [provider]  docusate sodium (COLACE) 100 MG capsule Take 1 capsule (100 mg total) by mouth 2 (two) times daily. 07/25/20   Barrie Dunker B, PA  donepezil (ARICEPT) 5 MG tablet TAKE 1 TABLET(5 MG) BY MOUTH AT BEDTIME 09/12/20   Dohmeier, Porfirio Mylar, MD  furosemide (LASIX) 40 MG tablet Take 1 tablet (40 mg total) by mouth daily. 11/17/19   [name], NP  gabapentin (NEURONTIN) 600 MG tablet TAKE 1 TABLET(600 MG) BY MOUTH THREE TIMES DAILY 09/04/19   Lezlie Lye, Meda Coffee, MD  HYDROcodone-acetaminophen (NORCO/VICODIN) 5-325 MG tablet Take 1 tablet by mouth every 6 (six) hours as needed for moderate pain. For chronic pain 07/09/20   [name], NP  hydrOXYzine (ATARAX/VISTARIL) 10 MG tablet TAKE 1 TABLET(10 MG) BY MOUTH THREE TIMES DAILY AS NEEDED 08/18/20    [name], NP  lisinopril (ZESTRIL) 40 MG tablet TAKE 1 TABLET(40 MG) BY MOUTH DAILY 09/02/20   [name], NP  meclizine (ANTIVERT) 25 MG tablet TAKE 1 TABLET(25 MG) BY MOUTH THREE TIMES DAILY 09/19/20   [name], NP  metFORMIN (GLUCOPHAGE) 500 MG tablet TAKE 1 TABLET(500 MG) BY MOUTH TWICE DAILY WITH A MEAL 05/06/20   [name], NP  omeprazole (PRILOSEC) 40 MG capsule TAKE 1 CAPSULE(40 MG) BY MOUTH DAILY 07/01/20   [name], NP  ondansetron (ZOFRAN) 4 MG tablet Take 1 tablet (4 mg total) by mouth every 6 (six) hours as needed for nausea. 07/25/20   Barrie Dunker B, PA  oxyCODONE (OXY IR/ROXICODONE) 5 MG immediate release tablet Take 1-2 tablets (5-10 mg total) by mouth every 4 (four) hours as needed for moderate pain (pain score 4-6). 07/25/20   Darrick Grinder, PA  senna (SENOKOT) 8.6 MG TABS tablet Take 1 tablet (8.6 mg total) by mouth 2 (two) times daily. 07/25/20  Barrie Dunker B, PA  traZODone (DESYREL) 50 MG tablet Take 1 tablet (50 mg total) by mouth at bedtime. Must keep upcoming appt (09/12/20) for future refills 09/02/20   Dohmeier, Porfirio Mylar, MD    Family History Family History  Problem Relation Age of Onset   Coronary artery disease Mother 83   Cancer Father 25   Heart disease Father    Colon cancer Other    Diabetes Other    Coronary artery disease Sister    Diabetes type II Sister    Cancer Sister    Coronary artery disease Brother    Kidney disease Brother    Coronary artery disease Brother    Breast cancer Neg Hx     Social History Social History   Tobacco Use   Smoking status: Never   Smokeless tobacco: Never  Vaping Use   Vaping Use: Never used  Substance Use Topics   Alcohol use: No   Drug use: No     Allergies   Patient has no known allergies.   Review of Systems Review of Systems Per HPI  Physical Exam Triage Vital Signs ED Triage Vitals  Enc Vitals Group     BP 09/23/20 1158 (!) 165/73     Pulse Rate 09/23/20 1158  (!) 103     Resp 09/23/20 1158 18     Temp 09/23/20 1158 98.4 F (36.9 C)     Temp Source 09/23/20 1158 Oral     SpO2 09/23/20 1158 99 %     Weight --      Height --      Head Circumference --      Peak Flow --      Pain Score 09/23/20 1157 0     Pain Loc --      Pain Edu? --      Excl. in GC? --    No data found.  Updated Vital Signs BP (!) 165/73 (BP Location: Right Arm)   Pulse (!) 103   Temp 98.4 F (36.9 C) (Oral)   Resp 18   SpO2 99%   Visual Acuity Right Eye Distance:   Left Eye Distance:   Bilateral Distance:    Right Eye Near:   Left Eye Near:    Bilateral Near:     Physical Exam Vitals and nursing note reviewed.  Constitutional:      Appearance: Normal appearance. She is not ill-appearing.  HENT:     Head: Atraumatic.     Right Ear: Tympanic membrane normal.     Left Ear: Tympanic membrane normal.     Nose: Rhinorrhea present.     Mouth/Throat:     Mouth: Mucous membranes are moist.     Pharynx: Posterior oropharyngeal erythema present.  Eyes:     Extraocular Movements: Extraocular movements intact.     Conjunctiva/sclera: Conjunctivae normal.  Cardiovascular:     Rate and Rhythm: Regular rhythm. Tachycardia present.     Heart sounds: Normal heart sounds.  Pulmonary:     Effort: Pulmonary effort is normal. No respiratory distress.     Breath sounds: Normal breath sounds. No wheezing or rales.  Abdominal:     General: Bowel sounds are normal.     Palpations: Abdomen is soft.  Musculoskeletal:        General: Normal range of motion.     Cervical back: Normal range of motion and neck supple.  Skin:    General: Skin is warm and dry.  Neurological:  Mental Status: She is alert.     Comments: Unclear baseline mental status, but answering questions appropriately today though she feels she is not at her cognitive baseline.  She does appear weak and fatigued, gait significantly altered but is status post knee replacement several months ago.   Walking with a cane at this time which she states is her current baseline  Psychiatric:        Mood and Affect: Mood normal.        Thought Content: Thought content normal.        Judgment: Judgment normal.   UC Treatments / Results  Labs (all labs ordered are listed, but only abnormal results are displayed) Labs Reviewed - No data to display  EKG  Radiology No results found.  Procedures Procedures (including critical care time)  Medications Ordered in UC Medications - No data to display  Initial Impression / Assessment and Plan / UC Course  I have reviewed the triage vital signs and the nursing notes.  Pertinent labs & imaging results that were available during my care of the patient were reviewed by me and considered in my medical decision making (see chart for details).     Blood pressure moderately elevated, mildly tachycardic in triage which does appear to be somewhat her baseline over the last few months per previous notes.  Unclear etiology of her generalized symptoms, possibly COVID or other viral illness but given the severity of her headache and her subjective dizziness, balance issues as well as her age and chronic medical problems this is cause for concern.  Suggested labs, COVID testing and close monitoring and PCP follow-up versus going to the hospital for further immediate evaluation.  She is opting to go to the emergency room given how bad she is feeling.  She states she does not have any mode of transportation to get over there and is asking specifically for EMS to be called to help transport.  EMS was called and arrived for transport.  Final Clinical Impressions(s) / UC Diagnoses   Final diagnoses:  Elevated blood pressure reading  Dizziness  Bad headache  Weakness   Discharge Instructions   None    ED Prescriptions   None    PDMP not reviewed this encounter.   Particia Nearing, [place] 09/23/20 1505

## 2020-09-23 NOTE — ED Provider Notes (Signed)
[facility] EMERGENCY DEPARTMENT Provider Note   CSN: [id number] Arrival date & time: 09/23/20  1519     History Chief Complaint  Patient presents with   Headache    Casey [name] is a 85 y.o. female with PMHx HTN, HLD, T2DM, GAD who presents for evaluation of dizziness.  Patient presents for evaluation of a 61-month history of constitutional symptoms including night sweats, decreased appetite, and weight loss.  Over this time, she notes that she has had severe posterior headache which sometimes radiates to involve the entirety of her head.  She reports experiencing generalized weakness.  She also notes intermittent vertigo, which makes it difficult for her to ambulate around the house.  She states that she frequently loses her balance and feels unsteady on her feet.  She denies syncope, focal weakness, or focal numbness.  No vision changes.  She denies any nausea, vomiting, or positional character of her headache.  She was evaluated in the outpatient neurology clinic almost a week ago and scheduled for outpatient MRI.  Due to her symptoms today, she presented to urgent care and was advised to come to our emergency department for further evaluation.     Past Medical History:  Diagnosis Date   ALLERGIC RHINITIS    Allergy    ANXIETY    ARM PAIN, RIGHT    BENIGN POSITIONAL VERTIGO    Carcinoma in situ of colon    CONSTIPATION    CYSTOCELE WITH INCOMPLETE UTERINE PROLAPSE    Diabetes mellitus without complication (HCC)    ESOPHAGEAL STRICTURE    FATIGUE    FLANK PAIN, LEFT    Headache(784.0)    HIP PAIN, LEFT    HYPERCHOLESTEROLEMIA    HYPERGLYCEMIA    HYPERTENSION, BENIGN ESSENTIAL 03/03/2007   LEG EDEMA, BILATERAL    Microscopic hematuria    OBESITY    OSTEOARTHRITIS, KNEE    OSTEOPOROSIS    Peripheral neuropathy, idiopathic    Problems with swallowing and mastication    SINUSITIS, RECURRENT    SPRAIN&STRAIN OTH SPEC SITES SHOULDER&UPPER ARM      Patient Active Problem List   Diagnosis Date Noted   Osteoarthritis of right knee 07/24/2020   Cognitive deficit with impaired visuospatial function 01/03/2020   Memory impairment of gradual onset 01/03/2020   Anxiety about health 01/03/2020   Dementia without behavioral disturbance (HCC) 01/03/2020   Gastroesophageal reflux disease without esophagitis 07/17/2019   Preoperative cardiovascular examination 05/13/2018   Vitamin B12 deficiency 07/14/2017   Primary osteoarthritis of left knee 07/07/2013   Type 2 diabetes mellitus with microalbuminuria, without long-term current use of insulin (HCC) 05/26/2012   Anemia 10/23/2011   Encounter for medication monitoring 10/23/2011   Hyponatremia 12/01/2010    Class: Acute   Chest pain with moderate risk for cardiac etiology 11/26/2010    Class: Acute   CYSTOCELE WITH INCOMPLETE UTERINE PROLAPSE 03/10/2010   HYPERGLYCEMIA 02/06/2010   ESOPHAGEAL STRICTURE 12/11/2009   Obesity 12/09/2009   PROBLEMS WITH SWALLOWING AND MASTICATION 12/09/2009   MICROSCOPIC HEMATURIA 07/29/2009   ARM PAIN, RIGHT 03/28/2009   Fatigue 03/28/2009   CONSTIPATION 09/25/2008   Sinusitis, chronic 02/23/2008   Osteoarthrosis involving lower leg 09/28/2007   LEG EDEMA, BILATERAL 08/31/2007   FLANK PAIN, LEFT 06/08/2007   SPRAIN&STRAIN OTH SPEC SITES SHOULDER&UPPER ARM 06/08/2007   BENIGN POSITIONAL VERTIGO 04/25/2007   Headache 03/24/2007   HYPERCHOLESTEROLEMIA 03/03/2007   GAD (generalized anxiety disorder) 03/03/2007   HYPERTENSION, BENIGN ESSENTIAL 03/03/2007   Allergic rhinitis  03/03/2007   HIP PAIN, LEFT 03/03/2007   Osteoporosis 03/03/2007    Past Surgical History:  Procedure Laterality Date   COLONOSCOPY     KNEE ARTHROPLASTY Right 07/24/2020   Procedure: COMPUTER ASSISTED TOTAL KNEE ARTHROPLASTY;  Surgeon: Samson Frederic, MD;  Location: [facility];  Service: Orthopedics;  Laterality: Right;   LEFT HEART CATHETERIZATION WITH CORONARY ANGIOGRAM N/A  12/02/2010   Procedure: LEFT HEART CATHETERIZATION WITH CORONARY ANGIOGRAM;  Surgeon: Peter M Swaziland, MD;  Location: [facility] CATH LAB;  Service: Cardiovascular;:: NORMAL CORONARY ARTERIES - NORMAL LV FUNCTION & EDP.     TRANSTHORACIC ECHOCARDIOGRAM  11/2010   Normal EF 60-65%. No RWMA.  Mild LAD dilation c/w Gr 1 DD.  -- PRETTY NORMAL ECHO     OB History   No obstetric history on file.     Family History  Problem Relation Age of Onset   Coronary artery disease Mother 63   Cancer Father 87   Heart disease Father    Colon cancer Other    Diabetes Other    Coronary artery disease Sister    Diabetes type II Sister    Cancer Sister    Coronary artery disease Brother    Kidney disease Brother    Coronary artery disease Brother    Breast cancer Neg Hx     Social History   Tobacco Use   Smoking status: Never   Smokeless tobacco: Never  Vaping Use   Vaping Use: Never used  Substance Use Topics   Alcohol use: No   Drug use: No    Home Medications Prior to Admission medications   Medication Sig Start Date End Date Taking? Authorizing Provider  meclizine (ANTIVERT) 50 MG tablet Take 1 tablet (50 mg total) by mouth 3 (three) times daily as needed. 09/23/20  Yes Holley Dexter, MD  ALPRAZolam Prudy Feeler) 0.25 MG tablet TAKE 1 TABLET(0.25 MG) BY MOUTH AT BEDTIME AS NEEDED FOR ANXIETY 05/23/20   [name], NP  amLODipine (NORVASC) 5 MG tablet TAKE 1 TABLET(5 MG) BY MOUTH DAILY 08/04/20   [name], NP  aspirin 81 MG chewable tablet Chew 1 tablet (81 mg total) by mouth 2 (two) times daily. 07/25/20   Darrick Grinder, PA  atorvastatin (LIPITOR) 10 MG tablet Take 1 tablet (10 mg total) by mouth daily. 01/23/19   Lezlie Lye, Meda Coffee, MD  azelastine (ASTELIN) 0.1 % nasal spray Place 1 spray into both nostrils 2 (two) times daily. Use in each nostril as directed 05/23/20   [name], NP  Carboxymethylcellul-Glycerin (REFRESH OPTIVE) 1-0.9 % GEL Place 1 drop into both eyes daily.     [provider]  docusate sodium (COLACE) 100 MG capsule Take 1 capsule (100 mg total) by mouth 2 (two) times daily. 07/25/20   Barrie Dunker B, PA  donepezil (ARICEPT) 5 MG tablet TAKE 1 TABLET(5 MG) BY MOUTH AT BEDTIME 09/12/20   Dohmeier, Porfirio Mylar, MD  furosemide (LASIX) 40 MG tablet Take 1 tablet (40 mg total) by mouth daily. 11/17/19   [name], NP  gabapentin (NEURONTIN) 600 MG tablet TAKE 1 TABLET(600 MG) BY MOUTH THREE TIMES DAILY 09/04/19   Lezlie Lye, Meda Coffee, MD  HYDROcodone-acetaminophen (NORCO/VICODIN) 5-325 MG tablet Take 1 tablet by mouth every 6 (six) hours as needed for moderate pain. For chronic pain 07/09/20   [name], NP  hydrOXYzine (ATARAX/VISTARIL) 10 MG tablet TAKE 1 TABLET(10 MG) BY MOUTH THREE TIMES DAILY AS NEEDED 08/18/20   [name], NP  lisinopril (ZESTRIL)  40 MG tablet TAKE 1 TABLET(40 MG) BY MOUTH DAILY 09/02/20   [name], NP  metFORMIN (GLUCOPHAGE) 500 MG tablet TAKE 1 TABLET(500 MG) BY MOUTH TWICE DAILY WITH A MEAL 05/06/20   [name], NP  omeprazole (PRILOSEC) 40 MG capsule TAKE 1 CAPSULE(40 MG) BY MOUTH DAILY 07/01/20   [name], NP  ondansetron (ZOFRAN) 4 MG tablet Take 1 tablet (4 mg total) by mouth every 6 (six) hours as needed for nausea. 07/25/20   Barrie Dunker B, PA  oxyCODONE (OXY IR/ROXICODONE) 5 MG immediate release tablet Take 1-2 tablets (5-10 mg total) by mouth every 4 (four) hours as needed for moderate pain (pain score 4-6). 07/25/20   Darrick Grinder, PA  senna (SENOKOT) 8.6 MG TABS tablet Take 1 tablet (8.6 mg total) by mouth 2 (two) times daily. 07/25/20   Darrick Grinder, PA  traZODone (DESYREL) 50 MG tablet Take 1 tablet (50 mg total) by mouth at bedtime. Must keep upcoming appt (09/12/20) for future refills 09/02/20   Dohmeier, Porfirio Mylar, MD    Allergies    Patient has no known allergies.  Review of Systems   Review of Systems  Constitutional:  Positive for activity change, appetite change, fatigue  and unexpected weight change. Negative for chills and fever.  HENT:  Negative for ear pain and sore throat.   Eyes:  Negative for pain and visual disturbance.  Respiratory:  Negative for cough and shortness of breath.   Cardiovascular:  Negative for chest pain and palpitations.  Gastrointestinal:  Negative for abdominal pain and vomiting.  Genitourinary:  Negative for dysuria and hematuria.  Musculoskeletal:  Negative for arthralgias and back pain.  Skin:  Negative for color change and rash.  Neurological:  Positive for dizziness and headaches. Negative for seizures and syncope.  All other systems reviewed and are negative.  Physical Exam Updated Vital Signs BP (!) 168/89 (BP Location: Left Arm)   Pulse 95   Temp 98.6 F (37 C) (Oral)   Resp 18   Ht 5\' 2"  (1.575 m)   Wt 81.6 kg   SpO2 98%   BMI 32.92 kg/m   Physical Exam Vitals and nursing note reviewed.  Constitutional:      General: She is not in acute distress.    Appearance: She is well-developed.  HENT:     Head: Normocephalic and atraumatic.  Eyes:     Conjunctiva/sclera: Conjunctivae normal.  Cardiovascular:     Rate and Rhythm: Normal rate and regular rhythm.     Heart sounds: No murmur heard. Pulmonary:     Effort: Pulmonary effort is normal. No respiratory distress.     Breath sounds: Normal breath sounds.  Abdominal:     Palpations: Abdomen is soft.     Tenderness: There is no abdominal tenderness.  Musculoskeletal:     Cervical back: Neck supple.  Skin:    General: Skin is warm and dry.  Neurological:     General: No focal deficit present.     Mental Status: She is alert and oriented to person, place, and time. Mental status is at baseline.     Cranial Nerves: No cranial nerve deficit.     Sensory: No sensory deficit.     Motor: No weakness.     Coordination: Coordination normal.    ED Results / Procedures / Treatments   Labs (all labs ordered are listed, but only abnormal results are  displayed) Labs Reviewed  CBC WITH DIFFERENTIAL/PLATELET - Abnormal; Notable for the  following components:      Result Value   Lymphs Abs 4.2 (*)    All other components within normal limits  COMPREHENSIVE METABOLIC PANEL - Abnormal; Notable for the following components:   Potassium 3.0 (*)    Glucose, Bld 169 (*)    BUN 7 (*)    All other components within normal limits  AMMONIA - Abnormal; Notable for the following components:   Ammonia 49 (*)    All other components within normal limits  URINALYSIS, COMPLETE (UACMP) WITH MICROSCOPIC - Abnormal; Notable for the following components:   Color, Urine COLORLESS (*)    Specific Gravity, Urine 1.004 (*)    Hgb urine dipstick SMALL (*)    Bacteria, UA RARE (*)    All other components within normal limits  RESP PANEL BY RT-PCR (FLU A&B, COVID) [id number]  TSH  T4, FREE  LACTIC ACID, PLASMA    EKG EKG Interpretation  Date/Time:  Monday September 23 2020 15:36:08 EDT Ventricular Rate:  81 PR Interval:  130 QRS Duration: 103 QT Interval:  403 QTC Calculation: 468 R Axis:   -66 Text Interpretation: Sinus rhythm Multiple premature complexes, vent & supraven Probable left atrial enlargement Inferior infarct, old Anterior infarct, old Lateral leads are also involved No significant change since last tracing Confirmed by Alvira Monday ([id number]) on 09/23/2020 5:34:31 PM  Radiology CT HEAD WO CONTRAST ( )  Result Date: 09/23/2020 CLINICAL DATA:  Headache EXAM: CT HEAD WITHOUT CONTRAST TECHNIQUE: Contiguous axial images were obtained from the base of the skull through the vertex without intravenous contrast. Sagittal and coronal MPR images reconstructed from axial data set. COMPARISON:  None FINDINGS: Brain: Generalized atrophy. Normal ventricular morphology. No midline shift or mass effect. Normal appearance of brain parenchyma otherwise seen. No intracranial hemorrhage, mass lesion, or evidence of acute infarction. No extra-axial fluid  collections. Vascular: No hyperdense vessels. Mild atherosclerotic calcification of internal carotid arteries at skull base. Skull: Intact Sinuses/Orbits: Opacification of a single anterior ethmoid air cell. Otherwise clear. Other: N/A IMPRESSION: No acute intracranial abnormalities. Electronically Signed   By: Ulyses Southward M.D.   On: 09/23/2020 17:14    Procedures Procedures   Medications Ordered in ED Medications  lactated ringers bolus 1,000 mL (0 mLs Intravenous Stopped 09/23/20 2030)  potassium chloride SA (KLOR-CON) CR tablet 40 mEq (40 mEq Oral Given 09/23/20 1856)  meclizine (ANTIVERT) tablet 25 mg (25 mg Oral Given 09/23/20 2217)  diazepam (VALIUM) injection 2.5 mg (2.5 mg Intravenous Given 09/23/20 2329)    ED Course  I have reviewed the triage vital signs and the nursing notes.  Pertinent labs & imaging results that were available during my care of the patient were reviewed by me and considered in my medical decision making (see chart for details).    MDM Rules/Calculators/A&P                           85 y.o. female with past medical history as above who presents for evaluation of generalized weakness.  Afebrile and hemodynamically stable. No focal neurologic deficits. Normal gait and coordination. CBC unremarkable. CMP with mild hypokalemia, for which she was given repletion. Slight hyperammonemia, non-specific. UA not concerning for UTI or stone. COVID testing negative. TSH and free T4 normal. EKG NSR. CT head without intracranial abnormality.  Presentation is suspicious for dehydration with orthostatic hypotension versus vertigo, favored to be peripheral.  Patient was offered MRI to rule out central causes of vertigo  and initially accepted but later refused scan, stating she would rather return home. Given LR and meclizine then valium with improvement of her dizziness.  Patient will follow-up closely with PCP and neurologist in outpatient setting.  Patient was prescribed a course of  meclizine on discharge.  Final Clinical Impression(s) / ED Diagnoses Final diagnoses:  Vertigo  Nonintractable headache, unspecified chronicity pattern, unspecified headache type  Benign paroxysmal positional vertigo, unspecified laterality    Rx / DC Orders ED Discharge Orders          Ordered    meclizine (ANTIVERT) 50 MG tablet  3 times daily PRN        09/23/20 2337             Holley DexterHayes, [place], MD 09/25/20 1331    Alvira MondaySchlossman, Erin, MD 09/25/20 2350

## 2020-09-23 NOTE — ED Notes (Signed)
EMS picked up patient, paperwork provided

## 2020-09-23 NOTE — ED Notes (Signed)
[facility] made aware of need for patient pickup

## 2020-09-23 NOTE — ED Notes (Signed)
Patient ambulated with steady gait and minimal assist to bathroom

## 2020-09-23 NOTE — ED Notes (Signed)
Per MD, repeat lactic not needed. First lactic 1.6

## 2020-10-03 ENCOUNTER — Telehealth: Payer: Self-pay | Admitting: Registered Nurse

## 2020-10-03 NOTE — Telephone Encounter (Signed)
Patient needs alprazolam sent to [facility] on [address].  Patient is completely out of medication.

## 2020-10-03 NOTE — Telephone Encounter (Signed)
I called and informed patient that I didn't know how long it would take to get approved. Patient understood.

## 2020-10-03 NOTE — Telephone Encounter (Signed)
Requesting:Xanax 0.25mg  Contract: UDS: Last Visit:05/23/20 Next Visit:10/16/20 Last Refill:05/23/20 30 tabs 0 refills  Please Advise

## 2020-10-03 NOTE — Telephone Encounter (Signed)
Patient is requesting an update, states she is completely out.

## 2020-10-04 ENCOUNTER — Other Ambulatory Visit: Payer: Self-pay | Admitting: Registered Nurse

## 2020-10-04 DIAGNOSIS — F411 Generalized anxiety disorder: Secondary | ICD-10-CM

## 2020-10-04 MED ORDER — ALPRAZOLAM 0.25 MG PO TABS: ORAL_TABLET | ORAL | 3 refills | Status: DC

## 2020-10-04 NOTE — Telephone Encounter (Signed)
Sent,  Thanks,  Rich

## 2020-10-08 ENCOUNTER — Other Ambulatory Visit: Payer: Self-pay

## 2020-10-08 DIAGNOSIS — M170 Bilateral primary osteoarthritis of knee: Secondary | ICD-10-CM

## 2020-10-08 MED ORDER — HYDROCODONE-ACETAMINOPHEN 5-325 MG PO TABS: 1.0000 | ORAL_TABLET | Freq: Four times a day (QID) | ORAL | 0 refills | Status: DC | PRN

## 2020-10-08 NOTE — Telephone Encounter (Signed)
Pt needs refill on HYDROcodone-acetaminophen (NORCO/VICODIN) 5-325 MG tablet [[id number]]   [id number] - [place], [place] - [address] AT [facility] [address]   Pt call back [phone]

## 2020-10-08 NOTE — Telephone Encounter (Signed)
Requesting:Hydrocodone Contract: UDS: Last Visit:05/23/20 Next Visit:10/16/20 Last Refill:07/09/20 90 tabs 0 refills  Please Advise

## 2020-10-09 ENCOUNTER — Other Ambulatory Visit: Payer: Self-pay | Admitting: Registered Nurse

## 2020-10-09 DIAGNOSIS — Z1231 Encounter for screening mammogram for malignant neoplasm of breast: Secondary | ICD-10-CM

## 2020-10-16 ENCOUNTER — Ambulatory Visit (INDEPENDENT_AMBULATORY_CARE_PROVIDER_SITE_OTHER): Payer: Medicare Other | Admitting: Registered Nurse

## 2020-10-16 ENCOUNTER — Encounter: Payer: Self-pay | Admitting: Registered Nurse

## 2020-10-16 ENCOUNTER — Other Ambulatory Visit: Payer: Self-pay

## 2020-10-16 VITALS — BP 137/68 | HR 79 | Temp 98.0°F | Resp 18 | Ht 62.0 in | Wt 178.6 lb

## 2020-10-16 DIAGNOSIS — Z23 Encounter for immunization: Secondary | ICD-10-CM | POA: Diagnosis not present

## 2020-10-16 DIAGNOSIS — F411 Generalized anxiety disorder: Secondary | ICD-10-CM

## 2020-10-16 DIAGNOSIS — E1129 Type 2 diabetes mellitus with other diabetic kidney complication: Secondary | ICD-10-CM

## 2020-10-16 DIAGNOSIS — R809 Proteinuria, unspecified: Secondary | ICD-10-CM

## 2020-10-16 DIAGNOSIS — I10 Essential (primary) hypertension: Secondary | ICD-10-CM

## 2020-10-16 DIAGNOSIS — Z0000 Encounter for general adult medical examination without abnormal findings: Secondary | ICD-10-CM

## 2020-10-16 DIAGNOSIS — B9689 Other specified bacterial agents as the cause of diseases classified elsewhere: Secondary | ICD-10-CM

## 2020-10-16 DIAGNOSIS — J019 Acute sinusitis, unspecified: Secondary | ICD-10-CM | POA: Diagnosis not present

## 2020-10-16 DIAGNOSIS — Z0001 Encounter for general adult medical examination with abnormal findings: Secondary | ICD-10-CM

## 2020-10-16 LAB — LIPID PANEL
Cholesterol: 252 mg/dL — ABNORMAL HIGH (ref 0–200)
HDL: 77.7 mg/dL (ref >39.00)
LDL Cholesterol: 152 mg/dL — ABNORMAL HIGH (ref 0–99)
NonHDL: 174.49
Total CHOL/HDL Ratio: 3
Triglycerides: 113 mg/dL (ref 0.0–149.0)
VLDL: 22.6 mg/dL (ref 0.0–40.0)

## 2020-10-16 LAB — COMPREHENSIVE METABOLIC PANEL
ALT: 13 U/L (ref 0–35)
AST: 17 U/L (ref 0–37)
Albumin: 4.2 g/dL (ref 3.5–5.2)
Alkaline Phosphatase: 73 U/L (ref 39–117)
BUN: 9 mg/dL (ref 6–23)
CO2: 29 mEq/L (ref 19–32)
Calcium: 9.8 mg/dL (ref 8.4–10.5)
Chloride: 100 mEq/L (ref 96–112)
Creatinine, Ser: 0.78 mg/dL (ref 0.40–1.20)
GFR: 70.11 mL/min (ref >60.00)
Glucose, Bld: 128 mg/dL — ABNORMAL HIGH (ref 70–99)
Potassium: 3.4 mEq/L — ABNORMAL LOW (ref 3.5–5.1)
Sodium: 139 mEq/L (ref 135–145)
Total Bilirubin: 0.4 mg/dL (ref 0.2–1.2)
Total Protein: 7.4 g/dL (ref 6.0–8.3)

## 2020-10-16 LAB — CBC WITH DIFFERENTIAL/PLATELET
Basophils Absolute: 0 10*3/uL (ref 0.0–0.1)
Basophils Relative: 0.9 % (ref 0.0–3.0)
Eosinophils Absolute: 0.1 10*3/uL (ref 0.0–0.7)
Eosinophils Relative: 1.2 % (ref 0.0–5.0)
HCT: 34.7 % — ABNORMAL LOW (ref 36.0–46.0)
Hemoglobin: 11.3 g/dL — ABNORMAL LOW (ref 12.0–15.0)
Lymphocytes Relative: 51 % — ABNORMAL HIGH (ref 12.0–46.0)
Lymphs Abs: 2.8 10*3/uL (ref 0.7–4.0)
MCHC: 32.6 g/dL (ref 30.0–36.0)
MCV: 82.1 fl (ref 78.0–100.0)
Monocytes Absolute: 0.5 10*3/uL (ref 0.1–1.0)
Monocytes Relative: 9.6 % (ref 3.0–12.0)
Neutro Abs: 2 10*3/uL (ref 1.4–7.7)
Neutrophils Relative %: 37.3 % — ABNORMAL LOW (ref 43.0–77.0)
Platelets: 311 10*3/uL (ref 150.0–400.0)
RBC: 4.22 Mil/uL (ref 3.87–5.11)
RDW: 16 % — ABNORMAL HIGH (ref 11.5–15.5)
WBC: 5.5 10*3/uL (ref 4.0–10.5)

## 2020-10-16 LAB — GLUCOSE, POCT (MANUAL RESULT ENTRY): POC Glucose: 137 mg/dl — AB (ref 70–99)

## 2020-10-16 LAB — HEMOGLOBIN A1C: Hgb A1c MFr Bld: 7.4 % — ABNORMAL HIGH (ref 4.6–6.5)

## 2020-10-16 MED ORDER — HYDROXYZINE HCL 10 MG PO TABS: ORAL_TABLET | ORAL | 1 refills | Status: DC

## 2020-10-16 MED ORDER — AMOXICILLIN-POT CLAVULANATE 875-125 MG PO TABS
1.0000 | ORAL_TABLET | Freq: Two times a day (BID) | ORAL | 0 refills | Status: DC
Start: 2020-10-16 — End: 2020-11-12

## 2020-10-16 NOTE — Patient Instructions (Addendum)
Ms. Tomasita Beevers to see you  Sinus infection - take augmentin twice daily for a week. May loosen stools. Eat right and drink plenty of water.  Labs today will be back tomorrow. I'll call you if there are any worries   I'm excited about your new knee.  See you in 6 months.  Thank you  Rich     If you have lab work done today you will be contacted with your lab results within the next 2 weeks.  If you have not heard from [name] then please contact us. The fastest way to get your results is to register for My Chart.   IF you received an x-ray today, you will receive an invoice from [facility]. Please contact [facility] at [phone] with questions or concerns regarding your invoice.   IF you received labwork today, you will receive an invoice from [facility]. Please contact [facility] at [phone] with questions or concerns regarding your invoice.   Our billing staff will not be able to assist you with questions regarding bills from these companies.  You will be contacted with the lab results as soon as they are available. The fastest way to get your results is to activate your My Chart account. Instructions are located on the last page of this paperwork. If you have not heard from [name] regarding the results in 2 weeks, please contact this office.

## 2020-10-16 NOTE — Progress Notes (Signed)
Established Patient Office Visit  Subjective:  Patient ID: Casey [name], female    DOB: 1935/01/22  Age: 85 y.o. MRN: [id number]  CC:  Chief Complaint  Patient presents with   Annual Exam    Patient states she is here for a physical and discuss her cough.    HPI Casey [name] presents for annual visit  t2dm Last A1c:  Lab Results  Component Value Date   HGBA1C 6.6 (H) 07/18/2020    Currently taking: metformin 500mg  po bid ac. No new complications Reports good compliance with medications Diet has been steady Exercise habits have been limited.  Hypertension: Patient Currently taking: amlodipine 5mg  po qd, furosemide 40mg  po qd, lisniopril 40mg  po qd Good effect. No AEs. Denies CV symptoms including: chest pain, shob, doe, headache, visual changes, fatigue, claudication, and dependent edema.   Previous readings and labs: BP Readings from Last 3 Encounters:  10/16/20 137/68  09/23/20 (!) 168/89  09/23/20 (!) 165/73   Lab Results  Component Value Date   CREATININE 0.79 09/23/2020     Hld Taking atorvastatin 10mg  po qd Good effect, no AE Lab Results  Component Value Date   CHOL 175 07/17/2019   HDL 87 07/17/2019   LDLCALC 78 07/17/2019   TRIG 47 07/17/2019   CHOLHDL 2.0 07/17/2019    Sinus pressure Ongoing 1-2 weeks Frontal sinuses Nasal congestion Some pnd OTCs not helpful No covid exposure.    Past Medical History:  Diagnosis Date   ALLERGIC RHINITIS    Allergy    ANXIETY    ARM PAIN, RIGHT    BENIGN POSITIONAL VERTIGO    Carcinoma in situ of colon    CONSTIPATION    CYSTOCELE WITH INCOMPLETE UTERINE PROLAPSE    Diabetes mellitus without complication (HCC)    ESOPHAGEAL STRICTURE    FATIGUE    FLANK PAIN, LEFT    Headache(784.0)    HIP PAIN, LEFT    HYPERCHOLESTEROLEMIA    HYPERGLYCEMIA    HYPERTENSION, BENIGN ESSENTIAL 03/03/2007   LEG EDEMA, BILATERAL    Microscopic hematuria    OBESITY    OSTEOARTHRITIS, KNEE    OSTEOPOROSIS     Peripheral neuropathy, idiopathic    Problems with swallowing and mastication    SINUSITIS, RECURRENT    SPRAIN&STRAIN OTH SPEC SITES SHOULDER&UPPER ARM     Past Surgical History:  Procedure Laterality Date   COLONOSCOPY     KNEE ARTHROPLASTY Right 07/24/2020   Procedure: COMPUTER ASSISTED TOTAL KNEE ARTHROPLASTY;  Surgeon: 07/19/2019, MD;  Location: [facility];  Service: Orthopedics;  Laterality: Right;   LEFT HEART CATHETERIZATION WITH CORONARY ANGIOGRAM N/A 12/02/2010   Procedure: LEFT HEART CATHETERIZATION WITH CORONARY ANGIOGRAM;  Surgeon: Peter M 07/19/2019, MD;  Location: [facility] CATH LAB;  Service: Cardiovascular;:: NORMAL CORONARY ARTERIES - NORMAL LV FUNCTION & EDP.     TRANSTHORACIC ECHOCARDIOGRAM  11/2010   Normal EF 60-65%. No RWMA.  Mild LAD dilation c/w Gr 1 DD.  -- PRETTY NORMAL ECHO    Family History  Problem Relation Age of Onset   Coronary artery disease Mother 67   Cancer Father 59   Heart disease Father    Colon cancer Other    Diabetes Other    Coronary artery disease Sister    Diabetes type II Sister    Cancer Sister    Coronary artery disease Brother    Kidney disease Brother    Coronary artery disease Brother    Breast cancer Neg Hx  Social History   Socioeconomic History   Marital status: Single    Spouse name: Not on file   Number of children: 1   Years of education: 12   Highest education level: High school graduate  Occupational History   Not on file  Tobacco Use   Smoking status: Never   Smokeless tobacco: Never  Vaping Use   Vaping Use: Never used  Substance and Sexual Activity   Alcohol use: No   Drug use: No   Sexual activity: Not Currently  Other Topics Concern   Not on file  Social History Narrative   She tries to stay active, but over the last year (20 19-20 20), she has been very limited by her right knee pain.   Is able to go grocery shopping.   Social Determinants of Health   Financial Resource Strain: Not on file  Food  Insecurity: Not on file  Transportation Needs: Not on file  Physical Activity: Not on file  Stress: Not on file  Social Connections: Not on file  Intimate Partner Violence: Not on file    Outpatient Medications Prior to Visit  Medication Sig Dispense Refill   ALPRAZolam (XANAX) 0.25 MG tablet TAKE 1 TABLET(0.25 MG) BY MOUTH AT BEDTIME AS NEEDED FOR ANXIETY 30 tablet 3   amLODipine (NORVASC) 5 MG tablet TAKE 1 TABLET(5 MG) BY MOUTH DAILY 90 tablet 0   aspirin 81 MG chewable tablet Chew 1 tablet (81 mg total) by mouth 2 (two) times daily.     atorvastatin (LIPITOR) 10 MG tablet Take 1 tablet (10 mg total) by mouth daily. 90 tablet 3   azelastine (ASTELIN) 0.1 % nasal spray Place 1 spray into both nostrils 2 (two) times daily. Use in each nostril as directed 30 mL 12   Carboxymethylcellul-Glycerin (REFRESH OPTIVE) 1-0.9 % GEL Place 1 drop into both eyes daily.     docusate sodium (COLACE) 100 MG capsule Take 1 capsule (100 mg total) by mouth 2 (two) times daily. 10 capsule 0   donepezil (ARICEPT) 5 MG tablet TAKE 1 TABLET(5 MG) BY MOUTH AT BEDTIME 90 tablet 0   furosemide (LASIX) 40 MG tablet Take 1 tablet (40 mg total) by mouth daily. 90 tablet 1   gabapentin (NEURONTIN) 600 MG tablet TAKE 1 TABLET(600 MG) BY MOUTH THREE TIMES DAILY 270 tablet 2   HYDROcodone-acetaminophen (NORCO/VICODIN) 5-325 MG tablet Take 1 tablet by mouth every 6 (six) hours as needed for moderate pain. For chronic pain 90 tablet 0   lisinopril (ZESTRIL) 40 MG tablet TAKE 1 TABLET(40 MG) BY MOUTH DAILY 90 tablet 0   meclizine (ANTIVERT) 50 MG tablet Take 1 tablet (50 mg total) by mouth 3 (three) times daily as needed. 30 tablet 0   metFORMIN (GLUCOPHAGE) 500 MG tablet TAKE 1 TABLET(500 MG) BY MOUTH TWICE DAILY WITH A MEAL 180 tablet 1   omeprazole (PRILOSEC) 40 MG capsule TAKE 1 CAPSULE(40 MG) BY MOUTH DAILY 30 capsule 5   ondansetron (ZOFRAN) 4 MG tablet Take 1 tablet (4 mg total) by mouth every 6 (six) hours as needed  for nausea. 20 tablet 0   oxyCODONE (OXY IR/ROXICODONE) 5 MG immediate release tablet Take 1-2 tablets (5-10 mg total) by mouth every 4 (four) hours as needed for moderate pain (pain score 4-6). 40 tablet 0   senna (SENOKOT) 8.6 MG TABS tablet Take 1 tablet (8.6 mg total) by mouth 2 (two) times daily. 120 tablet 0   traZODone (DESYREL) 50 MG tablet Take 1  tablet (50 mg total) by mouth at bedtime. Must keep upcoming appt (09/12/20) for future refills 30 tablet 1   hydrOXYzine (ATARAX/VISTARIL) 10 MG tablet TAKE 1 TABLET(10 MG) BY MOUTH THREE TIMES DAILY AS NEEDED 90 tablet 0   No facility-administered medications prior to visit.    No Known Allergies  ROS Review of Systems  Constitutional: Negative.   HENT: Negative.    Eyes: Negative.   Respiratory: Negative.    Cardiovascular: Negative.   Gastrointestinal: Negative.   Genitourinary: Negative.   Musculoskeletal: Negative.   Skin: Negative.   Neurological: Negative.   Psychiatric/Behavioral: Negative.    All other systems reviewed and are negative.    Objective:    Physical Exam Vitals and nursing note reviewed.  Constitutional:      General: She is not in acute distress.    Appearance: Normal appearance. She is normal weight. She is not ill-appearing, toxic-appearing or diaphoretic.  HENT:     Head: Normocephalic and atraumatic.     Right Ear: Tympanic membrane, ear canal and external ear normal. There is no impacted cerumen.     Left Ear: Tympanic membrane, ear canal and external ear normal. There is no impacted cerumen.     Nose: Nose normal. No congestion or rhinorrhea.     Mouth/Throat:     Mouth: Mucous membranes are moist.     Pharynx: Oropharynx is clear. No oropharyngeal exudate or posterior oropharyngeal erythema.  Eyes:     General: No scleral icterus.       Right eye: No discharge.        Left eye: No discharge.     Extraocular Movements: Extraocular movements intact.     Conjunctiva/sclera: Conjunctivae  normal.     Pupils: Pupils are equal, round, and reactive to light.  Cardiovascular:     Rate and Rhythm: Normal rate and regular rhythm.     Pulses: Normal pulses.     Heart sounds: Normal heart sounds. No murmur heard.   No friction rub. No gallop.  Pulmonary:     Effort: Pulmonary effort is normal. No respiratory distress.     Breath sounds: Normal breath sounds. No stridor. No wheezing, rhonchi or rales.  Chest:     Chest wall: No tenderness.  Abdominal:     General: Abdomen is flat. Bowel sounds are normal. There is no distension.     Palpations: Abdomen is soft. There is no mass.     Tenderness: There is no abdominal tenderness. There is no right CVA tenderness, left CVA tenderness, guarding or rebound.     Hernia: No hernia is present.  Musculoskeletal:        General: No swelling, tenderness, deformity or signs of injury. Normal range of motion.     Right lower leg: No edema.     Left lower leg: No edema.  Skin:    General: Skin is warm and dry.     Capillary Refill: Capillary refill takes less than 2 seconds.     Coloration: Skin is not jaundiced or pale.     Findings: No bruising, erythema, lesion or rash.  Neurological:     General: No focal deficit present.     Mental Status: She is alert and oriented to person, place, and time. Mental status is at baseline.     Cranial Nerves: No cranial nerve deficit.     Sensory: No sensory deficit.     Motor: No weakness.     Coordination: Coordination normal.  Gait: Gait normal.     Deep Tendon Reflexes: Reflexes normal.  Psychiatric:        Mood and Affect: Mood normal.        Behavior: Behavior normal.        Thought Content: Thought content normal.        Judgment: Judgment normal.    BP 137/68   Pulse 79   Temp 98 F (36.7 C) (Temporal)   Resp 18   Ht 5\' 2"  (1.575 m)   Wt 178 lb 9.6 oz (81 kg)   BMI 32.67 kg/m  Wt Readings from Last 3 Encounters:  10/16/20 178 lb 9.6 oz (81 kg)  09/23/20 180 lb (81.6 kg)   09/12/20 184 lb 8 oz (83.7 kg)     Health Maintenance Due  Topic Date Due   OPHTHALMOLOGY EXAM  11/24/2019   INFLUENZA VACCINE  08/19/2020    There are no preventive care reminders to display for this patient.  Lab Results  Component Value Date   TSH 1.544 09/23/2020   Lab Results  Component Value Date   WBC 9.7 09/23/2020   HGB 12.7 09/23/2020   HCT 38.6 09/23/2020   MCV 82.5 09/23/2020   PLT 372 09/23/2020   Lab Results  Component Value Date   NA 137 09/23/2020   K 3.0 (L) 09/23/2020   CO2 26 09/23/2020   GLUCOSE 169 (H) 09/23/2020   BUN 7 (L) 09/23/2020   CREATININE 0.79 09/23/2020   BILITOT 0.8 09/23/2020   ALKPHOS 77 09/23/2020   AST 23 09/23/2020   ALT 14 09/23/2020   PROT 7.8 09/23/2020   ALBUMIN 4.2 09/23/2020   CALCIUM 9.9 09/23/2020   ANIONGAP 13 09/23/2020   Lab Results  Component Value Date   CHOL 175 07/17/2019   Lab Results  Component Value Date   HDL 87 07/17/2019   Lab Results  Component Value Date   LDLCALC 78 07/17/2019   Lab Results  Component Value Date   TRIG 47 07/17/2019   Lab Results  Component Value Date   CHOLHDL 2.0 07/17/2019   Lab Results  Component Value Date   HGBA1C 6.6 (H) 07/18/2020      Assessment & Plan:   Problem List Items Addressed This Visit       Cardiovascular and Mediastinum   HYPERTENSION, BENIGN ESSENTIAL (Chronic)   Relevant Orders   CBC with Differential/Platelet   Comprehensive metabolic panel     Endocrine   Type 2 diabetes mellitus with microalbuminuria, without long-term current use of insulin (HCC) (Chronic)   Relevant Orders   POCT glucose (manual entry) (Completed)   Lipid panel   Hemoglobin A1c     Other   GAD (generalized anxiety disorder)   Relevant Medications   hydrOXYzine (ATARAX/VISTARIL) 10 MG tablet   Other Visit Diagnoses     Medicare annual wellness visit, subsequent    -  Primary   Flu vaccine need       Relevant Orders   Flu Vaccine QUAD High Dose(Fluad)    Acute bacterial sinusitis       Relevant Medications   amoxicillin-clavulanate (AUGMENTIN) 875-125 MG tablet       Meds ordered this encounter  Medications   hydrOXYzine (ATARAX/VISTARIL) 10 MG tablet    Sig: TAKE 1 TABLET(10 MG) BY MOUTH THREE TIMES DAILY AS NEEDED    Dispense:  90 tablet    Refill:  1   amoxicillin-clavulanate (AUGMENTIN) 875-125 MG tablet    Sig: Take 1  tablet by mouth 2 (two) times daily.    Dispense:  14 tablet    Refill:  0    Order Specific Question:   Supervising Provider    Answer:   Neva Seat, JEFFREY R [[id number]]    Follow-up: Return in about 6 months (around 04/15/2021) for t2dm, htn.   PLAN Refill hydroxyzine Labs collected. Will follow up with the patient as warranted. Glucose on site today, random, 137. Reassuring Flu vaccine given Augmentin for sinus infection Shingle sshot sent to pharmacy Patient encouraged to call clinic with any questions, comments, or concerns.  [name], NP

## 2020-10-24 ENCOUNTER — Telehealth: Payer: Self-pay | Admitting: Registered Nurse

## 2020-10-24 NOTE — Chronic Care Management (AMB) (Signed)
  Chronic Care Management   Outreach Note  10/24/2020 Name: Casey [name] MRN: [id number] DOB: 12-20-1935  Referred by: [name], NP Reason for referral : No chief complaint on file.   An unsuccessful telephone outreach was attempted today. The patient was referred to the pharmacist for assistance with care management and care coordination.   Follow Up Plan:   Tatjana Dellinger Upstream Scheduler

## 2020-10-24 NOTE — Progress Notes (Signed)
  Chronic Care Management   Note  10/24/2020 Name: NEESA KNAPIK MRN: [id number] DOB: 07-Apr-1935  [name] is a 85 y.o. year old female who is a primary care patient of [name], NP. I reached out to [name] by phone today in response to a referral sent by Ms. Rexene Alberts Mcglaun's PCP, [name], NP.   Ms. Marion was given information about Chronic Care Management services today including:  CCM service includes personalized support from designated clinical staff supervised by her physician, including individualized plan of care and coordination with other care providers 24/7 contact phone numbers for assistance for urgent and routine care needs. Service will only be billed when office clinical staff spend 20 minutes or more in a month to coordinate care. Only one practitioner may furnish and bill the service in a calendar month. The patient may stop CCM services at any time (effective at the end of the month) by phone call to the office staff.   Patient agreed to services and verbal consent obtained.   Follow up plan:   Tatjana Restaurant manager, fast food

## 2020-11-01 ENCOUNTER — Other Ambulatory Visit: Payer: Self-pay | Admitting: Registered Nurse

## 2020-11-01 DIAGNOSIS — R809 Proteinuria, unspecified: Secondary | ICD-10-CM

## 2020-11-01 DIAGNOSIS — E1129 Type 2 diabetes mellitus with other diabetic kidney complication: Secondary | ICD-10-CM

## 2020-11-06 ENCOUNTER — Other Ambulatory Visit: Payer: Self-pay | Admitting: Registered Nurse

## 2020-11-06 DIAGNOSIS — M170 Bilateral primary osteoarthritis of knee: Secondary | ICD-10-CM

## 2020-11-06 NOTE — Telephone Encounter (Signed)
Patient is requesting a refill of the following medications: Requested Prescriptions   Pending Prescriptions Disp Refills   HYDROcodone-acetaminophen (NORCO/VICODIN) 5-325 MG tablet 90 tablet 0    Sig: Take 1 tablet by mouth every 6 (six) hours as needed for moderate pain. For chronic pain   Date of patient request: 11/06/2020 Last office visit: 10/16/2020 Date of last refill: 10/08/2020 Last refill amount:90 tablets Follow up time period per chart: 11/21/2020

## 2020-11-06 NOTE — Telephone Encounter (Signed)
..  Caller name:Casey [name] callback #:   [phone]  Encourage patient to contact the pharmacy for refills or they can request refills through [facility]  LAST APPOINTMENT DATE:  (Please schedule appointment if longer than 1 year)  NEXT APPOINTMENT DATE: 11/21/2020  MEDICATION NAME & DOSE  Hydrocodone   Is the patient out of medication? YES/NO: No   PHARMACY: [facility] on [address]  Let patient know to contact pharmacy at the end of the day to make sure medication is ready.  Please notify patient to allow 48-72 hours to process  (CLINICAL TO FILL OR ROUTE PER PROTOCOLS)

## 2020-11-07 MED ORDER — HYDROCODONE-ACETAMINOPHEN 5-325 MG PO TABS: 1.0000 | ORAL_TABLET | Freq: Four times a day (QID) | ORAL | 0 refills | Status: DC | PRN

## 2020-11-11 ENCOUNTER — Telehealth: Payer: Self-pay

## 2020-11-11 NOTE — Progress Notes (Signed)
Chronic Care Management Pharmacy Assistant   Name: Casey [name]  MRN: [id number] DOB: [birth year]  Casey [name] is an 85 y.o. year old female who presents for her initial CCM visit with the clinical pharmacist.  Reason for Encounter: Chart Prep -  for Initial CPP Visit    Recent office visits:  10/16/20 Medicare Wellness Visit   05/23/20 Freddy Finner (PCP) - Family Medicine - Diabetes - Labs were ordered. azelastine (ASTELIN) 0.1 % nasal spray prescribed. Follow up in 6 months.    Recent consult visits:  09/12/20 Melvyn Novas, MD - Neurology - donepezil (ARICEPT) 5 MG tablet Take 1 tablet (5 mg total) by mouth at bedtime. MR BRAIN WO CONTRAST ordered.   06/14/20 Ander Purpura, MD - Diabetes - No notes available.  06/04/20 Samson Frederic, MD - Orthopedic Surgery - No notes available.   05/29/20 Norris Cross, MD - No notes available.   05/22/20 Norris Cross, MD - No notes available.  05/14/20 Norris Cross, MD - No notes available.   Hospital visits: 09/23/20  Medication Reconciliation was completed by comparing discharge summary, patient's EMR and Pharmacy list, and upon discussion with patient.  Admitted to the hospital on 09/23/20 due to Vertigo / Elevated Blood pressure. Discharge date was 09/24/20. Discharged from [facility].    New?Medications Started at [facility] Discharge:?? Meclizine (ANTIVERT) 50 MG tablet Take 1 tablet (50 mg total) by mouth 3 (three) times daily as needed prescribed.   Medication Changes at Hospital Discharge: No changes noted.   Medications Discontinued at Hospital Discharge: No medications discontinued at discharge.  Medications that remain the same after Hospital Discharge:??  All other medications will remain the same.    Hospital visits: 07/24/20  Medication Reconciliation was completed by comparing discharge summary, patient's EMR and Pharmacy list, and upon discussion with patient.  Admitted to the hospital on  due to 07/24/20.  Discharge date was 07/26/20. Discharged from [facility].    New?Medications Started at [facility] Discharge:?? oxyCODONE (OXY IR/ROXICODONE) 5 MG immediate release tablet, senna (SENOKOT) 8.6 MG TABS tablet, ondansetron (ZOFRAN) 4 MG tablet,   Medication Changes at Hospital Discharge: No changes noted.   Medications Discontinued at Hospital Discharge: No medication discontinued at discharge.  Medications that remain the same after Hospital Discharge:??  All other medications will remain the same.    Medications: Outpatient Encounter Medications as of 11/11/2020  Medication Sig   ALPRAZolam (XANAX) 0.25 MG tablet TAKE 1 TABLET(0.25 MG) BY MOUTH AT BEDTIME AS NEEDED FOR ANXIETY   amLODipine (NORVASC) 5 MG tablet TAKE 1 TABLET(5 MG) BY MOUTH DAILY   amoxicillin-clavulanate (AUGMENTIN) 875-125 MG tablet Take 1 tablet by mouth 2 (two) times daily.   aspirin 81 MG chewable tablet Chew 1 tablet (81 mg total) by mouth 2 (two) times daily.   atorvastatin (LIPITOR) 10 MG tablet Take 1 tablet (10 mg total) by mouth daily.   azelastine (ASTELIN) 0.1 % nasal spray Place 1 spray into both nostrils 2 (two) times daily. Use in each nostril as directed   Carboxymethylcellul-Glycerin (REFRESH OPTIVE) 1-0.9 % GEL Place 1 drop into both eyes daily.   docusate sodium (COLACE) 100 MG capsule Take 1 capsule (100 mg total) by mouth 2 (two) times daily.   donepezil (ARICEPT) 5 MG tablet TAKE 1 TABLET(5 MG) BY MOUTH AT BEDTIME   furosemide (LASIX) 40 MG tablet Take 1 tablet (40 mg total) by mouth daily.   gabapentin (NEURONTIN) 600 MG tablet TAKE 1 TABLET(600 MG)  BY MOUTH THREE TIMES DAILY   HYDROcodone-acetaminophen (NORCO/VICODIN) 5-325 MG tablet Take 1 tablet by mouth every 6 (six) hours as needed for moderate pain. For chronic pain   hydrOXYzine (ATARAX/VISTARIL) 10 MG tablet TAKE 1 TABLET(10 MG) BY MOUTH THREE TIMES DAILY AS NEEDED   lisinopril (ZESTRIL) 40 MG tablet TAKE 1 TABLET(40 MG) BY MOUTH  DAILY   meclizine (ANTIVERT) 50 MG tablet Take 1 tablet (50 mg total) by mouth 3 (three) times daily as needed.   metFORMIN (GLUCOPHAGE) 500 MG tablet TAKE 1 TABLET(500 MG) BY MOUTH TWICE DAILY WITH A MEAL   omeprazole (PRILOSEC) 40 MG capsule TAKE 1 CAPSULE(40 MG) BY MOUTH DAILY   ondansetron (ZOFRAN) 4 MG tablet Take 1 tablet (4 mg total) by mouth every 6 (six) hours as needed for nausea.   oxyCODONE (OXY IR/ROXICODONE) 5 MG immediate release tablet Take 1-2 tablets (5-10 mg total) by mouth every 4 (four) hours as needed for moderate pain (pain score 4-6).   senna (SENOKOT) 8.6 MG TABS tablet Take 1 tablet (8.6 mg total) by mouth 2 (two) times daily.   traZODone (DESYREL) 50 MG tablet Take 1 tablet (50 mg total) by mouth at bedtime. Must keep upcoming appt (09/12/20) for future refills   No facility-administered encounter medications on file as of 11/11/2020.     Have you seen any other providers since your last visit? no  Any changes in your medications or health? no  Any side effects from any medications? no  Do you have an symptoms or problems not managed by your medications? Yes. She reported her blood sugar has been elevated recently.   Any concerns about your health right now? She reports she is still having issues with her knee after knee replacement in July.   Has your provider asked that you check blood pressure, blood sugar, or follow special diet at home? No. Has recently thought about getting a glucometer.   Do you get any type of exercise on a regular basis? Yes. Patient reports she is active and walks around the house a lot.   Can you think of a goal you would like to reach for your health? Work on her knee pain and her blood sugars.   Do you have any problems getting your medications? no  Is there anything that you would like to discuss during the appointment? Patient would like help getting a glucometer.   Please bring medications and supplements to appointment   Patient confirmed appointment and time.    Care Gaps  AWV: done 10/16/20 Colonoscopy: unknown  DM Eye Exam: overdue 11/24/19 DM Foot Exam: due 11/16/20 Microalbumin: never HbgAIC: done 10/16/20 (7.4)  DEXA: done 03/18/18 Mammogram: scheduled 11/14/20  Star Rating Drugs: metFORMIN (GLUCOPHAGE) 500 MG tablet - last filled 11/01/20 90 days  lisinopril (ZESTRIL) 40 MG tablet - last filled 09/02/20 90 days  atorvastatin (LIPITOR) 10 MG tablet - last filled 10/07/20 90 days   Future Appointments  Date Time Provider Department Center  11/12/2020 11:00 AM [facility] PHARMACIST [facility]  11/14/2020  9:20 AM [facility] MM 2 [facility]  11/21/2020 10:50 AM [name], NP [facility]  03/10/2021  3:00 PM Butch Penny, NP [facility] None  04/15/2021  9:10 AM [name], NP [facility]    Eugenie Filler, CCMA Clinical Pharmacist Assistant  [phone]  Time Spent: 70 minutes

## 2020-11-12 ENCOUNTER — Ambulatory Visit (INDEPENDENT_AMBULATORY_CARE_PROVIDER_SITE_OTHER): Payer: Medicare Other

## 2020-11-12 DIAGNOSIS — I10 Essential (primary) hypertension: Secondary | ICD-10-CM

## 2020-11-12 DIAGNOSIS — R809 Proteinuria, unspecified: Secondary | ICD-10-CM

## 2020-11-12 DIAGNOSIS — E7800 Pure hypercholesterolemia, unspecified: Secondary | ICD-10-CM

## 2020-11-12 DIAGNOSIS — E1129 Type 2 diabetes mellitus with other diabetic kidney complication: Secondary | ICD-10-CM

## 2020-11-12 NOTE — Progress Notes (Signed)
Chronic Care Management Pharmacy Note  11/13/2020 Name:  Casey [name] MRN:  [id number] DOB:  [birth year]  Summary: -Needing Rx for atorvastatin 10 mg once daily  Subjective: Casey [name] is an 85 y.o. year old female who is a primary patient of Maximiano Coss, NP.  The CCM team was consulted for assistance with disease management and care coordination needs.    Engaged with patient by telephone for initial visit in response to provider referral for pharmacy case management and/or care coordination services.   Consent to Services:  The patient was given the following information about Chronic Care Management services today, agreed to services, and gave verbal consent: 1. CCM service includes personalized support from designated clinical staff supervised by the primary care provider, including individualized plan of care and coordination with other care providers 2. 24/7 contact phone numbers for assistance for urgent and routine care needs. 3. Service will only be billed when office clinical staff spend 20 minutes or more in a month to coordinate care. 4. Only one practitioner may furnish and bill the service in a calendar month. 5.The patient may stop CCM services at any time (effective at the end of the month) by phone call to the office staff. 6. The patient will be responsible for cost sharing (co-pay) of up to 20% of the service fee (after annual deductible is met). Patient agreed to services and consent obtained.  Patient Care Team: Maximiano Coss, NP as PCP - General (Adult Health Nurse Practitioner) Leonie Man, MD as PCP - Cardiology (Cardiology) Rutherford Guys, MD as Consulting Physician (Ophthalmology) [name], [facility] as Pharmacist (Pharmacist)  Objective:  Lab Results  Component Value Date   CREATININE 0.78 10/16/2020   CREATININE 0.79 09/23/2020   CREATININE 0.82 07/25/2020    Lab Results  Component Value Date   HGBA1C 7.4 (H) 10/16/2020   Last diabetic  Eye exam:  Lab Results  Component Value Date/Time   HMDIABEYEEXA No Retinopathy 01/29/2017 12:00 AM    Last diabetic Foot exam: No results found for: HMDIABFOOTEX      Component Value Date/Time   CHOL 252 (H) 10/16/2020 0930   CHOL 175 07/17/2019 1009   TRIG 113.0 10/16/2020 0930   HDL 77.70 10/16/2020 0930   HDL 87 07/17/2019 1009   CHOLHDL 3 10/16/2020 0930   VLDL 22.6 10/16/2020 0930   LDLCALC 152 (H) 10/16/2020 0930   LDLCALC 78 07/17/2019 1009    Hepatic Function Latest Ref Rng & Units 10/16/2020 09/23/2020 07/18/2020  Total Protein 6.0 - 8.3 g/dL 7.4 7.8 7.6  Albumin 3.5 - 5.2 g/dL 4.2 4.2 4.2  AST 0 - 37 U/L '17 23 17  ' ALT 0 - 35 U/L '13 14 12  ' Alk Phosphatase 39 - 117 U/L 73 77 77  Total Bilirubin 0.2 - 1.2 mg/dL 0.4 0.8 0.6    Lab Results  Component Value Date/Time   TSH 1.544 09/23/2020 06:55 PM   TSH 1.300 07/17/2019 10:09 AM   TSH 1.450 10/20/2018 10:47 AM   FREET4 0.94 09/23/2020 06:55 PM    CBC Latest Ref Rng & Units 10/16/2020 09/23/2020 07/26/2020  WBC 4.0 - 10.5 K/uL 5.5 9.7 11.9(H)  Hemoglobin 12.0 - 15.0 g/dL 11.3(L) 12.7 9.2(L)  Hematocrit 36.0 - 46.0 % 34.7(L) 38.6 28.6(L)  Platelets 150.0 - 400.0 K/uL 311.0 372 239    Lab Results  Component Value Date/Time   VD25OH 34 11/20/2013 09:49 AM   VD25OH 50 10/23/2011 02:14 PM    Clinical  ASCVD:  The ASCVD Risk score (Arnett DK, et al., 2019) failed to calculate for the following reasons:   The 2019 ASCVD risk score is only valid for ages 49 to 49    Social History   Tobacco Use  Smoking Status Never  Smokeless Tobacco Never   BP Readings from Last 3 Encounters:  10/16/20 137/68  09/23/20 (!) 168/89  09/23/20 (!) 165/73   Pulse Readings from Last 3 Encounters:  10/16/20 79  09/23/20 95  09/23/20 (!) 103   Wt Readings from Last 3 Encounters:  10/16/20 178 lb 9.6 oz (81 kg)  09/23/20 180 lb (81.6 kg)  09/12/20 184 lb 8 oz (83.7 kg)    Assessment: Review of patient past medical history,  allergies, medications, health status, including review of consultants reports, laboratory and other test data, was performed as part of comprehensive evaluation and provision of chronic care management services.   SDOH:  (Social Determinants of Health) assessments and interventions performed:    CCM Care Plan  No Known Allergies  Medications Reviewed Today     Reviewed by [name], [facility] (Pharmacist) on 11/12/20 at 1139  Med List Status: <None>   Medication Order Taking? Sig Documenting Provider Last Dose Status Informant  ALPRAZolam (XANAX) 0.25 MG tablet [id number] Yes TAKE 1 TABLET(0.25 MG) BY MOUTH AT BEDTIME AS NEEDED FOR ANXIETY Maximiano Coss, NP Taking Active   amLODipine (NORVASC) 5 MG tablet [id number] Yes TAKE 1 TABLET(5 MG) BY MOUTH DAILY Maximiano Coss, NP Taking Active   aspirin 81 MG chewable tablet [id number] No Chew 1 tablet (81 mg total) by mouth 2 (two) times daily.  Patient not taking: Reported on 11/12/2020   Dorothyann Peng, [place] Not Taking Active   atorvastatin (LIPITOR) 10 MG tablet [id number] No Take 1 tablet (10 mg total) by mouth daily.  Patient not taking: Reported on 11/12/2020   Jacelyn Pi, Lilia Argue, MD Not Taking Active Self  azelastine (ASTELIN) 0.1 % nasal spray [id number]  Place 1 spray into both nostrils 2 (two) times daily. Use in each nostril as directed Maximiano Coss, NP  Active Self  Carboxymethylcellul-Glycerin (REFRESH OPTIVE) 1-0.9 % GEL [id number]  Place 1 drop into both eyes daily. [provider]  Active Self  donepezil (ARICEPT) 5 MG tablet [id number] Yes TAKE 1 TABLET(5 MG) BY MOUTH AT BEDTIME Dohmeier, Asencion Partridge, MD Taking Active   furosemide (LASIX) 40 MG tablet [id number] No Take 1 tablet (40 mg total) by mouth daily.  Patient not taking: Reported on 11/12/2020   Maximiano Coss, NP Not Taking Active Self  gabapentin (NEURONTIN) 600 MG tablet [id number] No TAKE 1 TABLET(600 MG) BY MOUTH THREE TIMES DAILY  Patient not taking: Reported  on 11/12/2020   Jacelyn Pi, Lilia Argue, MD Not Taking Active Self  HYDROcodone-acetaminophen (NORCO/VICODIN) 5-325 MG tablet [id number] Yes Take 1 tablet by mouth every 6 (six) hours as needed for moderate pain. For chronic pain Maximiano Coss, NP Taking Active   hydrOXYzine (ATARAX/VISTARIL) 10 MG tablet [id number] Yes TAKE 1 TABLET(10 MG) BY MOUTH THREE TIMES DAILY AS NEEDED Maximiano Coss, NP Taking Active   lisinopril (ZESTRIL) 40 MG tablet [id number] Yes TAKE 1 TABLET(40 MG) BY MOUTH DAILY Maximiano Coss, NP Taking Active   meclizine (ANTIVERT) 50 MG tablet [id number]  Take 1 tablet (50 mg total) by mouth 3 (three) times daily as needed. Violet Baldy, MD  Active   metFORMIN (GLUCOPHAGE) 500 MG tablet [id number] Yes TAKE 1 TABLET(500 MG) BY MOUTH TWICE DAILY WITH A MEAL  Maximiano Coss, NP Taking Active   omeprazole (PRILOSEC) 40 MG capsule [id number] Yes TAKE 1 CAPSULE(40 MG) BY MOUTH DAILY Maximiano Coss, NP Taking Active Self  traZODone (DESYREL) 50 MG tablet [id number] Yes Take 1 tablet (50 mg total) by mouth at bedtime. Must keep upcoming appt (09/12/20) for future refills Dohmeier, Asencion Partridge, MD Taking Active             Patient Active Problem List   Diagnosis Date Noted   Osteoarthritis of right knee 07/24/2020   Cognitive deficit with impaired visuospatial function 01/03/2020   Memory impairment of gradual onset 01/03/2020   Anxiety about health 01/03/2020   Dementia without behavioral disturbance ([place]) 01/03/2020   Gastroesophageal reflux disease without esophagitis 07/17/2019   Preoperative cardiovascular examination 05/13/2018   Vitamin B12 deficiency 07/14/2017   Primary osteoarthritis of left knee 07/07/2013   Type 2 diabetes mellitus with microalbuminuria, without long-term current use of insulin ([place]) 05/26/2012   Anemia 10/23/2011   Encounter for medication monitoring 10/23/2011   Hyponatremia 12/01/2010    Class: Acute   Chest pain with moderate risk for cardiac  etiology 11/26/2010    Class: Acute   CYSTOCELE WITH INCOMPLETE UTERINE PROLAPSE 03/10/2010   HYPERGLYCEMIA 02/06/2010   ESOPHAGEAL STRICTURE 12/11/2009   Obesity 12/09/2009   PROBLEMS WITH SWALLOWING AND MASTICATION 12/09/2009   MICROSCOPIC HEMATURIA 07/29/2009   ARM PAIN, RIGHT 03/28/2009   Fatigue 03/28/2009   CONSTIPATION 09/25/2008   Sinusitis, chronic 02/23/2008   Osteoarthrosis involving lower leg 09/28/2007   LEG EDEMA, BILATERAL 08/31/2007   FLANK PAIN, LEFT 06/08/2007   SPRAIN&STRAIN OTH SPEC SITES SHOULDER&UPPER ARM 06/08/2007   BENIGN POSITIONAL VERTIGO 04/25/2007   Headache 03/24/2007   HYPERCHOLESTEROLEMIA 03/03/2007   GAD (generalized anxiety disorder) 03/03/2007   HYPERTENSION, BENIGN ESSENTIAL 03/03/2007   Allergic rhinitis 03/03/2007   HIP PAIN, LEFT 03/03/2007   Osteoporosis 03/03/2007    Immunization History  Administered Date(s) Administered   Fluad Quad(high Dose 65+) 10/16/2019, 10/16/2020   Influenza Split 10/20/2013   Influenza Whole 01/10/2008, 12/28/2008, 12/02/2009   Influenza, High Dose Seasonal PF 10/16/2018   Influenza, Seasonal, Injecte, Preservative Fre 11/23/2012   Influenza,inj,Quad PF,6+ Mos 12/24/2015, 09/16/2016   Influenza-Unspecified 10/09/2011, 10/19/2014, 09/23/2017   PFIZER(Purple Top)SARS-COV-2 Vaccination 03/14/2019, 04/04/2019, 12/25/2019   Pneumococcal Conjugate-13 10/20/2013   Pneumococcal Polysaccharide-23 03/24/2007   Td 09/28/2007   Zoster Recombinat (Shingrix) 01/21/2018    Conditions to be addressed/monitored: GAD, HTN, Type II DM, GERD   Care Plan : General Pharmacy (Adult)  Updates made by [name], [place] since 11/13/2020 12:00 AM     Problem: HLD DMII GAD HTN Obesity   Priority: High     Long-Range Goal: Disease Management   Start Date: 11/13/2020  This Visit's Progress: On track  Priority: High  Note:   Hypertension (BP goal <140/90) -Not ideally controlled -Current treatment: Lisinopril 40 mg once  daily Amlodipine 5 mg once daily -Medications previously tried: n/a  -Current home readings: not testing -Current dietary habits: Fruit, greens. Mainly chicken for meat.  -Current exercise habits: limited due to vertigo/knee pain but does best to walk. Has had issues with dizziness, fatigue. Has f/u scheduled w. PCP. No currently using the aspirin.  -Denies hypotensive/hypertensive symptoms -Educated on BP goals and benefits of medications for prevention of heart attack, stroke and kidney damage; -Counseled to monitor BP at home as directed, document, and provide log at future appointments -Recommended to continue current medication  Hyperlipidemia: (LDL goal < 100) -Not ideally controlled -Current treatment:  Atorvastatin 10 mg once daily -No problems with tolerability noted.   -Educated on Cholesterol goals;  Benefits of statin for ASCVD risk reduction; -Counseled on diet and exercise extensively  Diabetes (A1c goal <8% due to age/complexity) -Controlled due to age  -Current medications: Metformin 500 MG twice daily  -Current home glucose readings fasting glucose: none provided post prandial glucose: none provided -Denies hypoglycemic/hyperglycemic symptoms -Educated on A1c and blood sugar goals; -Counseled to check feet daily and get yearly eye exams -Counseled on diet and exercise extensively Recommended to continue current medication  Bone Health (goal to ensure appropriate supplementation) -Controlled -Last DEXA 2020 - normal BMD -Current tx - none Recommend Vitamin D3 1000 units daily   Medication Assistance: None required.  Patient affirms current coverage meets needs.  Patient's preferred pharmacy is:  [facility] [phone] - [place], [place] - [address] [place] [address] Phone: [phone] Fax: [phone]  [facility] [phone] - [place], [place] - [address] AT [address] [place] [address] Phone: [phone] Fax: [phone]  Uses pill box? Yes Pt endorses 100% compliance  Follow Up:  Patient agrees to Care Plan and Follow-up.  Plan: 1 month DM call, 6 month f/u w/ cpp    Future Appointments  Date Time Provider [employer]  11/14/2020  9:20 AM [facility] MM 2 [facility]  11/21/2020 10:50 AM Maximiano Coss, NP [facility]  03/10/2021  3:00 PM Ward Givens, NP [facility] None  04/15/2021  9:10 AM Maximiano Coss, NP [facility]  04/30/2021  9:15 AM [facility] PHARMACIST [facility]   [name], PharmD, CPP Clinical Pharmacist Practitioner  [facility]  [phone]

## 2020-11-12 NOTE — Patient Instructions (Addendum)
Casey [name],  Thank you for talking with me today. I have included our care plan/goals in the following pages.   Please review and call me at [phone] with any questions.  Thanks! Casey [name], PharmD, CPP Clinical Pharmacist Practitioner  [phone]  Care Plan : General Pharmacy (Adult)  Updates made by [name], [facility] since 11/13/2020 12:00 AM     Problem: HLD DMII GAD HTN Obesity   Priority: High     Long-Range Goal: Disease Management   Start Date: 11/13/2020  This Visit's Progress: On track  Priority: High  Note:   Hypertension (BP goal <140/90) -Not ideally controlled -Current treatment: Lisinopril 40 mg once daily Amlodipine 5 mg once daily -Medications previously tried: n/a  -Current home readings: not testing -Current dietary habits: Fruit, greens. Mainly chicken for meat.  -Current exercise habits: limited due to vertigo/knee pain but does best to walk. Has had issues with dizziness, fatigue. Has f/u scheduled w. PCP. No currently using the aspirin.  -Denies hypotensive/hypertensive symptoms -Educated on BP goals and benefits of medications for prevention of heart attack, stroke and kidney damage; -Counseled to monitor BP at home as directed, document, and provide log at future appointments -Recommended to continue current medication  Hyperlipidemia: (LDL goal < 100) -Not ideally controlled -Current treatment: Atorvastatin 10 mg once daily -No problems with tolerability noted.   -Educated on Cholesterol goals;  Benefits of statin for ASCVD risk reduction; -Counseled on diet and exercise extensively  Diabetes (A1c goal <8% due to age/complexity) -Controlled due to age  -Current medications: Metformin 500 MG twice daily  -Current home glucose readings fasting glucose: none provided post prandial glucose: none provided -Denies hypoglycemic/hyperglycemic symptoms -Educated on A1c and blood sugar goals; -Counseled to check feet daily  and get yearly eye exams -Counseled on diet and exercise extensively Recommended to continue current medication  Bone Health (goal to ensure appropriate supplementation) -Controlled -Last DEXA 2020 - normal BMD -Current tx - none Recommend Vitamin D3 1000 units daily   Medication Assistance: None required.  Patient affirms current coverage meets needs.  Patient's preferred pharmacy is:  [facility] [phone] - [place], [place] - [address] [place] [address] Phone: [phone] Fax: [phone]  [facility] [phone] - [place], [place] - [address] [place] [address] Phone: [phone] Fax: [phone]  Uses pill box? Yes Pt endorses 100% compliance  Follow Up:  Patient agrees to Care Plan and Follow-up.  Plan: 1 month DM call, 6 month f/u w/ cpp     The patient was given the following information about Chronic Care Management services today, agreed to services, and gave verbal consent: 1. CCM service includes personalized support from designated clinical staff supervised by the primary care provider, including individualized plan of care and coordination with other care providers 2. 24/7 contact phone numbers for assistance for urgent and routine care needs. 3. Service will only be billed when office clinical staff spend 20 minutes or more in a month to coordinate care. 4. Only one practitioner may furnish and bill the service in a calendar month. 5.The patient may stop CCM services at any time (effective at the end of the month) by phone call to the office staff. 6. The patient will be responsible for cost sharing (co-pay) of up to 20% of the service fee (after annual deductible is met). Patient agreed to  services and consent obtained.  The patient verbalized understanding of instructions provided today and agreed to receive a  MyChart copy of patient instruction and/or educational materials. Telephone follow up appointment with pharmacy team member scheduled for: See next appointment with "Care Management Staff" under "What's Next" below.

## 2020-11-14 ENCOUNTER — Other Ambulatory Visit: Payer: Self-pay

## 2020-11-14 ENCOUNTER — Ambulatory Visit
Admission: RE | Admit: 2020-11-14 | Discharge: 2020-11-14 | Disposition: A | Discharge status: Home / Self Care | Payer: Medicare Other | Source: Ambulatory Visit | Attending: Registered Nurse | Admitting: Registered Nurse

## 2020-11-14 ENCOUNTER — Other Ambulatory Visit: Payer: Self-pay | Admitting: Neurology

## 2020-11-14 DIAGNOSIS — Z1231 Encounter for screening mammogram for malignant neoplasm of breast: Secondary | ICD-10-CM

## 2020-11-18 DIAGNOSIS — R809 Proteinuria, unspecified: Secondary | ICD-10-CM

## 2020-11-18 DIAGNOSIS — I10 Essential (primary) hypertension: Secondary | ICD-10-CM | POA: Diagnosis not present

## 2020-11-18 DIAGNOSIS — E1129 Type 2 diabetes mellitus with other diabetic kidney complication: Secondary | ICD-10-CM

## 2020-11-18 DIAGNOSIS — E7800 Pure hypercholesterolemia, unspecified: Secondary | ICD-10-CM | POA: Diagnosis not present

## 2020-11-21 ENCOUNTER — Other Ambulatory Visit: Payer: Self-pay

## 2020-11-21 ENCOUNTER — Ambulatory Visit (INDEPENDENT_AMBULATORY_CARE_PROVIDER_SITE_OTHER): Payer: Medicare Other | Admitting: Registered Nurse

## 2020-11-21 ENCOUNTER — Encounter: Payer: Self-pay | Admitting: Registered Nurse

## 2020-11-21 VITALS — BP 187/79 | HR 102 | Temp 98.1°F | Resp 18 | Ht 62.0 in | Wt 173.4 lb

## 2020-11-21 DIAGNOSIS — F411 Generalized anxiety disorder: Secondary | ICD-10-CM | POA: Diagnosis not present

## 2020-11-21 DIAGNOSIS — R710 Precipitous drop in hematocrit: Secondary | ICD-10-CM | POA: Diagnosis not present

## 2020-11-21 DIAGNOSIS — R252 Cramp and spasm: Secondary | ICD-10-CM

## 2020-11-21 LAB — IBC + FERRITIN
Ferritin: 13.2 ng/mL (ref 10.0–291.0)
Iron: 51 ug/dL (ref 42–145)
Saturation Ratios: 10.7 % — ABNORMAL LOW (ref 20.0–50.0)
TIBC: 476 ug/dL — ABNORMAL HIGH (ref 250.0–450.0)
Transferrin: 340 mg/dL (ref 212.0–360.0)

## 2020-11-21 LAB — CBC WITH DIFFERENTIAL/PLATELET
Basophils Absolute: 0 10*3/uL (ref 0.0–0.1)
Basophils Relative: 0.4 % (ref 0.0–3.0)
Eosinophils Absolute: 0 10*3/uL (ref 0.0–0.7)
Eosinophils Relative: 0.5 % (ref 0.0–5.0)
HCT: 37.7 % (ref 36.0–46.0)
Hemoglobin: 12.3 g/dL (ref 12.0–15.0)
Lymphocytes Relative: 35.8 % (ref 12.0–46.0)
Lymphs Abs: 3.1 10*3/uL (ref 0.7–4.0)
MCHC: 32.7 g/dL (ref 30.0–36.0)
MCV: 81.8 fl (ref 78.0–100.0)
Monocytes Absolute: 0.6 10*3/uL (ref 0.1–1.0)
Monocytes Relative: 6.6 % (ref 3.0–12.0)
Neutro Abs: 4.9 10*3/uL (ref 1.4–7.7)
Neutrophils Relative %: 56.7 % (ref 43.0–77.0)
Platelets: 347 10*3/uL (ref 150.0–400.0)
RBC: 4.61 Mil/uL (ref 3.87–5.11)
RDW: 16.3 % — ABNORMAL HIGH (ref 11.5–15.5)
WBC: 8.7 10*3/uL (ref 4.0–10.5)

## 2020-11-21 LAB — MAGNESIUM: Magnesium: 1.4 mg/dL — ABNORMAL LOW (ref 1.5–2.5)

## 2020-11-21 MED ORDER — BUPROPION HCL ER (XL) 150 MG PO TB24: 150.0000 mg | ORAL_TABLET | Freq: Every day | ORAL | 1 refills | Status: DC

## 2020-11-21 MED ORDER — TRAZODONE HCL 100 MG PO TABS
100.0000 mg | ORAL_TABLET | Freq: Every day | ORAL | 0 refills | Status: DC
Start: 2020-11-21 — End: 2021-02-21

## 2020-11-21 NOTE — Patient Instructions (Addendum)
[name] -   [name] to see you!   Let's check on labs to ensure there are no ongoing concerns or changes.  I will call with results.   Start Bupropion (Wellbutrin) 150mg  daily. Take this in the mornings.  Increase trazodone to 100mg  at night. Take 2 of the 50mg  tablets until you run out  We will set up an MRI of the brain for you. You will get a call to schedule  I'll be in touch soon  Thank you  Rich     If you have lab work done today you will be contacted with your lab results within the next 2 weeks.  If you have not heard from then please contact . The fastest way to get your results is to register for My Chart.   IF you received an x-ray today, you will receive an invoice from [facility]. Please contact [facility] at [phone] with questions or concerns regarding your invoice.   IF you received labwork today, you will receive an invoice from [facility]. Please contact [facility] at [phone] with questions or concerns regarding your invoice.   Our billing staff will not be able to assist you with questions regarding bills from these companies.  You will be contacted with the lab results as soon as they are available. The fastest way to get your results is to activate your My Chart account. Instructions are located on the last page of this paperwork. If you have not heard from [phone] regarding the results in 2 weeks, please contact this office.

## 2020-11-21 NOTE — Progress Notes (Signed)
Established Patient Office Visit  Subjective:  Patient ID: Casey [name], female    DOB: Dec 09, 1935  Age: 85 y.o. MRN: [id number]  CC:  Chief Complaint  Patient presents with   Follow-up    Patient is here to follow up on lab work .patient states she thinks she has some Vertigo she has been dizzy like this before in the hospital.    HPI Casey [name] presents for follow up   Seen around 1 mo ago in office, noted to have drop in blood counts -  CBC EXTENDED Latest Ref Rng & Units 10/16/2020 09/23/2020 07/26/2020  WBC 4.0 - 10.5 K/uL 5.5 9.7 11.9(H)  RBC 3.87 - 5.11 Mil/uL 4.22 4.68 3.31(L)  HGB 12.0 - 15.0 g/dL 11.3(L) 12.7 9.2(L)  HCT 36.0 - 46.0 % 34.7(L) 38.6 28.6(L)  PLT 150.0 - 400.0 K/uL 311.0 372 239  NEUTROABS 1.4 - 7.7 K/uL 2.0 4.6 -  LYMPHSABS 0.7 - 4.0 K/uL 2.8 4.2(H) -   Feeling well overall, but having some dizziness. Worse in morning. Resolves after a few moments. No falls or LOC. Dizziness feels very similar to BPPV that she had in early September. The neuro workup was largely negative.   Dr. Brett Fairy had ordered MRI brain but pt was not able to do - states she did not get a call - unsure of what happened here  Still poor sleep. Takes alprazolam and trazodone 50mg  nightly. We are continuing to work to convince her to stop the alprazolam but she has severe anxiety and responds very poorly to the idea of stopping the alprazolam.  Past Medical History:  Diagnosis Date   ALLERGIC RHINITIS    Allergy    ANXIETY    ARM PAIN, RIGHT    BENIGN POSITIONAL VERTIGO    Carcinoma in situ of colon    CONSTIPATION    CYSTOCELE WITH INCOMPLETE UTERINE PROLAPSE    Diabetes mellitus without complication ([place])    ESOPHAGEAL STRICTURE    FATIGUE    FLANK PAIN, LEFT    Headache(784.0)    HIP PAIN, LEFT    HYPERCHOLESTEROLEMIA    HYPERGLYCEMIA    HYPERTENSION, BENIGN ESSENTIAL 03/03/2007   LEG EDEMA, BILATERAL    Microscopic hematuria    OBESITY    OSTEOARTHRITIS, KNEE     OSTEOPOROSIS    Peripheral neuropathy, idiopathic    Problems with swallowing and mastication    SINUSITIS, RECURRENT    SPRAIN&STRAIN OTH SPEC SITES SHOULDER&UPPER ARM     Past Surgical History:  Procedure Laterality Date   COLONOSCOPY     KNEE ARTHROPLASTY Right 07/24/2020   Procedure: COMPUTER ASSISTED TOTAL KNEE ARTHROPLASTY;  Surgeon: Rod Can, MD;  Location: [facility];  Service: Orthopedics;  Laterality: Right;   LEFT HEART CATHETERIZATION WITH CORONARY ANGIOGRAM N/A 12/02/2010   Procedure: LEFT HEART CATHETERIZATION WITH CORONARY ANGIOGRAM;  Surgeon: Peter M Martinique, MD;  Location: [facility] CATH LAB;  Service: Cardiovascular;:: NORMAL CORONARY ARTERIES - NORMAL LV FUNCTION & EDP.     TRANSTHORACIC ECHOCARDIOGRAM  11/2010   Normal EF 60-65%. No RWMA.  Mild LAD dilation c/w Gr 1 DD.  -- PRETTY NORMAL ECHO    Family History  Problem Relation Age of Onset   Coronary artery disease Mother 35   Cancer Father 83   Heart disease Father    Colon cancer Other    Diabetes Other    Coronary artery disease Sister    Diabetes type II Sister    Cancer Sister  Coronary artery disease Brother    Kidney disease Brother    Coronary artery disease Brother    Breast cancer Neg Hx     Social History   Socioeconomic History   Marital status: Single    Spouse name: Not on file   Number of children: 1   Years of education: 12   Highest education level: High school graduate  Occupational History   Not on file  Tobacco Use   Smoking status: Never   Smokeless tobacco: Never  Vaping Use   Vaping Use: Never used  Substance and Sexual Activity   Alcohol use: No   Drug use: No   Sexual activity: Not Currently  Other Topics Concern   Not on file  Social History Narrative   She tries to stay active, but over the last year (20 19-20 20), she has been very limited by her right knee pain.   Is able to go grocery shopping.   Social Determinants of Health   Financial Resource Strain: Not on  file  Food Insecurity: Not on file  Transportation Needs: Not on file  Physical Activity: Not on file  Stress: Not on file  Social Connections: Not on file  Intimate Partner Violence: Not on file    Outpatient Medications Prior to Visit  Medication Sig Dispense Refill   ALPRAZolam (XANAX) 0.25 MG tablet TAKE 1 TABLET(0.25 MG) BY MOUTH AT BEDTIME AS NEEDED FOR ANXIETY 30 tablet 3   amLODipine (NORVASC) 5 MG tablet TAKE 1 TABLET(5 MG) BY MOUTH DAILY 90 tablet 0   atorvastatin (LIPITOR) 10 MG tablet Take 1 tablet (10 mg total) by mouth daily. 90 tablet 3   azelastine (ASTELIN) 0.1 % nasal spray Place 1 spray into both nostrils 2 (two) times daily. Use in each nostril as directed 30 mL 12   Carboxymethylcellul-Glycerin (REFRESH OPTIVE) 1-0.9 % GEL Place 1 drop into both eyes daily.     donepezil (ARICEPT) 5 MG tablet TAKE 1 TABLET(5 MG) BY MOUTH AT BEDTIME 90 tablet 0   gabapentin (NEURONTIN) 600 MG tablet TAKE 1 TABLET(600 MG) BY MOUTH THREE TIMES DAILY 270 tablet 2   HYDROcodone-acetaminophen (NORCO/VICODIN) 5-325 MG tablet Take 1 tablet by mouth every 6 (six) hours as needed for moderate pain. For chronic pain 90 tablet 0   hydrOXYzine (ATARAX/VISTARIL) 10 MG tablet TAKE 1 TABLET(10 MG) BY MOUTH THREE TIMES DAILY AS NEEDED 90 tablet 1   lisinopril (ZESTRIL) 40 MG tablet TAKE 1 TABLET(40 MG) BY MOUTH DAILY 90 tablet 0   meclizine (ANTIVERT) 50 MG tablet Take 1 tablet (50 mg total) by mouth 3 (three) times daily as needed. 30 tablet 0   metFORMIN (GLUCOPHAGE) 500 MG tablet TAKE 1 TABLET(500 MG) BY MOUTH TWICE DAILY WITH A MEAL 180 tablet 1   omeprazole (PRILOSEC) 40 MG capsule TAKE 1 CAPSULE(40 MG) BY MOUTH DAILY 30 capsule 5   traZODone (DESYREL) 50 MG tablet TAKE 1 TABLET(50 MG) BY MOUTH AT BEDTIME 30 tablet 1   aspirin 81 MG chewable tablet Chew 1 tablet (81 mg total) by mouth 2 (two) times daily. (Patient not taking: No sig reported)     furosemide (LASIX) 40 MG tablet Take 1 tablet (40 mg  total) by mouth daily. (Patient not taking: No sig reported) 90 tablet 1   No facility-administered medications prior to visit.    Not on File  ROS Review of Systems  Constitutional: Negative.   HENT: Negative.    Eyes: Negative.   Respiratory: Negative.  Cardiovascular: Negative.   Gastrointestinal: Negative.   Genitourinary: Negative.   Musculoskeletal: Negative.   Skin: Negative.   Neurological: Negative.   Psychiatric/Behavioral: Negative.    All other systems reviewed and are negative.    Objective:    Physical Exam Vitals and nursing note reviewed.  Constitutional:      General: She is not in acute distress.    Appearance: Normal appearance. She is normal weight. She is not ill-appearing, toxic-appearing or diaphoretic.  Cardiovascular:     Rate and Rhythm: Normal rate and regular rhythm.     Heart sounds: Normal heart sounds. No murmur heard.   No friction rub. No gallop.  Pulmonary:     Effort: Pulmonary effort is normal. No respiratory distress.     Breath sounds: Normal breath sounds. No stridor. No wheezing, rhonchi or rales.  Chest:     Chest wall: No tenderness.  Skin:    General: Skin is warm and dry.  Neurological:     General: No focal deficit present.     Mental Status: She is alert and oriented to person, place, and time. Mental status is at baseline.  Psychiatric:        Mood and Affect: Mood normal.        Behavior: Behavior normal.        Thought Content: Thought content normal.        Judgment: Judgment normal.    BP (!) 187/79   Pulse (!) 102   Temp 98.1 F (36.7 C) (Temporal)   Resp 18   Ht 5\' 2"  (1.575 m)   Wt 173 lb 6.4 oz (78.7 kg)   SpO2 99%   BMI 31.72 kg/m  Wt Readings from Last 3 Encounters:  11/21/20 173 lb 6.4 oz (78.7 kg)  10/16/20 178 lb 9.6 oz (81 kg)  09/23/20 180 lb (81.6 kg)     Health Maintenance Due  Topic Date Due   FOOT EXAM  10/20/2019   OPHTHALMOLOGY EXAM  11/24/2019   COVID-19 Vaccine (4 - Booster  for Pfizer series) 02/19/2020    There are no preventive care reminders to display for this patient.  Lab Results  Component Value Date   TSH 1.544 09/23/2020   Lab Results  Component Value Date   WBC 5.5 10/16/2020   HGB 11.3 (L) 10/16/2020   HCT 34.7 (L) 10/16/2020   MCV 82.1 10/16/2020   PLT 311.0 10/16/2020   Lab Results  Component Value Date   NA 139 10/16/2020   K 3.4 (L) 10/16/2020   CO2 29 10/16/2020   GLUCOSE 128 (H) 10/16/2020   BUN 9 10/16/2020   CREATININE 0.78 10/16/2020   BILITOT 0.4 10/16/2020   ALKPHOS 73 10/16/2020   AST 17 10/16/2020   ALT 13 10/16/2020   PROT 7.4 10/16/2020   ALBUMIN 4.2 10/16/2020   CALCIUM 9.8 10/16/2020   ANIONGAP 13 09/23/2020   GFR 70.11 10/16/2020   Lab Results  Component Value Date   CHOL 252 (H) 10/16/2020   Lab Results  Component Value Date   HDL 77.70 10/16/2020   Lab Results  Component Value Date   LDLCALC 152 (H) 10/16/2020   Lab Results  Component Value Date   TRIG 113.0 10/16/2020   Lab Results  Component Value Date   CHOLHDL 3 10/16/2020   Lab Results  Component Value Date   HGBA1C 7.4 (H) 10/16/2020      Assessment & Plan:   Problem List Items Addressed This Visit  Other   GAD (generalized anxiety disorder)   Relevant Medications   buPROPion (WELLBUTRIN XL) 150 MG 24 hr tablet   traZODone (DESYREL) 100 MG tablet   Other Visit Diagnoses     Drop in hemoglobin    -  Primary   Relevant Orders   CBC with Differential/Platelet   Iron, TIBC and Ferritin Panel   Pathologist smear review   IBC + Ferritin   Leg cramping       Relevant Orders   Magnesium       Meds ordered this encounter  Medications   buPROPion (WELLBUTRIN XL) 150 MG 24 hr tablet    Sig: Take 1 tablet (150 mg total) by mouth daily.    Dispense:  90 tablet    Refill:  1    Order Specific Question:   Supervising Provider    Answer:   Carlota Raspberry, JEFFREY R [[id number]]   traZODone (DESYREL) 100 MG tablet    Sig: Take 1  tablet (100 mg total) by mouth at bedtime.    Dispense:  90 tablet    Refill:  0    Order Specific Question:   Supervising Provider    Answer:   Carlota Raspberry, JEFFREY R [id number]    Follow-up: Return in about 3 months (around 02/21/2021) for med check, labs.   PLAN Start bupropion 150mg  XL po qd. Med check in 8 weeks. Labs collected. Will follow up with the patient as warranted. Will order MRI brain - will have Brooke CMA follow up on this to ensure scheduling and completion. Increase trazodone to 100mg  po qhs Patient encouraged to call clinic with any questions, comments, or concerns.  Maximiano Coss, NP

## 2020-11-22 LAB — PATHOLOGIST SMEAR REVIEW

## 2020-11-22 NOTE — Progress Notes (Signed)
Pt should start iron supplement daily as well. Can send if she'd like, or OTC is ok, including OTC multivitamin with iron

## 2020-11-22 NOTE — Progress Notes (Signed)
Can call patient - overall no major concerns on labs. She can take a low dose OTC magnesium supplement before bed each night to help with cramping in her legs. Blood counts are stable. Awaiting a smear review but I anticipate this will be normal.  Thanks,  Rich

## 2020-11-29 ENCOUNTER — Other Ambulatory Visit: Payer: Self-pay | Admitting: Registered Nurse

## 2020-11-29 DIAGNOSIS — I10 Essential (primary) hypertension: Secondary | ICD-10-CM

## 2020-12-01 ENCOUNTER — Other Ambulatory Visit: Payer: Self-pay | Admitting: Registered Nurse

## 2020-12-01 DIAGNOSIS — I10 Essential (primary) hypertension: Secondary | ICD-10-CM

## 2020-12-02 ENCOUNTER — Telehealth: Payer: Self-pay

## 2020-12-02 ENCOUNTER — Other Ambulatory Visit: Payer: Self-pay | Admitting: Registered Nurse

## 2020-12-02 DIAGNOSIS — H8110 Benign paroxysmal vertigo, unspecified ear: Secondary | ICD-10-CM

## 2020-12-02 DIAGNOSIS — R42 Dizziness and giddiness: Secondary | ICD-10-CM

## 2020-12-02 DIAGNOSIS — R413 Other amnesia: Secondary | ICD-10-CM

## 2020-12-02 NOTE — Telephone Encounter (Signed)
Patient states she is calling because she is suppose to have a MRI scheduled or a referral but has not heard anything.

## 2020-12-02 NOTE — Telephone Encounter (Signed)
Called and spoke with patient and she understood , and will be waiting on that call.

## 2020-12-20 ENCOUNTER — Telehealth: Payer: Self-pay | Admitting: Registered Nurse

## 2020-12-20 NOTE — Telephone Encounter (Signed)
..  Caller name:  Heaven Meeker  Caller callback [phone]  Encourage patient to contact the pharmacy for refills or they can request refills through [facility]  (Please schedule appointment if patient has not been seen in over a year)  MEDICATION NAME & DOSE:hydrocodone   Notes/Comments from patient:Patient states she out of medication  WHAT PHARMACY WOULD THEY LIKE THIS SENT TO: [facility] on [address], [place]   Please notify patient: It takes 48-72 hours to process rx refill requests Ask patient to call pharmacy to ensure rx is ready before heading there.   (CLINICAL TO FILL OR ROUTE PER PROTOCOLS)

## 2020-12-23 ENCOUNTER — Other Ambulatory Visit: Payer: Self-pay | Admitting: Registered Nurse

## 2020-12-23 DIAGNOSIS — M170 Bilateral primary osteoarthritis of knee: Secondary | ICD-10-CM

## 2020-12-23 MED ORDER — HYDROCODONE-ACETAMINOPHEN 5-325 MG PO TABS: 1.0000 | ORAL_TABLET | Freq: Four times a day (QID) | ORAL | 0 refills | Status: DC | PRN

## 2020-12-23 NOTE — Telephone Encounter (Signed)
Sent Thanks Rich

## 2020-12-27 ENCOUNTER — Other Ambulatory Visit: Payer: Self-pay

## 2020-12-27 ENCOUNTER — Ambulatory Visit
Admission: RE | Admit: 2020-12-27 | Discharge: 2020-12-27 | Disposition: A | Discharge status: Home / Self Care | Payer: Medicare Other | Source: Ambulatory Visit | Attending: Registered Nurse | Admitting: Registered Nurse

## 2020-12-27 DIAGNOSIS — H8110 Benign paroxysmal vertigo, unspecified ear: Secondary | ICD-10-CM

## 2020-12-27 DIAGNOSIS — R413 Other amnesia: Secondary | ICD-10-CM

## 2020-12-27 DIAGNOSIS — R42 Dizziness and giddiness: Secondary | ICD-10-CM

## 2020-12-27 MED ORDER — GADOBENATE DIMEGLUMINE 529 MG/ML IV SOLN
15.0000 mL | Freq: Once | INTRAVENOUS | Status: AC | PRN
  Administered 2020-12-27: 15 mL via INTRAVENOUS

## 2020-12-30 ENCOUNTER — Telehealth: Payer: Self-pay

## 2020-12-30 NOTE — Telephone Encounter (Signed)
Patient returned my call. I discussed her MRI results. Patient reports that she has been in touch with her PCP about dizziness. I encouraged her to continue following up with her PCP and let us know if they need [facility]'s input. Pt verbalized understanding of results. Pt had no questions at this time but was encouraged to call back if questions arise.

## 2020-12-30 NOTE — Telephone Encounter (Signed)
I called patient to discuss. No answer, left a message asking her to call us back. Please route to POD 1 if patient calls back.

## 2020-12-30 NOTE — Telephone Encounter (Signed)
-----   Message from Melvyn Novas, MD sent at 12/30/2020  8:39 AM EST ----- Normal MRI brain for age.

## 2020-12-31 ENCOUNTER — Other Ambulatory Visit: Payer: Self-pay | Admitting: Registered Nurse

## 2020-12-31 ENCOUNTER — Telehealth: Payer: Medicare Other | Admitting: Registered Nurse

## 2020-12-31 DIAGNOSIS — K219 Gastro-esophageal reflux disease without esophagitis: Secondary | ICD-10-CM

## 2021-01-01 ENCOUNTER — Other Ambulatory Visit: Payer: Self-pay | Admitting: Registered Nurse

## 2021-01-01 DIAGNOSIS — I10 Essential (primary) hypertension: Secondary | ICD-10-CM

## 2021-01-30 ENCOUNTER — Other Ambulatory Visit: Payer: Self-pay | Admitting: Registered Nurse

## 2021-01-30 DIAGNOSIS — I10 Essential (primary) hypertension: Secondary | ICD-10-CM

## 2021-01-31 ENCOUNTER — Other Ambulatory Visit: Payer: Self-pay | Admitting: Registered Nurse

## 2021-01-31 DIAGNOSIS — F411 Generalized anxiety disorder: Secondary | ICD-10-CM

## 2021-01-31 NOTE — Telephone Encounter (Signed)
Patient is requesting a refill of the following medications: Requested Prescriptions   Pending Prescriptions Disp Refills   ALPRAZolam (XANAX) 0.25 MG tablet [Pharmacy Med Name: ALPRAZOLAM 0.25MG  TABLETS] 30 tablet     Sig: TAKE 1 TABLET(0.25 MG) BY MOUTH AT BEDTIME AS NEEDED FOR ANXIETY    Date of patient request: 01/31/21 Last office visit: 11/21/2020 Date of last refill: 10/04/2020 Last refill amount: 30 tab x3 refill

## 2021-02-03 ENCOUNTER — Telehealth: Payer: Self-pay | Admitting: Registered Nurse

## 2021-02-03 ENCOUNTER — Other Ambulatory Visit: Payer: Self-pay | Admitting: Registered Nurse

## 2021-02-03 DIAGNOSIS — M170 Bilateral primary osteoarthritis of knee: Secondary | ICD-10-CM

## 2021-02-03 MED ORDER — HYDROCODONE-ACETAMINOPHEN 5-325 MG PO TABS: 1.0000 | ORAL_TABLET | Freq: Four times a day (QID) | ORAL | 0 refills | Status: DC | PRN

## 2021-02-03 NOTE — Telephone Encounter (Signed)
Refilled  Thanks,  Luan Pulling

## 2021-02-03 NOTE — Telephone Encounter (Signed)
Pt called in asking for a refill in the hydrocodone, last refilled 12/23/20 pt uses [facility] on [address]

## 2021-02-04 NOTE — Telephone Encounter (Signed)
Patient is aware rx is at pharmacy. 

## 2021-02-21 ENCOUNTER — Telehealth: Payer: Self-pay | Admitting: Registered Nurse

## 2021-02-21 ENCOUNTER — Encounter: Payer: Self-pay | Admitting: Registered Nurse

## 2021-02-21 ENCOUNTER — Other Ambulatory Visit: Payer: Self-pay

## 2021-02-21 ENCOUNTER — Ambulatory Visit (INDEPENDENT_AMBULATORY_CARE_PROVIDER_SITE_OTHER): Payer: Medicare Other | Admitting: Registered Nurse

## 2021-02-21 VITALS — BP 154/66 | HR 77 | Temp 98.1°F | Resp 16 | Ht 62.0 in | Wt 181.6 lb

## 2021-02-21 DIAGNOSIS — E7800 Pure hypercholesterolemia, unspecified: Secondary | ICD-10-CM

## 2021-02-21 DIAGNOSIS — R413 Other amnesia: Secondary | ICD-10-CM | POA: Diagnosis not present

## 2021-02-21 DIAGNOSIS — K219 Gastro-esophageal reflux disease without esophagitis: Secondary | ICD-10-CM

## 2021-02-21 DIAGNOSIS — E1129 Type 2 diabetes mellitus with other diabetic kidney complication: Secondary | ICD-10-CM | POA: Diagnosis not present

## 2021-02-21 DIAGNOSIS — R710 Precipitous drop in hematocrit: Secondary | ICD-10-CM

## 2021-02-21 DIAGNOSIS — N814 Uterovaginal prolapse, unspecified: Secondary | ICD-10-CM

## 2021-02-21 DIAGNOSIS — F411 Generalized anxiety disorder: Secondary | ICD-10-CM

## 2021-02-21 DIAGNOSIS — I10 Essential (primary) hypertension: Secondary | ICD-10-CM

## 2021-02-21 DIAGNOSIS — R809 Proteinuria, unspecified: Secondary | ICD-10-CM | POA: Diagnosis not present

## 2021-02-21 LAB — COMPREHENSIVE METABOLIC PANEL
ALT: 4 U/L (ref 0–35)
AST: 11 U/L (ref 0–37)
Albumin: 4.1 g/dL (ref 3.5–5.2)
Alkaline Phosphatase: 65 U/L (ref 39–117)
BUN: 10 mg/dL (ref 6–23)
CO2: 30 mEq/L (ref 19–32)
Calcium: 9.6 mg/dL (ref 8.4–10.5)
Chloride: 97 mEq/L (ref 96–112)
Creatinine, Ser: 0.83 mg/dL (ref 0.40–1.20)
GFR: 64.91 mL/min (ref >60.00)
Glucose, Bld: 110 mg/dL — ABNORMAL HIGH (ref 70–99)
Potassium: 4 mEq/L (ref 3.5–5.1)
Sodium: 134 mEq/L — ABNORMAL LOW (ref 135–145)
Total Bilirubin: 0.3 mg/dL (ref 0.2–1.2)
Total Protein: 6.7 g/dL (ref 6.0–8.3)

## 2021-02-21 LAB — LIPID PANEL
Cholesterol: 213 mg/dL — ABNORMAL HIGH (ref 0–200)
HDL: 72.4 mg/dL (ref >39.00)
LDL Cholesterol: 129 mg/dL — ABNORMAL HIGH (ref 0–99)
NonHDL: 140.25
Total CHOL/HDL Ratio: 3
Triglycerides: 57 mg/dL (ref 0.0–149.0)
VLDL: 11.4 mg/dL (ref 0.0–40.0)

## 2021-02-21 LAB — CBC WITH DIFFERENTIAL/PLATELET
Basophils Absolute: 0.1 10*3/uL (ref 0.0–0.1)
Basophils Relative: 1.3 % (ref 0.0–3.0)
Eosinophils Absolute: 0.2 10*3/uL (ref 0.0–0.7)
Eosinophils Relative: 2.8 % (ref 0.0–5.0)
HCT: 33.1 % — ABNORMAL LOW (ref 36.0–46.0)
Hemoglobin: 11 g/dL — ABNORMAL LOW (ref 12.0–15.0)
Lymphocytes Relative: 31.4 % (ref 12.0–46.0)
Lymphs Abs: 1.9 10*3/uL (ref 0.7–4.0)
MCHC: 33.2 g/dL (ref 30.0–36.0)
MCV: 83.2 fl (ref 78.0–100.0)
Monocytes Absolute: 0.5 10*3/uL (ref 0.1–1.0)
Monocytes Relative: 8.6 % (ref 3.0–12.0)
Neutro Abs: 3.3 10*3/uL (ref 1.4–7.7)
Neutrophils Relative %: 55.9 % (ref 43.0–77.0)
Platelets: 291 10*3/uL (ref 150.0–400.0)
RBC: 3.98 Mil/uL (ref 3.87–5.11)
RDW: 14.4 % (ref 11.5–15.5)
WBC: 5.9 10*3/uL (ref 4.0–10.5)

## 2021-02-21 LAB — HEMOGLOBIN A1C: Hgb A1c MFr Bld: 6.7 % — ABNORMAL HIGH (ref 4.6–6.5)

## 2021-02-21 LAB — TSH: TSH: 1.12 u[IU]/mL (ref 0.35–5.50)

## 2021-02-21 LAB — GLUCOSE, POCT (MANUAL RESULT ENTRY): POC Glucose: 113 mg/dl — AB (ref 70–99)

## 2021-02-21 MED ORDER — DONEPEZIL HCL 5 MG PO TABS: ORAL_TABLET | ORAL | 1 refills | Status: DC

## 2021-02-21 MED ORDER — OMEPRAZOLE 40 MG PO CPDR: DELAYED_RELEASE_CAPSULE | ORAL | 3 refills | Status: DC

## 2021-02-21 MED ORDER — AMLODIPINE BESYLATE 10 MG PO TABS: 10.0000 mg | ORAL_TABLET | Freq: Every day | ORAL | 1 refills | Status: DC

## 2021-02-21 MED ORDER — TRAZODONE HCL 100 MG PO TABS: 100.0000 mg | ORAL_TABLET | Freq: Every day | ORAL | 1 refills | Status: DC

## 2021-02-21 MED ORDER — HYDROXYZINE HCL 10 MG PO TABS: 10.0000 mg | ORAL_TABLET | Freq: Every evening | ORAL | 1 refills | Status: DC | PRN

## 2021-02-21 MED ORDER — ATORVASTATIN CALCIUM 10 MG PO TABS: 10.0000 mg | ORAL_TABLET | Freq: Every day | ORAL | 3 refills | Status: DC

## 2021-02-21 MED ORDER — BUPROPION HCL ER (XL) 150 MG PO TB24: 150.0000 mg | ORAL_TABLET | Freq: Every day | ORAL | 1 refills | Status: DC

## 2021-02-21 MED ORDER — ALPRAZOLAM 0.25 MG PO TABS: 0.1250 mg | ORAL_TABLET | Freq: Every evening | ORAL | 0 refills | Status: DC | PRN

## 2021-02-21 MED ORDER — METFORMIN HCL 500 MG PO TABS: ORAL_TABLET | ORAL | 1 refills | Status: DC

## 2021-02-21 MED ORDER — HYDROXYZINE HCL 10 MG PO TABS: ORAL_TABLET | ORAL | 1 refills | Status: DC

## 2021-02-21 MED ORDER — LISINOPRIL 40 MG PO TABS: ORAL_TABLET | ORAL | 1 refills | Status: DC

## 2021-02-21 NOTE — Telephone Encounter (Signed)
Corrected rx and resent

## 2021-02-21 NOTE — Telephone Encounter (Signed)
Take 0.5-1 tablet up to three times needed daily for anxiety, or to help sleep.  Thanks,  Luan Pulling

## 2021-02-21 NOTE — Patient Instructions (Addendum)
Casey [name] -  [name] to see you!  I will call with any concerning lab results  Let's continue to reduce the alprazolam. Try to use this as infrequently as possible.  Follow up with [name] at 10:30am on February 17.  See you in 6 mo  Thank you  Rich     If you have lab work done today you will be contacted with your lab results within the next 2 weeks.  If you have not heard from [name] then please contact us. The fastest way to get your results is to register for My Chart.   IF you received an x-ray today, you will receive an invoice from [facility]. Please contact [facility] at [phone] with questions or concerns regarding your invoice.   IF you received labwork today, you will receive an invoice from [facility]. Please contact [facility] at [phone] with questions or concerns regarding your invoice.   Our billing staff will not be able to assist you with questions regarding bills from these companies.  You will be contacted with the lab results as soon as they are available. The fastest way to get your results is to activate your My Chart account. Instructions are located on the last page of this paperwork. If you have not heard from [name] regarding the results in 2 weeks, please contact this office.

## 2021-02-21 NOTE — Telephone Encounter (Signed)
Pharmacy calling in asking to clarify directions for the hydroxyzine

## 2021-02-21 NOTE — Progress Notes (Signed)
Established Patient Office Visit  Subjective:  Patient ID: Casey [name], female    DOB: 1935/11/14  Age: 86 y.o. MRN: [id number]  CC:  Chief Complaint  Patient presents with   Follow-up    Patient states she is here for a 3 month follow up. Patient state she needs medication refills     HPI Casey [name] presents for 3 mo follow up   Had noted drop in hgb late 2022. Has stabilized. No new symptoms  Memory loss Seen by Neuro, missed follow up . Has been on donepazil 5mg  po qd but has run out. No AE, would like to resume Denies progression in cognitive decline or other neurocog changes.   T2dm Last A1c:  Lab Results  Component Value Date   HGBA1C 7.4 (H) 10/16/2020    Currently taking: metformin 500mg  po bid ac No new complications Reports good compliance with medications Diet has been stable Exercise habits have been stable  Hypertension: Patient Currently taking: lisinopril 40mg  po qd, amlodipine 5mg  po qd. Good effect. No AEs. Denies CV symptoms including: chest pain, shob, doe, headache, visual changes, fatigue, claudication, and dependent edema.   Previous readings and labs: BP Readings from Last 3 Encounters:  02/21/21 (!) 154/66  11/21/20 (!) 187/79  10/16/20 137/68   Lab Results  Component Value Date   CREATININE 0.78 10/16/2020    GERD Needs refill on omeprazole 40mg  po qd.  Good effect, no AE. Takes most days.  Sleep disturbance Has been asked to come off of benzos by neuro. We have weaned her down to 0.25mg  po qhs prn. She has started trazodone 100mg  po qhs and hydroxyzine 10mg  po tid prn for anxiety and sleep. Doing ok on this regimen. Active as tolerated through the day. Endorses good sleep hygiene.  Uterine prolapse Able to manually correct Does not have Gyn, has not in some time Would like to get est.  Otherwise no acute concerns. Feeling well overall.   Past Medical History:  Diagnosis Date   ALLERGIC RHINITIS    Allergy     ANXIETY    ARM PAIN, RIGHT    BENIGN POSITIONAL VERTIGO    Carcinoma in situ of colon    CONSTIPATION    CYSTOCELE WITH INCOMPLETE UTERINE PROLAPSE    Diabetes mellitus without complication ([place])    ESOPHAGEAL STRICTURE    FATIGUE    FLANK PAIN, LEFT    Headache(784.0)    HIP PAIN, LEFT    HYPERCHOLESTEROLEMIA    HYPERGLYCEMIA    HYPERTENSION, BENIGN ESSENTIAL 03/03/2007   LEG EDEMA, BILATERAL    Microscopic hematuria    OBESITY    OSTEOARTHRITIS, KNEE    OSTEOPOROSIS    Peripheral neuropathy, idiopathic    Problems with swallowing and mastication    SINUSITIS, RECURRENT    SPRAIN&STRAIN OTH SPEC SITES SHOULDER&UPPER ARM     Past Surgical History:  Procedure Laterality Date   COLONOSCOPY     KNEE ARTHROPLASTY Right 07/24/2020   Procedure: COMPUTER ASSISTED TOTAL KNEE ARTHROPLASTY;  Surgeon: Rod Can, MD;  Location: [facility];  Service: Orthopedics;  Laterality: Right;   LEFT HEART CATHETERIZATION WITH CORONARY ANGIOGRAM N/A 12/02/2010   Procedure: LEFT HEART CATHETERIZATION WITH CORONARY ANGIOGRAM;  Surgeon: Peter M Martinique, MD;  Location: [facility] CATH LAB;  Service: Cardiovascular;:: NORMAL CORONARY ARTERIES - NORMAL LV FUNCTION & EDP.     TRANSTHORACIC ECHOCARDIOGRAM  11/2010   Normal EF 60-65%. No RWMA.  Mild LAD dilation c/w Gr 1 DD.  --  PRETTY NORMAL ECHO    Family History  Problem Relation Age of Onset   Coronary artery disease Mother 49   Cancer Father 34   Heart disease Father    Colon cancer Other    Diabetes Other    Coronary artery disease Sister    Diabetes type II Sister    Cancer Sister    Coronary artery disease Brother    Kidney disease Brother    Coronary artery disease Brother    Breast cancer Neg Hx     Social History   Socioeconomic History   Marital status: Single    Spouse name: Not on file   Number of children: 1   Years of education: 12   Highest education level: High school graduate  Occupational History   Not on file  Tobacco Use    Smoking status: Never   Smokeless tobacco: Never  Vaping Use   Vaping Use: Never used  Substance and Sexual Activity   Alcohol use: No   Drug use: No   Sexual activity: Not Currently  Other Topics Concern   Not on file  Social History Narrative   She tries to stay active, but over the last year (20 19-20 20), she has been very limited by her right knee pain.   Is able to go grocery shopping.   Social Determinants of Health   Financial Resource Strain: Not on file  Food Insecurity: Not on file  Transportation Needs: Not on file  Physical Activity: Not on file  Stress: Not on file  Social Connections: Not on file  Intimate Partner Violence: Not on file    Outpatient Medications Prior to Visit  Medication Sig Dispense Refill   atorvastatin (LIPITOR) 10 MG tablet Take 1 tablet (10 mg total) by mouth daily. 90 tablet 3   azelastine (ASTELIN) 0.1 % nasal spray Place 1 spray into both nostrils 2 (two) times daily. Use in each nostril as directed 30 mL 12   buPROPion (WELLBUTRIN XL) 150 MG 24 hr tablet Take 1 tablet (150 mg total) by mouth daily. 90 tablet 1   Carboxymethylcellul-Glycerin (REFRESH OPTIVE) 1-0.9 % GEL Place 1 drop into both eyes daily.     donepezil (ARICEPT) 5 MG tablet TAKE 1 TABLET(5 MG) BY MOUTH AT BEDTIME 90 tablet 0   furosemide (LASIX) 40 MG tablet Take 1 tablet (40 mg total) by mouth daily. 90 tablet 1   HYDROcodone-acetaminophen (NORCO/VICODIN) 5-325 MG tablet Take 1 tablet by mouth every 6 (six) hours as needed for moderate pain. For chronic pain 90 tablet 0   hydrOXYzine (ATARAX/VISTARIL) 10 MG tablet TAKE 1 TABLET(10 MG) BY MOUTH THREE TIMES DAILY AS NEEDED 90 tablet 1   lisinopril (ZESTRIL) 40 MG tablet TAKE 1 TABLET(40 MG) BY MOUTH DAILY 90 tablet 0   meclizine (ANTIVERT) 50 MG tablet Take 1 tablet (50 mg total) by mouth 3 (three) times daily as needed. 30 tablet 0   metFORMIN (GLUCOPHAGE) 500 MG tablet TAKE 1 TABLET(500 MG) BY MOUTH TWICE DAILY WITH A MEAL  180 tablet 1   omeprazole (PRILOSEC) 40 MG capsule TAKE 1 CAPSULE(40 MG) BY MOUTH DAILY 90 capsule 3   traZODone (DESYREL) 100 MG tablet Take 1 tablet (100 mg total) by mouth at bedtime. 90 tablet 0   ALPRAZolam (XANAX) 0.25 MG tablet TAKE 1 TABLET(0.25 MG) BY MOUTH AT BEDTIME AS NEEDED FOR ANXIETY 30 tablet 0   amLODipine (NORVASC) 5 MG tablet TAKE 1 TABLET(5 MG) BY MOUTH DAILY 90 tablet  0   gabapentin (NEURONTIN) 600 MG tablet TAKE 1 TABLET(600 MG) BY MOUTH THREE TIMES DAILY 270 tablet 2   aspirin 81 MG chewable tablet Chew 1 tablet (81 mg total) by mouth 2 (two) times daily. (Patient not taking: Reported on 11/12/2020)     No facility-administered medications prior to visit.    No Known Allergies  ROS Review of Systems  Constitutional: Negative.   HENT: Negative.    Eyes: Negative.   Respiratory: Negative.    Cardiovascular: Negative.   Gastrointestinal: Negative.   Genitourinary: Negative.   Musculoskeletal: Negative.   Skin: Negative.   Neurological: Negative.   Psychiatric/Behavioral: Negative.    All other systems reviewed and are negative.    Objective:    Physical Exam Vitals and nursing note reviewed.  Constitutional:      General: She is not in acute distress.    Appearance: Normal appearance. She is normal weight. She is not ill-appearing, toxic-appearing or diaphoretic.  Cardiovascular:     Rate and Rhythm: Normal rate and regular rhythm.     Heart sounds: Normal heart sounds. No murmur heard.   No friction rub. No gallop.  Pulmonary:     Effort: Pulmonary effort is normal. No respiratory distress.     Breath sounds: Normal breath sounds. No stridor. No wheezing, rhonchi or rales.  Chest:     Chest wall: No tenderness.  Skin:    General: Skin is warm and dry.  Neurological:     General: No focal deficit present.     Mental Status: She is alert and oriented to person, place, and time. Mental status is at baseline.  Psychiatric:        Mood and Affect:  Mood normal.        Behavior: Behavior normal.        Thought Content: Thought content normal.        Judgment: Judgment normal.    BP (!) 154/66    Pulse 77    Temp 98.1 F (36.7 C) (Temporal)    Resp 16    Ht 5\' 2"  (1.575 m)    Wt 181 lb 9.6 oz (82.4 kg)    SpO2 100%    BMI 33.22 kg/m  Wt Readings from Last 3 Encounters:  02/21/21 181 lb 9.6 oz (82.4 kg)  11/21/20 173 lb 6.4 oz (78.7 kg)  10/16/20 178 lb 9.6 oz (81 kg)     Health Maintenance Due  Topic Date Due   Zoster Vaccines- Shingrix (2 of 2) 03/18/2018   FOOT EXAM  10/20/2019   OPHTHALMOLOGY EXAM  11/24/2019   COVID-19 Vaccine (4 - Booster for Pfizer series) 02/19/2020    There are no preventive care reminders to display for this patient.  Lab Results  Component Value Date   TSH 1.544 09/23/2020   Lab Results  Component Value Date   WBC 8.7 11/21/2020   HGB 12.3 11/21/2020   HCT 37.7 11/21/2020   MCV 81.8 11/21/2020   PLT 347.0 11/21/2020   Lab Results  Component Value Date   NA 139 10/16/2020   K 3.4 (L) 10/16/2020   CO2 29 10/16/2020   GLUCOSE 128 (H) 10/16/2020   BUN 9 10/16/2020   CREATININE 0.78 10/16/2020   BILITOT 0.4 10/16/2020   ALKPHOS 73 10/16/2020   AST 17 10/16/2020   ALT 13 10/16/2020   PROT 7.4 10/16/2020   ALBUMIN 4.2 10/16/2020   CALCIUM 9.8 10/16/2020   ANIONGAP 13 09/23/2020   GFR 70.11  10/16/2020   Lab Results  Component Value Date   CHOL 252 (H) 10/16/2020   Lab Results  Component Value Date   HDL 77.70 10/16/2020   Lab Results  Component Value Date   LDLCALC 152 (H) 10/16/2020   Lab Results  Component Value Date   TRIG 113.0 10/16/2020   Lab Results  Component Value Date   CHOLHDL 3 10/16/2020   Lab Results  Component Value Date   HGBA1C 7.4 (H) 10/16/2020      Assessment & Plan:   Problem List Items Addressed This Visit       Cardiovascular and Mediastinum   HYPERTENSION, BENIGN ESSENTIAL (Chronic)     Digestive   Gastroesophageal reflux disease  without esophagitis     Endocrine   Type 2 diabetes mellitus with microalbuminuria, without long-term current use of insulin (HCC) - Primary (Chronic)   Relevant Orders   POCT glucose (manual entry) (Completed)     Other   HYPERCHOLESTEROLEMIA   GAD (generalized anxiety disorder)   Other Visit Diagnoses     Memory loss       Drop in hemoglobin           No orders of the defined types were placed in this encounter.   Follow-up: No follow-ups on file.   PLAN Bp elevated again at this visit. Increase amlodipine to 10mg  po qd. Continue other medications as prescribed. Reduce alprazolam Labs collected. Will follow up with the patient as warranted. If labs stable, return in 6 mo Encourage pt to keep her appt with Ward Givens NP on Feb 17 for neuro follow up. Patient encouraged to call clinic with any questions, comments, or concerns.  Maximiano Coss, NP

## 2021-02-22 ENCOUNTER — Encounter: Payer: Self-pay | Admitting: Registered Nurse

## 2021-03-03 ENCOUNTER — Other Ambulatory Visit: Payer: Self-pay | Admitting: Neurology

## 2021-03-06 NOTE — Progress Notes (Signed)
PATIENT: Casey [name] DOB: March 21, 1935  REASON FOR VISIT: follow up Casey FROM: patient PRIMARY NEUROLOGIST: Dr. Brett Fairy  Casey OF PRESENT ILLNESS: Today 03/06/21:  Casey [name] is an 86 year old female with a Casey of memory disturbance.  She returns today for follow-up.  The patient currently lives at home alone.  She is able to complete all ADLs independently.  She has a niece that lives close by that helps her if needed.  She continues to cook without difficulty.  She does not operate a motor vehicle.  She manages her own medications, appointments and finances.  She is currently on Aricept 5 mg at bedtime.  Reports that she is tolerating this well.  Casey [name] is a 86 y.o. female and is seen here on 09-12-2020 in a RV for progressive memory loss.  She reports she feels it is harder to recall names and appointments.  She often finds herself sleeping in a choir, not sure for how long. She reports nervously shaking, trembling. She also state sh didn't sleep at all- and when asked since when she stated she filled trazodone on 09-09-2020. - she has felt it isn't working. No outside information.  She had an orthopedic surgery with Dr Lyla Glassing , total right knee. 07-24-2020. She is still on hydrocodone. Prescribed 07-25-2020.   REVIEW OF SYSTEMS: Out of a complete 14 system review of symptoms, the patient complains only of the following symptoms, and all other reviewed systems are negative.  ALLERGIES: No Known Allergies  HOME MEDICATIONS: Outpatient Medications Prior to Visit  Medication Sig Dispense Refill   ALPRAZolam (XANAX) 0.25 MG tablet Take 0.5 tablets (0.125 mg total) by mouth at bedtime as needed for anxiety. 30 tablet 0   amLODipine (NORVASC) 10 MG tablet Take 1 tablet (10 mg total) by mouth daily. 90 tablet 1   aspirin 81 MG chewable tablet Chew 1 tablet (81 mg total) by mouth 2 (two) times daily. (Patient not taking: Reported on 11/12/2020)     atorvastatin  (LIPITOR) 10 MG tablet Take 1 tablet (10 mg total) by mouth daily. 90 tablet 3   azelastine (ASTELIN) 0.1 % nasal spray Place 1 spray into both nostrils 2 (two) times daily. Use in each nostril as directed 30 mL 12   buPROPion (WELLBUTRIN XL) 150 MG 24 hr tablet Take 1 tablet (150 mg total) by mouth daily. 90 tablet 1   Carboxymethylcellul-Glycerin (REFRESH OPTIVE) 1-0.9 % GEL Place 1 drop into both eyes daily.     donepezil (ARICEPT) 5 MG tablet TAKE 1 TABLET(5 MG) BY MOUTH AT BEDTIME 90 tablet 1   furosemide (LASIX) 40 MG tablet Take 1 tablet (40 mg total) by mouth daily. 90 tablet 1   HYDROcodone-acetaminophen (NORCO/VICODIN) 5-325 MG tablet Take 1 tablet by mouth every 6 (six) hours as needed for moderate pain. For chronic pain 90 tablet 0   hydrOXYzine (ATARAX) 10 MG tablet Take 0.5-1 tablet up to three times needed daily for anxiety, or to help sleep. 90 tablet 1   lisinopril (ZESTRIL) 40 MG tablet TAKE 1 TABLET(40 MG) BY MOUTH DAILY 90 tablet 1   meclizine (ANTIVERT) 50 MG tablet Take 1 tablet (50 mg total) by mouth 3 (three) times daily as needed. 30 tablet 0   metFORMIN (GLUCOPHAGE) 500 MG tablet TAKE 1 TABLET(500 MG) BY MOUTH TWICE DAILY WITH A MEAL 180 tablet 1   omeprazole (PRILOSEC) 40 MG capsule TAKE 1 CAPSULE(40 MG) BY MOUTH DAILY 90 capsule 3   traZODone (  DESYREL) 100 MG tablet Take 1 tablet (100 mg total) by mouth at bedtime. 90 tablet 1   No facility-administered medications prior to visit.    PAST MEDICAL Casey: Past Medical Casey:  Diagnosis Date   ALLERGIC RHINITIS    Allergy    ANXIETY    ARM PAIN, RIGHT    BENIGN POSITIONAL VERTIGO    Carcinoma in situ of colon    CONSTIPATION    CYSTOCELE WITH INCOMPLETE UTERINE PROLAPSE    Diabetes mellitus without complication ([place])    ESOPHAGEAL STRICTURE    FATIGUE    FLANK PAIN, LEFT    Headache(784.0)    HIP PAIN, LEFT    HYPERCHOLESTEROLEMIA    HYPERGLYCEMIA    HYPERTENSION, BENIGN ESSENTIAL 03/03/2007   LEG  EDEMA, BILATERAL    Microscopic hematuria    OBESITY    OSTEOARTHRITIS, KNEE    OSTEOPOROSIS    Peripheral neuropathy, idiopathic    Problems with swallowing and mastication    SINUSITIS, RECURRENT    SPRAIN&STRAIN OTH SPEC SITES SHOULDER&UPPER ARM     PAST SURGICAL Casey: Past Surgical Casey:  Procedure Laterality Date   COLONOSCOPY     KNEE ARTHROPLASTY Right 07/24/2020   Procedure: COMPUTER ASSISTED TOTAL KNEE ARTHROPLASTY;  Surgeon: Rod Can, MD;  Location: [facility];  Service: Orthopedics;  Laterality: Right;   LEFT HEART CATHETERIZATION WITH CORONARY ANGIOGRAM N/A 12/02/2010   Procedure: LEFT HEART CATHETERIZATION WITH CORONARY ANGIOGRAM;  Surgeon: Peter M Martinique, MD;  Location: [facility] CATH LAB;  Service: Cardiovascular;:: NORMAL CORONARY ARTERIES - NORMAL LV FUNCTION & EDP.     TRANSTHORACIC ECHOCARDIOGRAM  11/2010   Normal EF 60-65%. No RWMA.  Mild LAD dilation c/w Gr 1 DD.  -- PRETTY NORMAL ECHO    FAMILY Casey: Family Casey  Problem Relation Age of Onset   Coronary artery disease Mother 60   Cancer Father 66   Heart disease Father    Colon cancer Other    Diabetes Other    Coronary artery disease Sister    Diabetes type II Sister    Cancer Sister    Coronary artery disease Brother    Kidney disease Brother    Coronary artery disease Brother    Breast cancer Neg Hx     SOCIAL Casey: Social Casey   Socioeconomic Casey   Marital status: Single    Spouse name: Not on file   Number of children: 1   Years of education: 12   Highest education level: High school graduate  Occupational Casey   Not on file  Tobacco Use   Smoking status: Never   Smokeless tobacco: Never  Vaping Use   Vaping Use: Never used  Substance and Sexual Activity   Alcohol use: No   Drug use: No   Sexual activity: Not Currently  Other Topics Concern   Not on file  Social Casey Narrative   She tries to stay active, but over the last year (20 19-20 20), she has been  very limited by her right knee pain.   Is able to go grocery shopping.   Social Determinants of Health   Financial Resource Strain: Not on file  Food Insecurity: Not on file  Transportation Needs: Not on file  Physical Activity: Not on file  Stress: Not on file  Social Connections: Not on file  Intimate Partner Violence: Not on file      PHYSICAL EXAM  Vitals:   03/07/21 1023  BP: (!) 154/75  Pulse: 84  Weight: 178 lb (80.7 kg)  Height: 5\' 4"  (1.626 m)   Body mass index is 30.55 kg/m.  MMSE - Mini Mental State Exam 03/07/2021 09/12/2020 01/03/2020  Orientation to time 5 5 5   Orientation to Place 5 4 4   Registration 3 3 3   Attention/ Calculation 0 2 0  Recall 3 1 2   Language- name 2 objects 2 2 2   Language- repeat 0 0 1  Language- follow 3 step command 3 3 3   Language- read & follow direction 1 1 1   Write a sentence 1 1 1   Copy design 0 0 0  Total score 23 22 22      Generalized: Well developed, in no acute distress   Neurological examination  Mentation: Alert oriented to time, place, Casey taking. Follows all commands speech and language fluent Cranial nerve II-XII: Pupils were equal round reactive to light. Extraocular movements were full, visual field were full on confrontational test. Facial sensation and strength were normal.  Head turning and shoulder shrug  were normal and symmetric. Motor: The motor testing reveals 5 over 5 strength of all 4 extremities. Good symmetric motor tone is noted throughout.  Sensory: Sensory testing is intact to soft touch on all 4 extremities. No evidence of extinction is noted.  Coordination: Cerebellar testing reveals good finger-nose-finger and heel-to-shin bilaterally.  Gait and station: Uses a cane when ambulating. Reflexes: Deep tendon reflexes are symmetric and normal bilaterally.   DIAGNOSTIC DATA (LABS, IMAGING, TESTING) - I reviewed patient records, labs, notes, testing and imaging myself where available.  Lab  Results  Component Value Date   WBC 5.9 02/21/2021   HGB 11.0 (L) 02/21/2021   HCT 33.1 (L) 02/21/2021   MCV 83.2 02/21/2021   PLT 291.0 02/21/2021      Component Value Date/Time   NA 134 (L) 02/21/2021 1128   NA 139 01/03/2020 1528   K 4.0 02/21/2021 1128   CL 97 02/21/2021 1128   CO2 30 02/21/2021 1128   GLUCOSE 110 (H) 02/21/2021 1128   BUN 10 02/21/2021 1128   BUN 12 01/03/2020 1528   CREATININE 0.83 02/21/2021 1128   CREATININE 0.95 (H) 07/02/2015 1154   CALCIUM 9.6 02/21/2021 1128   PROT 6.7 02/21/2021 1128   PROT 7.4 01/03/2020 1528   ALBUMIN 4.1 02/21/2021 1128   ALBUMIN 4.8 (H) 01/03/2020 1528   AST 11 02/21/2021 1128   ALT 4 02/21/2021 1128   ALKPHOS 65 02/21/2021 1128   BILITOT 0.3 02/21/2021 1128   BILITOT 0.2 01/03/2020 1528   GFRNONAA >60 09/23/2020 1540   GFRNONAA 58 (L) 07/02/2015 1154   GFRAA 66 01/03/2020 1528   GFRAA 66 07/02/2015 1154   Lab Results  Component Value Date   CHOL 213 (H) 02/21/2021   HDL 72.40 02/21/2021   LDLCALC 129 (H) 02/21/2021   TRIG 57.0 02/21/2021   CHOLHDL 3 02/21/2021   Lab Results  Component Value Date   HGBA1C 6.7 (H) 02/21/2021   Lab Results  Component Value Date   VITAMINB12 >2000 (H) 07/17/2019   Lab Results  Component Value Date   TSH 1.12 02/21/2021      ASSESSMENT AND PLAN 86 y.o. year old female  has a past medical Casey of ALLERGIC RHINITIS, Allergy, ANXIETY, ARM PAIN, RIGHT, BENIGN POSITIONAL VERTIGO, Carcinoma in situ of colon, CONSTIPATION, CYSTOCELE WITH INCOMPLETE UTERINE PROLAPSE, Diabetes mellitus without complication ([place]), ESOPHAGEAL STRICTURE, FATIGUE, FLANK PAIN, LEFT, Headache(784.0), HIP PAIN, LEFT, HYPERCHOLESTEROLEMIA, HYPERGLYCEMIA, HYPERTENSION, BENIGN ESSENTIAL (03/03/2007), LEG EDEMA, BILATERAL, Microscopic  hematuria, OBESITY, OSTEOARTHRITIS, KNEE, OSTEOPOROSIS, Peripheral neuropathy, idiopathic, Problems with swallowing and mastication, SINUSITIS, RECURRENT, and SPRAIN&STRAIN OTH  SPEC SITES SHOULDER&UPPER ARM. here with:   Cognitive deficit with impaired visual-spatial function  MMSE Stable 23/30 previously 22/30 Increase Aricept 10 mg at bedtime Reviewed the medication with the patient and potential side effects If symptoms worsen or you develop new symptoms let us know FU 1 year or sooner if needed   Ward Givens, MSN, NP-C 03/06/2021, 8:36 AM [facility] [address] [place], [place] [address] [phone]

## 2021-03-07 ENCOUNTER — Ambulatory Visit (INDEPENDENT_AMBULATORY_CARE_PROVIDER_SITE_OTHER): Payer: Medicare Other | Admitting: Adult Health

## 2021-03-07 ENCOUNTER — Other Ambulatory Visit: Payer: Self-pay

## 2021-03-07 ENCOUNTER — Encounter: Payer: Self-pay | Admitting: Adult Health

## 2021-03-07 DIAGNOSIS — R413 Other amnesia: Secondary | ICD-10-CM

## 2021-03-07 MED ORDER — DONEPEZIL HCL 10 MG PO TABS: ORAL_TABLET | ORAL | 5 refills | Status: DC

## 2021-03-07 NOTE — Patient Instructions (Signed)
Your Plan:  Memory score is stable INCREASE Aricept to 10 mg at bedtime- new prescription send to pharmacy  Thank you for coming to see [name] at [facility]. I hope we have been able to provide you high quality care today.  You may receive a patient satisfaction survey over the next few weeks. We would appreciate your feedback and comments so that we may continue to improve ourselves and the health of our patients.

## 2021-03-10 ENCOUNTER — Ambulatory Visit: Payer: Medicare Other | Admitting: Adult Health

## 2021-03-13 ENCOUNTER — Other Ambulatory Visit: Payer: Self-pay | Admitting: Registered Nurse

## 2021-03-13 DIAGNOSIS — M170 Bilateral primary osteoarthritis of knee: Secondary | ICD-10-CM

## 2021-03-13 NOTE — Telephone Encounter (Signed)
Patient is requesting a refill of the following medications: Requested Prescriptions   Pending Prescriptions Disp Refills   HYDROcodone-acetaminophen (NORCO/VICODIN) 5-325 MG tablet 90 tablet 0    Sig: Take 1 tablet by mouth every 6 (six) hours as needed for moderate pain. For chronic pain    Date of patient request: 03/13/2021 Last office visit: 02/21/2021 Date of last refill: 02/03/2021 Last refill amount: 90 tablets  Follow up time period per chart: 04/15/2021

## 2021-03-13 NOTE — Telephone Encounter (Signed)
Pt called in asking for a refill on the hydrocodone, last refill 02/14/21, pt uses [facility] on [address].  Please advise

## 2021-03-13 NOTE — Telephone Encounter (Signed)
Patient medication request has been sent  ?

## 2021-03-14 MED ORDER — HYDROCODONE-ACETAMINOPHEN 5-325 MG PO TABS: 1.0000 | ORAL_TABLET | Freq: Four times a day (QID) | ORAL | 0 refills | Status: DC | PRN

## 2021-03-26 ENCOUNTER — Other Ambulatory Visit: Payer: Self-pay

## 2021-03-26 ENCOUNTER — Other Ambulatory Visit: Payer: Self-pay | Admitting: Registered Nurse

## 2021-03-26 ENCOUNTER — Encounter: Payer: Self-pay | Admitting: Obstetrics and Gynecology

## 2021-03-26 ENCOUNTER — Ambulatory Visit (INDEPENDENT_AMBULATORY_CARE_PROVIDER_SITE_OTHER): Payer: Medicare Other | Admitting: Obstetrics and Gynecology

## 2021-03-26 VITALS — BP 156/74 | HR 82 | Resp 16 | Ht 62.0 in | Wt 170.0 lb

## 2021-03-26 DIAGNOSIS — R232 Flushing: Secondary | ICD-10-CM | POA: Diagnosis not present

## 2021-03-26 DIAGNOSIS — N814 Uterovaginal prolapse, unspecified: Secondary | ICD-10-CM

## 2021-03-26 DIAGNOSIS — F411 Generalized anxiety disorder: Secondary | ICD-10-CM

## 2021-03-26 NOTE — Telephone Encounter (Signed)
Pt called in asking or a refill on the Alprazolam, pt uses [facility] on [address]  ?

## 2021-03-26 NOTE — Telephone Encounter (Signed)
Medication request sent to PCP  

## 2021-03-26 NOTE — Patient Instructions (Signed)
Pelvic Organ Prolapse Pelvic organ prolapse is a condition in women that involves the stretching, bulging, or dropping of pelvic organs into an abnormal position, past the opening of the vagina. It happens when the muscles and tissues that surround and support pelvic structures become weak or stretched. Pelvic organ prolapse can involve the: Vagina (vaginal prolapse). Uterus (uterine prolapse). Bladder (cystocele). Rectum (rectocele). Intestines (enterocele). When organs other than the vagina are involved, they often bulge into thevagina or protrude from the vagina, depending on how severe the prolapse is. What are the causes? This condition may be caused by: Pregnancy, labor, and childbirth. Past pelvic surgery. Lower levels of the hormone estrogen due to menopause. Consistently lifting more than 50 lb (23 kg). Obesity. Long-term difficulty passing stool (chronic constipation). Long-term, or chronic, cough. Fluid buildup in the abdomen due to certain conditions. What are the signs or symptoms? Symptoms of this condition include: Leaking a little urine (loss of bladder control) when you cough, sneeze, strain, and exercise (stress incontinence). This may be worse immediately after childbirth. It may gradually improve over time. Feeling pressure in your pelvis or vagina. This pressure may increase when you cough or when you are passing stool. A bulge that protrudes from the opening of your vagina. Difficulty passing urine or stool. Pain in your lower back. Pain or discomfort during sex, or decreased interest in sex. Repeated bladder infections (urinary tract infections). Difficulty inserting a tampon. In some people, this condition causes no symptoms. How is this diagnosed? This condition may be diagnosed based on a vaginal and rectal exam. During the exam, you may be asked to cough and strain while you are lying down, sitting, and standing up. Your health care provider will determine if  other tests arerequired, such as bladder function tests. How is this treated? Treatment for this condition may depend on your symptoms. Treatment may include: Lifestyle changes, such as drinking plenty of fluids and eating foods that are high in fiber. Emptying your bladder at scheduled times (bladder training therapy). This can help reduce or avoid urinary incontinence. Estrogen. This may help mild prolapse by increasing the strength and tone of pelvic floor muscles. Kegel exercises. These may help mild cases of prolapse by strengthening and tightening the muscles of the pelvic floor. A soft, flexible device that helps support the vaginal walls and keep pelvic organs in place (pessary). This is inserted into your vagina by your health care provider. Surgery. This is often the only form of treatment for severe prolapse. Follow these instructions at home: Eating and drinking Avoid drinking beverages that contain caffeine or alcohol. Increase your intake of high-fiber foods to decrease constipation and straining during bowel movements. Activity Lose weight if recommended by your health care provider. Avoid heavy lifting and straining with exercise and work. Do not hold your breath when you perform mild to moderate lifting and exercise activities. Limit your activities as directed by your health care provider. Do Kegel exercises as directed by your health care provider. To do this: Squeeze your pelvic floor muscles tight. You should feel a tight lift in your rectal area and a tightness in your vaginal area. Keep your stomach, buttocks, and legs relaxed. Hold the muscles tight for up to 10 seconds. Then relax your muscles. Repeat this exercise 50 times a day, or as much as told by your health care provider. Continue to do this exercise for at least 4-6 weeks, or for as long as told by your health care provider. General instructions   Take over-the-counter and prescription medicines only as told by  your health care provider. Wear a sanitary pad or adult diapers if you have urinary incontinence. If you have a pessary, take care of it as told by your health care provider. Keep all follow-up visits. This is important. Contact a health care provider if you: Have symptoms that interfere with your daily activities or sex life. Need medicine to help with the discomfort. Notice bleeding from your vagina that is not related to your menstrual period. Have a fever. Have pain or bleeding when you urinate. Have bleeding when you pass stool. Pass urine when you have sex. Have chronic constipation. Have a pessary that falls out. Have a foul-smelling vaginal discharge. Have an unusual, low pain in your abdomen. Get help right away if you: Cannot pass urine. Summary Pelvic organ prolapse is the stretching, bulging, or dropping of pelvic organs into an abnormal position. It happens when the muscles and tissues that surround and support pelvic structures become weak or stretched. When organs other than the vagina are involved, they often bulge into the vagina or protrude from it, depending on how severe the prolapse is. In most cases, this condition needs to be treated only if it produces symptoms. Treatment may include lifestyle changes, estrogen, Kegel exercises, pessary insertion, or surgery. Avoid heavy lifting and straining with exercise and work. Do not hold your breath when you perform mild to moderate lifting and exercise activities. Limit your activities as directed by your health care provider. This information is not intended to replace advice given to you by your health care provider. Make sure you discuss any questions you have with your healthcare provider. Document Revised: 07/03/2019 Document Reviewed: 07/03/2019 Elsevier Patient Education  2022 Elsevier Inc.  

## 2021-03-26 NOTE — Telephone Encounter (Signed)
Patient is requesting a refill of the following medications: ?Requested Prescriptions  ? ?Pending Prescriptions Disp Refills  ? ALPRAZolam (XANAX) 0.25 MG tablet 30 tablet 0  ?  Sig: Take 0.5 tablets (0.125 mg total) by mouth at bedtime as needed for anxiety.  ? ? ?Date of patient request: 03/26/2021 ?Last office visit:  ?Date of last refill: 02/21/2021 ?Last refill amount: 30 tablets ?Follow up time period per chart: 04/15/2021 ? ?

## 2021-03-26 NOTE — Progress Notes (Addendum)
GYNECOLOGY  VISIT   HPI: 86 y.o.   Single  African American  female   G1P1 with No LMP recorded. Patient is postmenopausal.   here for uterine prolapse for a while according to patient.  Referred by her PCP.   No vaginal bleeding.   Some discomfort every now and then.  A nagging feeling.   No urinary incontinence now or in the past.   No dysuria.  No UTIs.   Has some constipation. Uses stool softener, which helps. No fecal incontinence.   Not sexually active.   States her hot flashes are getting worse.  Asks if they ever stop.   Lives along in her home.   GYNECOLOGIC HISTORY: No LMP recorded. Patient is postmenopausal. Contraception:  PMP Menopausal hormone therapy:  none Last mammogram:  11-14-20 Neg/BiRads1 Last pap smear:   08-10-12 Neg        OB History     Gravida  1   Para  1   Term      Preterm      AB      Living  1      SAB      IAB      Ectopic      Multiple      Live Births                 Patient Active Problem List   Diagnosis Date Noted   Osteoarthritis of right knee 07/24/2020   Cognitive deficit with impaired visuospatial function 01/03/2020   Memory impairment of gradual onset 01/03/2020   Anxiety about health 01/03/2020   Dementia without behavioral disturbance (HCC) 01/03/2020   Gastroesophageal reflux disease without esophagitis 07/17/2019   Preoperative cardiovascular examination 05/13/2018   Vitamin B12 deficiency 07/14/2017   Primary osteoarthritis of left knee 07/07/2013   Type 2 diabetes mellitus with microalbuminuria, without long-term current use of insulin (HCC) 05/26/2012   Anemia 10/23/2011   Encounter for medication monitoring 10/23/2011   Hyponatremia 12/01/2010    Class: Acute   Chest pain with moderate risk for cardiac etiology 11/26/2010    Class: Acute   CYSTOCELE WITH INCOMPLETE UTERINE PROLAPSE 03/10/2010   HYPERGLYCEMIA 02/06/2010   ESOPHAGEAL STRICTURE 12/11/2009   Obesity 12/09/2009    PROBLEMS WITH SWALLOWING AND MASTICATION 12/09/2009   MICROSCOPIC HEMATURIA 07/29/2009   ARM PAIN, RIGHT 03/28/2009   Fatigue 03/28/2009   CONSTIPATION 09/25/2008   Sinusitis, chronic 02/23/2008   Osteoarthrosis involving lower leg 09/28/2007   LEG EDEMA, BILATERAL 08/31/2007   FLANK PAIN, LEFT 06/08/2007   SPRAIN&STRAIN OTH SPEC SITES SHOULDER&UPPER ARM 06/08/2007   BENIGN POSITIONAL VERTIGO 04/25/2007   Headache 03/24/2007   HYPERCHOLESTEROLEMIA 03/03/2007   GAD (generalized anxiety disorder) 03/03/2007   HYPERTENSION, BENIGN ESSENTIAL 03/03/2007   Allergic rhinitis 03/03/2007   HIP PAIN, LEFT 03/03/2007   Osteoporosis 03/03/2007    Past Medical History:  Diagnosis Date   ALLERGIC RHINITIS    Allergy    ANXIETY    ARM PAIN, RIGHT    BENIGN POSITIONAL VERTIGO    Carcinoma in situ of colon    CONSTIPATION    CYSTOCELE WITH INCOMPLETE UTERINE PROLAPSE    Diabetes mellitus without complication (HCC)    ESOPHAGEAL STRICTURE    FATIGUE    FLANK PAIN, LEFT    Headache(784.0)    HIP PAIN, LEFT    HYPERCHOLESTEROLEMIA    HYPERGLYCEMIA    HYPERTENSION, BENIGN ESSENTIAL 03/03/2007   LEG EDEMA, BILATERAL    Microscopic hematuria  OBESITY    OSTEOARTHRITIS, KNEE    OSTEOPOROSIS    Peripheral neuropathy, idiopathic    Problems with swallowing and mastication    SINUSITIS, RECURRENT    SPRAIN&STRAIN OTH SPEC SITES SHOULDER&UPPER ARM     Past Surgical History:  Procedure Laterality Date   COLONOSCOPY     KNEE ARTHROPLASTY Right 07/24/2020   Procedure: COMPUTER ASSISTED TOTAL KNEE ARTHROPLASTY;  Surgeon: Samson Frederic, MD;  Location: [facility];  Service: Orthopedics;  Laterality: Right;   LEFT HEART CATHETERIZATION WITH CORONARY ANGIOGRAM N/A 12/02/2010   Procedure: LEFT HEART CATHETERIZATION WITH CORONARY ANGIOGRAM;  Surgeon: Peter M Swaziland, MD;  Location: [facility] CATH LAB;  Service: Cardiovascular;:: NORMAL CORONARY ARTERIES - NORMAL LV FUNCTION & EDP.     TRANSTHORACIC  ECHOCARDIOGRAM  11/2010   Normal EF 60-65%. No RWMA.  Mild LAD dilation c/w Gr 1 DD.  -- PRETTY NORMAL ECHO    Current Outpatient Medications  Medication Sig Dispense Refill   ALPRAZolam (XANAX) 0.25 MG tablet Take 0.5 tablets (0.125 mg total) by mouth at bedtime as needed for anxiety. 30 tablet 0   amLODipine (NORVASC) 10 MG tablet Take 1 tablet (10 mg total) by mouth daily. 90 tablet 1   aspirin 81 MG chewable tablet Chew 1 tablet (81 mg total) by mouth 2 (two) times daily.     atorvastatin (LIPITOR) 10 MG tablet Take 1 tablet (10 mg total) by mouth daily. 90 tablet 3   azelastine (ASTELIN) 0.1 % nasal spray Place 1 spray into both nostrils 2 (two) times daily. Use in each nostril as directed 30 mL 12   buPROPion (WELLBUTRIN XL) 150 MG 24 hr tablet Take 1 tablet (150 mg total) by mouth daily. 90 tablet 1   Carboxymethylcellul-Glycerin (REFRESH OPTIVE) 1-0.9 % GEL Place 1 drop into both eyes daily.     donepezil (ARICEPT) 10 MG tablet TAKE 1 TABLET(5 MG) BY MOUTH AT BEDTIME 90 tablet 5   furosemide (LASIX) 40 MG tablet Take 1 tablet (40 mg total) by mouth daily. 90 tablet 1   HYDROcodone-acetaminophen (NORCO/VICODIN) 5-325 MG tablet Take 1 tablet by mouth every 6 (six) hours as needed for moderate pain. For chronic pain 90 tablet 0   hydrOXYzine (ATARAX) 10 MG tablet Take 0.5-1 tablet up to three times needed daily for anxiety, or to help sleep. 90 tablet 1   lisinopril (ZESTRIL) 40 MG tablet TAKE 1 TABLET(40 MG) BY MOUTH DAILY 90 tablet 1   meclizine (ANTIVERT) 50 MG tablet Take 1 tablet (50 mg total) by mouth 3 (three) times daily as needed. 30 tablet 0   metFORMIN (GLUCOPHAGE) 500 MG tablet TAKE 1 TABLET(500 MG) BY MOUTH TWICE DAILY WITH A MEAL 180 tablet 1   omeprazole (PRILOSEC) 40 MG capsule TAKE 1 CAPSULE(40 MG) BY MOUTH DAILY 90 capsule 3   traZODone (DESYREL) 100 MG tablet Take 1 tablet (100 mg total) by mouth at bedtime. 90 tablet 1   No current facility-administered medications for  this visit.     ALLERGIES: Patient has no known allergies.  Family History  Problem Relation Age of Onset   Coronary artery disease Mother 51   Cancer Father 72   Heart disease Father    Colon cancer Other    Diabetes Other    Coronary artery disease Sister    Diabetes type II Sister    Cancer Sister    Coronary artery disease Brother    Kidney disease Brother    Coronary artery disease Brother  Breast cancer Neg Hx     Social History   Socioeconomic History   Marital status: Single    Spouse name: Not on file   Number of children: 1   Years of education: 12   Highest education level: High school graduate  Occupational History   Not on file  Tobacco Use   Smoking status: Never   Smokeless tobacco: Never  Vaping Use   Vaping Use: Never used  Substance and Sexual Activity   Alcohol use: No   Drug use: No   Sexual activity: Not Currently  Other Topics Concern   Not on file  Social History Narrative   She tries to stay active, but over the last year (20 19-20 20), she has been very limited by her right knee pain.   Is able to go grocery shopping.   Social Determinants of Health   Financial Resource Strain: Not on file  Food Insecurity: Not on file  Transportation Needs: Not on file  Physical Activity: Not on file  Stress: Not on file  Social Connections: Not on file  Intimate Partner Violence: Not on file    Review of Systems  All other systems reviewed and are negative.  PHYSICAL EXAMINATION:    BP (!) 156/74    Pulse 82    Resp 16    Ht 5\' 2"  (1.575 m)    Wt 170 lb (77.1 kg)    BMI 31.09 kg/m     General appearance: alert, cooperative and appears stated age Head: Normocephalic, without obvious abnormality, atraumatic Neck: no adenopathy, supple, symmetrical, trachea midline and thyroid normal to inspection and palpation Lungs: clear to auscultation bilaterally Heart: regular rate and rhythm Abdomen: soft, non-tender, no masses,  no organomegaly No  abnormal inguinal nodes palpated Neurologic: Grossly normal  Pelvic: External genitalia:  no lesions              Urethra:  normal appearing urethra with no masses, tenderness or lesions              Bartholins and Skenes: normal                 Vagina: normal appearing vagina with normal color and discharge, no lesions.  Second degree uterine prolapse.               Cervix: no lesions                Bimanual Exam:  Uterus:  normal size, contour, position, consistency, mobility, non-tender.               Adnexa: no mass, fullness, tenderness              Rectal exam: yes.  Confirms.              Anus:  normal sphincter tone, no lesions  Chaperone was present for exam:  , CMA  ASSESSMENT  Uterine prolapse.  Constipation.  Hot flashes.   PLAN  We discussed risk factors for uterine prolapse.  Options for care:  physical therapy, vaginal hysterectomy with reconstruction, and pessary discussed.  She will return for a pessary fitting.   I recommend she follow up with her PCP regarding her hot flashes, which I would not expect are due to menopause at her current age.   An After Visit Summary was printed and given to the patient.

## 2021-03-27 MED ORDER — ALPRAZOLAM 0.25 MG PO TABS: 0.1250 mg | ORAL_TABLET | Freq: Every evening | ORAL | 0 refills | Status: DC | PRN

## 2021-04-14 ENCOUNTER — Telehealth: Payer: Self-pay

## 2021-04-14 NOTE — Telephone Encounter (Signed)
ERROR

## 2021-04-15 ENCOUNTER — Other Ambulatory Visit: Payer: Self-pay

## 2021-04-15 ENCOUNTER — Ambulatory Visit (INDEPENDENT_AMBULATORY_CARE_PROVIDER_SITE_OTHER): Payer: Medicare Other | Admitting: Registered Nurse

## 2021-04-15 ENCOUNTER — Encounter: Payer: Self-pay | Admitting: Registered Nurse

## 2021-04-15 VITALS — BP 105/61 | HR 74 | Temp 97.8°F | Resp 18 | Ht 62.0 in | Wt 174.6 lb

## 2021-04-15 DIAGNOSIS — R0981 Nasal congestion: Secondary | ICD-10-CM | POA: Diagnosis not present

## 2021-04-15 DIAGNOSIS — R197 Diarrhea, unspecified: Secondary | ICD-10-CM

## 2021-04-15 DIAGNOSIS — R809 Proteinuria, unspecified: Secondary | ICD-10-CM

## 2021-04-15 DIAGNOSIS — E1129 Type 2 diabetes mellitus with other diabetic kidney complication: Secondary | ICD-10-CM

## 2021-04-15 DIAGNOSIS — I10 Essential (primary) hypertension: Secondary | ICD-10-CM | POA: Diagnosis not present

## 2021-04-15 DIAGNOSIS — M170 Bilateral primary osteoarthritis of knee: Secondary | ICD-10-CM

## 2021-04-15 LAB — POCT GLYCOSYLATED HEMOGLOBIN (HGB A1C): Hemoglobin A1C: 7 % — AB (ref 4.0–5.6)

## 2021-04-15 LAB — URINALYSIS, ROUTINE W REFLEX MICROSCOPIC
Ketones, ur: NEGATIVE
Leukocytes,Ua: NEGATIVE
Nitrite: NEGATIVE
Specific Gravity, Urine: >=1.03 — AB (ref 1.000–1.030)
Total Protein, Urine: 30 — AB
Urine Glucose: NEGATIVE
Urobilinogen, UA: 0.2 (ref 0.0–1.0)
pH: 6 (ref 5.0–8.0)

## 2021-04-15 LAB — COMPREHENSIVE METABOLIC PANEL
ALT: 13 U/L (ref 0–35)
AST: 16 U/L (ref 0–37)
Albumin: 4.6 g/dL (ref 3.5–5.2)
Alkaline Phosphatase: 88 U/L (ref 39–117)
BUN: 10 mg/dL (ref 6–23)
CO2: 25 mEq/L (ref 19–32)
Calcium: 10.1 mg/dL (ref 8.4–10.5)
Chloride: 98 mEq/L (ref 96–112)
Creatinine, Ser: 0.9 mg/dL (ref 0.40–1.20)
GFR: 58.84 mL/min — ABNORMAL LOW (ref >60.00)
Glucose, Bld: 147 mg/dL — ABNORMAL HIGH (ref 70–99)
Potassium: 3.8 mEq/L (ref 3.5–5.1)
Sodium: 134 mEq/L — ABNORMAL LOW (ref 135–145)
Total Bilirubin: 0.4 mg/dL (ref 0.2–1.2)
Total Protein: 7.4 g/dL (ref 6.0–8.3)

## 2021-04-15 LAB — LIPID PANEL
Cholesterol: 189 mg/dL (ref 0–200)
HDL: 90.9 mg/dL (ref >39.00)
LDL Cholesterol: 86 mg/dL (ref 0–99)
NonHDL: 97.95
Total CHOL/HDL Ratio: 2
Triglycerides: 59 mg/dL (ref 0.0–149.0)
VLDL: 11.8 mg/dL (ref 0.0–40.0)

## 2021-04-15 LAB — TSH: TSH: 1.29 u[IU]/mL (ref 0.35–5.50)

## 2021-04-15 LAB — CBC WITH DIFFERENTIAL/PLATELET
Basophils Absolute: 0.1 10*3/uL (ref 0.0–0.1)
Basophils Relative: 1.1 % (ref 0.0–3.0)
Eosinophils Absolute: 0.1 10*3/uL (ref 0.0–0.7)
Eosinophils Relative: 1.3 % (ref 0.0–5.0)
HCT: 34.4 % — ABNORMAL LOW (ref 36.0–46.0)
Hemoglobin: 11.5 g/dL — ABNORMAL LOW (ref 12.0–15.0)
Lymphocytes Relative: 32.8 % (ref 12.0–46.0)
Lymphs Abs: 2.6 10*3/uL (ref 0.7–4.0)
MCHC: 33.4 g/dL (ref 30.0–36.0)
MCV: 82.5 fl (ref 78.0–100.0)
Monocytes Absolute: 0.6 10*3/uL (ref 0.1–1.0)
Monocytes Relative: 7.3 % (ref 3.0–12.0)
Neutro Abs: 4.6 10*3/uL (ref 1.4–7.7)
Neutrophils Relative %: 57.5 % (ref 43.0–77.0)
Platelets: 353 10*3/uL (ref 150.0–400.0)
RBC: 4.17 Mil/uL (ref 3.87–5.11)
RDW: 14.8 % (ref 11.5–15.5)
WBC: 7.9 10*3/uL (ref 4.0–10.5)

## 2021-04-15 MED ORDER — PREDNISONE 20 MG PO TABS: 20.0000 mg | ORAL_TABLET | Freq: Every day | ORAL | 0 refills | Status: DC

## 2021-04-15 MED ORDER — AZITHROMYCIN 250 MG PO TABS: ORAL_TABLET | ORAL | 0 refills | Status: AC

## 2021-04-15 MED ORDER — HYDROCODONE-ACETAMINOPHEN 5-325 MG PO TABS: 1.0000 | ORAL_TABLET | Freq: Four times a day (QID) | ORAL | 0 refills | Status: DC | PRN

## 2021-04-15 NOTE — Progress Notes (Signed)
? ?Established Patient Office Visit ? ?Subjective:  ?Patient ID: Casey [name], female    DOB: 1935/10/14  Age: 86 y.o. MRN: [id number] ? ?CC:  ?Chief Complaint  ?Patient presents with  ? Follow-up  ?  Patient states she is here for 6 month follow up on Diabetes.  ? ? ?HPI ?Casey [name] presents for t2dm ? ?Last A1c:  ?Lab Results  ?Component Value Date  ? HGBA1C 7.0 (A) 04/15/2021  ?  ?Currently taking: metformin 500mg  po bid ac ?No new complications ?Reports good compliance with medications ?Diet has been steady ?Exercise habits have been steady ? ?Htn ?Hypertension: ?Patient Currently taking: furosemide 40mg  po qd, amlodipine 10mg  po qd, lisinopril 40mg  po qd ?Good effect. No AEs. ?Denies CV symptoms including: chest pain, shob, doe, headache, visual changes, fatigue, claudication, and dependent edema.  ? ?Previous readings and labs: ?BP Readings from Last 3 Encounters:  ?04/15/21 105/61  ?03/26/21 (!) 156/74  ?03/07/21 (!) 154/75  ? ?Lab Results  ?Component Value Date  ? CREATININE 0.83 02/21/2021  ? ? ?Nasal congestion ?Ongoing x 3-4 weeks ?Some pnd, cough, diarrhea ?Denies chest pain, shob, doe, headaches, visual changes. ?Notes some ear pressure ?No sick contacts ?Has not tried treatment ?Weight and appetite steady. ? ?Of note -  ?Had been seen by Dr. Gala Romney for prolapsed uterus ?Will have pessary fitting upcoming. ? ? ?Outpatient Medications Prior to Visit  ?Medication Sig Dispense Refill  ? ALPRAZolam (XANAX) 0.25 MG tablet Take 0.5 tablets (0.125 mg total) by mouth at bedtime as needed for anxiety. 30 tablet 0  ? amLODipine (NORVASC) 10 MG tablet Take 1 tablet (10 mg total) by mouth daily. 90 tablet 1  ? aspirin 81 MG chewable tablet Chew 1 tablet (81 mg total) by mouth 2 (two) times daily.    ? atorvastatin (LIPITOR) 10 MG tablet Take 1 tablet (10 mg total) by mouth daily. 90 tablet 3  ? azelastine (ASTELIN) 0.1 % nasal spray Place 1 spray into both nostrils 2 (two) times daily. Use in each  nostril as directed 30 mL 12  ? buPROPion (WELLBUTRIN XL) 150 MG 24 hr tablet Take 1 tablet (150 mg total) by mouth daily. 90 tablet 1  ? Carboxymethylcellul-Glycerin (REFRESH OPTIVE) 1-0.9 % GEL Place 1 drop into both eyes daily.    ? donepezil (ARICEPT) 10 MG tablet TAKE 1 TABLET(5 MG) BY MOUTH AT BEDTIME 90 tablet 5  ? furosemide (LASIX) 40 MG tablet Take 1 tablet (40 mg total) by mouth daily. 90 tablet 1  ? HYDROcodone-acetaminophen (NORCO/VICODIN) 5-325 MG tablet Take 1 tablet by mouth every 6 (six) hours as needed for moderate pain. For chronic pain 90 tablet 0  ? hydrOXYzine (ATARAX) 10 MG tablet Take 0.5-1 tablet up to three times needed daily for anxiety, or to help sleep. 90 tablet 1  ? lisinopril (ZESTRIL) 40 MG tablet TAKE 1 TABLET(40 MG) BY MOUTH DAILY 90 tablet 1  ? meclizine (ANTIVERT) 50 MG tablet Take 1 tablet (50 mg total) by mouth 3 (three) times daily as needed. 30 tablet 0  ? metFORMIN (GLUCOPHAGE) 500 MG tablet TAKE 1 TABLET(500 MG) BY MOUTH TWICE DAILY WITH A MEAL 180 tablet 1  ? omeprazole (PRILOSEC) 40 MG capsule TAKE 1 CAPSULE(40 MG) BY MOUTH DAILY 90 capsule 3  ? traZODone (DESYREL) 100 MG tablet Take 1 tablet (100 mg total) by mouth at bedtime. 90 tablet 1  ? ?No facility-administered medications prior to visit.  ? ? ?Review of Systems ?Per  hpi  ? ?  ?Objective:  ?  ? ?BP 105/61   Pulse 74   Temp 97.8 ?F (36.6 ?C) (Temporal)   Resp 18   Ht 5\' 2"  (1.575 m)   Wt 174 lb 9.6 oz (79.2 kg)   SpO2 100%   BMI 31.93 kg/m?  ? ?Wt Readings from Last 3 Encounters:  ?04/15/21 174 lb 9.6 oz (79.2 kg)  ?03/26/21 170 lb (77.1 kg)  ?03/07/21 178 lb (80.7 kg)  ? ?Physical Exam ?Vitals and nursing note reviewed.  ?Constitutional:   ?   General: She is not in acute distress. ?   Appearance: Normal appearance. She is normal weight. She is not ill-appearing, toxic-appearing or diaphoretic.  ?HENT:  ?   Nose: Congestion and rhinorrhea present.  ?   Mouth/Throat:  ?   Pharynx: No oropharyngeal exudate or  posterior oropharyngeal erythema.  ?Cardiovascular:  ?   Rate and Rhythm: Normal rate and regular rhythm.  ?   Heart sounds: Normal heart sounds. No murmur heard. ?  No friction rub. No gallop.  ?Pulmonary:  ?   Effort: Pulmonary effort is normal. No respiratory distress.  ?   Breath sounds: Normal breath sounds. No stridor. No wheezing, rhonchi or rales.  ?Chest:  ?   Chest wall: No tenderness.  ?Skin: ?   General: Skin is warm and dry.  ?Neurological:  ?   General: No focal deficit present.  ?   Mental Status: She is alert and oriented to person, place, and time. Mental status is at baseline.  ?Psychiatric:     ?   Mood and Affect: Mood normal.     ?   Behavior: Behavior normal.     ?   Thought Content: Thought content normal.     ?   Judgment: Judgment normal.  ? ? ?Results for orders placed or performed in visit on 04/15/21  ?POCT glycosylated hemoglobin (Hb A1C)  ?Result Value Ref Range  ? Hemoglobin A1C 7.0 (A) 4.0 - 5.6 %  ? HbA1c POC (<> result, manual entry)    ? HbA1c, POC (prediabetic range)    ? HbA1c, POC (controlled diabetic range)    ? ? ? ? ?The ASCVD Risk score (Arnett DK, et al., 2019) failed to calculate for the following reasons: ?  The 2019 ASCVD risk score is only valid for ages 64 to 44 ? ?  ?Assessment & Plan:  ? ?Problem List Items Addressed This Visit   ? ?  ? Cardiovascular and Mediastinum  ? HYPERTENSION, BENIGN ESSENTIAL (Chronic)  ?  Permissive HTN. Will continue to monitor. Labs today. Refill meds.  ?  ?  ?  ? Endocrine  ? Type 2 diabetes mellitus with microalbuminuria, without long-term current use of insulin (HCC) - Primary (Chronic)  ?  Stable with A1c 7.0 today. Continue current meds.  ?  ?  ? Relevant Orders  ? POCT glycosylated hemoglobin (Hb A1C) (Completed)  ? Comprehensive metabolic panel  ? CBC with Differential/Platelet  ? TSH  ? Lipid panel  ? Urinalysis, Routine w reflex microscopic  ? ?Other Visit Diagnoses   ? ? Nasal congestion      ? Relevant Medications  ?  azithromycin (ZITHROMAX) 250 MG tablet  ? predniSONE (DELTASONE) 20 MG tablet  ? Other Relevant Orders  ? Comprehensive metabolic panel  ? CBC with Differential/Platelet  ? TSH  ? Lipid panel  ? Urinalysis, Routine w reflex microscopic  ? Diarrhea, unspecified type      ?  Relevant Orders  ? Comprehensive metabolic panel  ? CBC with Differential/Platelet  ? TSH  ? Lipid panel  ? Urinalysis, Routine w reflex microscopic  ? ?  ? ? ?Meds ordered this encounter  ?Medications  ? azithromycin (ZITHROMAX) 250 MG tablet  ?  Sig: Take 2 tablets on day 1, then 1 tablet daily on days 2 through 5  ?  Dispense:  6 tablet  ?  Refill:  0  ?  Order Specific Question:   Supervising Provider  ?  Answer:   Carlota Raspberry, JEFFREY R [[id number]]  ? predniSONE (DELTASONE) 20 MG tablet  ?  Sig: Take 1 tablet (20 mg total) by mouth daily with breakfast.  ?  Dispense:  5 tablet  ?  Refill:  0  ?  Order Specific Question:   Supervising Provider  ?  Answer:   Carlota Raspberry, JEFFREY R [[id number]]  ? ? ?Return in about 6 months (around 10/16/2021) for Chronic Conditions.  ? ?PLAN ?Z pack and prednisone for acute infection. Will call on Friday to check on pt and diarrhea. ?Labs today as above. ?Patient encouraged to call clinic with any questions, comments, or concerns. ? ?Maximiano Coss, NP ?

## 2021-04-15 NOTE — Telephone Encounter (Signed)
Patient called in stating the pharmacy does not have the Hydrocodone. Asking if we can send this in.  ?

## 2021-04-15 NOTE — Patient Instructions (Addendum)
Ms. Nellums ? ?Great to see you ? ?Call if things get worse ? ?I have refilled all medications ? ?See you in 6 mo, sooner if concerns arise ? ?Thank you ? ?Rich  ? ? ? ?If you have lab work done today you will be contacted with your lab results within the next 2 weeks.  If you have not heard from [name] then please contact us. The fastest way to get your results is to register for My Chart. ? ? ?IF you received an x-ray today, you will receive an invoice from [facility]. Please contact [facility] at [phone] with questions or concerns regarding your invoice.  ? ?IF you received labwork today, you will receive an invoice from [facility]. Please contact [facility] at [phone] with questions or concerns regarding your invoice.  ? ?Our billing staff will not be able to assist you with questions regarding bills from these companies. ? ?You will be contacted with the lab results as soon as they are available. The fastest way to get your results is to activate your My Chart account. Instructions are located on the last page of this paperwork. If you have not heard from [name] regarding the results in 2 weeks, please contact this office. ?  ? ? ?

## 2021-04-15 NOTE — Assessment & Plan Note (Signed)
Stable with A1c 7.0 today. Continue current meds.  ?

## 2021-04-15 NOTE — Assessment & Plan Note (Signed)
Permissive HTN. Will continue to monitor. Labs today. Refill meds.  ?

## 2021-04-15 NOTE — Telephone Encounter (Signed)
Patient is requesting a refill of the following medications: ?Requested Prescriptions  ? ?Pending Prescriptions Disp Refills  ? HYDROcodone-acetaminophen (NORCO/VICODIN) 5-325 MG tablet 90 tablet 0  ?  Sig: Take 1 tablet by mouth every 6 (six) hours as needed for moderate pain. For chronic pain  ? ? ?Date of patient request: 04/15/2021 ?Last office visit: 04/15/2021 ?Date of last refill: 03/14/2021 ?Last refill amount: 90 tablets ?Follow up time period per chart: 08/19/2021  ?

## 2021-04-16 ENCOUNTER — Other Ambulatory Visit: Payer: Self-pay | Admitting: Registered Nurse

## 2021-04-16 DIAGNOSIS — N1831 Chronic kidney disease, stage 3a: Secondary | ICD-10-CM

## 2021-04-16 MED ORDER — EMPAGLIFLOZIN 10 MG PO TABS: 10.0000 mg | ORAL_TABLET | Freq: Every day | ORAL | 0 refills | Status: DC

## 2021-04-17 ENCOUNTER — Telehealth: Payer: Self-pay

## 2021-04-17 ENCOUNTER — Other Ambulatory Visit: Payer: Self-pay | Admitting: Registered Nurse

## 2021-04-17 DIAGNOSIS — K529 Noninfective gastroenteritis and colitis, unspecified: Secondary | ICD-10-CM

## 2021-04-17 MED ORDER — LOPERAMIDE HCL 2 MG PO TABS: 2.0000 mg | ORAL_TABLET | Freq: Two times a day (BID) | ORAL | 0 refills | Status: DC | PRN

## 2021-04-17 NOTE — Telephone Encounter (Signed)
Have sent imodium to use once or twice daily as needed. If she finds she needs this most days or every day after 1-2 weeks, we should get her in with GI ? ?Thanks, ? ?Rich

## 2021-04-17 NOTE — Telephone Encounter (Signed)
-----   Message from [name], NP sent at 04/16/2021  7:26 AM EDT ----- ?If we could call [name] -  ? ?Labs are back. Results ok - but kidney function is down a little bit. She should avoid ibuprofen and other NSAIDs in favor of tylenol if she needs it.  ?I would like her to stop metformin and start jardiance. Follow up with me in 3 mo rather than 6. ? ?Thanks, ? ?Rich  ?

## 2021-04-18 NOTE — Telephone Encounter (Signed)
Called LM to return call to the office to discuss note from [name], NP ?

## 2021-04-18 NOTE — Telephone Encounter (Signed)
Patient returned call, gave message below. Patient understood and had no questions.  ?

## 2021-04-28 ENCOUNTER — Other Ambulatory Visit: Payer: Self-pay

## 2021-04-28 DIAGNOSIS — F411 Generalized anxiety disorder: Secondary | ICD-10-CM

## 2021-04-28 NOTE — Telephone Encounter (Signed)
Patient is requesting a refill of the following medications: ?Requested Prescriptions  ? ?Pending Prescriptions Disp Refills  ? ALPRAZolam (XANAX) 0.25 MG tablet 30 tablet 0  ?  Sig: Take 0.5 tablets (0.125 mg total) by mouth at bedtime as needed for anxiety.  ? ? ?Date of patient request: 04/28/21 ?Last office visit: 04/15/21 ?Date of last refill: 03/27/21 ?Last refill amount: 30 ? ?

## 2021-04-29 MED ORDER — ALPRAZOLAM 0.25 MG PO TABS: 0.1250 mg | ORAL_TABLET | Freq: Every evening | ORAL | 0 refills | Status: DC | PRN

## 2021-04-30 ENCOUNTER — Telehealth: Payer: Medicare Other

## 2021-05-13 ENCOUNTER — Encounter: Payer: Self-pay | Admitting: Podiatry

## 2021-05-13 ENCOUNTER — Ambulatory Visit (INDEPENDENT_AMBULATORY_CARE_PROVIDER_SITE_OTHER): Payer: Medicare Other | Admitting: Podiatry

## 2021-05-13 DIAGNOSIS — B351 Tinea unguium: Secondary | ICD-10-CM | POA: Diagnosis not present

## 2021-05-13 DIAGNOSIS — M2141 Flat foot [pes planus] (acquired), right foot: Secondary | ICD-10-CM | POA: Diagnosis not present

## 2021-05-13 DIAGNOSIS — E119 Type 2 diabetes mellitus without complications: Secondary | ICD-10-CM

## 2021-05-13 DIAGNOSIS — R600 Localized edema: Secondary | ICD-10-CM

## 2021-05-13 DIAGNOSIS — M79675 Pain in left toe(s): Secondary | ICD-10-CM

## 2021-05-13 DIAGNOSIS — M2142 Flat foot [pes planus] (acquired), left foot: Secondary | ICD-10-CM

## 2021-05-13 DIAGNOSIS — E1142 Type 2 diabetes mellitus with diabetic polyneuropathy: Secondary | ICD-10-CM | POA: Diagnosis not present

## 2021-05-13 DIAGNOSIS — M79674 Pain in right toe(s): Secondary | ICD-10-CM | POA: Diagnosis not present

## 2021-05-13 NOTE — Progress Notes (Signed)
ANNUAL DIABETIC FOOT EXAM ? ?Subjective: ?Casey [name] presents today for annual diabetic foot examination. ? ?Patient relates 3 year h/o diabetes. ? ?Patient denies any h/o foot wounds. ? ?Patient admits symptoms of foot numbness on occasion. ? ?Last HgA1c was 7.0%. Patient did not check blood glucose this morning. ? ?Risk factors: diabetes, neuropathy, HTN, hypercholesterolemia. ? ?Casey Agee, NP is patient's PCP. Last visit was April 15, 2021. ? ?Past Medical History:  ?Diagnosis Date  ? ALLERGIC RHINITIS   ? Allergy   ? ANXIETY   ? ARM PAIN, RIGHT   ? BENIGN POSITIONAL VERTIGO   ? Carcinoma in situ of colon   ? CONSTIPATION   ? CYSTOCELE WITH INCOMPLETE UTERINE PROLAPSE   ? Diabetes mellitus without complication (HCC)   ? ESOPHAGEAL STRICTURE   ? FATIGUE   ? FLANK PAIN, LEFT   ? Headache(784.0)   ? HIP PAIN, LEFT   ? HYPERCHOLESTEROLEMIA   ? HYPERGLYCEMIA   ? HYPERTENSION, BENIGN ESSENTIAL 03/03/2007  ? LEG EDEMA, BILATERAL   ? Microscopic hematuria   ? OBESITY   ? OSTEOARTHRITIS, KNEE   ? OSTEOPOROSIS   ? Peripheral neuropathy, idiopathic   ? Problems with swallowing and mastication   ? SINUSITIS, RECURRENT   ? SPRAIN&STRAIN OTH SPEC SITES SHOULDER&UPPER ARM   ? ?Patient Active Problem List  ? Diagnosis Date Noted  ? Osteoarthritis of right knee 07/24/2020  ? Cognitive deficit with impaired visuospatial function 01/03/2020  ? Memory impairment of gradual onset 01/03/2020  ? Anxiety about health 01/03/2020  ? Dementia without behavioral disturbance (HCC) 01/03/2020  ? Gastroesophageal reflux disease without esophagitis 07/17/2019  ? Preoperative cardiovascular examination 05/13/2018  ? Vitamin B12 deficiency 07/14/2017  ? Primary osteoarthritis of left knee 07/07/2013  ? Type 2 diabetes mellitus with microalbuminuria, without long-term current use of insulin (HCC) 05/26/2012  ? Anemia 10/23/2011  ? Encounter for medication monitoring 10/23/2011  ? Hyponatremia 12/01/2010  ?  Class: Acute  ? Chest pain  with moderate risk for cardiac etiology 11/26/2010  ?  Class: Acute  ? CYSTOCELE WITH INCOMPLETE UTERINE PROLAPSE 03/10/2010  ? HYPERGLYCEMIA 02/06/2010  ? ESOPHAGEAL STRICTURE 12/11/2009  ? Obesity 12/09/2009  ? PROBLEMS WITH SWALLOWING AND MASTICATION 12/09/2009  ? MICROSCOPIC HEMATURIA 07/29/2009  ? ARM PAIN, RIGHT 03/28/2009  ? Fatigue 03/28/2009  ? CONSTIPATION 09/25/2008  ? Sinusitis, chronic 02/23/2008  ? Osteoarthrosis involving lower leg 09/28/2007  ? LEG EDEMA, BILATERAL 08/31/2007  ? FLANK PAIN, LEFT 06/08/2007  ? SPRAIN&STRAIN OTH SPEC SITES SHOULDER&UPPER ARM 06/08/2007  ? BENIGN POSITIONAL VERTIGO 04/25/2007  ? Headache 03/24/2007  ? HYPERCHOLESTEROLEMIA 03/03/2007  ? GAD (generalized anxiety disorder) 03/03/2007  ? HYPERTENSION, BENIGN ESSENTIAL 03/03/2007  ? Allergic rhinitis 03/03/2007  ? HIP PAIN, LEFT 03/03/2007  ? Osteoporosis 03/03/2007  ? ?Past Surgical History:  ?Procedure Laterality Date  ? COLONOSCOPY    ? KNEE ARTHROPLASTY Right 07/24/2020  ? Procedure: COMPUTER ASSISTED TOTAL KNEE ARTHROPLASTY;  Surgeon: Samson Frederic, MD;  Location: [facility];  Service: Orthopedics;  Laterality: Right;  ? LEFT HEART CATHETERIZATION WITH CORONARY ANGIOGRAM N/A 12/02/2010  ? Procedure: LEFT HEART CATHETERIZATION WITH CORONARY ANGIOGRAM;  Surgeon: Peter M Swaziland, MD;  Location: [facility] CATH [facility];  Service: Cardiovascular;:: NORMAL CORONARY ARTERIES - NORMAL LV FUNCTION & EDP.    ? TRANSTHORACIC ECHOCARDIOGRAM  11/2010  ? Normal EF 60-65%. No RWMA.  Mild LAD dilation c/w Gr 1 DD.  -- PRETTY NORMAL ECHO  ? ?Current Outpatient Medications on File Prior to Visit  ?  Medication Sig Dispense Refill  ? ALPRAZolam (XANAX) 0.25 MG tablet Take 0.5 tablets (0.125 mg total) by mouth at bedtime as needed for anxiety. 30 tablet 0  ? amLODipine (NORVASC) 10 MG tablet Take 1 tablet (10 mg total) by mouth daily. 90 tablet 1  ? aspirin 81 MG chewable tablet Chew 1 tablet (81 mg total) by mouth 2 (two) times daily.    ? atorvastatin  (LIPITOR) 10 MG tablet Take 1 tablet (10 mg total) by mouth daily. 90 tablet 3  ? azelastine (ASTELIN) 0.1 % nasal spray Place 1 spray into both nostrils 2 (two) times daily. Use in each nostril as directed 30 mL 12  ? buPROPion (WELLBUTRIN XL) 150 MG 24 hr tablet Take 1 tablet (150 mg total) by mouth daily. 90 tablet 1  ? Carboxymethylcellul-Glycerin (REFRESH OPTIVE) 1-0.9 % GEL Place 1 drop into both eyes daily.    ? donepezil (ARICEPT) 10 MG tablet TAKE 1 TABLET(5 MG) BY MOUTH AT BEDTIME 90 tablet 5  ? empagliflozin (JARDIANCE) 10 MG TABS tablet Take 1 tablet (10 mg total) by mouth daily before breakfast. 90 tablet 0  ? furosemide (LASIX) 40 MG tablet Take 1 tablet (40 mg total) by mouth daily. 90 tablet 1  ? HYDROcodone-acetaminophen (NORCO/VICODIN) 5-325 MG tablet Take 1 tablet by mouth every 6 (six) hours as needed for moderate pain. For chronic pain 90 tablet 0  ? hydrOXYzine (ATARAX) 10 MG tablet Take 0.5-1 tablet up to three times needed daily for anxiety, or to help sleep. 90 tablet 1  ? lisinopril (ZESTRIL) 40 MG tablet TAKE 1 TABLET(40 MG) BY MOUTH DAILY 90 tablet 1  ? loperamide (IMODIUM A-D) 2 MG tablet Take 1 tablet (2 mg total) by mouth 2 (two) times daily as needed for diarrhea or loose stools. 30 tablet 0  ? meclizine (ANTIVERT) 50 MG tablet Take 1 tablet (50 mg total) by mouth 3 (three) times daily as needed. 30 tablet 0  ? omeprazole (PRILOSEC) 40 MG capsule TAKE 1 CAPSULE(40 MG) BY MOUTH DAILY 90 capsule 3  ? predniSONE (DELTASONE) 20 MG tablet Take 1 tablet (20 mg total) by mouth daily with breakfast. 5 tablet 0  ? traZODone (DESYREL) 100 MG tablet Take 1 tablet (100 mg total) by mouth at bedtime. 90 tablet 1  ? ?No current facility-administered medications on file prior to visit.  ?  ?No Known Allergies ?Social History  ? ?Occupational History  ? Not on file  ?Tobacco Use  ? Smoking status: Never  ? Smokeless tobacco: Never  ?Vaping Use  ? Vaping Use: Never used  ?Substance and Sexual Activity   ? Alcohol use: No  ? Drug use: No  ? Sexual activity: Not Currently  ? ?Family History  ?Problem Relation Age of Onset  ? Coronary artery disease Mother [id number]  ? Cancer Father [id number]  ? Heart disease Father   ? Colon cancer Other   ? Diabetes Other   ? Coronary artery disease Sister   ? Diabetes type II Sister   ? Cancer Sister   ? Coronary artery disease Brother   ? Kidney disease Brother   ? Coronary artery disease Brother   ? Breast cancer Neg Hx   ? ?Immunization History  ?Administered Date(s) Administered  ? Fluad Quad(high Dose 65+) 10/16/2019, 10/16/2020  ? Influenza Split 10/20/2013  ? Influenza Whole 01/10/2008, 12/28/2008, 12/02/2009  ? Influenza, High Dose Seasonal PF 10/16/2018  ? Influenza, Seasonal, Injecte, Preservative Fre 11/23/2012  ? Influenza,inj,Quad PF,6+ Mos  12/24/2015, 09/16/2016  ? Influenza-Unspecified 10/09/2011, 10/19/2014, 09/23/2017  ? PFIZER(Purple Top)SARS-COV-2 Vaccination 03/14/2019, 04/04/2019, 12/25/2019  ? Pneumococcal Conjugate-13 10/20/2013  ? Pneumococcal Polysaccharide-23 03/24/2007  ? Td 09/28/2007  ? Zoster Recombinat (Shingrix) 01/21/2018  ?  ? ?Review of Systems: Negative except as noted in the HPI.  ? ?Objective: ?There were no vitals filed for this visit. ? ?RIAH KEHOE is a pleasant 86 y.o. female in NAD. AAO X 3. ? ?Vascular Examination: ?CFT <3 seconds b/l LE. Faintly palpable DP pulses b/l LE. Diminished PT pulse(s) b/l LE. Nonpitting edema noted bilateral ankles. No cyanosis or clubbing noted b/l LE. ? ?Dermatological Examination: ?Pedal skin is warm and supple b/l LE. No open wounds b/l LE. No interdigital macerations noted b/l LE. Toenails 1-5 bilaterally elongated, discolored, dystrophic, thickened, and crumbly with subungual debris and tenderness to dorsal palpation. No hyperkeratotic nor porokeratotic lesions present on today's visit. ? ?Neurological Examination: ?Pt has subjective symptoms of neuropathy. Protective sensation intact 5/5 intact bilaterally with  10g monofilament b/l. Vibratory sensation intact b/l. ? ?Musculoskeletal Examination: ?Normal muscle strength 5/5 to all lower extremity muscle groups bilaterally. Pes planus deformity noted bilateral LE.Marland Kitchen N

## 2021-05-13 NOTE — Patient Instructions (Signed)
It was my pleasure serving you today! You had your annual diabetic foot examination performed today. You will notice a charge for an office visit on today's billing and this is for your annual diabetic foot exam. It is done once yearly.  ? ?Diabetes Mellitus and Foot Care ?Foot care is an important part of your health, especially when you have diabetes. Diabetes may cause you to have problems because of poor blood flow (circulation) to your feet and legs, which can cause your skin to: ?Become thinner and drier. ?Break more easily. ?Heal more slowly. ?Peel and crack. ?You may also have nerve damage (neuropathy) in your legs and feet, causing decreased feeling in them. This means that you may not notice minor injuries to your feet that could lead to more serious problems. Noticing and addressing any potential problems early is the best way to prevent future foot problems. ?How to care for your feet ?Foot hygiene ? ?Wash your feet daily with warm water and mild soap. Do not use hot water. Then, pat your feet and the areas between your toes until they are completely dry. Do not soak your feet as this can dry your skin. ?Trim your toenails straight across. Do not dig under them or around the cuticle. File the edges of your nails with an emery board or nail file. ?Apply a moisturizing lotion or petroleum jelly to the skin on your feet and to dry, brittle toenails. Use lotion that does not contain alcohol and is unscented. Do not apply lotion between your toes. ?Shoes and socks ?Wear clean socks or stockings every day. Make sure they are not too tight. Do not wear knee-high stockings since they may decrease blood flow to your legs. ?Wear shoes that fit properly and have enough cushioning. Always look in your shoes before you put them on to be sure there are no objects inside. ?To break in new shoes, wear them for just a few hours a day. This prevents injuries on your feet. ?Wounds, scrapes, corns, and calluses ? ?Check  your feet daily for blisters, cuts, bruises, sores, and redness. If you cannot see the bottom of your feet, use a mirror or ask someone for help. ?Do not cut corns or calluses or try to remove them with medicine. ?If you find a minor scrape, cut, or break in the skin on your feet, keep it and the skin around it clean and dry. You may clean these areas with mild soap and water. Do not clean the area with peroxide, alcohol, or iodine. ?If you have a wound, scrape, corn, or callus on your foot, look at it several times a day to make sure it is healing and not infected. Check for: ?Redness, swelling, or pain. ?Fluid or blood. ?Warmth. ?Pus or a bad smell. ?General tips ?Do not cross your legs. This may decrease blood flow to your feet. ?Do not use heating pads or hot water bottles on your feet. They may burn your skin. If you have lost feeling in your feet or legs, you may not know this is happening until it is too late. ?Protect your feet from hot and cold by wearing shoes, such as at the beach or on hot pavement. ?Schedule a complete foot exam at least once a year (annually) or more often if you have foot problems. Report any cuts, sores, or bruises to your health care provider immediately. ?Where to find more information ?American Diabetes Association: www.diabetes.org ?Association of Diabetes Care & Education Specialists: www.diabeteseducator.org ?Contact   a health care provider if: ?You have a medical condition that increases your risk of infection and you have any cuts, sores, or bruises on your feet. ?You have an injury that is not healing. ?You have redness on your legs or feet. ?You feel burning or tingling in your legs or feet. ?You have pain or cramps in your legs and feet. ?Your legs or feet are numb. ?Your feet always feel cold. ?You have pain around any toenails. ?Get help right away if: ?You have a wound, scrape, corn, or callus on your foot and: ?You have pain, swelling, or redness that gets worse. ?You  have fluid or blood coming from the wound, scrape, corn, or callus. ?Your wound, scrape, corn, or callus feels warm to the touch. ?You have pus or a bad smell coming from the wound, scrape, corn, or callus. ?You have a fever. ?You have a red line going up your leg. ?Summary ?Check your feet every day for blisters, cuts, bruises, sores, and redness. ?Apply a moisturizing lotion or petroleum jelly to the skin on your feet and to dry, brittle toenails. ?Wear shoes that fit properly and have enough cushioning. ?If you have foot problems, report any cuts, sores, or bruises to your health care provider immediately. ?Schedule a complete foot exam at least once a year (annually) or more often if you have foot problems. ?This information is not intended to replace advice given to you by your health care provider. Make sure you discuss any questions you have with your health care provider. ?Document Revised: 07/27/2019 Document Reviewed: 07/27/2019 ?Elsevier Patient Education ? 2023 [employer] ? ?

## 2021-05-19 ENCOUNTER — Telehealth: Payer: Self-pay

## 2021-05-19 DIAGNOSIS — M170 Bilateral primary osteoarthritis of knee: Secondary | ICD-10-CM

## 2021-05-19 MED ORDER — HYDROCODONE-ACETAMINOPHEN 5-325 MG PO TABS: 1.0000 | ORAL_TABLET | Freq: Four times a day (QID) | ORAL | 0 refills | Status: DC | PRN

## 2021-05-19 NOTE — Telephone Encounter (Signed)
Patient is requesting a refill of the following medications: ?Requested Prescriptions  ? ?Pending Prescriptions Disp Refills  ? HYDROcodone-acetaminophen (NORCO/VICODIN) 5-325 MG tablet 90 tablet 0  ?  Sig: Take 1 tablet by mouth every 6 (six) hours as needed for moderate pain. For chronic pain  ? ? ?Date of patient request: 05/19/21 ?Last office visit: 04/15/21 ?Date of last refill: 04/15/21 ?Last refill amount: 90 ? ? ?

## 2021-05-19 NOTE — Telephone Encounter (Signed)
MEDICATION:HYDROcodone-acetaminophen (NORCO/VICODIN) 5-325 MG tablet ? ?PHARMACY: [facility] [phone] - [place], [address] ? ?Comments: Patient is completely out.  ? ?**Let patient know to contact pharmacy at the end of the day to make sure medication is ready. ** ? ?** Please notify patient to allow 48-72 hours to process** ? ?**Encourage patient to contact the pharmacy for refills or they can request refills through [facility]** ? ? ?

## 2021-05-19 NOTE — Progress Notes (Signed)
GYNECOLOGY  VISIT ?  ?HPI: ?86 y.o.   Single  African American  female   ?G1P1 with No LMP recorded. Patient is postmenopausal.   ?here for vaginal prolapse and possible pessary fitting.  ? ?Patient has second degree uterine prolapse.  ?Difficulty to have a BM.  ?Able to void.  ? ?GYNECOLOGIC HISTORY: ?No LMP recorded. Patient is postmenopausal. ?Contraception:  PMP ?Menopausal hormone therapy:  None ?Last mammogram:   11-14-20 Neg/BiRads1 ?Last pap smear: 08-10-12 Neg  ?       ?OB History   ? ? Gravida  ?1  ? Para  ?1  ? Term  ?   ? Preterm  ?   ? AB  ?   ? Living  ?1  ?  ? ? SAB  ?   ? IAB  ?   ? Ectopic  ?   ? Multiple  ?   ? Live Births  ?   ?   ?  ?  ?    ? ?Patient Active Problem List  ? Diagnosis Date Noted  ? Osteoarthritis of right knee 07/24/2020  ? Cognitive deficit with impaired visuospatial function 01/03/2020  ? Memory impairment of gradual onset 01/03/2020  ? Anxiety about health 01/03/2020  ? Dementia without behavioral disturbance ([place]) 01/03/2020  ? Gastroesophageal reflux disease without esophagitis 07/17/2019  ? Preoperative cardiovascular examination 05/13/2018  ? Vitamin B12 deficiency 07/14/2017  ? Primary osteoarthritis of left knee 07/07/2013  ? Type 2 diabetes mellitus with microalbuminuria, without long-term current use of insulin ([place]) 05/26/2012  ? Anemia 10/23/2011  ? Encounter for medication monitoring 10/23/2011  ? Hyponatremia 12/01/2010  ?  Class: Acute  ? Chest pain with moderate risk for cardiac etiology 11/26/2010  ?  Class: Acute  ? CYSTOCELE WITH INCOMPLETE UTERINE PROLAPSE 03/10/2010  ? HYPERGLYCEMIA 02/06/2010  ? ESOPHAGEAL STRICTURE 12/11/2009  ? Obesity 12/09/2009  ? PROBLEMS WITH SWALLOWING AND MASTICATION 12/09/2009  ? MICROSCOPIC HEMATURIA 07/29/2009  ? ARM PAIN, RIGHT 03/28/2009  ? Fatigue 03/28/2009  ? CONSTIPATION 09/25/2008  ? Sinusitis, chronic 02/23/2008  ? Osteoarthrosis involving lower leg 09/28/2007  ? LEG EDEMA, BILATERAL 08/31/2007  ? FLANK PAIN, LEFT  06/08/2007  ? SPRAIN&STRAIN OTH SPEC SITES SHOULDER&UPPER ARM 06/08/2007  ? BENIGN POSITIONAL VERTIGO 04/25/2007  ? Headache 03/24/2007  ? HYPERCHOLESTEROLEMIA 03/03/2007  ? GAD (generalized anxiety disorder) 03/03/2007  ? HYPERTENSION, BENIGN ESSENTIAL 03/03/2007  ? Allergic rhinitis 03/03/2007  ? HIP PAIN, LEFT 03/03/2007  ? Osteoporosis 03/03/2007  ? ? ?Past Medical History:  ?Diagnosis Date  ? ALLERGIC RHINITIS   ? Allergy   ? ANXIETY   ? ARM PAIN, RIGHT   ? BENIGN POSITIONAL VERTIGO   ? Carcinoma in situ of colon   ? CONSTIPATION   ? CYSTOCELE WITH INCOMPLETE UTERINE PROLAPSE   ? Diabetes mellitus without complication ([place])   ? ESOPHAGEAL STRICTURE   ? FATIGUE   ? FLANK PAIN, LEFT   ? Headache(784.0)   ? HIP PAIN, LEFT   ? HYPERCHOLESTEROLEMIA   ? HYPERGLYCEMIA   ? HYPERTENSION, BENIGN ESSENTIAL 03/03/2007  ? LEG EDEMA, BILATERAL   ? Microscopic hematuria   ? OBESITY   ? OSTEOARTHRITIS, KNEE   ? OSTEOPOROSIS   ? Peripheral neuropathy, idiopathic   ? Problems with swallowing and mastication   ? SINUSITIS, RECURRENT   ? SPRAIN&STRAIN OTH SPEC SITES SHOULDER&UPPER ARM   ? ? ?Past Surgical History:  ?Procedure Laterality Date  ? COLONOSCOPY    ? KNEE ARTHROPLASTY Right 07/24/2020  ?  Procedure: COMPUTER ASSISTED TOTAL KNEE ARTHROPLASTY;  Surgeon: Rod Can, MD;  Location: [facility];  Service: Orthopedics;  Laterality: Right;  ? LEFT HEART CATHETERIZATION WITH CORONARY ANGIOGRAM N/A 12/02/2010  ? Procedure: LEFT HEART CATHETERIZATION WITH CORONARY ANGIOGRAM;  Surgeon: Peter M Martinique, MD;  Location: [facility] CATH LAB;  Service: Cardiovascular;:: NORMAL CORONARY ARTERIES - NORMAL LV FUNCTION & EDP.    ? TRANSTHORACIC ECHOCARDIOGRAM  11/2010  ? Normal EF 60-65%. No RWMA.  Mild LAD dilation c/w Gr 1 DD.  -- PRETTY NORMAL ECHO  ? ? ?Current Outpatient Medications  ?Medication Sig Dispense Refill  ? ALPRAZolam (XANAX) 0.25 MG tablet Take 0.5 tablets (0.125 mg total) by mouth at bedtime as needed for anxiety. 30 tablet 0  ?  amLODipine (NORVASC) 10 MG tablet Take 1 tablet (10 mg total) by mouth daily. 90 tablet 1  ? aspirin 81 MG chewable tablet Chew 1 tablet (81 mg total) by mouth 2 (two) times daily.    ? atorvastatin (LIPITOR) 10 MG tablet Take 1 tablet (10 mg total) by mouth daily. 90 tablet 3  ? azelastine (ASTELIN) 0.1 % nasal spray Place 1 spray into both nostrils 2 (two) times daily. Use in each nostril as directed 30 mL 12  ? buPROPion (WELLBUTRIN XL) 150 MG 24 hr tablet Take 1 tablet (150 mg total) by mouth daily. 90 tablet 1  ? Carboxymethylcellul-Glycerin (REFRESH OPTIVE) 1-0.9 % GEL Place 1 drop into both eyes daily.    ? donepezil (ARICEPT) 10 MG tablet TAKE 1 TABLET(5 MG) BY MOUTH AT BEDTIME 90 tablet 5  ? empagliflozin (JARDIANCE) 10 MG TABS tablet Take 1 tablet (10 mg total) by mouth daily before breakfast. 90 tablet 0  ? furosemide (LASIX) 40 MG tablet Take 1 tablet (40 mg total) by mouth daily. 90 tablet 1  ? HYDROcodone-acetaminophen (NORCO/VICODIN) 5-325 MG tablet Take 1 tablet by mouth every 6 (six) hours as needed for moderate pain. For chronic pain 90 tablet 0  ? hydrOXYzine (ATARAX) 10 MG tablet Take 0.5-1 tablet up to three times needed daily for anxiety, or to help sleep. 90 tablet 1  ? lisinopril (ZESTRIL) 40 MG tablet TAKE 1 TABLET(40 MG) BY MOUTH DAILY 90 tablet 1  ? loperamide (IMODIUM A-D) 2 MG tablet Take 1 tablet (2 mg total) by mouth 2 (two) times daily as needed for diarrhea or loose stools. 30 tablet 0  ? meclizine (ANTIVERT) 50 MG tablet Take 1 tablet (50 mg total) by mouth 3 (three) times daily as needed. 30 tablet 0  ? omeprazole (PRILOSEC) 40 MG capsule TAKE 1 CAPSULE(40 MG) BY MOUTH DAILY 90 capsule 3  ? predniSONE (DELTASONE) 20 MG tablet Take 1 tablet (20 mg total) by mouth daily with breakfast. 5 tablet 0  ? traZODone (DESYREL) 100 MG tablet Take 1 tablet (100 mg total) by mouth at bedtime. 90 tablet 1  ? ?No current facility-administered medications for this visit.  ?  ? ?ALLERGIES: Patient  has no known allergies. ? ?Family History  ?Problem Relation Age of Onset  ? Coronary artery disease Mother 67  ? Cancer Father 48  ? Heart disease Father   ? Colon cancer Other   ? Diabetes Other   ? Coronary artery disease Sister   ? Diabetes type II Sister   ? Cancer Sister   ? Coronary artery disease Brother   ? Kidney disease Brother   ? Coronary artery disease Brother   ? Breast cancer Neg Hx   ? ? ?Social  History  ? ?Socioeconomic History  ? Marital status: Single  ?  Spouse name: Not on file  ? Number of children: 1  ? Years of education: 40  ? Highest education level: High school graduate  ?Occupational History  ? Not on file  ?Tobacco Use  ? Smoking status: Never  ? Smokeless tobacco: Never  ?Vaping Use  ? Vaping Use: Never used  ?Substance and Sexual Activity  ? Alcohol use: No  ? Drug use: No  ? Sexual activity: Not Currently  ?Other Topics Concern  ? Not on file  ?Social History Narrative  ? She tries to stay active, but over the last year (20 19-20 20), she has been very limited by her right knee pain.  ? Is able to go grocery shopping.  ? ?Social Determinants of Health  ? ?Financial Resource Strain: Not on file  ?Food Insecurity: Not on file  ?Transportation Needs: Not on file  ?Physical Activity: Not on file  ?Stress: Not on file  ?Social Connections: Not on file  ?Intimate Partner Violence: Not on file  ? ? ?Review of Systems  ?All other systems reviewed and are negative. ? ?PHYSICAL EXAMINATION:   ? ?BP 130/80 (BP Location: Right Arm, Patient Position: Sitting, Cuff Size: Large)   Pulse 80   Ht 5\' 2"  (1.575 m)   Wt 174 lb (78.9 kg)   BMI 31.83 kg/m?     ?General appearance: alert, cooperative and appears stated age ?  ?Pelvic: External genitalia:  no lesions ?             Urethra:  normal appearing urethra with no masses, tenderness or lesions ?             Bartholins and Skenes: normal    ?             Vagina: normal appearing vagina with normal color and discharge, no lesions ?              Cervix: no lesions.  First degree prolapse without Valsalva.  ?               ?Bimanual Exam:  Uterus:  normal size, contour, position, consistency, mobility, non-tender ?             Adnexa: no mass, fullness, tenderne

## 2021-05-20 ENCOUNTER — Encounter: Payer: Self-pay | Admitting: Obstetrics and Gynecology

## 2021-05-20 ENCOUNTER — Ambulatory Visit (INDEPENDENT_AMBULATORY_CARE_PROVIDER_SITE_OTHER): Payer: Medicare Other | Admitting: Obstetrics and Gynecology

## 2021-05-20 VITALS — BP 130/80 | HR 80 | Ht 62.0 in | Wt 174.0 lb

## 2021-05-20 DIAGNOSIS — N814 Uterovaginal prolapse, unspecified: Secondary | ICD-10-CM | POA: Diagnosis not present

## 2021-05-21 ENCOUNTER — Telehealth: Payer: Self-pay | Admitting: Obstetrics and Gynecology

## 2021-05-21 NOTE — Telephone Encounter (Signed)
Pessary ordered

## 2021-05-21 NOTE — Telephone Encounter (Signed)
Please order a #3 ring with support pessary for my patient.  ? ?Thank you! ?

## 2021-06-02 MED ORDER — HYDROCODONE-ACETAMINOPHEN 5-325 MG PO TABS: 1.0000 | ORAL_TABLET | Freq: Four times a day (QID) | ORAL | 0 refills | Status: DC | PRN

## 2021-06-02 NOTE — Telephone Encounter (Signed)
Please resent rx verified not received  ?

## 2021-06-02 NOTE — Addendum Note (Signed)
Addended by: [name] on: 06/02/2021 05:10 PM ? ? Modules accepted: Orders ? ?

## 2021-06-02 NOTE — Telephone Encounter (Signed)
Patient called in stating they still doesn't have this prescription. Asking if we can refax this for her.  ?

## 2021-06-02 NOTE — Telephone Encounter (Signed)
Sent Thanks Rich

## 2021-06-04 ENCOUNTER — Telehealth: Payer: Self-pay | Admitting: Registered Nurse

## 2021-06-04 DIAGNOSIS — F411 Generalized anxiety disorder: Secondary | ICD-10-CM

## 2021-06-04 NOTE — Telephone Encounter (Signed)
Patient Name Casey [name] ?Patient DOB 03/28/1935 ?Call Type Message Only Information Provided ?Reason for Call Request for General Office Information ?Initial Comment Caller states that her Rx has not been received at the Pharmacy. Please send Rx to the ?[facility] [address] ?

## 2021-06-04 NOTE — Telephone Encounter (Signed)
Patient is requesting a refill of the following medications: ?Requested Prescriptions  ? ?Pending Prescriptions Disp Refills  ? hydrOXYzine (ATARAX) 10 MG tablet 90 tablet 1  ?  Sig: Take 0.5-1 tablet up to three times needed daily for anxiety, or to help sleep.  ? ? ?Date of patient request: 06/04/2021 ?Last office visit: 04/15/2021 ?Date of last refill: 02/21/2021 ?Last refill amount: 90 tablets 1 refill 3 times daily ?Follow up time period per chart: 09/04/2021  ?

## 2021-06-05 MED ORDER — HYDROXYZINE HCL 10 MG PO TABS: 10.0000 mg | ORAL_TABLET | Freq: Three times a day (TID) | ORAL | 1 refills | Status: DC | PRN

## 2021-06-05 NOTE — Telephone Encounter (Signed)
I was unable to change the amount of times patient could take the medication. Just sent how the medication how she normally takes it.

## 2021-06-06 NOTE — Telephone Encounter (Signed)
Spoke with patient.  Advised pessary in office.  OV scheduled for 5/24 at 1:30pm.  Patient agreeable to date and time.  Pessary to Marchelle Folks, CMA  Routing to provider for final review. Patient is agreeable to disposition. Will close encounter.

## 2021-06-10 NOTE — Progress Notes (Unsigned)
GYNECOLOGY  VISIT   HPI: 86 y.o.   Single  African American  female   G1P1 with No LMP recorded. Patient is postmenopausal.   here to receive her pessary .  We fitted a #3 ring with support on 05/20/21.   GYNECOLOGIC HISTORY: No LMP recorded. Patient is postmenopausal. Contraception:  PMP Menopausal hormone therapy:  None Last mammogram:  11-14-20 Neg/BiRads1 Last pap smear:  08-10-12 Neg         OB History     Gravida  1   Para  1   Term      Preterm      AB      Living  1      SAB      IAB      Ectopic      Multiple      Live Births                 Patient Active Problem List   Diagnosis Date Noted   Osteoarthritis of right knee 07/24/2020   Cognitive deficit with impaired visuospatial function 01/03/2020   Memory impairment of gradual onset 01/03/2020   Anxiety about health 01/03/2020   Dementia without behavioral disturbance ([facility]) 01/03/2020   Gastroesophageal reflux disease without esophagitis 07/17/2019   Preoperative cardiovascular examination 05/13/2018   Vitamin B12 deficiency 07/14/2017   Primary osteoarthritis of left knee 07/07/2013   Type 2 diabetes mellitus with microalbuminuria, without long-term current use of insulin ([facility]) 05/26/2012   Anemia 10/23/2011   Encounter for medication monitoring 10/23/2011   Hyponatremia 12/01/2010    Class: Acute   Chest pain with moderate risk for cardiac etiology 11/26/2010    Class: Acute   CYSTOCELE WITH INCOMPLETE UTERINE PROLAPSE 03/10/2010   HYPERGLYCEMIA 02/06/2010   ESOPHAGEAL STRICTURE 12/11/2009   Obesity 12/09/2009   PROBLEMS WITH SWALLOWING AND MASTICATION 12/09/2009   MICROSCOPIC HEMATURIA 07/29/2009   ARM PAIN, RIGHT 03/28/2009   Fatigue 03/28/2009   CONSTIPATION 09/25/2008   Sinusitis, chronic 02/23/2008   Osteoarthrosis involving lower leg 09/28/2007   LEG EDEMA, BILATERAL 08/31/2007   FLANK PAIN, LEFT 06/08/2007   SPRAIN&STRAIN OTH SPEC SITES SHOULDER&UPPER ARM 06/08/2007    BENIGN POSITIONAL VERTIGO 04/25/2007   Headache 03/24/2007   HYPERCHOLESTEROLEMIA 03/03/2007   GAD (generalized anxiety disorder) 03/03/2007   HYPERTENSION, BENIGN ESSENTIAL 03/03/2007   Allergic rhinitis 03/03/2007   HIP PAIN, LEFT 03/03/2007   Osteoporosis 03/03/2007    Past Medical History:  Diagnosis Date   ALLERGIC RHINITIS    Allergy    ANXIETY    ARM PAIN, RIGHT    BENIGN POSITIONAL VERTIGO    Carcinoma in situ of colon    CONSTIPATION    CYSTOCELE WITH INCOMPLETE UTERINE PROLAPSE    Diabetes mellitus without complication ([facility])    ESOPHAGEAL STRICTURE    FATIGUE    FLANK PAIN, LEFT    Headache(784.0)    HIP PAIN, LEFT    HYPERCHOLESTEROLEMIA    HYPERGLYCEMIA    HYPERTENSION, BENIGN ESSENTIAL 03/03/2007   LEG EDEMA, BILATERAL    Microscopic hematuria    OBESITY    OSTEOARTHRITIS, KNEE    OSTEOPOROSIS    Peripheral neuropathy, idiopathic    Problems with swallowing and mastication    SINUSITIS, RECURRENT    SPRAIN&STRAIN OTH SPEC SITES SHOULDER&UPPER ARM     Past Surgical History:  Procedure Laterality Date   COLONOSCOPY     KNEE ARTHROPLASTY Right 07/24/2020   Procedure: COMPUTER ASSISTED TOTAL KNEE ARTHROPLASTY;  Surgeon:  Samson Frederic, MD;  Location: [facility];  Service: Orthopedics;  Laterality: Right;   LEFT HEART CATHETERIZATION WITH CORONARY ANGIOGRAM N/A 12/02/2010   Procedure: LEFT HEART CATHETERIZATION WITH CORONARY ANGIOGRAM;  Surgeon: Peter M [place]az[place], MD;  Location: [facility] CATH LAB;  Service: Cardiovascular;:: NORMAL CORONARY ARTERIES - NORMAL LV FUNCTION & EDP.     TRANSTHORACIC ECHOCARDIOGRAM  11/2010   Normal EF 60-65%. No RWMA.  Mild LAD dilation c/w Gr 1 DD.  -- PRETTY NORMAL ECHO    Current Outpatient Medications  Medication Sig Dispense Refill   ALPRAZolam (XANAX) 0.25 MG tablet Take 0.5 tablets (0.125 mg total) by mouth at bedtime as needed for anxiety. 30 tablet 0   amLODipine (NORVASC) 10 MG tablet Take 1 tablet (10 mg total) by mouth daily. 90  tablet 1   aspirin 81 MG chewable tablet Chew 1 tablet (81 mg total) by mouth 2 (two) times daily.     atorvastatin (LIPITOR) 10 MG tablet Take 1 tablet (10 mg total) by mouth daily. 90 tablet 3   azelastine (ASTELIN) 0.1 % nasal spray Place 1 spray into both nostrils 2 (two) times daily. Use in each nostril as directed 30 mL 12   buPROPion (WELLBUTRIN XL) 150 MG 24 hr tablet Take 1 tablet (150 mg total) by mouth daily. 90 tablet 1   Carboxymethylcellul-Glycerin (REFRESH OPTIVE) 1-0.9 % GEL Place 1 drop into both eyes daily.     donepezil (ARICEPT) 10 MG tablet TAKE 1 TABLET(5 MG) BY MOUTH AT BEDTIME 90 tablet 5   donepezil (ARICEPT) 5 MG tablet Take 5 mg by mouth daily.     empagliflozin (JARDIANCE) 10 MG TABS tablet Take 1 tablet (10 mg total) by mouth daily before breakfast. 90 tablet 0   furosemide (LASIX) 40 MG tablet Take 1 tablet (40 mg total) by mouth daily. 90 tablet 1   HYDROcodone-acetaminophen (NORCO/VICODIN) 5-325 MG tablet Take 1 tablet by mouth every 6 (six) hours as needed for moderate pain. For chronic pain 90 tablet 0   hydrOXYzine (ATARAX) 10 MG tablet Take 1 tablet (10 mg total) by mouth 3 (three) times daily as needed. 90 tablet 1   lisinopril (ZESTRIL) 40 MG tablet TAKE 1 TABLET(40 MG) BY MOUTH DAILY 90 tablet 1   loperamide (IMODIUM A-D) 2 MG tablet Take 1 tablet (2 mg total) by mouth 2 (two) times daily as needed for diarrhea or loose stools. 30 tablet 0   meclizine (ANTIVERT) 50 MG tablet Take 1 tablet (50 mg total) by mouth 3 (three) times daily as needed. 30 tablet 0   omeprazole (PRILOSEC) 40 MG capsule TAKE 1 CAPSULE(40 MG) BY MOUTH DAILY 90 capsule 3   predniSONE (DELTASONE) 20 MG tablet Take 1 tablet (20 mg total) by mouth daily with breakfast. 5 tablet 0   traZODone (DESYREL) 100 MG tablet Take 1 tablet (100 mg total) by mouth at bedtime. 90 tablet 1   No current facility-administered medications for this visit.     ALLERGIES: Patient has no known  allergies.  Family History  Problem Relation Age of Onset   Coronary artery disease Mother 42   Cancer Father 59   Heart disease Father    Colon cancer Other    Diabetes Other    Coronary artery disease Sister    Diabetes type II Sister    Cancer Sister    Coronary artery disease Brother    Kidney disease Brother    Coronary artery disease Brother    Breast cancer Neg  Hx     Social History   Socioeconomic History   Marital status: Single    Spouse name: Not on file   Number of children: 1   Years of education: 12   Highest education level: High school graduate  Occupational History   Not on file  Tobacco Use   Smoking status: Never   Smokeless tobacco: Never  Vaping Use   Vaping Use: Never used  Substance and Sexual Activity   Alcohol use: No   Drug use: No   Sexual activity: Not Currently  Other Topics Concern   Not on file  Social History Narrative   She tries to stay active, but over the last year (20 19-20 20), she has been very limited by her right knee pain.   Is able to go grocery shopping.   Social Determinants of Health   Financial Resource Strain: Not on file  Food Insecurity: Not on file  Transportation Needs: Not on file  Physical Activity: Not on file  Stress: Not on file  Social Connections: Not on file  Intimate Partner Violence: Not on file    Review of Systems  All other systems reviewed and are negative.  PHYSICAL EXAMINATION:    BP (!) 156/70   Pulse 67   Ht 5\' 2"  (1.575 m)   Wt 174 lb (78.9 kg)   SpO2 97%   BMI 31.83 kg/m     General appearance: alert, cooperative and appears stated age   #3 ring with support placed and is comfortable.  She is able to walk and toilet and maintain the pessary.  It reduces the prolapse.   Chaperone was present for exam:  , CMA  ASSESSMENT  Uterine prolapse.   PLAN  Patient received Premier #3 ring with support pessary.  Lot Marchelle Folks, expiration 12/19/30.  Pessary care instructions  reviewed with patient. Fu in 2 weeks.    An After Visit Summary was printed and given to the patient.  20 min  total time was spent for this patient encounter, including preparation, face-to-face counseling with the patient, coordination of care, and documentation of the encounter.

## 2021-06-11 ENCOUNTER — Ambulatory Visit (INDEPENDENT_AMBULATORY_CARE_PROVIDER_SITE_OTHER): Payer: Medicare Other | Admitting: Obstetrics and Gynecology

## 2021-06-11 ENCOUNTER — Encounter: Payer: Self-pay | Admitting: Obstetrics and Gynecology

## 2021-06-11 VITALS — BP 156/70 | HR 67 | Ht 62.0 in | Wt 174.0 lb

## 2021-06-11 DIAGNOSIS — Z4689 Encounter for fitting and adjustment of other specified devices: Secondary | ICD-10-CM

## 2021-06-11 DIAGNOSIS — N814 Uterovaginal prolapse, unspecified: Secondary | ICD-10-CM

## 2021-06-11 NOTE — Patient Instructions (Addendum)
PESSARY CARE  You may take the pessary out once or twice a week at bedtime and leave it out overnight.   Clean the pessary with soap and water only.   If you develop green foul smelling discharge, leave the pessary out for 2 - 3 days.   Use KY jelly or any other personal lubricant that is water based to help with placement and removal.   Wear a sanitary pad until you are sure you understand if you have urinary incontinence with the pessary in place.   Wear biking type shorts with crotch support for exercise until you know that the pessary will not come out easily with maneuvers causing increases in abdominal pressure.  The pessary needs to be removed for sexual activity.   Look before you flush the toilet to be sure the pessary has not come out with straining on the toilet!  Please call for any pain or vaginal bleeding.    How to Use a Vaginal Pessary  A vaginal pessary is a removable device that is placed into your vagina to support pelvic organs that droop. These organs include your uterus, bladder, and rectum. When your pelvic organs drop down into your vagina, it causes a condition called pelvic organ prolapse (POP). A pessary may be an alternative to surgery for women with POP. It may help women who leak urine when they strain or exercise (stress incontinence). This is a symptom of POP. A vaginal pessary may also be a temporary treatment for stress incontinence during pregnancy. There are several types of pessaries. All types are usually made of silicone. You can insert and remove some on your own. Other types must be inserted and removed by your health care provider at office visits. The reason you are using a pessary and the severity of your condition will determine which one is best for you. It is also important to find the right size. A pessary that is too small may fall out. A pessary that is too large may cause pain or discomfort. Your health care provider will do a physical exam  to find the correct size and fit for your pessary. It may take several appointments to find the best fit for you. If you can be fit with the type of pessary that you can insert, remove, and clean yourself, your health care provider will teach you how to use your pessary at home. You may have checkups every few months. If you have the type of pessary that needs to be inserted and removed by your health care provider, you will have appointments every few months to have the pessary removed, cleaned, and replaced. What are the risks? When properly fitted and cared for, risks of using a vaginal pessary can be small. However, there can be problems that may include: Vaginal discharge. Vaginal bleeding. A bad smell coming from your vagina. Scraping of the skin inside your vagina. How to use your pessary Follow your health care provider's instructions for using a pessary. These instructions may vary, depending on the type of pessary you have. To insert a pessary: Wash your hands with soap and water for at least 20 seconds. Squeeze or fold the pessary in half and lubricate the tip with a water-based lubricant. Insert the pessary into your vagina. It will unfold and provide support. To remove the pessary, gently tug it out of your vagina. You can remove the pessary every night or after several days. You can also remove it to have sex. How  to care for your pessary If you have a pessary that you can remove: Clean your pessary with soap and water. Rinse well. Dry it completely before inserting it back into your vagina. Follow these instructions at home: Take over-the-counter and prescription medicines only as told by your health care provider. Your health care provider may prescribe an estrogen cream to moisten your vagina. Keep all follow-up visits. This is important. Contact a health care provider if: You feel any pain or discomfort when your pessary is in place. You continue to have stress  incontinence. You have trouble keeping your pessary from falling out. You have an unusual vaginal discharge that is blood-tinged or smells bad. Summary A vaginal pessary is a removable device that is placed into your vagina to support pelvic organs that droop. This condition is called pelvic organ prolapse (POP). There are several types of pessaries. Some you can insert and remove on your own. Others must be inserted and removed by your health care provider. The best type for you depends on the reason you are using a pessary and the severity of your condition. It is also important to find the right size. If you can use the type that you insert and remove on your own, your health care provider will teach you how to use it and schedule checkups every few months. If you have the type that needs to be inserted and removed by your health care provider, you will have regular appointments to have your pessary removed, cleaned, and replaced. This information is not intended to replace advice given to you by your health care provider. Make sure you discuss any questions you have with your health care provider. Document Revised: 07/06/2019 Document Reviewed: 07/06/2019 Elsevier Patient Education  2023 [employer].

## 2021-06-11 NOTE — Progress Notes (Signed)
GYNECOLOGY  VISIT   HPI: 86 y.o.   Single  African American  female   G1P1 with No LMP recorded. Patient is postmenopausal.   here for pessary check    Patient uses #3 ring with support pessary for uterine prolapse.  She states her pessary came out after 3 days, when she was using the commode.  She was able to catch it as it came out.  States it felt good when the pessary was in place in the vagina.  She was not able to place the pessary in the vaginal after it fell out.   Some left sided discomfort.  Has constipation and takes stool softener daily.  GYNECOLOGIC HISTORY: No LMP recorded. Patient is postmenopausal. Contraception:  PMP Menopausal hormone therapy:  None Last mammogram:  11-14-20 normal Cat.B BiRADS 1 Last pap smear:   08-10-12 Normal        OB History     Gravida  1   Para  1   Term      Preterm      AB      Living  1      SAB      IAB      Ectopic      Multiple      Live Births                 Patient Active Problem List   Diagnosis Date Noted   Osteoarthritis of right knee 07/24/2020   Cognitive deficit with impaired visuospatial function 01/03/2020   Memory impairment of gradual onset 01/03/2020   Anxiety about health 01/03/2020   Dementia without behavioral disturbance (HCC) 01/03/2020   Gastroesophageal reflux disease without esophagitis 07/17/2019   Preoperative cardiovascular examination 05/13/2018   Vitamin B12 deficiency 07/14/2017   Primary osteoarthritis of left knee 07/07/2013   Type 2 diabetes mellitus with microalbuminuria, without long-term current use of insulin (HCC) 05/26/2012   Anemia 10/23/2011   Encounter for medication monitoring 10/23/2011   Hyponatremia 12/01/2010    Class: Acute   Chest pain with moderate risk for cardiac etiology 11/26/2010    Class: Acute   CYSTOCELE WITH INCOMPLETE UTERINE PROLAPSE 03/10/2010   HYPERGLYCEMIA 02/06/2010   ESOPHAGEAL STRICTURE 12/11/2009   Obesity 12/09/2009   PROBLEMS  WITH SWALLOWING AND MASTICATION 12/09/2009   MICROSCOPIC HEMATURIA 07/29/2009   ARM PAIN, RIGHT 03/28/2009   Fatigue 03/28/2009   CONSTIPATION 09/25/2008   Sinusitis, chronic 02/23/2008   Osteoarthrosis involving lower leg 09/28/2007   LEG EDEMA, BILATERAL 08/31/2007   FLANK PAIN, LEFT 06/08/2007   SPRAIN&STRAIN OTH SPEC SITES SHOULDER&UPPER ARM 06/08/2007   BENIGN POSITIONAL VERTIGO 04/25/2007   Headache 03/24/2007   HYPERCHOLESTEROLEMIA 03/03/2007   GAD (generalized anxiety disorder) 03/03/2007   HYPERTENSION, BENIGN ESSENTIAL 03/03/2007   Allergic rhinitis 03/03/2007   HIP PAIN, LEFT 03/03/2007   Osteoporosis 03/03/2007    Past Medical History:  Diagnosis Date   ALLERGIC RHINITIS    Allergy    ANXIETY    ARM PAIN, RIGHT    BENIGN POSITIONAL VERTIGO    Carcinoma in situ of colon    CONSTIPATION    CYSTOCELE WITH INCOMPLETE UTERINE PROLAPSE    Diabetes mellitus without complication (HCC)    ESOPHAGEAL STRICTURE    FATIGUE    FLANK PAIN, LEFT    Headache(784.0)    HIP PAIN, LEFT    HYPERCHOLESTEROLEMIA    HYPERGLYCEMIA    HYPERTENSION, BENIGN ESSENTIAL 03/03/2007   LEG EDEMA, BILATERAL    Microscopic  hematuria    OBESITY    OSTEOARTHRITIS, KNEE    OSTEOPOROSIS    Peripheral neuropathy, idiopathic    Problems with swallowing and mastication    SINUSITIS, RECURRENT    SPRAIN&STRAIN OTH SPEC SITES SHOULDER&UPPER ARM     Past Surgical History:  Procedure Laterality Date   COLONOSCOPY     KNEE ARTHROPLASTY Right 07/24/2020   Procedure: COMPUTER ASSISTED TOTAL KNEE ARTHROPLASTY;  Surgeon: Samson Frederic, MD;  Location: [facility];  Service: Orthopedics;  Laterality: Right;   LEFT HEART CATHETERIZATION WITH CORONARY ANGIOGRAM N/A 12/02/2010   Procedure: LEFT HEART CATHETERIZATION WITH CORONARY ANGIOGRAM;  Surgeon: Peter M Swaziland, MD;  Location: [facility] CATH LAB;  Service: Cardiovascular;:: NORMAL CORONARY ARTERIES - NORMAL LV FUNCTION & EDP.     TRANSTHORACIC ECHOCARDIOGRAM   11/2010   Normal EF 60-65%. No RWMA.  Mild LAD dilation c/w Gr 1 DD.  -- PRETTY NORMAL ECHO    Current Outpatient Medications  Medication Sig Dispense Refill   ALPRAZolam (XANAX) 0.25 MG tablet Take 0.5 tablets (0.125 mg total) by mouth at bedtime as needed for anxiety. 30 tablet 0   amLODipine (NORVASC) 10 MG tablet Take 1 tablet (10 mg total) by mouth daily. 90 tablet 1   aspirin 81 MG chewable tablet Chew 1 tablet (81 mg total) by mouth 2 (two) times daily.     atorvastatin (LIPITOR) 10 MG tablet Take 1 tablet (10 mg total) by mouth daily. 90 tablet 3   azelastine (ASTELIN) 0.1 % nasal spray INSTILL 1 SPRAY INTO BOTH NOSTRILS TWICE DAILY 30 mL 12   buPROPion (WELLBUTRIN XL) 150 MG 24 hr tablet Take 1 tablet (150 mg total) by mouth daily. 90 tablet 1   Carboxymethylcellul-Glycerin (REFRESH OPTIVE) 1-0.9 % GEL Place 1 drop into both eyes daily.     donepezil (ARICEPT) 10 MG tablet TAKE 1 TABLET(5 MG) BY MOUTH AT BEDTIME 90 tablet 5   donepezil (ARICEPT) 5 MG tablet TAKE 1 TABLET(5 MG) BY MOUTH AT BEDTIME 90 tablet 3   empagliflozin (JARDIANCE) 10 MG TABS tablet Take 1 tablet (10 mg total) by mouth daily before breakfast. 90 tablet 0   furosemide (LASIX) 40 MG tablet Take 1 tablet (40 mg total) by mouth daily. 90 tablet 1   HYDROcodone-acetaminophen (NORCO/VICODIN) 5-325 MG tablet Take 1 tablet by mouth every 6 (six) hours as needed for moderate pain. For chronic pain 90 tablet 0   hydrOXYzine (ATARAX) 10 MG tablet Take 1 tablet (10 mg total) by mouth 3 (three) times daily as needed. 90 tablet 1   lisinopril (ZESTRIL) 40 MG tablet TAKE 1 TABLET(40 MG) BY MOUTH DAILY 90 tablet 1   loperamide (IMODIUM A-D) 2 MG tablet Take 1 tablet (2 mg total) by mouth 2 (two) times daily as needed for diarrhea or loose stools. 30 tablet 0   meclizine (ANTIVERT) 50 MG tablet Take 1 tablet (50 mg total) by mouth 3 (three) times daily as needed. 30 tablet 0   omeprazole (PRILOSEC) 40 MG capsule TAKE 1 CAPSULE(40  MG) BY MOUTH DAILY 90 capsule 3   predniSONE (DELTASONE) 20 MG tablet Take 1 tablet (20 mg total) by mouth daily with breakfast. 5 tablet 0   traZODone (DESYREL) 100 MG tablet Take 1 tablet (100 mg total) by mouth at bedtime. 90 tablet 1   No current facility-administered medications for this visit.     ALLERGIES: Patient has no known allergies.  Family History  Problem Relation Age of Onset   Coronary artery  disease Mother [id number]   Cancer Father [id number]   Heart disease Father    Colon cancer Other    Diabetes Other    Coronary artery disease Sister    Diabetes type II Sister    Cancer Sister    Coronary artery disease Brother    Kidney disease Brother    Coronary artery disease Brother    Breast cancer Neg Hx     Social History   Socioeconomic History   Marital status: Single    Spouse name: Not on file   Number of children: 1   Years of education: 12   Highest education level: High school graduate  Occupational History   Not on file  Tobacco Use   Smoking status: Never   Smokeless tobacco: Never  Vaping Use   Vaping Use: Never used  Substance and Sexual Activity   Alcohol use: No   Drug use: No   Sexual activity: Not Currently  Other Topics Concern   Not on file  Social History Narrative   She tries to stay active, but over the last year (20 19-20 20), she has been very limited by her right knee pain.   Is able to go grocery shopping.   Social Determinants of Health   Financial Resource Strain: Not on file  Food Insecurity: Not on file  Transportation Needs: Not on file  Physical Activity: Not on file  Stress: Not on file  Social Connections: Not on file  Intimate Partner Violence: Not on file    Review of Systems  See HPI.   PHYSICAL EXAMINATION:    BP 120/74 (BP Location: Left Arm, Patient Position: Sitting, Cuff Size: Normal)     General appearance: alert, cooperative and appears stated age   Pelvic: External genitalia:  no lesions               Urethra:  normal appearing urethra with no masses, tenderness or lesions              Bartholins and Skenes: normal                 Vagina: normal appearing vagina with normal color and discharge, no lesions              Cervix: no lesions                Bimanual Exam:  Uterus:  normal size, contour, position, consistency, mobility, non-tender              Adnexa: no mass, fullness, tenderness   Chaperone was present for exam:  Selena BattenKim, CMA  ASSESSMENT  Uterine prolapse.  Pessary maintenance.  Patient doing well with ring with support pessary overall.  Constipation.  Left sided discomfort.   PLAN  Patient was able to replace the pessary today, but she could not remove it.  I recommend she take the pessary out with bowel movements if it starts to come out on the commode.  I recommended Miralax in addition to her daily stool softener.  She will see her PCP if her left sided discomfort persists.  Fu here in 3 months and prn.   An After Visit Summary was printed and given to the patient.  29 min  total time was spent for this patient encounter, including preparation, face-to-face counseling with the patient, coordination of care, and documentation of the encounter.

## 2021-06-11 NOTE — Telephone Encounter (Signed)
Thank you,  Rich

## 2021-06-12 ENCOUNTER — Other Ambulatory Visit: Payer: Self-pay | Admitting: Registered Nurse

## 2021-06-12 DIAGNOSIS — J301 Allergic rhinitis due to pollen: Secondary | ICD-10-CM

## 2021-06-18 ENCOUNTER — Other Ambulatory Visit: Payer: Self-pay | Admitting: Registered Nurse

## 2021-06-25 ENCOUNTER — Encounter: Payer: Self-pay | Admitting: Obstetrics and Gynecology

## 2021-06-25 ENCOUNTER — Ambulatory Visit (INDEPENDENT_AMBULATORY_CARE_PROVIDER_SITE_OTHER): Payer: Medicare Other | Admitting: Obstetrics and Gynecology

## 2021-06-25 VITALS — BP 120/74

## 2021-06-25 DIAGNOSIS — K5900 Constipation, unspecified: Secondary | ICD-10-CM | POA: Diagnosis not present

## 2021-06-25 DIAGNOSIS — N814 Uterovaginal prolapse, unspecified: Secondary | ICD-10-CM | POA: Diagnosis not present

## 2021-06-25 DIAGNOSIS — Z4689 Encounter for fitting and adjustment of other specified devices: Secondary | ICD-10-CM | POA: Diagnosis not present

## 2021-06-25 NOTE — Patient Instructions (Addendum)
Polyethylene Glycol Powder for Solution  This is also called Miralax medication for constipation.   What is this medication? POLYETHYLENE GLYCOL (pol ee ETH i leen; GLYE col) prevents and treats occasional constipation. It works by increasing the amount of water your intestine absorbs. This softens the stool, making it easier to have a bowel movement. It also increases pressure, which prompts the muscles in your intestines to move stool. It belongs to a group of medications called laxatives. This medicine may be used for other purposes; ask your health care provider or pharmacist if you have questions. COMMON BRAND NAME(S): GaviLax, GIALAX, GlycoLax, Healthylax, MiraLax, Smooth LAX, Vita Health What should I tell my care team before I take this medication? They need to know if you have any of these conditions: History of blockage in your bowels Nausea Phenylketonuria Stomach or intestine problem Stomach pain Sudden change in bowel habit lasting more than 2 weeks Vomiting An unusual or allergic reaction to polyethylene glycol (PEG), other medications, foods, dyes, or preservatives Pregnant or trying to get pregnant Breast-feeding How should I use this medication? Take this medication by mouth. Take it as directed on the label. Add the right dose to 4 to 8 ounces or 120 to 240 mL of water, juice, soda, coffee or tea. Do not mix this medication with foods or other liquids. Do not combine with starch-based thickeners (e.g., flour, cornstarch, arrowroot, tapioca, xanthan gum). Mix well. Drink the solution. Do not use it more often than directed. Talk to your care team about the use of this medication in children. While it may be given to children as young as 16 years for selected conditions, precautions do apply. Overdosage: If you think you have taken too much of this medicine contact a poison control center or emergency room at once. NOTE: This medicine is only for you. Do not share this medicine  with others. What if I miss a dose? If you miss a dose, take it as soon as you can. If it is almost time for your next dose, take only that dose. Do not take double or extra doses. What may interact with this medication? Interactions are not expected. This list may not describe all possible interactions. Give your health care provider a list of all the medicines, herbs, non-prescription drugs, or dietary supplements you use. Also tell them if you smoke, drink alcohol, or use illegal drugs. Some items may interact with your medicine. What should I watch for while using this medication? Do not use for more than one week without advice from your care team. If your constipation returns, check with your care team. Drink plenty of water while taking this medication. Drinking water helps decrease constipation. Stop using this medication and contact your care team if you experience any rectal bleeding or do not have a bowel movement after use. These could be signs of a more serious condition. What side effects may I notice from receiving this medication? Side effects that you should report to your care team as soon as possible: Allergic reactions--skin rash, itching, hives, swelling of the face, lips, tongue, or throat Side effects that usually do not require medical attention (report to your care team if they continue or are bothersome): Bloating Gas Nausea Stomach cramping This list may not describe all possible side effects. Call your doctor for medical advice about side effects. You may report side effects to FDA at 1-800-FDA-1088. Where should I keep my medication? Keep out of the reach of children and pets.  Store at room temperature between 20 and 25 degrees C (68 and 77 degrees F). Get rid of any unused medication after the expiration date. To get rid of medications that are no longer needed or have expired: Take the medication to a medication take-back program. Check with your pharmacy or law  enforcement to find a location. If you cannot return the medication, check the label or package insert to see if the medication should be thrown out in the garbage or flushed down the toilet. If you are not sure, ask your care team. If it is safe to put it in the trash, pour the medication out of the container. Mix the medication with cat litter, dirt, coffee grounds, or other unwanted substance. Seal the mixture in a bag or container. Put it in the trash. NOTE: This sheet is a summary. It may not cover all possible information. If you have questions about this medicine, talk to your doctor, pharmacist, or health care provider.  2023 Elsevier/Gold Standard (2020-11-21 00:00:00)   PESSARY CARE  You may take the pessary out once or twice a week at bedtime and leave it out overnight.   Clean the pessary with soap and water only.   If you develop green foul smelling discharge, leave the pessary out for 2 - 3 days.   Use KY jelly or any other personal lubricant that is water based to help with placement and removal.   Wear a sanitary pad until you are sure you understand if you have urinary incontinence with the pessary in place.   Wear biking type shorts with crotch support for exercise until you know that the pessary will not come out easily with maneuvers causing increases in abdominal pressure.  The pessary needs to be removed for sexual activity.   Look before you flush the toilet to be sure the pessary has not come out with straining on the toilet!  Please call for any pain or vaginal bleeding.

## 2021-07-15 ENCOUNTER — Other Ambulatory Visit: Payer: Self-pay

## 2021-07-15 ENCOUNTER — Telehealth (INDEPENDENT_AMBULATORY_CARE_PROVIDER_SITE_OTHER): Payer: Medicare Other | Admitting: Registered Nurse

## 2021-07-15 ENCOUNTER — Encounter: Payer: Self-pay | Admitting: Registered Nurse

## 2021-07-15 DIAGNOSIS — J0110 Acute frontal sinusitis, unspecified: Secondary | ICD-10-CM | POA: Diagnosis not present

## 2021-07-15 DIAGNOSIS — F411 Generalized anxiety disorder: Secondary | ICD-10-CM

## 2021-07-15 DIAGNOSIS — M170 Bilateral primary osteoarthritis of knee: Secondary | ICD-10-CM | POA: Diagnosis not present

## 2021-07-15 MED ORDER — AMOXICILLIN 875 MG PO TABS: 875.0000 mg | ORAL_TABLET | Freq: Two times a day (BID) | ORAL | 0 refills | Status: AC

## 2021-07-15 MED ORDER — ESCITALOPRAM OXALATE 10 MG PO TABS
10.0000 mg | ORAL_TABLET | Freq: Every day | ORAL | 1 refills | Status: DC
Start: 2021-07-15 — End: 2021-08-19

## 2021-07-15 MED ORDER — PREDNISONE 10 MG PO TABS: 10.0000 mg | ORAL_TABLET | Freq: Every day | ORAL | 0 refills | Status: DC

## 2021-07-15 MED ORDER — HYDROCODONE-ACETAMINOPHEN 5-325 MG PO TABS: 1.0000 | ORAL_TABLET | Freq: Four times a day (QID) | ORAL | 0 refills | Status: DC | PRN

## 2021-07-24 ENCOUNTER — Telehealth: Payer: Self-pay | Admitting: Registered Nurse

## 2021-07-24 NOTE — Telephone Encounter (Signed)
Caller name: Tiffancy (pt)  On DPR? :yes/no: Yes  Call back number: [phone]   Provider they see: [name]  Reason for call: Pt called; states that amoxicillin  875 mg has not been helping with congestion at all. Is there another medication that she can try? If so, please send to [facility] on [address] [phone].

## 2021-07-24 NOTE — Telephone Encounter (Signed)
Left pt a vm stating that they need an apt to be re seen . I explained that they could call our office to make an apt or another [facility] office or go to Urgent Care.

## 2021-07-30 ENCOUNTER — Other Ambulatory Visit: Payer: Self-pay | Admitting: Registered Nurse

## 2021-07-30 DIAGNOSIS — Z794 Long term (current) use of insulin: Secondary | ICD-10-CM

## 2021-08-11 ENCOUNTER — Telehealth: Payer: Self-pay | Admitting: Registered Nurse

## 2021-08-11 NOTE — Telephone Encounter (Signed)
Encourage patient to contact the pharmacy for refills or they can request refills through [facility]  (Please schedule appointment if patient has not been seen in over a year)  Pt has an appt 08/19/21   WHAT PHARMACY WOULD THEY LIKE THIS SENT TO: [facility]  [id number] on [address]  MEDICATION NAME & DOSE: Hydrocodone-acetaminophen 5-325 mg.   NOTES/COMMENTS FROM PATIENT: Pt called stating that she is completely out of her medication. Pt wants to know if she can get a partial refill until her appt.       Front office please notify patient: It takes 48-72 hours to process rx refill requests Ask patient to call pharmacy to ensure rx is ready before heading there.

## 2021-08-12 ENCOUNTER — Other Ambulatory Visit: Payer: Self-pay

## 2021-08-12 DIAGNOSIS — M170 Bilateral primary osteoarthritis of knee: Secondary | ICD-10-CM

## 2021-08-12 MED ORDER — HYDROCODONE-ACETAMINOPHEN 5-325 MG PO TABS: 1.0000 | ORAL_TABLET | Freq: Four times a day (QID) | ORAL | 0 refills | Status: DC | PRN

## 2021-08-12 NOTE — Telephone Encounter (Signed)
Last visit: 07/15/21  Last refill: 07/15/21 for 22 day supply.  PDMP consulted manually. Unable to review in chart.   Jari Sportsman, NP

## 2021-08-12 NOTE — Telephone Encounter (Signed)
Sent,  Thanks,  Rich

## 2021-08-13 ENCOUNTER — Ambulatory Visit (INDEPENDENT_AMBULATORY_CARE_PROVIDER_SITE_OTHER): Payer: Medicare Other | Admitting: Podiatry

## 2021-08-13 ENCOUNTER — Encounter: Payer: Self-pay | Admitting: Podiatry

## 2021-08-13 DIAGNOSIS — E1142 Type 2 diabetes mellitus with diabetic polyneuropathy: Secondary | ICD-10-CM

## 2021-08-13 DIAGNOSIS — M79674 Pain in right toe(s): Secondary | ICD-10-CM | POA: Diagnosis not present

## 2021-08-13 DIAGNOSIS — B351 Tinea unguium: Secondary | ICD-10-CM | POA: Diagnosis not present

## 2021-08-13 DIAGNOSIS — M79675 Pain in left toe(s): Secondary | ICD-10-CM

## 2021-08-18 NOTE — Progress Notes (Signed)
  Subjective:  Patient ID: Casey [name], female    DOB: 1935-07-27,  MRN: [id number]  Casey [name] presents to clinic today for at risk foot care with history of diabetic neuropathy and painful elongated mycotic toenails 1-5 bilaterally which are tender when wearing enclosed shoe gear. Pain is relieved with periodic professional debridement.  Last A1c was aorund 7%.  Patient did not check blood glucose today.  New problem(s): None.   PCP is Casey Agee, NP , and last visit was  July 15, 2021  No Known Allergies  Review of Systems: Negative except as noted in the HPI.  Objective: No changes noted in today's physical examination. There were no vitals filed for this visit.  Casey [name] is a pleasant 86 y.o. female in NAD. AAO X 3.  Vascular Examination: CFT <3 seconds b/l LE. Faintly palpable DP pulses b/l LE. Diminished PT pulse(s) b/l LE. Nonpitting edema noted bilateral ankles. No cyanosis or clubbing noted b/l LE.  Dermatological Examination: Pedal skin is warm and supple b/l LE. No open wounds b/l LE. No interdigital macerations noted b/l LE. Toenails 1-5 bilaterally elongated, discolored, dystrophic, thickened, and crumbly with subungual debris and tenderness to dorsal palpation. No hyperkeratotic nor porokeratotic lesions present on today's visit.  Neurological Examination: Pt has subjective symptoms of neuropathy. Protective sensation intact 5/5 intact bilaterally with 10g monofilament b/l. Vibratory sensation intact b/l.  Musculoskeletal Examination: Normal muscle strength 5/5 to all lower extremity muscle groups bilaterally. Pes planus deformity noted bilateral LE.Marland Kitchen No pain, crepitus or joint limitation noted with ROM b/l LE.  Utilizes cane for ambulation assistance.      Latest Ref Rng & Units 04/15/2021    9:18 AM 02/21/2021   11:28 AM 10/16/2020    9:30 AM  Hemoglobin A1C  Hemoglobin-A1c 4.0 - 5.6 % 7.0  6.7  7.4    Assessment/Plan: 1. Pain due to  onychomycosis of toenails of both feet   2. Diabetic peripheral neuropathy associated with type 2 diabetes mellitus (HCC)      -Examined patient. -Continue foot and shoe inspections daily. Monitor blood glucose per PCP/Endocrinologist's recommendations. -Patient to continue soft, supportive shoe gear daily. -Toenails 1-5 b/l were debrided in length and girth with sterile nail nippers and dremel without iatrogenic bleeding.  -Patient/POA to call should there be question/concern in the interim.   Return in about 3 months (around 11/13/2021).  Casey [name], DPM

## 2021-08-19 ENCOUNTER — Encounter: Payer: Self-pay | Admitting: Registered Nurse

## 2021-08-19 ENCOUNTER — Ambulatory Visit (INDEPENDENT_AMBULATORY_CARE_PROVIDER_SITE_OTHER): Payer: Medicare Other | Admitting: Registered Nurse

## 2021-08-19 ENCOUNTER — Other Ambulatory Visit: Payer: Self-pay

## 2021-08-19 VITALS — BP 148/72 | HR 64 | Temp 98.0°F | Resp 18 | Ht 62.0 in | Wt 166.0 lb

## 2021-08-19 DIAGNOSIS — F5104 Psychophysiologic insomnia: Secondary | ICD-10-CM | POA: Diagnosis not present

## 2021-08-19 DIAGNOSIS — J301 Allergic rhinitis due to pollen: Secondary | ICD-10-CM | POA: Diagnosis not present

## 2021-08-19 DIAGNOSIS — I10 Essential (primary) hypertension: Secondary | ICD-10-CM | POA: Diagnosis not present

## 2021-08-19 DIAGNOSIS — E7800 Pure hypercholesterolemia, unspecified: Secondary | ICD-10-CM

## 2021-08-19 DIAGNOSIS — E1122 Type 2 diabetes mellitus with diabetic chronic kidney disease: Secondary | ICD-10-CM

## 2021-08-19 DIAGNOSIS — M170 Bilateral primary osteoarthritis of knee: Secondary | ICD-10-CM

## 2021-08-19 DIAGNOSIS — Z794 Long term (current) use of insulin: Secondary | ICD-10-CM

## 2021-08-19 DIAGNOSIS — F411 Generalized anxiety disorder: Secondary | ICD-10-CM

## 2021-08-19 DIAGNOSIS — N1831 Chronic kidney disease, stage 3a: Secondary | ICD-10-CM

## 2021-08-19 DIAGNOSIS — R413 Other amnesia: Secondary | ICD-10-CM

## 2021-08-19 DIAGNOSIS — K219 Gastro-esophageal reflux disease without esophagitis: Secondary | ICD-10-CM

## 2021-08-19 LAB — COMPREHENSIVE METABOLIC PANEL
ALT: 12 U/L (ref 0–35)
AST: 15 U/L (ref 0–37)
Albumin: 4.5 g/dL (ref 3.5–5.2)
Alkaline Phosphatase: 74 U/L (ref 39–117)
BUN: 13 mg/dL (ref 6–23)
CO2: 27 mEq/L (ref 19–32)
Calcium: 9.9 mg/dL (ref 8.4–10.5)
Chloride: 102 mEq/L (ref 96–112)
Creatinine, Ser: 0.93 mg/dL (ref 0.40–1.20)
GFR: 56.44 mL/min — ABNORMAL LOW (ref >60.00)
Glucose, Bld: 110 mg/dL — ABNORMAL HIGH (ref 70–99)
Potassium: 3.6 mEq/L (ref 3.5–5.1)
Sodium: 138 mEq/L (ref 135–145)
Total Bilirubin: 0.4 mg/dL (ref 0.2–1.2)
Total Protein: 7.8 g/dL (ref 6.0–8.3)

## 2021-08-19 LAB — HEMOGLOBIN A1C: Hgb A1c MFr Bld: 7.2 % — ABNORMAL HIGH (ref 4.6–6.5)

## 2021-08-19 LAB — CBC WITH DIFFERENTIAL/PLATELET
Basophils Absolute: 0.1 10*3/uL (ref 0.0–0.1)
Basophils Relative: 1.1 % (ref 0.0–3.0)
Eosinophils Absolute: 0.1 10*3/uL (ref 0.0–0.7)
Eosinophils Relative: 2.2 % (ref 0.0–5.0)
HCT: 38.3 % (ref 36.0–46.0)
Hemoglobin: 12.4 g/dL (ref 12.0–15.0)
Lymphocytes Relative: 44.4 % (ref 12.0–46.0)
Lymphs Abs: 2.7 10*3/uL (ref 0.7–4.0)
MCHC: 32.3 g/dL (ref 30.0–36.0)
MCV: 85.8 fl (ref 78.0–100.0)
Monocytes Absolute: 0.5 10*3/uL (ref 0.1–1.0)
Monocytes Relative: 7.7 % (ref 3.0–12.0)
Neutro Abs: 2.7 10*3/uL (ref 1.4–7.7)
Neutrophils Relative %: 44.6 % (ref 43.0–77.0)
Platelets: 287 10*3/uL (ref 150.0–400.0)
RBC: 4.46 Mil/uL (ref 3.87–5.11)
RDW: 15.6 % — ABNORMAL HIGH (ref 11.5–15.5)
WBC: 6.2 10*3/uL (ref 4.0–10.5)

## 2021-08-19 LAB — LIPID PANEL
Cholesterol: 213 mg/dL — ABNORMAL HIGH (ref 0–200)
HDL: 95.9 mg/dL (ref >39.00)
LDL Cholesterol: 102 mg/dL — ABNORMAL HIGH (ref 0–99)
NonHDL: 116.69
Total CHOL/HDL Ratio: 2
Triglycerides: 74 mg/dL (ref 0.0–149.0)
VLDL: 14.8 mg/dL (ref 0.0–40.0)

## 2021-08-19 LAB — MICROALBUMIN / CREATININE URINE RATIO
Creatinine,U: 114.1 mg/dL
Microalb Creat Ratio: 6.1 mg/g (ref 0.0–30.0)
Microalb, Ur: 7 mg/dL — ABNORMAL HIGH (ref 0.0–1.9)

## 2021-08-19 LAB — TSH: TSH: 1.2 u[IU]/mL (ref 0.35–5.50)

## 2021-08-19 MED ORDER — OMEPRAZOLE 40 MG PO CPDR: DELAYED_RELEASE_CAPSULE | ORAL | 3 refills | Status: DC

## 2021-08-19 MED ORDER — LISINOPRIL 40 MG PO TABS: ORAL_TABLET | ORAL | 1 refills | Status: DC

## 2021-08-19 MED ORDER — AZELASTINE HCL 0.1 % NA SOLN: NASAL | 12 refills | Status: DC

## 2021-08-19 MED ORDER — DONEPEZIL HCL 10 MG PO TABS: ORAL_TABLET | ORAL | 5 refills | Status: DC

## 2021-08-19 MED ORDER — QUETIAPINE FUMARATE ER 50 MG PO TB24: 50.0000 mg | ORAL_TABLET | Freq: Every day | ORAL | 0 refills | Status: DC

## 2021-08-19 MED ORDER — FUROSEMIDE 40 MG PO TABS: 40.0000 mg | ORAL_TABLET | Freq: Every day | ORAL | 1 refills | Status: DC

## 2021-08-19 MED ORDER — ESCITALOPRAM OXALATE 10 MG PO TABS: 10.0000 mg | ORAL_TABLET | Freq: Every day | ORAL | 1 refills | Status: DC

## 2021-08-19 MED ORDER — BUPROPION HCL ER (XL) 150 MG PO TB24: 150.0000 mg | ORAL_TABLET | Freq: Every day | ORAL | 1 refills | Status: DC

## 2021-08-19 MED ORDER — EMPAGLIFLOZIN 10 MG PO TABS: ORAL_TABLET | ORAL | 0 refills | Status: DC

## 2021-08-19 MED ORDER — ATORVASTATIN CALCIUM 10 MG PO TABS: 10.0000 mg | ORAL_TABLET | Freq: Every day | ORAL | 3 refills | Status: DC

## 2021-08-19 MED ORDER — HYDROCODONE-ACETAMINOPHEN 5-325 MG PO TABS: 1.0000 | ORAL_TABLET | Freq: Four times a day (QID) | ORAL | 0 refills | Status: DC | PRN

## 2021-08-19 MED ORDER — AMLODIPINE BESYLATE 10 MG PO TABS: 10.0000 mg | ORAL_TABLET | Freq: Every day | ORAL | 1 refills | Status: DC

## 2021-08-19 NOTE — Assessment & Plan Note (Signed)
Continue statin. Recheck labs today and adjust as indicated.

## 2021-08-19 NOTE — Assessment & Plan Note (Signed)
Will start low dose of seroquel xr at nighttime. Continue wellbutrin. Med check in 2-3 mo

## 2021-08-19 NOTE — Progress Notes (Signed)
Established Patient Office Visit  Subjective:  Patient ID: Casey [name], female    DOB: July 29, 1935  Age: 86 y.o. MRN: [id number]  CC:  Chief Complaint  Patient presents with   Follow-up    Patient states she is here for a follow up and also having some pain.    HPI Casey [name] presents for follow up   Doing well. She does lament the fact that she cannot take alprazolam anymore as her anxiety continues to be severe. However, she is interested in alternatives. Has not used seroquel previously, but would appreciate sedative to help sleep. She does have OA but low fall risk considering her age - she has had no falls. She does take wellbutrin which helps somewhat with anxiety. Wants to continue this.   Pain in both knees severe. Prn use of opioid analgesic for past number of years. She hopes to continue this. No AE to this in past. Uses sparingly.   Hypertension: Patient Currently taking: amlodipine 10mg  po qd, lasix 40mg  po qd, lisinopril 40mg  po qd Good effect. No AEs. Denies CV symptoms including: chest pain, shob, doe, headache, visual changes, fatigue, claudication, and dependent edema.   Previous readings and labs: BP Readings from Last 3 Encounters:  08/19/21 (!) 148/72  06/25/21 120/74  06/11/21 (!) 156/70   Lab Results  Component Value Date   CREATININE 0.93 08/19/2021    T2dm Last A1c:  Lab Results  Component Value Date   HGBA1C 7.2 (H) 08/19/2021    Currently taking: jardiance 10mg  po qd,  No new complications Reports good compliance with medications Diet has been stable Exercise habits have been stable, limited by pain from OA    Outpatient Medications Prior to Visit  Medication Sig Dispense Refill   ALPRAZolam (XANAX) 0.25 MG tablet Take 0.5 tablets (0.125 mg total) by mouth at bedtime as needed for anxiety. 30 tablet 0   amLODipine (NORVASC) 5 MG tablet      aspirin 81 MG chewable tablet Chew 1 tablet (81 mg total) by mouth 2 (two) times daily.      azelastine (OPTIVAR) 0.05 % ophthalmic solution Place 1 drop into both eyes 2 (two) times daily.     busPIRone (BUSPAR) 5 MG tablet      Carboxymethylcellul-Glycerin (REFRESH OPTIVE) 1-0.9 % GEL Place 1 drop into both eyes daily.     celecoxib (CELEBREX) 200 MG capsule      cyanocobalamin (VITAMIN B12) 500 MCG tablet      donepezil (ARICEPT) 5 MG tablet TAKE 1 TABLET(5 MG) BY MOUTH AT BEDTIME 90 tablet 3   hydrochlorothiazide (HYDRODIURIL) 12.5 MG tablet      hydrOXYzine (ATARAX) 10 MG tablet Take 1 tablet (10 mg total) by mouth 3 (three) times daily as needed. 90 tablet 1   loperamide (IMODIUM A-D) 2 MG tablet Take 1 tablet (2 mg total) by mouth 2 (two) times daily as needed for diarrhea or loose stools. 30 tablet 0   meclizine (ANTIVERT) 50 MG tablet Take 1 tablet (50 mg total) by mouth 3 (three) times daily as needed. 30 tablet 0   metFORMIN (GLUCOPHAGE) 500 MG tablet 500 mg by oral route.     ondansetron (ZOFRAN) 4 MG tablet      oxyCODONE (OXY IR/ROXICODONE) 5 MG immediate release tablet Take 1-2 tablet(s) EVERY 6 HOURS by oral route as needed for pain.     predniSONE (DELTASONE) 10 MG tablet Take 1 tablet (10 mg total) by mouth daily with breakfast.  5 tablet 0   amLODipine (NORVASC) 10 MG tablet Take 1 tablet (10 mg total) by mouth daily. 90 tablet 1   atorvastatin (LIPITOR) 10 MG tablet Take 1 tablet (10 mg total) by mouth daily. 90 tablet 3   azelastine (ASTELIN) 0.1 % nasal spray INSTILL 1 SPRAY INTO BOTH NOSTRILS TWICE DAILY 30 mL 12   buPROPion (WELLBUTRIN XL) 150 MG 24 hr tablet Take 1 tablet (150 mg total) by mouth daily. 90 tablet 1   donepezil (ARICEPT) 10 MG tablet TAKE 1 TABLET(5 MG) BY MOUTH AT BEDTIME 90 tablet 5   escitalopram (LEXAPRO) 10 MG tablet Take 1 tablet (10 mg total) by mouth daily. 90 tablet 1   furosemide (LASIX) 40 MG tablet Take 1 tablet (40 mg total) by mouth daily. 90 tablet 1   HYDROcodone-acetaminophen (NORCO/VICODIN) 5-325 MG tablet Take 1 tablet by  mouth every 6 (six) hours as needed for moderate pain. For chronic pain 90 tablet 0   JARDIANCE 10 MG TABS tablet TAKE 1 TABLET(10 MG) BY MOUTH DAILY BEFORE BREAKFAST 90 tablet 0   lisinopril (ZESTRIL) 40 MG tablet TAKE 1 TABLET(40 MG) BY MOUTH DAILY 90 tablet 1   omeprazole (PRILOSEC) 40 MG capsule TAKE 1 CAPSULE(40 MG) BY MOUTH DAILY 90 capsule 3   traZODone (DESYREL) 100 MG tablet Take 1 tablet (100 mg total) by mouth at bedtime. 90 tablet 1   No facility-administered medications prior to visit.    Review of Systems  Constitutional: Negative.   HENT: Negative.    Eyes: Negative.   Respiratory: Negative.    Cardiovascular: Negative.   Gastrointestinal: Negative.   Genitourinary: Negative.   Musculoskeletal: Negative.   Skin: Negative.   Neurological: Negative.   Psychiatric/Behavioral: Negative.    All other systems reviewed and are negative.     Objective:     BP (!) 148/72   Pulse 64   Temp 98 F (36.7 C) (Temporal)   Resp 18   Ht 5\' 2"  (1.575 m)   Wt 166 lb (75.3 kg)   SpO2 (!) 86%   BMI 30.36 kg/m   Wt Readings from Last 3 Encounters:  08/19/21 166 lb (75.3 kg)  06/11/21 174 lb (78.9 kg)  05/20/21 174 lb (78.9 kg)   Physical Exam Vitals and nursing note reviewed.  Constitutional:      General: She is not in acute distress.    Appearance: Normal appearance. She is normal weight. She is not ill-appearing, toxic-appearing or diaphoretic.  Cardiovascular:     Rate and Rhythm: Normal rate and regular rhythm.     Heart sounds: Normal heart sounds. No murmur heard.    No friction rub. No gallop.  Pulmonary:     Effort: Pulmonary effort is normal. No respiratory distress.     Breath sounds: Normal breath sounds. No stridor. No wheezing, rhonchi or rales.  Chest:     Chest wall: No tenderness.  Skin:    General: Skin is warm and dry.  Neurological:     General: No focal deficit present.     Mental Status: She is alert and oriented to person, place, and time.  Mental status is at baseline.  Psychiatric:        Mood and Affect: Mood normal.        Behavior: Behavior normal.        Thought Content: Thought content normal.        Judgment: Judgment normal.     Results for orders placed or performed  in visit on 08/19/21  CBC with Differential/Platelet  Result Value Ref Range   WBC 6.2 4.0 - 10.5 K/uL   RBC 4.46 3.87 - 5.11 Mil/uL   Hemoglobin 12.4 12.0 - 15.0 g/dL   HCT 59.4 58.5 - 92.9 %   MCV 85.8 78.0 - 100.0 fl   MCHC 32.3 30.0 - 36.0 g/dL   RDW 24.4 (H) 62.8 - 63.8 %   Platelets 287.0 150.0 - 400.0 K/uL   Neutrophils Relative % 44.6 43.0 - 77.0 %   Lymphocytes Relative 44.4 12.0 - 46.0 %   Monocytes Relative 7.7 3.0 - 12.0 %   Eosinophils Relative 2.2 0.0 - 5.0 %   Basophils Relative 1.1 0.0 - 3.0 %   Neutro Abs 2.7 1.4 - 7.7 K/uL   Lymphs Abs 2.7 0.7 - 4.0 K/uL   Monocytes Absolute 0.5 0.1 - 1.0 K/uL   Eosinophils Absolute 0.1 0.0 - 0.7 K/uL   Basophils Absolute 0.1 0.0 - 0.1 K/uL  Comprehensive metabolic panel  Result Value Ref Range   Sodium 138 135 - 145 mEq/L   Potassium 3.6 3.5 - 5.1 mEq/L   Chloride 102 96 - 112 mEq/L   CO2 27 19 - 32 mEq/L   Glucose, Bld 110 (H) 70 - 99 mg/dL   BUN 13 6 - 23 mg/dL   Creatinine, Ser 1.77 0.40 - 1.20 mg/dL   Total Bilirubin 0.4 0.2 - 1.2 mg/dL   Alkaline Phosphatase 74 39 - 117 U/L   AST 15 0 - 37 U/L   ALT 12 0 - 35 U/L   Total Protein 7.8 6.0 - 8.3 g/dL   Albumin 4.5 3.5 - 5.2 g/dL   GFR 11.65 (L) >79.03 mL/min   Calcium 9.9 8.4 - 10.5 mg/dL  Hemoglobin Y3F  Result Value Ref Range   Hgb A1c MFr Bld 7.2 (H) 4.6 - 6.5 %  Lipid panel  Result Value Ref Range   Cholesterol 213 (H) 0 - 200 mg/dL   Triglycerides 38.3 0.0 - 149.0 mg/dL   HDL 29.19 >16.60 mg/dL   VLDL 60.0 0.0 - 45.9 mg/dL   LDL Cholesterol 977 (H) 0 - 99 mg/dL   Total CHOL/HDL Ratio 2    NonHDL 116.69   TSH  Result Value Ref Range   TSH 1.20 0.35 - 5.50 uIU/mL  Microalbumin / creatinine urine ratio  Result  Value Ref Range   Microalb, Ur 7.0 (H) 0.0 - 1.9 mg/dL   Creatinine,U 414.2 mg/dL   Microalb Creat Ratio 6.1 0.0 - 30.0 mg/g      The ASCVD Risk score (Arnett DK, et al., 2019) failed to calculate for the following reasons:   The 2019 ASCVD risk score is only valid for ages 37 to 4    Assessment & Plan:   Problem List Items Addressed This Visit       Cardiovascular and Mediastinum   HYPERTENSION, BENIGN ESSENTIAL (Chronic)    Mildly elevated today. Permissible given pain and stress. Will continue current regimen rather than risking hypotension. Follow up in 3 mo      Relevant Medications   amLODipine (NORVASC) 10 MG tablet   atorvastatin (LIPITOR) 10 MG tablet   furosemide (LASIX) 40 MG tablet   lisinopril (ZESTRIL) 40 MG tablet   Other Relevant Orders   CBC with Differential/Platelet (Completed)   Comprehensive metabolic panel (Completed)   Hemoglobin A1c (Completed)   Lipid panel (Completed)   TSH (Completed)   Microalbumin / creatinine urine ratio (Completed)  Respiratory   Allergic rhinitis    Continue current regimen. Follow up prn.      Relevant Medications   azelastine (ASTELIN) 0.1 % nasal spray     Digestive   Gastroesophageal reflux disease without esophagitis    Can continue prn use of medication for relief.       Relevant Medications   omeprazole (PRILOSEC) 40 MG capsule     Other   HYPERCHOLESTEROLEMIA    Continue statin. Recheck labs today and adjust as indicated.       Relevant Medications   amLODipine (NORVASC) 10 MG tablet   atorvastatin (LIPITOR) 10 MG tablet   furosemide (LASIX) 40 MG tablet   lisinopril (ZESTRIL) 40 MG tablet   Other Relevant Orders   CBC with Differential/Platelet (Completed)   Comprehensive metabolic panel (Completed)   Hemoglobin A1c (Completed)   Lipid panel (Completed)   TSH (Completed)   Microalbumin / creatinine urine ratio (Completed)   GAD (generalized anxiety disorder)    Will start low dose of  seroquel xr at nighttime. Continue wellbutrin. Med check in 2-3 mo      Relevant Medications   buPROPion (WELLBUTRIN XL) 150 MG 24 hr tablet   escitalopram (LEXAPRO) 10 MG tablet   Other Visit Diagnoses     Psychophysiological insomnia    -  Primary   Relevant Medications   QUEtiapine (SEROQUEL XR) 50 MG TB24 24 hr tablet   Memory loss       Relevant Medications   donepezil (ARICEPT) 10 MG tablet   Primary osteoarthritis of both knees       Relevant Medications   HYDROcodone-acetaminophen (NORCO/VICODIN) 5-325 MG tablet   Type 2 diabetes mellitus with stage 3a chronic kidney disease, with long-term current use of insulin (HCC)       Relevant Medications   atorvastatin (LIPITOR) 10 MG tablet   empagliflozin (JARDIANCE) 10 MG TABS tablet   lisinopril (ZESTRIL) 40 MG tablet   Other Relevant Orders   CBC with Differential/Platelet (Completed)   Comprehensive metabolic panel (Completed)   Hemoglobin A1c (Completed)   Lipid panel (Completed)   TSH (Completed)   Microalbumin / creatinine urine ratio (Completed)       Meds ordered this encounter  Medications   amLODipine (NORVASC) 10 MG tablet    Sig: Take 1 tablet (10 mg total) by mouth daily.    Dispense:  90 tablet    Refill:  1    Order Specific Question:   Supervising Provider    Answer:   Carlota Raspberry, JEFFREY R [[id number]]   atorvastatin (LIPITOR) 10 MG tablet    Sig: Take 1 tablet (10 mg total) by mouth daily.    Dispense:  90 tablet    Refill:  3    Order Specific Question:   Supervising Provider    Answer:   Carlota Raspberry, JEFFREY R [[id number]]   azelastine (ASTELIN) 0.1 % nasal spray    Sig: INSTILL 1 SPRAY INTO BOTH NOSTRILS TWICE DAILY    Dispense:  30 mL    Refill:  12    Order Specific Question:   Supervising Provider    Answer:   Carlota Raspberry, JEFFREY R [[id number]]   buPROPion (WELLBUTRIN XL) 150 MG 24 hr tablet    Sig: Take 1 tablet (150 mg total) by mouth daily.    Dispense:  90 tablet    Refill:  1    Order Specific Question:    Supervising Provider    Answer:   Wendie Agreste [  [id number]]   donepezil (ARICEPT) 10 MG tablet    Sig: TAKE 1 TABLET(5 MG) BY MOUTH AT BEDTIME    Dispense:  90 tablet    Refill:  5    **Patient requests 90 days supply**    Order Specific Question:   Supervising Provider    Answer:   Neva Seat, JEFFREY R [[id number]]   escitalopram (LEXAPRO) 10 MG tablet    Sig: Take 1 tablet (10 mg total) by mouth daily.    Dispense:  90 tablet    Refill:  1    Order Specific Question:   Supervising Provider    Answer:   Neva Seat, JEFFREY R [[id number]]   furosemide (LASIX) 40 MG tablet    Sig: Take 1 tablet (40 mg total) by mouth daily.    Dispense:  90 tablet    Refill:  1    Order Specific Question:   Supervising Provider    Answer:   Neva Seat, JEFFREY R [[id number]]   HYDROcodone-acetaminophen (NORCO/VICODIN) 5-325 MG tablet    Sig: Take 1 tablet by mouth every 6 (six) hours as needed for moderate pain. For chronic pain    Dispense:  90 tablet    Refill:  0    Order Specific Question:   Supervising Provider    Answer:   Neva Seat, JEFFREY R [[id number]]   empagliflozin (JARDIANCE) 10 MG TABS tablet    Sig: TAKE 1 TABLET(10 MG) BY MOUTH DAILY BEFORE BREAKFAST    Dispense:  90 tablet    Refill:  0    Order Specific Question:   Supervising Provider    Answer:   Neva Seat, JEFFREY R [[id number]]   lisinopril (ZESTRIL) 40 MG tablet    Sig: TAKE 1 TABLET(40 MG) BY MOUTH DAILY    Dispense:  90 tablet    Refill:  1    Order Specific Question:   Supervising Provider    Answer:   Neva Seat, JEFFREY R [[id number]]   omeprazole (PRILOSEC) 40 MG capsule    Sig: TAKE 1 CAPSULE(40 MG) BY MOUTH DAILY    Dispense:  90 capsule    Refill:  3    Order Specific Question:   Supervising Provider    Answer:   Neva Seat, JEFFREY R [[id number]]   QUEtiapine (SEROQUEL XR) 50 MG TB24 24 hr tablet    Sig: Take 1 tablet (50 mg total) by mouth at bedtime.    Dispense:  90 tablet    Refill:  0    Order Specific Question:   Supervising Provider    Answer:   Neva Seat, JEFFREY  R [[id number]]    Return in about 3 months (around 11/19/2021) for med check.    [name], NP

## 2021-08-19 NOTE — Patient Instructions (Addendum)
Casey [name] -  [name] to see you!  I recommend these providers:  Jarold Motto, PA Jacquiline Doe, MD Edwina Barth, MD Letta Moynahan Early, NP Jiles Prows, DNP Abbe Amsterdam, MD Darlyn Read  I recommend using 1/2 - 1 scoop of miralax daily with water or another beverage to help bowel movements.  Start taking seroquel 1 hour before bed each night. Stop taking if it is too sedative  Thank you!  [name]     If you have lab work done today you will be contacted with your lab results within the next 2 weeks.  If you have not heard from [name] then please contact us. The fastest way to get your results is to register for My Chart.   IF you received an x-ray today, you will receive an invoice from [facility]. Please contact [facility] at [phone] with questions or concerns regarding your invoice.   IF you received labwork today, you will receive an invoice from [facility]. Please contact [facility] at [phone] with questions or concerns regarding your invoice.   Our billing staff will not be able to assist you with questions regarding bills from these companies.  You will be contacted with the lab results as soon as they are available. The fastest way to get your results is to activate your My Chart account. Instructions are located on the last page of this paperwork. If you have not heard from [name] regarding the results in 2 weeks, please contact this office.

## 2021-08-19 NOTE — Assessment & Plan Note (Signed)
Continue current regimen. Follow up prn.

## 2021-08-19 NOTE — Assessment & Plan Note (Signed)
Mildly elevated today. Permissible given pain and stress. Will continue current regimen rather than risking hypotension. Follow up in 3 mo

## 2021-08-19 NOTE — Assessment & Plan Note (Signed)
Can continue prn use of medication for relief.

## 2021-08-20 ENCOUNTER — Encounter: Payer: Self-pay | Admitting: Registered Nurse

## 2021-09-03 ENCOUNTER — Telehealth: Payer: Self-pay

## 2021-09-03 DIAGNOSIS — F411 Generalized anxiety disorder: Secondary | ICD-10-CM

## 2021-09-03 MED ORDER — ALPRAZOLAM 0.25 MG PO TABS: 0.1250 mg | ORAL_TABLET | Freq: Every evening | ORAL | 0 refills | Status: DC | PRN

## 2021-09-03 NOTE — Telephone Encounter (Signed)
Prescription sent to pharmacy.

## 2021-09-04 ENCOUNTER — Ambulatory Visit: Payer: Medicare Other | Admitting: Adult Health

## 2021-09-04 ENCOUNTER — Encounter: Payer: Self-pay | Admitting: Adult Health

## 2021-09-25 ENCOUNTER — Ambulatory Visit: Payer: Medicare Other | Admitting: Obstetrics and Gynecology

## 2021-09-26 ENCOUNTER — Ambulatory Visit (INDEPENDENT_AMBULATORY_CARE_PROVIDER_SITE_OTHER): Payer: Medicare Other | Admitting: Family Medicine

## 2021-09-26 VITALS — BP 150/76 | HR 66 | Temp 97.5°F | Ht 62.0 in | Wt 169.6 lb

## 2021-09-26 DIAGNOSIS — E1169 Type 2 diabetes mellitus with other specified complication: Secondary | ICD-10-CM

## 2021-09-26 DIAGNOSIS — E1159 Type 2 diabetes mellitus with other circulatory complications: Secondary | ICD-10-CM

## 2021-09-26 DIAGNOSIS — E785 Hyperlipidemia, unspecified: Secondary | ICD-10-CM

## 2021-09-26 DIAGNOSIS — Z23 Encounter for immunization: Secondary | ICD-10-CM | POA: Diagnosis not present

## 2021-09-26 DIAGNOSIS — E1129 Type 2 diabetes mellitus with other diabetic kidney complication: Secondary | ICD-10-CM | POA: Diagnosis not present

## 2021-09-26 DIAGNOSIS — H8110 Benign paroxysmal vertigo, unspecified ear: Secondary | ICD-10-CM

## 2021-09-26 DIAGNOSIS — K219 Gastro-esophageal reflux disease without esophagitis: Secondary | ICD-10-CM

## 2021-09-26 DIAGNOSIS — F0390 Unspecified dementia without behavioral disturbance: Secondary | ICD-10-CM

## 2021-09-26 DIAGNOSIS — F321 Major depressive disorder, single episode, moderate: Secondary | ICD-10-CM

## 2021-09-26 DIAGNOSIS — J329 Chronic sinusitis, unspecified: Secondary | ICD-10-CM

## 2021-09-26 DIAGNOSIS — R809 Proteinuria, unspecified: Secondary | ICD-10-CM

## 2021-09-26 DIAGNOSIS — F411 Generalized anxiety disorder: Secondary | ICD-10-CM

## 2021-09-26 DIAGNOSIS — G4700 Insomnia, unspecified: Secondary | ICD-10-CM

## 2021-09-26 DIAGNOSIS — I152 Hypertension secondary to endocrine disorders: Secondary | ICD-10-CM

## 2021-09-26 MED ORDER — PANTOPRAZOLE SODIUM 40 MG PO TBEC: 40.0000 mg | DELAYED_RELEASE_TABLET | Freq: Every day | ORAL | 3 refills | Status: DC

## 2021-09-26 NOTE — Assessment & Plan Note (Signed)
Recent lipids at goal.  Continue atorvastatin 10 mg daily.

## 2021-09-26 NOTE — Assessment & Plan Note (Signed)
Continue medication regimen per previous PCP with Seroquel 50 mg daily, Lexapro 10 mg daily, Wellbutrin 150 mg daily.

## 2021-09-26 NOTE — Progress Notes (Signed)
Casey [name] is a 86 y.o. female who presents today for an office visit.  She is transferring care to this office.   Assessment/Plan:  Chronic Problems Addressed Today: Sinusitis, chronic Still has significant issues with pain and pressure.  She has not responded to multiple rounds of antibiotics.  She has had issues with chronic sinusitis in the past.  We will place referral to ENT.  BENIGN POSITIONAL VERTIGO Still has persistent symptoms.  Her meclizine has not provided much relief.  Could be component of her sinus infection as above though we will refer to ENT for further evaluation and management.  Hypertension associated with diabetes (HCC) Elevated today though she is typically well controlled.  We will continue her current regimen HCTZ 12.5 mg daily, amlodipine 10 mg daily.  And lisinopril 40 mg daily.  GAD (generalized anxiety disorder) We will continue current regimen prescribed by previous PCP with Seroquel 50 mg daily, Wellbutrin 150 mg daily, Lexapro 10 mg daily, and BuSpar 5 mg twice daily.  Dyslipidemia associated with type 2 diabetes mellitus (HCC) Recent lipids at goal.  Continue atorvastatin 10 mg daily.  Type 2 diabetes mellitus with microalbuminuria, without long-term current use of insulin (HCC) Last A1c at goal.  Continue current regimen metformin 500 mg daily and Jardiance 10 mg daily.  Insomnia Previous PCP started her on Seroquel.  This seems to be going well.  We will continue for now.  Depression, major, single episode, moderate (HCC) Continue medication regimen per previous PCP with Seroquel 50 mg daily, Lexapro 10 mg daily, Wellbutrin 150 mg daily.  Dementia without behavioral disturbance (HCC) She is on Aricept 10 mg daily.  Gastroesophageal reflux disease without esophagitis Not controlled on omeprazole 40 mg daily.  We will switch to Protonix.  She will come back in 2 to 3 months to recheck A1c.    Subjective:  HPI:  See A/p for status of  chronic conditions.  Her primary concern today is imbalance. This has been going for over a year. Symptoms have been persistent. She initially had an MRI last year when symptoms started which was normal. She is having headaches constantly. Some ear ache. Vision has been normal. No weakness or numbness. Denies any hearing issues. No syncope.   Her previous PCP has tried a few rounds of antibiotics including amoxicillin and azithromycin which did help. Symptom seem to be worsening.   ROS: Per HPI, otherwise a complete review of systems was negative.   PMH:  The following were reviewed and entered/updated in epic: Past Medical History:  Diagnosis Date   ALLERGIC RHINITIS    Allergy    ANXIETY    ARM PAIN, RIGHT    BENIGN POSITIONAL VERTIGO    Carcinoma in situ of colon    CONSTIPATION    CYSTOCELE WITH INCOMPLETE UTERINE PROLAPSE    Diabetes mellitus without complication (HCC)    ESOPHAGEAL STRICTURE    FATIGUE    FLANK PAIN, LEFT    Headache(784.0)    HIP PAIN, LEFT    HYPERCHOLESTEROLEMIA    HYPERGLYCEMIA    HYPERTENSION, BENIGN ESSENTIAL 03/03/2007   LEG EDEMA, BILATERAL    Microscopic hematuria    OBESITY    OSTEOARTHRITIS, KNEE    OSTEOPOROSIS    Peripheral neuropathy, idiopathic    Problems with swallowing and mastication    SINUSITIS, RECURRENT    SPRAIN&STRAIN OTH SPEC SITES SHOULDER&UPPER ARM    Patient Active Problem List   Diagnosis Date Noted   Depression, major,  single episode, moderate (HCC) 09/26/2021   Insomnia 09/26/2021   Dementia without behavioral disturbance (HCC) 01/03/2020   Gastroesophageal reflux disease without esophagitis 07/17/2019   Vitamin B12 deficiency 07/14/2017   Osteoarthritis 07/07/2013   Type 2 diabetes mellitus with microalbuminuria, without long-term current use of insulin (HCC) 05/26/2012   CYSTOCELE WITH INCOMPLETE UTERINE PROLAPSE 03/10/2010   MICROSCOPIC HEMATURIA 07/29/2009   Sinusitis, chronic 02/23/2008   BENIGN POSITIONAL  VERTIGO 04/25/2007   Dyslipidemia associated with type 2 diabetes mellitus (HCC) 03/03/2007   GAD (generalized anxiety disorder) 03/03/2007   Hypertension associated with diabetes (HCC) 03/03/2007   Allergic rhinitis 03/03/2007   Osteoporosis 03/03/2007   Past Surgical History:  Procedure Laterality Date   COLONOSCOPY     KNEE ARTHROPLASTY Right 07/24/2020   Procedure: COMPUTER ASSISTED TOTAL KNEE ARTHROPLASTY;  Surgeon: Samson Frederic, MD;  Location: [facility];  Service: Orthopedics;  Laterality: Right;   LEFT HEART CATHETERIZATION WITH CORONARY ANGIOGRAM N/A 12/02/2010   Procedure: LEFT HEART CATHETERIZATION WITH CORONARY ANGIOGRAM;  Surgeon: Peter M Swaziland, MD;  Location: [facility] CATH LAB;  Service: Cardiovascular;:: NORMAL CORONARY ARTERIES - NORMAL LV FUNCTION & EDP.     TRANSTHORACIC ECHOCARDIOGRAM  11/2010   Normal EF 60-65%. No RWMA.  Mild LAD dilation c/w Gr 1 DD.  -- PRETTY NORMAL ECHO    Family History  Problem Relation Age of Onset   Coronary artery disease Mother 83   Cancer Father 39   Heart disease Father    Colon cancer Other    Diabetes Other    Coronary artery disease Sister    Diabetes type II Sister    Cancer Sister    Coronary artery disease Brother    Kidney disease Brother    Coronary artery disease Brother    Breast cancer Neg Hx     Medications- reviewed and updated Current Outpatient Medications  Medication Sig Dispense Refill   ALPRAZolam (XANAX) 0.25 MG tablet Take 0.5 tablets (0.125 mg total) by mouth at bedtime as needed for anxiety. 30 tablet 0   amLODipine (NORVASC) 10 MG tablet Take 1 tablet (10 mg total) by mouth daily. 90 tablet 1   aspirin 81 MG chewable tablet Chew 1 tablet (81 mg total) by mouth 2 (two) times daily.     atorvastatin (LIPITOR) 10 MG tablet Take 1 tablet (10 mg total) by mouth daily. 90 tablet 3   azelastine (ASTELIN) 0.1 % nasal spray INSTILL 1 SPRAY INTO BOTH NOSTRILS TWICE DAILY 30 mL 12   azelastine (OPTIVAR) 0.05 % ophthalmic  solution Place 1 drop into both eyes 2 (two) times daily.     buPROPion (WELLBUTRIN XL) 150 MG 24 hr tablet Take 1 tablet (150 mg total) by mouth daily. 90 tablet 1   busPIRone (BUSPAR) 5 MG tablet      Carboxymethylcellul-Glycerin (REFRESH OPTIVE) 1-0.9 % GEL Place 1 drop into both eyes daily.     celecoxib (CELEBREX) 200 MG capsule      cyanocobalamin (VITAMIN B12) 500 MCG tablet      donepezil (ARICEPT) 10 MG tablet TAKE 1 TABLET(5 MG) BY MOUTH AT BEDTIME 90 tablet 5   empagliflozin (JARDIANCE) 10 MG TABS tablet TAKE 1 TABLET(10 MG) BY MOUTH DAILY BEFORE BREAKFAST 90 tablet 0   escitalopram (LEXAPRO) 10 MG tablet Take 1 tablet (10 mg total) by mouth daily. 90 tablet 1   furosemide (LASIX) 40 MG tablet Take 1 tablet (40 mg total) by mouth daily. 90 tablet 1   hydrochlorothiazide (HYDRODIURIL) 12.5 MG  tablet      HYDROcodone-acetaminophen (NORCO/VICODIN) 5-325 MG tablet Take 1 tablet by mouth every 6 (six) hours as needed for moderate pain. For chronic pain 90 tablet 0   hydrOXYzine (ATARAX) 10 MG tablet Take 1 tablet (10 mg total) by mouth 3 (three) times daily as needed. 90 tablet 1   lisinopril (ZESTRIL) 40 MG tablet TAKE 1 TABLET(40 MG) BY MOUTH DAILY 90 tablet 1   loperamide (IMODIUM A-D) 2 MG tablet Take 1 tablet (2 mg total) by mouth 2 (two) times daily as needed for diarrhea or loose stools. 30 tablet 0   meclizine (ANTIVERT) 50 MG tablet Take 1 tablet (50 mg total) by mouth 3 (three) times daily as needed. 30 tablet 0   metFORMIN (GLUCOPHAGE) 500 MG tablet 500 mg by oral route.     ondansetron (ZOFRAN) 4 MG tablet      pantoprazole (PROTONIX) 40 MG tablet Take 1 tablet (40 mg total) by mouth daily. 30 tablet 3   QUEtiapine (SEROQUEL XR) 50 MG TB24 24 hr tablet Take 1 tablet (50 mg total) by mouth at bedtime. 90 tablet 0   No current facility-administered medications for this visit.    Allergies-reviewed and updated No Known Allergies  Social History   Socioeconomic History    Marital status: Single    Spouse name: Not on file   Number of children: 1   Years of education: 12   Highest education level: High school graduate  Occupational History   Not on file  Tobacco Use   Smoking status: Never   Smokeless tobacco: Never  Vaping Use   Vaping Use: Never used  Substance and Sexual Activity   Alcohol use: No   Drug use: No   Sexual activity: Not Currently  Other Topics Concern   Not on file  Social History Narrative   She tries to stay active, but over the last year (20 19-20 20), she has been very limited by her right knee pain.   Is able to go grocery shopping.   Social Determinants of Health   Financial Resource Strain: Low Risk  (12/25/2016)   Overall Financial Resource Strain (CARDIA)    Difficulty of Paying Living Expenses: Not very hard  Food Insecurity: No Food Insecurity (12/25/2016)   Hunger Vital Sign    Worried About Running Out of Food in the Last Year: Never true    Ran Out of Food in the Last Year: Never true  Transportation Needs: No Transportation Needs (12/25/2016)   PRAPARE - Administrator, Civil Service (Medical): No    Lack of Transportation (Non-Medical): No  Physical Activity: Inactive (12/25/2016)   Exercise Vital Sign    Days of Exercise per Week: 0 days    Minutes of Exercise per Session: 0 min  Stress: No Stress Concern Present (12/25/2016)   [employer] of Occupational Health - Occupational Stress Questionnaire    Feeling of Stress : Only a little  Social Connections: Moderately Isolated (12/25/2016)   Social Connection and Isolation Panel [NHANES]    Frequency of Communication with Friends and Family: Once a week    Frequency of Social Gatherings with Friends and Family: Once a week    Attends Religious Services: More than 4 times per year    Active Member of [employer] or Organizations: No    Attends Banker Meetings: Never    Marital Status: Never married             Objective:  Physical Exam: BP (!) 150/76   Pulse 66   Temp (!) 97.5 F (36.4 C) (Temporal)   Ht 5\' 2"  (1.575 m)   Wt 169 lb 9.6 oz (76.9 kg)   SpO2 100%   BMI 31.02 kg/m   Gen: No acute distress, resting comfortably CV: Regular rate and rhythm with no murmurs appreciated Pulm: Normal work of breathing, clear to auscultation bilaterally with no crackles, wheezes, or rhonchi Neuro: Grossly normal, moves all extremities Psych: Normal affect and thought content  Time Spent: 45 minutes of total time was spent on the date of the encounter performing the following actions: chart review prior to seeing the patient including recent visits with previous PCP, obtaining history, performing a medically necessary exam, counseling on the treatment plan, placing orders, and documenting in our EHR.        . [name], MD 09/26/2021 8:25 AM

## 2021-09-26 NOTE — Assessment & Plan Note (Signed)
Previous PCP started her on Seroquel.  This seems to be going well.  We will continue for now.

## 2021-09-26 NOTE — Patient Instructions (Signed)
It was very nice to see you today!  I will refer you to see the ENT.  Please stop the omeprazole and start the pantoprazole.  I will see back in 2 to 3 months.  Come back sooner if needed.  Take care, Dr [name]  PLEASE NOTE:  If you had any lab tests please let us know if you have not heard back within a few days. You may see your results on mychart before we have a chance to review them but we will give you a call once they are reviewed by [name]. If we ordered any referrals today, please let us know if you have not heard from their office within the next week.   Please try these tips to maintain a healthy lifestyle:  Eat at least 3 REAL meals and 1-2 snacks per day.  Aim for no more than 5 hours between eating.  If you eat breakfast, please do so within one hour of getting up.   Each meal should contain half fruits/vegetables, one quarter protein, and one quarter carbs (no bigger than a computer mouse)  Cut down on sweet beverages. This includes juice, soda, and sweet tea.   Drink at least 1 glass of water with each meal and aim for at least 8 glasses per day  Exercise at least 150 minutes every week.

## 2021-09-26 NOTE — Assessment & Plan Note (Signed)
Last A1c at goal.  Continue current regimen metformin 500 mg daily and Jardiance 10 mg daily.

## 2021-09-26 NOTE — Assessment & Plan Note (Signed)
Elevated today though she is typically well controlled.  We will continue her current regimen HCTZ 12.5 mg daily, amlodipine 10 mg daily.  And lisinopril 40 mg daily.

## 2021-09-26 NOTE — Assessment & Plan Note (Signed)
She is on Aricept 10 mg daily.

## 2021-09-26 NOTE — Assessment & Plan Note (Signed)
Still has significant issues with pain and pressure.  She has not responded to multiple rounds of antibiotics.  She has had issues with chronic sinusitis in the past.  We will place referral to ENT.

## 2021-09-26 NOTE — Assessment & Plan Note (Signed)
Not controlled on omeprazole 40 mg daily.  We will switch to Protonix.

## 2021-09-26 NOTE — Assessment & Plan Note (Addendum)
Still has persistent symptoms.  Her meclizine has not provided much relief.  Could be component of her sinus infection as above though we will refer to ENT for further evaluation and management.

## 2021-09-26 NOTE — Assessment & Plan Note (Signed)
We will continue current regimen prescribed by previous PCP with Seroquel 50 mg daily, Wellbutrin 150 mg daily, Lexapro 10 mg daily, and BuSpar 5 mg twice daily.

## 2021-10-03 ENCOUNTER — Telehealth: Payer: Self-pay | Admitting: Family Medicine

## 2021-10-03 NOTE — Telephone Encounter (Signed)
Patient Transferred from Dr. Kateri Plummer (previous prescribed by Dr. Kateri Plummer) to Dr. [name] requests the following refill:   LAST APPOINTMENT DATE:  Please schedule appointment if longer than 1 year  09/26/21  NEXT APPOINTMENT DATE:  MEDICATION:HYDROcodone-acetaminophen (NORCO/VICODIN) 5-325 MG tablet  Is the patient out of medication? Yes  PHARMACY: [facility] [phone] - [place], [place] - [address] AT [address] [place] Phone:  [phone]  Fax:  [phone]      Let patient know to contact pharmacy at the end of the day to make sure medication is ready.  Please notify patient to allow 48-72 hours to process

## 2021-10-06 ENCOUNTER — Other Ambulatory Visit: Payer: Self-pay | Admitting: Family Medicine

## 2021-10-06 DIAGNOSIS — M170 Bilateral primary osteoarthritis of knee: Secondary | ICD-10-CM

## 2021-10-06 NOTE — Telephone Encounter (Signed)
LAST APPOINTMENT DATE:  09/26/21  NEXT APPOINTMENT DATE: None  MEDICATION: HYDROcodone-acetaminophen (NORCO/VICODIN) 5-325 MG tablet  Is the patient out of medication? Yes  PHARMACY: [facility] [phone] - [place], [place] [phone] AT [facility] [address] [place]  [address] [place] [address]  Phone:  [phone]  Fax:  [phone]   Medication was last filled by Maximiano Coss, former PCP. Can this medication be taken over/filled by current PCP?

## 2021-10-06 NOTE — Telephone Encounter (Signed)
Pt made several calls over the weekend regarding refill.   Patient Name: Casey [name] Gender: Female DOB: 01/21/1935 Age: 86 Y 57 M 25 D Return Phone Number: [phone] (Primary) Address: City/ State/ Zip: [place]  [address] Client [place] [facility] [place] Client Site [place] [facility] [place] Night Provider Dimas Chyle- MD Contact Type Call Who Is Calling Patient / Member / Family / Caregiver Call Type Triage / Clinical Relationship To Patient Self Return Phone Number [phone] (Primary) Chief Complaint Prescription Refill or Medication Request (non symptomatic) Reason for Call Medication Question / Request Initial Comment Caller states she was wondering if her prescription has been called in yet. Translation No Disp. Time Eilene Ghazi Time) Disposition Final User 10/04/2021 4:43:24 PM Attempt made - message left Darleen Crocker 10/04/2021 4:59:23 PM FINAL ATTEMPT MADE - no message left Darleen Crocker 10/04/2021 4:59:57 PM Send to RN Final Attempt Royetta Asal, RN, Legrand Como 10/04/2021 11:14:17 PM Attempt made - no message left Scales, RN, Debroah Loop 10/04/2021 11:34:14 PM FINAL ATTEMPT MADE - message left Yes Scales, RN, Debroah Loop Final Disposition 10/04/2021 11:34:14 PM FINAL ATTEMPT MADE - message left

## 2021-10-06 NOTE — Telephone Encounter (Signed)
Patient Name: Casey [name] Gender: Female DOB: 1935-11-20 Age: 86 Y 46 M 24 D Return Phone Number: [phone] (Primary) Address: City/ State/ Zip: [place]  [address] Client [place] [facility] [place] Client Site [place] [facility] [place] Night Provider Dimas Chyle- MD Contact Type Call Who Is Calling Patient / Member / Family / Caregiver Call Type Triage / Clinical Relationship To Patient Self Return Phone Number [phone] (Primary) Chief Complaint Prescription Refill or Medication Request (non symptomatic) Reason for Call Symptomatic / Request for Health Information Initial Comment Called Thursday for a refill, nothing at the pharmacy. Needs refill, no symptoms Translation No Disp. Time Eilene Ghazi Time) Disposition Final User 10/04/2021 1:15:56 PM Attempt made - message left Dew, RN, Levada Dy 10/04/2021 1:15:56 PM Send To Nurse Krista Blue, RN, Barnetta Chapel 10/04/2021 1:17:49 PM Attempt made - no message left Dew, RN, Levada Dy 10/04/2021 1:38:47 PM FINAL ATTEMPT MADE - message left Yes Lucky Cowboy, RN, Levada Dy Final Disposition 10/04/2021 1:38:47 PM FINAL ATTEMPT MADE - message left Yes Dew, RN, Levada Dy

## 2021-10-07 MED ORDER — HYDROCODONE-ACETAMINOPHEN 5-325 MG PO TABS: 1.0000 | ORAL_TABLET | Freq: Four times a day (QID) | ORAL | 0 refills | Status: DC | PRN

## 2021-10-07 NOTE — Telephone Encounter (Signed)
Patient is calling in requesting status update.  Please give patient a call at [phone].

## 2021-10-09 NOTE — Telephone Encounter (Signed)
Prescription was send on 10/07/2021

## 2021-10-27 ENCOUNTER — Telehealth: Payer: Self-pay | Admitting: Family Medicine

## 2021-10-27 ENCOUNTER — Other Ambulatory Visit: Payer: Self-pay | Admitting: *Deleted

## 2021-10-27 MED ORDER — MECLIZINE HCL 25 MG PO TABS: 25.0000 mg | ORAL_TABLET | Freq: Three times a day (TID) | ORAL | 0 refills | Status: DC | PRN

## 2021-10-27 NOTE — Telephone Encounter (Signed)
See note

## 2021-10-27 NOTE — Telephone Encounter (Signed)
Ok to send in meclizine 25mg  tid prn dizziness.   Algis Greenhouse. Jerline Pain, MD 10/27/2021 12:53 PM

## 2021-10-27 NOTE — Telephone Encounter (Signed)
Rx send to [facility]  Patient notified  

## 2021-10-27 NOTE — Telephone Encounter (Signed)
Patient states she is unable to see Neurologist until 11/13/21.  Patient requests a RX for her dizzy spells and severe headache until Patient can see above Specialist.  Pharmacy: [facility] [place], [place] [address] AT [place] Phone: [phone]  Fax: [phone]

## 2021-10-29 ENCOUNTER — Other Ambulatory Visit: Payer: Self-pay | Admitting: Family Medicine

## 2021-10-29 DIAGNOSIS — M170 Bilateral primary osteoarthritis of knee: Secondary | ICD-10-CM

## 2021-10-29 NOTE — Telephone Encounter (Signed)
Patient requests: - An answer from PCP or PCP team on how often she can get medication below filled.  - A refill of hydrocone-acetaminophen (see below)  I informed patient she should ask pharmacy but she stated the pharmacy informed her to ask PCP office.    LAST APPOINTMENT DATE:  09/26/21  NEXT APPOINTMENT DATE: None   MEDICATION: HYDROcodone-acetaminophen (NORCO/VICODIN) 5-325 MG tablet   Is the patient out of medication? Has 5-6 tabs left, takes one every 6 hours  PHARMACY: [facility] [phone] - [place], [place] AT [address] [place], [place] [address] Phone: [phone]  Fax: [phone]

## 2021-10-30 MED ORDER — HYDROCODONE-ACETAMINOPHEN 5-325 MG PO TABS: 1.0000 | ORAL_TABLET | Freq: Four times a day (QID) | ORAL | 0 refills | Status: DC | PRN

## 2021-11-07 ENCOUNTER — Other Ambulatory Visit: Payer: Self-pay | Admitting: Family Medicine

## 2021-11-07 DIAGNOSIS — F5104 Psychophysiologic insomnia: Secondary | ICD-10-CM

## 2021-11-10 NOTE — Telephone Encounter (Signed)
Last refilled 2 months ago (08/19/2021) by Maximiano Coss, NP Last OV 09/26/2021

## 2021-11-21 ENCOUNTER — Other Ambulatory Visit: Payer: Self-pay | Admitting: Family Medicine

## 2021-11-21 DIAGNOSIS — F411 Generalized anxiety disorder: Secondary | ICD-10-CM

## 2021-11-21 NOTE — Telephone Encounter (Signed)
  LAST APPOINTMENT DATE:   09/26/21 TOC with PCP  NEXT APPOINTMENT DATE: N/A  MEDICATION: ALPRAZolam (XANAX) 0.25 MG tablet [[id number]]   Is the patient out of medication?  Yes, first missed dose 11/19/21.  PHARMACY: [facility] [place], [place] [address] AT [place], [place] [address] Phone: [phone]  Fax: [phone] DEA #: [id number]

## 2021-11-22 ENCOUNTER — Encounter (HOSPITAL_COMMUNITY): Payer: Self-pay | Admitting: Emergency Medicine

## 2021-11-22 ENCOUNTER — Other Ambulatory Visit: Payer: Self-pay

## 2021-11-22 ENCOUNTER — Emergency Department (HOSPITAL_COMMUNITY)
Admission: EM | Admit: 2021-11-22 | Discharge: 2021-11-23 | Disposition: A | Discharge status: Home / Self Care | Payer: Medicare Other | Attending: Emergency Medicine | Admitting: Emergency Medicine

## 2021-11-22 ENCOUNTER — Emergency Department (HOSPITAL_COMMUNITY): Payer: Medicare Other

## 2021-11-22 DIAGNOSIS — Z7984 Long term (current) use of oral hypoglycemic drugs: Secondary | ICD-10-CM | POA: Diagnosis not present

## 2021-11-22 DIAGNOSIS — Z7982 Long term (current) use of aspirin: Secondary | ICD-10-CM | POA: Insufficient documentation

## 2021-11-22 DIAGNOSIS — E876 Hypokalemia: Secondary | ICD-10-CM | POA: Diagnosis not present

## 2021-11-22 DIAGNOSIS — Z79899 Other long term (current) drug therapy: Secondary | ICD-10-CM | POA: Insufficient documentation

## 2021-11-22 DIAGNOSIS — D72829 Elevated white blood cell count, unspecified: Secondary | ICD-10-CM | POA: Diagnosis not present

## 2021-11-22 DIAGNOSIS — E119 Type 2 diabetes mellitus without complications: Secondary | ICD-10-CM | POA: Diagnosis not present

## 2021-11-22 DIAGNOSIS — H8111 Benign paroxysmal vertigo, right ear: Secondary | ICD-10-CM | POA: Diagnosis not present

## 2021-11-22 DIAGNOSIS — I10 Essential (primary) hypertension: Secondary | ICD-10-CM | POA: Insufficient documentation

## 2021-11-22 DIAGNOSIS — R42 Dizziness and giddiness: Secondary | ICD-10-CM | POA: Diagnosis present

## 2021-11-22 LAB — CBC WITH DIFFERENTIAL/PLATELET
Abs Immature Granulocytes: 0.06 10*3/uL (ref 0.00–0.07)
Basophils Absolute: 0.1 10*3/uL (ref 0.0–0.1)
Basophils Relative: 1 %
Eosinophils Absolute: 0.1 10*3/uL (ref 0.0–0.5)
Eosinophils Relative: 1 %
HCT: 36.3 % (ref 36.0–46.0)
Hemoglobin: 12.2 g/dL (ref 12.0–15.0)
Immature Granulocytes: 0 %
Lymphocytes Relative: 10 %
Lymphs Abs: 1.6 10*3/uL (ref 0.7–4.0)
MCH: 28.2 pg (ref 26.0–34.0)
MCHC: 33.6 g/dL (ref 30.0–36.0)
MCV: 84 fL (ref 80.0–100.0)
Monocytes Absolute: 0.9 10*3/uL (ref 0.1–1.0)
Monocytes Relative: 6 %
Neutro Abs: 12.6 10*3/uL — ABNORMAL HIGH (ref 1.7–7.7)
Neutrophils Relative %: 82 %
Platelets: 309 10*3/uL (ref 150–400)
RBC: 4.32 MIL/uL (ref 3.87–5.11)
RDW: 16.4 % — ABNORMAL HIGH (ref 11.5–15.5)
WBC: 15.4 10*3/uL — ABNORMAL HIGH (ref 4.0–10.5)
nRBC: 0 % (ref 0.0–0.2)

## 2021-11-22 LAB — BASIC METABOLIC PANEL
Anion gap: 10 (ref 5–15)
BUN: 16 mg/dL (ref 8–23)
CO2: 25 mmol/L (ref 22–32)
Calcium: 9.3 mg/dL (ref 8.9–10.3)
Chloride: 102 mmol/L (ref 98–111)
Creatinine, Ser: 1.11 mg/dL — ABNORMAL HIGH (ref 0.44–1.00)
GFR, Estimated: 49 mL/min — ABNORMAL LOW (ref >60)
Glucose, Bld: 177 mg/dL — ABNORMAL HIGH (ref 70–99)
Potassium: 3.1 mmol/L — ABNORMAL LOW (ref 3.5–5.1)
Sodium: 137 mmol/L (ref 135–145)

## 2021-11-22 NOTE — ED Triage Notes (Signed)
Pt brought to ED with c/o "swimming in the head", pt state she has a  hx of chronic dizziness but it is accompanied by nausea today.

## 2021-11-22 NOTE — ED Notes (Signed)
Pt called X2 for triage. Pt could not be found.  

## 2021-11-22 NOTE — ED Provider Triage Note (Signed)
Emergency Medicine Provider Triage Evaluation Note  Casey [name] , a 86 y.o. female  was evaluated in triage.  Pt complains of posterior headache and vomiting.  This started earlier today.  No reported falls or injuries.  No chest pain, abdominal pain.  Patient states that it started rather abruptly.  No confusion, weakness in the arms of the legs.  Review of Systems  Positive: Headache, vomiting Negative:   Physical Exam  BP (!) 146/61 (BP Location: Right Arm)   Pulse 85   Temp 97.7 F (36.5 C) (Oral)   Resp 18   SpO2 99%  Gen:   Awake, no distress   Resp:  Normal effort  MSK:   Moves extremities without difficulty  Other:  Holding emesis bag, speech clear   Medical Decision Making  Medically screening exam initiated at 8:32 PM.  Appropriate orders placed.  Jeralyn Ruths was informed that the remainder of the evaluation will be completed by another provider, this initial triage assessment does not replace that evaluation, and the importance of remaining in the ED until their evaluation is complete.     Carlisle Cater, PA-C 11/22/21 2034

## 2021-11-23 DIAGNOSIS — H8111 Benign paroxysmal vertigo, right ear: Secondary | ICD-10-CM | POA: Diagnosis not present

## 2021-11-23 MED ORDER — DIAZEPAM 2 MG PO TABS
2.0000 mg | ORAL_TABLET | Freq: Once | ORAL | Status: AC
  Administered 2021-11-23: 2 mg via ORAL
  Filled 2021-11-23: qty 1

## 2021-11-23 MED ORDER — DIAZEPAM 2 MG PO TABS: 2.0000 mg | ORAL_TABLET | Freq: Three times a day (TID) | ORAL | 0 refills | Status: DC | PRN

## 2021-11-23 NOTE — Discharge Instructions (Addendum)
Follow-up with physical therapy as planned.  They should be calling you to set up an appointment.  Continue using the new nasal spray.  You were given a prescription for some Valium that you can use if your symptoms become severe like they did yesterday but you should not be taking this regularly.

## 2021-11-23 NOTE — ED Provider Notes (Signed)
[facility] EMERGENCY DEPARTMENT Provider Note   CSN: [id number] Arrival date & time: 11/22/21  1854     History  Chief Complaint  Patient presents with   Dizziness    Casey [name] is a 86 y.o. female.  Patient is a very pleasant 86 year old female with history of hypertension, diabetes, chronic vertigo and anxiety who is presenting today due to severe vertigo and vomiting.  Patient reports that for years now she has felt dizzy every day.  It does seem to be worse in the morning when she gets out of bed or changes positions.  She also has chronic nasal congestion and a feeling of phlegm in her throat.  In the past she has had MRIs of the brain which have been negative she recently saw an ENT last week who gave her a new nasal spray and have since scheduled her for physical therapy for vertigo.  She reports that yesterday she had gone to the store and after coming home around 8:00 she just started to feel much more dizzy than usual and very nauseated.  She broke out in a sweat and felt very hot and had to lay down on the bed.  She called her family members but then she started having multiple episodes of vomiting.  That is when they brought her to the emergency room.  She has unfortunately waited for 14 hours prior to being seen but reports now her typical dizziness is still present but it feels more like her normal dizziness.  She is no longer having any nausea.  She also reports feeling very anxious which is an ongoing issue.  She has had no recent change in her medications other than the new nasal spray.  She has not had any fever, cough, chest pain, palpitations or shortness of breath.  She always has to steady herself when she walks but is been no different.  The history is provided by the patient.  Dizziness      Home Medications Prior to Admission medications   Medication Sig Start Date End Date Taking? Authorizing Provider  diazepam (VALIUM) 2 MG tablet Take 1  tablet (2 mg total) by mouth every 8 (eight) hours as needed (for dizziness). 11/23/21  Yes Netanel Yannuzzi, Loree Fee, MD  ALPRAZolam Duanne Moron) 0.25 MG tablet Take 0.5 tablets (0.125 mg total) by mouth at bedtime as needed for anxiety. 09/03/21   Midge Minium, MD  amLODipine (NORVASC) 10 MG tablet Take 1 tablet (10 mg total) by mouth daily. 08/19/21   Maximiano Coss, NP  aspirin 81 MG chewable tablet Chew 1 tablet (81 mg total) by mouth 2 (two) times daily. 07/25/20   Dorothyann Peng, PA-C  atorvastatin (LIPITOR) 10 MG tablet Take 1 tablet (10 mg total) by mouth daily. 08/19/21   Maximiano Coss, NP  azelastine (ASTELIN) 0.1 % nasal spray INSTILL 1 SPRAY INTO BOTH NOSTRILS TWICE DAILY 08/19/21   Maximiano Coss, NP  azelastine (OPTIVAR) 0.05 % ophthalmic solution Place 1 drop into both eyes 2 (two) times daily.    [provider]  buPROPion (WELLBUTRIN XL) 150 MG 24 hr tablet Take 1 tablet (150 mg total) by mouth daily. 08/19/21   Maximiano Coss, NP  busPIRone (BUSPAR) 5 MG tablet     [provider]  Carboxymethylcellul-Glycerin (REFRESH OPTIVE) 1-0.9 % GEL Place 1 drop into both eyes daily.    [provider]  celecoxib (CELEBREX) 200 MG capsule     [provider]  cyanocobalamin (VITAMIN  B12) 500 MCG tablet     [provider]  donepezil (ARICEPT) 10 MG tablet TAKE 1 TABLET(5 MG) BY MOUTH AT BEDTIME 08/19/21   [name], NP  empagliflozin (JARDIANCE) 10 MG TABS tablet TAKE 1 TABLET(10 MG) BY MOUTH DAILY BEFORE BREAKFAST 08/19/21   [name], NP  escitalopram (LEXAPRO) 10 MG tablet Take 1 tablet (10 mg total) by mouth daily. 08/19/21   [name], NP  furosemide (LASIX) 40 MG tablet Take 1 tablet (40 mg total) by mouth daily. 08/19/21   [name], NP  hydrochlorothiazide (HYDRODIURIL) 12.5 MG tablet     [provider]  HYDROcodone-acetaminophen (NORCO/VICODIN) 5-325 MG tablet Take 1 tablet by mouth every 6 (six) hours as needed for  moderate pain. For chronic pain 10/30/21   Ardith Dark, MD  hydrOXYzine (ATARAX) 10 MG tablet Take 1 tablet (10 mg total) by mouth 3 (three) times daily as needed. 06/05/21   [name], NP  lisinopril (ZESTRIL) 40 MG tablet TAKE 1 TABLET(40 MG) BY MOUTH DAILY 08/19/21   [name], NP  loperamide (IMODIUM A-D) 2 MG tablet Take 1 tablet (2 mg total) by mouth 2 (two) times daily as needed for diarrhea or loose stools. 04/17/21   [name], NP  meclizine (ANTIVERT) 25 MG tablet Take 1 tablet (25 mg total) by mouth 3 (three) times daily as needed for dizziness. 10/27/21   Ardith Dark, MD  metFORMIN (GLUCOPHAGE) 500 MG tablet 500 mg by oral route.    [provider]  ondansetron (ZOFRAN) 4 MG tablet     [provider]  pantoprazole (PROTONIX) 40 MG tablet Take 1 tablet (40 mg total) by mouth daily. 09/26/21   Ardith Dark, MD  QUEtiapine (SEROQUEL XR) 50 MG TB24 24 hr tablet TAKE 1 TABLET(50 MG) BY MOUTH AT BEDTIME 11/11/21   Ardith Dark, MD      Allergies    Patient has no known allergies.    Review of Systems   Review of Systems  Neurological:  Positive for dizziness.    Physical Exam Updated Vital Signs BP (!) 157/66 (BP Location: Right Arm)   Pulse 87   Temp 98.4 F (36.9 C)   Resp 17   SpO2 100%  Physical Exam Vitals and nursing note reviewed.  Constitutional:      General: She is not in acute distress.    Appearance: She is well-developed.  HENT:     Head: Normocephalic and atraumatic.     Nose: Congestion present.     Mouth/Throat:     Mouth: Mucous membranes are moist.  Eyes:     General: No visual field deficit.    Extraocular Movements: Extraocular movements intact.     Pupils: Pupils are equal, round, and reactive to light.     Comments: No notable nystagmus  Cardiovascular:     Rate and Rhythm: Normal rate and regular rhythm.     Heart sounds: Normal heart sounds. No murmur heard.    No friction rub.  Pulmonary:      Effort: Pulmonary effort is normal.     Breath sounds: Normal breath sounds. No wheezing or rales.  Abdominal:     General: Bowel sounds are normal. There is no distension.     Palpations: Abdomen is soft.     Tenderness: There is no abdominal tenderness. There is no guarding or rebound.  Musculoskeletal:        General: No tenderness. Normal range of motion.  Comments: No edema  Skin:    General: Skin is warm and dry.     Findings: No rash.  Neurological:     Mental Status: She is alert and oriented to person, place, and time.     Cranial Nerves: No cranial nerve deficit, dysarthria or facial asymmetry.     Sensory: Sensation is intact.     Motor: No weakness or pronator drift.     Coordination: Coordination normal. Finger-Nose-Finger Test and Heel to Shin Test normal.  Psychiatric:        Behavior: Behavior normal.     ED Results / Procedures / Treatments   Labs (all labs ordered are listed, but only abnormal results are displayed) Labs Reviewed  CBC WITH DIFFERENTIAL/PLATELET - Abnormal; Notable for the following components:      Result Value   WBC 15.4 (*)    RDW 16.4 (*)    Neutro Abs 12.6 (*)    All other components within normal limits  BASIC METABOLIC PANEL - Abnormal; Notable for the following components:   Potassium 3.1 (*)    Glucose, Bld 177 (*)    Creatinine, Ser 1.11 (*)    GFR, Estimated 49 (*)    All other components within normal limits    EKG EKG Interpretation  Date/Time:  Sunday November 23 2021 09:07:04 EST Ventricular Rate:  71 PR Interval:  165 QRS Duration: 134 QT Interval:  555 QTC Calculation: 604 R Axis:   -40 Text Interpretation: Sinus rhythm Probable left atrial enlargement Nonspecific IVCD with LAD Left ventricular hypertrophy Inferior infarct, old Probable anterior infarct, age indeterminate No significant change since last tracing Confirmed by Gwyneth Sprout ([id number]) on 11/23/2021 9:20:16 AM  Radiology CT HEAD WO CONTRAST  ( )  Result Date: 11/22/2021 CLINICAL DATA:  Chronic dizziness. EXAM: CT HEAD WITHOUT CONTRAST TECHNIQUE: Contiguous axial images were obtained from the base of the skull through the vertex without intravenous contrast. RADIATION DOSE REDUCTION: This exam was performed according to the departmental dose-optimization program which includes automated exposure control, adjustment of the mA and/or kV according to patient size and/or use of iterative reconstruction technique. COMPARISON:  September 23, 2020 FINDINGS: Brain: There is mild cerebral atrophy with widening of the extra-axial spaces and ventricular dilatation. There are areas of decreased attenuation within the white matter tracts of the supratentorial brain, consistent with microvascular disease changes. Vascular: No hyperdense vessel or unexpected calcification. Skull: Normal. Negative for fracture or focal lesion. Sinuses/Orbits: There is mild bilateral ethmoid sinus mucosal thickening. Other: None. IMPRESSION: 1. No acute intracranial abnormality. 2. Generalized cerebral atrophy and microvascular disease changes of the supratentorial brain. Electronically Signed   By: Aram Candela M.D.   On: 11/22/2021 21:06    Procedures Procedures    Medications Ordered in ED Medications  diazepam (VALIUM) tablet 2 mg (2 mg Oral Given 11/23/21 0913)    ED Course/ Medical Decision Making/ A&P                           Medical Decision Making Amount and/or Complexity of Data Reviewed External Data Reviewed: notes.    Details: ENT Labs: ordered. Decision-making details documented in ED Course. Radiology: ordered and independent interpretation performed. Decision-making details documented in ED Course. ECG/medicine tests: ordered and independent interpretation performed. Decision-making details documented in ED Course.  Risk Prescription drug management.   Pt with multiple medical problems and comorbidities and presenting today with a  complaint that caries a high  risk for morbidity and mortality.  Here today for worsening dizziness and vomiting at 8 PM last night.  Patient has been seen multiple times for this dizziness.  She is recently seen an otolaryngologist who did Dix-Hallpike maneuvers which were positive towards the right and has set her up for physical therapy for Epley maneuvers which does improve her dizziness.  Patient has tried Antivert in the past and reports that has never helped with her dizziness.  She reports dizziness has been present for years.  She did recently start a new nasal spray as some is thought to be related to congestion and has increased her PPI.  She reports she came today just because her dizziness was much worse last night and caused her to have emesis.  She unfortunately waited for a prolonged period of time and reports now her dizziness feels more to her baseline daily dizziness.  She is no longer having nausea.  Her symptoms are not classic for stroke, new infectious symptoms, dysrhythmia or syncope.  I independently interpreted patient's labs and EKG.  EKG shows a sinus rhythm without acute changes.  Labs do show a leukocytosis of 15,000 with stable hemoglobin and BMP with mild hypokalemia of 3.1 but stable creatinine.  Feel the leukocytosis may have been from her repetitive vomiting that occurred at home as patient is not febrile and has not had any recent infectious symptoms. I have independently visualized and interpreted pt's images today.  CT of the head was negative for bleed.  Radiology reports no acute findings today.  Patient last had MRI less than a year ago that showed no acute findings or cerebellar strokes.  At this time we will attempt to symptom control patient but do not feel she would benefit for further imaging.  No focal findings on exam to suggest new stroke.  Will ensure patient is able to ambulate.  10:42 AM Pt reports dizziness is much improved after 2mg  of valium.  She was able to  ambulate without ataxia and reports she would like to go home.  She was given a small prescription of valium to uses for severe dizziness but was encouraged to f/u with PT as planned.  Based on the narcotic database patient does fill hydrocodone monthly for a pain contract but has not had alprazolam for months.  It was discussed in depth that she should not mix the Valium with the hydrocodone and it should not be taken regularly but only when she has more severe vertiginous symptoms.          Final Clinical Impression(s) / ED Diagnoses Final diagnoses:  Benign paroxysmal positional vertigo of right ear    Rx / DC Orders ED Discharge Orders          Ordered    diazepam (VALIUM) 2 MG tablet  Every 8 hours PRN        11/23/21 1042              13/05/23, MD 11/23/21 1042

## 2021-11-24 MED ORDER — ALPRAZOLAM 0.25 MG PO TABS: 0.1250 mg | ORAL_TABLET | Freq: Every evening | ORAL | 0 refills | Status: DC | PRN

## 2021-11-24 NOTE — Telephone Encounter (Signed)
Patient requests to be called at ph# [phone] for status of RX request

## 2021-11-26 ENCOUNTER — Ambulatory Visit: Payer: Medicare Other | Admitting: Podiatry

## 2021-11-26 ENCOUNTER — Other Ambulatory Visit: Payer: Self-pay | Admitting: *Deleted

## 2021-11-26 MED ORDER — MECLIZINE HCL 25 MG PO TABS: 25.0000 mg | ORAL_TABLET | Freq: Three times a day (TID) | ORAL | 0 refills | Status: DC | PRN

## 2021-12-01 ENCOUNTER — Other Ambulatory Visit: Payer: Self-pay | Admitting: Family Medicine

## 2021-12-01 DIAGNOSIS — M170 Bilateral primary osteoarthritis of knee: Secondary | ICD-10-CM

## 2021-12-01 NOTE — Telephone Encounter (Signed)
  LAST APPOINTMENT DATE:  Please schedule appointment if longer than 1 year 09/26/21  NEXT APPOINTMENT DATE:  MEDICATION: HYDROcodone-acetaminophen (NORCO/VICODIN) 5-325 MG tablet   Is the patient out of medication? Yes  PHARMACY:  [facility] [phone] - [place], [place] - [address] AT [facility] [address] [place] Phone: [phone]  Fax: [phone]     Let patient know to contact pharmacy at the end of the day to make sure medication is ready.  Please notify patient to allow 48-72 hours to process

## 2021-12-02 MED ORDER — HYDROCODONE-ACETAMINOPHEN 5-325 MG PO TABS: 1.0000 | ORAL_TABLET | Freq: Four times a day (QID) | ORAL | 0 refills | Status: DC | PRN

## 2021-12-17 IMAGING — MG MM DIGITAL SCREENING BILAT W/ TOMO AND CAD
6 of 12 series · 6 of 36 positions shown · non-contrast
Comparison: Previous exam(s).

CLINICAL DATA: Screening.

EXAM:
DIGITAL SCREENING BILATERAL MAMMOGRAM WITH TOMOSYNTHESIS AND CAD
TECHNIQUE: Bilateral screening digital craniocaudal and mediolateral oblique
mammograms were obtained. Bilateral screening digital breast
tomosynthesis was performed. The images were evaluated with
computer-aided detection.

[R CC synth-2D (1 of 2)]
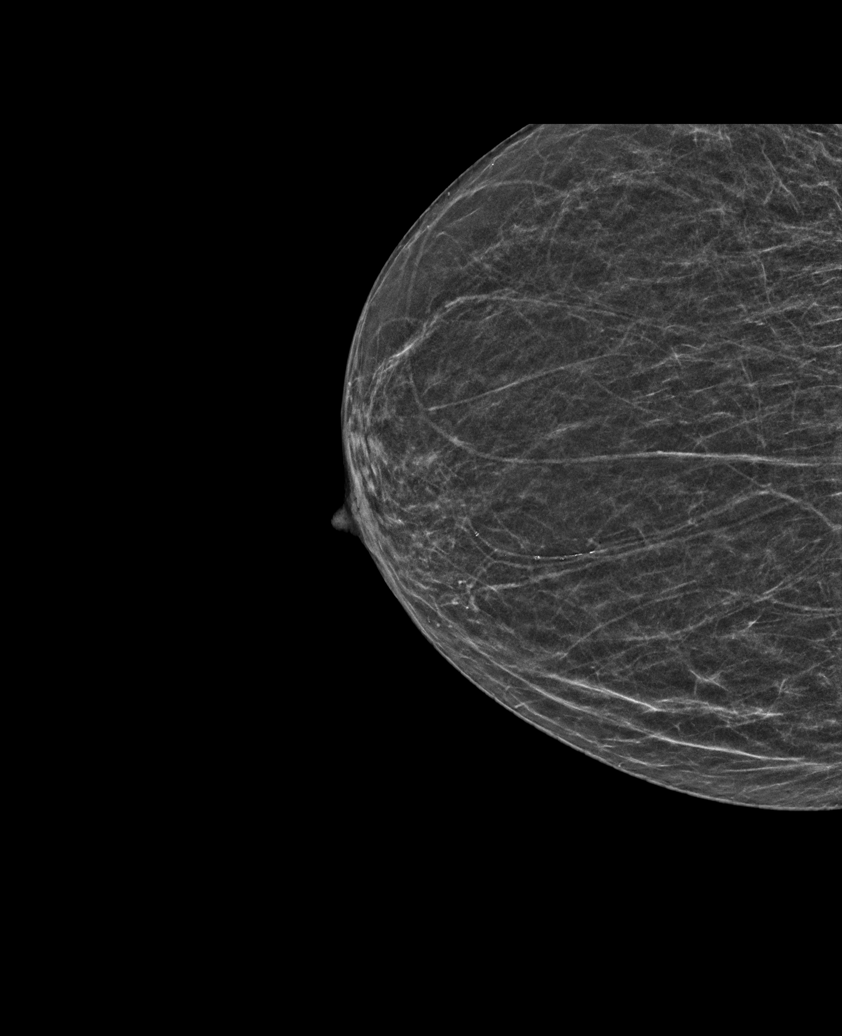

[L CC synth-2D (1 of 2)]
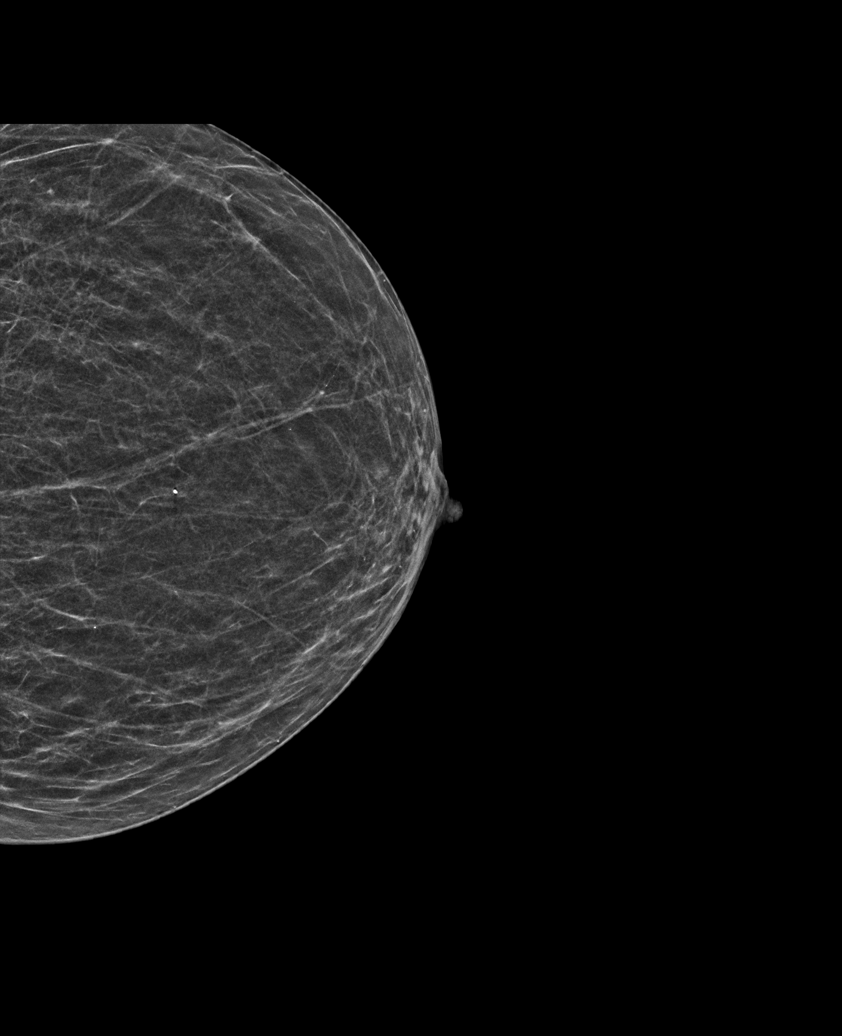

[L CC synth-2D (2 of 2)]
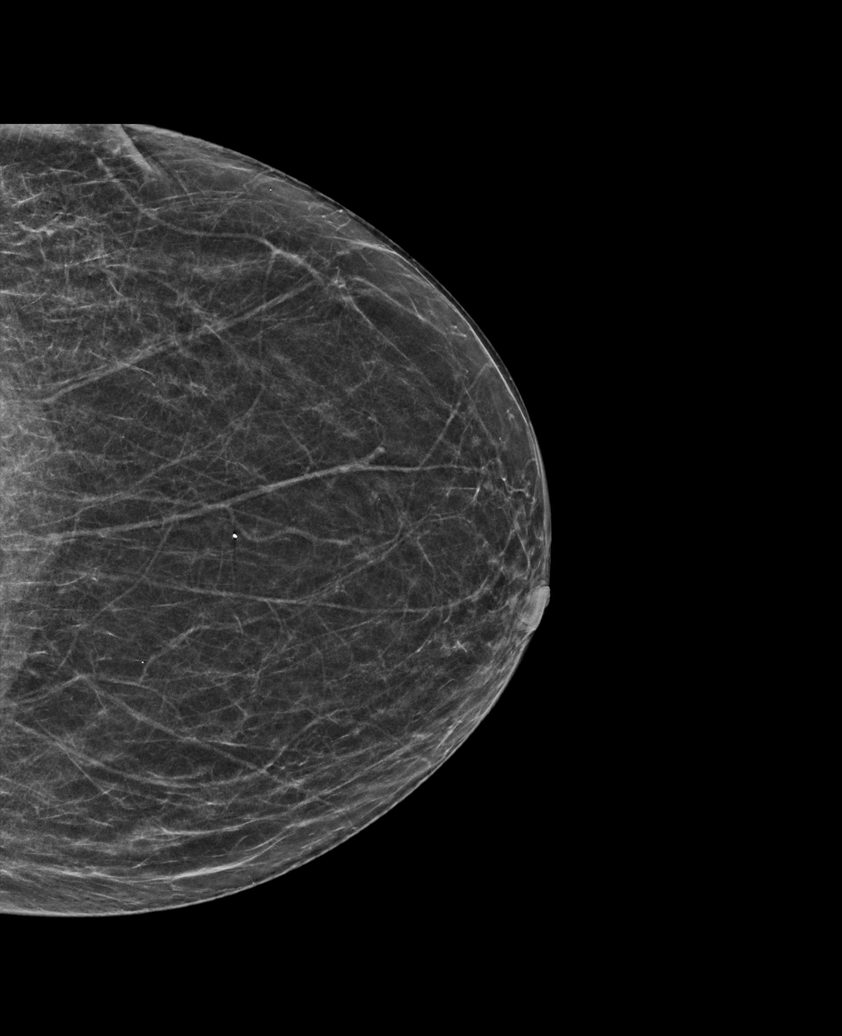

[R CC synth-2D (2 of 2)]
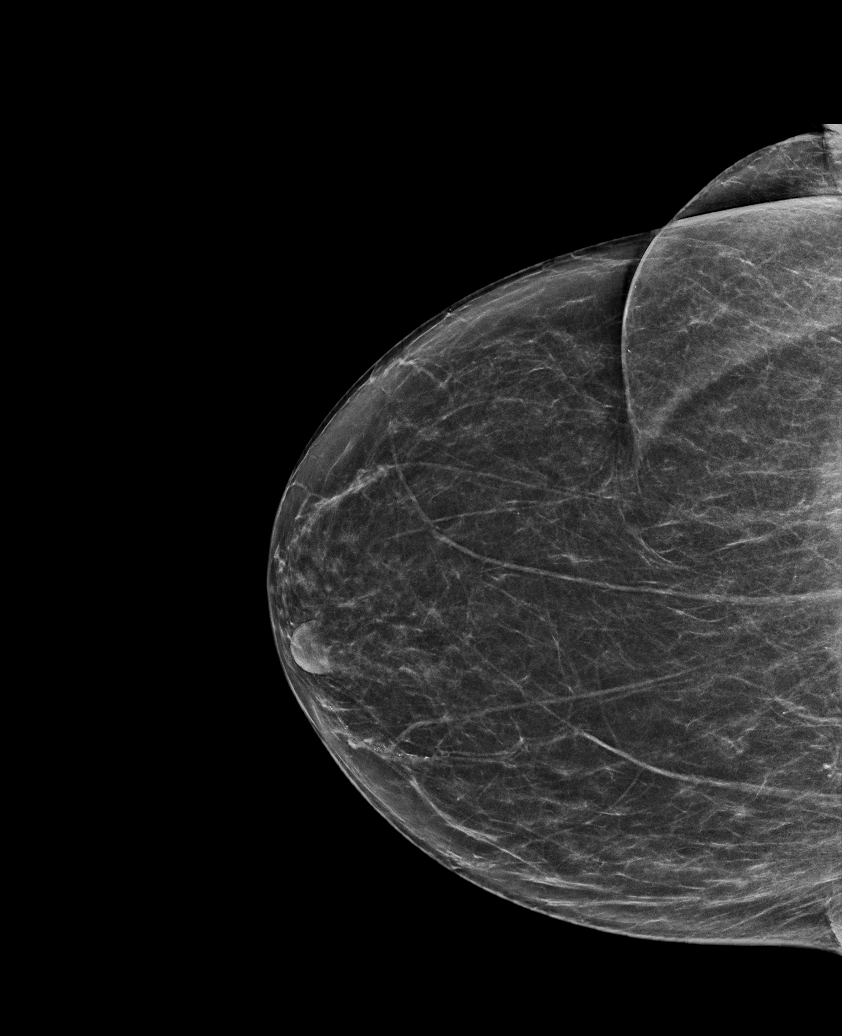

[L MLO synth-2D]
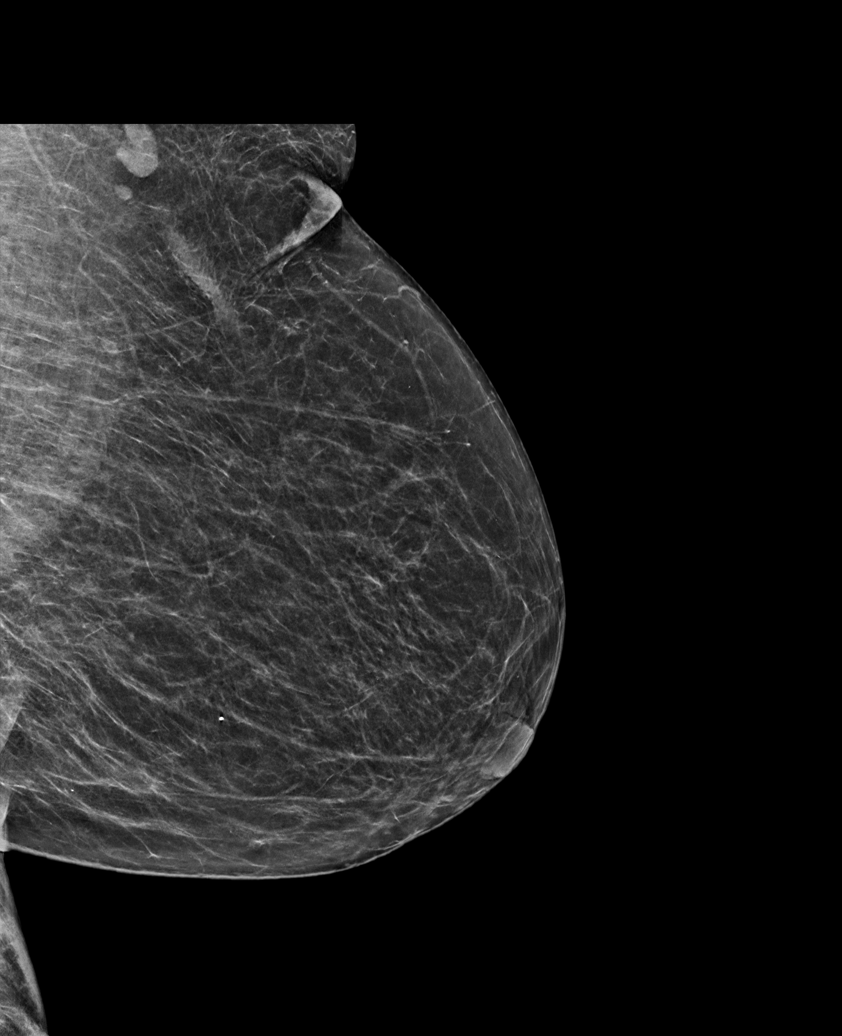

[R MLO synth-2D]
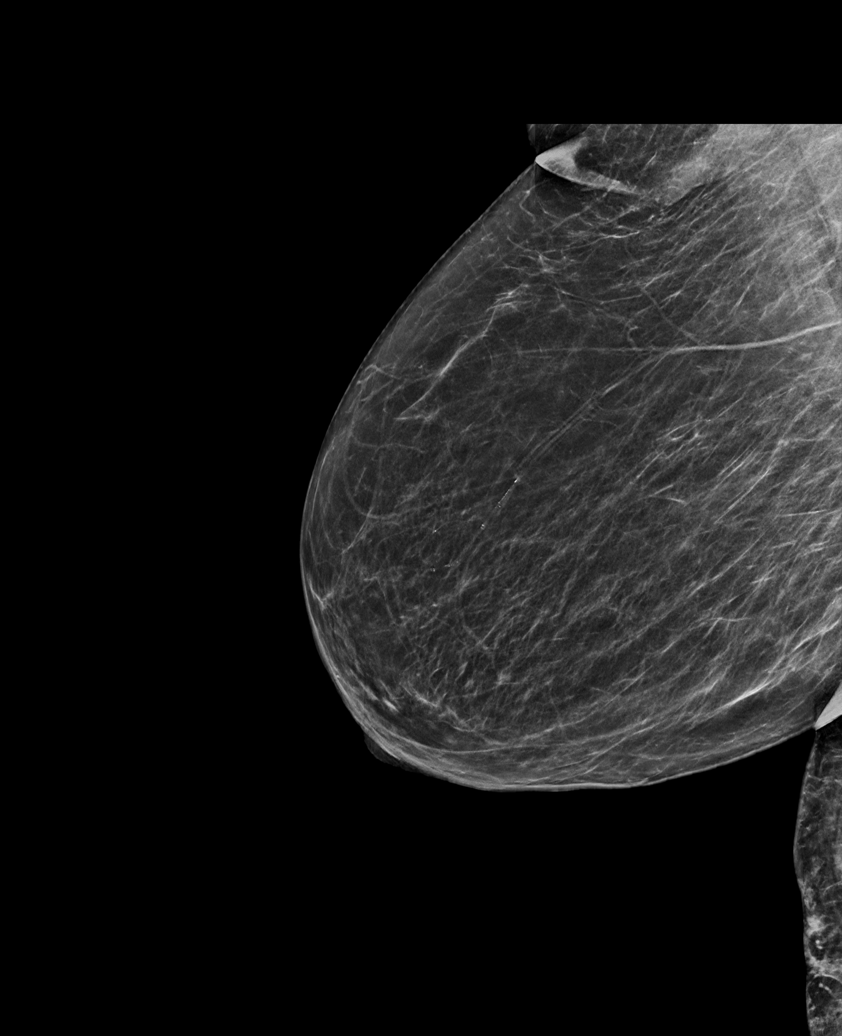

[6 of 36 positions shown; findings below may reference images not displayed]

ACR Breast Density Category b: There are scattered areas of
fibroglandular density.
FINDINGS: There are no findings suspicious for malignancy.
IMPRESSION: No mammographic evidence of malignancy. A result letter of this
screening mammogram will be mailed directly to the patient.

RECOMMENDATION:
Screening mammogram in one year. (Code:51-O-LD2)

BI-RADS CATEGORY  1: Negative.

## 2021-12-26 ENCOUNTER — Encounter: Payer: Self-pay | Admitting: Family Medicine

## 2021-12-26 ENCOUNTER — Ambulatory Visit (INDEPENDENT_AMBULATORY_CARE_PROVIDER_SITE_OTHER): Payer: Medicare Other | Admitting: Family Medicine

## 2021-12-26 VITALS — BP 130/74 | HR 79 | Temp 97.8°F | Ht 62.0 in | Wt 167.6 lb

## 2021-12-26 DIAGNOSIS — I152 Hypertension secondary to endocrine disorders: Secondary | ICD-10-CM

## 2021-12-26 DIAGNOSIS — E1129 Type 2 diabetes mellitus with other diabetic kidney complication: Secondary | ICD-10-CM

## 2021-12-26 DIAGNOSIS — G8929 Other chronic pain: Secondary | ICD-10-CM | POA: Diagnosis not present

## 2021-12-26 DIAGNOSIS — R809 Proteinuria, unspecified: Secondary | ICD-10-CM | POA: Diagnosis not present

## 2021-12-26 DIAGNOSIS — M170 Bilateral primary osteoarthritis of knee: Secondary | ICD-10-CM

## 2021-12-26 DIAGNOSIS — F321 Major depressive disorder, single episode, moderate: Secondary | ICD-10-CM

## 2021-12-26 DIAGNOSIS — J329 Chronic sinusitis, unspecified: Secondary | ICD-10-CM | POA: Diagnosis not present

## 2021-12-26 DIAGNOSIS — F411 Generalized anxiety disorder: Secondary | ICD-10-CM

## 2021-12-26 DIAGNOSIS — E1159 Type 2 diabetes mellitus with other circulatory complications: Secondary | ICD-10-CM

## 2021-12-26 LAB — POCT GLYCOSYLATED HEMOGLOBIN (HGB A1C): Hemoglobin A1C: 7.3 % — AB (ref 4.0–5.6)

## 2021-12-26 MED ORDER — AZITHROMYCIN 250 MG PO TABS: ORAL_TABLET | ORAL | 0 refills | Status: DC

## 2021-12-26 MED ORDER — HYDROCODONE-ACETAMINOPHEN 5-325 MG PO TABS: 1.0000 | ORAL_TABLET | Freq: Three times a day (TID) | ORAL | 0 refills | Status: DC | PRN

## 2021-12-26 MED ORDER — HYDROXYZINE HCL 10 MG PO TABS: 10.0000 mg | ORAL_TABLET | Freq: Three times a day (TID) | ORAL | 1 refills | Status: DC | PRN

## 2021-12-26 NOTE — Assessment & Plan Note (Signed)
Will take benzos off her medication list.  Will need to avoid going forward given her chronic narcotic use.  Will continue her current regimen Seroquel 50 mg daily can Wellbutrin 150 mg daily, Lexapro 10 mg daily and BuSpar 5 mg twice daily.  She is still having some issues with frequent panic attacks and anxiety.  Will place referral to psychiatry for further evaluation and management.

## 2021-12-26 NOTE — Assessment & Plan Note (Addendum)
Blood pressure at goal today on current regimen HCTZ 12.5 mg daily, amlodipine 10 mg daily and lisinopril 40 mg daily.

## 2021-12-26 NOTE — Assessment & Plan Note (Signed)
She is on Norco 5-3 25 3-4 times daily as needed.  This was prescribed by her previous PCPs for management of her chronic pain secondary to osteoarthritis.  She has been tolerating well without any significant side effects.  We did discuss with patient potential for dependency and side effects including constipation and respiratory depression.  Also discussed mental status changes as well as potential for development of tolerance and dependency.  Database was reviewed without any red flags.  Medications do help with ability stay focused and on task.  She is not interested in any other aggressive management options for her osteoarthritis at this point.  We will refill her Norco.  She will come back in 3 months for medication recheck.  Will avoid benzos going forward due to her being on chronic narcotics.

## 2021-12-26 NOTE — Progress Notes (Signed)
Casey [name] is a 86 y.o. female who presents today for an office visit.  Assessment/Plan:  Chronic Problems Addressed Today: Type 2 diabetes mellitus with microalbuminuria, without long-term current use of insulin (HCC) A1c today 7.3.  Continue current regimen metformin 500 mg daily and Jardiance 10 mg daily.  Come back in 3 months to recheck A1c.  Chronic pain She is on Norco 5-3 25 3-4 times daily as needed.  This was prescribed by her previous PCPs for management of her chronic pain secondary to osteoarthritis.  She has been tolerating well without any significant side effects.  We did discuss with patient potential for dependency and side effects including constipation and respiratory depression.  Also discussed mental status changes as well as potential for development of tolerance and dependency.  Database was reviewed without any red flags.  Medications do help with ability stay focused and on task.  She is not interested in any other aggressive management options for her osteoarthritis at this point.  We will refill her Norco.  She will come back in 3 months for medication recheck.  Will avoid benzos going forward due to her being on chronic narcotics.  Depression, major, single episode, moderate (HCC) Symptoms are overall stable.  No reported SI or HI.  Will continue her regimen per previous PCP with Seroquel 50 mg daily, Lexapro 10 mg daily, and Wellbutrin 150 mg daily.  Sinusitis, chronic Given her prolonged issues for the last several weeks as well as physical exam findings today we will give a round of antibiotics today.  Start azithromycin.  She has been well with this in the past.  Will defer further management to ENT.  Hypertension associated with diabetes (HCC) Blood pressure at goal today on current regimen HCTZ 12.5 mg daily, amlodipine 10 mg daily and lisinopril 40 mg daily.  GAD (generalized anxiety disorder) Will take benzos off her medication list.  Will need to  avoid going forward given her chronic narcotic use.  Will continue her current regimen Seroquel 50 mg daily can Wellbutrin 150 mg daily, Lexapro 10 mg daily and BuSpar 5 mg twice daily.  She is still having some issues with frequent panic attacks and anxiety.  Will place referral to psychiatry for further evaluation and management.     Subjective:  HPI:  See A/p for status of chronic conditions.    Patient was last seen 3 months ago for an establish care visit.  At her last visit we discussed multiple issues including her diabetes, hypertension, chronic pain, and vertigo.  Will refer her to ENT for her ongoing vertigo issues.  We continued her other chronic prescriptions.  Since our last visit she has been seen by ENT for BPPV.  Had CT scan which was unremarkable.  She has not yet followed back up with ENT.  She is still concerned about frequent sinus infection.  Feels a lot of pressure and drainage.  This has been going on for weeks.  No fevers or chills.  She has thick green mucus production.       Objective:  Physical Exam: BP 130/74   Pulse 79   Temp 97.8 F (36.6 C) (Temporal)   Ht 5\' 2"  (1.575 m)   Wt 167 lb 9.6 oz (76 kg)   SpO2 100%   BMI 30.65 kg/m   Gen: No acute distress, resting comfortably HEENT: TMs clear bilaterally.  OP erythematous.  Nasal mucosa erythematous and boggy with thick discharge CV: Regular rate and rhythm with  no murmurs appreciated Pulm: Normal work of breathing, clear to auscultation bilaterally with no crackles, wheezes, or rhonchi Neuro: Grossly normal, moves all extremities Psych: Normal affect and thought content      Johnta Couts M. [name], MD 12/26/2021 10:36 AM

## 2021-12-26 NOTE — Assessment & Plan Note (Signed)
Symptoms are overall stable.  No reported SI or HI.  Will continue her regimen per previous PCP with Seroquel 50 mg daily, Lexapro 10 mg daily, and Wellbutrin 150 mg daily.

## 2021-12-26 NOTE — Assessment & Plan Note (Signed)
A1c today 7.3.  Continue current regimen metformin 500 mg daily and Jardiance 10 mg daily.  Come back in 3 months to recheck A1c.

## 2021-12-26 NOTE — Patient Instructions (Addendum)
It was very nice to see you today!  I will refill your medications today.  Your blood pressure and blood sugar look good today.  Please start the antibiotic.  I will refer you to see a psychiatrist.  Come back in 3 months.  Come back sooner if needed.  Take care, Dr [name]  PLEASE NOTE:  If you had any lab tests please let us know if you have not heard back within a few days. You may see your results on mychart before we have a chance to review them but we will give you a call once they are reviewed by [name]. If we ordered any referrals today, please let us know if you have not heard from their office within the next week.   Please try these tips to maintain a healthy lifestyle:  Eat at least 3 REAL meals and 1-2 snacks per day.  Aim for no more than 5 hours between eating.  If you eat breakfast, please do so within one hour of getting up.   Each meal should contain half fruits/vegetables, one quarter protein, and one quarter carbs (no bigger than a computer mouse)  Cut down on sweet beverages. This includes juice, soda, and sweet tea.   Drink at least 1 glass of water with each meal and aim for at least 8 glasses per day  Exercise at least 150 minutes every week.

## 2021-12-26 NOTE — Assessment & Plan Note (Signed)
Given her prolonged issues for the last several weeks as well as physical exam findings today we will give a round of antibiotics today.  Start azithromycin.  She has been well with this in the past.  Will defer further management to ENT.

## 2022-01-12 ENCOUNTER — Other Ambulatory Visit: Payer: Self-pay

## 2022-01-12 ENCOUNTER — Emergency Department (HOSPITAL_COMMUNITY)
Admission: EM | Admit: 2022-01-12 | Discharge: 2022-01-12 | Disposition: A | Discharge status: Home / Self Care | Payer: Medicare Other | Attending: Emergency Medicine | Admitting: Emergency Medicine

## 2022-01-12 ENCOUNTER — Emergency Department (HOSPITAL_COMMUNITY): Payer: Medicare Other

## 2022-01-12 ENCOUNTER — Encounter (HOSPITAL_COMMUNITY): Payer: Self-pay | Admitting: *Deleted

## 2022-01-12 DIAGNOSIS — R079 Chest pain, unspecified: Secondary | ICD-10-CM

## 2022-01-12 DIAGNOSIS — E119 Type 2 diabetes mellitus without complications: Secondary | ICD-10-CM | POA: Diagnosis not present

## 2022-01-12 DIAGNOSIS — R0789 Other chest pain: Secondary | ICD-10-CM | POA: Diagnosis present

## 2022-01-12 LAB — CBC WITH DIFFERENTIAL/PLATELET
Abs Immature Granulocytes: 0.03 10*3/uL (ref 0.00–0.07)
Basophils Absolute: 0.1 10*3/uL (ref 0.0–0.1)
Basophils Relative: 1 %
Eosinophils Absolute: 0.2 10*3/uL (ref 0.0–0.5)
Eosinophils Relative: 2 %
HCT: 34.2 % — ABNORMAL LOW (ref 36.0–46.0)
Hemoglobin: 11.8 g/dL — ABNORMAL LOW (ref 12.0–15.0)
Immature Granulocytes: 0 %
Lymphocytes Relative: 26 %
Lymphs Abs: 1.9 10*3/uL (ref 0.7–4.0)
MCH: 28.6 pg (ref 26.0–34.0)
MCHC: 34.5 g/dL (ref 30.0–36.0)
MCV: 82.8 fL (ref 80.0–100.0)
Monocytes Absolute: 0.6 10*3/uL (ref 0.1–1.0)
Monocytes Relative: 8 %
Neutro Abs: 4.7 10*3/uL (ref 1.7–7.7)
Neutrophils Relative %: 63 %
Platelets: 307 10*3/uL (ref 150–400)
RBC: 4.13 MIL/uL (ref 3.87–5.11)
RDW: 14.5 % (ref 11.5–15.5)
WBC: 7.5 10*3/uL (ref 4.0–10.5)
nRBC: 0 % (ref 0.0–0.2)

## 2022-01-12 LAB — LIPASE, BLOOD: Lipase: 39 U/L (ref 11–51)

## 2022-01-12 LAB — COMPREHENSIVE METABOLIC PANEL
ALT: 12 U/L (ref 0–44)
AST: 24 U/L (ref 15–41)
Albumin: 4.1 g/dL (ref 3.5–5.0)
Alkaline Phosphatase: 77 U/L (ref 38–126)
Anion gap: 11 (ref 5–15)
BUN: 5 mg/dL — ABNORMAL LOW (ref 8–23)
CO2: 22 mmol/L (ref 22–32)
Calcium: 8.9 mg/dL (ref 8.9–10.3)
Chloride: 97 mmol/L — ABNORMAL LOW (ref 98–111)
Creatinine, Ser: 1.07 mg/dL — ABNORMAL HIGH (ref 0.44–1.00)
GFR, Estimated: 51 mL/min — ABNORMAL LOW (ref >60)
Glucose, Bld: 186 mg/dL — ABNORMAL HIGH (ref 70–99)
Potassium: 2.9 mmol/L — ABNORMAL LOW (ref 3.5–5.1)
Sodium: 130 mmol/L — ABNORMAL LOW (ref 135–145)
Total Bilirubin: 0.5 mg/dL (ref 0.3–1.2)
Total Protein: 6.8 g/dL (ref 6.5–8.1)

## 2022-01-12 LAB — TROPONIN I (HIGH SENSITIVITY)
Troponin I (High Sensitivity): <2 ng/L (ref <18)
Troponin I (High Sensitivity): 3 ng/L (ref <18)

## 2022-01-12 MED ORDER — HYDROXYZINE HCL 25 MG PO TABS
25.0000 mg | ORAL_TABLET | Freq: Once | ORAL | Status: AC
  Administered 2022-01-12: 25 mg via ORAL
  Filled 2022-01-12: qty 1

## 2022-01-12 MED ORDER — KETOROLAC TROMETHAMINE 15 MG/ML IJ SOLN
15.0000 mg | Freq: Once | INTRAMUSCULAR | Status: AC
  Administered 2022-01-12: 15 mg via INTRAVENOUS
  Filled 2022-01-12: qty 1

## 2022-01-12 NOTE — ED Triage Notes (Signed)
Pt is here with left sided chest with radiation to left shoulder and arm.  Started as pressure and then turn to sharp/stabbing pain.  Pt has had 325mg  ASA and 1 NTG.  Pt reported headache and having issues with balance. Lives by self and uses cane.  PT was 121/60 and SR 72, CBG, 84 per EMS.

## 2022-01-12 NOTE — ED Provider Notes (Signed)
[facility] EMERGENCY DEPARTMENT Provider Note   CSN: [id number] Arrival date & time: 01/12/22  7322     History Chief Complaint  Patient presents with   Chest Pain    HPI Casey [name] is a 86 y.o. female presenting for chief complaint of chest pain.  She states that last night at 10 PM she started feeling left-sided chest pain radiating into her left shoulder.  She denies fevers or chills, nausea vomiting, syncope or shortness of breath.  She otherwise ambulatory tolerating p.o. intake.  At its peak the pain was 10 out of 10 but it is beginning to subset.  Was given aspirin and nitro with EMS prior to arrival.  No history of cardiac disease.  Does have a history of diabetes.  No family history of cardiac disease and patient does not smoke. She endorses a history of anxiety and states that the holidays are always really hard for her.  She thinks that some of her symptoms this morning are related to her anxiety and being alone for Christmas.  Patient's recorded medical, surgical, social, medication list and allergies were reviewed in the Snapshot window as part of the initial history.   Review of Systems   Review of Systems  Constitutional:  Negative for chills and fever.  HENT:  Negative for ear pain and sore throat.   Eyes:  Negative for pain and visual disturbance.  Respiratory:  Negative for cough and shortness of breath.   Cardiovascular:  Positive for chest pain. Negative for palpitations.  Gastrointestinal:  Negative for abdominal pain and vomiting.  Genitourinary:  Negative for dysuria and hematuria.  Musculoskeletal:  Negative for arthralgias and back pain.  Skin:  Negative for color change and rash.  Neurological:  Negative for seizures and syncope.  All other systems reviewed and are negative.   Physical Exam Updated Vital Signs BP 130/65   Pulse 60   Temp 98.5 F (36.9 C) (Oral)   Resp 14   SpO2 95%  Physical Exam Vitals and nursing note  reviewed.  Constitutional:      General: She is not in acute distress.    Appearance: She is well-developed.  HENT:     Head: Normocephalic and atraumatic.  Eyes:     Conjunctiva/sclera: Conjunctivae normal.  Cardiovascular:     Rate and Rhythm: Normal rate and regular rhythm.     Heart sounds: No murmur heard. Pulmonary:     Effort: Pulmonary effort is normal. No respiratory distress.     Breath sounds: Normal breath sounds.  Abdominal:     Palpations: Abdomen is soft.     Tenderness: There is no abdominal tenderness.  Musculoskeletal:        General: No swelling.     Cervical back: Neck supple.  Skin:    General: Skin is warm and dry.     Capillary Refill: Capillary refill takes less than 2 seconds.  Neurological:     Mental Status: She is alert.  Psychiatric:        Mood and Affect: Mood normal.      ED Course/ Medical Decision Making/ A&P    Procedures Procedures   Medications Ordered in ED Medications  hydrOXYzine (ATARAX) tablet 25 mg (has no administration in time range)  ketorolac (TORADOL) 15 MG/ML injection 15 mg (15 mg Intravenous Given 01/12/22 0955)   Medical Decision Making: JARYAH ARACENA is a 86 y.o. female who presented to the ED today with chest pain, detailed above.  Based on patient's comorbidities, patient has a heart score of 3.    Handoff received from EMS.  Patient's presentation is complicated by their history of multiple comorbid medical conditions, advanced age.  Patient placed on continuous vitals and telemetry monitoring while in ED which was reviewed periodically.  Complete initial physical exam performed, notably the patient was dynamically stable in no acute distress.   Reviewed and confirmed nursing documentation for past medical history, family history, social history.    Initial Assessment: With the patient's presentation of left-sided chest pain, most likely diagnosis is musculoskeletal chest pain versus GERD, although ACS remains  on the differential. Other diagnoses were considered including (but not limited to) pulmonary embolism, community-acquired pneumonia, aortic dissection, pneumothorax, underlying bony abnormality, anemia. These are considered less likely due to history of present illness and physical exam findings.    In particular, concerning pulmonary embolism: Patient is lying malignancy, recent surgery, history of DVT, or calf tenderness leading to a low risk Wells score. Aortic Dissection also reconsidered but seems less likely based on the location, quality, onset, and severity of symptoms in this case. Patient has a lack of serious comorbidities for this condition including a lack of HTN or Smoking. Patient also has a lack of underlying history of AD or TAA.  This is most consistent with an acute life/limb threatening illness complicated by underlying chronic conditions.   Initial Plan: Evaluate for ACS with deltatroponin and EKG evaluated as below  Evaluate for dissection, bony abnormality, or pneumonia with chest x-ray and screening laboratory evaluation including CBC, BMP  Further evaluation for pulmonary embolism not indicated at this time ased on patient's PERC and Wells score.  Further evaluation for Thoracic Aortic Dissection not indicated at this time based on patient's clinical history and PE findings.   Initial Study Results: EKG was reviewed independently. Rate, rhythm, axis, intervals all examined and without medically relevant abnormality. ST segments without concerns for elevations.    Laboratory  Delta  troponin demonstrated no acute abnormalities  CBC and BMP without obvious metabolic or inflammatory abnormalities requiring further evaluation   Radiology  DG CHEST PORT 1 VIEW  Result Date: 01/12/2022 CLINICAL DATA:  Chest pain EXAM: PORTABLE CHEST 1 VIEW COMPARISON:  06/06/2013 FINDINGS: Eventration of the right diaphragm. Normal heart size and mediastinal contours. No acute infiltrate or  edema. No effusion or pneumothorax. No acute osseous findings. Generalized degenerative endplate spurring which is bulky. IMPRESSION: No evidence of active disease. Electronically Signed   By: Tiburcio Pea M.D.   On: 01/12/2022 08:17    Final Assessment and Plan: Patient's history of present illness and physical exam findings are most consistent with nonspecific chest pain.  Episode now resolved.  Objective evaluation grossly reassuring.  Patient feels that her symptoms are more so related to her anxiety and being home alone over the holidays. She denies any acute suicidal ideation.  She is overall well-appearing in no acute distress.  Objective evaluation reassuring at this time.  Patient stable for outpatient care management with reassessment with PCP within 48 hours.  Strict return precautions reinforced given her age and she expressed understanding.  Disposition:  I have considered need for hospitalization, however, considering all of the above, I believe this patient is stable for discharge at this time.  Patient/family educated about specific return precautions for given chief complaint and symptoms.  Patient/family educated about follow-up with PCP.     Patient/family expressed understanding of return precautions and need for follow-up. Patient spoken to  regarding all imaging and laboratory results and appropriate follow up for these results. All education provided in verbal form with additional information in written form. Time was allowed for answering of patient questions. Patient discharged.    Emergency Department Medication Summary:   Medications  hydrOXYzine (ATARAX) tablet 25 mg (has no administration in time range)  ketorolac (TORADOL) 15 MG/ML injection 15 mg (15 mg Intravenous Given 01/12/22 0955)                  Clinical Impression:  1. Chest pain, unspecified type      Discharge   Final Clinical Impression(s) / ED Diagnoses Final diagnoses:  Chest pain,  unspecified type    Rx / DC Orders ED Discharge Orders     None         Glyn Ade, MD 01/12/22 1133

## 2022-01-13 ENCOUNTER — Telehealth: Payer: Self-pay

## 2022-01-13 ENCOUNTER — Other Ambulatory Visit: Payer: Self-pay | Admitting: Family Medicine

## 2022-01-13 DIAGNOSIS — M170 Bilateral primary osteoarthritis of knee: Secondary | ICD-10-CM

## 2022-01-13 NOTE — Telephone Encounter (Signed)
Pt states current rx is not helping & is asking for an alternative. Please advise.  MEDICATION:  hydrOXYzine (ATARAX) 10 MG tablet

## 2022-01-13 NOTE — Telephone Encounter (Signed)
Transition Care Management Follow-up Telephone Call Date of discharge and from where: 01/12/2022 from [facility] How have you been since you were released from the hospital? Patient stated that she is feeling better and not experiencing any chest pains. Patient stated that she feels like she is having tremors and then describes them as her "nerves". Patient denies any chest pain, shortness of breath or having any difficulty breathing. Patient requested an appointment to discuss her "nerves" and feeling anxious. Appt has been scheduled.  Any questions or concerns? No  Items Reviewed: Did the pt receive and understand the discharge instructions provided? Yes  Medications obtained and verified? Yes  Other? No  Any new allergies since your discharge? No  Dietary orders reviewed? No Do you have support at home? Yes   Functional Questionnaire: (I = Independent and D = Dependent) ADLs: I  Bathing/Dressing- I  Meal Prep- I  Eating- I  Maintaining continence- I  Transferring/Ambulation- I  Managing Meds- I   Follow up appointments reviewed:  PCP Hospital f/u appt confirmed? Yes  Scheduled to see Jacquiline Doe, MD on 01/26/2022 @ 10:40am. Specialist Hospital f/u appt confirmed? No   Are transportation arrangements needed? No  If their condition worsens, is the pt aware to call PCP or go to the Emergency Dept.? Yes Was the patient provided with contact information for the PCP's office or ED? Yes Was to pt encouraged to call back with questions or concerns? Yes

## 2022-01-13 NOTE — Telephone Encounter (Signed)
We referred her to see a psychiatrist at her last office visit.  Can we check to see if she has not been scheduled as of yet? It is ok for her to increase her dose of hydroxyzine to 20 mg 3 times daily as needed for anxiety.  This should be more effective than the dose that she is on now.

## 2022-01-21 NOTE — Telephone Encounter (Signed)
Spoke with patient, stated still have not heard form a psychiatrist appointment yet  Advise per Dr Jerline Pain to increase Hydroxyzine to 20mg  3 time daily as needed for anxiety, verbalized understanding  Requesting Rx Hydrocodone refill

## 2022-01-22 MED ORDER — HYDROCODONE-ACETAMINOPHEN 5-325 MG PO TABS: 1.0000 | ORAL_TABLET | Freq: Three times a day (TID) | ORAL | 0 refills | Status: DC | PRN

## 2022-01-22 NOTE — Telephone Encounter (Signed)
Patient wants a refill for HYDROcodone-acetaminophen (NORCO/VICODIN) 5-325 MG tablet   Patient is aware dr Jerline Pain is out today. Patient states she is in pain - patient has an OV with dr Jerline Pain on 01/26/22- patient aware

## 2022-01-26 ENCOUNTER — Ambulatory Visit (INDEPENDENT_AMBULATORY_CARE_PROVIDER_SITE_OTHER): Payer: Medicare Other | Admitting: Family Medicine

## 2022-01-26 VITALS — BP 156/79 | HR 68 | Temp 98.4°F | Ht 62.0 in | Wt 172.0 lb

## 2022-01-26 DIAGNOSIS — I152 Hypertension secondary to endocrine disorders: Secondary | ICD-10-CM

## 2022-01-26 DIAGNOSIS — F411 Generalized anxiety disorder: Secondary | ICD-10-CM

## 2022-01-26 DIAGNOSIS — F321 Major depressive disorder, single episode, moderate: Secondary | ICD-10-CM | POA: Diagnosis not present

## 2022-01-26 DIAGNOSIS — E1159 Type 2 diabetes mellitus with other circulatory complications: Secondary | ICD-10-CM

## 2022-01-26 DIAGNOSIS — F5104 Psychophysiologic insomnia: Secondary | ICD-10-CM

## 2022-01-26 DIAGNOSIS — E1129 Type 2 diabetes mellitus with other diabetic kidney complication: Secondary | ICD-10-CM

## 2022-01-26 DIAGNOSIS — J329 Chronic sinusitis, unspecified: Secondary | ICD-10-CM

## 2022-01-26 DIAGNOSIS — R809 Proteinuria, unspecified: Secondary | ICD-10-CM

## 2022-01-26 MED ORDER — AZITHROMYCIN 250 MG PO TABS: ORAL_TABLET | ORAL | 0 refills | Status: DC

## 2022-01-26 MED ORDER — QUETIAPINE FUMARATE ER 50 MG PO TB24: 100.0000 mg | ORAL_TABLET | Freq: Every day | ORAL | 0 refills | Status: DC

## 2022-01-26 NOTE — Progress Notes (Signed)
Casey [name] is a 87 y.o. female who presents today for an office visit.  Assessment/Plan:  New/Acute Problems: Chest Pain She has not had any recurrence over the last couple weeks since being in the ED.  She had an ACS rule out in the ED which was reassuring however she does have several significant risk factors for coronary artery disease including type 2 diabetes, hypertension, and dyslipidemia as well as her advanced age.  She has seen cardiologist in the past and had a stress test done about 3 years ago which was reassuring.  Recommended referral to have her follow back up with a cardiologist for further evaluation however she declined for now.  She will let us know if she has any recurrence of symptoms.  We discussed reasons to seek emergent care.  Chronic Problems Addressed Today: Depression, major, single episode, moderate (HCC) Symptoms are overall stable.  No reported SI or HI.  Stable PHQ score today however she does admit that symptoms seem to be worsening recently.  We referred her to psychiatry at her last visit however she has not yet heard back from them.  Will increase her Seroquel to 100 mg daily.  Will continue Lexapro 10 mg daily and Wellbutrin 150 mg daily.  Hopefully she will be able to see psychiatry soon.  GAD (generalized anxiety disorder) Symptoms are overall stable though still bothersome.  Will be increasing her Seroquel as above to 100 mg daily.  Continue Lexapro 10 mg daily and BuSpar 5 mg twice daily.  We did place referral to psychiatry about a month ago however she has not yet heard back.  Will check on status of this referral.  Sinusitis, chronic Will give another round of antibiotics today.  She is still having some mild persistent symptoms.  She will need to follow-up with ENT if this continues to be an issue.  Hypertension associated with diabetes (HCC) Slightly above goal today though typically well-controlled.  Will continue regimen HCTZ 12.5 mg daily,  amlodipine 10 mg daily, and lisinopril 40 mg daily.  Type 2 diabetes mellitus with microalbuminuria, without long-term current use of insulin (HCC) Last A1c 7.3.  She will really recheck today.  She will come back in 2 to 3 months and we can recheck at that time.  Continue metformin 500 mg daily and Jardiance 10 mg daily.     Subjective:  HPI:  Patient here for ED follow up. She went to the ED a couple of weeks ago with chest pain that had been going on for about a day. She became concerned and called EMS. EMS took her to the ED. In the ED had cardiac work up including labs and EKG which was reassuring.  Troponin was negative x 2.  She was given a dose of toradol and hydroxyzine and she was discharged home after her chest pain resolved.   She has not had any issues with chest pain since then. She has done relatively well the last couple of weeks. She does admit she has been a little bit more anxious recently.  No reported SI or HI.       Objective:  Physical Exam: BP (!) 156/79   Pulse 68   Temp 98.4 F (36.9 C) (Temporal)   Ht 5\' 2"  (1.575 m)   Wt 172 lb (78 kg)   SpO2 98%   BMI 31.46 kg/m   Gen: No acute distress, resting comfortably CV: Regular rate and rhythm with no murmurs appreciated Pulm: Normal  work of breathing, clear to auscultation bilaterally with no crackles, wheezes, or rhonchi Neuro: Grossly normal, moves all extremities Psych: Normal affect and thought content  Time Spent: 55 minutes of total time was spent on the date of the encounter performing the following actions: chart review prior to seeing the patient including her recent ED visit, obtaining history, performing a medically necessary exam, counseling on the treatment plan, placing orders, and documenting in our EHR.        Algis Greenhouse. Jerline Pain, MD 01/26/2022 11:33 AM

## 2022-01-26 NOTE — Assessment & Plan Note (Signed)
Symptoms are overall stable.  No reported SI or HI.  Stable PHQ score today however she does admit that symptoms seem to be worsening recently.  We referred her to psychiatry at her last visit however she has not yet heard back from them.  Will increase her Seroquel to 100 mg daily.  Will continue Lexapro 10 mg daily and Wellbutrin 150 mg daily.  Hopefully she will be able to see psychiatry soon.

## 2022-01-26 NOTE — Patient Instructions (Addendum)
It was very nice to see you today!  I am glad you are feeling better.  Please keep on your BP and let us know if it is persistently 140/90 or higher.  Please increase your seroquel to 100mg  daily.  We will send in an antibiotic for your sinuses.  Please let us know if you need a referral to see the cardiologist.   Take care, Dr Jerline Pain  PLEASE NOTE:  If you had any lab tests, please let us know if you have not heard back within a few days. You may see your results on mychart before we have a chance to review them but we will give you a call once they are reviewed by [name].   If we ordered any referrals today, please let us know if you have not heard from their office within the next week.   If you had any urgent prescriptions sent in today, please check with the pharmacy within an hour of our visit to make sure the prescription was transmitted appropriately.   Please try these tips to maintain a healthy lifestyle:  Eat at least 3 REAL meals and 1-2 snacks per day.  Aim for no more than 5 hours between eating.  If you eat breakfast, please do so within one hour of getting up.   Each meal should contain half fruits/vegetables, one quarter protein, and one quarter carbs (no bigger than a computer mouse)  Cut down on sweet beverages. This includes juice, soda, and sweet tea.   Drink at least 1 glass of water with each meal and aim for at least 8 glasses per day  Exercise at least 150 minutes every week.

## 2022-01-26 NOTE — Assessment & Plan Note (Signed)
Will give another round of antibiotics today.  She is still having some mild persistent symptoms.  She will need to follow-up with ENT if this continues to be an issue.

## 2022-01-26 NOTE — Assessment & Plan Note (Signed)
Symptoms are overall stable though still bothersome.  Will be increasing her Seroquel as above to 100 mg daily.  Continue Lexapro 10 mg daily and BuSpar 5 mg twice daily.  We did place referral to psychiatry about a month ago however she has not yet heard back.  Will check on status of this referral.

## 2022-01-26 NOTE — Assessment & Plan Note (Signed)
Slightly above goal today though typically well-controlled.  Will continue regimen HCTZ 12.5 mg daily, amlodipine 10 mg daily, and lisinopril 40 mg daily.

## 2022-01-26 NOTE — Assessment & Plan Note (Signed)
Last A1c 7.3.  She will really recheck today.  She will come back in 2 to 3 months and we can recheck at that time.  Continue metformin 500 mg daily and Jardiance 10 mg daily.

## 2022-01-27 ENCOUNTER — Other Ambulatory Visit: Payer: Self-pay | Admitting: Family Medicine

## 2022-01-27 DIAGNOSIS — F5104 Psychophysiologic insomnia: Secondary | ICD-10-CM

## 2022-01-29 ENCOUNTER — Other Ambulatory Visit: Payer: Self-pay | Admitting: Family Medicine

## 2022-01-29 DIAGNOSIS — N1831 Chronic kidney disease, stage 3a: Secondary | ICD-10-CM

## 2022-01-29 MED ORDER — EMPAGLIFLOZIN 10 MG PO TABS: ORAL_TABLET | ORAL | 0 refills | Status: DC

## 2022-01-29 NOTE — Telephone Encounter (Signed)
  LAST APPOINTMENT DATE:  Please schedule appointment if longer than 1 year  01/26/22  NEXT APPOINTMENT DATE:  MEDICATION:  empagliflozin (JARDIANCE) 10 MG TABS tablet   Is the patient out of medication? Yes  PHARMACY:  [facility] [id number] - [place], [address] Phone: [phone]  Fax: [phone]      Let patient know to contact pharmacy at the end of the day to make sure medication is ready.  Please notify patient to allow 48-72 hours to process

## 2022-01-29 NOTE — Telephone Encounter (Signed)
Last refill by  Maximiano Coss, NP  On 08/19/2021

## 2022-02-04 ENCOUNTER — Emergency Department (HOSPITAL_COMMUNITY): Payer: 59

## 2022-02-04 ENCOUNTER — Other Ambulatory Visit: Payer: Self-pay | Admitting: *Deleted

## 2022-02-04 ENCOUNTER — Other Ambulatory Visit: Payer: Self-pay | Admitting: Family Medicine

## 2022-02-04 ENCOUNTER — Observation Stay (HOSPITAL_COMMUNITY)
Admission: EM | Admit: 2022-02-04 | Discharge: 2022-02-07 | Disposition: A | Discharge status: Home / Self Care | Payer: 59 | Attending: Gastroenterology | Admitting: Gastroenterology

## 2022-02-04 ENCOUNTER — Other Ambulatory Visit: Payer: Self-pay

## 2022-02-04 DIAGNOSIS — R112 Nausea with vomiting, unspecified: Secondary | ICD-10-CM | POA: Diagnosis present

## 2022-02-04 DIAGNOSIS — F5104 Psychophysiologic insomnia: Secondary | ICD-10-CM

## 2022-02-04 DIAGNOSIS — Z7984 Long term (current) use of oral hypoglycemic drugs: Secondary | ICD-10-CM | POA: Diagnosis not present

## 2022-02-04 DIAGNOSIS — F321 Major depressive disorder, single episode, moderate: Secondary | ICD-10-CM | POA: Diagnosis present

## 2022-02-04 DIAGNOSIS — Z79899 Other long term (current) drug therapy: Secondary | ICD-10-CM | POA: Insufficient documentation

## 2022-02-04 DIAGNOSIS — I152 Hypertension secondary to endocrine disorders: Secondary | ICD-10-CM | POA: Diagnosis present

## 2022-02-04 DIAGNOSIS — K922 Gastrointestinal hemorrhage, unspecified: Secondary | ICD-10-CM | POA: Diagnosis present

## 2022-02-04 DIAGNOSIS — K921 Melena: Principal | ICD-10-CM | POA: Insufficient documentation

## 2022-02-04 DIAGNOSIS — Z7982 Long term (current) use of aspirin: Secondary | ICD-10-CM | POA: Diagnosis not present

## 2022-02-04 DIAGNOSIS — E876 Hypokalemia: Secondary | ICD-10-CM | POA: Diagnosis not present

## 2022-02-04 DIAGNOSIS — K2210 Ulcer of esophagus without bleeding: Secondary | ICD-10-CM | POA: Insufficient documentation

## 2022-02-04 DIAGNOSIS — E1129 Type 2 diabetes mellitus with other diabetic kidney complication: Secondary | ICD-10-CM | POA: Diagnosis present

## 2022-02-04 DIAGNOSIS — I10 Essential (primary) hypertension: Secondary | ICD-10-CM | POA: Diagnosis not present

## 2022-02-04 DIAGNOSIS — K2970 Gastritis, unspecified, without bleeding: Secondary | ICD-10-CM | POA: Insufficient documentation

## 2022-02-04 DIAGNOSIS — F0390 Unspecified dementia without behavioral disturbance: Secondary | ICD-10-CM | POA: Diagnosis present

## 2022-02-04 DIAGNOSIS — F411 Generalized anxiety disorder: Secondary | ICD-10-CM | POA: Diagnosis present

## 2022-02-04 DIAGNOSIS — E119 Type 2 diabetes mellitus without complications: Secondary | ICD-10-CM | POA: Diagnosis not present

## 2022-02-04 DIAGNOSIS — Z20822 Contact with and (suspected) exposure to covid-19: Secondary | ICD-10-CM | POA: Insufficient documentation

## 2022-02-04 DIAGNOSIS — K449 Diaphragmatic hernia without obstruction or gangrene: Secondary | ICD-10-CM | POA: Diagnosis not present

## 2022-02-04 DIAGNOSIS — N281 Cyst of kidney, acquired: Secondary | ICD-10-CM

## 2022-02-04 LAB — CBC WITH DIFFERENTIAL/PLATELET
Abs Immature Granulocytes: 0.09 10*3/uL — ABNORMAL HIGH (ref 0.00–0.07)
Basophils Absolute: 0.1 10*3/uL (ref 0.0–0.1)
Basophils Relative: 1 %
Eosinophils Absolute: 0 10*3/uL (ref 0.0–0.5)
Eosinophils Relative: 0 %
HCT: 40.3 % (ref 36.0–46.0)
Hemoglobin: 13.2 g/dL (ref 12.0–15.0)
Immature Granulocytes: 1 %
Lymphocytes Relative: 11 %
Lymphs Abs: 2.1 10*3/uL (ref 0.7–4.0)
MCH: 27.8 pg (ref 26.0–34.0)
MCHC: 32.8 g/dL (ref 30.0–36.0)
MCV: 84.8 fL (ref 80.0–100.0)
Monocytes Absolute: 1 10*3/uL (ref 0.1–1.0)
Monocytes Relative: 5 %
Neutro Abs: 15.7 10*3/uL — ABNORMAL HIGH (ref 1.7–7.7)
Neutrophils Relative %: 82 %
Platelets: 295 10*3/uL (ref 150–400)
RBC: 4.75 MIL/uL (ref 3.87–5.11)
RDW: 14.9 % (ref 11.5–15.5)
WBC: 19 10*3/uL — ABNORMAL HIGH (ref 4.0–10.5)
nRBC: 0 % (ref 0.0–0.2)

## 2022-02-04 LAB — URINALYSIS, ROUTINE W REFLEX MICROSCOPIC
Bacteria, UA: NONE SEEN
Bilirubin Urine: NEGATIVE
Glucose, UA: >=500 mg/dL — AB
Ketones, ur: NEGATIVE mg/dL
Leukocytes,Ua: NEGATIVE
Nitrite: NEGATIVE
Protein, ur: NEGATIVE mg/dL
Specific Gravity, Urine: 1.017 (ref 1.005–1.030)
pH: 7 (ref 5.0–8.0)

## 2022-02-04 LAB — COMPREHENSIVE METABOLIC PANEL
ALT: 21 U/L (ref 0–44)
AST: 30 U/L (ref 15–41)
Albumin: 4.4 g/dL (ref 3.5–5.0)
Alkaline Phosphatase: 84 U/L (ref 38–126)
Anion gap: 14 (ref 5–15)
BUN: 13 mg/dL (ref 8–23)
CO2: 28 mmol/L (ref 22–32)
Calcium: 9.7 mg/dL (ref 8.9–10.3)
Chloride: 99 mmol/L (ref 98–111)
Creatinine, Ser: 0.96 mg/dL (ref 0.44–1.00)
GFR, Estimated: 58 mL/min — ABNORMAL LOW (ref >60)
Glucose, Bld: 263 mg/dL — ABNORMAL HIGH (ref 70–99)
Potassium: 2.4 mmol/L — CL (ref 3.5–5.1)
Sodium: 141 mmol/L (ref 135–145)
Total Bilirubin: 0.5 mg/dL (ref 0.3–1.2)
Total Protein: 7.7 g/dL (ref 6.5–8.1)

## 2022-02-04 LAB — RESP PANEL BY RT-PCR (RSV, FLU A&B, COVID)  RVPGX2
Influenza A by PCR: NEGATIVE
Influenza B by PCR: NEGATIVE
Resp Syncytial Virus by PCR: NEGATIVE
SARS Coronavirus 2 by RT PCR: NEGATIVE

## 2022-02-04 LAB — LIPASE, BLOOD: Lipase: 36 U/L (ref 11–51)

## 2022-02-04 MED ORDER — IOHEXOL 350 MG/ML SOLN
70.0000 mL | Freq: Once | INTRAVENOUS | Status: AC | PRN
  Administered 2022-02-04: 70 mL via INTRAVENOUS

## 2022-02-04 MED ORDER — POTASSIUM CHLORIDE CRYS ER 20 MEQ PO TBCR: 40.0000 meq | EXTENDED_RELEASE_TABLET | Freq: Once | ORAL | Status: DC

## 2022-02-04 MED ORDER — SODIUM CHLORIDE 0.9 % IV BOLUS
500.0000 mL | Freq: Once | INTRAVENOUS | Status: AC
  Administered 2022-02-04: 500 mL via INTRAVENOUS

## 2022-02-04 MED ORDER — LISINOPRIL 40 MG PO TABS: ORAL_TABLET | ORAL | 1 refills | Status: DC

## 2022-02-04 MED ORDER — ACETAMINOPHEN 325 MG PO TABS
650.0000 mg | ORAL_TABLET | Freq: Four times a day (QID) | ORAL | Status: DC | PRN
  Administered 2022-02-04: 650 mg via ORAL
  Filled 2022-02-04: qty 2

## 2022-02-04 MED ORDER — POTASSIUM CHLORIDE 10 MEQ/100ML IV SOLN
10.0000 meq | INTRAVENOUS | Status: AC
  Administered 2022-02-04 (×3): 10 meq via INTRAVENOUS
  Filled 2022-02-04 (×3): qty 100

## 2022-02-04 MED ORDER — METOCLOPRAMIDE HCL 5 MG/ML IJ SOLN
5.0000 mg | Freq: Once | INTRAMUSCULAR | Status: AC
  Administered 2022-02-04: 5 mg via INTRAVENOUS
  Filled 2022-02-04: qty 2

## 2022-02-04 MED ORDER — POTASSIUM CHLORIDE CRYS ER 20 MEQ PO TBCR
40.0000 meq | EXTENDED_RELEASE_TABLET | Freq: Once | ORAL | Status: AC
  Administered 2022-02-04: 40 meq via ORAL
  Filled 2022-02-04: qty 2

## 2022-02-04 MED ORDER — MAGNESIUM SULFATE IN D5W 1-5 GM/100ML-% IV SOLN
1.0000 g | Freq: Once | INTRAVENOUS | Status: AC
  Administered 2022-02-04: 1 g via INTRAVENOUS
  Filled 2022-02-04: qty 100

## 2022-02-04 MED ORDER — AMLODIPINE BESYLATE 10 MG PO TABS: 10.0000 mg | ORAL_TABLET | Freq: Every day | ORAL | 1 refills | Status: DC

## 2022-02-04 MED ORDER — ONDANSETRON HCL 4 MG/2ML IJ SOLN
4.0000 mg | Freq: Once | INTRAMUSCULAR | Status: AC
  Administered 2022-02-04: 4 mg via INTRAVENOUS
  Filled 2022-02-04: qty 2

## 2022-02-04 NOTE — ED Triage Notes (Signed)
Pt arrived via [facility] for hematochezia and N/V x1 day from home. Pt reports dark, tarry stool, EMS noted same on scene. Multiple episodes of vomiting in route, non-bloody. Abdomen soft, non-tender. Afebrile. GCS 15, A&Ox4.   BP 173/73 (106) HR 66 NS RR 18 SPO2 92% CBG 282

## 2022-02-04 NOTE — ED Provider Notes (Signed)
[facility] Provider Note   CSN: [id number] Arrival date & time: 02/04/22  1853     History  Chief Complaint  Patient presents with   Hematochezia    Casey [name] is a 87 y.o. female.  She has past medical history of hypertension and diabetes.  She presents the ER for nausea vomiting and diarrhea that started approximately 3 hours prior to arrival.  She states that her stool is very dark.  She denies any blood in her vomit.  She is not on blood thinners.  Denies history of GI bleeding.  HPI     Home Medications Prior to Admission medications   Medication Sig Start Date End Date Taking? Authorizing Provider  amLODipine (NORVASC) 10 MG tablet Take 1 tablet (10 mg total) by mouth daily. 02/04/22   Vivi Barrack, MD  aspirin 81 MG chewable tablet Chew 1 tablet (81 mg total) by mouth 2 (two) times daily. 07/25/20   Dorothyann Peng, PA-C  atorvastatin (LIPITOR) 10 MG tablet Take 1 tablet (10 mg total) by mouth daily. 08/19/21   Maximiano Coss, NP  azelastine (ASTELIN) 0.1 % nasal spray INSTILL 1 SPRAY INTO BOTH NOSTRILS TWICE DAILY 08/19/21   Maximiano Coss, NP  azelastine (OPTIVAR) 0.05 % ophthalmic solution Place 1 drop into both eyes 2 (two) times daily.    [provider]  azithromycin (ZITHROMAX) 250 MG tablet Take 2 tabs day 1, then 1 tab daily 12/26/21   Vivi Barrack, MD  azithromycin (ZITHROMAX) 250 MG tablet Take 2 tabs day 1, then 1 tab daily 01/26/22   Vivi Barrack, MD  buPROPion (WELLBUTRIN XL) 150 MG 24 hr tablet Take 1 tablet (150 mg total) by mouth daily. 08/19/21   Maximiano Coss, NP  busPIRone (BUSPAR) 5 MG tablet     [provider]  Carboxymethylcellul-Glycerin (REFRESH OPTIVE) 1-0.9 % GEL Place 1 drop into both eyes daily.    [provider]  celecoxib (CELEBREX) 200 MG capsule     [provider]  cyanocobalamin (VITAMIN B12) 500 MCG tablet     [provider]  donepezil (ARICEPT)  10 MG tablet TAKE 1 TABLET(5 MG) BY MOUTH AT BEDTIME 08/19/21   Maximiano Coss, NP  empagliflozin (JARDIANCE) 10 MG TABS tablet TAKE 1 TABLET(10 MG) BY MOUTH DAILY BEFORE BREAKFAST 01/29/22   Vivi Barrack, MD  escitalopram (LEXAPRO) 10 MG tablet Take 1 tablet (10 mg total) by mouth daily. 08/19/21   Maximiano Coss, NP  furosemide (LASIX) 40 MG tablet Take 1 tablet (40 mg total) by mouth daily. 08/19/21   Maximiano Coss, NP  hydrochlorothiazide (HYDRODIURIL) 12.5 MG tablet     [provider]  HYDROcodone-acetaminophen (NORCO/VICODIN) 5-325 MG tablet Take 1 tablet by mouth every 8 (eight) hours as needed for moderate pain. For chronic pain 01/22/22   Vivi Barrack, MD  hydrOXYzine (ATARAX) 10 MG tablet Take 1 tablet (10 mg total) by mouth 3 (three) times daily as needed. 12/26/21   Vivi Barrack, MD  lisinopril (ZESTRIL) 40 MG tablet TAKE 1 TABLET(40 MG) BY MOUTH DAILY 02/04/22   Vivi Barrack, MD  loperamide (IMODIUM A-D) 2 MG tablet Take 1 tablet (2 mg total) by mouth 2 (two) times daily as needed for diarrhea or loose stools. 04/17/21   Maximiano Coss, NP  meclizine (ANTIVERT) 25 MG tablet Take 1 tablet (25 mg total) by mouth 3 (three) times daily as needed for dizziness. 11/26/21   Jerline Pain,  Algis Greenhouse, MD  metFORMIN (GLUCOPHAGE) 500 MG tablet 500 mg by oral route.    [provider]  ondansetron (ZOFRAN) 4 MG tablet     [provider]  pantoprazole (PROTONIX) 40 MG tablet Take 1 tablet (40 mg total) by mouth daily. 09/26/21   Vivi Barrack, MD  QUEtiapine (SEROQUEL XR) 50 MG TB24 24 hr tablet Take 2 tablets (100 mg total) by mouth at bedtime. 01/26/22   Vivi Barrack, MD      Allergies    Patient has no known allergies.    Review of Systems   Review of Systems  Physical Exam Updated Vital Signs BP (!) 118/54   Pulse 80   Temp 97.6 F (36.4 C) (Oral)   Resp 14   Ht 5\' 2"  (1.575 m)   Wt 66.7 kg   SpO2 96%   BMI 26.89 kg/m  Physical Exam Vitals and nursing  note reviewed.  Constitutional:      General: She is not in acute distress.    Appearance: She is well-developed.  HENT:     Head: Normocephalic and atraumatic.     Mouth/Throat:     Mouth: Mucous membranes are moist.  Eyes:     Conjunctiva/sclera: Conjunctivae normal.  Cardiovascular:     Rate and Rhythm: Normal rate and regular rhythm.     Heart sounds: No murmur heard. Pulmonary:     Effort: Pulmonary effort is normal. No respiratory distress.     Breath sounds: Normal breath sounds.  Abdominal:     General: Abdomen is flat.     Palpations: Abdomen is soft.     Tenderness: There is no abdominal tenderness.  Genitourinary:    Rectum: Normal. Guaiac result negative.     Comments: Guaic negative, positive control Musculoskeletal:        General: No swelling or tenderness.     Cervical back: Neck supple.  Skin:    General: Skin is warm and dry.     Capillary Refill: Capillary refill takes less than 2 seconds.  Neurological:     General: No focal deficit present.     Mental Status: She is alert and oriented to person, place, and time.  Psychiatric:        Mood and Affect: Mood normal.     ED Results / Procedures / Treatments   Labs (all labs ordered are listed, but only abnormal results are displayed) Labs Reviewed  CBC WITH DIFFERENTIAL/PLATELET - Abnormal; Notable for the following components:      Result Value   WBC 19.0 (*)    Neutro Abs 15.7 (*)    Abs Immature Granulocytes 0.09 (*)    All other components within normal limits  COMPREHENSIVE METABOLIC PANEL - Abnormal; Notable for the following components:   Potassium 2.4 (*)    Glucose, Bld 263 (*)    GFR, Estimated 58 (*)    All other components within normal limits  URINALYSIS, ROUTINE W REFLEX MICROSCOPIC - Abnormal; Notable for the following components:   Color, Urine STRAW (*)    Glucose, UA >=500 (*)    Hgb urine dipstick SMALL (*)    All other components within normal limits  RESP PANEL BY RT-PCR  (RSV, FLU A&B, COVID)  RVPGX2  LIPASE, BLOOD  BASIC METABOLIC PANEL  MAGNESIUM  RAPID URINE DRUG SCREEN, HOSP PERFORMED    EKG EKG Interpretation  Date/Time:  Wednesday February 04 2022 19:04:47 EST Ventricular Rate:  71 PR Interval:  225  QRS Duration: 111 QT Interval:  441 QTC Calculation: 480 R Axis:   -40 Text Interpretation: Sinus rhythm Prolonged PR interval Probable left atrial enlargement Left axis deviation Consider anterior infarct Nonspecific repol abnormality, lateral leads Artifact in lead(s) I III aVR aVL aVF No significant change since last tracing Confirmed by Blanchie Dessert [phone]) on 02/04/2022 9:20:27 PM  Radiology CT ABDOMEN PELVIS W CONTRAST  Result Date: 02/04/2022 CLINICAL DATA:  Abdominal pain and hematochezia EXAM: CT ABDOMEN AND PELVIS WITH CONTRAST TECHNIQUE: Multidetector CT imaging of the abdomen and pelvis was performed using the standard protocol following bolus administration of intravenous contrast. RADIATION DOSE REDUCTION: This exam was performed according to the departmental dose-optimization program which includes automated exposure control, adjustment of the mA and/or kV according to patient size and/or use of iterative reconstruction technique. CONTRAST:  58mL OMNIPAQUE IOHEXOL 350 MG/ML SOLN COMPARISON:  None Available. FINDINGS: Lower chest: Lung bases are free of acute infiltrate. Minimal scarring is noted bilaterally. Hepatobiliary: Gallbladder is well distended without evidence of cholelithiasis. Liver shows a few scattered hypodensities without significant change on delayed images consistent with small cysts. Biliary ductal system shows no obstructive change. Pancreas: Unremarkable. No pancreatic ductal dilatation or surrounding inflammatory changes. Spleen: Normal in size without focal abnormality. Adrenals/Urinary Tract: Adrenal glands are within normal limits. Right kidney demonstrates multiple cystic lesions. One of these is simple in appearance  measuring up to 4.6 cm. The second measures 3.2 cm in demonstrates significant calcification along the wall. Left kidney also demonstrates cystic change which appears simple in nature. The largest of these measures 5.4 cm. Punctate nonobstructing renal stone is noted on the left in the lower pole. No obstructive changes are seen. The bladder is well distended. Stomach/Bowel: The appendix is well visualized. No obstructive or inflammatory changes of the colon are seen. Small bowel and stomach appear within normal limits. Vascular/Lymphatic: No significant vascular findings are present. No enlarged abdominal or pelvic lymph nodes. Reproductive: Uterus and bilateral adnexa are unremarkable. Other: No free fluid is noted. Small fat containing umbilical hernia is noted. Musculoskeletal: Degenerative changes of lumbar spine are seen. IMPRESSION: Cystic changes are noted within both kidneys. The predominance of these are simple in nature. Some calcifications are noted within 1 of the right renal cysts however. No significant enhancement is noted and the cyst is otherwise simple in appearance. Short-term follow-up with ultrasound of the kidneys in 3-6 months is recommended to assess for stability. No findings to correspond with the given clinical history are noted. Punctate nonobstructing left renal stone. Electronically Signed   By: Inez Catalina M.D.   On: 02/04/2022 21:36    Procedures Procedures    Medications Ordered in ED Medications  acetaminophen (TYLENOL) tablet 650 mg (650 mg Oral Given 02/04/22 2332)  ondansetron (ZOFRAN) injection 4 mg (4 mg Intravenous Given 02/04/22 1928)  sodium chloride 0.9 % bolus 500 mL (0 mLs Intravenous Stopped 02/04/22 2207)  potassium chloride 10 mEq in 100 mL IVPB (0 mEq Intravenous Stopped 02/05/22 0019)  magnesium sulfate IVPB 1 g 100 mL (0 g Intravenous Stopped 02/04/22 2316)  iohexol (OMNIPAQUE) 350 MG/ML injection 70 mL (70 mLs Intravenous Contrast Given 02/04/22 2128)   metoCLOPramide (REGLAN) injection 5 mg (5 mg Intravenous Given 02/04/22 2207)  potassium chloride SA (KLOR-CON M) CR tablet 40 mEq (40 mEq Oral Given 02/04/22 2332)    ED Course/ Medical Decision Making/ A&P  Medical Decision Making This patient presents to the ED for concern of nausea/vomiting/diarrhea, this involves an extensive number of treatment options, and is a complaint that carries with it a high risk of complications and morbidity.  The differential diagnosis includes gastroenteritis, small bowel obstruction, cystitis, ureterolithiasis, GI bleed, other   Co morbidities that complicate the patient evaluation  Diabetes   Additional history obtained:  Additional history obtained from EMR External records from outside source obtained and reviewed including patient gynecology notes reviewed, prior PCP notes reviewed   Lab Tests:  I Ordered, and personally interpreted labs.  The pertinent results include: CBC shows leukocytosis which is possibly an acute phase reaction from her violent vomiting for the past several hours prior to arrival.  She has no fevers chills or tachycardia.  Her potassium is noted to be very low at 2.4, repleted orally and IV and potassium repleted empirically   Imaging Studies ordered:  I ordered imaging studies including ct abdomen and pelvis  I independently visualized and interpreted imaging which showed renal cysts I agree with the radiologist interpretation   Cardiac Monitoring: / EKG:  The patient was maintained on a cardiac monitor.  I personally viewed and interpreted the cardiac monitored which showed an underlying rhythm of: Sinus rhythm      Problem List / ED Course / Critical interventions / Medication management  Nausea vomiting diarrhea-patient initially had abdominal pain, this time and several hours of abdominal pain with nausea vomiting and diarrhea, she is feeling better after Zofran and Reglan and  fluids.  Potassium is being repleted.  I ordered medication including Zofran and Reglan for nausea Reevaluation of the patient after these medicines showed that the patient improved I have reviewed the patients home medicines and have made adjustments as needed   Social Determinants of Health:  Patient lives at home alone   Amount and/or Complexity of Data Reviewed Labs: ordered.  Risk Prescription drug management.   Signed out to oncoming team  Admit vs dispo pending repeat potassium and pt feeling better        Final Clinical Impression(s) / ED Diagnoses Final diagnoses:  None    Rx / DC Orders ED Discharge Orders     None         Ma Rings, PA-C 02/05/22 0035    Gwyneth Sprout, MD 02/05/22 1825

## 2022-02-05 ENCOUNTER — Encounter (HOSPITAL_COMMUNITY): Payer: Self-pay | Admitting: Family Medicine

## 2022-02-05 DIAGNOSIS — I152 Hypertension secondary to endocrine disorders: Secondary | ICD-10-CM

## 2022-02-05 DIAGNOSIS — E876 Hypokalemia: Secondary | ICD-10-CM | POA: Diagnosis not present

## 2022-02-05 DIAGNOSIS — F0390 Unspecified dementia without behavioral disturbance: Secondary | ICD-10-CM

## 2022-02-05 DIAGNOSIS — N281 Cyst of kidney, acquired: Secondary | ICD-10-CM

## 2022-02-05 DIAGNOSIS — K921 Melena: Secondary | ICD-10-CM | POA: Diagnosis not present

## 2022-02-05 DIAGNOSIS — K922 Gastrointestinal hemorrhage, unspecified: Secondary | ICD-10-CM | POA: Diagnosis present

## 2022-02-05 DIAGNOSIS — E1159 Type 2 diabetes mellitus with other circulatory complications: Secondary | ICD-10-CM

## 2022-02-05 DIAGNOSIS — F411 Generalized anxiety disorder: Secondary | ICD-10-CM

## 2022-02-05 DIAGNOSIS — R809 Proteinuria, unspecified: Secondary | ICD-10-CM

## 2022-02-05 DIAGNOSIS — E1129 Type 2 diabetes mellitus with other diabetic kidney complication: Secondary | ICD-10-CM

## 2022-02-05 LAB — CBC
HCT: 35.3 % — ABNORMAL LOW (ref 36.0–46.0)
HCT: 36.2 % (ref 36.0–46.0)
HCT: 35.5 % — ABNORMAL LOW (ref 36.0–46.0)
HCT: 35.9 % — ABNORMAL LOW (ref 36.0–46.0)
Hemoglobin: 11.7 g/dL — ABNORMAL LOW (ref 12.0–15.0)
Hemoglobin: 11.6 g/dL — ABNORMAL LOW (ref 12.0–15.0)
Hemoglobin: 11.5 g/dL — ABNORMAL LOW (ref 12.0–15.0)
Hemoglobin: 11.8 g/dL — ABNORMAL LOW (ref 12.0–15.0)
MCH: 28.1 pg (ref 26.0–34.0)
MCH: 27.8 pg (ref 26.0–34.0)
MCH: 27.6 pg (ref 26.0–34.0)
MCH: 27.9 pg (ref 26.0–34.0)
MCHC: 33.1 g/dL (ref 30.0–36.0)
MCHC: 32 g/dL (ref 30.0–36.0)
MCHC: 32.4 g/dL (ref 30.0–36.0)
MCHC: 32.9 g/dL (ref 30.0–36.0)
MCV: 84.7 fL (ref 80.0–100.0)
MCV: 86.6 fL (ref 80.0–100.0)
MCV: 85.3 fL (ref 80.0–100.0)
MCV: 84.9 fL (ref 80.0–100.0)
Platelets: 276 10*3/uL (ref 150–400)
Platelets: 268 10*3/uL (ref 150–400)
Platelets: 281 10*3/uL (ref 150–400)
Platelets: 259 10*3/uL (ref 150–400)
RBC: 4.17 MIL/uL (ref 3.87–5.11)
RBC: 4.18 MIL/uL (ref 3.87–5.11)
RBC: 4.16 MIL/uL (ref 3.87–5.11)
RBC: 4.23 MIL/uL (ref 3.87–5.11)
RDW: 15 % (ref 11.5–15.5)
RDW: 15.1 % (ref 11.5–15.5)
RDW: 15.3 % (ref 11.5–15.5)
RDW: 15.3 % (ref 11.5–15.5)
WBC: 12 10*3/uL — ABNORMAL HIGH (ref 4.0–10.5)
WBC: 10 10*3/uL (ref 4.0–10.5)
WBC: 9.4 10*3/uL (ref 4.0–10.5)
WBC: 9.2 10*3/uL (ref 4.0–10.5)
nRBC: 0 % (ref 0.0–0.2)
nRBC: 0 % (ref 0.0–0.2)
nRBC: 0 % (ref 0.0–0.2)
nRBC: 0 % (ref 0.0–0.2)

## 2022-02-05 LAB — BASIC METABOLIC PANEL
Anion gap: 9 (ref 5–15)
Anion gap: 11 (ref 5–15)
BUN: 10 mg/dL (ref 8–23)
BUN: 9 mg/dL (ref 8–23)
CO2: 28 mmol/L (ref 22–32)
CO2: 28 mmol/L (ref 22–32)
Calcium: 8.9 mg/dL (ref 8.9–10.3)
Calcium: 9 mg/dL (ref 8.9–10.3)
Chloride: 103 mmol/L (ref 98–111)
Chloride: 104 mmol/L (ref 98–111)
Creatinine, Ser: 0.85 mg/dL (ref 0.44–1.00)
Creatinine, Ser: 0.85 mg/dL (ref 0.44–1.00)
GFR, Estimated: >60 mL/min (ref >60)
GFR, Estimated: >60 mL/min (ref >60)
Glucose, Bld: 171 mg/dL — ABNORMAL HIGH (ref 70–99)
Glucose, Bld: 159 mg/dL — ABNORMAL HIGH (ref 70–99)
Potassium: 3.8 mmol/L (ref 3.5–5.1)
Potassium: 3.4 mmol/L — ABNORMAL LOW (ref 3.5–5.1)
Sodium: 140 mmol/L (ref 135–145)
Sodium: 143 mmol/L (ref 135–145)

## 2022-02-05 LAB — MAGNESIUM: Magnesium: 2.1 mg/dL (ref 1.7–2.4)

## 2022-02-05 LAB — CBG MONITORING, ED
Glucose-Capillary: 130 mg/dL — ABNORMAL HIGH (ref 70–99)
Glucose-Capillary: 146 mg/dL — ABNORMAL HIGH (ref 70–99)
Glucose-Capillary: 97 mg/dL (ref 70–99)

## 2022-02-05 LAB — POC OCCULT BLOOD, ED: Fecal Occult Bld: POSITIVE — AB

## 2022-02-05 LAB — GLUCOSE, CAPILLARY
Glucose-Capillary: 175 mg/dL — ABNORMAL HIGH (ref 70–99)
Glucose-Capillary: 124 mg/dL — ABNORMAL HIGH (ref 70–99)

## 2022-02-05 MED ORDER — PANTOPRAZOLE SODIUM 40 MG IV SOLR
40.0000 mg | Freq: Two times a day (BID) | INTRAVENOUS | Status: DC
  Administered 2022-02-05 – 2022-02-07 (×5): 40 mg via INTRAVENOUS
  Filled 2022-02-05 (×5): qty 10

## 2022-02-05 MED ORDER — HYDROCODONE-ACETAMINOPHEN 5-325 MG PO TABS
1.0000 | ORAL_TABLET | Freq: Three times a day (TID) | ORAL | Status: DC | PRN
  Administered 2022-02-05 – 2022-02-06 (×2): 1 via ORAL
  Filled 2022-02-05 (×2): qty 1

## 2022-02-05 MED ORDER — HYDROXYZINE HCL 10 MG PO TABS: 10.0000 mg | ORAL_TABLET | Freq: Three times a day (TID) | ORAL | Status: DC | PRN

## 2022-02-05 MED ORDER — ATORVASTATIN CALCIUM 10 MG PO TABS
10.0000 mg | ORAL_TABLET | Freq: Every day | ORAL | Status: DC
  Administered 2022-02-05 – 2022-02-07 (×3): 10 mg via ORAL
  Filled 2022-02-05 (×3): qty 1

## 2022-02-05 MED ORDER — PROCHLORPERAZINE EDISYLATE 10 MG/2ML IJ SOLN: 10.0000 mg | Freq: Four times a day (QID) | INTRAMUSCULAR | Status: DC | PRN

## 2022-02-05 MED ORDER — SODIUM CHLORIDE 0.9% FLUSH
3.0000 mL | Freq: Two times a day (BID) | INTRAVENOUS | Status: DC
  Administered 2022-02-05 – 2022-02-07 (×5): 3 mL via INTRAVENOUS

## 2022-02-05 MED ORDER — QUETIAPINE FUMARATE ER 50 MG PO TB24
100.0000 mg | ORAL_TABLET | Freq: Every day | ORAL | Status: DC
  Administered 2022-02-05 – 2022-02-06 (×2): 100 mg via ORAL
  Filled 2022-02-05 (×2): qty 2

## 2022-02-05 MED ORDER — ACETAMINOPHEN 325 MG PO TABS: 650.0000 mg | ORAL_TABLET | Freq: Four times a day (QID) | ORAL | Status: DC | PRN

## 2022-02-05 MED ORDER — POTASSIUM CHLORIDE CRYS ER 20 MEQ PO TBCR
20.0000 meq | EXTENDED_RELEASE_TABLET | Freq: Once | ORAL | Status: AC
  Administered 2022-02-05: 20 meq via ORAL
  Filled 2022-02-05: qty 1

## 2022-02-05 MED ORDER — HYDRALAZINE HCL 20 MG/ML IJ SOLN: 10.0000 mg | Freq: Three times a day (TID) | INTRAMUSCULAR | Status: DC | PRN

## 2022-02-05 MED ORDER — SODIUM CHLORIDE 0.9 % IV SOLN: INTRAVENOUS | Status: DC

## 2022-02-05 MED ORDER — INSULIN ASPART 100 UNIT/ML IJ SOLN
0.0000 [IU] | INTRAMUSCULAR | Status: DC
  Administered 2022-02-05 – 2022-02-06 (×2): 1 [IU] via SUBCUTANEOUS

## 2022-02-05 NOTE — ED Provider Notes (Signed)
Patient here with vomiting and dark stools.  Onset earlier today.    K is 2.4.  supplemented by afternoon team. Will repeat.  Has prolonged QT.    K is uptrending.  Now 3.8.  Will repeat CBC to see if HGB has changed.   HGB 13.2->11.7.    2:39 AM POC occult blood did not cross over.  I repeated it and it was positive.  DRE was notable for soft brown stool without any gross blood.  Occult positive.  Drop in HGB.  Significant hypokalemia (improving) in setting of prolonged QT.    Discussed with Dr. Wyvonnia Dusky.  Will consult hospitalist for observation admission.   Montine Circle, PA-C 02/05/22 [id number]    Ezequiel Essex, MD 02/05/22 [id number]

## 2022-02-05 NOTE — ED Notes (Signed)
Receiving RN notified that patient is being transported now to the unit.

## 2022-02-05 NOTE — Consult Note (Signed)
Referring Provider: TH Primary Care Physician:  Ardith Dark, MD Primary Gastroenterologist: [facility]/Dr. Madilyn Fireman  Reason for Consultation: GI bleed  HPI: Casey [name] is a 87 y.o. female with past medical history of hypertension, diabetes, dementia presented to the hospital with nausea vomiting and diarrhea.  Subsequently started noticing dark-colored stool.  GI is consulted for further evaluation.  Patient recently started taking ibuprofen for headache.  Initial blood work showed significant leukocytosis WBC of 19, normal hemoglobin.  Likely hemoconcentration.  Normal LFTs.  FOBT positive.  Normal lipase.  CT abdomen pelvis with IV contrast showed no acute GI issues.  Patient seen and examined at bedside.  She started noticing nausea and vomiting recently followed by black-colored stool.  Was also complaining of epigastric as well as left upper quadrant abdominal pain.  Denies any chest pain.  Complaining of headache.  Last colonoscopy in 2009 showed post polypectomy scar in the sigmoid colon.  Biopsy showed hyperplastic changes with lymphoid aggregates.   Past Medical History:  Diagnosis Date   ALLERGIC RHINITIS    Allergy    ANXIETY    ARM PAIN, RIGHT    BENIGN POSITIONAL VERTIGO    Carcinoma in situ of colon    CONSTIPATION    CYSTOCELE WITH INCOMPLETE UTERINE PROLAPSE    Diabetes mellitus without complication (HCC)    ESOPHAGEAL STRICTURE    FATIGUE    FLANK PAIN, LEFT    Headache(784.0)    HIP PAIN, LEFT    HYPERCHOLESTEROLEMIA    HYPERGLYCEMIA    HYPERTENSION, BENIGN ESSENTIAL 03/03/2007   LEG EDEMA, BILATERAL    Microscopic hematuria    OBESITY    OSTEOARTHRITIS, KNEE    OSTEOPOROSIS    Peripheral neuropathy, idiopathic    Problems with swallowing and mastication    SINUSITIS, RECURRENT    SPRAIN&STRAIN OTH SPEC SITES SHOULDER&UPPER ARM     Past Surgical History:  Procedure Laterality Date   COLONOSCOPY     KNEE ARTHROPLASTY Right 07/24/2020   Procedure:  COMPUTER ASSISTED TOTAL KNEE ARTHROPLASTY;  Surgeon: Samson Frederic, MD;  Location: [facility];  Service: Orthopedics;  Laterality: Right;   LEFT HEART CATHETERIZATION WITH CORONARY ANGIOGRAM N/A 12/02/2010   Procedure: LEFT HEART CATHETERIZATION WITH CORONARY ANGIOGRAM;  Surgeon: Peter M Swaziland, MD;  Location: [facility] CATH LAB;  Service: Cardiovascular;:: NORMAL CORONARY ARTERIES - NORMAL LV FUNCTION & EDP.     TRANSTHORACIC ECHOCARDIOGRAM  11/2010   Normal EF 60-65%. No RWMA.  Mild LAD dilation c/w Gr 1 DD.  -- PRETTY NORMAL ECHO    Prior to Admission medications   Medication Sig Start Date End Date Taking? Authorizing Provider  amLODipine (NORVASC) 10 MG tablet Take 1 tablet (10 mg total) by mouth daily. 02/04/22  Yes Ardith Dark, MD  aspirin 81 MG chewable tablet Chew 1 tablet (81 mg total) by mouth 2 (two) times daily. 07/25/20  Yes Darrick Grinder, PA-C  atorvastatin (LIPITOR) 10 MG tablet Take 1 tablet (10 mg total) by mouth daily. 08/19/21  Yes [name], NP  azelastine (ASTELIN) 0.1 % nasal spray INSTILL 1 SPRAY INTO BOTH NOSTRILS TWICE DAILY 08/19/21  Yes [name], NP  azelastine (OPTIVAR) 0.05 % ophthalmic solution Place 1 drop into both eyes 2 (two) times daily.   Yes [provider]  buPROPion (WELLBUTRIN XL) 150 MG 24 hr tablet Take 1 tablet (150 mg total) by mouth daily. 08/19/21  Yes [name], NP  Carboxymethylcellul-Glycerin (REFRESH OPTIVE) 1-0.9 % GEL Place 1 drop  into both eyes daily.   Yes [provider]  celecoxib (CELEBREX) 200 MG capsule    Yes [provider]  cyanocobalamin (VITAMIN B12) 500 MCG tablet    Yes [provider]  donepezil (ARICEPT) 10 MG tablet TAKE 1 TABLET(5 MG) BY MOUTH AT BEDTIME 08/19/21  Yes Maximiano Coss, NP  empagliflozin (JARDIANCE) 10 MG TABS tablet TAKE 1 TABLET(10 MG) BY MOUTH DAILY BEFORE BREAKFAST 01/29/22  Yes Vivi Barrack, MD  escitalopram (LEXAPRO) 10 MG tablet Take 1 tablet (10 mg total) by  mouth daily. 08/19/21  Yes Maximiano Coss, NP  furosemide (LASIX) 40 MG tablet Take 1 tablet (40 mg total) by mouth daily. 08/19/21  Yes Maximiano Coss, NP  HYDROcodone-acetaminophen (NORCO/VICODIN) 5-325 MG tablet Take 1 tablet by mouth every 8 (eight) hours as needed for moderate pain. For chronic pain 01/22/22  Yes Vivi Barrack, MD  hydrOXYzine (ATARAX) 10 MG tablet Take 1 tablet (10 mg total) by mouth 3 (three) times daily as needed. Patient taking differently: Take 20 mg by mouth 3 (three) times daily as needed for anxiety. 12/26/21  Yes Vivi Barrack, MD  lisinopril (ZESTRIL) 40 MG tablet TAKE 1 TABLET(40 MG) BY MOUTH DAILY 02/04/22  Yes Vivi Barrack, MD  meclizine (ANTIVERT) 25 MG tablet Take 1 tablet (25 mg total) by mouth 3 (three) times daily as needed for dizziness. 11/26/21  Yes Vivi Barrack, MD  omeprazole (PRILOSEC) 40 MG capsule Take 40 mg by mouth daily. 11/25/21  Yes [provider]  QUEtiapine (SEROQUEL XR) 50 MG TB24 24 hr tablet Take 2 tablets (100 mg total) by mouth at bedtime. 01/26/22  Yes Vivi Barrack, MD  azithromycin [facility]) 250 MG tablet Take 2 tabs day 1, then 1 tab daily Patient not taking: Reported on 02/05/2022 12/26/21   Vivi Barrack, MD  azithromycin [facility]) 250 MG tablet Take 2 tabs day 1, then 1 tab daily Patient not taking: Reported on 02/05/2022 01/26/22   Vivi Barrack, MD  loperamide (IMODIUM A-D) 2 MG tablet Take 1 tablet (2 mg total) by mouth 2 (two) times daily as needed for diarrhea or loose stools. Patient not taking: Reported on 02/05/2022 04/17/21   Maximiano Coss, NP  pantoprazole (PROTONIX) 40 MG tablet Take 1 tablet (40 mg total) by mouth daily. Patient not taking: Reported on 02/05/2022 09/26/21   Vivi Barrack, MD    Scheduled Meds:  atorvastatin  10 mg Oral Daily   insulin aspart  0-6 Units Subcutaneous Q4H   pantoprazole (PROTONIX) IV  40 mg Intravenous Q12H   QUEtiapine  100 mg Oral QHS   sodium chloride flush  3 mL  Intravenous Q12H   Continuous Infusions: PRN Meds:.acetaminophen, hydrALAZINE, HYDROcodone-acetaminophen, hydrOXYzine, prochlorperazine  Allergies as of 02/04/2022   (No Known Allergies)    Family History  Problem Relation Age of Onset   Coronary artery disease Mother 71   Cancer Father 39   Heart disease Father    Colon cancer Other    Diabetes Other    Coronary artery disease Sister    Diabetes type II Sister    Cancer Sister    Coronary artery disease Brother    Kidney disease Brother    Coronary artery disease Brother    Breast cancer Neg Hx     Social History   Socioeconomic History   Marital status: Single    Spouse name: Not on file   Number of children: 1   Years of  education: 12   Highest education level: High school graduate  Occupational History   Not on file  Tobacco Use   Smoking status: Never   Smokeless tobacco: Never  Vaping Use   Vaping Use: Never used  Substance and Sexual Activity   Alcohol use: No   Drug use: No   Sexual activity: Not Currently  Other Topics Concern   Not on file  Social History Narrative   She tries to stay active, but over the last year (20 19-20 20), she has been very limited by her right knee pain.   Is able to go grocery shopping.   Social Determinants of Health   Financial Resource Strain: Low Risk  (12/25/2016)   Overall Financial Resource Strain (CARDIA)    Difficulty of Paying Living Expenses: Not very hard  Food Insecurity: No Food Insecurity (12/25/2016)   Hunger Vital Sign    Worried About Running Out of Food in the Last Year: Never true    Ran Out of Food in the Last Year: Never true  Transportation Needs: No Transportation Needs (12/25/2016)   PRAPARE - Hydrologist (Medical): No    Lack of Transportation (Non-Medical): No  Physical Activity: Inactive (12/25/2016)   Exercise Vital Sign    Days of Exercise per Week: 0 days    Minutes of Exercise per Session: 0 min  Stress: No  Stress Concern Present (12/25/2016)   [place]    Feeling of Stress : Only a little  Social Connections: Moderately Isolated (12/25/2016)   Social Connection and Isolation Panel [NHANES]    Frequency of Communication with Friends and Family: Once a week    Frequency of Social Gatherings with Friends and Family: Once a week    Attends Religious Services: More than 4 times per year    Active Member of [employer] or Organizations: No    Attends Archivist Meetings: Never    Marital Status: Never married  Intimate Partner Violence: Not At Risk (12/25/2016)   Humiliation, Afraid, Rape, and Kick questionnaire    Fear of Current or Ex-Partner: No    Emotionally Abused: No    Physically Abused: No    Sexually Abused: No    Review of Systems: All negative except as stated above in HPI.  Physical Exam: Vital signs: Vitals:   02/05/22 1100 02/05/22 1300  BP: (!) 132/56 105/86  Pulse: 66 70  Resp: 13 14  Temp:  97.9 F (36.6 C)  SpO2: 95% 96%   Last BM Date : 02/04/22 General:   Alert,  Well-developed, well-nourished, pleasant and cooperative in NAD Lungs: No visible respiratory distress Heart:  Regular rate and rhythm; no murmurs, clicks, rubs,  or gallops. Abdomen: Mild left upper quadrant tenderness to palpation, abdomen is otherwise soft, nondistended, bowel sounds present, no peritoneal signs Alert and oriented x 3 Mood and affect normal Rectal:  Deferred  GI:  Lab Results: Recent Labs    02/04/22 1926 02/05/22 0248 02/05/22 0840  WBC 19.0* 12.0* 10.0  HGB 13.2 11.7* 11.6*  HCT 40.3 35.3* 36.2  PLT 295 276 268   BMET Recent Labs    02/04/22 1926 02/05/22 0025 02/05/22 0248  NA 141 140 143  K 2.4* 3.8 3.4*  CL 99 103 104  CO2 28 28 28   GLUCOSE 263* 171* 159*  BUN 13 10 9   CREATININE 0.96 0.85 0.85  CALCIUM 9.7 8.9 9.0   LFT  Recent Labs    02/04/22 1926  PROT 7.7  ALBUMIN 4.4  AST 30   ALT 21  ALKPHOS 84  BILITOT 0.5   PT/INR No results for input(s): "LABPROT", "INR" in the last 72 hours.   Studies/Results: CT ABDOMEN PELVIS W CONTRAST  Result Date: 02/04/2022 CLINICAL DATA:  Abdominal pain and hematochezia EXAM: CT ABDOMEN AND PELVIS WITH CONTRAST TECHNIQUE: Multidetector CT imaging of the abdomen and pelvis was performed using the standard protocol following bolus administration of intravenous contrast. RADIATION DOSE REDUCTION: This exam was performed according to the departmental dose-optimization program which includes automated exposure control, adjustment of the mA and/or kV according to patient size and/or use of iterative reconstruction technique. CONTRAST:  42mL OMNIPAQUE IOHEXOL 350 MG/ML SOLN COMPARISON:  None Available. FINDINGS: Lower chest: Lung bases are free of acute infiltrate. Minimal scarring is noted bilaterally. Hepatobiliary: Gallbladder is well distended without evidence of cholelithiasis. Liver shows a few scattered hypodensities without significant change on delayed images consistent with small cysts. Biliary ductal system shows no obstructive change. Pancreas: Unremarkable. No pancreatic ductal dilatation or surrounding inflammatory changes. Spleen: Normal in size without focal abnormality. Adrenals/Urinary Tract: Adrenal glands are within normal limits. Right kidney demonstrates multiple cystic lesions. One of these is simple in appearance measuring up to 4.6 cm. The second measures 3.2 cm in demonstrates significant calcification along the wall. Left kidney also demonstrates cystic change which appears simple in nature. The largest of these measures 5.4 cm. Punctate nonobstructing renal stone is noted on the left in the lower pole. No obstructive changes are seen. The bladder is well distended. Stomach/Bowel: The appendix is well visualized. No obstructive or inflammatory changes of the colon are seen. Small bowel and stomach appear within normal limits.  Vascular/Lymphatic: No significant vascular findings are present. No enlarged abdominal or pelvic lymph nodes. Reproductive: Uterus and bilateral adnexa are unremarkable. Other: No free fluid is noted. Small fat containing umbilical hernia is noted. Musculoskeletal: Degenerative changes of lumbar spine are seen. IMPRESSION: Cystic changes are noted within both kidneys. The predominance of these are simple in nature. Some calcifications are noted within 1 of the right renal cysts however. No significant enhancement is noted and the cyst is otherwise simple in appearance. Short-term follow-up with ultrasound of the kidneys in 3-6 months is recommended to assess for stability. No findings to correspond with the given clinical history are noted. Punctate nonobstructing left renal stone. Electronically Signed   By: Inez Catalina M.D.   On: 02/04/2022 21:36    Impression/Plan: -Melena in setting of NSAID and aspirin use.  Need to rule out ulcer disease.  Mild drop in hemoglobin noted.  Recommendations -------------------------- -Continue supportive care for now.  Tentative plan for EGD tomorrow.  Continue IV PPI.  Okay to have full liquid diet today.  Keep n.p.o. past midnight. -Avoid NSAIDs.  Risks (bleeding, infection, bowel perforation that could require surgery, sedation-related changes in cardiopulmonary systems), benefits (identification and possible treatment of source of symptoms, exclusion of certain causes of symptoms), and alternatives (watchful waiting, radiographic imaging studies, empiric medical treatment)  were explained to patient/family in detail and patient wishes to proceed.     LOS: 0 days   Otis Brace  MD, FACP 02/05/2022, 2:41 PM  Contact #  [phone]

## 2022-02-05 NOTE — H&P (View-Only) (Signed)
Referring Provider: TH Primary Care Physician:  Ardith Dark, MD Primary Gastroenterologist: [facility]/Dr. Madilyn Fireman  Reason for Consultation: GI bleed  HPI: Casey [name] is a 87 y.o. female with past medical history of hypertension, diabetes, dementia presented to the hospital with nausea vomiting and diarrhea.  Subsequently started noticing dark-colored stool.  GI is consulted for further evaluation.  Patient recently started taking ibuprofen for headache.  Initial blood work showed significant leukocytosis WBC of 19, normal hemoglobin.  Likely hemoconcentration.  Normal LFTs.  FOBT positive.  Normal lipase.  CT abdomen pelvis with IV contrast showed no acute GI issues.  Patient seen and examined at bedside.  She started noticing nausea and vomiting recently followed by black-colored stool.  Was also complaining of epigastric as well as left upper quadrant abdominal pain.  Denies any chest pain.  Complaining of headache.  Last colonoscopy in 2009 showed post polypectomy scar in the sigmoid colon.  Biopsy showed hyperplastic changes with lymphoid aggregates.   Past Medical History:  Diagnosis Date   ALLERGIC RHINITIS    Allergy    ANXIETY    ARM PAIN, RIGHT    BENIGN POSITIONAL VERTIGO    Carcinoma in situ of colon    CONSTIPATION    CYSTOCELE WITH INCOMPLETE UTERINE PROLAPSE    Diabetes mellitus without complication (HCC)    ESOPHAGEAL STRICTURE    FATIGUE    FLANK PAIN, LEFT    Headache(784.0)    HIP PAIN, LEFT    HYPERCHOLESTEROLEMIA    HYPERGLYCEMIA    HYPERTENSION, BENIGN ESSENTIAL 03/03/2007   LEG EDEMA, BILATERAL    Microscopic hematuria    OBESITY    OSTEOARTHRITIS, KNEE    OSTEOPOROSIS    Peripheral neuropathy, idiopathic    Problems with swallowing and mastication    SINUSITIS, RECURRENT    SPRAIN&STRAIN OTH SPEC SITES SHOULDER&UPPER ARM     Past Surgical History:  Procedure Laterality Date   COLONOSCOPY     KNEE ARTHROPLASTY Right 07/24/2020   Procedure:  COMPUTER ASSISTED TOTAL KNEE ARTHROPLASTY;  Surgeon: Samson Frederic, MD;  Location: [facility];  Service: Orthopedics;  Laterality: Right;   LEFT HEART CATHETERIZATION WITH CORONARY ANGIOGRAM N/A 12/02/2010   Procedure: LEFT HEART CATHETERIZATION WITH CORONARY ANGIOGRAM;  Surgeon: Peter M Swaziland, MD;  Location: [facility] CATH LAB;  Service: Cardiovascular;:: NORMAL CORONARY ARTERIES - NORMAL LV FUNCTION & EDP.     TRANSTHORACIC ECHOCARDIOGRAM  11/2010   Normal EF 60-65%. No RWMA.  Mild LAD dilation c/w Gr 1 DD.  -- PRETTY NORMAL ECHO    Prior to Admission medications   Medication Sig Start Date End Date Taking? Authorizing Provider  amLODipine (NORVASC) 10 MG tablet Take 1 tablet (10 mg total) by mouth daily. 02/04/22  Yes Ardith Dark, MD  aspirin 81 MG chewable tablet Chew 1 tablet (81 mg total) by mouth 2 (two) times daily. 07/25/20  Yes Darrick Grinder, PA-C  atorvastatin (LIPITOR) 10 MG tablet Take 1 tablet (10 mg total) by mouth daily. 08/19/21  Yes [name], NP  azelastine (ASTELIN) 0.1 % nasal spray INSTILL 1 SPRAY INTO BOTH NOSTRILS TWICE DAILY 08/19/21  Yes [name], NP  azelastine (OPTIVAR) 0.05 % ophthalmic solution Place 1 drop into both eyes 2 (two) times daily.   Yes [provider]  buPROPion (WELLBUTRIN XL) 150 MG 24 hr tablet Take 1 tablet (150 mg total) by mouth daily. 08/19/21  Yes [name], NP  Carboxymethylcellul-Glycerin (REFRESH OPTIVE) 1-0.9 % GEL Place 1 drop  into both eyes daily.   Yes [provider]  celecoxib (CELEBREX) 200 MG capsule    Yes [provider]  cyanocobalamin (VITAMIN B12) 500 MCG tablet    Yes [provider]  donepezil (ARICEPT) 10 MG tablet TAKE 1 TABLET(5 MG) BY MOUTH AT BEDTIME 08/19/21  Yes Maximiano Coss, NP  empagliflozin (JARDIANCE) 10 MG TABS tablet TAKE 1 TABLET(10 MG) BY MOUTH DAILY BEFORE BREAKFAST 01/29/22  Yes Vivi Barrack, MD  escitalopram (LEXAPRO) 10 MG tablet Take 1 tablet (10 mg total) by  mouth daily. 08/19/21  Yes Maximiano Coss, NP  furosemide (LASIX) 40 MG tablet Take 1 tablet (40 mg total) by mouth daily. 08/19/21  Yes Maximiano Coss, NP  HYDROcodone-acetaminophen (NORCO/VICODIN) 5-325 MG tablet Take 1 tablet by mouth every 8 (eight) hours as needed for moderate pain. For chronic pain 01/22/22  Yes Vivi Barrack, MD  hydrOXYzine (ATARAX) 10 MG tablet Take 1 tablet (10 mg total) by mouth 3 (three) times daily as needed. Patient taking differently: Take 20 mg by mouth 3 (three) times daily as needed for anxiety. 12/26/21  Yes Vivi Barrack, MD  lisinopril (ZESTRIL) 40 MG tablet TAKE 1 TABLET(40 MG) BY MOUTH DAILY 02/04/22  Yes Vivi Barrack, MD  meclizine (ANTIVERT) 25 MG tablet Take 1 tablet (25 mg total) by mouth 3 (three) times daily as needed for dizziness. 11/26/21  Yes Vivi Barrack, MD  omeprazole (PRILOSEC) 40 MG capsule Take 40 mg by mouth daily. 11/25/21  Yes [provider]  QUEtiapine (SEROQUEL XR) 50 MG TB24 24 hr tablet Take 2 tablets (100 mg total) by mouth at bedtime. 01/26/22  Yes Vivi Barrack, MD  azithromycin [facility]) 250 MG tablet Take 2 tabs day 1, then 1 tab daily Patient not taking: Reported on 02/05/2022 12/26/21   Vivi Barrack, MD  azithromycin [facility]) 250 MG tablet Take 2 tabs day 1, then 1 tab daily Patient not taking: Reported on 02/05/2022 01/26/22   Vivi Barrack, MD  loperamide (IMODIUM A-D) 2 MG tablet Take 1 tablet (2 mg total) by mouth 2 (two) times daily as needed for diarrhea or loose stools. Patient not taking: Reported on 02/05/2022 04/17/21   Maximiano Coss, NP  pantoprazole (PROTONIX) 40 MG tablet Take 1 tablet (40 mg total) by mouth daily. Patient not taking: Reported on 02/05/2022 09/26/21   Vivi Barrack, MD    Scheduled Meds:  atorvastatin  10 mg Oral Daily   insulin aspart  0-6 Units Subcutaneous Q4H   pantoprazole (PROTONIX) IV  40 mg Intravenous Q12H   QUEtiapine  100 mg Oral QHS   sodium chloride flush  3 mL  Intravenous Q12H   Continuous Infusions: PRN Meds:.acetaminophen, hydrALAZINE, HYDROcodone-acetaminophen, hydrOXYzine, prochlorperazine  Allergies as of 02/04/2022   (No Known Allergies)    Family History  Problem Relation Age of Onset   Coronary artery disease Mother 71   Cancer Father 39   Heart disease Father    Colon cancer Other    Diabetes Other    Coronary artery disease Sister    Diabetes type II Sister    Cancer Sister    Coronary artery disease Brother    Kidney disease Brother    Coronary artery disease Brother    Breast cancer Neg Hx     Social History   Socioeconomic History   Marital status: Single    Spouse name: Not on file   Number of children: 1   Years of  education: 12   Highest education level: High school graduate  Occupational History   Not on file  Tobacco Use   Smoking status: Never   Smokeless tobacco: Never  Vaping Use   Vaping Use: Never used  Substance and Sexual Activity   Alcohol use: No   Drug use: No   Sexual activity: Not Currently  Other Topics Concern   Not on file  Social History Narrative   She tries to stay active, but over the last year (20 19-20 20), she has been very limited by her right knee pain.   Is able to go grocery shopping.   Social Determinants of Health   Financial Resource Strain: Low Risk  (12/25/2016)   Overall Financial Resource Strain (CARDIA)    Difficulty of Paying Living Expenses: Not very hard  Food Insecurity: No Food Insecurity (12/25/2016)   Hunger Vital Sign    Worried About Running Out of Food in the Last Year: Never true    Ran Out of Food in the Last Year: Never true  Transportation Needs: No Transportation Needs (12/25/2016)   PRAPARE - Hydrologist (Medical): No    Lack of Transportation (Non-Medical): No  Physical Activity: Inactive (12/25/2016)   Exercise Vital Sign    Days of Exercise per Week: 0 days    Minutes of Exercise per Session: 0 min  Stress: No  Stress Concern Present (12/25/2016)   [place]    Feeling of Stress : Only a little  Social Connections: Moderately Isolated (12/25/2016)   Social Connection and Isolation Panel [NHANES]    Frequency of Communication with Friends and Family: Once a week    Frequency of Social Gatherings with Friends and Family: Once a week    Attends Religious Services: More than 4 times per year    Active Member of [employer] or Organizations: No    Attends Archivist Meetings: Never    Marital Status: Never married  Intimate Partner Violence: Not At Risk (12/25/2016)   Humiliation, Afraid, Rape, and Kick questionnaire    Fear of Current or Ex-Partner: No    Emotionally Abused: No    Physically Abused: No    Sexually Abused: No    Review of Systems: All negative except as stated above in HPI.  Physical Exam: Vital signs: Vitals:   02/05/22 1100 02/05/22 1300  BP: (!) 132/56 105/86  Pulse: 66 70  Resp: 13 14  Temp:  97.9 F (36.6 C)  SpO2: 95% 96%   Last BM Date : 02/04/22 General:   Alert,  Well-developed, well-nourished, pleasant and cooperative in NAD Lungs: No visible respiratory distress Heart:  Regular rate and rhythm; no murmurs, clicks, rubs,  or gallops. Abdomen: Mild left upper quadrant tenderness to palpation, abdomen is otherwise soft, nondistended, bowel sounds present, no peritoneal signs Alert and oriented x 3 Mood and affect normal Rectal:  Deferred  GI:  Lab Results: Recent Labs    02/04/22 1926 02/05/22 0248 02/05/22 0840  WBC 19.0* 12.0* 10.0  HGB 13.2 11.7* 11.6*  HCT 40.3 35.3* 36.2  PLT 295 276 268   BMET Recent Labs    02/04/22 1926 02/05/22 0025 02/05/22 0248  NA 141 140 143  K 2.4* 3.8 3.4*  CL 99 103 104  CO2 28 28 28   GLUCOSE 263* 171* 159*  BUN 13 10 9   CREATININE 0.96 0.85 0.85  CALCIUM 9.7 8.9 9.0   LFT  Recent Labs    02/04/22 1926  PROT 7.7  ALBUMIN 4.4  AST 30   ALT 21  ALKPHOS 84  BILITOT 0.5   PT/INR No results for input(s): "LABPROT", "INR" in the last 72 hours.   Studies/Results: CT ABDOMEN PELVIS W CONTRAST  Result Date: 02/04/2022 CLINICAL DATA:  Abdominal pain and hematochezia EXAM: CT ABDOMEN AND PELVIS WITH CONTRAST TECHNIQUE: Multidetector CT imaging of the abdomen and pelvis was performed using the standard protocol following bolus administration of intravenous contrast. RADIATION DOSE REDUCTION: This exam was performed according to the departmental dose-optimization program which includes automated exposure control, adjustment of the mA and/or kV according to patient size and/or use of iterative reconstruction technique. CONTRAST:  70mL OMNIPAQUE IOHEXOL 350 MG/ML SOLN COMPARISON:  None Available. FINDINGS: Lower chest: Lung bases are free of acute infiltrate. Minimal scarring is noted bilaterally. Hepatobiliary: Gallbladder is well distended without evidence of cholelithiasis. Liver shows a few scattered hypodensities without significant change on delayed images consistent with small cysts. Biliary ductal system shows no obstructive change. Pancreas: Unremarkable. No pancreatic ductal dilatation or surrounding inflammatory changes. Spleen: Normal in size without focal abnormality. Adrenals/Urinary Tract: Adrenal glands are within normal limits. Right kidney demonstrates multiple cystic lesions. One of these is simple in appearance measuring up to 4.6 cm. The second measures 3.2 cm in demonstrates significant calcification along the wall. Left kidney also demonstrates cystic change which appears simple in nature. The largest of these measures 5.4 cm. Punctate nonobstructing renal stone is noted on the left in the lower pole. No obstructive changes are seen. The bladder is well distended. Stomach/Bowel: The appendix is well visualized. No obstructive or inflammatory changes of the colon are seen. Small bowel and stomach appear within normal limits.  Vascular/Lymphatic: No significant vascular findings are present. No enlarged abdominal or pelvic lymph nodes. Reproductive: Uterus and bilateral adnexa are unremarkable. Other: No free fluid is noted. Small fat containing umbilical hernia is noted. Musculoskeletal: Degenerative changes of lumbar spine are seen. IMPRESSION: Cystic changes are noted within both kidneys. The predominance of these are simple in nature. Some calcifications are noted within 1 of the right renal cysts however. No significant enhancement is noted and the cyst is otherwise simple in appearance. Short-term follow-up with ultrasound of the kidneys in 3-6 months is recommended to assess for stability. No findings to correspond with the given clinical history are noted. Punctate nonobstructing left renal stone. Electronically Signed   By: Mark  Lukens M.D.   On: 02/04/2022 21:36    Impression/Plan: -Melena in setting of NSAID and aspirin use.  Need to rule out ulcer disease.  Mild drop in hemoglobin noted.  Recommendations -------------------------- -Continue supportive care for now.  Tentative plan for EGD tomorrow.  Continue IV PPI.  Okay to have full liquid diet today.  Keep n.p.o. past midnight. -Avoid NSAIDs.  Risks (bleeding, infection, bowel perforation that could require surgery, sedation-related changes in cardiopulmonary systems), benefits (identification and possible treatment of source of symptoms, exclusion of certain causes of symptoms), and alternatives (watchful waiting, radiographic imaging studies, empiric medical treatment)  were explained to patient/family in detail and patient wishes to proceed.     LOS: 0 days   Alayziah Tangeman  MD, FACP 02/05/2022, 2:41 PM  Contact #  [phone]  

## 2022-02-05 NOTE — Progress Notes (Signed)
PROGRESS NOTE    Casey [name]  XLK:[id number] DOB: [birth year] DOA: 02/04/2022 PCP: Vivi Barrack, MD    Brief Narrative:   Casey [name] is a 87 y.o. female with past medical history significant for HTN, DM2, dementia, anxiety, chronic pain with NSAID who presented to [facility] ED on 1/17 from home via EMS with nausea/vomiting and dark tarry stools.  Patient states was in her usual state of health until day prior to admission.  Reports stool was very dark but denies any blood in her vomitus.  Reports some additional mild periumbilical pain.  Patient reports she has chronic pain managed with Norco but also reports taking ibuprofen multiple times daily for her headaches.  Denies chest pain or lightheadedness.  Has been followed by [facility], Dr. Amedeo Plenty in the past.  In the ED, 97.7 F, HR 74, RR 27, BP 139/55, SpO2 98% on room air.  Sodium 141, potassium 2.4, chloride 99, CO2 28, glucose 263, BUN 13, creatinine 0.96.  Lipase 36.  AST 30, ALT 21, total bilirubin 0.5.  WBC 19.0, hemoglobin 13.2, platelets 295.  COVID-19/influenza A/B/RSV PCR negative.  Urinalysis unrevealing.  FOBT positive.  CT abdomen/pelvis with cystic changes noted bilateral kidneys, no significant enhancement noted, punctate nonobstructing left renal stone.  Patient was given oral/IV potassium, IV magnesium, Reglan, Zofran and acetaminophen in the ED.  TRH consulted for admission and further evaluation/management of acute upper GI bleed.  Assessment & Plan:   Upper GI bleed Patient presenting to ED with 1 day history of nausea/vomiting, dark tarry stools in the setting of NSAID abuse with ibuprofen.  Hemoglobin 13.2 on admission, FOBT positive.  CT abdomen/pelvis unrevealing. -- [facility] consulted -- Hgb 13.2>11.7>11.6 -- H/H q6h -- Protonix 40 mg IV every 24 hours -- Holding home aspirin -- N.p.o. pending GI evaluation -- Transfuse for hemoglobin less than 7.0 or active bleeding  Hypokalemia Potassium 2.4 on admission.   Repleted. --Repeat BMP, magnesium level in a.m.  Leukocytosis: Resolved WBC count 19.0 on admission.  No infectious etiology elucidated.  Suspect back to reactive from active GI bleed.  Repeat WBC count now normalized 11.0.  Bilateral renal cysts CT abdomen/pelvis notable for cystic changes bilateral kidneys.  Recommend follow-up ultrasound in 3-6 months outpatient.  Essential hypertension On lisinopril 40 mg p.o. daily, amlodipine 10 mg p.o. daily, HCTZ 12.5 mg p.o. daily at home. -- Holding home and hypertensives in the setting of active GI bleed, current BP 135/69, stable -- Hydralazine 10mg  IV q6h PRN SBP >170 -- Continue monitor BP closely  Type 2 diabetes mellitus Hemoglobin A1c 7.3 on 12/26/2021.  Home regimen includes metformin 500 mg daily, Jardiance 10 mg p.o. daily. -- Hold oral hypoglycemics while inpatient -- SSI for coverage -- CBGs q4h while NPO  Hyperlipidemia: -- Atorvastatin 10 mg p.o. daily  Chronic pain Opioid dependence --Tylenol as needed mild pain -- Norco every 8 hours as needed moderate pain -- Avoidance of NSAIDs  QTc prolongation --Holding home Lexapro, Aricept due to prolonged QTc --Avoid QTc prolonging medications --repeat EKG in am  Anxiety Holding home Lexapro due to QTc prolongation  Dementia --Delirium precautions --Get up during the day --Encourage a familiar face to remain present throughout the day --Keep blinds open and lights on during daylight hours --Minimize the use of opioids/benzodiazepines --Seroquel 100 mg p.o. nightly   DVT prophylaxis: SCDs Start: 02/05/22 0347    Code Status: Full Code Family Communication: No family present at bedside this morning  Disposition Plan:  Level of care: Telemetry Medical Status is: Observation The patient remains OBS appropriate and will d/c before 2 midnights.    Consultants:  [facility] gastroenterology, Dr. Levora Angel  Procedures:  None  Antimicrobials:   None   Subjective: Patient seen examined bedside, resting calmly.  Remains in ED holding area.  No specific complaints this morning.  Denies any further bowel movements today.  Awaiting GI evaluation for consideration of endoscopy given NSAID abuse concerning for gastric ulcer as cause of her GI bleed.  No other questions or concerns at this time.  Denies headache, no dizziness, no chest pain, palpitations, no shortness of breath, no abdominal pain, no fever/chills/night sweats, no current nausea/vomiting, no diarrhea, no cough/congestion, no paresthesias.  No acute events overnight per nursing staff.  Objective: Vitals:   02/05/22 0830 02/05/22 0910 02/05/22 0950 02/05/22 1000  BP: (!) 145/68  137/61 (!) 133/56  Pulse: 66  68 (!) 59  Resp: 17  20 20   Temp: 98.3 F (36.8 C) 98.1 F (36.7 C) 98.1 F (36.7 C)   TempSrc: Oral Oral Oral   SpO2: 99%  100% 93%  Weight:      Height:        Intake/Output Summary (Last 24 hours) at 02/05/2022 1019 Last data filed at 02/05/2022 0019 Gross per 24 hour  Intake 800 ml  Output --  Net 800 ml   Filed Weights   02/04/22 1900  Weight: 66.7 kg    Examination:  Physical Exam: GEN: NAD, alert and oriented x 3, elderly in appearance HEENT: NCAT, PERRL, EOMI, sclera clear, MMM PULM: CTAB w/o wheezes/crackles, normal respiratory effort, on room air CV: RRR w/o M/G/R GI: abd soft, NTND, NABS, no R/G/M MSK: no peripheral edema, moves all extremities independently NEURO: CN II-XII intact, no focal deficits, sensation to light touch intact PSYCH: normal mood/affect Integumentary: dry/intact, no rashes or wounds    Data Reviewed: I have personally reviewed following labs and imaging studies  CBC: Recent Labs  Lab 02/04/22 1926 02/05/22 0248 02/05/22 0840  WBC 19.0* 12.0* 10.0  NEUTROABS 15.7*  --   --   HGB 13.2 11.7* 11.6*  HCT 40.3 35.3* 36.2  MCV 84.8 84.7 86.6  PLT 295 276 268   Basic Metabolic Panel: Recent Labs  Lab  02/04/22 1926 02/05/22 0025 02/05/22 0248  NA 141 140 143  K 2.4* 3.8 3.4*  CL 99 103 104  CO2 28 28 28   GLUCOSE 263* 171* 159*  BUN 13 10 9   CREATININE 0.96 0.85 0.85  CALCIUM 9.7 8.9 9.0  MG  --  2.1  --    GFR: Estimated Creatinine Clearance: 44.1 mL/min (by C-G formula based on SCr of 0.85 mg/dL). Liver Function Tests: Recent Labs  Lab 02/04/22 1926  AST 30  ALT 21  ALKPHOS 84  BILITOT 0.5  PROT 7.7  ALBUMIN 4.4   Recent Labs  Lab 02/04/22 1926  LIPASE 36   No results for input(s): "AMMONIA" in the last 168 hours. Coagulation Profile: No results for input(s): "INR", "PROTIME" in the last 168 hours. Cardiac Enzymes: No results for input(s): "CKTOTAL", "CKMB", "CKMBINDEX", "TROPONINI" in the last 168 hours. BNP (last 3 results) No results for input(s): "PROBNP" in the last 8760 hours. HbA1C: No results for input(s): "HGBA1C" in the last 72 hours. CBG: Recent Labs  Lab 02/05/22 0407 02/05/22 0908  GLUCAP 130* 146*   Lipid Profile: No results for input(s): "CHOL", "HDL", "LDLCALC", "TRIG", "CHOLHDL", "LDLDIRECT" in the last 72 hours. Thyroid  Function Tests: No results for input(s): "TSH", "T4TOTAL", "FREET4", "T3FREE", "THYROIDAB" in the last 72 hours. Anemia Panel: No results for input(s): "VITAMINB12", "FOLATE", "FERRITIN", "TIBC", "IRON", "RETICCTPCT" in the last 72 hours. Sepsis Labs: No results for input(s): "PROCALCITON", "LATICACIDVEN" in the last 168 hours.  Recent Results (from the past 240 hour(s))  Resp panel by RT-PCR (RSV, Flu A&B, Covid) Anterior Nasal Swab     Status: None   Collection Time: 02/04/22  7:32 PM   Specimen: Anterior Nasal Swab  Result Value Ref Range Status   SARS Coronavirus 2 by RT PCR NEGATIVE NEGATIVE Final    Comment: (NOTE) SARS-CoV-2 target nucleic acids are NOT DETECTED.  The SARS-CoV-2 RNA is generally detectable in upper respiratory specimens during the acute phase of infection. The lowest concentration of  SARS-CoV-2 viral copies this assay can detect is 138 copies/mL. A negative result does not preclude SARS-Cov-2 infection and should not be used as the sole basis for treatment or other patient management decisions. A negative result may occur with  improper specimen collection/handling, submission of specimen other than nasopharyngeal swab, presence of viral mutation(s) within the areas targeted by this assay, and inadequate number of viral copies(<138 copies/mL). A negative result must be combined with clinical observations, patient history, and epidemiological information. The expected result is Negative.  Fact Sheet for Patients:  EntrepreneurPulse.com.au  Fact Sheet for Healthcare Providers:  IncredibleEmployment.be  This test is no t yet approved or cleared by the [place] FDA and  has been authorized for detection and/or diagnosis of SARS-CoV-2 by FDA under an Emergency Use Authorization (EUA). This EUA will remain  in effect (meaning this test can be used) for the duration of the COVID-19 declaration under Section 564(b)(1) of the Act, 21 U.S.C.section 360bbb-3(b)(1), unless the authorization is terminated  or revoked sooner.       Influenza A by PCR NEGATIVE NEGATIVE Final   Influenza B by PCR NEGATIVE NEGATIVE Final    Comment: (NOTE) The Xpert Xpress SARS-CoV-2/FLU/RSV plus assay is intended as an aid in the diagnosis of influenza from Nasopharyngeal swab specimens and should not be used as a sole basis for treatment. Nasal washings and aspirates are unacceptable for Xpert Xpress SARS-CoV-2/FLU/RSV testing.  Fact Sheet for Patients: EntrepreneurPulse.com.au  Fact Sheet for Healthcare Providers: IncredibleEmployment.be  This test is not yet approved or cleared by the [place] FDA and has been authorized for detection and/or diagnosis of SARS-CoV-2 by FDA under an Emergency Use  Authorization (EUA). This EUA will remain in effect (meaning this test can be used) for the duration of the COVID-19 declaration under Section 564(b)(1) of the Act, 21 U.S.C. section 360bbb-3(b)(1), unless the authorization is terminated or revoked.     Resp Syncytial Virus by PCR NEGATIVE NEGATIVE Final    Comment: (NOTE) Fact Sheet for Patients: EntrepreneurPulse.com.au  Fact Sheet for Healthcare Providers: IncredibleEmployment.be  This test is not yet approved or cleared by the [place] FDA and has been authorized for detection and/or diagnosis of SARS-CoV-2 by FDA under an Emergency Use Authorization (EUA). This EUA will remain in effect (meaning this test can be used) for the duration of the COVID-19 declaration under Section 564(b)(1) of the Act, 21 U.S.C. section 360bbb-3(b)(1), unless the authorization is terminated or revoked.  Performed at [facility], [place] [address], [place], [place] [address]          Radiology Studies: CT ABDOMEN PELVIS W CONTRAST  Result Date: 02/04/2022 CLINICAL DATA:  Abdominal pain and  hematochezia EXAM: CT ABDOMEN AND PELVIS WITH CONTRAST TECHNIQUE: Multidetector CT imaging of the abdomen and pelvis was performed using the standard protocol following bolus administration of intravenous contrast. RADIATION DOSE REDUCTION: This exam was performed according to the departmental dose-optimization program which includes automated exposure control, adjustment of the mA and/or kV according to patient size and/or use of iterative reconstruction technique. CONTRAST:  55mL OMNIPAQUE IOHEXOL 350 MG/ML SOLN COMPARISON:  None Available. FINDINGS: Lower chest: Lung bases are free of acute infiltrate. Minimal scarring is noted bilaterally. Hepatobiliary: Gallbladder is well distended without evidence of cholelithiasis. Liver shows a few scattered hypodensities without significant change on delayed images consistent  with small cysts. Biliary ductal system shows no obstructive change. Pancreas: Unremarkable. No pancreatic ductal dilatation or surrounding inflammatory changes. Spleen: Normal in size without focal abnormality. Adrenals/Urinary Tract: Adrenal glands are within normal limits. Right kidney demonstrates multiple cystic lesions. One of these is simple in appearance measuring up to 4.6 cm. The second measures 3.2 cm in demonstrates significant calcification along the wall. Left kidney also demonstrates cystic change which appears simple in nature. The largest of these measures 5.4 cm. Punctate nonobstructing renal stone is noted on the left in the lower pole. No obstructive changes are seen. The bladder is well distended. Stomach/Bowel: The appendix is well visualized. No obstructive or inflammatory changes of the colon are seen. Small bowel and stomach appear within normal limits. Vascular/Lymphatic: No significant vascular findings are present. No enlarged abdominal or pelvic lymph nodes. Reproductive: Uterus and bilateral adnexa are unremarkable. Other: No free fluid is noted. Small fat containing umbilical hernia is noted. Musculoskeletal: Degenerative changes of lumbar spine are seen. IMPRESSION: Cystic changes are noted within both kidneys. The predominance of these are simple in nature. Some calcifications are noted within 1 of the right renal cysts however. No significant enhancement is noted and the cyst is otherwise simple in appearance. Short-term follow-up with ultrasound of the kidneys in 3-6 months is recommended to assess for stability. No findings to correspond with the given clinical history are noted. Punctate nonobstructing left renal stone. Electronically Signed   By: Alcide Clever M.D.   On: 02/04/2022 21:36        Scheduled Meds:  atorvastatin  10 mg Oral Daily   insulin aspart  0-6 Units Subcutaneous Q4H   pantoprazole (PROTONIX) IV  40 mg Intravenous Q12H   QUEtiapine  100 mg Oral QHS    sodium chloride flush  3 mL Intravenous Q12H   Continuous Infusions:   LOS: 0 days    Time spent: 52 minutes spent on chart review, discussion with nursing staff, consultants, updating family and interview/physical exam; more than 50% of that time was spent in counseling and/or coordination of care.    Alvira Philips [place], DO [facility] Available via Epic secure chat 7am-7pm After these hours, please refer to coverage provider listed on [email or web address] 02/05/2022, 10:19 AM

## 2022-02-05 NOTE — ED Notes (Signed)
Took over pt care at this time.

## 2022-02-05 NOTE — H&P (Signed)
History and Physical    Casey [name] EVO:[id number] DOB: September 27, 1935 DOA: 02/04/2022  PCP: Vivi Barrack, MD   Patient coming from: Home   Chief Complaint: N/V/D    HPI: Casey [name] is a pleasant 87 y.o. female with medical history significant for chronic pain, hypertension, type 2 diabetes mellitus, dementia, and anxiety who presents to the ED with nausea, vomiting, and diarrhea.   She was in her usual state of health until yesterday when she developed nausea, vomiting, and diarrhea.  She states that the stool was very dark.  She did not see any blood in her vomit.  She has had some mild periumbilical pain associated with this.  She has chronic pain managed with Norco but also reports taking ibuprofen multiple times daily for her headaches.  She takes Protonix once daily.  There has not been any chest pain or lightheadedness. She reports seeing Dr. Amedeo Plenty of [facility] previously for colonoscopies.   ED Course: Upon arrival to the ED, patient is found to be afebrile and saturating well on room air with normal heart rate and systolic blood pressure of 110 and greater.  EKG features sinus rhythm with LAD and QTc of 502.  Chemistry panel is notable for potassium 2.4.  WBC is 19,000 and initial hemoglobin 13.2.  FOBT was positive.  Repeat hemoglobin 7 hours later is 11.7.  CT of the abdomen and pelvis is negative for acute intra-abdominal or pelvic abnormality.   She was given oral and IV potassium, IV magnesium, Reglan, Zofran, and acetaminophen in the ED.  Review of Systems:  All other systems reviewed and apart from HPI, are negative.  Past Medical History:  Diagnosis Date   ALLERGIC RHINITIS    Allergy    ANXIETY    ARM PAIN, RIGHT    BENIGN POSITIONAL VERTIGO    Carcinoma in situ of colon    CONSTIPATION    CYSTOCELE WITH INCOMPLETE UTERINE PROLAPSE    Diabetes mellitus without complication ([place])    ESOPHAGEAL STRICTURE    FATIGUE    FLANK PAIN, LEFT    Headache(784.0)    HIP  PAIN, LEFT    HYPERCHOLESTEROLEMIA    HYPERGLYCEMIA    HYPERTENSION, BENIGN ESSENTIAL 03/03/2007   LEG EDEMA, BILATERAL    Microscopic hematuria    OBESITY    OSTEOARTHRITIS, KNEE    OSTEOPOROSIS    Peripheral neuropathy, idiopathic    Problems with swallowing and mastication    SINUSITIS, RECURRENT    SPRAIN&STRAIN OTH SPEC SITES SHOULDER&UPPER ARM     Past Surgical History:  Procedure Laterality Date   COLONOSCOPY     KNEE ARTHROPLASTY Right 07/24/2020   Procedure: COMPUTER ASSISTED TOTAL KNEE ARTHROPLASTY;  Surgeon: Rod Can, MD;  Location: [facility];  Service: Orthopedics;  Laterality: Right;   LEFT HEART CATHETERIZATION WITH CORONARY ANGIOGRAM N/A 12/02/2010   Procedure: LEFT HEART CATHETERIZATION WITH CORONARY ANGIOGRAM;  Surgeon: Peter M Martinique, MD;  Location: [facility] CATH LAB;  Service: Cardiovascular;:: NORMAL CORONARY ARTERIES - NORMAL LV FUNCTION & EDP.     TRANSTHORACIC ECHOCARDIOGRAM  11/2010   Normal EF 60-65%. No RWMA.  Mild LAD dilation c/w Gr 1 DD.  -- PRETTY NORMAL ECHO    Social History:   reports that she has never smoked. She has never used smokeless tobacco. She reports that she does not drink alcohol and does not use drugs.  No Known Allergies  Family History  Problem Relation Age of Onset   Coronary artery disease  Mother 28   Cancer Father 50   Heart disease Father    Colon cancer Other    Diabetes Other    Coronary artery disease Sister    Diabetes type II Sister    Cancer Sister    Coronary artery disease Brother    Kidney disease Brother    Coronary artery disease Brother    Breast cancer Neg Hx      Prior to Admission medications   Medication Sig Start Date End Date Taking? Authorizing Provider  amLODipine (NORVASC) 10 MG tablet Take 1 tablet (10 mg total) by mouth daily. 02/04/22   Vivi Barrack, MD  aspirin 81 MG chewable tablet Chew 1 tablet (81 mg total) by mouth 2 (two) times daily. 07/25/20   Dorothyann Peng, PA-C  atorvastatin  (LIPITOR) 10 MG tablet Take 1 tablet (10 mg total) by mouth daily. 08/19/21   Maximiano Coss, NP  azelastine (ASTELIN) 0.1 % nasal spray INSTILL 1 SPRAY INTO BOTH NOSTRILS TWICE DAILY 08/19/21   Maximiano Coss, NP  azelastine (OPTIVAR) 0.05 % ophthalmic solution Place 1 drop into both eyes 2 (two) times daily.    [provider]  azithromycin (ZITHROMAX) 250 MG tablet Take 2 tabs day 1, then 1 tab daily 12/26/21   Vivi Barrack, MD  azithromycin (ZITHROMAX) 250 MG tablet Take 2 tabs day 1, then 1 tab daily 01/26/22   Vivi Barrack, MD  buPROPion (WELLBUTRIN XL) 150 MG 24 hr tablet Take 1 tablet (150 mg total) by mouth daily. 08/19/21   Maximiano Coss, NP  busPIRone (BUSPAR) 5 MG tablet     [provider]  Carboxymethylcellul-Glycerin (REFRESH OPTIVE) 1-0.9 % GEL Place 1 drop into both eyes daily.    [provider]  celecoxib (CELEBREX) 200 MG capsule     [provider]  cyanocobalamin (VITAMIN B12) 500 MCG tablet     [provider]  donepezil (ARICEPT) 10 MG tablet TAKE 1 TABLET(5 MG) BY MOUTH AT BEDTIME 08/19/21   Maximiano Coss, NP  empagliflozin (JARDIANCE) 10 MG TABS tablet TAKE 1 TABLET(10 MG) BY MOUTH DAILY BEFORE BREAKFAST 01/29/22   Vivi Barrack, MD  escitalopram (LEXAPRO) 10 MG tablet Take 1 tablet (10 mg total) by mouth daily. 08/19/21   Maximiano Coss, NP  furosemide (LASIX) 40 MG tablet Take 1 tablet (40 mg total) by mouth daily. 08/19/21   Maximiano Coss, NP  hydrochlorothiazide (HYDRODIURIL) 12.5 MG tablet     [provider]  HYDROcodone-acetaminophen (NORCO/VICODIN) 5-325 MG tablet Take 1 tablet by mouth every 8 (eight) hours as needed for moderate pain. For chronic pain 01/22/22   Vivi Barrack, MD  hydrOXYzine (ATARAX) 10 MG tablet Take 1 tablet (10 mg total) by mouth 3 (three) times daily as needed. 12/26/21   Vivi Barrack, MD  lisinopril (ZESTRIL) 40 MG tablet TAKE 1 TABLET(40 MG) BY MOUTH DAILY 02/04/22   Vivi Barrack,  MD  loperamide (IMODIUM A-D) 2 MG tablet Take 1 tablet (2 mg total) by mouth 2 (two) times daily as needed for diarrhea or loose stools. 04/17/21   Maximiano Coss, NP  meclizine (ANTIVERT) 25 MG tablet Take 1 tablet (25 mg total) by mouth 3 (three) times daily as needed for dizziness. 11/26/21   Vivi Barrack, MD  metFORMIN (GLUCOPHAGE) 500 MG tablet 500 mg by oral route.    [provider]  ondansetron (ZOFRAN) 4 MG tablet     [provider]  pantoprazole ([place])  40 MG tablet Take 1 tablet (40 mg total) by mouth daily. 09/26/21   Vivi Barrack, MD  QUEtiapine (SEROQUEL XR) 50 MG TB24 24 hr tablet Take 2 tablets (100 mg total) by mouth at bedtime. 01/26/22   Vivi Barrack, MD    Physical Exam: Vitals:   02/05/22 0130 02/05/22 0200 02/05/22 0300 02/05/22 0346  BP: 126/64 118/62 (!) 129/56   Pulse: 69 63 70   Resp: 14 13 11    Temp:    97.8 F (36.6 C)  TempSrc:    Oral  SpO2: 99% 100% 99%   Weight:      Height:        Constitutional: NAD, calm  Eyes: PERTLA, lids and conjunctivae normal ENMT: Mucous membranes are moist. Posterior pharynx clear of any exudate or lesions.   Neck: supple, no masses  Respiratory: no wheezing, no crackles. No accessory muscle use.  Cardiovascular: S1 & S2 heard, regular rate and rhythm. No JVD. Abdomen: No distension, soft, no rebound pain or guarding. Bowel sounds active.  Musculoskeletal: no clubbing / cyanosis. No joint deformity upper and lower extremities.   Skin: no significant rashes, lesions, ulcers. Warm, dry, well-perfused. Neurologic: CN 2-12 grossly intact. Moving all extremities. Alert and oriented.  Psychiatric: Pleasant. Cooperative.    Labs and Imaging on Admission: I have personally reviewed following labs and imaging studies  CBC: Recent Labs  Lab 02/04/22 1926 02/05/22 0248  WBC 19.0* 12.0*  NEUTROABS 15.7*  --   HGB 13.2 11.7*  HCT 40.3 35.3*  MCV 84.8 84.7  PLT 295 [id number]   Basic Metabolic  Panel: Recent Labs  Lab 02/04/22 1926 02/05/22 0025 02/05/22 0248  NA 141 140 143  K 2.4* 3.8 3.4*  CL 99 103 104  CO2 28 28 28   GLUCOSE 263* 171* 159*  BUN 13 10 9   CREATININE 0.96 0.85 0.85  CALCIUM 9.7 8.9 9.0  MG  --  2.1  --    GFR: Estimated Creatinine Clearance: 44.1 mL/min (by C-G formula based on SCr of 0.85 mg/dL). Liver Function Tests: Recent Labs  Lab 02/04/22 1926  AST 30  ALT 21  ALKPHOS 84  BILITOT 0.5  PROT 7.7  ALBUMIN 4.4   Recent Labs  Lab 02/04/22 1926  LIPASE 36   No results for input(s): "AMMONIA" in the last 168 hours. Coagulation Profile: No results for input(s): "INR", "PROTIME" in the last 168 hours. Cardiac Enzymes: No results for input(s): "CKTOTAL", "CKMB", "CKMBINDEX", "TROPONINI" in the last 168 hours. BNP (last 3 results) No results for input(s): "PROBNP" in the last 8760 hours. HbA1C: No results for input(s): "HGBA1C" in the last 72 hours. CBG: No results for input(s): "GLUCAP" in the last 168 hours. Lipid Profile: No results for input(s): "CHOL", "HDL", "LDLCALC", "TRIG", "CHOLHDL", "LDLDIRECT" in the last 72 hours. Thyroid Function Tests: No results for input(s): "TSH", "T4TOTAL", "FREET4", "T3FREE", "THYROIDAB" in the last 72 hours. Anemia Panel: No results for input(s): "VITAMINB12", "FOLATE", "FERRITIN", "TIBC", "IRON", "RETICCTPCT" in the last 72 hours. Urine analysis:    Component Value Date/Time   COLORURINE STRAW (A) 02/04/2022 2211   APPEARANCEUR CLEAR 02/04/2022 2211   APPEARANCEUR Clear 04/04/2018 1157   LABSPEC 1.017 02/04/2022 2211   PHURINE 7.0 02/04/2022 2211   GLUCOSEU >=500 (A) 02/04/2022 2211   GLUCOSEU NEGATIVE 04/15/2021 0942   HGBUR SMALL (A) 02/04/2022 2211   HGBUR trace-intact 12/09/2009 0000   BILIRUBINUR NEGATIVE 02/04/2022 2211   BILIRUBINUR Negative 04/04/2018 1157   KETONESUR NEGATIVE  02/04/2022 2211   PROTEINUR NEGATIVE 02/04/2022 2211   UROBILINOGEN 0.2 04/15/2021 0942   NITRITE  NEGATIVE 02/04/2022 2211   LEUKOCYTESUR NEGATIVE 02/04/2022 2211   Sepsis Labs: @LABRCNTIP (procalcitonin:4,lacticidven:4) ) Recent Results (from the past 240 hour(s))  Resp panel by RT-PCR (RSV, Flu A&B, Covid) Anterior Nasal Swab     Status: None   Collection Time: 02/04/22  7:32 PM   Specimen: Anterior Nasal Swab  Result Value Ref Range Status   SARS Coronavirus 2 by RT PCR NEGATIVE NEGATIVE Final    Comment: (NOTE) SARS-CoV-2 target nucleic acids are NOT DETECTED.  The SARS-CoV-2 RNA is generally detectable in upper respiratory specimens during the acute phase of infection. The lowest concentration of SARS-CoV-2 viral copies this assay can detect is 138 copies/mL. A negative result does not preclude SARS-Cov-2 infection and should not be used as the sole basis for treatment or other patient management decisions. A negative result may occur with  improper specimen collection/handling, submission of specimen other than nasopharyngeal swab, presence of viral mutation(s) within the areas targeted by this assay, and inadequate number of viral copies(<138 copies/mL). A negative result must be combined with clinical observations, patient history, and epidemiological information. The expected result is Negative.  Fact Sheet for Patients:  EntrepreneurPulse.com.au  Fact Sheet for Healthcare Providers:  IncredibleEmployment.be  This test is no t yet approved or cleared by the [place] FDA and  has been authorized for detection and/or diagnosis of SARS-CoV-2 by FDA under an Emergency Use Authorization (EUA). This EUA will remain  in effect (meaning this test can be used) for the duration of the COVID-19 declaration under Section 564(b)(1) of the Act, 21 U.S.C.section 360bbb-3(b)(1), unless the authorization is terminated  or revoked sooner.       Influenza A by PCR NEGATIVE NEGATIVE Final   Influenza B by PCR NEGATIVE NEGATIVE Final     Comment: (NOTE) The Xpert Xpress SARS-CoV-2/FLU/RSV plus assay is intended as an aid in the diagnosis of influenza from Nasopharyngeal swab specimens and should not be used as a sole basis for treatment. Nasal washings and aspirates are unacceptable for Xpert Xpress SARS-CoV-2/FLU/RSV testing.  Fact Sheet for Patients: EntrepreneurPulse.com.au  Fact Sheet for Healthcare Providers: IncredibleEmployment.be  This test is not yet approved or cleared by the [place] FDA and has been authorized for detection and/or diagnosis of SARS-CoV-2 by FDA under an Emergency Use Authorization (EUA). This EUA will remain in effect (meaning this test can be used) for the duration of the COVID-19 declaration under Section 564(b)(1) of the Act, 21 U.S.C. section 360bbb-3(b)(1), unless the authorization is terminated or revoked.     Resp Syncytial Virus by PCR NEGATIVE NEGATIVE Final    Comment: (NOTE) Fact Sheet for Patients: EntrepreneurPulse.com.au  Fact Sheet for Healthcare Providers: IncredibleEmployment.be  This test is not yet approved or cleared by the [place] FDA and has been authorized for detection and/or diagnosis of SARS-CoV-2 by FDA under an Emergency Use Authorization (EUA). This EUA will remain in effect (meaning this test can be used) for the duration of the COVID-19 declaration under Section 564(b)(1) of the Act, 21 U.S.C. section 360bbb-3(b)(1), unless the authorization is terminated or revoked.  Performed at [facility], [place] [address], [place], [place] [address]      Radiological Exams on Admission: CT ABDOMEN PELVIS W CONTRAST  Result Date: 02/04/2022 CLINICAL DATA:  Abdominal pain and hematochezia EXAM: CT ABDOMEN AND PELVIS WITH CONTRAST TECHNIQUE: Multidetector CT imaging of the abdomen and pelvis  was performed using the standard protocol following bolus administration of  intravenous contrast. RADIATION DOSE REDUCTION: This exam was performed according to the departmental dose-optimization program which includes automated exposure control, adjustment of the mA and/or kV according to patient size and/or use of iterative reconstruction technique. CONTRAST:  11mL OMNIPAQUE IOHEXOL 350 MG/ML SOLN COMPARISON:  None Available. FINDINGS: Lower chest: Lung bases are free of acute infiltrate. Minimal scarring is noted bilaterally. Hepatobiliary: Gallbladder is well distended without evidence of cholelithiasis. Liver shows a few scattered hypodensities without significant change on delayed images consistent with small cysts. Biliary ductal system shows no obstructive change. Pancreas: Unremarkable. No pancreatic ductal dilatation or surrounding inflammatory changes. Spleen: Normal in size without focal abnormality. Adrenals/Urinary Tract: Adrenal glands are within normal limits. Right kidney demonstrates multiple cystic lesions. One of these is simple in appearance measuring up to 4.6 cm. The second measures 3.2 cm in demonstrates significant calcification along the wall. Left kidney also demonstrates cystic change which appears simple in nature. The largest of these measures 5.4 cm. Punctate nonobstructing renal stone is noted on the left in the lower pole. No obstructive changes are seen. The bladder is well distended. Stomach/Bowel: The appendix is well visualized. No obstructive or inflammatory changes of the colon are seen. Small bowel and stomach appear within normal limits. Vascular/Lymphatic: No significant vascular findings are present. No enlarged abdominal or pelvic lymph nodes. Reproductive: Uterus and bilateral adnexa are unremarkable. Other: No free fluid is noted. Small fat containing umbilical hernia is noted. Musculoskeletal: Degenerative changes of lumbar spine are seen. IMPRESSION: Cystic changes are noted within both kidneys. The predominance of these are simple in nature.  Some calcifications are noted within 1 of the right renal cysts however. No significant enhancement is noted and the cyst is otherwise simple in appearance. Short-term follow-up with ultrasound of the kidneys in 3-6 months is recommended to assess for stability. No findings to correspond with the given clinical history are noted. Punctate nonobstructing left renal stone. Electronically Signed   By: Inez Catalina M.D.   On: 02/04/2022 21:36    EKG: Independently reviewed. Sinus rhythm, LAD, QTc 502 ms.   Assessment/Plan   1. GI bleeding  - She denies hematemesis and BUN is normal but stool has been very dark per pt report and she uses NSAIDs multiple times daily (with daily PPI)   - She has been hemodynamically stable; Hgb 13.2 initially and 11.7 seven hrs later  - Start IV PPI, keep NPO for now, trend H&H, consult GI   2. Hypokalemia  - Improved to 3.4 after replacement in ED  - Continue replacement   3. Prolonged QT interval  - QTc 502 ms in ED  - Replace potassium, avoid QT-prolonging medications    4. Hypertension  - Hold antihypertensives initially in setting of acute GIB and resume if BP remains stable   5. Type II DM  - A1c was 7.3% in December 2023  - Check CBG and use low-intensity SSI for now   6. Dementia, anxiety  - Continue Seroquel and as-needed Atarax, use delirium precautions, hold Aricept and Lexapro initially in light of prolonged QT     DVT prophylaxis: SCDs  Code Status: Full  Level of Care: Level of care: Telemetry Medical Family Communication: None present   Disposition Plan:  Patient is from: home  Anticipated d/c is to: home  Anticipated d/c date is: Possibly as early as 1/19 or 02/07/22  Patient currently: Pending stable H&H  Consults called:  Secure chat sent to Dr. Dulce Sellar of [facility] with request for routine consult   Admission status: Observation     Casey Deutscher, MD [facility]  02/05/2022, 3:49 AM

## 2022-02-05 NOTE — ED Notes (Signed)
ED TO INPATIENT HANDOFF REPORT  ED Nurse Name and Phone #: Marye Round RN [phone]  S Name/Age/Gender Casey [name] 87 y.o. female Room/Bed: 043C/043C  Code Status   Code Status: Full Code  Home/SNF/Other Home Patient oriented to: self, place, time, and situation Is this baseline? Yes   Triage Complete: Triage complete  Chief Complaint GI bleeding [K92.2]  Triage Note Pt arrived via [facility] for hematochezia and N/V x1 day from home. Pt reports dark, tarry stool, EMS noted same on scene. Multiple episodes of vomiting in route, non-bloody. Abdomen soft, non-tender. Afebrile. GCS 15, A&Ox4.   BP 173/73 (106) HR 66 NS RR 18 SPO2 92% CBG 282   Allergies No Known Allergies  Level of Care/Admitting Diagnosis ED Disposition     ED Disposition  Admit   Condition  --   Comment  Hospital Area: [facility] [[id number]]  Level of Care: Telemetry Medical [104]  May place patient in observation at [facility] or [facility] if equivalent level of care is available:: Yes  Covid Evaluation: Confirmed COVID Negative  Diagnosis: GI bleeding [id number]  Admitting Physician: Vianne Bulls [id number]  Attending Physician: Vianne Bulls [id number]          B Medical/Surgery History Past Medical History:  Diagnosis Date   ALLERGIC RHINITIS    Allergy    ANXIETY    ARM PAIN, RIGHT    BENIGN POSITIONAL VERTIGO    Carcinoma in situ of colon    CONSTIPATION    CYSTOCELE WITH INCOMPLETE UTERINE PROLAPSE    Diabetes mellitus without complication ([place])    ESOPHAGEAL STRICTURE    FATIGUE    FLANK PAIN, LEFT    Headache(784.0)    HIP PAIN, LEFT    HYPERCHOLESTEROLEMIA    HYPERGLYCEMIA    HYPERTENSION, BENIGN ESSENTIAL 03/03/2007   LEG EDEMA, BILATERAL    Microscopic hematuria    OBESITY    OSTEOARTHRITIS, KNEE    OSTEOPOROSIS    Peripheral neuropathy, idiopathic    Problems with swallowing and mastication    SINUSITIS, RECURRENT    SPRAIN&STRAIN OTH  SPEC SITES SHOULDER&UPPER ARM    Past Surgical History:  Procedure Laterality Date   COLONOSCOPY     KNEE ARTHROPLASTY Right 07/24/2020   Procedure: COMPUTER ASSISTED TOTAL KNEE ARTHROPLASTY;  Surgeon: Rod Can, MD;  Location: [facility];  Service: Orthopedics;  Laterality: Right;   LEFT HEART CATHETERIZATION WITH CORONARY ANGIOGRAM N/A 12/02/2010   Procedure: LEFT HEART CATHETERIZATION WITH CORONARY ANGIOGRAM;  Surgeon: Peter M Martinique, MD;  Location: [facility] C[facility] LAB;  Service: Cardiovascular;:: NORMAL CORONARY ARTERIES - NORMAL LV FUNCTION & EDP.     TRANSTHORACIC ECHOCARDIOGRAM  11/2010   Normal EF 60-65%. No RWMA.  Mild LAD dilation c/w Gr 1 DD.  -- PRETTY NORMAL ECHO     A IV Location/Drains/Wounds Patient Lines/Drains/Airways Status     Active Line/Drains/Airways     Name Placement date Placement time Site Days   Peripheral IV 02/04/22 20 G Right Antecubital 02/04/22  1927  Antecubital  1            Intake/Output Last 24 hours  Intake/Output Summary (Last 24 hours) at 02/05/2022 1313 Last data filed at 02/05/2022 0019 Gross per 24 hour  Intake 800 ml  Output --  Net 800 ml    Labs/Imaging Results for orders placed or performed during the hospital encounter of 02/04/22 (from the past 48 hour(s))  CBC with Differential     Status: Abnormal  Collection Time: 02/04/22  7:26 PM  Result Value Ref Range   WBC 19.0 (H) 4.0 - 10.5 K/uL   RBC 4.75 3.87 - 5.11 MIL/uL   Hemoglobin 13.2 12.0 - 15.0 g/dL   HCT 40.3 36.0 - 46.0 %   MCV 84.8 80.0 - 100.0 fL   MCH 27.8 26.0 - 34.0 pg   MCHC 32.8 30.0 - 36.0 g/dL   RDW 14.9 11.5 - 15.5 %   Platelets 295 150 - 400 K/uL   nRBC 0.0 0.0 - 0.2 %   Neutrophils Relative % 82 %   Neutro Abs 15.7 (H) 1.7 - 7.7 K/uL   Lymphocytes Relative 11 %   Lymphs Abs 2.1 0.7 - 4.0 K/uL   Monocytes Relative 5 %   Monocytes Absolute 1.0 0.1 - 1.0 K/uL   Eosinophils Relative 0 %   Eosinophils Absolute 0.0 0.0 - 0.5 K/uL   Basophils Relative 1 %    Basophils Absolute 0.1 0.0 - 0.1 K/uL   Immature Granulocytes 1 %   Abs Immature Granulocytes 0.09 (H) 0.00 - 0.07 K/uL    Comment: Performed at [facility] [address], [place], [place] [address]  Comprehensive metabolic panel     Status: Abnormal   Collection Time: 02/04/22  7:26 PM  Result Value Ref Range   Sodium 141 135 - 145 mmol/L   Potassium 2.4 (LL) 3.5 - 5.1 mmol/L    Comment: CRITICAL RESULT CALLED TO, READ BACK BY AND VERIFIED WITH C.NOVIELLO,RN @2044  02/04/2022 VANG.J   Chloride 99 98 - 111 mmol/L   CO2 28 22 - 32 mmol/L   Glucose, Bld 263 (H) 70 - 99 mg/dL    Comment: Glucose reference range applies only to samples taken after fasting for at least 8 hours.   BUN 13 8 - 23 mg/dL   Creatinine, Ser 0.96 0.44 - 1.00 mg/dL   Calcium 9.7 8.9 - 10.3 mg/dL   Total Protein 7.7 6.5 - 8.1 g/dL   Albumin 4.4 3.5 - 5.0 g/dL   AST 30 15 - 41 U/L   ALT 21 0 - 44 U/L   Alkaline Phosphatase 84 38 - 126 U/L   Total Bilirubin 0.5 0.3 - 1.2 mg/dL   GFR, Estimated 58 (L) >60 mL/min    Comment: (NOTE) Calculated using the CKD-EPI Creatinine Equation (2021)    Anion gap 14 5 - 15    Comment: Performed at [facility] [address], [place], [place] [address]  Lipase, blood     Status: None   Collection Time: 02/04/22  7:26 PM  Result Value Ref Range   Lipase 36 11 - 51 U/L    Comment: Performed at [facility], [place] [address] [place], [place] [address]  Resp panel by RT-PCR (RSV, Flu A&B, Covid) Anterior Nasal Swab     Status: None   Collection Time: 02/04/22  7:32 PM   Specimen: Anterior Nasal Swab  Result Value Ref Range   SARS Coronavirus 2 by RT PCR NEGATIVE NEGATIVE    Comment: (NOTE) SARS-CoV-2 target nucleic acids are NOT DETECTED.  The SARS-CoV-2 RNA is generally detectable in upper respiratory specimens during the acute phase of infection. The lowest concentration of SARS-CoV-2 viral copies this assay can detect is 138 copies/mL. A negative  result does not preclude SARS-Cov-2 infection and should not be used as the sole basis for treatment or other patient management decisions. A negative result may occur with  improper specimen  collection/handling, submission of specimen other than nasopharyngeal swab, presence of viral mutation(s) within the areas targeted by this assay, and inadequate number of viral copies(<138 copies/mL). A negative result must be combined with clinical observations, patient history, and epidemiological information. The expected result is Negative.  Fact Sheet for Patients:  EntrepreneurPulse.com.au  Fact Sheet for Healthcare Providers:  IncredibleEmployment.be  This test is no t yet approved or cleared by the [place] FDA and  has been authorized for detection and/or diagnosis of SARS-CoV-2 by FDA under an Emergency Use Authorization (EUA). This EUA will remain  in effect (meaning this test can be used) for the duration of the COVID-19 declaration under Section 564(b)(1) of the Act, 21 U.S.C.section 360bbb-3(b)(1), unless the authorization is terminated  or revoked sooner.       Influenza A by PCR NEGATIVE NEGATIVE   Influenza B by PCR NEGATIVE NEGATIVE    Comment: (NOTE) The Xpert Xpress SARS-CoV-2/FLU/RSV plus assay is intended as an aid in the diagnosis of influenza from Nasopharyngeal swab specimens and should not be used as a sole basis for treatment. Nasal washings and aspirates are unacceptable for Xpert Xpress SARS-CoV-2/FLU/RSV testing.  Fact Sheet for Patients: EntrepreneurPulse.com.au  Fact Sheet for Healthcare Providers: IncredibleEmployment.be  This test is not yet approved or cleared by the [place] FDA and has been authorized for detection and/or diagnosis of SARS-CoV-2 by FDA under an Emergency Use Authorization (EUA). This EUA will remain in effect (meaning this test can be used) for the  duration of the COVID-19 declaration under Section 564(b)(1) of the Act, 21 U.S.C. section 360bbb-3(b)(1), unless the authorization is terminated or revoked.     Resp Syncytial Virus by PCR NEGATIVE NEGATIVE    Comment: (NOTE) Fact Sheet for Patients: EntrepreneurPulse.com.au  Fact Sheet for Healthcare Providers: IncredibleEmployment.be  This test is not yet approved or cleared by the [place] FDA and has been authorized for detection and/or diagnosis of SARS-CoV-2 by FDA under an Emergency Use Authorization (EUA). This EUA will remain in effect (meaning this test can be used) for the duration of the COVID-19 declaration under Section 564(b)(1) of the Act, 21 U.S.C. section 360bbb-3(b)(1), unless the authorization is terminated or revoked.  Performed at [facility], [place] [address] [place], [place] [address]   Urinalysis, Routine w reflex microscopic     Status: Abnormal   Collection Time: 02/04/22 10:11 PM  Result Value Ref Range   Color, Urine STRAW (A) YELLOW   APPearance CLEAR CLEAR   Specific Gravity, Urine 1.017 1.005 - 1.030   pH 7.0 5.0 - 8.0   Glucose, UA >=500 (A) NEGATIVE mg/dL   Hgb urine dipstick SMALL (A) NEGATIVE   Bilirubin Urine NEGATIVE NEGATIVE   Ketones, ur NEGATIVE NEGATIVE mg/dL   Protein, ur NEGATIVE NEGATIVE mg/dL   Nitrite NEGATIVE NEGATIVE   Leukocytes,Ua NEGATIVE NEGATIVE   RBC / HPF 0-5 0 - 5 RBC/hpf   WBC, UA 0-5 0 - 5 WBC/hpf   Bacteria, UA NONE SEEN NONE SEEN   Squamous Epithelial / HPF 0-5 0 - 5 /HPF   Mucus PRESENT    Budding Yeast PRESENT     Comment: Performed at [facility] [address] [place], [place] [id number]  Basic metabolic panel     Status: Abnormal   Collection Time: 02/05/22 12:25 AM  Result Value Ref Range   Sodium 140 135 - 145 mmol/L   Potassium 3.8 3.5 - 5.1 mmol/L   Chloride 103 98 -  111 mmol/L   CO2 28 22 - 32 mmol/L   Glucose, Bld 171 (H) 70 - 99 mg/dL     Comment: Glucose reference range applies only to samples taken after fasting for at least 8 hours.   BUN 10 8 - 23 mg/dL   Creatinine, Ser 0.85 0.44 - 1.00 mg/dL   Calcium 8.9 8.9 - 10.3 mg/dL   GFR, Estimated >60 >60 mL/min    Comment: (NOTE) Calculated using the CKD-EPI Creatinine Equation (2021)    Anion gap 9 5 - 15    Comment: Performed at [facility] [address] [place], [place] [address]  Magnesium     Status: None   Collection Time: 02/05/22 12:25 AM  Result Value Ref Range   Magnesium 2.1 1.7 - 2.4 mg/dL    Comment: Performed at [facility], [place] [address], [place], [place] [address]  POC occult blood, ED     Status: Abnormal   Collection Time: 02/05/22  2:35 AM  Result Value Ref Range   Fecal Occult Bld POSITIVE (A) NEGATIVE  Basic metabolic panel     Status: Abnormal   Collection Time: 02/05/22  2:48 AM  Result Value Ref Range   Sodium 143 135 - 145 mmol/L   Potassium 3.4 (L) 3.5 - 5.1 mmol/L   Chloride 104 98 - 111 mmol/L   CO2 28 22 - 32 mmol/L   Glucose, Bld 159 (H) 70 - 99 mg/dL    Comment: Glucose reference range applies only to samples taken after fasting for at least 8 hours.   BUN 9 8 - 23 mg/dL   Creatinine, Ser 0.85 0.44 - 1.00 mg/dL   Calcium 9.0 8.9 - 10.3 mg/dL   GFR, Estimated >60 >60 mL/min    Comment: (NOTE) Calculated using the CKD-EPI Creatinine Equation (2021)    Anion gap 11 5 - 15    Comment: Performed at [facility] [address] [place], [place] [address]  CBC     Status: Abnormal   Collection Time: 02/05/22  2:48 AM  Result Value Ref Range   WBC 12.0 (H) 4.0 - 10.5 K/uL   RBC 4.17 3.87 - 5.11 MIL/uL   Hemoglobin 11.7 (L) 12.0 - 15.0 g/dL   HCT 35.3 (L) 36.0 - 46.0 %   MCV 84.7 80.0 - 100.0 fL   MCH 28.1 26.0 - 34.0 pg   MCHC 33.1 30.0 - 36.0 g/dL   RDW 15.0 11.5 - 15.5 %   Platelets 276 150 - 400 K/uL   nRBC 0.0 0.0 - 0.2 %    Comment: Performed at [facility], [place] [address] [place], [place]  [address]  CBG monitoring, ED     Status: Abnormal   Collection Time: 02/05/22  4:07 AM  Result Value Ref Range   Glucose-Capillary 130 (H) 70 - 99 mg/dL    Comment: Glucose reference range applies only to samples taken after fasting for at least 8 hours.  Type and screen [place]     Status: None (Preliminary result)   Collection Time: 02/05/22  4:15 AM  Result Value Ref Range   ABO/RH(D) PENDING    Antibody Screen PENDING    Sample Expiration      02/08/2022,2359 Performed at [facility], [place] [address], [place],  [address]   CBC     Status: Abnormal   Collection Time: 02/05/22  8:40 AM  Result Value Ref Range  WBC 10.0 4.0 - 10.5 K/uL   RBC 4.18 3.87 - 5.11 MIL/uL   Hemoglobin 11.6 (L) 12.0 - 15.0 g/dL   HCT 36.2 36.0 - 46.0 %   MCV 86.6 80.0 - 100.0 fL   MCH 27.8 26.0 - 34.0 pg   MCHC 32.0 30.0 - 36.0 g/dL   RDW 15.1 11.5 - 15.5 %   Platelets 268 150 - 400 K/uL   nRBC 0.0 0.0 - 0.2 %    Comment: Performed at [facility], [place] [address], [place], [place] [address]  CBG monitoring, ED     Status: Abnormal   Collection Time: 02/05/22  9:08 AM  Result Value Ref Range   Glucose-Capillary 146 (H) 70 - 99 mg/dL    Comment: Glucose reference range applies only to samples taken after fasting for at least 8 hours.  CBG monitoring, ED     Status: None   Collection Time: 02/05/22 12:09 PM  Result Value Ref Range   Glucose-Capillary 97 70 - 99 mg/dL    Comment: Glucose reference range applies only to samples taken after fasting for at least 8 hours.   CT ABDOMEN PELVIS W CONTRAST  Result Date: 02/04/2022 CLINICAL DATA:  Abdominal pain and hematochezia EXAM: CT ABDOMEN AND PELVIS WITH CONTRAST TECHNIQUE: Multidetector CT imaging of the abdomen and pelvis was performed using the standard protocol following bolus administration of intravenous contrast. RADIATION DOSE REDUCTION: This exam was performed according to the departmental dose-optimization  program which includes automated exposure control, adjustment of the mA and/or kV according to patient size and/or use of iterative reconstruction technique. CONTRAST:  43mL OMNIPAQUE IOHEXOL 350 MG/ML SOLN COMPARISON:  None Available. FINDINGS: Lower chest: Lung bases are free of acute infiltrate. Minimal scarring is noted bilaterally. Hepatobiliary: Gallbladder is well distended without evidence of cholelithiasis. Liver shows a few scattered hypodensities without significant change on delayed images consistent with small cysts. Biliary ductal system shows no obstructive change. Pancreas: Unremarkable. No pancreatic ductal dilatation or surrounding inflammatory changes. Spleen: Normal in size without focal abnormality. Adrenals/Urinary Tract: Adrenal glands are within normal limits. Right kidney demonstrates multiple cystic lesions. One of these is simple in appearance measuring up to 4.6 cm. The second measures 3.2 cm in demonstrates significant calcification along the wall. Left kidney also demonstrates cystic change which appears simple in nature. The largest of these measures 5.4 cm. Punctate nonobstructing renal stone is noted on the left in the lower pole. No obstructive changes are seen. The bladder is well distended. Stomach/Bowel: The appendix is well visualized. No obstructive or inflammatory changes of the colon are seen. Small bowel and stomach appear within normal limits. Vascular/Lymphatic: No significant vascular findings are present. No enlarged abdominal or pelvic lymph nodes. Reproductive: Uterus and bilateral adnexa are unremarkable. Other: No free fluid is noted. Small fat containing umbilical hernia is noted. Musculoskeletal: Degenerative changes of lumbar spine are seen. IMPRESSION: Cystic changes are noted within both kidneys. The predominance of these are simple in nature. Some calcifications are noted within 1 of the right renal cysts however. No significant enhancement is noted and the  cyst is otherwise simple in appearance. Short-term follow-up with ultrasound of the kidneys in 3-6 months is recommended to assess for stability. No findings to correspond with the given clinical history are noted. Punctate nonobstructing left renal stone. Electronically Signed   By: Inez Catalina M.D.   On: 02/04/2022 21:36    Pending Labs Unresulted Labs (From admission, onward)  Start     Ordered   02/06/22 0500  Basic metabolic panel  Daily,   R      02/05/22 0348   02/06/22 0500  Magnesium  Tomorrow morning,   R        02/05/22 0348   02/05/22 0900  CBC  Now then every 6 hours,   R (with TIMED occurrences)      02/05/22 0348            Vitals/Pain Today's Vitals   02/05/22 0950 02/05/22 1000 02/05/22 1100 02/05/22 1300  BP: 137/61 (!) 133/56 (!) 132/56 105/86  Pulse: 68 (!) 59 66 70  Resp: 20 20 13 14   Temp: 98.1 F (36.7 C)   97.9 F (36.6 C)  TempSrc: Oral   Oral  SpO2: 100% 93% 95% 96%  Weight:      Height:      PainSc:        Isolation Precautions No active isolations  Medications Medications  acetaminophen (TYLENOL) tablet 650 mg (has no administration in time range)  HYDROcodone-acetaminophen (NORCO/VICODIN) 5-325 MG per tablet 1 tablet (has no administration in time range)  atorvastatin (LIPITOR) tablet 10 mg (10 mg Oral Given 02/05/22 0935)  hydrOXYzine (ATARAX) tablet 10 mg (has no administration in time range)  QUEtiapine (SEROQUEL XR) 24 hr tablet 100 mg (has no administration in time range)  insulin aspart (novoLOG) injection 0-6 Units ( Subcutaneous Not Given 02/05/22 1211)  sodium chloride flush (NS) 0.9 % injection 3 mL (3 mLs Intravenous Given 02/05/22 0936)  pantoprazole (PROTONIX) injection 40 mg (40 mg Intravenous Given 02/05/22 0432)  prochlorperazine (COMPAZINE) injection 10 mg (has no administration in time range)  hydrALAZINE (APRESOLINE) injection 10 mg (has no administration in time range)  ondansetron (ZOFRAN) injection 4 mg (4 mg  Intravenous Given 02/04/22 1928)  sodium chloride 0.9 % bolus 500 mL (0 mLs Intravenous Stopped 02/04/22 2207)  potassium chloride 10 mEq in 100 mL IVPB (0 mEq Intravenous Stopped 02/05/22 0019)  magnesium sulfate IVPB 1 g 100 mL (0 g Intravenous Stopped 02/04/22 2316)  iohexol (OMNIPAQUE) 350 MG/ML injection 70 mL (70 mLs Intravenous Contrast Given 02/04/22 2128)  metoCLOPramide (REGLAN) injection 5 mg (5 mg Intravenous Given 02/04/22 2207)  potassium chloride SA (KLOR-CON M) CR tablet 40 mEq (40 mEq Oral Given 02/04/22 2332)  potassium chloride SA (KLOR-CON M) CR tablet 20 mEq (20 mEq Oral Given 02/05/22 0424)    Mobility walks     Focused Assessments     R Recommendations: See Admitting Provider Note  Report given to:   Additional Notes: Pt is alert and oriented and using a bedside commode. Pt is alert and oriented x4 and able to ambulate without assistance.

## 2022-02-05 NOTE — ED Notes (Signed)
Pt tolerating PO fluids well at this time 

## 2022-02-06 ENCOUNTER — Encounter (HOSPITAL_COMMUNITY)
Admission: EM | Disposition: A | Discharge status: Home / Self Care | Payer: Self-pay | Source: Home / Self Care | Attending: Emergency Medicine

## 2022-02-06 ENCOUNTER — Observation Stay (HOSPITAL_COMMUNITY): Payer: 59 | Admitting: Anesthesiology

## 2022-02-06 ENCOUNTER — Encounter (HOSPITAL_COMMUNITY): Payer: Self-pay | Admitting: Family Medicine

## 2022-02-06 ENCOUNTER — Observation Stay (HOSPITAL_BASED_OUTPATIENT_CLINIC_OR_DEPARTMENT_OTHER): Payer: 59 | Admitting: Anesthesiology

## 2022-02-06 DIAGNOSIS — K2970 Gastritis, unspecified, without bleeding: Secondary | ICD-10-CM | POA: Diagnosis not present

## 2022-02-06 DIAGNOSIS — K449 Diaphragmatic hernia without obstruction or gangrene: Secondary | ICD-10-CM

## 2022-02-06 DIAGNOSIS — E119 Type 2 diabetes mellitus without complications: Secondary | ICD-10-CM

## 2022-02-06 DIAGNOSIS — K2210 Ulcer of esophagus without bleeding: Secondary | ICD-10-CM

## 2022-02-06 DIAGNOSIS — K922 Gastrointestinal hemorrhage, unspecified: Secondary | ICD-10-CM | POA: Diagnosis not present

## 2022-02-06 DIAGNOSIS — K921 Melena: Secondary | ICD-10-CM | POA: Diagnosis not present

## 2022-02-06 HISTORY — PX: ESOPHAGOGASTRODUODENOSCOPY (EGD) WITH PROPOFOL: SHX5813

## 2022-02-06 HISTORY — PX: BIOPSY: SHX5522

## 2022-02-06 LAB — CBC
HCT: 34.7 % — ABNORMAL LOW (ref 36.0–46.0)
HCT: 36.9 % (ref 36.0–46.0)
Hemoglobin: 11.4 g/dL — ABNORMAL LOW (ref 12.0–15.0)
Hemoglobin: 12.1 g/dL (ref 12.0–15.0)
MCH: 28.1 pg (ref 26.0–34.0)
MCH: 27.9 pg (ref 26.0–34.0)
MCHC: 32.9 g/dL (ref 30.0–36.0)
MCHC: 32.8 g/dL (ref 30.0–36.0)
MCV: 85.5 fL (ref 80.0–100.0)
MCV: 85 fL (ref 80.0–100.0)
Platelets: 253 10*3/uL (ref 150–400)
Platelets: 252 10*3/uL (ref 150–400)
RBC: 4.06 MIL/uL (ref 3.87–5.11)
RBC: 4.34 MIL/uL (ref 3.87–5.11)
RDW: 15.3 % (ref 11.5–15.5)
RDW: 15.4 % (ref 11.5–15.5)
WBC: 8.2 10*3/uL (ref 4.0–10.5)
WBC: 7.1 10*3/uL (ref 4.0–10.5)
nRBC: 0 % (ref 0.0–0.2)
nRBC: 0 % (ref 0.0–0.2)

## 2022-02-06 LAB — BASIC METABOLIC PANEL
Anion gap: 10 (ref 5–15)
BUN: 10 mg/dL (ref 8–23)
CO2: 27 mmol/L (ref 22–32)
Calcium: 9.3 mg/dL (ref 8.9–10.3)
Chloride: 105 mmol/L (ref 98–111)
Creatinine, Ser: 0.91 mg/dL (ref 0.44–1.00)
GFR, Estimated: >60 mL/min (ref >60)
Glucose, Bld: 128 mg/dL — ABNORMAL HIGH (ref 70–99)
Potassium: 3.3 mmol/L — ABNORMAL LOW (ref 3.5–5.1)
Sodium: 142 mmol/L (ref 135–145)

## 2022-02-06 LAB — GLUCOSE, CAPILLARY
Glucose-Capillary: 113 mg/dL — ABNORMAL HIGH (ref 70–99)
Glucose-Capillary: 113 mg/dL — ABNORMAL HIGH (ref 70–99)
Glucose-Capillary: 129 mg/dL — ABNORMAL HIGH (ref 70–99)
Glucose-Capillary: 116 mg/dL — ABNORMAL HIGH (ref 70–99)
Glucose-Capillary: 176 mg/dL — ABNORMAL HIGH (ref 70–99)

## 2022-02-06 LAB — MAGNESIUM: Magnesium: 2 mg/dL (ref 1.7–2.4)

## 2022-02-06 SURGERY — ESOPHAGOGASTRODUODENOSCOPY (EGD) WITH PROPOFOL
Anesthesia: Monitor Anesthesia Care

## 2022-02-06 MED ORDER — POTASSIUM CHLORIDE CRYS ER 20 MEQ PO TBCR
40.0000 meq | EXTENDED_RELEASE_TABLET | ORAL | Status: AC
  Administered 2022-02-06 (×2): 40 meq via ORAL
  Filled 2022-02-06 (×2): qty 2

## 2022-02-06 MED ORDER — PROPOFOL 10 MG/ML IV BOLUS
INTRAVENOUS | Status: DC | PRN
  Administered 2022-02-06 (×2): 20 mg via INTRAVENOUS
  Administered 2022-02-06: 10 mg via INTRAVENOUS
  Administered 2022-02-06: 20 mg via INTRAVENOUS
  Administered 2022-02-06: 10 mg via INTRAVENOUS

## 2022-02-06 MED ORDER — PROPOFOL 500 MG/50ML IV EMUL
INTRAVENOUS | Status: DC | PRN
  Administered 2022-02-06: 125 ug/kg/min via INTRAVENOUS

## 2022-02-06 MED ORDER — LACTATED RINGERS IV SOLN: INTRAVENOUS | Status: DC

## 2022-02-06 SURGICAL SUPPLY — 15 items

## 2022-02-06 NOTE — Plan of Care (Signed)
  Problem: Coping: Goal: Ability to adjust to condition or change in health will improve Outcome: Progressing   Problem: Education: Goal: Knowledge of General Education information will improve Description: Including pain rating scale, medication(s)/side effects and non-pharmacologic comfort measures Outcome: Progressing   

## 2022-02-06 NOTE — Op Note (Signed)
[facility] Patient Name: Casey [name] Procedure Date : 02/06/2022 MRN: [id number] Attending MD: Kathi Der , MD, [id number] Date of Birth: 1935/04/08 CSN: [id number] Age: 87 Admit Type: Inpatient Procedure:                Upper GI endoscopy Indications:              [place] Providers:                Kathi Der, MD, Fransisca Connors, Salley Scarlet, Technician, Glo Herring, CRNA Referring MD:              Medicines:                Sedation Administered by an Anesthesia Professional Complications:            No immediate complications. Estimated Blood Loss:     Estimated blood loss was minimal. Procedure:                Pre-Anesthesia Assessment:                           - Prior to the procedure, a History and Physical                            was performed, and patient medications and                            allergies were reviewed. The patient's tolerance of                            previous anesthesia was also reviewed. The risks                            and benefits of the procedure and the sedation                            options and risks were discussed with the patient.                            All questions were answered, and informed consent                            was obtained. Prior Anticoagulants: The patient has                            taken no anticoagulant or antiplatelet agents                            except for aspirin. ASA Grade Assessment: III - A                            patient with severe systemic disease. After  reviewing the risks and benefits, the patient was                            deemed in satisfactory condition to undergo the                            procedure.                           After obtaining informed consent, the endoscope was                            passed under direct vision. Throughout the                            procedure, the  patient's blood pressure, pulse, and                            oxygen saturations were monitored continuously. The                            [id number]-[id number] ([id number]) Olympus endoscope was introduced                            through the mouth, and advanced to the second part                            of duodenum. The upper GI endoscopy was                            accomplished without difficulty. The patient                            tolerated the procedure well. Scope In: Scope Out: Findings:      One superficial esophageal ulcer with no bleeding and no stigmata of       recent bleeding was found at the gastroesophageal junction. The lesion       was 10 mm in largest dimension.      A medium-sized hiatal hernia was present.      Diffuse moderate inflammation characterized by congestion (edema),       erosions and friability was found in the entire examined stomach.       Biopsies were taken with a cold forceps for histology.      The cardia and gastric fundus were normal on retroflexion.      The duodenal bulb, first portion of the duodenum and second portion of       the duodenum were normal except for mild narrowing of the deuodenal       sweep. Impression:               - Esophageal ulcer with no bleeding and no stigmata                            of recent bleeding.                           -  Medium-sized hiatal hernia.                           - Gastritis. Biopsied.                           - Normal duodenal bulb, first portion of the                            duodenum and second portion of the duodenum. Recommendation:           - Return patient to hospital ward for ongoing care.                           - Resume previous diet.                           - Continue present medications.                           - Await pathology results.                           - Repeat upper endoscopy in 3 months to check                            healing.                           -  Return to GI office in 2 months. Procedure Code(s):        --- Professional ---                           [phone], Esophagogastroduodenoscopy, flexible,                            transoral; with biopsy, single or multiple Diagnosis Code(s):        --- Professional ---                           K22.10, Ulcer of esophagus without bleeding                           K44.9, Diaphragmatic hernia without obstruction or                            gangrene                           K29.70, Gastritis, unspecified, without bleeding                           K92.1, Melena (includes Hematochezia) CPT copyright 2022 [employer]. All rights reserved. The codes documented in this report are preliminary and upon coder review may  be revised to meet current compliance requirements. Otis Brace, MD Otis Brace, MD 02/06/2022 3:26:47 PM Number of Addenda: 0

## 2022-02-06 NOTE — Progress Notes (Signed)
PROGRESS NOTE    Casey [name]  WUJ:[id number] DOB: 08-26-1935 DOA: 02/04/2022 PCP: Ardith Dark, MD    Brief Narrative:   Casey [name] is a 87 y.o. female with past medical history significant for HTN, DM2, dementia, anxiety, chronic pain with NSAID who presented to [facility] ED on 1/17 from home via EMS with nausea/vomiting and dark tarry stools.  Patient states was in her usual state of health until day prior to admission.  Reports stool was very dark but denies any blood in her vomitus.  Reports some additional mild periumbilical pain.  Patient reports she has chronic pain managed with Norco but also reports taking ibuprofen multiple times daily for her headaches.  Denies chest pain or lightheadedness.  Has been followed by [facility], Dr. Madilyn Fireman in the past.  In the ED, 97.7 F, HR 74, RR 27, BP 139/55, SpO2 98% on room air.  Sodium 141, potassium 2.4, chloride 99, CO2 28, glucose 263, BUN 13, creatinine 0.96.  Lipase 36.  AST 30, ALT 21, total bilirubin 0.5.  WBC 19.0, hemoglobin 13.2, platelets 295.  COVID-19/influenza A/B/RSV PCR negative.  Urinalysis unrevealing.  FOBT positive.  CT abdomen/pelvis with cystic changes noted bilateral kidneys, no significant enhancement noted, punctate nonobstructing left renal stone.  Patient was given oral/IV potassium, IV magnesium, Reglan, Zofran and acetaminophen in the ED.  TRH consulted for admission and further evaluation/management of acute upper GI bleed.  Assessment & Plan:   Upper GI bleed Patient presenting to ED with 1 day history of nausea/vomiting, dark tarry stools in the setting of NSAID abuse with ibuprofen.  Hemoglobin 13.2 on admission, FOBT positive.  CT abdomen/pelvis unrevealing. -- [facility] following, appreciate assistance -- Hgb 13.2>11.7>11.6>11.5>11.8>11.4 -- H/H q6h -- Protonix 40 mg IV every 12 hours -- Holding home aspirin -- N.p.o. pending EGD this afternoon -- Transfuse for hemoglobin less than 7.0 or active  bleeding  Hypokalemia Potassium 2.4 on admission.  Repleted.  Potassium 3.3 this morning, will continue repletion --Repeat BMP, magnesium level in a.m.  Leukocytosis: Resolved WBC count 19.0 on admission.  No infectious etiology elucidated.  Suspect back to reactive from active GI bleed.  Repeat WBC count now normalized 11.0.  Bilateral renal cysts CT abdomen/pelvis notable for cystic changes bilateral kidneys.  Recommend follow-up ultrasound in 3-6 months outpatient.  Essential hypertension On lisinopril 40 mg p.o. daily, amlodipine 10 mg p.o. daily, HCTZ 12.5 mg p.o. daily at home. -- Holding home and hypertensives in the setting of active GI bleed, current BP 146/61, stable -- Hydralazine 10mg  IV q6h PRN SBP >170 -- Continue monitor BP closely  Type 2 diabetes mellitus Hemoglobin A1c 7.3 on 12/26/2021.  Home regimen includes metformin 500 mg daily, Jardiance 10 mg p.o. daily. -- Hold oral hypoglycemics while inpatient -- SSI for coverage -- CBGs q4h while NPO  Hyperlipidemia: -- Atorvastatin 10 mg p.o. daily  Chronic pain Opioid dependence --Tylenol as needed mild pain -- Norco every 8 hours as needed moderate pain -- Avoidance of NSAIDs  QTc prolongation --Holding home Lexapro, Aricept due to prolonged QTc --Avoid QTc prolonging medications  Anxiety Holding home Lexapro due to QTc prolongation  Dementia --Delirium precautions --Get up during the day --Encourage a familiar face to remain present throughout the day --Keep blinds open and lights on during daylight hours --Minimize the use of opioids/benzodiazepines --Seroquel 100 mg p.o. nightly   DVT prophylaxis: SCDs Start: 02/05/22 0347    Code Status: Full Code Family Communication: No family present  at bedside this morning  Disposition Plan:  Level of care: Med-Surg Status is: Observation The patient remains OBS appropriate and will d/c before 2 midnights.    Consultants:  [facility] gastroenterology, Dr.  Alessandra Bevels  Procedures:  None  Antimicrobials:  None   Subjective: Patient seen examined bedside, resting calmly.  Lying in bed.  Awaiting for EGD planned for this afternoon.  Denies any bowel movements since arriving to the floor.  No specific questions or concerns at this time.  Denies headache, no dizziness, no chest pain, palpitations, no shortness of breath, no abdominal pain, no fever/chills/night sweats, no current nausea/vomiting, no diarrhea, no cough/congestion, no paresthesias.  No acute events overnight per nursing staff.  Objective: Vitals:   02/05/22 1941 02/05/22 2306 02/06/22 0333 02/06/22 0500  BP: (!) 161/62 130/62 (!) 144/61   Pulse: 70 (!) 56 61   Resp: 18 18 18    Temp: 98 F (36.7 C) (!) 97.5 F (36.4 C) 97.8 F (36.6 C)   TempSrc: Oral Oral Oral   SpO2: 100% 100% 100%   Weight:    73.8 kg  Height:        Intake/Output Summary (Last 24 hours) at 02/06/2022 1257 Last data filed at 02/05/2022 2230 Gross per 24 hour  Intake 240 ml  Output --  Net 240 ml   Filed Weights   02/04/22 1900 02/06/22 0500  Weight: 66.7 kg 73.8 kg    Examination:  Physical Exam: GEN: NAD, alert and oriented x 3, elderly in appearance HEENT: NCAT, PERRL, EOMI, sclera clear, MMM PULM: CTAB w/o wheezes/crackles, normal respiratory effort, on room air CV: RRR w/o M/G/R GI: abd soft, NTND, NABS, no R/G/M MSK: no peripheral edema, moves all extremities independently NEURO: CN II-XII intact, no focal deficits, sensation to light touch intact PSYCH: normal mood/affect Integumentary: dry/intact, no rashes or wounds    Data Reviewed: I have personally reviewed following labs and imaging studies  CBC: Recent Labs  Lab 02/04/22 1926 02/05/22 0248 02/05/22 0840 02/05/22 1627 02/05/22 2045 02/06/22 0323 02/06/22 1003  WBC 19.0*   < > 10.0 9.4 9.2 8.2 7.1  NEUTROABS 15.7*  --   --   --   --   --   --   HGB 13.2   < > 11.6* 11.5* 11.8* 11.4* 12.1  HCT 40.3   < > 36.2  35.5* 35.9* 34.7* 36.9  MCV 84.8   < > 86.6 85.3 84.9 85.5 85.0  PLT 295   < > 268 281 259 253 252   < > = values in this interval not displayed.   Basic Metabolic Panel: Recent Labs  Lab 02/04/22 1926 02/05/22 0025 02/05/22 0248 02/06/22 0323  NA 141 140 143 142  K 2.4* 3.8 3.4* 3.3*  CL 99 103 104 105  CO2 28 28 28 27   GLUCOSE 263* 171* 159* 128*  BUN 13 10 9 10   CREATININE 0.96 0.85 0.85 0.91  CALCIUM 9.7 8.9 9.0 9.3  MG  --  2.1  --  2.0   GFR: Estimated Creatinine Clearance: 43.3 mL/min (by C-G formula based on SCr of 0.91 mg/dL). Liver Function Tests: Recent Labs  Lab 02/04/22 1926  AST 30  ALT 21  ALKPHOS 84  BILITOT 0.5  PROT 7.7  ALBUMIN 4.4   Recent Labs  Lab 02/04/22 1926  LIPASE 36   No results for input(s): "AMMONIA" in the last 168 hours. Coagulation Profile: No results for input(s): "INR", "PROTIME" in the last 168 hours. Cardiac  Enzymes: No results for input(s): "CKTOTAL", "CKMB", "CKMBINDEX", "TROPONINI" in the last 168 hours. BNP (last 3 results) No results for input(s): "PROBNP" in the last 8760 hours. HbA1C: No results for input(s): "HGBA1C" in the last 72 hours. CBG: Recent Labs  Lab 02/05/22 1949 02/05/22 2305 02/06/22 0334 02/06/22 0820 02/06/22 1134  GLUCAP 175* 124* 113* 113* 129*   Lipid Profile: No results for input(s): "CHOL", "HDL", "LDLCALC", "TRIG", "CHOLHDL", "LDLDIRECT" in the last 72 hours. Thyroid Function Tests: No results for input(s): "TSH", "T4TOTAL", "FREET4", "T3FREE", "THYROIDAB" in the last 72 hours. Anemia Panel: No results for input(s): "VITAMINB12", "FOLATE", "FERRITIN", "TIBC", "IRON", "RETICCTPCT" in the last 72 hours. Sepsis Labs: No results for input(s): "PROCALCITON", "LATICACIDVEN" in the last 168 hours.  Recent Results (from the past 240 hour(s))  Resp panel by RT-PCR (RSV, Flu A&B, Covid) Anterior Nasal Swab     Status: None   Collection Time: 02/04/22  7:32 PM   Specimen: Anterior Nasal Swab   Result Value Ref Range Status   SARS Coronavirus 2 by RT PCR NEGATIVE NEGATIVE Final    Comment: (NOTE) SARS-CoV-2 target nucleic acids are NOT DETECTED.  The SARS-CoV-2 RNA is generally detectable in upper respiratory specimens during the acute phase of infection. The lowest concentration of SARS-CoV-2 viral copies this assay can detect is 138 copies/mL. A negative result does not preclude SARS-Cov-2 infection and should not be used as the sole basis for treatment or other patient management decisions. A negative result may occur with  improper specimen collection/handling, submission of specimen other than nasopharyngeal swab, presence of viral mutation(s) within the areas targeted by this assay, and inadequate number of viral copies(<138 copies/mL). A negative result must be combined with clinical observations, patient history, and epidemiological information. The expected result is Negative.  Fact Sheet for Patients:  EntrepreneurPulse.com.au  Fact Sheet for Healthcare Providers:  IncredibleEmployment.be  This test is no t yet approved or cleared by the [place] FDA and  has been authorized for detection and/or diagnosis of SARS-CoV-2 by FDA under an Emergency Use Authorization (EUA). This EUA will remain  in effect (meaning this test can be used) for the duration of the COVID-19 declaration under Section 564(b)(1) of the Act, 21 U.S.C.section 360bbb-3(b)(1), unless the authorization is terminated  or revoked sooner.       Influenza A by PCR NEGATIVE NEGATIVE Final   Influenza B by PCR NEGATIVE NEGATIVE Final    Comment: (NOTE) The Xpert Xpress SARS-CoV-2/FLU/RSV plus assay is intended as an aid in the diagnosis of influenza from Nasopharyngeal swab specimens and should not be used as a sole basis for treatment. Nasal washings and aspirates are unacceptable for Xpert Xpress SARS-CoV-2/FLU/RSV testing.  Fact Sheet for  Patients: EntrepreneurPulse.com.au  Fact Sheet for Healthcare Providers: IncredibleEmployment.be  This test is not yet approved or cleared by the [place] FDA and has been authorized for detection and/or diagnosis of SARS-CoV-2 by FDA under an Emergency Use Authorization (EUA). This EUA will remain in effect (meaning this test can be used) for the duration of the COVID-19 declaration under Section 564(b)(1) of the Act, 21 U.S.C. section 360bbb-3(b)(1), unless the authorization is terminated or revoked.     Resp Syncytial Virus by PCR NEGATIVE NEGATIVE Final    Comment: (NOTE) Fact Sheet for Patients: EntrepreneurPulse.com.au  Fact Sheet for Healthcare Providers: IncredibleEmployment.be  This test is not yet approved or cleared by the [place] FDA and has been authorized for detection and/or diagnosis of SARS-CoV-2 by FDA under  an Emergency Use Authorization (EUA). This EUA will remain in effect (meaning this test can be used) for the duration of the COVID-19 declaration under Section 564(b)(1) of the Act, 21 U.S.C. section 360bbb-3(b)(1), unless the authorization is terminated or revoked.  Performed at [facility], [address] [place], [place] [address]          Radiology Studies: CT ABDOMEN PELVIS W CONTRAST  Result Date: 02/04/2022 CLINICAL DATA:  Abdominal pain and hematochezia EXAM: CT ABDOMEN AND PELVIS WITH CONTRAST TECHNIQUE: Multidetector CT imaging of the abdomen and pelvis was performed using the standard protocol following bolus administration of intravenous contrast. RADIATION DOSE REDUCTION: This exam was performed according to the departmental dose-optimization program which includes automated exposure control, adjustment of the mA and/or kV according to patient size and/or use of iterative reconstruction technique. CONTRAST:  1mL OMNIPAQUE IOHEXOL 350 MG/ML SOLN  COMPARISON:  None Available. FINDINGS: Lower chest: Lung bases are free of acute infiltrate. Minimal scarring is noted bilaterally. Hepatobiliary: Gallbladder is well distended without evidence of cholelithiasis. Liver shows a few scattered hypodensities without significant change on delayed images consistent with small cysts. Biliary ductal system shows no obstructive change. Pancreas: Unremarkable. No pancreatic ductal dilatation or surrounding inflammatory changes. Spleen: Normal in size without focal abnormality. Adrenals/Urinary Tract: Adrenal glands are within normal limits. Right kidney demonstrates multiple cystic lesions. One of these is simple in appearance measuring up to 4.6 cm. The second measures 3.2 cm in demonstrates significant calcification along the wall. Left kidney also demonstrates cystic change which appears simple in nature. The largest of these measures 5.4 cm. Punctate nonobstructing renal stone is noted on the left in the lower pole. No obstructive changes are seen. The bladder is well distended. Stomach/Bowel: The appendix is well visualized. No obstructive or inflammatory changes of the colon are seen. Small bowel and stomach appear within normal limits. Vascular/Lymphatic: No significant vascular findings are present. No enlarged abdominal or pelvic lymph nodes. Reproductive: Uterus and bilateral adnexa are unremarkable. Other: No free fluid is noted. Small fat containing umbilical hernia is noted. Musculoskeletal: Degenerative changes of lumbar spine are seen. IMPRESSION: Cystic changes are noted within both kidneys. The predominance of these are simple in nature. Some calcifications are noted within 1 of the right renal cysts however. No significant enhancement is noted and the cyst is otherwise simple in appearance. Short-term follow-up with ultrasound of the kidneys in 3-6 months is recommended to assess for stability. No findings to correspond with the given clinical history are  noted. Punctate nonobstructing left renal stone. Electronically Signed   By: Alcide Clever M.D.   On: 02/04/2022 21:36        Scheduled Meds:  atorvastatin  10 mg Oral Daily   insulin aspart  0-6 Units Subcutaneous Q4H   pantoprazole (PROTONIX) IV  40 mg Intravenous Q12H   potassium chloride  40 mEq Oral Q3H   QUEtiapine  100 mg Oral QHS   sodium chloride flush  3 mL Intravenous Q12H   Continuous Infusions:  sodium chloride Stopped (02/06/22 1002)     LOS: 0 days    Time spent: 52 minutes spent on chart review, discussion with nursing staff, consultants, updating family and interview/physical exam; more than 50% of that time was spent in counseling and/or coordination of care.    Alvira Philips [place], DO [facility] Available via Epic secure chat 7am-7pm After these hours, please refer to coverage provider listed on [email or web address] 02/06/2022, 12:57 PM

## 2022-02-06 NOTE — Anesthesia Postprocedure Evaluation (Signed)
Anesthesia Post Note  Patient: Casey [name]  Procedure(s) Performed: ESOPHAGOGASTRODUODENOSCOPY (EGD) WITH PROPOFOL BIOPSY     Patient location during evaluation: PACU Anesthesia Type: MAC Level of consciousness: awake and alert Pain management: pain level controlled Vital Signs Assessment: post-procedure vital signs reviewed and stable Respiratory status: spontaneous breathing, nonlabored ventilation, respiratory function stable and patient connected to nasal cannula oxygen Cardiovascular status: stable and blood pressure returned to baseline Postop Assessment: no apparent nausea or vomiting Anesthetic complications: no  No notable events documented.  Last Vitals:  Vitals:   02/06/22 1530 02/06/22 1540  BP: (!) 163/77 (!) 161/85  Pulse: 64 64  Resp: 18 18  Temp:    SpO2: 97% 98%    Last Pain:  Vitals:   02/06/22 1530  TempSrc:   PainSc: 0-No pain                 Alyrica Thurow S

## 2022-02-06 NOTE — Transfer of Care (Signed)
Immediate Anesthesia Transfer of Care Note  Patient: Casey [name]  Procedure(s) Performed: ESOPHAGOGASTRODUODENOSCOPY (EGD) WITH PROPOFOL BIOPSY  Patient Location: Endoscopy Unit  Anesthesia Type:MAC  Level of Consciousness: awake and alert   Airway & Oxygen Therapy: Patient Spontanous Breathing and Patient connected to nasal cannula oxygen  Post-op Assessment: Report given to RN and Post -op Vital signs reviewed and stable  Post vital signs: Reviewed and stable  Last Vitals:  Vitals Value Taken Time  BP 134/101   Temp    Pulse 86   Resp 16   SpO2 96     Last Pain:  Vitals:   02/06/22 1358  TempSrc:   PainSc: 3       Patients Stated Pain Goal: 0 (42/70/62 3762)  Complications: No notable events documented.

## 2022-02-06 NOTE — Brief Op Note (Signed)
02/04/2022 - 02/06/2022  3:23 PM  PATIENT:  Casey [name]  87 y.o. female  PRE-OPERATIVE DIAGNOSIS:  Melena  POST-OPERATIVE DIAGNOSIS:  GE junction ulcer; Gastritis  PROCEDURE:  Procedure(s): ESOPHAGOGASTRODUODENOSCOPY (EGD) WITH PROPOFOL (N/A) BIOPSY  SURGEON:  Surgeon(s) and Role:    * Avyaan Summer, MD - Primary  Findings ----------- -EGD showed clean-based GE junction ulcer without any evidence of active bleeding.  Medium size hiatal hernia and gastritis.  Biopsies from gastritis performed.  Recommendations -------------------------- -Continue IV twice daily PPI for today. -Change PPI to Protonix 40 mg twice daily on discharge.  Continue Protonix 40 mg twice a day for 4 weeks followed by Protonix 40 mg once a day -Hold aspirin for 5 days. -Will need repeat EGD to document healing of ulcer in 3 months. -Follow-up in GI clinic in 2 months. -ok to  discharge tomorrow from GI standpoint if hemoglobin stable.  GI will sign off.  Call us back if needed.  Otis Brace MD, [place] 02/06/2022, 3:25 PM  Contact #  [phone]

## 2022-02-06 NOTE — Anesthesia Preprocedure Evaluation (Addendum)
Anesthesia Evaluation  Patient identified by MRN, date of birth, ID band Patient awake    Reviewed: Allergy & Precautions, NPO status , Patient's Chart, lab work & pertinent test results  Airway Mallampati: I  TM Distance: >3 FB Neck ROM: Full    Dental  (+) Missing   Pulmonary neg pulmonary ROS   Pulmonary exam normal        Cardiovascular hypertension, Pt. on medications Normal cardiovascular exam     Neuro/Psych  Headaches PSYCHIATRIC DISORDERS Anxiety Depression   Dementia  Neuromuscular disease    GI/Hepatic Neg liver ROS,GERD  Medicated and Controlled,,  Endo/Other  diabetes    Renal/GU Renal diseaseCr: 0.91     Musculoskeletal  (+) Arthritis ,    Abdominal   Peds  Hematology negative hematology ROS (+)   Anesthesia Other Findings Melena  Reproductive/Obstetrics                              Anesthesia Physical Anesthesia Plan  ASA: 3  Anesthesia Plan: MAC   Post-op Pain Management:    Induction: Intravenous  PONV Risk Score and Plan: 2 and Propofol infusion and Treatment may vary due to age or medical condition  Airway Management Planned: Nasal Cannula  Additional Equipment:   Intra-op Plan:   Post-operative Plan:   Informed Consent: I have reviewed the patients History and Physical, chart, labs and discussed the procedure including the risks, benefits and alternatives for the proposed anesthesia with the patient or authorized representative who has indicated his/her understanding and acceptance.     Dental advisory given  Plan Discussed with: CRNA  Anesthesia Plan Comments:         Anesthesia Quick Evaluation

## 2022-02-06 NOTE — Progress Notes (Signed)
Patient alert and oriented,no c/o of pain, orders released,patient consent signed and NPO from midnight, call light in reach and bed in low position,will continue to monitor.

## 2022-02-06 NOTE — Discharge Instructions (Addendum)
Hold aspirin for 5 days following discharge and then may resume.  Will need to continue Protonix 40 mg p.o. twice daily for 4 weeks followed by 40 mg once daily.  Outpatient follow-up with PCP in 1 week with recommended repeat CBC.  Outpatient follow-up with GI in 2 months as you will need repeat endoscopy.

## 2022-02-06 NOTE — Interval H&P Note (Signed)
History and Physical Interval Note:  02/06/2022 2:44 PM  Casey [name]  has presented today for surgery, with the diagnosis of Melena.  The various methods of treatment have been discussed with the patient and family. After consideration of risks, benefits and other options for treatment, the patient has consented to  Procedure(s): ESOPHAGOGASTRODUODENOSCOPY (EGD) WITH PROPOFOL (N/A) as a surgical intervention.  The patient's history has been reviewed, patient examined, no change in status, stable for surgery.  I have reviewed the patient's chart and labs.  Questions were answered to the patient's satisfaction.     Clifton Kovacic

## 2022-02-07 DIAGNOSIS — K921 Melena: Secondary | ICD-10-CM | POA: Diagnosis not present

## 2022-02-07 DIAGNOSIS — K253 Acute gastric ulcer without hemorrhage or perforation: Secondary | ICD-10-CM | POA: Diagnosis not present

## 2022-02-07 DIAGNOSIS — K2900 Acute gastritis without bleeding: Secondary | ICD-10-CM | POA: Diagnosis not present

## 2022-02-07 LAB — BASIC METABOLIC PANEL
Anion gap: 8 (ref 5–15)
BUN: 15 mg/dL (ref 8–23)
CO2: 25 mmol/L (ref 22–32)
Calcium: 9.3 mg/dL (ref 8.9–10.3)
Chloride: 109 mmol/L (ref 98–111)
Creatinine, Ser: 0.84 mg/dL (ref 0.44–1.00)
GFR, Estimated: >60 mL/min (ref >60)
Glucose, Bld: 107 mg/dL — ABNORMAL HIGH (ref 70–99)
Potassium: 4.1 mmol/L (ref 3.5–5.1)
Sodium: 142 mmol/L (ref 135–145)

## 2022-02-07 LAB — CBC
HCT: 33.1 % — ABNORMAL LOW (ref 36.0–46.0)
Hemoglobin: 11 g/dL — ABNORMAL LOW (ref 12.0–15.0)
MCH: 28.2 pg (ref 26.0–34.0)
MCHC: 33.2 g/dL (ref 30.0–36.0)
MCV: 84.9 fL (ref 80.0–100.0)
Platelets: 242 10*3/uL (ref 150–400)
RBC: 3.9 MIL/uL (ref 3.87–5.11)
RDW: 15.4 % (ref 11.5–15.5)
WBC: 8.4 10*3/uL (ref 4.0–10.5)
nRBC: 0 % (ref 0.0–0.2)

## 2022-02-07 LAB — GLUCOSE, CAPILLARY
Glucose-Capillary: 133 mg/dL — ABNORMAL HIGH (ref 70–99)
Glucose-Capillary: 146 mg/dL — ABNORMAL HIGH (ref 70–99)
Glucose-Capillary: 90 mg/dL (ref 70–99)

## 2022-02-07 LAB — MAGNESIUM: Magnesium: 1.9 mg/dL (ref 1.7–2.4)

## 2022-02-07 MED ORDER — ASPIRIN 81 MG PO CHEW: 81.0000 mg | CHEWABLE_TABLET | Freq: Two times a day (BID) | ORAL | Status: DC

## 2022-02-07 MED ORDER — POLYETHYLENE GLYCOL 3350 17 G PO PACK
17.0000 g | PACK | Freq: Once | ORAL | Status: AC
  Administered 2022-02-07: 17 g via ORAL
  Filled 2022-02-07: qty 1

## 2022-02-07 MED ORDER — PANTOPRAZOLE SODIUM 40 MG PO TBEC: DELAYED_RELEASE_TABLET | ORAL | 0 refills | Status: DC

## 2022-02-07 NOTE — Progress Notes (Signed)
  Transition of Care [facility]) Screening Note   Patient Details  Name: Casey [name] Date of Birth: 21-Jun-1935   Transition of Care [facility]) CM/SW Contact:    Bartholomew Crews, RN Phone Number: [phone] 02/07/2022, 8:09 AM  S/p EGD with biopsy - GI signed off, plan for outpt GI f/u  Transition of Care Department [facility]) has reviewed patient and no TOC needs have been identified at this time. We will continue to monitor patient advancement through interdisciplinary progression rounds. If new patient transition needs arise, please place a TOC consult.

## 2022-02-07 NOTE — Discharge Summary (Signed)
Physician Discharge Summary  Casey [name] DJM:[id number] DOB: [birth year] DOA: 02/04/2022  PCP: Vivi Barrack, MD  Admit date: 02/04/2022 Discharge date: 02/07/2022  Admitted From: Home Disposition: Home  Recommendations for Outpatient Follow-up:  Follow up with PCP in 1-2 weeks Follow-up with gastroenterology, Dr. Alessandra Bevels 2 months with planned repeat EGD in 3 months Continue Protonix 40 mg p.o. twice daily x 4 weeks followed by once daily Continue to encourage abstinence from NSAIDs as likely etiology to her gastroesophageal junction ulceration Continue to hold aspirin for 5 days following hospitalization then may resume per GI Please obtain CBC in one week for reassessment of hemoglobin Follow-up gastric biopsy that was pending at time of discharge Recommend outpatient ultrasound 3-6 months for follow-up regarding renal cysts  Home Health: No Equipment/Devices: None  Discharge Condition: Stable CODE STATUS: Full code Diet recommendation: Heart healthy/consistent carbohydrate diet  History of present illness:  Casey [name] is a 87 y.o. female with past medical history significant for HTN, DM2, dementia, anxiety, chronic pain with NSAID who presented to [facility] ED on 1/17 from home via EMS with nausea/vomiting and dark tarry stools.  Patient states was in her usual state of health until day prior to admission.  Reports stool was very dark but denies any blood in her vomitus.  Reports some additional mild periumbilical pain.  Patient reports she has chronic pain managed with Norco but also reports taking ibuprofen multiple times daily for her headaches.  Denies chest pain or lightheadedness.  Has been followed by [facility] GI, Dr. Amedeo Plenty in the past.   In the ED, 97.7 F, HR 74, RR 27, BP 139/55, SpO2 98% on room air.  Sodium 141, potassium 2.4, chloride 99, CO2 28, glucose 263, BUN 13, creatinine 0.96.  Lipase 36.  AST 30, ALT 21, total bilirubin 0.5.  WBC 19.0, hemoglobin 13.2, platelets  295.  COVID-19/influenza A/B/RSV PCR negative.  Urinalysis unrevealing.  FOBT positive.  CT abdomen/pelvis with cystic changes noted bilateral kidneys, no significant enhancement noted, punctate nonobstructing left renal stone.  Patient was given oral/IV potassium, IV magnesium, Reglan, Zofran and acetaminophen in the ED.  TRH consulted for admission and further evaluation/management of acute upper GI bleed.  Hospital course:  Upper GI bleed 2/2 GE junction ulceration and gastritis Patient presenting to ED with 1 day history of nausea/vomiting, dark tarry stools in the setting of NSAID abuse with ibuprofen.  Hemoglobin 13.2 on admission, FOBT positive. CT abdomen/pelvis unrevealing.  [facility] was consulted and followed during hospital course.  Patient was started on IV Protonix and home aspirin was held.  Patient underwent EGD on 02/06/2022 by Dr. Alessandra Bevels with findings of clean-based GE junction ulcer without evidence of active bleeding, medium size hiatal hernia and gastritis s/p biopsy.  Patient's hemoglobin remained stable during hospitalization and was 11.0 at time of discharge.  GI recommends holding aspirin for 5 days posthospitalization then may resume.  Continue Protonix 40 mg p.o. twice daily x 4 weeks followed by once daily.  Continue to encourage abstinence of NSAIDs.   Hypokalemia Repleted during hospitalization   Leukocytosis: Resolved WBC count 19.0 on admission.  No infectious etiology elucidated.  Suspect back to reactive from active GI bleed.  Repeat WBC count now normalized 8.4 at time of discharge.   Bilateral renal cysts CT abdomen/pelvis notable for cystic changes bilateral kidneys.  Recommend follow-up ultrasound in 3-6 months outpatient.   Essential hypertension On lisinopril 40 mg p.o. daily, amlodipine 10 mg p.o. daily, HCTZ 12.5 mg  p.o. daily at home.   Type 2 diabetes mellitus Hemoglobin A1c 7.3 on 12/26/2021.  Home regimen includes metformin 500 mg  daily, Jardiance 10 mg p.o. daily.   Hyperlipidemia: Atorvastatin 10 mg p.o. daily   Chronic pain Opioid dependence Tylenol as needed mild pain, Norco every 8 hours as needed moderate pain.  Discussed continue avoidance/abstinence of NSAIDs.   QTc prolongation Currently on Aricept and Lexapro outpatient.  EKG on admission with prolonged QTc.  Outpatient follow-up with PCP.   Anxiety Lexapro   Dementia Aricept, Seroquel   Discharge Diagnoses:  Principal Problem:   GI bleeding Active Problems:   GAD (generalized anxiety disorder)   Hypertension associated with diabetes (HCC)   Type 2 diabetes mellitus with microalbuminuria, without long-term current use of insulin (HCC)   Dementia without behavioral disturbance (HCC)   Depression, major, single episode, moderate (HCC)   Hypokalemia   Cyst of right kidney    Discharge Instructions  Discharge Instructions     Call MD for:  difficulty breathing, headache or visual disturbances   Complete by: As directed    Call MD for:  extreme fatigue   Complete by: As directed    Call MD for:  persistant dizziness or light-headedness   Complete by: As directed    Call MD for:  persistant nausea and vomiting   Complete by: As directed    Call MD for:  severe uncontrolled pain   Complete by: As directed    Call MD for:  temperature >100.4   Complete by: As directed    Diet - low sodium heart healthy   Complete by: As directed    Increase activity slowly   Complete by: As directed       Allergies as of 02/07/2022   No Known Allergies      Medication List     STOP taking these medications    azithromycin 250 MG tablet Commonly known as: ZITHROMAX   loperamide 2 MG tablet Commonly known as: Imodium A-D   omeprazole 40 MG capsule Commonly known as: PRILOSEC       TAKE these medications    amLODipine 10 MG tablet Commonly known as: NORVASC Take 1 tablet (10 mg total) by mouth daily.   aspirin 81 MG chewable  tablet Chew 1 tablet (81 mg total) by mouth 2 (two) times daily. Start taking on: February 11, 2022 What changed: These instructions start on February 11, 2022. If you are unsure what to do until then, ask your doctor or other care provider.   atorvastatin 10 MG tablet Commonly known as: LIPITOR Take 1 tablet (10 mg total) by mouth daily.   azelastine 0.05 % ophthalmic solution Commonly known as: OPTIVAR Place 1 drop into both eyes 2 (two) times daily.   azelastine 0.1 % nasal spray Commonly known as: ASTELIN INSTILL 1 SPRAY INTO BOTH NOSTRILS TWICE DAILY   buPROPion 150 MG 24 hr tablet Commonly known as: Wellbutrin XL Take 1 tablet (150 mg total) by mouth daily.   celecoxib 200 MG capsule Commonly known as: CELEBREX   cyanocobalamin 500 MCG tablet Commonly known as: VITAMIN B12   donepezil 10 MG tablet Commonly known as: ARICEPT TAKE 1 TABLET(5 MG) BY MOUTH AT BEDTIME   empagliflozin 10 MG Tabs tablet Commonly known as: Jardiance TAKE 1 TABLET(10 MG) BY MOUTH DAILY BEFORE BREAKFAST   escitalopram 10 MG tablet Commonly known as: Lexapro Take 1 tablet (10 mg total) by mouth daily.   furosemide 40 MG tablet  Commonly known as: LASIX Take 1 tablet (40 mg total) by mouth daily.   HYDROcodone-acetaminophen 5-325 MG tablet Commonly known as: NORCO/VICODIN Take 1 tablet by mouth every 8 (eight) hours as needed for moderate pain. For chronic pain   hydrOXYzine 10 MG tablet Commonly known as: ATARAX Take 1 tablet (10 mg total) by mouth 3 (three) times daily as needed. What changed:  how much to take reasons to take this   lisinopril 40 MG tablet Commonly known as: ZESTRIL TAKE 1 TABLET(40 MG) BY MOUTH DAILY   meclizine 25 MG tablet Commonly known as: ANTIVERT Take 1 tablet (25 mg total) by mouth 3 (three) times daily as needed for dizziness.   pantoprazole 40 MG tablet Commonly known as: PROTONIX Take 1 tablet (40 mg total) by mouth 2 (two) times daily for 28 days,  THEN 1 tablet (40 mg total) daily. Start taking on: February 07, 2022 What changed: See the new instructions.   QUEtiapine 50 MG Tb24 24 hr tablet Commonly known as: SEROQUEL XR Take 2 tablets (100 mg total) by mouth at bedtime.   Refresh Optive 1-0.9 % Gel Generic drug: Carboxymethylcellul-Glycerin Place 1 drop into both eyes daily.        Follow-up Information     Brahmbhatt, Parag, MD. Schedule an appointment as soon as possible for a visit in 2 month(s).   Specialty: Gastroenterology Why: Follow-up for esophageal ulcer Contact information: [address] [place] [address] [phone]         Ardith Dark, MD. Schedule an appointment as soon as possible for a visit in 1 week(s).   Specialty: Family Medicine Contact information: [address] [place] [address] [phone]                No Known Allergies  Consultations: [facility] gastroenterology, Dr. Levora Angel   Procedures/Studies: CT ABDOMEN PELVIS W CONTRAST  Result Date: 02/04/2022 CLINICAL DATA:  Abdominal pain and hematochezia EXAM: CT ABDOMEN AND PELVIS WITH CONTRAST TECHNIQUE: Multidetector CT imaging of the abdomen and pelvis was performed using the standard protocol following bolus administration of intravenous contrast. RADIATION DOSE REDUCTION: This exam was performed according to the departmental dose-optimization program which includes automated exposure control, adjustment of the mA and/or kV according to patient size and/or use of iterative reconstruction technique. CONTRAST:  65mL OMNIPAQUE IOHEXOL 350 MG/ML SOLN COMPARISON:  None Available. FINDINGS: Lower chest: Lung bases are free of acute infiltrate. Minimal scarring is noted bilaterally. Hepatobiliary: Gallbladder is well distended without evidence of cholelithiasis. Liver shows a few scattered hypodensities without significant change on delayed images consistent with small cysts. Biliary ductal system shows no  obstructive change. Pancreas: Unremarkable. No pancreatic ductal dilatation or surrounding inflammatory changes. Spleen: Normal in size without focal abnormality. Adrenals/Urinary Tract: Adrenal glands are within normal limits. Right kidney demonstrates multiple cystic lesions. One of these is simple in appearance measuring up to 4.6 cm. The second measures 3.2 cm in demonstrates significant calcification along the wall. Left kidney also demonstrates cystic change which appears simple in nature. The largest of these measures 5.4 cm. Punctate nonobstructing renal stone is noted on the left in the lower pole. No obstructive changes are seen. The bladder is well distended. Stomach/Bowel: The appendix is well visualized. No obstructive or inflammatory changes of the colon are seen. Small bowel and stomach appear within normal limits. Vascular/Lymphatic: No significant vascular findings are present. No enlarged abdominal or pelvic lymph nodes. Reproductive: Uterus and bilateral adnexa are unremarkable. Other:  No free fluid is noted. Small fat containing umbilical hernia is noted. Musculoskeletal: Degenerative changes of lumbar spine are seen. IMPRESSION: Cystic changes are noted within both kidneys. The predominance of these are simple in nature. Some calcifications are noted within 1 of the right renal cysts however. No significant enhancement is noted and the cyst is otherwise simple in appearance. Short-term follow-up with ultrasound of the kidneys in 3-6 months is recommended to assess for stability. No findings to correspond with the given clinical history are noted. Punctate nonobstructing left renal stone. Electronically Signed   By: Alcide Clever M.D.   On: 02/04/2022 21:36   DG CHEST PORT 1 VIEW  Result Date: 01/12/2022 CLINICAL DATA:  Chest pain EXAM: PORTABLE CHEST 1 VIEW COMPARISON:  06/06/2013 FINDINGS: Eventration of the right diaphragm. Normal heart size and mediastinal contours. No acute infiltrate or  edema. No effusion or pneumothorax. No acute osseous findings. Generalized degenerative endplate spurring which is bulky. IMPRESSION: No evidence of active disease. Electronically Signed   By: Tiburcio Pea M.D.   On: 01/12/2022 08:17     Subjective: Patient seen examined bedside, resting comfortably.  Lying in bed.  No specific complaints this morning.  Hemoglobin stable, 11.0.  Patient ready for discharge home.  No other questions or concerns at this time.  Discussed extensively avoidance of all NSAIDs in the future as this is likely etiology to her presenting symptoms with a GE junction ulcer and overlying gastritis.  Patient aware of need for follow-up with GI in 2 months with repeat EGD 3 months.  Denies headache, no dizziness, no chest pain, no palpitations, no shortness of breath, no abdominal pain, no focal weakness, no fatigue, no fever/chills/night sweats, no nausea cefonicid diarrhea, no paresthesias.  No acute events overnight per nursing staff.  Discharge Exam: Vitals:   02/07/22 0438 02/07/22 0739  BP: 137/60 (!) 149/64  Pulse: 63 67  Resp: 17 18  Temp: 98.4 F (36.9 C) 98.7 F (37.1 C)  SpO2: 99% 100%   Vitals:   02/07/22 0039 02/07/22 0438 02/07/22 0500 02/07/22 0739  BP: 137/65 137/60  (!) 149/64  Pulse: 69 63  67  Resp: 18 17  18   Temp: 97.8 F (36.6 C) 98.4 F (36.9 C)  98.7 F (37.1 C)  TempSrc: Oral Oral  Oral  SpO2: 100% 99%  100%  Weight:   73.3 kg   Height:        Physical Exam: GEN: NAD, alert and oriented x 3, elderly in appearance HEENT: NCAT, PERRL, EOMI, sclera clear, MMM PULM: CTAB w/o wheezes/crackles, normal respiratory effort, on room air CV: RRR w/o M/G/R GI: abd soft, NTND, NABS, no R/G/M MSK: no peripheral edema, muscle strength globally intact 5/5 bilateral upper/lower extremities NEURO: CN II-XII intact, no focal deficits, sensation to light touch intact PSYCH: normal mood/affect Integumentary: dry/intact, no rashes or wounds    The  results of significant diagnostics from this hospitalization (including imaging, microbiology, ancillary and laboratory) are listed below for reference.     Microbiology: Recent Results (from the past 240 hour(s))  Resp panel by RT-PCR (RSV, Flu A&B, Covid) Anterior Nasal Swab     Status: None   Collection Time: 02/04/22  7:32 PM   Specimen: Anterior Nasal Swab  Result Value Ref Range Status   SARS Coronavirus 2 by RT PCR NEGATIVE NEGATIVE Final    Comment: (NOTE) SARS-CoV-2 target nucleic acids are NOT DETECTED.  The SARS-CoV-2 RNA is generally detectable in upper respiratory specimens during  the acute phase of infection. The lowest concentration of SARS-CoV-2 viral copies this assay can detect is 138 copies/mL. A negative result does not preclude SARS-Cov-2 infection and should not be used as the sole basis for treatment or other patient management decisions. A negative result may occur with  improper specimen collection/handling, submission of specimen other than nasopharyngeal swab, presence of viral mutation(s) within the areas targeted by this assay, and inadequate number of viral copies(<138 copies/mL). A negative result must be combined with clinical observations, patient history, and epidemiological information. The expected result is Negative.  Fact Sheet for Patients:  BloggerCourse.com  Fact Sheet for Healthcare Providers:  SeriousBroker.it  This test is no t yet approved or cleared by the [place] FDA and  has been authorized for detection and/or diagnosis of SARS-CoV-2 by FDA under an Emergency Use Authorization (EUA). This EUA will remain  in effect (meaning this test can be used) for the duration of the COVID-19 declaration under Section 564(b)(1) of the Act, 21 U.S.C.section 360bbb-3(b)(1), unless the authorization is terminated  or revoked sooner.       Influenza A by PCR NEGATIVE NEGATIVE Final    Influenza B by PCR NEGATIVE NEGATIVE Final    Comment: (NOTE) The Xpert Xpress SARS-CoV-2/FLU/RSV plus assay is intended as an aid in the diagnosis of influenza from Nasopharyngeal swab specimens and should not be used as a sole basis for treatment. Nasal washings and aspirates are unacceptable for Xpert Xpress SARS-CoV-2/FLU/RSV testing.  Fact Sheet for Patients: BloggerCourse.com  Fact Sheet for Healthcare Providers: SeriousBroker.it  This test is not yet approved or cleared by the [place] FDA and has been authorized for detection and/or diagnosis of SARS-CoV-2 by FDA under an Emergency Use Authorization (EUA). This EUA will remain in effect (meaning this test can be used) for the duration of the COVID-19 declaration under Section 564(b)(1) of the Act, 21 U.S.C. section 360bbb-3(b)(1), unless the authorization is terminated or revoked.     Resp Syncytial Virus by PCR NEGATIVE NEGATIVE Final    Comment: (NOTE) Fact Sheet for Patients: BloggerCourse.com  Fact Sheet for Healthcare Providers: SeriousBroker.it  This test is not yet approved or cleared by the [place] FDA and has been authorized for detection and/or diagnosis of SARS-CoV-2 by FDA under an Emergency Use Authorization (EUA). This EUA will remain in effect (meaning this test can be used) for the duration of the COVID-19 declaration under Section 564(b)(1) of the Act, 21 U.S.C. section 360bbb-3(b)(1), unless the authorization is terminated or revoked.  Performed at [facility], [address] [place], [place] [address]      Labs: BNP (last 3 results) No results for input(s): "BNP" in the last 8760 hours. Basic Metabolic Panel: Recent Labs  Lab 02/04/22 1926 02/05/22 0025 02/05/22 0248 02/06/22 0323 02/07/22 0307  NA 141 140 143 142 142  K 2.4* 3.8 3.4* 3.3* 4.1  CL 99 103 104 105 109   CO2 28 28 28 27 25   GLUCOSE 263* 171* 159* 128* 107*  BUN 13 10 9 10 15   CREATININE 0.96 0.85 0.85 0.91 0.84  CALCIUM 9.7 8.9 9.0 9.3 9.3  MG  --  2.1  --  2.0 1.9   Liver Function Tests: Recent Labs  Lab 02/04/22 1926  AST 30  ALT 21  ALKPHOS 84  BILITOT 0.5  PROT 7.7  ALBUMIN 4.4   Recent Labs  Lab 02/04/22 1926  LIPASE 36   No results for input(s): "AMMONIA" in the  last 168 hours. CBC: Recent Labs  Lab 02/04/22 1926 02/05/22 0248 02/05/22 1627 02/05/22 2045 02/06/22 0323 02/06/22 1003 02/07/22 0307  WBC 19.0*   < > 9.4 9.2 8.2 7.1 8.4  NEUTROABS 15.7*  --   --   --   --   --   --   HGB 13.2   < > 11.5* 11.8* 11.4* 12.1 11.0*  HCT 40.3   < > 35.5* 35.9* 34.7* 36.9 33.1*  MCV 84.8   < > 85.3 84.9 85.5 85.0 84.9  PLT 295   < > 281 259 253 252 242   < > = values in this interval not displayed.   Cardiac Enzymes: No results for input(s): "CKTOTAL", "CKMB", "CKMBINDEX", "TROPONINI" in the last 168 hours. BNP: Invalid input(s): "POCBNP" CBG: Recent Labs  Lab 02/06/22 1614 02/06/22 2043 02/07/22 0040 02/07/22 0440 02/07/22 0743  GLUCAP 116* 176* 90 146* 133*   D-Dimer No results for input(s): "DDIMER" in the last 72 hours. Hgb A1c No results for input(s): "HGBA1C" in the last 72 hours. Lipid Profile No results for input(s): "CHOL", "HDL", "LDLCALC", "TRIG", "CHOLHDL", "LDLDIRECT" in the last 72 hours. Thyroid function studies No results for input(s): "TSH", "T4TOTAL", "T3FREE", "THYROIDAB" in the last 72 hours.  Invalid input(s): "FREET3" Anemia work up No results for input(s): "VITAMINB12", "FOLATE", "FERRITIN", "TIBC", "IRON", "RETICCTPCT" in the last 72 hours. Urinalysis    Component Value Date/Time   COLORURINE STRAW (A) 02/04/2022 2211   APPEARANCEUR CLEAR 02/04/2022 2211   APPEARANCEUR Clear 04/04/2018 1157   LABSPEC 1.017 02/04/2022 2211   PHURINE 7.0 02/04/2022 2211   GLUCOSEU >=500 (A) 02/04/2022 2211   GLUCOSEU NEGATIVE 04/15/2021  0942   HGBUR SMALL (A) 02/04/2022 2211   HGBUR trace-intact 12/09/2009 0000   BILIRUBINUR NEGATIVE 02/04/2022 2211   BILIRUBINUR Negative 04/04/2018 1157   KETONESUR NEGATIVE 02/04/2022 2211   PROTEINUR NEGATIVE 02/04/2022 2211   UROBILINOGEN 0.2 04/15/2021 0942   NITRITE NEGATIVE 02/04/2022 2211   LEUKOCYTESUR NEGATIVE 02/04/2022 2211   Sepsis Labs Recent Labs  Lab 02/05/22 2045 02/06/22 0323 02/06/22 1003 02/07/22 0307  WBC 9.2 8.2 7.1 8.4   Microbiology Recent Results (from the past 240 hour(s))  Resp panel by RT-PCR (RSV, Flu A&B, Covid) Anterior Nasal Swab     Status: None   Collection Time: 02/04/22  7:32 PM   Specimen: Anterior Nasal Swab  Result Value Ref Range Status   SARS Coronavirus 2 by RT PCR NEGATIVE NEGATIVE Final    Comment: (NOTE) SARS-CoV-2 target nucleic acids are NOT DETECTED.  The SARS-CoV-2 RNA is generally detectable in upper respiratory specimens during the acute phase of infection. The lowest concentration of SARS-CoV-2 viral copies this assay can detect is 138 copies/mL. A negative result does not preclude SARS-Cov-2 infection and should not be used as the sole basis for treatment or other patient management decisions. A negative result may occur with  improper specimen collection/handling, submission of specimen other than nasopharyngeal swab, presence of viral mutation(s) within the areas targeted by this assay, and inadequate number of viral copies(<138 copies/mL). A negative result must be combined with clinical observations, patient history, and epidemiological information. The expected result is Negative.  Fact Sheet for Patients:  BloggerCourse.comhttps://www.fda.gov/media/152166/download  Fact Sheet for Healthcare Providers:  SeriousBroker.ithttps://www.fda.gov/media/152162/download  This test is no t yet approved or cleared by the [place] FDA and  has been authorized for detection and/or diagnosis of SARS-CoV-2 by FDA under an Emergency Use Authorization  (EUA). This EUA will remain  in effect (meaning this test can be used) for the duration of the COVID-19 declaration under Section 564(b)(1) of the Act, 21 U.S.C.section 360bbb-3(b)(1), unless the authorization is terminated  or revoked sooner.       Influenza A by PCR NEGATIVE NEGATIVE Final   Influenza B by PCR NEGATIVE NEGATIVE Final    Comment: (NOTE) The Xpert Xpress SARS-CoV-2/FLU/RSV plus assay is intended as an aid in the diagnosis of influenza from Nasopharyngeal swab specimens and should not be used as a sole basis for treatment. Nasal washings and aspirates are unacceptable for Xpert Xpress SARS-CoV-2/FLU/RSV testing.  Fact Sheet for Patients: EntrepreneurPulse.com.au  Fact Sheet for Healthcare Providers: IncredibleEmployment.be  This test is not yet approved or cleared by the [place] FDA and has been authorized for detection and/or diagnosis of SARS-CoV-2 by FDA under an Emergency Use Authorization (EUA). This EUA will remain in effect (meaning this test can be used) for the duration of the COVID-19 declaration under Section 564(b)(1) of the Act, 21 U.S.C. section 360bbb-3(b)(1), unless the authorization is terminated or revoked.     Resp Syncytial Virus by PCR NEGATIVE NEGATIVE Final    Comment: (NOTE) Fact Sheet for Patients: EntrepreneurPulse.com.au  Fact Sheet for Healthcare Providers: IncredibleEmployment.be  This test is not yet approved or cleared by the [place] FDA and has been authorized for detection and/or diagnosis of SARS-CoV-2 by FDA under an Emergency Use Authorization (EUA). This EUA will remain in effect (meaning this test can be used) for the duration of the COVID-19 declaration under Section 564(b)(1) of the Act, 21 U.S.C. section 360bbb-3(b)(1), unless the authorization is terminated or revoked.  Performed at [facility], [place] [address]  [place], [place] [address]      Time coordinating discharge: Over 30 minutes  SIGNED:   Strother Everitt J [place] DO  [facility] 02/07/2022, 8:41 AM

## 2022-02-07 NOTE — Progress Notes (Signed)
AM meds given. PIV discontinued.  Cath intact. Gauze and tae applied. Discharge instructions given. No concerns voiced. Pt encouraged to stop by home pharmacy and pick up med that was e-prescribed by MD. Voiced understanding. Awaiting ride for transport to home.

## 2022-02-07 NOTE — Plan of Care (Signed)

## 2022-02-08 ENCOUNTER — Encounter (HOSPITAL_COMMUNITY): Payer: Self-pay | Admitting: Gastroenterology

## 2022-02-08 LAB — BPAM RBC
Blood Product Expiration Date: 202401302359
Blood Product Expiration Date: 202402242359
ISSUE DATE / TIME: 202401151318
Unit Type and Rh: 5100
Unit Type and Rh: 7300

## 2022-02-08 LAB — TYPE AND SCREEN
ABO/RH(D): AB POS
Antibody Screen: POSITIVE
Donor AG Type: NEGATIVE
Donor AG Type: NEGATIVE
Unit division: 0
Unit division: 0

## 2022-02-09 ENCOUNTER — Telehealth: Payer: Self-pay | Admitting: Family Medicine

## 2022-02-09 ENCOUNTER — Other Ambulatory Visit: Payer: Self-pay | Admitting: *Deleted

## 2022-02-09 ENCOUNTER — Telehealth: Payer: Self-pay | Admitting: *Deleted

## 2022-02-09 DIAGNOSIS — F5104 Psychophysiologic insomnia: Secondary | ICD-10-CM

## 2022-02-09 LAB — SURGICAL PATHOLOGY

## 2022-02-09 MED ORDER — QUETIAPINE FUMARATE ER 50 MG PO TB24: 100.0000 mg | ORAL_TABLET | Freq: Every day | ORAL | 0 refills | Status: DC

## 2022-02-09 NOTE — Telephone Encounter (Signed)
Transition Care Management Follow-up Telephone Call Date of discharge and from where: 02/07/22 from [facility] How have you been since you were released from the hospital? Feeling better. Any questions or concerns? No  Items Reviewed: Did the pt receive and understand the discharge instructions provided? Yes  Medications obtained and verified? Yes  Other? No  Any new allergies since your discharge? No  Dietary orders reviewed? Yes Do you have support at home? No, lives by herself. Stated that she is doing well by herself.  Home Care and Equipment/Supplies: Were home health services ordered? no If so, what is the name of the agency? N/A Has the agency set up a time to come to the patient's home? no Were any new equipment or medical supplies ordered?  No What is the name of the medical supply agency? N/A Were you able to get the supplies/equipment? not applicable Do you have any questions related to the use of the equipment or supplies? No  Functional Questionnaire: (I = Independent and D = Dependent) ADLs: I  Bathing/Dressing- I  Meal Prep- I  Eating- I  Maintaining continence- I  Transferring/Ambulation- I  Managing Meds- I  Follow up appointments reviewed:  PCP Hospital f/u appt confirmed? Yes  Scheduled to see Dr. Jerline Pain on 02/12/22 @ 11:40. [facility] f/u appt confirmed? No    Are transportation arrangements needed? No If their condition worsens, is the pt aware to call PCP or go to the Emergency Dept.? Yes Was the patient provided with contact information for the PCP's office or ED? Yes Was to pt encouraged to call back with questions or concerns? Yes

## 2022-02-09 NOTE — Telephone Encounter (Signed)
Patient states Pharmacy never received RX that was prescribed on 01/26/22 for QUEtiapine (SEROQUEL XR) 50 MG TB24 24 hr tablet (Showing as a No Print)  Requests RX for the above be sent asap to [facility] [name] - [place], [place] - [address] AT [place] Phone: [phone]  Fax: [phone]

## 2022-02-09 NOTE — Telephone Encounter (Signed)
Rx re send to [facility]

## 2022-02-12 ENCOUNTER — Encounter: Payer: Self-pay | Admitting: Family Medicine

## 2022-02-12 ENCOUNTER — Other Ambulatory Visit: Payer: Self-pay | Admitting: Family Medicine

## 2022-02-12 ENCOUNTER — Ambulatory Visit (INDEPENDENT_AMBULATORY_CARE_PROVIDER_SITE_OTHER): Payer: 59 | Admitting: Family Medicine

## 2022-02-12 VITALS — BP 123/79 | HR 90 | Temp 98.4°F | Ht 62.0 in | Wt 161.6 lb

## 2022-02-12 DIAGNOSIS — E1159 Type 2 diabetes mellitus with other circulatory complications: Secondary | ICD-10-CM

## 2022-02-12 DIAGNOSIS — K279 Peptic ulcer, site unspecified, unspecified as acute or chronic, without hemorrhage or perforation: Secondary | ICD-10-CM

## 2022-02-12 DIAGNOSIS — J329 Chronic sinusitis, unspecified: Secondary | ICD-10-CM | POA: Diagnosis not present

## 2022-02-12 DIAGNOSIS — I152 Hypertension secondary to endocrine disorders: Secondary | ICD-10-CM

## 2022-02-12 DIAGNOSIS — N281 Cyst of kidney, acquired: Secondary | ICD-10-CM

## 2022-02-12 DIAGNOSIS — F411 Generalized anxiety disorder: Secondary | ICD-10-CM

## 2022-02-12 DIAGNOSIS — H8110 Benign paroxysmal vertigo, unspecified ear: Secondary | ICD-10-CM

## 2022-02-12 LAB — CBC
HCT: 39.4 % (ref 36.0–46.0)
Hemoglobin: 13.1 g/dL (ref 12.0–15.0)
MCHC: 33.2 g/dL (ref 30.0–36.0)
MCV: 85.6 fl (ref 78.0–100.0)
Platelets: 337 10*3/uL (ref 150.0–400.0)
RBC: 4.6 Mil/uL (ref 3.87–5.11)
RDW: 15.6 % — ABNORMAL HIGH (ref 11.5–15.5)
WBC: 8.2 10*3/uL (ref 4.0–10.5)

## 2022-02-12 LAB — COMPREHENSIVE METABOLIC PANEL
ALT: 12 U/L (ref 0–35)
AST: 13 U/L (ref 0–37)
Albumin: 4.6 g/dL (ref 3.5–5.2)
Alkaline Phosphatase: 94 U/L (ref 39–117)
BUN: 23 mg/dL (ref 6–23)
CO2: 27 mEq/L (ref 19–32)
Calcium: 10.1 mg/dL (ref 8.4–10.5)
Chloride: 102 mEq/L (ref 96–112)
Creatinine, Ser: 2.09 mg/dL — ABNORMAL HIGH (ref 0.40–1.20)
GFR: 21.29 mL/min — ABNORMAL LOW (ref >60.00)
Glucose, Bld: 149 mg/dL — ABNORMAL HIGH (ref 70–99)
Potassium: 3.7 mEq/L (ref 3.5–5.1)
Sodium: 142 mEq/L (ref 135–145)
Total Bilirubin: 0.4 mg/dL (ref 0.2–1.2)
Total Protein: 7.7 g/dL (ref 6.0–8.3)

## 2022-02-12 MED ORDER — HYDROXYZINE HCL 10 MG PO TABS: 10.0000 mg | ORAL_TABLET | Freq: Three times a day (TID) | ORAL | 1 refills | Status: DC | PRN

## 2022-02-12 NOTE — Assessment & Plan Note (Signed)
Incidentally found while hospitalized.  We will repeat renal ultrasound in 3 to 6 months per recommendation of radiology.

## 2022-02-12 NOTE — Patient Instructions (Addendum)
It was very nice to see you today!  I am glad that you are feeling better.  We will check blood work today.   I will refer you to see a doctor for your sinuses.  We will get an ultrasound on your kidneys in 3 to 6 months.  I will see back in 3 months to recheck your blood sugar.  Come back sooner if needed.  Take care, Dr Jerline Pain  PLEASE NOTE:  If you had any lab tests, please let us know if you have not heard back within a few days. You may see your results on mychart before we have a chance to review them but we will give you a call once they are reviewed by [name].   If we ordered any referrals today, please let us know if you have not heard from their office within the next week.   If you had any urgent prescriptions sent in today, please check with the pharmacy within an hour of our visit to make sure the prescription was transmitted appropriately.   Please try these tips to maintain a healthy lifestyle:  Eat at least 3 REAL meals and 1-2 snacks per day.  Aim for no more than 5 hours between eating.  If you eat breakfast, please do so within one hour of getting up.   Each meal should contain half fruits/vegetables, one quarter protein, and one quarter carbs (no bigger than a computer mouse)  Cut down on sweet beverages. This includes juice, soda, and sweet tea.   Drink at least 1 glass of water with each meal and aim for at least 8 glasses per day  Exercise at least 150 minutes every week.

## 2022-02-12 NOTE — Assessment & Plan Note (Signed)
No improvement with multiple rounds of antibiotics.  Will place referral to ENT for further evaluation and management.

## 2022-02-12 NOTE — Progress Notes (Signed)
Chief Complaint:  Casey [name] is a 87 y.o. female who presents today for a TCM visit.  Assessment/Plan:  Chronic Problems Addressed Today: PUD (peptic ulcer disease) Doing well since being discharged.  On Protonix 40 mg twice daily.  She has follow-up with GI coming up.  No signs of GI bleeding.  Will check CBC today.  She will continue twice daily PPI until she can follow-up with GI.  She will avoid NSAIDs going forward.  Renal cyst, Bilateral Incidentally found while hospitalized.  We will repeat renal ultrasound in 3 to 6 months per recommendation of radiology.  Hypertension associated with diabetes ([place]) Blood pressure at goal today on HCTZ 12.5 mg daily, amlodipine 10 mg daily, and lisinopril 40 mg daily.  Sinusitis, chronic No improvement with multiple rounds of antibiotics.  Will place referral to ENT for further evaluation and management.  BENIGN POSITIONAL VERTIGO Still has ongoing issues.  We did discuss referral to vestibular rehab however she deferred.  Will be placing referral to ENT as above.  GAD (generalized anxiety disorder) Recently increase hydroxyzine to 20 mg daily as needed.  She is doing well with this.  Will refill today.  Continue Lexapro 10 mg daily and Seroquel 100 mg nightly.     Subjective:  HPI:  Summary of Hospital admission: Reason for admission: GI bleed secondary to gastritis and peptic ulcer disease Date of admission: 02/04/2022 Date of discharge: 02/07/2022 Date of Interactive contact: 02/09/2022 Summary of Hospital course: Patient presented to the ED on 02/04/2022 with 1 day of nausea vomiting with dark tarry stools.  Hemoglobin was stable at 13.2 on admission though did have a positive FOBT.  GI was consulted.  Started on IV Protonix.  Underwent EGD which showed peptic ulcer disease with GE junction ulceration and active bleeding.  She was discharged home in stable condition.  PUD was thought to be secondary to use of NSAIDs when she was  told to hold going forward.  She was restarted on aspirin after discharge.  She was continued on Protonix 40 mg twice daily for 4 weeks until she can follow-up with GI.  She was incidentally found to have bilateral renal cysts on CT scan performed they recommended repeat ultrasound in 3 to 6 months.  Interim history:  Since being home she has been doing well. She has not had any more episodes of vomiting. Still some nausea that seems to be improving. No more dark or tarry stool. No hematochezia. No shortness of breath. She does have some vertigo/lightheadedness that is at her baseline.   Still has ongoing issues with sinus congestion.  She would like to see ENT for this.  She has had a couple rounds of antibiotics without much improvement.  ROS: Per HPI, otherwise a complete review of systems was negative.   PMH:  The following were reviewed and entered/updated in epic: Past Medical History:  Diagnosis Date   ALLERGIC RHINITIS    Allergy    ANXIETY    ARM PAIN, RIGHT    BENIGN POSITIONAL VERTIGO    Carcinoma in situ of colon    CONSTIPATION    CYSTOCELE WITH INCOMPLETE UTERINE PROLAPSE    Diabetes mellitus without complication (HCC)    ESOPHAGEAL STRICTURE    FATIGUE    FLANK PAIN, LEFT    Headache(784.0)    HIP PAIN, LEFT    HYPERCHOLESTEROLEMIA    HYPERGLYCEMIA    HYPERTENSION, BENIGN ESSENTIAL 03/03/2007   LEG EDEMA, BILATERAL  Microscopic hematuria    OBESITY    OSTEOARTHRITIS, KNEE    OSTEOPOROSIS    Peripheral neuropathy, idiopathic    Problems with swallowing and mastication    SINUSITIS, RECURRENT    SPRAIN&STRAIN OTH SPEC SITES SHOULDER&UPPER ARM    Patient Active Problem List   Diagnosis Date Noted   Renal cyst, Bilateral 02/12/2022   Chronic pain 12/26/2021   Depression, major, single episode, moderate ([place]) 09/26/2021   Insomnia 09/26/2021   Dementia without behavioral disturbance ([place]) 01/03/2020   PUD (peptic ulcer disease) 07/17/2019   Vitamin B12  deficiency 07/14/2017   Osteoarthritis 07/07/2013   Type 2 diabetes mellitus with microalbuminuria, without long-term current use of insulin ([place]) 05/26/2012   Long Q-T syndrome 11/26/2010   CYSTOCELE WITH INCOMPLETE UTERINE PROLAPSE 03/10/2010   MICROSCOPIC HEMATURIA 07/29/2009   Sinusitis, chronic 02/23/2008   BENIGN POSITIONAL VERTIGO 04/25/2007   Dyslipidemia associated with type 2 diabetes mellitus ([place]) 03/03/2007   GAD (generalized anxiety disorder) 03/03/2007   Hypertension associated with diabetes ([place]) 03/03/2007   Allergic rhinitis 03/03/2007   Osteoporosis 03/03/2007   Past Surgical History:  Procedure Laterality Date   BIOPSY  02/06/2022   Procedure: BIOPSY;  Surgeon: Otis Brace, MD;  Location: [place];  Service: Gastroenterology;;   COLONOSCOPY     ESOPHAGOGASTRODUODENOSCOPY (EGD) WITH PROPOFOL N/A 02/06/2022   Procedure: ESOPHAGOGASTRODUODENOSCOPY (EGD) WITH PROPOFOL;  Surgeon: Otis Brace, MD;  Location: [facility];  Service: Gastroenterology;  Laterality: N/A;   KNEE ARTHROPLASTY Right 07/24/2020   Procedure: COMPUTER ASSISTED TOTAL KNEE ARTHROPLASTY;  Surgeon: Rod Can, MD;  Location: [facility];  Service: Orthopedics;  Laterality: Right;   LEFT HEART CATHETERIZATION WITH CORONARY ANGIOGRAM N/A 12/02/2010   Procedure: LEFT HEART CATHETERIZATION WITH CORONARY ANGIOGRAM;  Surgeon: Peter M Martinique, MD;  Location: [facility] CATH LAB;  Service: Cardiovascular;:: NORMAL CORONARY ARTERIES - NORMAL LV FUNCTION & EDP.     TRANSTHORACIC ECHOCARDIOGRAM  11/2010   Normal EF 60-65%. No RWMA.  Mild LAD dilation c/w Gr 1 DD.  -- PRETTY NORMAL ECHO    Family History  Problem Relation Age of Onset   Coronary artery disease Mother 84   Cancer Father 52   Heart disease Father    Colon cancer Other    Diabetes Other    Coronary artery disease Sister    Diabetes type II Sister    Cancer Sister    Coronary artery disease Brother    Kidney disease Brother    Coronary  artery disease Brother    Breast cancer Neg Hx     Medications- Reconciled discharge and current medications in Epic.  Current Outpatient Medications  Medication Sig Dispense Refill   amLODipine (NORVASC) 10 MG tablet Take 1 tablet (10 mg total) by mouth daily. 90 tablet 1   aspirin 81 MG chewable tablet Chew 1 tablet (81 mg total) by mouth 2 (two) times daily.     atorvastatin (LIPITOR) 10 MG tablet Take 1 tablet (10 mg total) by mouth daily. 90 tablet 3   azelastine (ASTELIN) 0.1 % nasal spray INSTILL 1 SPRAY INTO BOTH NOSTRILS TWICE DAILY 30 mL 12   azelastine (OPTIVAR) 0.05 % ophthalmic solution Place 1 drop into both eyes 2 (two) times daily.     buPROPion (WELLBUTRIN XL) 150 MG 24 hr tablet Take 1 tablet (150 mg total) by mouth daily. 90 tablet 1   Carboxymethylcellul-Glycerin (REFRESH OPTIVE) 1-0.9 % GEL Place 1 drop into both eyes daily.     cyanocobalamin (VITAMIN B12) 500  MCG tablet      donepezil (ARICEPT) 10 MG tablet TAKE 1 TABLET(5 MG) BY MOUTH AT BEDTIME 90 tablet 5   empagliflozin (JARDIANCE) 10 MG TABS tablet TAKE 1 TABLET(10 MG) BY MOUTH DAILY BEFORE BREAKFAST 90 tablet 0   escitalopram (LEXAPRO) 10 MG tablet Take 1 tablet (10 mg total) by mouth daily. 90 tablet 1   furosemide (LASIX) 40 MG tablet Take 1 tablet (40 mg total) by mouth daily. 90 tablet 1   HYDROcodone-acetaminophen (NORCO/VICODIN) 5-325 MG tablet Take 1 tablet by mouth every 8 (eight) hours as needed for moderate pain. For chronic pain 90 tablet 0   lisinopril (ZESTRIL) 40 MG tablet TAKE 1 TABLET(40 MG) BY MOUTH DAILY 90 tablet 1   meclizine (ANTIVERT) 25 MG tablet Take 1 tablet (25 mg total) by mouth 3 (three) times daily as needed for dizziness. 30 tablet 0   pantoprazole (PROTONIX) 40 MG tablet Take 1 tablet (40 mg total) by mouth 2 (two) times daily for 28 days, THEN 1 tablet (40 mg total) daily. 146 tablet 0   QUEtiapine (SEROQUEL XR) 50 MG TB24 24 hr tablet Take 2 tablets (100 mg total) by mouth at  bedtime. 90 tablet 0   hydrOXYzine (ATARAX) 10 MG tablet Take 1-2 tablets (10-20 mg total) by mouth 3 (three) times daily as needed. 180 tablet 1   No current facility-administered medications for this visit.    Allergies-reviewed and updated No Known Allergies  Social History   Socioeconomic History   Marital status: Single    Spouse name: Not on file   Number of children: 1   Years of education: 12   Highest education level: High school graduate  Occupational History   Not on file  Tobacco Use   Smoking status: Never   Smokeless tobacco: Never  Vaping Use   Vaping Use: Never used  Substance and Sexual Activity   Alcohol use: No   Drug use: No   Sexual activity: Not Currently  Other Topics Concern   Not on file  Social History Narrative   She tries to stay active, but over the last year (20 19-20 20), she has been very limited by her right knee pain.   Is able to go grocery shopping.   Social Determinants of Health   Financial Resource Strain: Low Risk  (12/25/2016)   Overall Financial Resource Strain (CARDIA)    Difficulty of Paying Living Expenses: Not very hard  Food Insecurity: No Food Insecurity (02/05/2022)   Hunger Vital Sign    Worried About Running Out of Food in the Last Year: Never true    Ran Out of Food in the Last Year: Never true  Transportation Needs: No Transportation Needs (12/25/2016)   PRAPARE - Administrator, Civil Service (Medical): No    Lack of Transportation (Non-Medical): No  Physical Activity: Inactive (12/25/2016)   Exercise Vital Sign    Days of Exercise per Week: 0 days    Minutes of Exercise per Session: 0 min  Stress: No Stress Concern Present (12/25/2016)   [employer] of Occupational Health - Occupational Stress Questionnaire    Feeling of Stress : Only a little  Social Connections: Moderately Isolated (12/25/2016)   Social Connection and Isolation Panel [NHANES]    Frequency of Communication with Friends and  Family: Once a week    Frequency of Social Gatherings with Friends and Family: Once a week    Attends Religious Services: More than 4 times per year  Active Member of Clubs or Organizations: No    Attends Archivist Meetings: Never    Marital Status: Never married        Objective:  Physical Exam: BP 123/79   Pulse 90   Temp 98.4 F (36.9 C) (Temporal)   Ht 5\' 2"  (1.575 m)   Wt 161 lb 9.6 oz (73.3 kg)   SpO2 99%   BMI 29.56 kg/m   Gen: NAD, resting comfortably CV: RRR with no murmurs appreciated Pulm: NWOB, CTAB with no crackles, wheezes, or rhonchi GI: Normal bowel sounds present. Soft, Nontender, Nondistended. MSK: No edema, cyanosis, or clubbing noted Skin: Warm, dry Neuro: Grossly normal, moves all extremities Psych: Normal affect and thought content  Time Spent: 45 minutes of total time was spent on the date of the encounter performing the following actions: chart review prior to seeing the patient including recent hospitalization, obtaining history, performing a medically necessary exam, counseling on the treatment plan, placing orders, and documenting in our EHR.        Algis Greenhouse. Jerline Pain, MD 02/12/2022 12:54 PM

## 2022-02-12 NOTE — Assessment & Plan Note (Signed)
Recently increase hydroxyzine to 20 mg daily as needed.  She is doing well with this.  Will refill today.  Continue Lexapro 10 mg daily and Seroquel 100 mg nightly.

## 2022-02-12 NOTE — Assessment & Plan Note (Addendum)
Doing well since being discharged.  On Protonix 40 mg twice daily.  She has follow-up with GI coming up.  No signs of GI bleeding.  Will check CBC today.  She will continue twice daily PPI until she can follow-up with GI.  She will avoid NSAIDs going forward.

## 2022-02-12 NOTE — Assessment & Plan Note (Signed)
Blood pressure at goal today on HCTZ 12.5 mg daily, amlodipine 10 mg daily, and lisinopril 40 mg daily.

## 2022-02-12 NOTE — Assessment & Plan Note (Signed)
Still has ongoing issues.  We did discuss referral to vestibular rehab however she deferred.  Will be placing referral to ENT as above.

## 2022-02-13 NOTE — Progress Notes (Signed)
Please inform patient of the following:  She has had a significant increase in her kidney numbers.  Please make sure that she is not taking any anti-inflammatories such as ibuprofen or Aleve.  Recommend that she stop taking her lisinopril.  I would like for her to come back early next week to recheck.  Please place future order for be met.

## 2022-02-16 ENCOUNTER — Other Ambulatory Visit: Payer: Self-pay | Admitting: Family Medicine

## 2022-02-16 DIAGNOSIS — M170 Bilateral primary osteoarthritis of knee: Secondary | ICD-10-CM

## 2022-02-16 NOTE — Telephone Encounter (Signed)
   LAST APPOINTMENT DATE:  02/12/22  NEXT APPOINTMENT DATE: None  MEDICATION: HYDROcodone-acetaminophen (NORCO/VICODIN) 5-325 MG tablet  meclizine (ANTIVERT) 25 MG tablet   Is the patient out of medication? Yes   PHARMACY:  [facility] [place], [address] AT [place], [place] [address] Phone: [phone]  Fax: [phone]

## 2022-02-17 ENCOUNTER — Telehealth: Payer: Self-pay | Admitting: Family Medicine

## 2022-02-17 MED ORDER — HYDROCODONE-ACETAMINOPHEN 5-325 MG PO TABS: 1.0000 | ORAL_TABLET | Freq: Three times a day (TID) | ORAL | 0 refills | Status: DC | PRN

## 2022-02-17 NOTE — Telephone Encounter (Signed)
meclizine (ANTIVERT) 25 MG tablet    Encourage patient to contact the pharmacy for refills or they can request refills through [place]:  Please schedule appointment if longer than 1 year  NEXT APPOINTMENT DATE:  MEDICATION:  meclizine (ANTIVERT) 25 MG tablet   Is the patient out of medication? Yes  PHARMACY:  [facility] [phone] - [place], [address] AT [place] Phone: [phone]  Fax: [phone]      Let patient know to contact pharmacy at the end of the day to make sure medication is ready.  Please notify patient to allow 48-72 hours to process

## 2022-02-18 ENCOUNTER — Other Ambulatory Visit: Payer: Self-pay | Admitting: *Deleted

## 2022-02-18 DIAGNOSIS — E1129 Type 2 diabetes mellitus with other diabetic kidney complication: Secondary | ICD-10-CM

## 2022-02-18 MED ORDER — MECLIZINE HCL 25 MG PO TABS: 25.0000 mg | ORAL_TABLET | Freq: Three times a day (TID) | ORAL | 0 refills | Status: DC | PRN

## 2022-02-19 NOTE — Telephone Encounter (Signed)
Rx send on 02/18/2022

## 2022-02-27 ENCOUNTER — Other Ambulatory Visit (INDEPENDENT_AMBULATORY_CARE_PROVIDER_SITE_OTHER): Payer: 59

## 2022-02-27 DIAGNOSIS — E1129 Type 2 diabetes mellitus with other diabetic kidney complication: Secondary | ICD-10-CM | POA: Diagnosis not present

## 2022-02-27 DIAGNOSIS — R809 Proteinuria, unspecified: Secondary | ICD-10-CM

## 2022-02-27 LAB — BASIC METABOLIC PANEL
BUN: 9 mg/dL (ref 6–23)
CO2: 27 mEq/L (ref 19–32)
Calcium: 9.7 mg/dL (ref 8.4–10.5)
Chloride: 100 mEq/L (ref 96–112)
Creatinine, Ser: 1.07 mg/dL (ref 0.40–1.20)
GFR: 47.52 mL/min — ABNORMAL LOW (ref >60.00)
Glucose, Bld: 183 mg/dL — ABNORMAL HIGH (ref 70–99)
Potassium: 3.3 mEq/L — ABNORMAL LOW (ref 3.5–5.1)
Sodium: 141 mEq/L (ref 135–145)

## 2022-03-02 NOTE — Progress Notes (Signed)
Please inform patient of the following:  Her kidney numbers are back to baseline however her potassium number dropped a little bit.  This is stable compared to previous values.  I would like for her to stay off of lisinopril for now.  Please have her come back this week for an office visit so that we can check her blood pressure in the office.

## 2022-03-09 ENCOUNTER — Telehealth: Payer: Self-pay | Admitting: Family Medicine

## 2022-03-09 ENCOUNTER — Ambulatory Visit (INDEPENDENT_AMBULATORY_CARE_PROVIDER_SITE_OTHER): Payer: 59 | Admitting: Family Medicine

## 2022-03-09 ENCOUNTER — Encounter: Payer: Self-pay | Admitting: Family Medicine

## 2022-03-09 VITALS — BP 136/68 | HR 84 | Temp 97.8°F | Ht 62.0 in | Wt 173.0 lb

## 2022-03-09 DIAGNOSIS — I152 Hypertension secondary to endocrine disorders: Secondary | ICD-10-CM | POA: Diagnosis not present

## 2022-03-09 DIAGNOSIS — E1129 Type 2 diabetes mellitus with other diabetic kidney complication: Secondary | ICD-10-CM

## 2022-03-09 DIAGNOSIS — E1159 Type 2 diabetes mellitus with other circulatory complications: Secondary | ICD-10-CM | POA: Diagnosis not present

## 2022-03-09 DIAGNOSIS — J329 Chronic sinusitis, unspecified: Secondary | ICD-10-CM

## 2022-03-09 DIAGNOSIS — R809 Proteinuria, unspecified: Secondary | ICD-10-CM

## 2022-03-09 LAB — BASIC METABOLIC PANEL
BUN: 13 mg/dL (ref 6–23)
CO2: 28 mEq/L (ref 19–32)
Calcium: 9.4 mg/dL (ref 8.4–10.5)
Chloride: 99 mEq/L (ref 96–112)
Creatinine, Ser: 1.29 mg/dL — ABNORMAL HIGH (ref 0.40–1.20)
GFR: 37.96 mL/min — ABNORMAL LOW (ref >60.00)
Glucose, Bld: 173 mg/dL — ABNORMAL HIGH (ref 70–99)
Potassium: 3.1 mEq/L — ABNORMAL LOW (ref 3.5–5.1)
Sodium: 139 mEq/L (ref 135–145)

## 2022-03-09 NOTE — Telephone Encounter (Signed)
Spoke with patient, stated ENT call her today and  told her appt wont be till march. Mucinex will not help for her sinuses  Please advise

## 2022-03-09 NOTE — Progress Notes (Signed)
   Casey [name] is a 87 y.o. female who presents today for an office visit.  Assessment/Plan:  Chronic Problems Addressed Today: Hypertension associated with diabetes ([place]) Blood pressure at goal today off of lisinopril.  She has been taking HCTZ 12.5 mg daily and amlodipine 10 mg daily.  Will hold off on restarting lisinopril at this point.  Recheck labs due to recent hypokalemia.  She will continue to monitor at home.  Advised patient to bring all of her medications into her next office visit.  Type 2 diabetes mellitus with microalbuminuria, without long-term current use of insulin (HCC) Last A1c 7.3.  We will recheck in a couple of months.  Continue current regimen metformin 500 mg daily and Jardiance 10 mg daily.  Sinusitis, chronic Has referral to ENT pending.  Still has quite a bit of issues.  Recommended Mucinex.     Subjective:  HPI:  Patient here today for follow-up. We last saw her a few weeks ago.  At that time we checked labs which showed AKI.  We had her stop her lisinopril and come back for recheck.  Her creatinine had returned to baseline however she did have a small drop in potassium.  She is here today to follow-up on blood pressure and repeat labs.  She has done well at home.  No acute concerns today.        Objective:  Physical Exam: BP 136/68   Pulse 84   Temp 97.8 F (36.6 C) (Temporal)   Ht 5' 2"$  (1.575 m)   Wt 173 lb (78.5 kg)   SpO2 99%   BMI 31.64 kg/m   Gen: No acute distress, resting comfortably CV: Regular rate and rhythm with no murmurs appreciated Pulm: Normal work of breathing, clear to auscultation bilaterally with no crackles, wheezes, or rhonchi Neuro: Grossly normal, moves all extremities Psych: Normal affect and thought content      Dywane Peruski M. Jerline Pain, MD 03/09/2022 10:10 AM

## 2022-03-09 NOTE — Assessment & Plan Note (Signed)
Last A1c 7.3.  We will recheck in a couple of months.  Continue current regimen metformin 500 mg daily and Jardiance 10 mg daily.

## 2022-03-09 NOTE — Telephone Encounter (Signed)
Patient states her Pharmacy has not received a RX for an antibiotic for sinus infection.  Requests RX for antibiotic be sent to  [facility] [place], [place] [address] AT [place] Phone: [phone]  Fax: [phone]

## 2022-03-09 NOTE — Assessment & Plan Note (Signed)
Blood pressure at goal today off of lisinopril.  She has been taking HCTZ 12.5 mg daily and amlodipine 10 mg daily.  Will hold off on restarting lisinopril at this point.  Recheck labs due to recent hypokalemia.  She will continue to monitor at home.  Advised patient to bring all of her medications into her next office visit.

## 2022-03-09 NOTE — Patient Instructions (Signed)
It was very nice to see you today!  Please stay off of your lisinopril.  Will check blood work today.  You can try taking Mucinex for your congestion.  Please come back in 2 months.  Come back sooner if needed.  Take care, Dr Jerline Pain  PLEASE NOTE:  If you had any lab tests, please let us know if you have not heard back within a few days. You may see your results on mychart before we have a chance to review them but we will give you a call once they are reviewed by [name].   If we ordered any referrals today, please let us know if you have not heard from their office within the next week.   If you had any urgent prescriptions sent in today, please check with the pharmacy within an hour of our visit to make sure the prescription was transmitted appropriately.   Please try these tips to maintain a healthy lifestyle:  Eat at least 3 REAL meals and 1-2 snacks per day.  Aim for no more than 5 hours between eating.  If you eat breakfast, please do so within one hour of getting up.   Each meal should contain half fruits/vegetables, one quarter protein, and one quarter carbs (no bigger than a computer mouse)  Cut down on sweet beverages. This includes juice, soda, and sweet tea.   Drink at least 1 glass of water with each meal and aim for at least 8 glasses per day  Exercise at least 150 minutes every week.

## 2022-03-09 NOTE — Assessment & Plan Note (Signed)
Has referral to ENT pending.  Still has quite a bit of issues.  Recommended Mucinex.

## 2022-03-10 NOTE — Telephone Encounter (Signed)
She has had multiple rounds of antibiotics already that have not helped. She can try the mucinex for 5-7 days.   Ok to send in a zpack but she needs to try the mucinex first.  Algis Greenhouse. Jerline Pain, MD 03/10/2022 3:24 PM

## 2022-03-11 ENCOUNTER — Other Ambulatory Visit: Payer: Self-pay

## 2022-03-11 MED ORDER — AZITHROMYCIN 250 MG PO TABS: ORAL_TABLET | ORAL | 0 refills | Status: AC

## 2022-03-11 NOTE — Telephone Encounter (Signed)
Zpak sent to pharmacy. Patient states no relief with mucinex

## 2022-03-12 NOTE — Progress Notes (Signed)
Please inform patient of the following:  Her potassium is still trending low.  This is also probably due to a medication side effect.  Recommend that she stop her HCTZ as well.  The only blood pressure medication she should be taking is amlodipine 10 mg daily.  Would like for her to come back early next week to recheck blood pressure and also recheck labs.

## 2022-03-13 ENCOUNTER — Telehealth: Payer: Self-pay | Admitting: Family Medicine

## 2022-03-13 NOTE — Telephone Encounter (Signed)
Patient requests to be called to be given lab results

## 2022-03-17 NOTE — Telephone Encounter (Signed)
See results note. 

## 2022-03-19 ENCOUNTER — Other Ambulatory Visit: Payer: Self-pay | Admitting: Family Medicine

## 2022-03-19 DIAGNOSIS — I10 Essential (primary) hypertension: Secondary | ICD-10-CM

## 2022-03-19 NOTE — Telephone Encounter (Signed)
Last OV: 03/09/22 Next OV: 03/23/22 Med: furosemide (LASIX) 40 MG tablet  Pharmacy: [facility]

## 2022-03-20 ENCOUNTER — Telehealth: Payer: Self-pay | Admitting: Family Medicine

## 2022-03-20 ENCOUNTER — Other Ambulatory Visit: Payer: Self-pay | Admitting: *Deleted

## 2022-03-20 DIAGNOSIS — I10 Essential (primary) hypertension: Secondary | ICD-10-CM

## 2022-03-20 DIAGNOSIS — M170 Bilateral primary osteoarthritis of knee: Secondary | ICD-10-CM

## 2022-03-20 MED ORDER — FUROSEMIDE 40 MG PO TABS: 40.0000 mg | ORAL_TABLET | Freq: Every day | ORAL | 1 refills | Status: DC

## 2022-03-20 NOTE — Telephone Encounter (Signed)
Last refills by Dr  Maximiano Coss, NP  On 08/19/2021

## 2022-03-20 NOTE — Telephone Encounter (Signed)
  LAST APPOINTMENT DATE:  03/09/22  NEXT APPOINTMENT DATE: 03/23/22  MEDICATION:  HYDROcodone-acetaminophen (NORCO/VICODIN) 5-325 MG tablet   Is the patient out of medication? Yes  PHARMACY:  [facility] [phone] - [place], [address] AT [place] Phone: [phone]  Fax: [phone]      Let patient know to contact pharmacy at the end of the day to make sure medication is ready.  Please notify patient to allow 48-72 hours to process

## 2022-03-23 ENCOUNTER — Encounter: Payer: Self-pay | Admitting: Family Medicine

## 2022-03-23 ENCOUNTER — Ambulatory Visit (INDEPENDENT_AMBULATORY_CARE_PROVIDER_SITE_OTHER): Payer: 59 | Admitting: Family Medicine

## 2022-03-23 VITALS — BP 137/67 | HR 80 | Temp 97.1°F | Ht 62.0 in | Wt 166.2 lb

## 2022-03-23 DIAGNOSIS — M170 Bilateral primary osteoarthritis of knee: Secondary | ICD-10-CM

## 2022-03-23 DIAGNOSIS — I152 Hypertension secondary to endocrine disorders: Secondary | ICD-10-CM | POA: Diagnosis not present

## 2022-03-23 DIAGNOSIS — G8929 Other chronic pain: Secondary | ICD-10-CM

## 2022-03-23 DIAGNOSIS — E1159 Type 2 diabetes mellitus with other circulatory complications: Secondary | ICD-10-CM

## 2022-03-23 DIAGNOSIS — F321 Major depressive disorder, single episode, moderate: Secondary | ICD-10-CM | POA: Diagnosis not present

## 2022-03-23 DIAGNOSIS — K279 Peptic ulcer, site unspecified, unspecified as acute or chronic, without hemorrhage or perforation: Secondary | ICD-10-CM

## 2022-03-23 DIAGNOSIS — F5104 Psychophysiologic insomnia: Secondary | ICD-10-CM | POA: Diagnosis not present

## 2022-03-23 LAB — BASIC METABOLIC PANEL
BUN: 17 mg/dL (ref 6–23)
CO2: 24 mEq/L (ref 19–32)
Calcium: 10.1 mg/dL (ref 8.4–10.5)
Chloride: 101 mEq/L (ref 96–112)
Creatinine, Ser: 1.18 mg/dL (ref 0.40–1.20)
GFR: 42.24 mL/min — ABNORMAL LOW (ref >60.00)
Glucose, Bld: 154 mg/dL — ABNORMAL HIGH (ref 70–99)
Potassium: 3.3 mEq/L — ABNORMAL LOW (ref 3.5–5.1)
Sodium: 139 mEq/L (ref 135–145)

## 2022-03-23 MED ORDER — MIRTAZAPINE 7.5 MG PO TABS: 7.5000 mg | ORAL_TABLET | Freq: Every day | ORAL | 3 refills | Status: DC

## 2022-03-23 MED ORDER — MECLIZINE HCL 25 MG PO TABS: 25.0000 mg | ORAL_TABLET | Freq: Three times a day (TID) | ORAL | 3 refills | Status: DC | PRN

## 2022-03-23 MED ORDER — HYDROCODONE-ACETAMINOPHEN 5-325 MG PO TABS: 1.0000 | ORAL_TABLET | Freq: Three times a day (TID) | ORAL | 0 refills | Status: DC | PRN

## 2022-03-23 MED ORDER — QUETIAPINE FUMARATE ER 50 MG PO TB24: 100.0000 mg | ORAL_TABLET | Freq: Every day | ORAL | 3 refills | Status: DC

## 2022-03-23 NOTE — Assessment & Plan Note (Signed)
Symptoms are not controlled and she has stopped taking her Lexapro and Wellbutrin.  Discussed with patient that she needs to follow-up with psychiatry and gave contact information today.  Looks like a referral was authorized and appointment is waiting to be scheduled.  She thinks that she may have an appointment in April for this however she is not sure.  She will call to double check later today.  We did discuss treatment options.  Start mirtazapine 7.5 mg nightly.  Hopefully this should help some with her appetite and insomnia as well.  She can follow-up in a couple weeks however again strongly encouraged patient that further management should come from her psychiatrist.

## 2022-03-23 NOTE — Assessment & Plan Note (Signed)
Patient brought medications in today.  She is currently on amlodipine 10 mg daily and lisinopril 40 mg daily.  Blood pressure is at goal today.  Will recheck labs.

## 2022-03-23 NOTE — Assessment & Plan Note (Signed)
Her Norco was refilled today.  She has been on this chronically and has tolerated well.  Database without any red flags.  Medication help with her ability stay functional and with pain management.  No significant side effects.  She will follow-up in 3 months for medication check.

## 2022-03-23 NOTE — Telephone Encounter (Signed)
Refilled at today's visit.  Casey [name]. Jerline Pain, MD 03/23/2022 10:51 AM

## 2022-03-23 NOTE — Patient Instructions (Addendum)
It was very nice to see you today!  We will start mirtazapine. This should help with your mood.  We can double up the dose in a week or 2 depending on how you do with the starting dose.   We will check blood work today.  Please come back in a couple months to recheck your A1c.  Please call the psychiatrist to schedule an appointment at [phone].   Take care, Dr Jerline Pain  PLEASE NOTE:  If you had any lab tests, please let us know if you have not heard back within a few days. You may see your results on mychart before we have a chance to review them but we will give you a call once they are reviewed by [name].   If we ordered any referrals today, please let us know if you have not heard from their office within the next week.   If you had any urgent prescriptions sent in today, please check with the pharmacy within an hour of our visit to make sure the prescription was transmitted appropriately.   Please try these tips to maintain a healthy lifestyle:  Eat at least 3 REAL meals and 1-2 snacks per day.  Aim for no more than 5 hours between eating.  If you eat breakfast, please do so within one hour of getting up.   Each meal should contain half fruits/vegetables, one quarter protein, and one quarter carbs (no bigger than a computer mouse)  Cut down on sweet beverages. This includes juice, soda, and sweet tea.   Drink at least 1 glass of water with each meal and aim for at least 8 glasses per day  Exercise at least 150 minutes every week.

## 2022-03-23 NOTE — Assessment & Plan Note (Signed)
On Seroquel 100 mg nightly.  Will be adding on mirtazapine as above.  Will defer further management to psychiatry.

## 2022-03-23 NOTE — Assessment & Plan Note (Signed)
Stable on Protonix 40 mg daily.  Will refill today.

## 2022-03-23 NOTE — Progress Notes (Signed)
Casey [name] is a 87 y.o. female who presents today for an office visit.  Assessment/Plan:  New/Acute Problems: Hypokalemia Likely medication side effect.  Recheck metabolic panel today.  Chronic Problems Addressed Today: Depression, major, single episode, moderate ([place]) Symptoms are not controlled and she has stopped taking her Lexapro and Wellbutrin.  Discussed with patient that she needs to follow-up with psychiatry and gave contact information today.  Looks like a referral was authorized and appointment is waiting to be scheduled.  She thinks that she may have an appointment in April for this however she is not sure.  She will call to double check later today.  We did discuss treatment options.  Start mirtazapine 7.5 mg nightly.  Hopefully this should help some with her appetite and insomnia as well.  She can follow-up in a couple weeks however again strongly encouraged patient that further management should come from her psychiatrist.  Hypertension associated with diabetes [facility]) Patient brought medications in today.  She is currently on amlodipine 10 mg daily and lisinopril 40 mg daily.  Blood pressure is at goal today.  Will recheck labs.  Insomnia On Seroquel 100 mg nightly.  Will be adding on mirtazapine as above.  Will defer further management to psychiatry.  PUD (peptic ulcer disease) Stable on Protonix 40 mg daily.  Will refill today.  Chronic pain Her Norco was refilled today.  She has been on this chronically and has tolerated well.  Database without any red flags.  Medication help with her ability stay functional and with pain management.  No significant side effects.  She will follow-up in 3 months for medication check.     Subjective:  HPI:  See A/p for status of chronic conditions.  Patient is here today for follow-up.  She was last seen here 2 weeks ago for hypertension follow-up.Prior to that visit we had asked her to stay off of lisinopril due to worsening  creatinine.  Labs at that time showed hypokalemia potassium 3.1.  Advised her to continue taking only amlodipine 10 mg daily.  She is here today to recheck blood work and blood pressure.  Patient brought her medications in today.  She actually never stopped taking lisinopril and has been taking this in addition of amlodipine.  She has stopped her HCTZ over the last couple of weeks.  She has been doing well with this blood pressure combination.  Her main concern today is worsening depression. This has been getting worse over the last month or so. Feeling down and depressed. No appetite. Poor sleeping. No SI or HI.  She has tried several medications in the past which she did not feel like were very effective.  When she established with [name] several months ago she was on Wellbutrin, Lexapro, and buspirone.  She has stopped taking all these medications that she did not feel like they were particularly effective.  We referred her to see psychiatry a couple months ago however she does not think that she has heard back from them.        Objective:  Physical Exam: BP 137/67   Pulse 80   Temp (!) 97.1 F (36.2 C) (Temporal)   Ht '5\' 2"'$  (1.575 m)   Wt 166 lb 3.2 oz (75.4 kg)   SpO2 98%   BMI 30.40 kg/m   Gen: No acute distress, resting comfortably CV: Regular rate and rhythm with no murmurs appreciated Pulm: Normal work of breathing, clear to auscultation bilaterally with no crackles, wheezes, or  rhonchi Neuro: Grossly normal, moves all extremities Psych: Normal affect and thought content      Kam Kushnir M. Jerline Pain, MD 03/23/2022 7:53 AM

## 2022-03-25 NOTE — Progress Notes (Signed)
Please inform patient of the following:  Her potassium levels are a little bit low but stable compared to previous. Do not need to make any changes to her treatment plan at this time.  We can recheck at her next office visit.

## 2022-04-12 ENCOUNTER — Encounter (HOSPITAL_COMMUNITY): Payer: Self-pay | Admitting: Emergency Medicine

## 2022-04-12 ENCOUNTER — Emergency Department (HOSPITAL_COMMUNITY): Payer: 59

## 2022-04-12 ENCOUNTER — Other Ambulatory Visit: Payer: Self-pay

## 2022-04-12 ENCOUNTER — Emergency Department (HOSPITAL_COMMUNITY)
Admission: EM | Admit: 2022-04-12 | Discharge: 2022-04-12 | Disposition: A | Discharge status: Home / Self Care | Payer: 59 | Attending: Emergency Medicine | Admitting: Emergency Medicine

## 2022-04-12 DIAGNOSIS — Z7984 Long term (current) use of oral hypoglycemic drugs: Secondary | ICD-10-CM | POA: Insufficient documentation

## 2022-04-12 DIAGNOSIS — E119 Type 2 diabetes mellitus without complications: Secondary | ICD-10-CM | POA: Diagnosis not present

## 2022-04-12 DIAGNOSIS — E876 Hypokalemia: Secondary | ICD-10-CM | POA: Insufficient documentation

## 2022-04-12 DIAGNOSIS — I10 Essential (primary) hypertension: Secondary | ICD-10-CM | POA: Diagnosis not present

## 2022-04-12 DIAGNOSIS — K279 Peptic ulcer, site unspecified, unspecified as acute or chronic, without hemorrhage or perforation: Secondary | ICD-10-CM

## 2022-04-12 DIAGNOSIS — K2950 Unspecified chronic gastritis without bleeding: Secondary | ICD-10-CM | POA: Diagnosis not present

## 2022-04-12 DIAGNOSIS — Z79899 Other long term (current) drug therapy: Secondary | ICD-10-CM | POA: Insufficient documentation

## 2022-04-12 DIAGNOSIS — R1013 Epigastric pain: Secondary | ICD-10-CM | POA: Diagnosis present

## 2022-04-12 DIAGNOSIS — Z7982 Long term (current) use of aspirin: Secondary | ICD-10-CM | POA: Diagnosis not present

## 2022-04-12 LAB — CBC
HCT: 43.9 % (ref 36.0–46.0)
Hemoglobin: 14.7 g/dL (ref 12.0–15.0)
MCH: 28.4 pg (ref 26.0–34.0)
MCHC: 33.5 g/dL (ref 30.0–36.0)
MCV: 84.7 fL (ref 80.0–100.0)
Platelets: 357 10*3/uL (ref 150–400)
RBC: 5.18 MIL/uL — ABNORMAL HIGH (ref 3.87–5.11)
RDW: 15.9 % — ABNORMAL HIGH (ref 11.5–15.5)
WBC: 9.4 10*3/uL (ref 4.0–10.5)
nRBC: 0 % (ref 0.0–0.2)

## 2022-04-12 LAB — COMPREHENSIVE METABOLIC PANEL
ALT: 15 U/L (ref 0–44)
AST: 28 U/L (ref 15–41)
Albumin: 4.7 g/dL (ref 3.5–5.0)
Alkaline Phosphatase: 87 U/L (ref 38–126)
Anion gap: 14 (ref 5–15)
BUN: 10 mg/dL (ref 8–23)
CO2: 26 mmol/L (ref 22–32)
Calcium: 10.4 mg/dL — ABNORMAL HIGH (ref 8.9–10.3)
Chloride: 96 mmol/L — ABNORMAL LOW (ref 98–111)
Creatinine, Ser: 1.09 mg/dL — ABNORMAL HIGH (ref 0.44–1.00)
GFR, Estimated: 50 mL/min — ABNORMAL LOW (ref >60)
Glucose, Bld: 195 mg/dL — ABNORMAL HIGH (ref 70–99)
Potassium: 2.9 mmol/L — ABNORMAL LOW (ref 3.5–5.1)
Sodium: 136 mmol/L (ref 135–145)
Total Bilirubin: 0.5 mg/dL (ref 0.3–1.2)
Total Protein: 8.2 g/dL — ABNORMAL HIGH (ref 6.5–8.1)

## 2022-04-12 LAB — TROPONIN I (HIGH SENSITIVITY)
Troponin I (High Sensitivity): <2 ng/L (ref <18)
Troponin I (High Sensitivity): 3 ng/L (ref <18)

## 2022-04-12 LAB — LIPASE, BLOOD: Lipase: 32 U/L (ref 11–51)

## 2022-04-12 MED ORDER — POTASSIUM CHLORIDE CRYS ER 20 MEQ PO TBCR
20.0000 meq | EXTENDED_RELEASE_TABLET | Freq: Once | ORAL | Status: AC
  Administered 2022-04-12: 20 meq via ORAL
  Filled 2022-04-12: qty 1

## 2022-04-12 MED ORDER — ALUM & MAG HYDROXIDE-SIMETH 200-200-20 MG/5ML PO SUSP
30.0000 mL | Freq: Once | ORAL | Status: AC
  Administered 2022-04-12: 30 mL via ORAL
  Filled 2022-04-12: qty 30

## 2022-04-12 MED ORDER — SUCRALFATE 1 GM/10ML PO SUSP: 1.0000 g | Freq: Three times a day (TID) | ORAL | 0 refills | Status: DC

## 2022-04-12 MED ORDER — POTASSIUM CHLORIDE CRYS ER 20 MEQ PO TBCR
40.0000 meq | EXTENDED_RELEASE_TABLET | Freq: Once | ORAL | Status: AC
  Administered 2022-04-12: 40 meq via ORAL
  Filled 2022-04-12: qty 2

## 2022-04-12 MED ORDER — SUCRALFATE 1 G PO TABS
1.0000 g | ORAL_TABLET | Freq: Once | ORAL | Status: AC
  Administered 2022-04-12: 1 g via ORAL
  Filled 2022-04-12: qty 1

## 2022-04-12 MED ORDER — LIDOCAINE VISCOUS HCL 2 % MT SOLN
15.0000 mL | Freq: Once | OROMUCOSAL | Status: AC
  Administered 2022-04-12: 15 mL via ORAL
  Filled 2022-04-12: qty 15

## 2022-04-12 NOTE — ED Triage Notes (Signed)
Patient from home with complaints of central pain the the chest describes it as burning, feels like its heartburn or indegestion- has never experienced this before. States the pain is middle chest back radiates to the L back, neck and left arm. States this has been ongoing for the last few days but got worse last night and she couldn't even sleep.

## 2022-04-12 NOTE — Discharge Instructions (Addendum)
Please read the attached eating guide I have sent home in your discharge paperwork which may help improve your stomach pain.  I have ordered a medication for you to take before meals and before bedtime which helps to coat your stomach and esophagus like a Band-Aid and decrease the amount of pain that you are having.  It is very important that you follow-up with your gastroenterologist.  Get help right away if: You have sudden, sharp, or persistent pain in your abdomen. You have bloody or dark black, tarry stools. You vomit blood or material that looks like coffee grounds. You become light-headed or you feel faint. You become weak. You become sweaty or clammy. These symptoms may be an emergency. Get help right away. Call 911. Do not wait to see if the symptoms will go away. Do not drive yourself to the hospital.

## 2022-04-12 NOTE — ED Provider Notes (Signed)
[facility] Provider Note   CSN: [id number] Arrival date & time: 04/12/22  [id number]     History {Add pertinent medical, surgical, social history, OB history to HPI:1} Chief Complaint  Patient presents with   Chest Pain    CAILANI ADAMIK is a 87 y.o. female Who presents emergency department chief complaint of epigastric abdominal pain, chest pain.  She has a past medical history of type 2 diabetes, hypertension, dyslipidemia, peptic ulcer disease.  Patient reports that she has had longstanding heartburn and epigastric abdominal pain which is worse for the past 2 days.  She states that she feels waterbrash and burning pain in her retrosternal area.  She also complains of pain around the diaphragmatic region.  Patient is also having pain into her chest left shoulder and left neck.  She denies any known cardiac disease.  He has had nausea without vomiting, shortness of breath or diaphoresis.  Patient reports that this feels the same as her regular heartburn however nothing seems to be making it any better today.   Chest Pain      Home Medications Prior to Admission medications   Medication Sig Start Date End Date Taking? Authorizing Provider  amLODipine (NORVASC) 10 MG tablet Take 1 tablet (10 mg total) by mouth daily. 02/04/22   Vivi Barrack, MD  aspirin 81 MG chewable tablet Chew 1 tablet (81 mg total) by mouth 2 (two) times daily. 02/11/22   [place] Eric J, DO  atorvastatin (LIPITOR) 10 MG tablet Take 1 tablet (10 mg total) by mouth daily. 08/19/21   Maximiano Coss, NP  azelastine (ASTELIN) 0.1 % nasal spray INSTILL 1 SPRAY INTO BOTH NOSTRILS TWICE DAILY 08/19/21   Maximiano Coss, NP  azelastine (OPTIVAR) 0.05 % ophthalmic solution Place 1 drop into both eyes 2 (two) times daily.    [provider]  Carboxymethylcellul-Glycerin (REFRESH OPTIVE) 1-0.9 % GEL Place 1 drop into both eyes daily.    [provider]  cyanocobalamin  (VITAMIN B12) 500 MCG tablet     [provider]  donepezil (ARICEPT) 10 MG tablet TAKE 1 TABLET(5 MG) BY MOUTH AT BEDTIME 08/19/21   Maximiano Coss, NP  empagliflozin (JARDIANCE) 10 MG TABS tablet TAKE 1 TABLET(10 MG) BY MOUTH DAILY BEFORE BREAKFAST 01/29/22   Vivi Barrack, MD  HYDROcodone-acetaminophen (NORCO/VICODIN) 5-325 MG tablet Take 1 tablet by mouth every 8 (eight) hours as needed for moderate pain. For chronic pain 03/23/22   Vivi Barrack, MD  hydrOXYzine (ATARAX) 10 MG tablet Take 1-2 tablets (10-20 mg total) by mouth 3 (three) times daily as needed. 02/12/22   Vivi Barrack, MD  lisinopril (ZESTRIL) 40 MG tablet Take 40 mg by mouth daily.    [provider]  meclizine (ANTIVERT) 25 MG tablet Take 1 tablet (25 mg total) by mouth 3 (three) times daily as needed for dizziness. 03/23/22   Vivi Barrack, MD  mirtazapine (REMERON) 7.5 MG tablet Take 1 tablet (7.5 mg total) by mouth at bedtime. 03/23/22   Vivi Barrack, MD  pantoprazole (PROTONIX) 40 MG tablet Take 1 tablet (40 mg total) by mouth 2 (two) times daily for 28 days, THEN 1 tablet (40 mg total) daily. 02/07/22 06/04/22  [place] Donnamarie Poag, DO  QUEtiapine (SEROQUEL XR) 50 MG TB24 24 hr tablet Take 2 tablets (100 mg total) by mouth at bedtime. 03/23/22   Vivi Barrack, MD      Allergies    Patient  has no known allergies.    Review of Systems   Review of Systems  Cardiovascular:  Positive for chest pain.    Physical Exam Updated Vital Signs BP (!) 151/69   Pulse 67   Temp 98.9 F (37.2 C) (Oral)   Resp 15   Ht 5\' 2"  (1.575 m)   Wt 63.5 kg   SpO2 98%   BMI 25.61 kg/m  Physical Exam Vitals and nursing note reviewed.  Constitutional:      General: She is not in acute distress.    Appearance: She is well-developed. She is not diaphoretic.  HENT:     Head: Normocephalic and atraumatic.     Right Ear: External ear normal.     Left Ear: External ear normal.     Nose: Nose normal.     Mouth/Throat:      Mouth: Mucous membranes are moist.  Eyes:     General: No scleral icterus.    Conjunctiva/sclera: Conjunctivae normal.  Cardiovascular:     Rate and Rhythm: Normal rate and regular rhythm.     Heart sounds: Normal heart sounds. No murmur heard.    No friction rub. No gallop.  Pulmonary:     Effort: Pulmonary effort is normal. No respiratory distress.     Breath sounds: Normal breath sounds.  Abdominal:     General: Bowel sounds are normal. There is no distension.     Palpations: Abdomen is soft. There is no mass.     Tenderness: There is no abdominal tenderness. There is no guarding.     Hernia: No hernia is present.  Musculoskeletal:     Cervical back: Normal range of motion.  Skin:    General: Skin is warm and dry.  Neurological:     Mental Status: She is alert and oriented to person, place, and time.  Psychiatric:        Behavior: Behavior normal.     ED Results / Procedures / Treatments   Labs (all labs ordered are listed, but only abnormal results are displayed) Labs Reviewed  CBC - Abnormal; Notable for the following components:      Result Value   RBC 5.18 (*)    RDW 15.9 (*)    All other components within normal limits  COMPREHENSIVE METABOLIC PANEL - Abnormal; Notable for the following components:   Potassium 2.9 (*)    Chloride 96 (*)    Glucose, Bld 195 (*)    Creatinine, Ser 1.09 (*)    Calcium 10.4 (*)    Total Protein 8.2 (*)    GFR, Estimated 50 (*)    All other components within normal limits  LIPASE, BLOOD  TROPONIN I (HIGH SENSITIVITY)    EKG None  Radiology DG Chest 2 View  Result Date: 04/12/2022 CLINICAL DATA:  Chest pain. EXAM: CHEST - 2 VIEW COMPARISON:  January 12, 2022. FINDINGS: The heart size and mediastinal contours are within normal limits. Both lungs are clear. The visualized skeletal structures are unremarkable. IMPRESSION: No active cardiopulmonary disease. Electronically Signed   By: Marijo Conception M.D.   On: 04/12/2022 09:45     Procedures Procedures  {Document cardiac monitor, telemetry assessment procedure when appropriate:1}  Medications Ordered in ED Medications  alum & mag hydroxide-simeth (MAALOX/MYLANTA) 200-200-20 MG/5ML suspension 30 mL (has no administration in time range)    And  lidocaine (XYLOCAINE) 2 % viscous mouth solution 15 mL (has no administration in time range)  potassium chloride SA (KLOR-CON M) CR  tablet 40 mEq (has no administration in time range)    ED Course/ Medical Decision Making/ A&P Clinical Course as of 04/12/22 1012  Sun Apr 12, 2022  1011 Potassium(!): 2.9 [AH]  1011 Troponin I (High Sensitivity): <2 [AH]    Clinical Course User Index [AH] Margarita Mail, PA-C   {   Click here for ABCD2, HEART and other calculatorsREFRESH Note before signing :1}                          Medical Decision Making Amount and/or Complexity of Data Reviewed Labs: ordered. Decision-making details documented in ED Course. Radiology: ordered.  Risk OTC drugs. Prescription drug management.   ***  {Document critical care time when appropriate:1} {Document review of labs and clinical decision tools ie heart score, Chads2Vasc2 etc:1}  {Document your independent review of radiology images, and any outside records:1} {Document your discussion with family members, caretakers, and with consultants:1} {Document social determinants of health affecting pt's care:1} {Document your decision making why or why not admission, treatments were needed:1} Final Clinical Impression(s) / ED Diagnoses Final diagnoses:  None    Rx / DC Orders ED Discharge Orders     None

## 2022-04-12 NOTE — ED Notes (Signed)
Patient transported to X-ray 

## 2022-04-14 ENCOUNTER — Telehealth: Payer: Self-pay

## 2022-04-14 ENCOUNTER — Ambulatory Visit: Payer: Self-pay | Admitting: *Deleted

## 2022-04-14 NOTE — Transitions of Care (Post Inpatient/ED Visit) (Unsigned)
   04/14/2022  Name: Casey [name] MRN: [id number] DOB: July 27, 1935  Today's TOC FU Call Status:    Transition Care Management Follow-up Telephone Call Date of Discharge: 04/12/22 Discharge Facility: [facility]) Type of Discharge: Emergency Department Reason for ED Visit: Other: How have you been since you were released from the hospital?: Same Any questions or concerns?: No  Items Reviewed: Did you receive and understand the discharge instructions provided?: Yes Medications obtained and verified?: Yes (Medications Reviewed) Any new allergies since your discharge?: No Dietary orders reviewed?: NA Do you have support at home?: Yes  Home Care and Equipment/Supplies: [place] Ordered?: NA Any new equipment or medical supplies ordered?: NA  Functional Questionnaire: Do you need assistance with bathing/showering or dressing?: No Do you need assistance with meal preparation?: No Do you need assistance with eating?: No Do you have difficulty maintaining continence: No Do you need assistance with getting out of bed/getting out of a chair/moving?: No Do you have difficulty managing or taking your medications?: No  Follow up appointments reviewed: PCP Follow-up appointment confirmed?: Yes Date of PCP follow-up appointment?: 04/23/22 Follow-up Provider: Dimas Chyle, MD [facility] Follow-up appointment confirmed?: Yes Do you need transportation to your follow-up appointment?: No Do you understand care options if your condition(s) worsen?: Yes-patient verbalized understanding    [place], [place]

## 2022-04-14 NOTE — Chronic Care Management (AMB) (Signed)
   04/14/2022  Jeralyn Ruths [birth year] [id number]   Enrollment status changed to previously enrolled.  Jacqlyn Larsen RNC, BSN RN Case Manager [place] at [facility] [phone]

## 2022-04-21 ENCOUNTER — Encounter: Payer: Self-pay | Admitting: Neurology

## 2022-04-21 ENCOUNTER — Ambulatory Visit (INDEPENDENT_AMBULATORY_CARE_PROVIDER_SITE_OTHER): Payer: 59 | Admitting: Neurology

## 2022-04-21 VITALS — BP 142/65 | HR 56 | Ht 64.0 in | Wt 155.0 lb

## 2022-04-21 DIAGNOSIS — R42 Dizziness and giddiness: Secondary | ICD-10-CM

## 2022-04-21 DIAGNOSIS — R252 Cramp and spasm: Secondary | ICD-10-CM | POA: Diagnosis not present

## 2022-04-21 DIAGNOSIS — E876 Hypokalemia: Secondary | ICD-10-CM | POA: Insufficient documentation

## 2022-04-21 MED ORDER — RIVASTIGMINE 4.6 MG/24HR TD PT24: 4.6000 mg | MEDICATED_PATCH | Freq: Every day | TRANSDERMAL | 12 refills | Status: DC

## 2022-04-21 MED ORDER — MIRTAZAPINE 7.5 MG PO TABS: 7.5000 mg | ORAL_TABLET | Freq: Every day | ORAL | 3 refills | Status: DC

## 2022-04-21 NOTE — Progress Notes (Signed)
Provider:  Larey Seat, MD  Primary Care Physician:  Vivi Barrack, [place] [address]     Referring Provider: Jenetta Downer, [place],  [place] [address]          Chief Complaint according to patient   Patient presents with:     New problem  referral by ENT for established Patient (Initial Visit)           HISTORY OF PRESENT ILLNESS:  Casey [name] is a 87 y.o. female patient who is here for New problem on 04/21/2022 for  referral by ENT  for Headache syndrome, dizziness and to rule out vestibular migraines. The ENT referral note stated the patient had BPV and facial pain. It also stated cerumen impaction and sinusitis, BBPV and was supposed to  have a head CT done- which she didn't. Tenderness over the maxillary sinu and frontal sinu was documented - and she finally underwent a CT sinus study which was benign> see below    This patient has a history of major depression and is treated for  dementia / she was seen on 04-12-2022 in the ED with  chronic gastritis,  hypokalemia  and the recommendation was to see PCP next and also GI . Her potassium level was 2.9 !!!! There have been ongoing dyseasthesias and balance problems,    Chief concern according to patient :      03/06/21:Casey [name] is an 87 year old female patient who has followed by Dr Jannifer Franklin and  was seen with a history of memory disturbance.  She returns today for follow-up.  The patient currently lives at home alone.  She is able to complete all ADLs independently.  She has a niece that lives close by that helps her if needed.  She continues to cook without difficulty.  She does not operate a motor vehicle.  She manages her own medications, appointments and finances.  She is currently on Aricept 5 mg at bedtime.  Reports that she is tolerating this well.   HISTORY Casey [name] is a 87 y.o. female and is seen here on 09-12-2020 in a RV for progressive memory loss.   She reports she feels it is harder to recall names and appointments.  She often finds herself sleeping in a choir, not sure for how long. She reports nervously shaking, trembling. She also state sh didn't sleep at all- and when asked since when she stated she filled trazodone on 09-09-2020. - she has felt it isn't working. No outside information.  She had an orthopedic surgery with Dr Lyla Glassing , total right knee. 07-24-2020. She is still on hydrocodone. Prescribed 07-25-2020.     Review of Systems: Out of a complete 14 system review, the patient complains of only the following symptoms, and all other reviewed systems are negative.:   Memory loss Chronic depression.  Lives alone, but shouldn't.  She needs a helper in her home .     Reports diarrhea, reports weakness, dizziness in lightheadedness, palpitations, and leg cramps. Abnormal e lytes, gait is unsteady, she feels dizzy. No vertigo. .  Social History   Socioeconomic History   Marital status: Single    Spouse name: Not on file   Number of children: 1   Years of education: 12   Highest education level: High school graduate  Occupational History   Not on file  Tobacco Use   Smoking status: Never  Smokeless tobacco: Never  Vaping Use   Vaping Use: Never used  Substance and Sexual Activity   Alcohol use: No   Drug use: No   Sexual activity: Not Currently  Other Topics Concern   Not on file  Social History Narrative   She tries to stay active, but over the last year (20 19-20 20), she has been very limited by her right knee pain.   Is able to go grocery shopping.   Social Determinants of Health   Financial Resource Strain: Low Risk  (12/25/2016)   Overall Financial Resource Strain (CARDIA)    Difficulty of Paying Living Expenses: Not very hard  Food Insecurity: No Food Insecurity (02/05/2022)   Hunger Vital Sign    Worried About Running Out of Food in the Last Year: Never true    Ran Out of Food in the Last Year: Never true   Transportation Needs: No Transportation Needs (12/25/2016)   PRAPARE - Hydrologist (Medical): No    Lack of Transportation (Non-Medical): No  Physical Activity: Inactive (12/25/2016)   Exercise Vital Sign    Days of Exercise per Week: 0 days    Minutes of Exercise per Session: 0 min  Stress: No Stress Concern Present (12/25/2016)   [place]    Feeling of Stress : Only a little  Social Connections: Moderately Isolated (12/25/2016)   Social Connection and Isolation Panel [NHANES]    Frequency of Communication with Friends and Family: Once a week    Frequency of Social Gatherings with Friends and Family: Once a week    Attends Religious Services: More than 4 times per year    Active Member of [employer] or Organizations: No    Attends Music therapist: Never    Marital Status: Never married    Family History  Problem Relation Age of Onset   Coronary artery disease Mother 35   Cancer Father 56   Heart disease Father    Colon cancer Other    Diabetes Other    Coronary artery disease Sister    Diabetes type II Sister    Cancer Sister    Coronary artery disease Brother    Kidney disease Brother    Coronary artery disease Brother    Breast cancer Neg Hx     Past Medical History:  Diagnosis Date   ALLERGIC RHINITIS    Allergy    ANXIETY    ARM PAIN, RIGHT    BENIGN POSITIONAL VERTIGO    Carcinoma in situ of colon    CONSTIPATION    CYSTOCELE WITH INCOMPLETE UTERINE PROLAPSE    Diabetes mellitus without complication ([facility])    ESOPHAGEAL STRICTURE    FATIGUE    FLANK PAIN, LEFT    Headache(784.0)    HIP PAIN, LEFT    HYPERCHOLESTEROLEMIA    HYPERGLYCEMIA    HYPERTENSION, BENIGN ESSENTIAL 03/03/2007   LEG EDEMA, BILATERAL    Microscopic hematuria    OBESITY    OSTEOARTHRITIS, KNEE    OSTEOPOROSIS    Peripheral neuropathy, idiopathic    Problems with swallowing and  mastication    SINUSITIS, RECURRENT    SPRAIN&STRAIN OTH SPEC SITES SHOULDER&UPPER ARM     Past Surgical History:  Procedure Laterality Date   BIOPSY  02/06/2022   Procedure: BIOPSY;  Surgeon: Otis Brace, MD;  Location: [facility];  Service: Gastroenterology;;   COLONOSCOPY     ESOPHAGOGASTRODUODENOSCOPY (EGD) WITH PROPOFOL  N/A 02/06/2022   Procedure: ESOPHAGOGASTRODUODENOSCOPY (EGD) WITH PROPOFOL;  Surgeon: Otis Brace, MD;  Location: [facility];  Service: Gastroenterology;  Laterality: N/A;   KNEE ARTHROPLASTY Right 07/24/2020   Procedure: COMPUTER ASSISTED TOTAL KNEE ARTHROPLASTY;  Surgeon: Rod Can, MD;  Location: [facility];  Service: Orthopedics;  Laterality: Right;   LEFT HEART CATHETERIZATION WITH CORONARY ANGIOGRAM N/A 12/02/2010   Procedure: LEFT HEART CATHETERIZATION WITH CORONARY ANGIOGRAM;  Surgeon: Peter M Martinique, MD;  Location: [facility] CATH LAB;  Service: Cardiovascular;:: NORMAL CORONARY ARTERIES - NORMAL LV FUNCTION & EDP.     TRANSTHORACIC ECHOCARDIOGRAM  11/2010   Normal EF 60-65%. No RWMA.  Mild LAD dilation c/w Gr 1 DD.  -- PRETTY NORMAL ECHO     Current Outpatient Medications on File Prior to Visit  Medication Sig Dispense Refill   amLODipine (NORVASC) 10 MG tablet Take 1 tablet (10 mg total) by mouth daily. 90 tablet 1   aspirin 81 MG chewable tablet Chew 1 tablet (81 mg total) by mouth 2 (two) times daily.     atorvastatin (LIPITOR) 10 MG tablet Take 1 tablet (10 mg total) by mouth daily. 90 tablet 3   azelastine (ASTELIN) 0.1 % nasal spray INSTILL 1 SPRAY INTO BOTH NOSTRILS TWICE DAILY 30 mL 12   azelastine (OPTIVAR) 0.05 % ophthalmic solution Place 1 drop into both eyes 2 (two) times daily.     Carboxymethylcellul-Glycerin (REFRESH OPTIVE) 1-0.9 % GEL Place 1 drop into both eyes daily.     cyanocobalamin (VITAMIN B12) 500 MCG tablet      donepezil (ARICEPT) 10 MG tablet TAKE 1 TABLET(5 MG) BY MOUTH AT BEDTIME 90 tablet 5   empagliflozin (JARDIANCE)  10 MG TABS tablet TAKE 1 TABLET(10 MG) BY MOUTH DAILY BEFORE BREAKFAST 90 tablet 0   HYDROcodone-acetaminophen (NORCO/VICODIN) 5-325 MG tablet Take 1 tablet by mouth every 8 (eight) hours as needed for moderate pain. For chronic pain 90 tablet 0   hydrOXYzine (ATARAX) 10 MG tablet Take 1-2 tablets (10-20 mg total) by mouth 3 (three) times daily as needed. 180 tablet 1   lisinopril (ZESTRIL) 40 MG tablet Take 40 mg by mouth daily.     meclizine (ANTIVERT) 25 MG tablet Take 1 tablet (25 mg total) by mouth 3 (three) times daily as needed for dizziness. 90 tablet 3   mirtazapine (REMERON) 7.5 MG tablet Take 1 tablet (7.5 mg total) by mouth at bedtime. 90 tablet 3   pantoprazole (PROTONIX) 40 MG tablet Take 1 tablet (40 mg total) by mouth 2 (two) times daily for 28 days, THEN 1 tablet (40 mg total) daily. 146 tablet 0   QUEtiapine (SEROQUEL XR) 50 MG TB24 24 hr tablet Take 2 tablets (100 mg total) by mouth at bedtime. 180 tablet 3   sucralfate (CARAFATE) 1 GM/10ML suspension Take 10 mLs (1 g total) by mouth 4 (four) times daily -  with meals and at bedtime. 420 mL 0   No current facility-administered medications on file prior to visit.      DIAGNOSTIC DATA (LABS, IMAGING, TESTING) - I reviewed patient records, labs, notes, testing and imaging myself where available.    1 HM Topic          Component Ref Range & Units 9 d ago (04/12/22) 4 wk ago (03/23/22) 1 mo ago (03/09/22) 1 mo ago (02/27/22) 2 mo ago (02/12/22) 2 mo ago (02/07/22) 2 mo ago (02/06/22)  Sodium 135 - 145 mmol/L 136 139 R 139 R 141 R 142 R 142  142  Potassium 3.5 - 5.1 mmol/L 2.9 Low  3.3 Low  R 3.1 Low  R 3.3 Low  R 3.7 R 4.1 3.3 Low   Chloride 98 - 111 mmol/L 96 Low  101 R 99 R 100 R 102 R 109 105  CO2 22 - 32 mmol/L 26 24 R 28 R 27 R 27 R 25 27  Glucose, Bld 70 - 99 mg/dL 195 High  154 High  173 High  183 High  149 High  107 High  CM 128 High  CM  Comment: Glucose reference range applies only to samples taken after fasting  for at least 8 hours.  BUN 8 - 23 mg/dL 10 17 R 13 R 9 R 23 R 15 10  Creatinine, Ser 0.44 - 1.00 mg/dL 1.09 High  1.18 R 1.29 High  R 1.07 R 2.09 High  R 0.84 0.91  Calcium 8.9 - 10.3 mg/dL 10.4 High  10.1 R 9.4 R 9.7 R 10.1 R 9.3 9.3  Total Protein 6.5 - 8.1 g/dL 8.2 High     7.7 R    Albumin 3.5 - 5.0 g/dL 4.7    4.6 R    AST 15 - 41 U/L 28    13 R    ALT 0 - 44 U/L 15    12 R    Alkaline Phosphatase 38 - 126 U/L 87    94 R    Total Bilirubin 0.3 - 1.2 mg/dL 0.5    0.4 R    GFR, Estimated >60 mL/min 50 Low      >60 CM >60 CM  Comment: (NOTE) Calculated using the CKD-EPI Creatinine Equation (2021)  Anion gap 5 - 15 14     8  CM 1                Component Ref Range & Units 9 d ago (04/12/22) 2 mo ago (02/12/22) 2 mo ago (02/07/22) 2 mo ago (02/06/22) 2 mo ago (02/06/22) 2 mo ago (02/05/22) 2 mo ago (02/05/22)  WBC 4.0 - 10.5 K/uL 9.4 8.2 8.4 7.1 8.2 9.2 9.4  RBC 3.87 - 5.11 MIL/uL 5.18 High  4.60 R 3.90 4.34 4.06 4.23 4.16  Hemoglobin 12.0 - 15.0 g/dL 14.7 13.1 11.0 Low  12.1 11.4 Low  11.8 Low  11.5 Low   HCT 36.0 - 46.0 % 43.9 39.4 33.1 Low  36.9 34.7 Low  35.9 Low  35.5 Low   MCV 80.0 - 100.0 fL 84.7 85.6 R 84.9 85.0 85.5 84.9 85.3  MCH 26.0 - 34.0 pg 28.4  28.2 27.9 28.1 27.9 27.6  MCHC 30.0 - 36.0 g/dL 33.5 33.2 33.2 32.8 32.9 32.9 32.4  RDW 11.5 - 15.5 % 15.9 High  15.6 High  15.4 15.4 15.3 15.3 15.3  Platelets 150 - 400 K/uL 357 337.0 R            COMPARISON:  CT head November 22, 2021.   FINDINGS:  Paranasal sinuses:   Frontal: Mild mucosal thickening.  Patent frontoethmoidal recesses.   Ethmoid: Mild scattered ethmoid air cell mucosal thickening.   Maxillary: Mild to moderate inferior right maxillary sinus mucosal  thickening. Otherwise, clear maxillary sinuses.   Sphenoid: Normally aerated. Patent sphenoethmoidal recesses.   Right ostiomeatal unit: Patent.   Left ostiomeatal unit: Patent.   Nasal passages: Patent. Rightward nasal septal  deviation with bony  spur.   Anatomy:  Sellar sphenoid pneumatization pattern.   Other: Orbits and intracranial compartment are unremarkable. Visible  mastoid air cells are normally aerated.   IMPRESSION:  Mild paranasal sinus disease, detailed above and greatest in the  inferior right maxillary sinus.    Electronically Signed    By: Margaretha Sheffield M.D.    On: 03/12/2022 09:14   Lab Results  Component Value Date   WBC 9.4 04/12/2022   HGB 14.7 04/12/2022   HCT 43.9 04/12/2022   MCV 84.7 04/12/2022   PLT 357 04/12/2022      Component Value Date/Time   NA 136 04/12/2022 0858   NA 139 01/03/2020 1528   K 2.9 (L) 04/12/2022 0858   CL 96 (L) 04/12/2022 0858   CO2 26 04/12/2022 0858   GLUCOSE 195 (H) 04/12/2022 0858   BUN 10 04/12/2022 0858   BUN 12 01/03/2020 1528   CREATININE 1.09 (H) 04/12/2022 0858   CREATININE 0.95 (H) 07/02/2015 1154   CALCIUM 10.4 (H) 04/12/2022 0858   PROT 8.2 (H) 04/12/2022 0858   PROT 7.4 01/03/2020 1528   ALBUMIN 4.7 04/12/2022 0858   ALBUMIN 4.8 (H) 01/03/2020 1528   AST 28 04/12/2022 0858   ALT 15 04/12/2022 0858   ALKPHOS 87 04/12/2022 0858   BILITOT 0.5 04/12/2022 0858   BILITOT 0.2 01/03/2020 1528   GFRNONAA 50 (L) 04/12/2022 0858   GFRNONAA 58 (L) 07/02/2015 1154   GFRAA 66 01/03/2020 1528   GFRAA 66 07/02/2015 1154   Lab Results  Component Value Date   CHOL 213 (H) 08/19/2021   HDL 95.90 08/19/2021   LDLCALC 102 (H) 08/19/2021   TRIG 74.0 08/19/2021   CHOLHDL 2 08/19/2021   Lab Results  Component Value Date   HGBA1C 7.3 (A) 12/26/2021   Lab Results  Component Value Date   VITAMINB12 >2000 (H) 07/17/2019   Lab Results  Component Value Date   TSH 1.20 08/19/2021    PHYSICAL EXAM: rapid weight loss !    Wt Readings from Last 3 Encounters:  04/12/22 140 lb (63.5 kg)  03/23/22 166 lb 3.2 oz (75.4 kg)  03/09/22 173 lb (78.5 kg)     Ht Readings from Last 3 Encounters:  04/12/22 5\' 2"  (1.575 m)  03/23/22 5\' 2"   (1.575 m)  03/09/22 5\' 2"  (1.575 m)      General: The patient is awake, alert and appears not in acute distress. The patient is not well groomed, clothing is stained. Head: Normocephalic, atraumatic. Neck is supple.  Mallampati; 2  neck circumference: 14.5 " Skin:  With evidence of right ankle edema, scar is well healed.  Trunk: BMI is now 33/ kg /m2  from 38 kg/m2  -the patient is seated.  Uses a cane today but seems unsteady-  Neurologic exam : The patient is awake and alert, oriented to place and time.   Memory subjective  described as impaired.   There is a limited  attention span & concentration ability.  Speech is  less fluent with dysarthria, dysphonia - but there is some aphasia.  Mood and affect are anxious.    Cranial nerves: Pupils are equal and briskly reactive to light. Funduscopic exam deferred. Extraocular movements  in vertical and horizontal planes intact and without nystagmus.  Visual fields by finger perimetry are intact. Hearing to finger rub intact.   Facial sensation intact to fine touch.  She does not report facial pain, no loss of sensation, no hyperpathia both sides of the face feel similar/ symmetrically which is not a feature of atypical facial pain - there is no pain.  Facial motor strength is symmetric / smile is symmetric- and tongue deviated to the left ( mildly)  While the uvula moved in midline. Tongue protrusion into either cheek is normal.  Shoulder shrug is bilateral limited, she reports muscle soreness and joint stiffness.  Motor exam:    symmetric muscle bulk .  Weakness of right  hip flexion, hip adduction and retroflexion of the right foot, weakness -  foot drop pronounced on the right  She is very tender to muscle palpation - MYALGIA>  and joints are tender too. No cogwheeling.   Coordination: Rapid alternating movements in the fingers/hands were slowed  Finger-to-nose maneuver normal on the right, but arm was not fully extended- slower with  dysmetria on the left-  A tremor was not noted .   Gait and station: Patient walks with assistance and leans towards the right side. Small steps, reduced arm swing and turning with 7 steps, shuffling gait , slowed. But not stooped, no cogwheeling , no resting tremor in either hand.   Deep tendon reflexes: in the upper and lower extremities are symmetric and intact.     ASSESSMENT AND PLAN 87 y.o. year old female  here with:   Severe weight loss , chronic  diarrhea and critically low potassium, lightheaded when rising out of bed. Chest burning,  sinus pressure and not sure why she is here, not can I see the symptoms her ENT physician described.   0)  all year vertigo, dizziness- but worse since she felt sinus pressure, and that  historically aggravates her more in spring and autumn, but she denies any seasonality.    1) abnormal electrolytes: trembling, anxiety, weakness- could all be related to hypokalemia. She has reported leg cramping. This can be related to low potassium as well.  Diarrhea may be cause of low K and she has  had a rapid severe weight loss.   2)Dementia/ NCD- neurocognitive disorder , mild - to moderate by MMSE 22/ 30 points. There are risk factors for ministrokes, vascular dementia and also  pseudodementia with depression and anxiety.GERIATRIC DEPRESSION SCORE ENDORSED AT 10 out 15 points !!!  I will give the patient no new tests.  She can stay on REMERON for sleep, but likely needs help to have the Medication every night in her pillbox. She also will change from aricept 10 mg po to an excelon patch 4.5 with the hope of helping her GI system.   She needs to be worked up by Museum/gallery curator or FP.  She also needs help at home, needs meal prep, and assistance in housekeeping, Her clothing was stained today.   I consider her at risk of falling and also cognitively impaired.  Her diarrhea could be fostered by aricept as can bradycardia or QT prolongation be related. I  d/c  aricept 10 mg po today and instead started an excelon patch for daily use.    I plan to follow up one a year through our NP for memory testing.   I would like to thank Vivi Barrack, MD and Jenetta Downer, [place],  [place] [address] for allowing me to meet with and to take care of this pleasant patient.   CC: I will share my notes with PCP .  After spending a total time of  45  minutes face to face and additional time for physical and neurologic examination, review of laboratory studies,  personal review of imaging studies, reports and results of other testing and  review of referral information / records as far as provided in visit,   Electronically signed by: Larey Seat, MD 04/21/2022 9:47 AM  [facility] Board certified by The [employer] of Sleep Medicine and Diplomate of the [employer] of Sleep Medicine. Board certified In Neurology through the [employer], Fellow of the [employer] of Neurology. Medical Director of [facility].

## 2022-04-21 NOTE — Patient Instructions (Addendum)
ASSESSMENT AND PLAN 87 y.o. year old female  here with:     Severe weight loss , chronic  diarrhea and critically low potassium, lightheaded when rising out of bed. Chest burning,  sinus pressure and not sure why she is here, not can I see the symptoms her ENT physician described.    0)  all year vertigo, dizziness- but worse since she felt sinus pressure, and that  historically aggravates her more in spring and autumn, but she denies any seasonality.    1) abnormal electrolytes: trembling, anxiety, weakness- could all be related to hypokalemia. She has reported leg cramping. This can be related to low potassium as well.  Diarrhea may be cause of low K and she has  had a rapid severe weight loss.    2)Dementia/ NCD- neurocognitive disorder , mild - to moderate by MMSE 22/ 30 points. There are risk factors for ministrokes, vascular dementia and also  pseudodementia with depression and anxiety.GERIATRIC DEPRESSION SCORE ENDORSED AT 10 out 15 points !!!   I will give the patient no new medication or tests.  She needs to be worked up by Museum/gallery curator or FP.  She also needs help at home, needs meal prep, and assistance in housekeeping, Her clothing was stained today.   I consider her at risk of falling and also cognitively impaired.  Her diarrhea could be fostered by aricept as can bradycardia or QT prolongation be related. I  d/c aricept 10 mg po today and instead started an excelon patch for daily use.   The patient denies migrainous headaches.      I plan to follow up one a year through our NP for memory testing.    I would like to thank Vivi Barrack, MD and Jenetta Downer, [place],  [place] [id number] for allowing me to meet with and to take care of this pleasant patient.    CC: I will share my notes with PCP .

## 2022-04-23 ENCOUNTER — Ambulatory Visit: Payer: 59 | Admitting: Family Medicine

## 2022-04-23 ENCOUNTER — Emergency Department (HOSPITAL_COMMUNITY): Payer: 59

## 2022-04-23 ENCOUNTER — Encounter (HOSPITAL_COMMUNITY): Payer: Self-pay

## 2022-04-23 ENCOUNTER — Other Ambulatory Visit: Payer: Self-pay

## 2022-04-23 ENCOUNTER — Emergency Department (HOSPITAL_COMMUNITY)
Admission: EM | Admit: 2022-04-23 | Discharge: 2022-04-23 | Disposition: A | Discharge status: Home / Self Care | Payer: 59 | Attending: Emergency Medicine | Admitting: Emergency Medicine

## 2022-04-23 DIAGNOSIS — R1013 Epigastric pain: Secondary | ICD-10-CM | POA: Diagnosis not present

## 2022-04-23 DIAGNOSIS — E119 Type 2 diabetes mellitus without complications: Secondary | ICD-10-CM | POA: Insufficient documentation

## 2022-04-23 DIAGNOSIS — R1010 Upper abdominal pain, unspecified: Secondary | ICD-10-CM

## 2022-04-23 DIAGNOSIS — E876 Hypokalemia: Secondary | ICD-10-CM | POA: Diagnosis not present

## 2022-04-23 DIAGNOSIS — I10 Essential (primary) hypertension: Secondary | ICD-10-CM | POA: Diagnosis not present

## 2022-04-23 LAB — URINALYSIS, ROUTINE W REFLEX MICROSCOPIC
Bilirubin Urine: NEGATIVE
Glucose, UA: >=500 mg/dL — AB
Ketones, ur: 20 mg/dL — AB
Leukocytes,Ua: NEGATIVE
Nitrite: NEGATIVE
Protein, ur: 30 mg/dL — AB
Specific Gravity, Urine: 1.027 (ref 1.005–1.030)
pH: 5 (ref 5.0–8.0)

## 2022-04-23 LAB — CBC WITH DIFFERENTIAL/PLATELET
Abs Immature Granulocytes: 0.03 10*3/uL (ref 0.00–0.07)
Basophils Absolute: 0.1 10*3/uL (ref 0.0–0.1)
Basophils Relative: 1 %
Eosinophils Absolute: 0 10*3/uL (ref 0.0–0.5)
Eosinophils Relative: 0 %
HCT: 41 % (ref 36.0–46.0)
Hemoglobin: 13.2 g/dL (ref 12.0–15.0)
Immature Granulocytes: 0 %
Lymphocytes Relative: 20 %
Lymphs Abs: 1.8 10*3/uL (ref 0.7–4.0)
MCH: 27.9 pg (ref 26.0–34.0)
MCHC: 32.2 g/dL (ref 30.0–36.0)
MCV: 86.7 fL (ref 80.0–100.0)
Monocytes Absolute: 0.5 10*3/uL (ref 0.1–1.0)
Monocytes Relative: 5 %
Neutro Abs: 6.8 10*3/uL (ref 1.7–7.7)
Neutrophils Relative %: 74 %
Platelets: 279 10*3/uL (ref 150–400)
RBC: 4.73 MIL/uL (ref 3.87–5.11)
RDW: 15.4 % (ref 11.5–15.5)
WBC: 9.2 10*3/uL (ref 4.0–10.5)
nRBC: 0 % (ref 0.0–0.2)

## 2022-04-23 LAB — COMPREHENSIVE METABOLIC PANEL
ALT: 18 U/L (ref 0–44)
AST: 26 U/L (ref 15–41)
Albumin: 4.4 g/dL (ref 3.5–5.0)
Alkaline Phosphatase: 82 U/L (ref 38–126)
Anion gap: 18 — ABNORMAL HIGH (ref 5–15)
BUN: 21 mg/dL (ref 8–23)
CO2: 26 mmol/L (ref 22–32)
Calcium: 10.1 mg/dL (ref 8.9–10.3)
Chloride: 100 mmol/L (ref 98–111)
Creatinine, Ser: 1.32 mg/dL — ABNORMAL HIGH (ref 0.44–1.00)
GFR, Estimated: 40 mL/min — ABNORMAL LOW (ref >60)
Glucose, Bld: 166 mg/dL — ABNORMAL HIGH (ref 70–99)
Potassium: 3.2 mmol/L — ABNORMAL LOW (ref 3.5–5.1)
Sodium: 144 mmol/L (ref 135–145)
Total Bilirubin: 0.6 mg/dL (ref 0.3–1.2)
Total Protein: 7.7 g/dL (ref 6.5–8.1)

## 2022-04-23 LAB — TROPONIN I (HIGH SENSITIVITY)
Troponin I (High Sensitivity): 4 ng/L (ref <18)
Troponin I (High Sensitivity): 3 ng/L (ref <18)

## 2022-04-23 LAB — LIPASE, BLOOD: Lipase: 26 U/L (ref 11–51)

## 2022-04-23 MED ORDER — FENTANYL CITRATE PF 50 MCG/ML IJ SOSY
50.0000 ug | PREFILLED_SYRINGE | Freq: Once | INTRAMUSCULAR | Status: AC
  Administered 2022-04-23: 50 ug via INTRAVENOUS
  Filled 2022-04-23: qty 1

## 2022-04-23 MED ORDER — POLYETHYLENE GLYCOL 3350 17 G PO PACK
17.0000 g | PACK | Freq: Once | ORAL | Status: AC
  Administered 2022-04-23: 17 g via ORAL
  Filled 2022-04-23: qty 1

## 2022-04-23 MED ORDER — IOHEXOL 350 MG/ML SOLN
60.0000 mL | Freq: Once | INTRAVENOUS | Status: AC | PRN
  Administered 2022-04-23: 60 mL via INTRAVENOUS

## 2022-04-23 MED ORDER — SODIUM CHLORIDE 0.9 % IV BOLUS
500.0000 mL | Freq: Once | INTRAVENOUS | Status: AC
  Administered 2022-04-23: 500 mL via INTRAVENOUS

## 2022-04-23 MED ORDER — SUCRALFATE 1 GM/10ML PO SUSP: 1.0000 g | Freq: Three times a day (TID) | ORAL | Status: DC

## 2022-04-23 MED ORDER — ALUM & MAG HYDROXIDE-SIMETH 200-200-20 MG/5ML PO SUSP
30.0000 mL | Freq: Once | ORAL | Status: AC
  Administered 2022-04-23: 30 mL via ORAL
  Filled 2022-04-23: qty 30

## 2022-04-23 MED ORDER — FAMOTIDINE IN NACL 20-0.9 MG/50ML-% IV SOLN
20.0000 mg | Freq: Once | INTRAVENOUS | Status: AC
  Administered 2022-04-23: 20 mg via INTRAVENOUS
  Filled 2022-04-23: qty 50

## 2022-04-23 NOTE — ED Triage Notes (Signed)
Pt to ED via EMS from home with c/o left side abd pain, and epigastric pain. Pt states she was seen 10 days ago for current symptoms was diagnosed with ulcers and gastritis. Pt reports having an follow up GI appointment today but pain was unbearable. Denies n/v/d. Pt also reports antacids are not reliving symptoms. Pt states pt is 10/10.

## 2022-04-23 NOTE — ED Provider Notes (Signed)
[place] EMERGENCY DEPARTMENT AT [facility] Provider Note   CSN: [id number] Arrival date & time: 04/23/22  6962     History  No chief complaint on file.   Casey [name] is a 87 y.o. female.  Casey [name] is a 87 y.o. female with hx of HTN, HLD, DM, who presents to the ED via EMS for evaluation of abdominal pain.  Patient localizes abdominal pain to the epigastric region on the left side of the abdomen.  She was seen for similar pain 1 days ago and after reassuring workup this was felt to most likely be related to peptic ulcer disease or gastritis.  Patient states that she has been taking medications but this morning pain was worse which prompted her ED visit.  She was supposed to have a follow-up appointment with her primary care provider today.  She denies vomiting or diarrhea but reports with decreased appetite due to pain.  Patient denies pain in her chest or arms, no pain radiating to the back.  No associated shortness of breath, no fevers or chills.  She denies any blood in her stool or dark black stools.  She states that antacids were not helping for pain today.  During last ED visit was prescribed Carafate but is only for she has been taking this regularly.  Denies any NSAID use.  Denies alcohol use.  No other changes to diet.  No other aggravating or relieving factors.  The history is provided by the patient.       Home Medications Prior to Admission medications   Medication Sig Start Date End Date Taking? Authorizing Provider  amLODipine (NORVASC) 10 MG tablet Take 1 tablet (10 mg total) by mouth daily. 02/04/22   Ardith Dark, MD  aspirin 81 MG chewable tablet Chew 1 tablet (81 mg total) by mouth 2 (two) times daily. 02/11/22   [place], Eric J, DO  atorvastatin (LIPITOR) 10 MG tablet Take 1 tablet (10 mg total) by mouth daily. 08/19/21   [name], NP  azelastine (ASTELIN) 0.1 % nasal spray INSTILL 1 SPRAY INTO BOTH NOSTRILS TWICE DAILY 08/19/21   [name], NP  azelastine (OPTIVAR) 0.05 % ophthalmic solution Place 1 drop into both eyes 2 (two) times daily.    [provider]  Carboxymethylcellul-Glycerin (REFRESH OPTIVE) 1-0.9 % GEL Place 1 drop into both eyes daily.    [provider]  cyanocobalamin (VITAMIN B12) 500 MCG tablet     [provider]  empagliflozin (JARDIANCE) 10 MG TABS tablet TAKE 1 TABLET(10 MG) BY MOUTH DAILY BEFORE BREAKFAST 01/29/22   Ardith Dark, MD  HYDROcodone-acetaminophen (NORCO/VICODIN) 5-325 MG tablet Take 1 tablet by mouth every 8 (eight) hours as needed for moderate pain. For chronic pain 03/23/22   Ardith Dark, MD  hydrOXYzine (ATARAX) 10 MG tablet Take 1-2 tablets (10-20 mg total) by mouth 3 (three) times daily as needed. 02/12/22   Ardith Dark, MD  lisinopril (ZESTRIL) 40 MG tablet Take 40 mg by mouth daily.    [provider]  meclizine (ANTIVERT) 25 MG tablet Take 1 tablet (25 mg total) by mouth 3 (three) times daily as needed for dizziness. 03/23/22   Ardith Dark, MD  mirtazapine (REMERON) 7.5 MG tablet Take 1 tablet (7.5 mg total) by mouth at bedtime. 04/21/22   Dohmeier, Porfirio Mylar, MD  pantoprazole (PROTONIX) 40 MG tablet Take 1 tablet (40 mg total) by mouth 2 (two) times daily for 28 days, THEN 1  tablet (40 mg total) daily. 02/07/22 06/04/22  [place], Alvira Philips, DO  QUEtiapine (SEROQUEL XR) 50 MG TB24 24 hr tablet Take 2 tablets (100 mg total) by mouth at bedtime. 03/23/22   Ardith Dark, MD  rivastigmine (EXELON) 4.6 mg/24hr Place 1 patch (4.6 mg total) onto the skin daily. 04/21/22   Dohmeier, Porfirio Mylar, MD  sucralfate (CARAFATE) 1 GM/10ML suspension Take 10 mLs (1 g total) by mouth 4 (four) times daily -  with meals and at bedtime. 04/12/22   Arthor Captain, PA-C      Allergies    Patient has no known allergies.    Review of Systems   Review of Systems  Constitutional:  Negative for chills and fever.  HENT: Negative.    Respiratory:  Negative for shortness of  breath.   Cardiovascular:  Negative for chest pain.  Gastrointestinal:  Positive for abdominal pain. Negative for blood in stool, diarrhea and vomiting.  All other systems reviewed and are negative.   Physical Exam Updated Vital Signs BP (!) 147/70   Pulse (!) 57   Temp (!) 97.4 F (36.3 C) (Oral)   Resp 16   SpO2 100%  Physical Exam Vitals and nursing note reviewed.  Constitutional:      General: She is not in acute distress.    Appearance: Normal appearance. She is well-developed. She is not ill-appearing or diaphoretic.  HENT:     Head: Normocephalic and atraumatic.     Mouth/Throat:     Mouth: Mucous membranes are moist.     Pharynx: Oropharynx is clear.  Eyes:     General:        Right eye: No discharge.        Left eye: No discharge.     Pupils: Pupils are equal, round, and reactive to light.  Cardiovascular:     Rate and Rhythm: Normal rate and regular rhythm.     Pulses: Normal pulses.     Heart sounds: Normal heart sounds.  Pulmonary:     Effort: Pulmonary effort is normal. No respiratory distress.     Breath sounds: Normal breath sounds. No wheezing or rales.     Comments: Respirations equal and unlabored, patient able to speak in full sentences, lungs clear to auscultation bilaterally  Abdominal:     General: Bowel sounds are normal. There is no distension.     Palpations: Abdomen is soft. There is no mass.     Tenderness: There is abdominal tenderness in the epigastric area and left upper quadrant. There is no guarding.     Comments: Abdomen soft, nondistended, tenderness noted in the epigastric and left upper quadrants, all other quadrants nontender, no aggravating or alleviating factors.  Musculoskeletal:        General: No deformity.     Cervical back: Neck supple.  Skin:    General: Skin is warm and dry.     Capillary Refill: Capillary refill takes less than 2 seconds.  Neurological:     Mental Status: She is alert and oriented to person, place, and  time.     Coordination: Coordination normal.     Comments: Speech is clear, able to follow commands Moves extremities without ataxia, coordination intact  Psychiatric:        Mood and Affect: Mood normal.        Behavior: Behavior normal.     ED Results / Procedures / Treatments   Labs (all labs ordered are listed, but only abnormal results are displayed)  Labs Reviewed  COMPREHENSIVE METABOLIC PANEL - Abnormal; Notable for the following components:      Result Value   Potassium 3.2 (*)    Glucose, Bld 166 (*)    Creatinine, Ser 1.32 (*)    GFR, Estimated 40 (*)    Anion gap 18 (*)    All other components within normal limits  URINALYSIS, ROUTINE W REFLEX MICROSCOPIC - Abnormal; Notable for the following components:   Glucose, UA >=500 (*)    Hgb urine dipstick SMALL (*)    Ketones, ur 20 (*)    Protein, ur 30 (*)    Bacteria, UA RARE (*)    All other components within normal limits  LIPASE, BLOOD  CBC WITH DIFFERENTIAL/PLATELET  TROPONIN I (HIGH SENSITIVITY)  TROPONIN I (HIGH SENSITIVITY)    EKG EKG Interpretation  Date/Time:  Thursday April 23 2022 09:33:12 EDT Ventricular Rate:  67 PR Interval:  162 QRS Duration: 122 QT Interval:  523 QTC Calculation: 553 R Axis:   -5 Text Interpretation: Sinus rhythm Nonspecific intraventricular conduction delay Probable anterior infarct, age indeterminate Confirmed by Alona Bene ([id number]) on 04/23/2022 9:49:28 AM  Radiology   US Abdomen Limited RUQ (LIVER/GB)  Result Date: 04/23/2022 CLINICAL DATA:  [id number] Epigastric pain 114842 EXAM: ULTRASOUND ABDOMEN LIMITED COMPARISON:  None Available. FINDINGS: The liver demonstrates normal parenchymal echogenicity and homogeneous texture without focal hepatic parenchymal lesions or intrahepatic ductal dilatation. Hepatopetal portal vein. The gallbladder demonstrates no stones, wall thickening or pericholecystic fluid. CBD measured 0.4cm. Right kidney upper pole cyst noted as an incidental  finding that was partially imaged measuring 4.5 cm. No follow up recommended for this finding, unless otherwise indicated clinically. IMPRESSION: Right kidney cyst noted incidentally. Otherwise unremarkable examination of the right upper quadrant. Electronically Signed   By: Layla Maw M.D.   On: 04/23/2022 13:53   CT Abdomen Pelvis W Contrast  Result Date: 04/23/2022 CLINICAL DATA:  Abdominal pain, acute, nonlocalized EXAM: CT ABDOMEN AND PELVIS WITH CONTRAST TECHNIQUE: Multidetector CT imaging of the abdomen and pelvis was performed using the standard protocol following bolus administration of intravenous contrast. RADIATION DOSE REDUCTION: This exam was performed according to the departmental dose-optimization program which includes automated exposure control, adjustment of the mA and/or kV according to patient size and/or use of iterative reconstruction technique. CONTRAST:  60mL OMNIPAQUE IOHEXOL 350 MG/ML SOLN COMPARISON:  02/04/2022 FINDINGS: Lower chest: No acute abnormality. Hepatobiliary: 1.1 cm cyst at the right hepatic dome. Liver is otherwise within normal limits. Suggestion of mild gallbladder wall thickening in the region of the gallbladder fundus. No hyperdense gallstone. No biliary dilatation. Pancreas: Unremarkable. No pancreatic ductal dilatation or surrounding inflammatory changes. Spleen: Normal in size without focal abnormality. Adrenals/Urinary Tract: Unremarkable adrenal glands. Peripherally calcified cyst within the interpolar region of the right kidney measures up to 3.1 cm and has mildly thickened walls, similar in appearance to the prior study. There are additional simple-appearing bilateral renal cysts which do not require follow-up imaging. Nonobstructing stones within the left kidney measure up to 5 mm in size. No hydronephrosis. Urinary bladder within normal limits for the degree of distension. Stomach/Bowel: Stomach is within normal limits. Appendix appears normal (series 3,  image 53). No evidence of bowel wall thickening, distention, or inflammatory changes. Vascular/Lymphatic: No significant vascular findings are present. No enlarged abdominal or pelvic lymph nodes. Reproductive: Uterus and bilateral adnexa are unremarkable. Other: No free fluid. No abdominopelvic fluid collection. No pneumoperitoneum. Multiple small fat containing ventral abdominal wall hernias. Musculoskeletal: No  acute or significant osseous findings. IMPRESSION: 1. No acute abdominopelvic findings. 2. Suggestion of mild gallbladder wall thickening in the region of the gallbladder fundus. No hyperdense gallstone. Right upper quadrant ultrasound could be obtained if there is clinical suspicion for cholecystitis. 3. Nonobstructing left renal stones. 4. Peripherally calcified cyst within the interpolar region of the right kidney measures up to 3.1 cm and has mildly thickened walls, similar in appearance to the prior study. This is favored to be benign. Follow-up ultrasound in 3-6 months could be considered to ensure stability. 5. Multiple small fat containing ventral abdominal wall hernias. Electronically Signed   By: Duanne Guess D.O.   On: 04/23/2022 11:55   DG Chest Port 1 View  Result Date: 04/23/2022 CLINICAL DATA:  epigastric pain EXAM: PORTABLE CHEST - 1 VIEW COMPARISON:  04/12/2022. FINDINGS: The heart size and mediastinal contours are within normal limits. Both lungs are clear. No pneumothorax or pleural effusion. There are thoracic degenerative changes. IMPRESSION: No active cardiopulmonary disease. Electronically Signed   By: Layla Maw M.D.   On: 04/23/2022 09:44    Procedures Procedures    Medications Ordered in ED Medications  sodium chloride 0.9 % bolus 500 mL (0 mLs Intravenous Stopped 04/23/22 1042)  famotidine (PEPCID) IVPB 20 mg premix (0 mg Intravenous Stopped 04/23/22 1042)  alum & mag hydroxide-simeth (MAALOX/MYLANTA) 200-200-20 MG/5ML suspension 30 mL (30 mLs Oral Given  04/23/22 0959)  fentaNYL (SUBLIMAZE) injection 50 mcg (50 mcg Intravenous Given 04/23/22 1000)  iohexol (OMNIPAQUE) 350 MG/ML injection 60 mL (60 mLs Intravenous Contrast Given 04/23/22 1140)  polyethylene glycol (MIRALAX / GLYCOLAX) packet 17 g (17 g Oral Given 04/23/22 1630)    ED Course/ Medical Decision Making/ A&P                             Medical Decision Making Amount and/or Complexity of Data Reviewed Labs: ordered. Radiology: ordered.  Risk OTC drugs. Prescription drug management.   87 y.o. female presents to the ED with complaints of epigastric and LUQ abdominal pain, this involves an extensive number of treatment options, and is a complaint that carries with it a high risk of complications and morbidity.  The differential diagnosis includes PUD, gastritis, pancreatitis, cholecystitis, perforation, bowel obstruction, ACS   On arrival pt is nontoxic, vitals WNL.   Additional history obtained from chart review. Previous records obtained and reviewed including most recent ED visit and prior GI evaluations. EGD in January w/ esophageal ulcer and gastritis with medium sized hiatal hernia  I ordered medication including fentanyl, pepcid, and maalox. Pt also expressed concern about constipation during encounter, miralax given  Lab Tests:  I Ordered, reviewed, and interpreted labs, which included: normal Hgb, and pt without melena, low suspicion for GI bleed, no leukocytosis, Cr 1.32, K 3.2, otherwise normal electrolytes and LFTs, normal lipase and troponin, UA without hematuria or signs of infection  Imaging Studies ordered:  I ordered imaging studies which included CT Abd/pel & RUQ [name], I independently visualized and interpreted imaging which showed possible gallbladder wall thickening on CT, no other acute findings, [name] ordered for further investigation without evidence of cholecystitis. Incidental renal cyst noted, PCP follow up  ED Course:   Symptoms improved, and pt tolerating PO.  Based up on symptoms and reassuring work up, continue to suspect PUD vs gastiris. Discussed continued medication management, and stress the use of carafate for meals. Pt will need GI and PCP followup.  Portions of this note were generated with Scientist, clinical (histocompatibility and immunogenetics). Dictation errors may occur despite best attempts at proofreading.         Final Clinical Impression(s) / ED Diagnoses Final diagnoses:  Pain of upper abdomen    Rx / DC Orders ED Discharge Orders     None         Legrand Rams 05/03/22 2149    Maia Plan, MD 05/07/22 1501

## 2022-04-23 NOTE — Discharge Instructions (Addendum)
Your imaging and labs today were overall reassuring.  I suspect your pain may be related to the ulcers you have.  Continue taking Protonix and using the Carafate that you were prescribed at your last ED visit to help manage symptoms.  Please call to schedule follow-up appointment with your primary care doctor and GI doctor.  Return for new or worsening pain, blood in your stool or other new or worsening symptoms.  For constipation you can use MiraLAX once daily as needed.

## 2022-04-23 NOTE — Progress Notes (Deleted)
   Casey [name] is a 87 y.o. female who presents today for an office visit.  Assessment/Plan:  New/Acute Problems: ***  Chronic Problems Addressed Today: No problem-specific Assessment & Plan notes found for this encounter.     Subjective:  HPI:  ***        Objective:  Physical Exam: There were no vitals taken for this visit.  Gen: No acute distress, resting comfortably*** CV: Regular rate and rhythm with no murmurs appreciated Pulm: Normal work of breathing, clear to auscultation bilaterally with no crackles, wheezes, or rhonchi Neuro: Grossly normal, moves all extremities Psych: Normal affect and thought content      Loni Abdon M. Jerline Pain, MD 04/23/2022 10:06 AM

## 2022-04-24 ENCOUNTER — Other Ambulatory Visit: Payer: Self-pay | Admitting: Family Medicine

## 2022-04-24 ENCOUNTER — Telehealth: Payer: Self-pay

## 2022-04-24 DIAGNOSIS — M170 Bilateral primary osteoarthritis of knee: Secondary | ICD-10-CM

## 2022-04-24 MED ORDER — HYDROCODONE-ACETAMINOPHEN 5-325 MG PO TABS: 1.0000 | ORAL_TABLET | Freq: Three times a day (TID) | ORAL | 0 refills | Status: DC | PRN

## 2022-04-24 NOTE — Transitions of Care (Post Inpatient/ED Visit) (Signed)
   04/24/2022  Name: Casey [name] MRN: [id number] DOB: 09-25-1935  Today's TOC FU Call Status: Today's TOC FU Call Status:: Unsuccessul Call (1st Attempt)  Transition Care Management Follow-up Telephone Call Date of Discharge: 04/23/22 Discharge Facility: [facility]) Type of Discharge: Emergency Department Reason for ED Visit: Other: How have you been since you were released from the hospital?: Worse Any questions or concerns?: Yes Patient Questions/Concerns:: Concerns that this is a reoccuring issue for her, but all tests have been negative. Patient Questions/Concerns Addressed: Notified Provider of Patient Questions/Concerns  Items Reviewed: Did you receive and understand the discharge instructions provided?: Yes Medications obtained and verified?: Yes (Medications Reviewed) Any new allergies since your discharge?: No Dietary orders reviewed?: NA Do you have support at home?: Yes People in Home: spouse Name of Support/Comfort Primary Source: Home Health Aide  Home Care and Equipment/Supplies: Were Home Health Services Ordered?: NA Any new equipment or medical supplies ordered?: NA  Functional Questionnaire: Do you need assistance with bathing/showering or dressing?: Yes Do you need assistance with meal preparation?: Yes Do you need assistance with eating?: Yes Do you have difficulty maintaining continence: Yes Do you need assistance with getting out of bed/getting out of a chair/moving?: Yes Do you have difficulty managing or taking your medications?: No  Follow up appointments reviewed: PCP Follow-up appointment confirmed?: Yes Date of PCP follow-up appointment?: 04/28/22 Follow-up Provider: Jacquiline Doe, MD [facility] Follow-up appointment confirmed?: Yes Do you need transportation to your follow-up appointment?: Yes Transportation Need Intervention Addressed By:: Transportation Arranged Do you understand care options if your condition(s) worsen?:  Yes-patient verbalized understanding    SIGNATURE Adela Glimpse, CMA

## 2022-04-24 NOTE — Telephone Encounter (Signed)
Last OV: 03/23/22 Next OV: 04/29/22 Medication: Hydrocodone- acetaminophen5-325 mg Pharmacy: [facility] on [address]

## 2022-04-27 ENCOUNTER — Telehealth: Payer: Self-pay | Admitting: Family Medicine

## 2022-04-27 NOTE — Telephone Encounter (Signed)
Copied from CRM [id number]. Topic: Medicare AWV >> Apr 27, 2022  9:13 AM Gwenith Spitz wrote: Reason for CRM: Called patient to schedule Medicare Annual Wellness Visit (AWV). Left message for patient to call back and schedule Medicare Annual Wellness Visit (AWV).  Last date of AWV: 10/12/2018  Please schedule an appointment at any time with Inetta Fermo, [facility]. Please schedule AWVS with Inetta Fermo, [facility].  If any questions, please contact me at [phone].  Thank you ,  Gabriel Cirri Spark M. [facility] AWV TEAM Direct Dial [phone]

## 2022-04-28 ENCOUNTER — Ambulatory Visit: Payer: 59 | Admitting: Family Medicine

## 2022-04-29 ENCOUNTER — Ambulatory Visit (INDEPENDENT_AMBULATORY_CARE_PROVIDER_SITE_OTHER): Payer: 59 | Admitting: Family Medicine

## 2022-04-29 ENCOUNTER — Encounter: Payer: Self-pay | Admitting: Family Medicine

## 2022-04-29 VITALS — BP 136/77 | HR 67 | Temp 97.1°F | Ht 64.0 in | Wt 155.0 lb

## 2022-04-29 DIAGNOSIS — E1159 Type 2 diabetes mellitus with other circulatory complications: Secondary | ICD-10-CM | POA: Diagnosis not present

## 2022-04-29 DIAGNOSIS — R5381 Other malaise: Secondary | ICD-10-CM | POA: Diagnosis not present

## 2022-04-29 DIAGNOSIS — F321 Major depressive disorder, single episode, moderate: Secondary | ICD-10-CM

## 2022-04-29 DIAGNOSIS — F0390 Unspecified dementia without behavioral disturbance: Secondary | ICD-10-CM | POA: Diagnosis not present

## 2022-04-29 DIAGNOSIS — K279 Peptic ulcer, site unspecified, unspecified as acute or chronic, without hemorrhage or perforation: Secondary | ICD-10-CM | POA: Diagnosis not present

## 2022-04-29 DIAGNOSIS — F411 Generalized anxiety disorder: Secondary | ICD-10-CM

## 2022-04-29 DIAGNOSIS — I152 Hypertension secondary to endocrine disorders: Secondary | ICD-10-CM

## 2022-04-29 LAB — COMPREHENSIVE METABOLIC PANEL
ALT: 10 U/L (ref 0–35)
AST: 13 U/L (ref 0–37)
Albumin: 4.3 g/dL (ref 3.5–5.2)
Alkaline Phosphatase: 73 U/L (ref 39–117)
BUN: 17 mg/dL (ref 6–23)
CO2: 32 mEq/L (ref 19–32)
Calcium: 10.3 mg/dL (ref 8.4–10.5)
Chloride: 96 mEq/L (ref 96–112)
Creatinine, Ser: 0.99 mg/dL (ref 0.40–1.20)
GFR: 52.1 mL/min — ABNORMAL LOW (ref >60.00)
Glucose, Bld: 146 mg/dL — ABNORMAL HIGH (ref 70–99)
Potassium: 3.1 mEq/L — ABNORMAL LOW (ref 3.5–5.1)
Sodium: 137 mEq/L (ref 135–145)
Total Bilirubin: 0.3 mg/dL (ref 0.2–1.2)
Total Protein: 6.9 g/dL (ref 6.0–8.3)

## 2022-04-29 LAB — CBC
HCT: 38.5 % (ref 36.0–46.0)
Hemoglobin: 12.7 g/dL (ref 12.0–15.0)
MCHC: 32.9 g/dL (ref 30.0–36.0)
MCV: 85.1 fl (ref 78.0–100.0)
Platelets: 278 10*3/uL (ref 150.0–400.0)
RBC: 4.53 Mil/uL (ref 3.87–5.11)
RDW: 15.3 % (ref 11.5–15.5)
WBC: 7.7 10*3/uL (ref 4.0–10.5)

## 2022-04-29 MED ORDER — HYDROXYZINE HCL 10 MG PO TABS: 10.0000 mg | ORAL_TABLET | Freq: Three times a day (TID) | ORAL | 1 refills | Status: DC | PRN

## 2022-04-29 NOTE — Assessment & Plan Note (Signed)
She is now on Remeron 7.5 mg nightly.  She is not sure if this has been effective.  She is also on Seroquel 100 mg nightly.  Will be placing referral to psychiatry again as above to help with medication management.

## 2022-04-29 NOTE — Assessment & Plan Note (Addendum)
She has not been taking hydroxyzine.  Will refill today.  She can take 20 mg 3 times daily as needed.  She was previously doing well with this.  She can continue her remeron 7.5mg  nightlyy and Seroquel 100 mg nightly.  We have referred her to psychiatry to help with medication management.  She has been on benzos in the past however due to her age and dementia we will avoid this going forward.  Apparently she has had some difficulty with schedule appointment with psychiatry and will place another referral today.  She was previously on Lexapro and Wellbutrin in the past however discontinued as she did not feel like they were effective.

## 2022-04-29 NOTE — Assessment & Plan Note (Signed)
Likely the source of her epigastric pain as above.  She is on Protonix 40 mg twice daily and Carafate.  Need to have her follow-up with GI ASAP.  She will call to schedule appointment today.  Check labs.  She is aware of reasons to return to care and seek emergent care.

## 2022-04-29 NOTE — Patient Instructions (Addendum)
It was very nice to see you today!  You need to see GI ASAP.  Please call to schedule an appointment as soon as you can.  We will check blood work today.  Please try taking the hydroxyzine as needed for anxiety.  It is important that we have you see a psychiatrist.  I will refer you to 1 today.  We will also refer you for home health.  Please come back in 1 month. Come back sooner if needed.   Take care, Dr [name]  PLEASE NOTE:  If you had any lab tests, please let us know if you have not heard back within a few days. You may see your results on mychart before we have a chance to review them but we will give you a call once they are reviewed by [name].   If we ordered any referrals today, please let us know if you have not heard from their office within the next week.   If you had any urgent prescriptions sent in today, please check with the pharmacy within an hour of our visit to make sure the prescription was transmitted appropriately.   Please try these tips to maintain a healthy lifestyle:  Eat at least 3 REAL meals and 1-2 snacks per day.  Aim for no more than 5 hours between eating.  If you eat breakfast, please do so within one hour of getting up.   Each meal should contain half fruits/vegetables, one quarter protein, and one quarter carbs (no bigger than a computer mouse)  Cut down on sweet beverages. This includes juice, soda, and sweet tea.   Drink at least 1 glass of water with each meal and aim for at least 8 glasses per day  Exercise at least 150 minutes every week.

## 2022-04-29 NOTE — Assessment & Plan Note (Signed)
Recently saw neurology who switched her Aricept to rivastigmine.  She is currently living alone and she feels like she is managing well however there is some concern about medication adherence and safety at home.  She is agreeable to home health referral.  Will place referral today.  Hopefully they will be able to help with ADLs and medication management.  She also has some underlying debility and weakness due to deconditioning and would benefit from course of physical therapy as well.

## 2022-04-29 NOTE — Assessment & Plan Note (Signed)
At goal on amlodipine 10 mg daily and lisinopril 40mg  daily.

## 2022-04-29 NOTE — Progress Notes (Signed)
Casey [name] is a 87 y.o. female who presents today for an office visit.  Assessment/Plan:  New/Acute Problems: Epigastric Abdominal Pain Likely secondary to her gastritis and peptic ulcer disease.  She has had cardiac workup in the ED a couple of times which was negative and symptoms resolved with Tums.  Discussed with patient that it is very important for her to schedule appointment with GI soon.  She is already on Carafate 4 times daily and Protonix 40 mg twice daily-do not think we can add much else onto her medication regimen at this time.  She has not had any overt signs of GI bleed though we will recheck labs today including CBC and c-Met.  Patient stated she would call to schedule appointment with GI this morning.  Hypokalemia Recheck c-Met today with labs.  This was a little better on most recent labs at 3.2.  Elevated creatinine Creatinine 1.32 at most recent ED visit.  Potentially due to dehydration.  Recheck c-Met today.  Avoid nephrotoxic medications.  Chronic Problems Addressed Today: Dementia without behavioral disturbance (HCC) Recently saw neurology who switched her Aricept to rivastigmine.  She is currently living alone and she feels like she is managing well however there is some concern about medication adherence and safety at home.  She is agreeable to home health referral.  Will place referral today.  Hopefully they will be able to help with ADLs and medication management.  She also has some underlying debility and weakness due to deconditioning and would benefit from course of physical therapy as well.  Debility We will refer for home health for PT as above.  PUD (peptic ulcer disease) Likely the source of her epigastric pain as above.  She is on Protonix 40 mg twice daily and Carafate.  Need to have her follow-up with GI ASAP.  She will call to schedule appointment today.  Check labs.  She is aware of reasons to return to care and seek emergent care.  Hypertension  associated with diabetes (HCC) At goal on amlodipine 10 mg daily and lisinopril 40mg  daily.   GAD (generalized anxiety disorder) She has not been taking hydroxyzine.  Will refill today.  She can take 20 mg 3 times daily as needed.  She was previously doing well with this.  She can continue her remeron 7.5mg  nightlyy and Seroquel 100 mg nightly.  We have referred her to psychiatry to help with medication management.  She has been on benzos in the past however due to her age and dementia we will avoid this going forward.  Apparently she has had some difficulty with schedule appointment with psychiatry and will place another referral today.  She was previously on Lexapro and Wellbutrin in the past however discontinued as she did not feel like they were effective.  Depression, major, single episode, moderate (HCC) She is now on Remeron 7.5 mg nightly.  She is not sure if this has been effective.  She is also on Seroquel 100 mg nightly.  Will be placing referral to psychiatry again as above to help with medication management.     Subjective:  HPI:  Patient here for ED follow-up.  Went to ED on 04/23/2022 with left-sided abdominal pain and epigastric pain.  She had previously went to the ED a couple weeks prior to this for similar issues and was diagnosed with gastritis.  She was referred to see GI however has not yet been able to follow-up with them.  The note from her most  recent visit is incomplete however appears that she had workup in the ED including CT scan and ultrasound.  Troponin was negative x 2. She was discharged home.   She is still having some epigastric pain. This is happening mostly at night. This last for a few minutes and goes away after taking TUMS. No nausea or vomiting. No fevers or chills. She thinks that the protonix is helping.  She has not yet seen gastroenterology.  States that she called to schedule an appointment however was told to call back.  Patient was admitted to the  hospital 3 months ago due to melena secondary to upper GI bleed due to peptic ulcer disease and she has been on Protonix 20 mg twice daily since then.  She has not yet followed up with GI.  She has not noticed any recurrence of melena or hematochezia since her hospitalization a few months ago.  She still feels like her anxiety is not adequately controlled.  We had referred her to psychiatry for this several weeks ago however she has had difficulty with scheduling appointment.  She states that the psychiatrist office that contacted her told her they no longer take her insurance and she would have to find somewhere else to go.  She has been compliant with her medications.  She has seen the neurologist since her last visit.  They switched her from Aricept to Exelon for her dementia.  Patient currently lives by herself though does have family in the area that helps assist her.  She thinks that she is able to manage most of her ADLs though does need some assistance.       Objective:  Physical Exam: BP 136/77   Pulse 67   Temp (!) 97.1 F (36.2 C) (Temporal)   Ht 5\' 4"  (1.626 m)   Wt 155 lb (70.3 kg)   SpO2 97%   BMI 26.61 kg/m   Gen: No acute distress, resting comfortably CV: Regular rate and rhythm with no murmurs appreciated Pulm: Normal work of breathing, clear to auscultation bilaterally with no crackles, wheezes, or rhonchi Neuro: Grossly normal, moves all extremities Psych: Normal affect and thought content  Time Spent: 50 minutes of total time was spent on the date of the encounter performing the following actions: chart review prior to seeing the patient including recent ED visits and hospitalizations, obtaining history, performing a medically necessary exam, counseling on the treatment plan, placing orders, and documenting in our EHR.        [name]. [name], MD 04/29/2022 10:12 AM

## 2022-04-29 NOTE — Assessment & Plan Note (Signed)
We will refer for home health for PT as above.

## 2022-05-01 ENCOUNTER — Telehealth: Payer: Self-pay | Admitting: Family Medicine

## 2022-05-01 ENCOUNTER — Ambulatory Visit (HOSPITAL_BASED_OUTPATIENT_CLINIC_OR_DEPARTMENT_OTHER)
Admission: RE | Admit: 2022-05-01 | Discharge: 2022-05-01 | Disposition: A | Discharge status: Home / Self Care | Payer: 59 | Source: Ambulatory Visit | Attending: Family Medicine | Admitting: Family Medicine

## 2022-05-01 DIAGNOSIS — N281 Cyst of kidney, acquired: Secondary | ICD-10-CM | POA: Diagnosis present

## 2022-05-01 NOTE — Telephone Encounter (Signed)
Dr. Darcus Austin Brahmbhatt's office requests a call from Dr. [name] for Patient to be seen sooner (per Patient)

## 2022-05-01 NOTE — Progress Notes (Signed)
Please inform patient of the following:  Potassium is low.  This could be due to her lisinopril.  Recommend she stop taking this completely.  I would like to see her back here next week to recheck potassium level and blood pressure.  The rest of her labs are all stable.

## 2022-05-03 ENCOUNTER — Emergency Department (HOSPITAL_COMMUNITY)
Admission: EM | Admit: 2022-05-03 | Discharge: 2022-05-04 | Disposition: A | Discharge status: Home / Self Care | Payer: 59 | Attending: Emergency Medicine | Admitting: Emergency Medicine

## 2022-05-03 ENCOUNTER — Other Ambulatory Visit: Payer: Self-pay

## 2022-05-03 ENCOUNTER — Encounter (HOSPITAL_COMMUNITY): Payer: Self-pay

## 2022-05-03 DIAGNOSIS — R110 Nausea: Secondary | ICD-10-CM | POA: Insufficient documentation

## 2022-05-03 DIAGNOSIS — R519 Headache, unspecified: Secondary | ICD-10-CM | POA: Insufficient documentation

## 2022-05-03 DIAGNOSIS — R079 Chest pain, unspecified: Secondary | ICD-10-CM | POA: Insufficient documentation

## 2022-05-03 DIAGNOSIS — E876 Hypokalemia: Secondary | ICD-10-CM | POA: Diagnosis not present

## 2022-05-03 DIAGNOSIS — Z79899 Other long term (current) drug therapy: Secondary | ICD-10-CM | POA: Insufficient documentation

## 2022-05-03 DIAGNOSIS — E119 Type 2 diabetes mellitus without complications: Secondary | ICD-10-CM | POA: Insufficient documentation

## 2022-05-03 DIAGNOSIS — Z7982 Long term (current) use of aspirin: Secondary | ICD-10-CM | POA: Diagnosis not present

## 2022-05-03 DIAGNOSIS — I10 Essential (primary) hypertension: Secondary | ICD-10-CM | POA: Insufficient documentation

## 2022-05-03 NOTE — ED Triage Notes (Signed)
Pt complaining of dizziness and a headache that started two hours ago, she said it feels like her heart is racing and ems reported bigeminy with a heart rate of 40-50

## 2022-05-03 NOTE — ED Provider Notes (Signed)
ED ECG REPORT   Date: 05/03/2022  Rate: 59  Rhythm: normal sinus rhythm and premature ventricular contractions (PVC) in pattern of bigeminy  QRS Axis: normal  Intervals: normal  ST/T Wave abnormalities: normal  Conduction Disutrbances:none  Narrative Interpretation: Ventricular bigeminy. When compared with ECG 04/24/2022, Ventricular bigeminy is now present  Old EKG Reviewed: changes noted  I have personally reviewed the EKG tracing and disagree with the computerized printout as noted.

## 2022-05-03 NOTE — ED Provider Notes (Signed)
[place] EMERGENCY DEPARTMENT AT [facility] Provider Note   CSN: [id number] Arrival date & time: 05/03/22  2318     History {Add pertinent medical, surgical, social history, OB history to HPI:1} Chief Complaint  Patient presents with   Headache    Casey [name] is a 87 y.o. female.  The history is provided by the patient.  Headache She has history of hypertension, diabetes, hyperlipidemia   Home Medications Prior to Admission medications   Medication Sig Start Date End Date Taking? Authorizing Provider  amLODipine (NORVASC) 10 MG tablet Take 1 tablet (10 mg total) by mouth daily. 02/04/22   Ardith Dark, MD  aspirin 81 MG chewable tablet Chew 1 tablet (81 mg total) by mouth 2 (two) times daily. 02/11/22   [place], Eric J, DO  atorvastatin (LIPITOR) 10 MG tablet Take 1 tablet (10 mg total) by mouth daily. 08/19/21   [name], NP  azelastine (ASTELIN) 0.1 % nasal spray INSTILL 1 SPRAY INTO BOTH NOSTRILS TWICE DAILY 08/19/21   [name], NP  azelastine (OPTIVAR) 0.05 % ophthalmic solution Place 1 drop into both eyes 2 (two) times daily.    [provider]  Carboxymethylcellul-Glycerin (REFRESH OPTIVE) 1-0.9 % GEL Place 1 drop into both eyes daily.    [provider]  cyanocobalamin (VITAMIN B12) 500 MCG tablet     [provider]  empagliflozin (JARDIANCE) 10 MG TABS tablet TAKE 1 TABLET(10 MG) BY MOUTH DAILY BEFORE BREAKFAST 01/29/22   Ardith Dark, MD  HYDROcodone-acetaminophen (NORCO/VICODIN) 5-325 MG tablet Take 1 tablet by mouth every 8 (eight) hours as needed for moderate pain. For chronic pain 04/24/22   Ardith Dark, MD  hydrOXYzine (ATARAX) 10 MG tablet Take 1-2 tablets (10-20 mg total) by mouth 3 (three) times daily as needed. 04/29/22   Ardith Dark, MD  lisinopril (ZESTRIL) 40 MG tablet Take 40 mg by mouth daily.    [provider]  meclizine (ANTIVERT) 25 MG tablet Take 1 tablet (25 mg total) by mouth 3  (three) times daily as needed for dizziness. 03/23/22   Ardith Dark, MD  mirtazapine (REMERON) 7.5 MG tablet Take 1 tablet (7.5 mg total) by mouth at bedtime. 04/21/22   Dohmeier, Porfirio Mylar, MD  pantoprazole (PROTONIX) 40 MG tablet Take 1 tablet (40 mg total) by mouth 2 (two) times daily for 28 days, THEN 1 tablet (40 mg total) daily. 02/07/22 06/04/22  [place], Alvira Philips, DO  QUEtiapine (SEROQUEL XR) 50 MG TB24 24 hr tablet Take 2 tablets (100 mg total) by mouth at bedtime. 03/23/22   Ardith Dark, MD  rivastigmine (EXELON) 4.6 mg/24hr Place 1 patch (4.6 mg total) onto the skin daily. 04/21/22   Dohmeier, Porfirio Mylar, MD  sucralfate (CARAFATE) 1 GM/10ML suspension Take 10 mLs (1 g total) by mouth 4 (four) times daily -  with meals and at bedtime. 04/12/22   Arthor Captain, PA-C      Allergies    Patient has no known allergies.    Review of Systems   Review of Systems  Neurological:  Positive for headaches.  All other systems reviewed and are negative.   Physical Exam Updated Vital Signs BP (!) 137/50 (BP Location: Right Arm)   Pulse (!) 45   Temp 98.4 F (36.9 C) (Oral)   Resp 18   Ht 5\' 2"  (1.575 m)   Wt 63.5 kg   SpO2 98%   BMI 25.61 kg/m  Physical Exam Vitals and nursing note  reviewed.   87 year old female, resting comfortably and in no acute distress. Vital signs are ***. Oxygen saturation is ***%, which is normal. Head is normocephalic and atraumatic. PERRLA, EOMI. Oropharynx is clear. Neck is nontender and supple without adenopathy or JVD. Back is nontender and there is no CVA tenderness. Lungs are clear without rales, wheezes, or rhonchi. Chest is nontender. Heart has regular rate and rhythm without murmur. Abdomen is soft, flat, nontender without masses or hepatosplenomegaly and peristalsis is normoactive. Extremities have no cyanosis or edema, full range of motion is present. Skin is warm and dry without rash. Neurologic: Mental status is normal, cranial nerves are intact, there  are no motor or sensory deficits.  ED Results / Procedures / Treatments   Labs (all labs ordered are listed, but only abnormal results are displayed) Labs Reviewed - No data to display  EKG None  Radiology No results found.  Procedures Procedures  {Document cardiac monitor, telemetry assessment procedure when appropriate:1}  Medications Ordered in ED Medications - No data to display  ED Course/ Medical Decision Making/ A&P   {   Click here for ABCD2, HEART and other calculatorsREFRESH Note before signing :1}                          Medical Decision Making  ***  {Document critical care time when appropriate:1} {Document review of labs and clinical decision tools ie heart score, Chads2Vasc2 etc:1}  {Document your independent review of radiology images, and any outside records:1} {Document your discussion with family members, caretakers, and with consultants:1} {Document social determinants of health affecting pt's care:1} {Document your decision making why or why not admission, treatments were needed:1} Final Clinical Impression(s) / ED Diagnoses Final diagnoses:  None    Rx / DC Orders ED Discharge Orders     None

## 2022-05-03 NOTE — ED Provider Notes (Incomplete)
ED ECG REPORT   Date: 05/03/2022 23:41  Rate: 59  Rhythm: normal sinus rhythm and premature ventricular contractions (PVC) in pattern of bigeminy  QRS Axis: normal  Intervals: normal  ST/T Wave abnormalities: normal  Conduction Disutrbances:none  Narrative Interpretation: Low voltage QRS. Ventricular bigeminy. When compared with ECG 04/24/2022, Ventricular bigeminy is now present, QT interval has lengthened  Old EKG Reviewed: changes noted  I have personally reviewed the EKG tracing and disagree with the computerized printout as noted.  ED ECG REPORT   Date: 05/03/2022 23:49  Rate: 60  Rhythm: normal sinus rhythm  QRS Axis: normal  Intervals: QT prolonged  ST/T Wave abnormalities: normal  Conduction Disutrbances:none  Narrative Interpretation: Low voltage QRS, prolonge QT interval. When comparetd with ECG earlier today, PVC's are no loonger present.  Old EKG Reviewed: changes noted  I have personally reviewed the EKG tracing and agree with the computerized printout as noted.

## 2022-05-04 ENCOUNTER — Emergency Department (HOSPITAL_COMMUNITY): Payer: 59

## 2022-05-04 DIAGNOSIS — R519 Headache, unspecified: Secondary | ICD-10-CM | POA: Diagnosis not present

## 2022-05-04 LAB — COMPREHENSIVE METABOLIC PANEL
ALT: 26 U/L (ref 0–44)
AST: 25 U/L (ref 15–41)
Albumin: 3.5 g/dL (ref 3.5–5.0)
Alkaline Phosphatase: 65 U/L (ref 38–126)
Anion gap: 13 (ref 5–15)
BUN: 8 mg/dL (ref 8–23)
CO2: 29 mmol/L (ref 22–32)
Calcium: 9.4 mg/dL (ref 8.9–10.3)
Chloride: 97 mmol/L — ABNORMAL LOW (ref 98–111)
Creatinine, Ser: 1.14 mg/dL — ABNORMAL HIGH (ref 0.44–1.00)
GFR, Estimated: 47 mL/min — ABNORMAL LOW (ref >60)
Glucose, Bld: 152 mg/dL — ABNORMAL HIGH (ref 70–99)
Potassium: 2.7 mmol/L — CL (ref 3.5–5.1)
Sodium: 139 mmol/L (ref 135–145)
Total Bilirubin: 0.6 mg/dL (ref 0.3–1.2)
Total Protein: 6.2 g/dL — ABNORMAL LOW (ref 6.5–8.1)

## 2022-05-04 LAB — URINALYSIS, W/ REFLEX TO CULTURE (INFECTION SUSPECTED)
Bilirubin Urine: NEGATIVE
Glucose, UA: >=500 mg/dL — AB
Hgb urine dipstick: NEGATIVE
Ketones, ur: NEGATIVE mg/dL
Leukocytes,Ua: NEGATIVE
Nitrite: NEGATIVE
Protein, ur: NEGATIVE mg/dL
Specific Gravity, Urine: 1.006 (ref 1.005–1.030)
pH: 7 (ref 5.0–8.0)

## 2022-05-04 LAB — CBC WITH DIFFERENTIAL/PLATELET
Abs Immature Granulocytes: 0.02 10*3/uL (ref 0.00–0.07)
Basophils Absolute: 0.1 10*3/uL (ref 0.0–0.1)
Basophils Relative: 1 %
Eosinophils Absolute: 0.4 10*3/uL (ref 0.0–0.5)
Eosinophils Relative: 5 %
HCT: 35.8 % — ABNORMAL LOW (ref 36.0–46.0)
Hemoglobin: 11.9 g/dL — ABNORMAL LOW (ref 12.0–15.0)
Immature Granulocytes: 0 %
Lymphocytes Relative: 50 %
Lymphs Abs: 3.5 10*3/uL (ref 0.7–4.0)
MCH: 28.3 pg (ref 26.0–34.0)
MCHC: 33.2 g/dL (ref 30.0–36.0)
MCV: 85 fL (ref 80.0–100.0)
Monocytes Absolute: 0.6 10*3/uL (ref 0.1–1.0)
Monocytes Relative: 8 %
Neutro Abs: 2.6 10*3/uL (ref 1.7–7.7)
Neutrophils Relative %: 36 %
Platelets: 242 10*3/uL (ref 150–400)
RBC: 4.21 MIL/uL (ref 3.87–5.11)
RDW: 15.1 % (ref 11.5–15.5)
WBC: 7.1 10*3/uL (ref 4.0–10.5)
nRBC: 0 % (ref 0.0–0.2)

## 2022-05-04 LAB — MAGNESIUM: Magnesium: 1.6 mg/dL — ABNORMAL LOW (ref 1.7–2.4)

## 2022-05-04 LAB — TROPONIN I (HIGH SENSITIVITY)
Troponin I (High Sensitivity): 2 ng/L (ref <18)
Troponin I (High Sensitivity): 3 ng/L (ref <18)

## 2022-05-04 LAB — LIPASE, BLOOD: Lipase: 32 U/L (ref 11–51)

## 2022-05-04 MED ORDER — MAGNESIUM SULFATE 2 GM/50ML IV SOLN
2.0000 g | Freq: Once | INTRAVENOUS | Status: AC
  Administered 2022-05-04: 2 g via INTRAVENOUS
  Filled 2022-05-04: qty 50

## 2022-05-04 MED ORDER — POTASSIUM CHLORIDE CRYS ER 20 MEQ PO TBCR: EXTENDED_RELEASE_TABLET | ORAL | 0 refills | Status: DC

## 2022-05-04 MED ORDER — SODIUM CHLORIDE 0.9 % IV BOLUS
1000.0000 mL | Freq: Once | INTRAVENOUS | Status: AC
  Administered 2022-05-04: 1000 mL via INTRAVENOUS

## 2022-05-04 MED ORDER — POTASSIUM CHLORIDE 10 MEQ/100ML IV SOLN
10.0000 meq | INTRAVENOUS | Status: AC
  Administered 2022-05-04 (×2): 10 meq via INTRAVENOUS
  Filled 2022-05-04 (×2): qty 100

## 2022-05-04 MED ORDER — POTASSIUM CHLORIDE CRYS ER 20 MEQ PO TBCR
40.0000 meq | EXTENDED_RELEASE_TABLET | Freq: Once | ORAL | Status: AC
  Administered 2022-05-04: 40 meq via ORAL
  Filled 2022-05-04: qty 2

## 2022-05-04 MED ORDER — ONDANSETRON 8 MG PO TBDP: 8.0000 mg | ORAL_TABLET | Freq: Three times a day (TID) | ORAL | 0 refills | Status: DC | PRN

## 2022-05-04 MED ORDER — ASPIRIN 81 MG PO CHEW
324.0000 mg | CHEWABLE_TABLET | Freq: Once | ORAL | Status: AC
  Administered 2022-05-04: 324 mg via ORAL
  Filled 2022-05-04: qty 4

## 2022-05-04 MED ORDER — PROCHLORPERAZINE EDISYLATE 10 MG/2ML IJ SOLN
10.0000 mg | Freq: Once | INTRAMUSCULAR | Status: AC
  Administered 2022-05-04: 10 mg via INTRAVENOUS
  Filled 2022-05-04: qty 2

## 2022-05-04 MED ORDER — POTASSIUM CHLORIDE 10 MEQ/100ML IV SOLN
10.0000 meq | Freq: Once | INTRAVENOUS | Status: AC
  Administered 2022-05-04: 10 meq via INTRAVENOUS
  Filled 2022-05-04: qty 100

## 2022-05-04 MED ORDER — SLOW-MAG 71.5-119 MG PO TBEC: 1.0000 | DELAYED_RELEASE_TABLET | Freq: Two times a day (BID) | ORAL | 0 refills | Status: DC

## 2022-05-04 NOTE — Discharge Instructions (Signed)
If your headache comes back, try putting an ice pack over the back of your head and neck.  You may also take ibuprofen and/or acetaminophen for pain.  Your potassium and magnesium were low today.  You have been given prescriptions for supplements to keep them up.  Please see your primary care provider in 1 week at which time he can check your blood levels to see if they are being adequately corrected.  Return if you have any new or concerning symptoms.

## 2022-05-05 ENCOUNTER — Telehealth: Payer: Self-pay

## 2022-05-05 NOTE — Progress Notes (Signed)
Please inform patient of the following:  The ultrasound on her kidney shows a stable mass.  It is not clear what her mass is coming from and the radiologist recommended we get further testing.  Please have her follow-up with me this week to discuss further and next steps in testing.

## 2022-05-05 NOTE — Transitions of Care (Post Inpatient/ED Visit) (Signed)
   05/05/2022  Name: BATUL DIEGO MRN: [id number] DOB: July 21, 1935  Today's TOC FU Call Status: Today's TOC FU Call Status:: Unsuccessul Call (1st Attempt) Unsuccessful Call (1st Attempt) Date: 05/05/22  Attempted to reach the patient regarding the most recent Inpatient/ED visit.  Follow Up Plan: Additional outreach attempts will be made to reach the patient to complete the Transitions of Care (Post Inpatient/ED visit) call.   Signature Adela Glimpse, CMA

## 2022-05-05 NOTE — Telephone Encounter (Signed)
Left message to return call to our office at their convenience.   Call to for lab results also

## 2022-05-05 NOTE — Telephone Encounter (Signed)
Can we call to get more info? They were supposed to see her peptic ulcer disease follow up.  Casey [name]. [name], MD 05/05/2022 8:54 AM

## 2022-05-07 ENCOUNTER — Telehealth: Payer: Self-pay

## 2022-05-07 NOTE — Telephone Encounter (Signed)
Spoke to patient, scheduled for 4/23 with PCP

## 2022-05-07 NOTE — Transitions of Care (Post Inpatient/ED Visit) (Signed)
   05/07/2022  Name: Casey [name] MRN: [id number] DOB: September 14, 1935  Today's TOC FU Call Status:    Transition Care Management Follow-up Telephone Call Date of Discharge: 05/03/22 Discharge Facility: [facility]) Type of Discharge: Emergency Department Reason for ED Visit: Neurologic How have you been since you were released from the hospital?: Same Any questions or concerns?: No  Items Reviewed: Did you receive and understand the discharge instructions provided?: Yes Medications obtained and verified?: Yes (Medications Reviewed) Any new allergies since your discharge?: No Dietary orders reviewed?: NA Do you have support at home?: Yes  Home Care and Equipment/Supplies: Were Home Health Services Ordered?: NA Any new equipment or medical supplies ordered?: NA  Functional Questionnaire: Do you need assistance with bathing/showering or dressing?: No Do you need assistance with meal preparation?: No Do you need assistance with eating?: No Do you have difficulty maintaining continence: No Do you need assistance with getting out of bed/getting out of a chair/moving?: No Do you have difficulty managing or taking your medications?: No  Follow up appointments reviewed: PCP Follow-up appointment confirmed?: Yes Date of PCP follow-up appointment?: 05/12/22 Follow-up Provider: Jacquiline Doe, MD [facility]ist [facility] Follow-up appointment confirmed?: NA Do you need transportation to your follow-up appointment?: Yes Transportation Need Intervention Addressed By:: Transportation Arranged Do you understand care options if your condition(s) worsen?: Yes-patient verbalized understanding    SIGNATURE Adela Glimpse, CMA

## 2022-05-07 NOTE — Telephone Encounter (Signed)
Returned call. Asking for a call back.

## 2022-05-07 NOTE — Telephone Encounter (Signed)
TOC completed 

## 2022-05-12 ENCOUNTER — Encounter: Payer: Self-pay | Admitting: Family Medicine

## 2022-05-12 ENCOUNTER — Ambulatory Visit (INDEPENDENT_AMBULATORY_CARE_PROVIDER_SITE_OTHER): Payer: 59 | Admitting: Family Medicine

## 2022-05-12 VITALS — BP 130/79 | HR 58 | Temp 97.7°F | Ht 62.0 in | Wt 154.2 lb

## 2022-05-12 DIAGNOSIS — E1159 Type 2 diabetes mellitus with other circulatory complications: Secondary | ICD-10-CM | POA: Diagnosis not present

## 2022-05-12 DIAGNOSIS — N1831 Chronic kidney disease, stage 3a: Secondary | ICD-10-CM

## 2022-05-12 DIAGNOSIS — N2889 Other specified disorders of kidney and ureter: Secondary | ICD-10-CM

## 2022-05-12 DIAGNOSIS — K279 Peptic ulcer, site unspecified, unspecified as acute or chronic, without hemorrhage or perforation: Secondary | ICD-10-CM

## 2022-05-12 DIAGNOSIS — F411 Generalized anxiety disorder: Secondary | ICD-10-CM | POA: Diagnosis not present

## 2022-05-12 DIAGNOSIS — R5381 Other malaise: Secondary | ICD-10-CM

## 2022-05-12 DIAGNOSIS — Z794 Long term (current) use of insulin: Secondary | ICD-10-CM

## 2022-05-12 DIAGNOSIS — E1122 Type 2 diabetes mellitus with diabetic chronic kidney disease: Secondary | ICD-10-CM

## 2022-05-12 DIAGNOSIS — I152 Hypertension secondary to endocrine disorders: Secondary | ICD-10-CM

## 2022-05-12 DIAGNOSIS — Z7985 Long-term (current) use of injectable non-insulin antidiabetic drugs: Secondary | ICD-10-CM

## 2022-05-12 DIAGNOSIS — F0390 Unspecified dementia without behavioral disturbance: Secondary | ICD-10-CM | POA: Diagnosis not present

## 2022-05-12 LAB — CBC
Platelets: 362 10*3/uL (ref 140–400)
RBC: 4.98 10*6/uL (ref 3.80–5.10)
WBC: 8.7 10*3/uL (ref 3.8–10.8)

## 2022-05-12 MED ORDER — BUSPIRONE HCL 5 MG PO TABS
5.0000 mg | ORAL_TABLET | Freq: Two times a day (BID) | ORAL | 5 refills | Status: DC
Start: 2022-05-12 — End: 2022-06-11

## 2022-05-12 MED ORDER — EMPAGLIFLOZIN 10 MG PO TABS: ORAL_TABLET | ORAL | 0 refills | Status: DC

## 2022-05-12 NOTE — Progress Notes (Signed)
Casey [name] is a 87 y.o. female who presents today for an office visit.  Assessment/Plan:  New/Acute Problems: Hypokalemia  Started on potassium 20 mEq twice daily by the ED. Also on magnesium chloride 64 mg twice daily.  We will check c-Met with magnesium today.  She has had persistent low potassium for the last couple of months thought to be likely medication induced secondary to her antihypertensives including HCTZ and lisinopril.  Patient does not have her medications with her and is not very clear on what antihypertensive that she has still been taking though she believes that she has been consistent with her lisinopril 40 mg daily and is no longer taking HCTZ or amlodipine.  Discussed importance of bringing medications to the office so that we can be sure of what medications that she has been taking.  We also do have home health referral pending that we will hopefully start soon that can help with medication adherence as well.  She has had intermittent AKI over the last few months likely secondary to dehydration and possibly medications which could be contributing to her hypokalemia as well.  If continues to have hypokalemia without obvious etiology would consider referral to nephrology. If we do decide to continue with potassium supplementation indefinitely would likely switch her ACE inhibitor to amlodipine to minimize risk of hyperkalemia especially with her recent AKIs.  Renal Mass Unclear etiology though appears benign.  Will check abdominal MRI per radiology request for definitive evaluation.  Chronic Problems Addressed Today: Hypertension associated with diabetes (HCC) As above patient is not clear on what regimen she is taking.  Believes that she is on lisinopril 40 mg daily and is no longer on amlodipine or HCTZ.  Blood pressure is at goal today.  She will come back in 1 to 2 weeks and bring medications back in so that we can verify what medications that she is taking.  Also do have  home health referral pending which can help with medication assistance as well.  PUD (peptic ulcer disease) Has GI appointment in 2 weeks.  She is on Protonix 40 mg daily and Carafate 4 times daily.  Will check CBC today to look for any signs of anemia.  Dementia without behavioral disturbance [facility]) She has seen neurology for this however has not yet been able to get the rivastigmine patch due to insurance issues.  Referral to home health is pending.  She also has upcoming appointment with psychiatry.  GAD (generalized anxiety disorder) Patient again asked for [name] to restart alprazolam.  We discontinued this several months ago.  This was prescribed by her previous PCP.  Discussed with patient that it is not safe in combination with her narcotics.  She is currently on Seroquel 100 mg nightly and Remeron 7.5 mg nightly.  We will add on BuSpar 5 mg twice daily.  She does not think that the hydroxyzine has helped much with her anxiety.  She does have upcoming appoint with psychiatry next week as above.  Advised her that she would need to discuss with them for ongoing management.     Subjective:  HPI:  See A/P for status of chronic conditions.  Patient is here today for follow-up.  Went to the ED 9 days ago with bad headache.  Found to be hypokalemic with bigeminy.  She was given potassium replacement in the ED and discharged home.  Remainder of her labs are reassuring and she was discharged home.  She was started on oral potassium  and oral magnesium at the time of discharge.  She feels ok today.  She has been taking her potassium and magnesium supplement.  She is not sure which blood pressure medication she is taking but she thinks that she is still taking lisinopril 40 mg daily and has been out of the amlodipine 10 mg daily for the last 1 to 2 months.  She does not have her medications with her today.  No chest pain or shortness of breath.  No further episodes of headache.  She does sometimes feel a  "vibration" sensation in her lower extremities.  Still has some abdominal pain as well.  Uses MiraLAX as needed for constipation which works well.  When I last saw her 13 days ago primary concern was her epigastric abdominal pain.  She was advised to call to schedule an appointment with GI.  Apparently they cannot see her for several more weeks.  She is on Protonix 40 mg twice daily and Carafate 4 times daily.  She has been consistent with these medications.  She has not noticed any signs of melena or hematochezia since her last visit.    There was concern for medication nonadherence at our last visit and safety at home and we refer her to home health.  They called her yesterday and she will be calling back today to schedule a visit.  She also has been in touch with the case manager from her insurance company and they have helped her schedule an appointment for psychiatry next week.  She also asks about restarting alprazolam.  She says that she needs something to help patient calm her nerves".  States that the alprazolam has worked best for her in the past and that everything else that is been tried does not work.  She states that she has  been consistent with Remeron 7.5 mg nightly and Seroquel 100 mg nightly.  Also since her last visit we got an ultrasound for an indeterminate right renal mass.  Ultrasound was also indeterminate and radiology recommended pursuing MRI at this time.        Objective:  Physical Exam: BP 130/79   Pulse (!) 58   Temp 97.7 F (36.5 C) (Temporal)   Ht  (1.575 m)   Wt 154 lb 3.2 oz (69.9 kg)   SpO2 100%   BMI 28.20 kg/m   Gen: No acute distress, resting comfortably CV: Regular rate and rhythm with no murmurs appreciated Pulm: Normal work of breathing, clear to auscultation bilaterally with no crackles, wheezes, or rhonchi Neuro: Grossly normal, moves all extremities Psych: Normal affect and thought content  Time Spent: 45 minutes of total time was spent  on the date of the encounter performing the following actions: chart review prior to seeing the patient including recent ED visit, obtaining history, performing a medically necessary exam, counseling on the treatment plan, placing orders, and documenting in our EHR.        [name]. [name], MD 05/12/2022 10:06 AM

## 2022-05-12 NOTE — Patient Instructions (Addendum)
It was very nice to see you today!  We will check blood work today make sure your potassium and magnesium are okay.  We will get an MRI of your abdomen.  Please try the BuSpar to help with your nerves.  Will see back in a couple of weeks.  Come back sooner if needed.  Take care, Dr [name]  PLEASE NOTE:  If you had any lab tests, please let us know if you have not heard back within a few days. You may see your results on mychart before we have a chance to review them but we will give you a call once they are reviewed by [name].   If we ordered any referrals today, please let us know if you have not heard from their office within the next week.   If you had any urgent prescriptions sent in today, please check with the pharmacy within an hour of our visit to make sure the prescription was transmitted appropriately.   Please try these tips to maintain a healthy lifestyle:  Eat at least 3 REAL meals and 1-2 snacks per day.  Aim for no more than 5 hours between eating.  If you eat breakfast, please do so within one hour of getting up.   Each meal should contain half fruits/vegetables, one quarter protein, and one quarter carbs (no bigger than a computer mouse)  Cut down on sweet beverages. This includes juice, soda, and sweet tea.   Drink at least 1 glass of water with each meal and aim for at least 8 glasses per day  Exercise at least 150 minutes every week.

## 2022-05-12 NOTE — Assessment & Plan Note (Signed)
Patient again asked for [name] to restart alprazolam.  We discontinued this several months ago.  This was prescribed by her previous PCP.  Discussed with patient that it is not safe in combination with her narcotics.  She is currently on Seroquel 100 mg nightly and Remeron 7.5 mg nightly.  We will add on BuSpar 5 mg twice daily.  She does not think that the hydroxyzine has helped much with her anxiety.  She does have upcoming appoint with psychiatry next week as above.  Advised her that she would need to discuss with them for ongoing management.

## 2022-05-12 NOTE — Assessment & Plan Note (Signed)
Has GI appointment in 2 weeks.  She is on Protonix 40 mg daily and Carafate 4 times daily.  Will check CBC today to look for any signs of anemia.

## 2022-05-12 NOTE — Assessment & Plan Note (Signed)
As above patient is not clear on what regimen she is taking.  Believes that she is on lisinopril 40 mg daily and is no longer on amlodipine or HCTZ.  Blood pressure is at goal today.  She will come back in 1 to 2 weeks and bring medications back in so that we can verify what medications that she is taking.  Also do have home health referral pending which can help with medication assistance as well.

## 2022-05-12 NOTE — Assessment & Plan Note (Signed)
She has seen neurology for this however has not yet been able to get the rivastigmine patch due to insurance issues.  Referral to home health is pending.  She also has upcoming appointment with psychiatry.

## 2022-05-13 ENCOUNTER — Telehealth: Payer: Self-pay | Admitting: Family Medicine

## 2022-05-13 LAB — COMPREHENSIVE METABOLIC PANEL
AG Ratio: 1.5 (calc) (ref 1.0–2.5)
ALT: 16 U/L (ref 6–29)
AST: 18 U/L (ref 10–35)
Albumin: 4.6 g/dL (ref 3.6–5.1)
Alkaline phosphatase (APISO): 91 U/L (ref 37–153)
BUN/Creatinine Ratio: 12 (calc) (ref 6–22)
BUN: 16 mg/dL (ref 7–25)
CO2: 27 mmol/L (ref 20–32)
Calcium: 11.1 mg/dL — ABNORMAL HIGH (ref 8.6–10.4)
Chloride: 94 mmol/L — ABNORMAL LOW (ref 98–110)
Creat: 1.35 mg/dL — ABNORMAL HIGH (ref 0.60–0.95)
Globulin: 3 g/dL (calc) (ref 1.9–3.7)
Glucose, Bld: 185 mg/dL — ABNORMAL HIGH (ref 65–99)
Potassium: 4.8 mmol/L (ref 3.5–5.3)
Sodium: 136 mmol/L (ref 135–146)
Total Bilirubin: 0.4 mg/dL (ref 0.2–1.2)
Total Protein: 7.6 g/dL (ref 6.1–8.1)

## 2022-05-13 LAB — CBC
HCT: 42.9 % (ref 35.0–45.0)
Hemoglobin: 14 g/dL (ref 11.7–15.5)
MCH: 28.1 pg (ref 27.0–33.0)
MCHC: 32.6 g/dL (ref 32.0–36.0)
MCV: 86.1 fL (ref 80.0–100.0)
MPV: 9.1 fL (ref 7.5–12.5)
RDW: 14.3 % (ref 11.0–15.0)

## 2022-05-13 LAB — MAGNESIUM: Magnesium: 1.6 mg/dL (ref 1.5–2.5)

## 2022-05-13 NOTE — Progress Notes (Signed)
Her potassium is at goal however her kidney numbers are up.  Recommend she STOP her lisinopril. This will help with her kidney numbers.   I would like to see her back in 1-2 weeks. She should continue to monitor her blood pressure at home and let us know if it is persistently elevated.  She should bring all of her medications with her at her next visit so that we can make sure she is taking the medications that we have prescribed.

## 2022-05-13 NOTE — Telephone Encounter (Signed)
Patient returned call in regards to lab results. Requests callback when able.  

## 2022-05-14 NOTE — Telephone Encounter (Signed)
See results note. 

## 2022-05-21 ENCOUNTER — Other Ambulatory Visit: Payer: Self-pay | Admitting: Family Medicine

## 2022-05-21 DIAGNOSIS — M170 Bilateral primary osteoarthritis of knee: Secondary | ICD-10-CM

## 2022-05-21 NOTE — Telephone Encounter (Signed)
Prescription Request  05/21/2022  LOV: 05/12/2022  What is the name of the medication or equipment? HYDROcodone-acetaminophen (NORCO/VICODIN) 5-325 MG tablet   Have you contacted your pharmacy to request a refill? Yes   Which pharmacy would you like this sent to?  [id number] - [place], [place] - [address] AT [address]  [place] [address] Phone: [phone] Fax: [phone]     Patient notified that their request is being sent to the clinical staff for review and that they should receive a response within 2 business days.   Please advise at Mobile [phone] (mobile)

## 2022-05-22 MED ORDER — HYDROCODONE-ACETAMINOPHEN 5-325 MG PO TABS
1.0000 | ORAL_TABLET | Freq: Three times a day (TID) | ORAL | 0 refills | Status: DC | PRN
Start: 2022-05-22 — End: 2022-06-23

## 2022-05-25 ENCOUNTER — Ambulatory Visit: Payer: 59 | Admitting: Family Medicine

## 2022-05-28 ENCOUNTER — Telehealth: Payer: Self-pay | Admitting: *Deleted

## 2022-05-28 ENCOUNTER — Ambulatory Visit: Payer: 59 | Admitting: Family Medicine

## 2022-05-28 NOTE — Telephone Encounter (Signed)
Patient had an OV today, was No show unusual for her. Call her home this morning no answer  Call now at 0330 no answer I Got in touch with her nice Seward Carol Matier at [phone] stated patient sometime will not heard the phone and will stop to check on her after work

## 2022-05-29 ENCOUNTER — Emergency Department (HOSPITAL_COMMUNITY): Payer: 59

## 2022-05-29 ENCOUNTER — Encounter (HOSPITAL_COMMUNITY): Payer: Self-pay

## 2022-05-29 ENCOUNTER — Inpatient Hospital Stay (HOSPITAL_COMMUNITY): Payer: 59

## 2022-05-29 ENCOUNTER — Inpatient Hospital Stay (HOSPITAL_COMMUNITY)
Admission: EM | Admit: 2022-05-29 | Discharge: 2022-06-02 | DRG: 684 | Disposition: A | Discharge status: Home Health | Payer: 59 | Attending: Internal Medicine | Admitting: Internal Medicine

## 2022-05-29 ENCOUNTER — Other Ambulatory Visit: Payer: Self-pay

## 2022-05-29 DIAGNOSIS — Z8249 Family history of ischemic heart disease and other diseases of the circulatory system: Secondary | ICD-10-CM | POA: Diagnosis not present

## 2022-05-29 DIAGNOSIS — G3184 Mild cognitive impairment, so stated: Secondary | ICD-10-CM | POA: Diagnosis present

## 2022-05-29 DIAGNOSIS — K5900 Constipation, unspecified: Secondary | ICD-10-CM | POA: Diagnosis present

## 2022-05-29 DIAGNOSIS — R531 Weakness: Secondary | ICD-10-CM | POA: Diagnosis present

## 2022-05-29 DIAGNOSIS — E7800 Pure hypercholesterolemia, unspecified: Secondary | ICD-10-CM | POA: Diagnosis present

## 2022-05-29 DIAGNOSIS — E1169 Type 2 diabetes mellitus with other specified complication: Secondary | ICD-10-CM | POA: Diagnosis not present

## 2022-05-29 DIAGNOSIS — G609 Hereditary and idiopathic neuropathy, unspecified: Secondary | ICD-10-CM | POA: Diagnosis present

## 2022-05-29 DIAGNOSIS — F411 Generalized anxiety disorder: Secondary | ICD-10-CM | POA: Diagnosis present

## 2022-05-29 DIAGNOSIS — I152 Hypertension secondary to endocrine disorders: Secondary | ICD-10-CM | POA: Diagnosis present

## 2022-05-29 DIAGNOSIS — E785 Hyperlipidemia, unspecified: Secondary | ICD-10-CM | POA: Diagnosis not present

## 2022-05-29 DIAGNOSIS — K279 Peptic ulcer, site unspecified, unspecified as acute or chronic, without hemorrhage or perforation: Secondary | ICD-10-CM | POA: Diagnosis present

## 2022-05-29 DIAGNOSIS — F32A Depression, unspecified: Secondary | ICD-10-CM | POA: Diagnosis present

## 2022-05-29 DIAGNOSIS — E1129 Type 2 diabetes mellitus with other diabetic kidney complication: Secondary | ICD-10-CM | POA: Diagnosis not present

## 2022-05-29 DIAGNOSIS — Z96651 Presence of right artificial knee joint: Secondary | ICD-10-CM | POA: Diagnosis present

## 2022-05-29 DIAGNOSIS — R079 Chest pain, unspecified: Secondary | ICD-10-CM | POA: Diagnosis present

## 2022-05-29 DIAGNOSIS — G629 Polyneuropathy, unspecified: Secondary | ICD-10-CM | POA: Diagnosis not present

## 2022-05-29 DIAGNOSIS — N2889 Other specified disorders of kidney and ureter: Secondary | ICD-10-CM | POA: Diagnosis not present

## 2022-05-29 DIAGNOSIS — Z841 Family history of disorders of kidney and ureter: Secondary | ICD-10-CM

## 2022-05-29 DIAGNOSIS — Z800 Family history of malignant neoplasm of digestive organs: Secondary | ICD-10-CM | POA: Diagnosis not present

## 2022-05-29 DIAGNOSIS — N179 Acute kidney failure, unspecified: Principal | ICD-10-CM | POA: Diagnosis present

## 2022-05-29 DIAGNOSIS — Z7982 Long term (current) use of aspirin: Secondary | ICD-10-CM

## 2022-05-29 DIAGNOSIS — Z7984 Long term (current) use of oral hypoglycemic drugs: Secondary | ICD-10-CM | POA: Diagnosis not present

## 2022-05-29 DIAGNOSIS — E1165 Type 2 diabetes mellitus with hyperglycemia: Secondary | ICD-10-CM | POA: Diagnosis present

## 2022-05-29 DIAGNOSIS — R809 Proteinuria, unspecified: Secondary | ICD-10-CM | POA: Diagnosis present

## 2022-05-29 DIAGNOSIS — R319 Hematuria, unspecified: Secondary | ICD-10-CM | POA: Diagnosis present

## 2022-05-29 DIAGNOSIS — E860 Dehydration: Secondary | ICD-10-CM

## 2022-05-29 DIAGNOSIS — Z79899 Other long term (current) drug therapy: Secondary | ICD-10-CM

## 2022-05-29 DIAGNOSIS — Z86004 Personal history of in-situ neoplasm of other and unspecified digestive organs: Secondary | ICD-10-CM

## 2022-05-29 DIAGNOSIS — Z833 Family history of diabetes mellitus: Secondary | ICD-10-CM | POA: Diagnosis not present

## 2022-05-29 DIAGNOSIS — E1159 Type 2 diabetes mellitus with other circulatory complications: Secondary | ICD-10-CM | POA: Diagnosis not present

## 2022-05-29 LAB — URINALYSIS, ROUTINE W REFLEX MICROSCOPIC
Bacteria, UA: NONE SEEN
Bilirubin Urine: NEGATIVE
Glucose, UA: 50 mg/dL — AB
Ketones, ur: 5 mg/dL — AB
Leukocytes,Ua: NEGATIVE
Nitrite: NEGATIVE
Protein, ur: 30 mg/dL — AB
Specific Gravity, Urine: 1.016 (ref 1.005–1.030)
pH: 5 (ref 5.0–8.0)

## 2022-05-29 LAB — CBC WITH DIFFERENTIAL/PLATELET
Abs Immature Granulocytes: 0.03 10*3/uL (ref 0.00–0.07)
Basophils Absolute: 0.1 10*3/uL (ref 0.0–0.1)
Basophils Relative: 1 %
Eosinophils Absolute: 0 10*3/uL (ref 0.0–0.5)
Eosinophils Relative: 1 %
HCT: 40.4 % (ref 36.0–46.0)
Hemoglobin: 12.8 g/dL (ref 12.0–15.0)
Immature Granulocytes: 0 %
Lymphocytes Relative: 32 %
Lymphs Abs: 2.8 10*3/uL (ref 0.7–4.0)
MCH: 27.3 pg (ref 26.0–34.0)
MCHC: 31.7 g/dL (ref 30.0–36.0)
MCV: 86.1 fL (ref 80.0–100.0)
Monocytes Absolute: 0.7 10*3/uL (ref 0.1–1.0)
Monocytes Relative: 8 %
Neutro Abs: 5.1 10*3/uL (ref 1.7–7.7)
Neutrophils Relative %: 58 %
Platelets: 333 10*3/uL (ref 150–400)
RBC: 4.69 MIL/uL (ref 3.87–5.11)
RDW: 15.6 % — ABNORMAL HIGH (ref 11.5–15.5)
WBC: 8.8 10*3/uL (ref 4.0–10.5)
nRBC: 0 % (ref 0.0–0.2)

## 2022-05-29 LAB — CK: Total CK: 387 U/L — ABNORMAL HIGH (ref 38–234)

## 2022-05-29 LAB — COMPREHENSIVE METABOLIC PANEL
ALT: 17 U/L (ref 0–44)
AST: 19 U/L (ref 15–41)
Albumin: 4.2 g/dL (ref 3.5–5.0)
Alkaline Phosphatase: 86 U/L (ref 38–126)
Anion gap: 22 — ABNORMAL HIGH (ref 5–15)
BUN: 87 mg/dL — ABNORMAL HIGH (ref 8–23)
CO2: 15 mmol/L — ABNORMAL LOW (ref 22–32)
Calcium: 9.8 mg/dL (ref 8.9–10.3)
Chloride: 98 mmol/L (ref 98–111)
Creatinine, Ser: 7.04 mg/dL — ABNORMAL HIGH (ref 0.44–1.00)
GFR, Estimated: 5 mL/min — ABNORMAL LOW (ref >60)
Glucose, Bld: 132 mg/dL — ABNORMAL HIGH (ref 70–99)
Potassium: 4.5 mmol/L (ref 3.5–5.1)
Sodium: 135 mmol/L (ref 135–145)
Total Bilirubin: 0.6 mg/dL (ref 0.3–1.2)
Total Protein: 7.7 g/dL (ref 6.5–8.1)

## 2022-05-29 LAB — LIPASE, BLOOD: Lipase: 38 U/L (ref 11–51)

## 2022-05-29 LAB — TROPONIN I (HIGH SENSITIVITY)
Troponin I (High Sensitivity): 7 ng/L (ref <18)
Troponin I (High Sensitivity): 7 ng/L (ref <18)

## 2022-05-29 LAB — MAGNESIUM: Magnesium: 2.4 mg/dL (ref 1.7–2.4)

## 2022-05-29 LAB — GLUCOSE, CAPILLARY: Glucose-Capillary: 109 mg/dL — ABNORMAL HIGH (ref 70–99)

## 2022-05-29 MED ORDER — ACETAMINOPHEN 650 MG RE SUPP: 650.0000 mg | Freq: Four times a day (QID) | RECTAL | Status: DC | PRN

## 2022-05-29 MED ORDER — LACTATED RINGERS IV BOLUS
1000.0000 mL | Freq: Once | INTRAVENOUS | Status: AC
  Administered 2022-05-29: 1000 mL via INTRAVENOUS

## 2022-05-29 MED ORDER — HEPARIN SODIUM (PORCINE) 5000 UNIT/ML IJ SOLN
5000.0000 [IU] | Freq: Three times a day (TID) | INTRAMUSCULAR | Status: DC
  Administered 2022-05-29 – 2022-06-02 (×11): 5000 [IU] via SUBCUTANEOUS
  Filled 2022-05-29 (×11): qty 1

## 2022-05-29 MED ORDER — BUSPIRONE HCL 5 MG PO TABS
5.0000 mg | ORAL_TABLET | Freq: Two times a day (BID) | ORAL | Status: DC
  Administered 2022-05-30 – 2022-06-02 (×7): 5 mg via ORAL
  Filled 2022-05-29 (×7): qty 1

## 2022-05-29 MED ORDER — QUETIAPINE FUMARATE ER 50 MG PO TB24
50.0000 mg | ORAL_TABLET | Freq: Every day | ORAL | Status: DC
  Administered 2022-05-30 – 2022-06-01 (×4): 50 mg via ORAL
  Filled 2022-05-29 (×6): qty 1

## 2022-05-29 MED ORDER — ATORVASTATIN CALCIUM 10 MG PO TABS
10.0000 mg | ORAL_TABLET | Freq: Every day | ORAL | Status: DC
  Administered 2022-05-30 – 2022-05-31 (×2): 10 mg via ORAL
  Filled 2022-05-29 (×2): qty 1

## 2022-05-29 MED ORDER — FAMOTIDINE IN NACL 20-0.9 MG/50ML-% IV SOLN
20.0000 mg | Freq: Once | INTRAVENOUS | Status: AC
  Administered 2022-05-29: 20 mg via INTRAVENOUS
  Filled 2022-05-29: qty 50

## 2022-05-29 MED ORDER — SUCRALFATE 1 GM/10ML PO SUSP
1.0000 g | Freq: Three times a day (TID) | ORAL | Status: DC
  Administered 2022-05-29 – 2022-06-02 (×12): 1 g via ORAL
  Filled 2022-05-29 (×12): qty 10

## 2022-05-29 MED ORDER — INSULIN ASPART 100 UNIT/ML IJ SOLN
0.0000 [IU] | Freq: Three times a day (TID) | INTRAMUSCULAR | Status: DC
  Administered 2022-05-30 – 2022-06-02 (×4): 1 [IU] via SUBCUTANEOUS

## 2022-05-29 MED ORDER — MIRTAZAPINE 15 MG PO TABS
7.5000 mg | ORAL_TABLET | Freq: Every day | ORAL | Status: DC
  Administered 2022-05-29 – 2022-06-01 (×4): 7.5 mg via ORAL
  Filled 2022-05-29 (×4): qty 1

## 2022-05-29 MED ORDER — HYDROXYZINE HCL 10 MG PO TABS
10.0000 mg | ORAL_TABLET | Freq: Three times a day (TID) | ORAL | Status: DC | PRN
  Administered 2022-06-01: 10 mg via ORAL
  Filled 2022-05-29 (×2): qty 1

## 2022-05-29 MED ORDER — ALUM & MAG HYDROXIDE-SIMETH 200-200-20 MG/5ML PO SUSP
30.0000 mL | Freq: Once | ORAL | Status: AC
  Administered 2022-05-29: 30 mL via ORAL
  Filled 2022-05-29: qty 30

## 2022-05-29 MED ORDER — PANTOPRAZOLE SODIUM 40 MG PO TBEC
40.0000 mg | DELAYED_RELEASE_TABLET | Freq: Every day | ORAL | Status: DC
  Administered 2022-05-29 – 2022-06-02 (×5): 40 mg via ORAL
  Filled 2022-05-29 (×5): qty 1

## 2022-05-29 MED ORDER — PROCHLORPERAZINE EDISYLATE 10 MG/2ML IJ SOLN: 10.0000 mg | Freq: Four times a day (QID) | INTRAMUSCULAR | Status: DC | PRN

## 2022-05-29 MED ORDER — HYDROCODONE-ACETAMINOPHEN 5-325 MG PO TABS
1.0000 | ORAL_TABLET | Freq: Three times a day (TID) | ORAL | Status: DC | PRN
  Administered 2022-05-30 – 2022-06-02 (×5): 1 via ORAL
  Filled 2022-05-29 (×5): qty 1

## 2022-05-29 MED ORDER — SODIUM CHLORIDE 0.9 % IV BOLUS
1000.0000 mL | Freq: Once | INTRAVENOUS | Status: AC
  Administered 2022-05-29: 1000 mL via INTRAVENOUS

## 2022-05-29 MED ORDER — ACETAMINOPHEN 325 MG PO TABS
650.0000 mg | ORAL_TABLET | Freq: Four times a day (QID) | ORAL | Status: DC | PRN
  Administered 2022-06-02: 650 mg via ORAL
  Filled 2022-05-29: qty 2

## 2022-05-29 MED ORDER — LACTATED RINGERS IV SOLN: INTRAVENOUS | Status: AC

## 2022-05-29 MED ORDER — SENNOSIDES-DOCUSATE SODIUM 8.6-50 MG PO TABS: 1.0000 | ORAL_TABLET | Freq: Every evening | ORAL | Status: DC | PRN

## 2022-05-29 NOTE — ED Notes (Signed)
ED TO INPATIENT HANDOFF REPORT  ED Nurse Name and Phone #:  [phone]  S Name/Age/Gender Casey [name] 87 y.o. female Room/Bed: 007C/007C  Code Status   Code Status: Prior  Home/SNF/Other Home Patient oriented to: self, place, time, and situation Is this baseline? Yes   Triage Complete: Triage complete  Chief Complaint Acute renal failure (ARF) (HCC) [N17.9]  Triage Note Pt arrives via [facility] from home after neighbors called a welfare check for not seeing pt in several days. On arrival pt c/o malaise, chest pain, constipation, poor PO intake, and inability to care for herself generally.    Allergies No Known Allergies  Level of Care/Admitting Diagnosis ED Disposition     ED Disposition  Admit   Condition  --   Comment  Hospital Area: [facility] [[id number]]  Level of Care: Med-Surg [16]  May admit patient to [facility] or [facility] if equivalent level of care is available:: No  Covid Evaluation: Asymptomatic - no recent exposure (last 10 days) testing not required  Diagnosis: Acute renal failure (ARF) [facility]) [[id number]]  Admitting Physician: Charlsie Quest [[id number]]  Attending Physician: Charlsie Quest [phone]  Certification:: I certify this patient will need inpatient services for at least 2 midnights  Estimated Length of Stay: 2          B Medical/Surgery History Past Medical History:  Diagnosis Date   ALLERGIC RHINITIS    Allergy    ANXIETY    ARM PAIN, RIGHT    BENIGN POSITIONAL VERTIGO    Carcinoma in situ of colon    CONSTIPATION    CYSTOCELE WITH INCOMPLETE UTERINE PROLAPSE    Diabetes mellitus without complication (HCC)    ESOPHAGEAL STRICTURE    FATIGUE    FLANK PAIN, LEFT    Headache(784.0)    HIP PAIN, LEFT    HYPERCHOLESTEROLEMIA    HYPERGLYCEMIA    HYPERTENSION, BENIGN ESSENTIAL 03/03/2007   LEG EDEMA, BILATERAL    Microscopic hematuria    OBESITY    OSTEOARTHRITIS, KNEE    OSTEOPOROSIS    Peripheral neuropathy,  idiopathic    Problems with swallowing and mastication    SINUSITIS, RECURRENT    SPRAIN&STRAIN OTH SPEC SITES SHOULDER&UPPER ARM    Past Surgical History:  Procedure Laterality Date   BIOPSY  02/06/2022   Procedure: BIOPSY;  Surgeon: Kathi Der, MD;  Location: [facility];  Service: Gastroenterology;;   COLONOSCOPY     ESOPHAGOGASTRODUODENOSCOPY (EGD) WITH PROPOFOL N/A 02/06/2022   Procedure: ESOPHAGOGASTRODUODENOSCOPY (EGD) WITH PROPOFOL;  Surgeon: Kathi Der, MD;  Location: [facility];  Service: Gastroenterology;  Laterality: N/A;   KNEE ARTHROPLASTY Right 07/24/2020   Procedure: COMPUTER ASSISTED TOTAL KNEE ARTHROPLASTY;  Surgeon: Samson Frederic, MD;  Location: [facility];  Service: Orthopedics;  Laterality: Right;   LEFT HEART CATHETERIZATION WITH CORONARY ANGIOGRAM N/A 12/02/2010   Procedure: LEFT HEART CATHETERIZATION WITH CORONARY ANGIOGRAM;  Surgeon: Peter M Swaziland, MD;  Location: [facility] CATH LAB;  Service: Cardiovascular;:: NORMAL CORONARY ARTERIES - NORMAL LV FUNCTION & EDP.     TRANSTHORACIC ECHOCARDIOGRAM  11/2010   Normal EF 60-65%. No RWMA.  Mild LAD dilation c/w Gr 1 DD.  -- PRETTY NORMAL ECHO     A IV Location/Drains/Wounds Patient Lines/Drains/Airways Status     Active Line/Drains/Airways     Name Placement date Placement time Site Days   Peripheral IV 05/29/22 20 G Left Antecubital 05/29/22  1635  Antecubital  less than 1  Intake/Output Last 24 hours  Intake/Output Summary (Last 24 hours) at 05/29/2022 2030 Last data filed at 05/29/2022 1807 Gross per 24 hour  Intake 1052.08 ml  Output --  Net 1052.08 ml    Labs/Imaging Results for orders placed or performed during the hospital encounter of 05/29/22 (from the past 48 hour(s))  CBC with Differential     Status: Abnormal   Collection Time: 05/29/22  4:35 PM  Result Value Ref Range   WBC 8.8 4.0 - 10.5 K/uL   RBC 4.69 3.87 - 5.11 MIL/uL   Hemoglobin 12.8 12.0 - 15.0 g/dL   HCT 09.8  11.9 - 14.7 %   MCV 86.1 80.0 - 100.0 fL   MCH 27.3 26.0 - 34.0 pg   MCHC 31.7 30.0 - 36.0 g/dL   RDW 82.9 (H) 56.2 - 13.0 %   Platelets 333 150 - 400 K/uL   nRBC 0.0 0.0 - 0.2 %   Neutrophils Relative % 58 %   Neutro Abs 5.1 1.7 - 7.7 K/uL   Lymphocytes Relative 32 %   Lymphs Abs 2.8 0.7 - 4.0 K/uL   Monocytes Relative 8 %   Monocytes Absolute 0.7 0.1 - 1.0 K/uL   Eosinophils Relative 1 %   Eosinophils Absolute 0.0 0.0 - 0.5 K/uL   Basophils Relative 1 %   Basophils Absolute 0.1 0.0 - 0.1 K/uL   Immature Granulocytes 0 %   Abs Immature Granulocytes 0.03 0.00 - 0.07 K/uL    Comment: Performed at [facility], [address] [place], [place] [address]  Comprehensive metabolic panel     Status: Abnormal   Collection Time: 05/29/22  4:35 PM  Result Value Ref Range   Sodium 135 135 - 145 mmol/L   Potassium 4.5 3.5 - 5.1 mmol/L   Chloride 98 98 - 111 mmol/L   CO2 15 (L) 22 - 32 mmol/L   Glucose, Bld 132 (H) 70 - 99 mg/dL    Comment: Glucose reference range applies only to samples taken after fasting for at least 8 hours.   BUN 87 (H) 8 - 23 mg/dL   Creatinine, Ser 4.69 (H) 0.44 - 1.00 mg/dL   Calcium 9.8 8.9 - 62.9 mg/dL   Total Protein 7.7 6.5 - 8.1 g/dL   Albumin 4.2 3.5 - 5.0 g/dL   AST 19 15 - 41 U/L   ALT 17 0 - 44 U/L   Alkaline Phosphatase 86 38 - 126 U/L   Total Bilirubin 0.6 0.3 - 1.2 mg/dL   GFR, Estimated 5 (L) >60 mL/min    Comment: (NOTE) Calculated using the CKD-EPI Creatinine Equation (2021)    Anion gap 22 (H) 5 - 15    Comment: ELECTROLYTES REPEATED TO VERIFY Performed at [facility], [address], [place], [place] [address]   Troponin I (High Sensitivity)     Status: None   Collection Time: 05/29/22  4:35 PM  Result Value Ref Range   Troponin I (High Sensitivity) 7 <18 ng/L    Comment: (NOTE) Elevated high sensitivity troponin I (hsTnI) values and significant  changes across serial measurements may suggest ACS but many other  chronic  and acute conditions are known to elevate hsTnI results.  Refer to the "Links" section for chest pain algorithms and additional  guidance. Performed at [facility], [address], [place], [place] [address]   Lipase, blood     Status: None   Collection Time: 05/29/22  4:35 PM  Result Value Ref Range   Lipase 38 11 - 51 U/L    Comment: Performed at [facility], [address], [place], [place] [address]  Magnesium     Status: None   Collection Time: 05/29/22  4:35 PM  Result Value Ref Range   Magnesium 2.4 1.7 - 2.4 mg/dL    Comment: Performed at [facility], [address] [place], [place] [address]   DG Abd Acute W/Chest  Result Date: 05/29/2022 CLINICAL DATA:  Constipation and chest pain EXAM: DG ABDOMEN ACUTE WITH 1 VIEW CHEST COMPARISON:  Chest radiograph dated 05/04/2022, CT abdomen and pelvis dated 04/23/2022 FINDINGS: Lines/tubes: None. Chest: Lungs are clear without focal consolidation. No pneumothorax or pleural effusion. Normal heart size. Abdomen: Nonobstructive bowel gas pattern. No pneumatosis or free air. No abnormal calcification or mass effect. Bones: No acute osseous abnormality. IMPRESSION: Negative abdominal radiographs.  No acute cardiopulmonary disease. Electronically Signed   By: Agustin Cree M.D.   On: 05/29/2022 16:51    Pending Labs Unresulted Labs (From admission, onward)     Start     Ordered   05/29/22 2013  Sodium, urine, random  Add-on,   AD        05/29/22 2012   05/29/22 2013  Creatinine, urine, random  Add-on,   AD        05/29/22 2012   05/29/22 1846  CK  Add-on,   AD        05/29/22 1845   05/29/22 1544  Urinalysis, Routine w reflex microscopic -Urine, Clean Catch  Once,   URGENT       Question:  Specimen Source  Answer:  Urine, Clean Catch   05/29/22 1547            Vitals/Pain Today's Vitals   05/29/22 1815 05/29/22 1830 05/29/22 1845 05/29/22 1929  BP: (!) 124/54 120/60 (!) 140/62 133/72  Pulse: (!) 42 (!) 102 (!)  46 73  Resp: 16 20 13 16   Temp:    97.8 F (36.6 C)  TempSrc:    Oral  SpO2: (!) 21% (!) 50% (!) 32% 100%  Weight:      Height:      PainSc:    4     Isolation Precautions No active isolations  Medications Medications  sodium chloride 0.9 % bolus 1,000 mL (0 mLs Intravenous Stopped 05/29/22 1807)  famotidine (PEPCID) IVPB 20 mg premix (0 mg Intravenous Stopped 05/29/22 1741)  alum & mag hydroxide-simeth (MAALOX/MYLANTA) 200-200-20 MG/5ML suspension 30 mL (30 mLs Oral Given 05/29/22 1640)  lactated ringers bolus 1,000 mL (1,000 mLs Intravenous New Bag/Given 05/29/22 1958)    Mobility walks with device     Focused Assessments    R Recommendations: See Admitting Provider Note  Report given to:   Additional Notes: a/ox4, ambulates with walker, continent x2.

## 2022-05-29 NOTE — Hospital Course (Signed)
Casey [name] is a 87 y.o. female with medical history significant for T2DM, HTN, HLD, PUD, anxiety, mild cognitive impairment who is admitted with acute kidney injury.

## 2022-05-29 NOTE — ED Provider Notes (Signed)
  Physical Exam  BP (!) 119/57   Pulse 64   Temp 98.5 F (36.9 C) (Oral)   Resp 18   Ht 5\' 2"  (1.575 m)   Wt 69.9 kg   SpO2 100%   BMI 28.19 kg/m   Physical Exam  Procedures  Procedures  ED Course / MDM   Clinical Course as of 05/29/22 2320  Fri May 29, 2022  1604 Patient signed out to Dr. Jarold Motto pending labs, XR and reassessment [VK]  1623 Assumed care from off going provider.  87 year old female with a history of hypertension and diabetes who presents with generalized weakness for 1 week with decreased p.o. intake and epigastric discomfort.  Has had soft blood pressures that are now in the low 100s and was given some fluids.  Awaiting lab work. [RP]  1904 Patient's labs returned and show that she has a severe AKI.  No hyperkalemia.  Was stable on room air and feel that this is likely prerenal in the setting of her decreased p.o. intake.  Patient was ordered for an additional liter of fluids and had a CK that was sent off as well.  Discussed with the hospitalist Dr Allena Katz who would like for a CT stone protocol to be performed which was ordered.  Patient was then admitted to medicine for further management of her AKI. [RP]    Clinical Course User Index [RP] Rondel Baton, MD [VK] Rexford Maus, DO   Medical Decision Making Amount and/or Complexity of Data Reviewed Labs: ordered. Radiology: ordered.  Risk OTC drugs. Prescription drug management. Decision regarding hospitalization.          Rondel Baton, MD 05/29/22 [phone]

## 2022-05-29 NOTE — H&P (Signed)
History and Physical    Casey [name] ZOX:[id number] DOB: Jan 19, 1936 DOA: 05/29/2022  PCP: Ardith Dark, MD  Patient coming from: Home  I have personally briefly reviewed patient's old medical records in [facility]  Chief Complaint: Fatigue, poor oral intake  HPI: Casey [name] is a 87 y.o. female with medical history significant for T2DM, HTN, HLD, PUD, anxiety, mild cognitive impairment who presented to the ED for evaluation of generalized weakness and low appetite with poor oral intake.  Patient states that she has had no appetite and no oral intake the last 3 days.  She has been having abdominal pain mostly in her left abdomen radiating to her epigastric region.  She has not had any nausea, vomiting, dyspnea.  She has been feeling constipated and reports last bowel movement 2 days ago.  She says she has been having good urine output without dysuria.  She reports having swelling to her legs "off-and-on" but not today.  She lives alone.  ED Course  Labs/Imaging on admission: I have personally reviewed following labs and imaging studies.  Initial vitals showed BP 94/55, pulse 86, RR 13, temp 98.1 F, SpO2 100% on room air.  Labs show BUN 87, creatinine 7.04 (1.35 on 05/12/2022 with baseline 1-1.3), sodium 135, potassium 4.5, bicarb 15, serum glucose 132, LFTs within normal limits, troponin 7, lipase 38, WBC 8.8, hemoglobin 12.8, platelets 333,000.  Chest and abdominal x-ray negative for acute cardiopulmonary abdominal abnormality.  CT renal stone study negative for evidence of acute intra-abdominal or pelvic abnormality.  Negative for hydronephrosis.  Peripherally calcified thick-walled cystic lesion at the mid pole of the right kidney measuring up to 3.6 cm seen but not adequately characterized without contrast.  Small nonobstructing left kidney stone noted.  Patient was given 1 L normal saline and 1 L LR, IV Pepcid 20 mg.  Bladder scan showed 171 mL per nursing documentation.   The hospitalist service was consulted to admit for further evaluation and management.  Review of Systems: All systems reviewed and are negative except as documented in history of present illness above.   Past Medical History:  Diagnosis Date   ALLERGIC RHINITIS    Allergy    ANXIETY    ARM PAIN, RIGHT    BENIGN POSITIONAL VERTIGO    Carcinoma in situ of colon    CONSTIPATION    CYSTOCELE WITH INCOMPLETE UTERINE PROLAPSE    Diabetes mellitus without complication (HCC)    ESOPHAGEAL STRICTURE    FATIGUE    FLANK PAIN, LEFT    Headache(784.0)    HIP PAIN, LEFT    HYPERCHOLESTEROLEMIA    HYPERGLYCEMIA    HYPERTENSION, BENIGN ESSENTIAL 03/03/2007   LEG EDEMA, BILATERAL    Microscopic hematuria    OBESITY    OSTEOARTHRITIS, KNEE    OSTEOPOROSIS    Peripheral neuropathy, idiopathic    Problems with swallowing and mastication    SINUSITIS, RECURRENT    SPRAIN&STRAIN OTH SPEC SITES SHOULDER&UPPER ARM     Past Surgical History:  Procedure Laterality Date   BIOPSY  02/06/2022   Procedure: BIOPSY;  Surgeon: Kathi Der, MD;  Location: [facility];  Service: Gastroenterology;;   COLONOSCOPY     ESOPHAGOGASTRODUODENOSCOPY (EGD) WITH PROPOFOL N/A 02/06/2022   Procedure: ESOPHAGOGASTRODUODENOSCOPY (EGD) WITH PROPOFOL;  Surgeon: Kathi Der, MD;  Location: [facility];  Service: Gastroenterology;  Laterality: N/A;   KNEE ARTHROPLASTY Right 07/24/2020   Procedure: COMPUTER ASSISTED TOTAL KNEE ARTHROPLASTY;  Surgeon: Samson Frederic, MD;  Location:  [facility] ORS;  Service: Orthopedics;  Laterality: Right;   LEFT HEART CATHETERIZATION WITH CORONARY ANGIOGRAM N/A 12/02/2010   Procedure: LEFT HEART CATHETERIZATION WITH CORONARY ANGIOGRAM;  Surgeon: Peter M [place]az[place], MD;  Location: [facility] C[facility] LAB;  Service: Cardiovascular;:: NORMAL CORONARY ARTERIES - NORMAL LV FUNCTION & EDP.     TRANSTHORACIC ECHOCARDIOGRAM  11/2010   Normal EF 60-65%. No RWMA.  Mild LAD dilation c/w Gr 1 DD.  -- PRETTY  NORMAL ECHO    Social History:  reports that she has never smoked. She has never used smokeless tobacco. She reports that she does not drink alcohol and does not use drugs.  No Known Allergies  Family History  Problem Relation Age of Onset   Coronary artery disease Mother 52   Cancer Father 71   Heart disease Father    Colon cancer Other    Diabetes Other    Coronary artery disease Sister    Diabetes type II Sister    Cancer Sister    Coronary artery disease Brother    Kidney disease Brother    Coronary artery disease Brother    Breast cancer Neg Hx      Prior to Admission medications   Medication Sig Start Date End Date Taking? Authorizing Provider  aspirin 81 MG chewable tablet Chew 1 tablet (81 mg total) by mouth 2 (two) times daily. 02/11/22   [place], Eric J, DO  atorvastatin (LIPITOR) 10 MG tablet Take 1 tablet (10 mg total) by mouth daily. 08/19/21   [name], NP  azelastine (ASTELIN) 0.1 % nasal spray INSTILL 1 SPRAY INTO BOTH NOSTRILS TWICE DAILY Patient taking differently: Place 1 spray into both nostrils 2 (two) times daily. 08/19/21   [name], NP  azelastine (OPTIVAR) 0.05 % ophthalmic solution Place 1 drop into both eyes 2 (two) times daily.    [provider]  busPIRone (BUSPAR) 5 MG tablet Take 1 tablet (5 mg total) by mouth 2 (two) times daily. 05/12/22   Ardith Dark, MD  Carboxymethylcellul-Glycerin (REFRESH OPTIVE) 1-0.9 % GEL Place 1 drop into both eyes daily.    [provider]  empagliflozin (JARDIANCE) 10 MG TABS tablet TAKE 1 TABLET(10 MG) BY MOUTH DAILY BEFORE BREAKFAST 05/12/22   Ardith Dark, MD  HYDROcodone-acetaminophen (NORCO/VICODIN) 5-325 MG tablet Take 1 tablet by mouth every 8 (eight) hours as needed for moderate pain. For chronic pain 05/22/22   Ardith Dark, MD  hydrOXYzine (ATARAX) 10 MG tablet Take 1-2 tablets (10-20 mg total) by mouth 3 (three) times daily as needed. Patient taking differently: Take 10-20 mg  by mouth 3 (three) times daily as needed for anxiety. 04/29/22   Ardith Dark, MD  lisinopril (ZESTRIL) 40 MG tablet Take 40 mg by mouth daily.    [provider]  magnesium chloride (SLOW-MAG) 64 MG TBEC SR tablet Take 1 tablet (64 mg total) by mouth 2 (two) times daily. 05/04/22   Dione Booze, MD  meclizine (ANTIVERT) 25 MG tablet Take 1 tablet (25 mg total) by mouth 3 (three) times daily as needed for dizziness. 03/23/22   Ardith Dark, MD  mirtazapine (REMERON) 7.5 MG tablet Take 1 tablet (7.5 mg total) by mouth at bedtime. 04/21/22   Dohmeier, Porfirio Mylar, MD  ondansetron (ZOFRAN-ODT) 8 MG disintegrating tablet Take 1 tablet (8 mg total) by mouth every 8 (eight) hours as needed for nausea or vomiting. 05/04/22   Dione Booze, MD  pantoprazole (PROTONIX) 40 MG tablet Take 1 tablet (40 mg  total) by mouth 2 (two) times daily for 28 days, THEN 1 tablet (40 mg total) daily. Patient taking differently: Take 1 tablet (40mg ) by mouth daily 02/07/22 06/04/22  [place], Alvira Philips, DO  potassium chloride SA (KLOR-CON M) 20 MEQ tablet Take one tablet twice a day for the next two weeks, then take one tablet every day 05/04/22   Dione Booze, MD  QUEtiapine (SEROQUEL XR) 50 MG TB24 24 hr tablet Take 2 tablets (100 mg total) by mouth at bedtime. Patient taking differently: Take 50 mg by mouth at bedtime. 03/23/22   Ardith Dark, MD  rivastigmine (EXELON) 4.6 mg/24hr Place 1 patch (4.6 mg total) onto the skin daily. 04/21/22   Dohmeier, Porfirio Mylar, MD  sucralfate (CARAFATE) 1 GM/10ML suspension Take 10 mLs (1 g total) by mouth 4 (four) times daily -  with meals and at bedtime. 04/12/22   Arthor Captain, PA-C    Physical Exam: Vitals:   05/29/22 1815 05/29/22 1830 05/29/22 1845 05/29/22 1929  BP: (!) 124/54 120/60 (!) 140/62 133/72  Pulse: (!) 42 (!) 102 (!) 46 73  Resp: 16 20 13 16   Temp:    97.8 F (36.6 C)  TempSrc:    Oral  SpO2: (!) 21% (!) 50% (!) 32% 100%  Weight:      Height:       Constitutional:  Sitting up in bed, NAD, calm, comfortable Eyes: EOMI, lids and conjunctivae normal ENMT: Mucous membranes are dry. Posterior pharynx clear of any exudate or lesions.poor dentition.  Neck: normal, supple, no masses. Respiratory: clear to auscultation bilaterally, no wheezing, no crackles. Normal respiratory effort. No accessory muscle use.  Cardiovascular: Regular rate and rhythm, no murmurs / rubs / gallops. No extremity edema. 2+ pedal pulses. Abdomen: Mild epigastric tenderness with palpation, no masses palpated. Musculoskeletal: no clubbing / cyanosis. No joint deformity upper and lower extremities. Good ROM, no contractures. Normal muscle tone.  Skin: no rashes, lesions, ulcers. No induration Neurologic: Sensation intact. Strength 5/5 in all 4.  Psychiatric: Alert and oriented x 3. Normal mood.   EKG: Personally reviewed. Sinus rhythm, rate 86, QTc 494.  Previous EKG showed sinus rhythm, rate 60, low voltage, QTc 544.  Assessment/Plan Principal Problem:   Acute kidney injury (HCC) Active Problems:   Dyslipidemia associated with type 2 diabetes mellitus (HCC)   GAD (generalized anxiety disorder)   Hypertension associated with diabetes (HCC)   Type 2 diabetes mellitus with microalbuminuria, without long-term current use of insulin (HCC)   PUD (peptic ulcer disease)   Right renal mass   Casey [name] is a 87 y.o. female with medical history significant for T2DM, HTN, HLD, PUD, anxiety, mild cognitive impairment who is admitted with acute kidney injury.  Assessment and Plan: Acute kidney injury: BUN 87 with creatinine 7.04 on admission compared to recent baseline 1.0-1.3.  UA is negative for UTI.  CT renal stone study negative for hydronephrosis or obstruction.  Suspect prerenal due to poor oral intake and medication effect as etiology.  Mentating well on admission. -Continue IV fluid hydration overnight -Follow urine sodium and creatinine -Strict I/O's, bladder scan and in and out  cath as needed -Hold lisinopril and Jardiance  Type 2 diabetes: Holding Jardiance.  Placed on SSI.  Hypertension: BP is stable.  Holding lisinopril due to AKI.  Hyperlipidemia: Continue atorvastatin.  Peptic ulcer disease: Continue Protonix.  Depression/anxiety: Continue Seroquel, Remeron, BuSpar, Atarax as needed.  Mild cognitive impairment: Stable, mentating well on admission.  Right renal mass:  Patient with known right renal cystic lesion measuring up to 3.6 cm.  Noncontrast CT shows short-term stability compared to recent ultrasound.  Outpatient MRI abdomen w/wo contrast per PCP when renal function allows.   DVT prophylaxis: heparin injection 5,000 Units Start: 05/29/22 2200 Code Status: Full code, confirmed with patient on admission Family Communication: Discussed with patient, she has discussed with family Disposition Plan: From home and likely discharge to home pending clinical progress Consults called: None Severity of Illness: The appropriate patient status for this patient is INPATIENT. Inpatient status is judged to be reasonable and necessary in order to provide the required intensity of service to ensure the patient's safety. The patient's presenting symptoms, physical exam findings, and initial radiographic and laboratory data in the context of their chronic comorbidities is felt to place them at high risk for further clinical deterioration. Furthermore, it is not anticipated that the patient will be medically stable for discharge from the hospital within 2 midnights of admission.   * I certify that at the point of admission it is my clinical judgment that the patient will require inpatient hospital care spanning beyond 2 midnights from the point of admission due to high intensity of service, high risk for further deterioration and high frequency of surveillance required.Darreld Mclean MD [facility]  If 7PM-7AM, please contact  night-coverage [email or web address]  05/29/2022, 9:04 PM

## 2022-05-29 NOTE — ED Provider Notes (Signed)
[facility] EMERGENCY DEPARTMENT AT [facility] Provider Note   CSN: [id number] Arrival date & time: 05/29/22  1528     History  Chief Complaint  Patient presents with  . Chest Pain  . Constipation    Casey [name] is a 87 y.o. female.  Patient is an 87 year old female with a past medical history of hypertension, diabetes presenting to the emergency department with generalized weakness and decreased appetite.  The patient states over the last week she has been feeling weak and fatigued.  She states that she has associated dizziness and lightheadedness.  She states that she has been having on and off chest pain that feels like a sharp burning pain from her epigastrium into her chest.  She denies any nausea or vomiting, fevers or chills, cough or shortness of breath.  She states that she has had a decreased appetite and has hardly eaten anything in the last few days.  She states that she has also been straining for bowel movements the last bowel movement was yesterday.  She denies any dysuria or hematuria.  The history is provided by the patient.  Chest Pain Constipation      Home Medications Prior to Admission medications   Medication Sig Start Date End Date Taking? Authorizing Provider  aspirin 81 MG chewable tablet Chew 1 tablet (81 mg total) by mouth 2 (two) times daily. 02/11/22   [place], Eric J, DO  atorvastatin (LIPITOR) 10 MG tablet Take 1 tablet (10 mg total) by mouth daily. 08/19/21   [name], NP  azelastine (ASTELIN) 0.1 % nasal spray INSTILL 1 SPRAY INTO BOTH NOSTRILS TWICE DAILY Patient taking differently: Place 1 spray into both nostrils 2 (two) times daily. 08/19/21   [name], NP  azelastine (OPTIVAR) 0.05 % ophthalmic solution Place 1 drop into both eyes 2 (two) times daily.    [provider]  busPIRone (BUSPAR) 5 MG tablet Take 1 tablet (5 mg total) by mouth 2 (two) times daily. 05/12/22   Ardith Dark, MD   Carboxymethylcellul-Glycerin (REFRESH OPTIVE) 1-0.9 % GEL Place 1 drop into both eyes daily.    [provider]  empagliflozin (JARDIANCE) 10 MG TABS tablet TAKE 1 TABLET(10 MG) BY MOUTH DAILY BEFORE BREAKFAST 05/12/22   Ardith Dark, MD  HYDROcodone-acetaminophen (NORCO/VICODIN) 5-325 MG tablet Take 1 tablet by mouth every 8 (eight) hours as needed for moderate pain. For chronic pain 05/22/22   Ardith Dark, MD  hydrOXYzine (ATARAX) 10 MG tablet Take 1-2 tablets (10-20 mg total) by mouth 3 (three) times daily as needed. Patient taking differently: Take 10-20 mg by mouth 3 (three) times daily as needed for anxiety. 04/29/22   Ardith Dark, MD  lisinopril (ZESTRIL) 40 MG tablet Take 40 mg by mouth daily.    [provider]  magnesium chloride (SLOW-MAG) 64 MG TBEC SR tablet Take 1 tablet (64 mg total) by mouth 2 (two) times daily. 05/04/22   Dione Booze, MD  meclizine (ANTIVERT) 25 MG tablet Take 1 tablet (25 mg total) by mouth 3 (three) times daily as needed for dizziness. 03/23/22   Ardith Dark, MD  mirtazapine (REMERON) 7.5 MG tablet Take 1 tablet (7.5 mg total) by mouth at bedtime. 04/21/22   Dohmeier, Porfirio Mylar, MD  ondansetron (ZOFRAN-ODT) 8 MG disintegrating tablet Take 1 tablet (8 mg total) by mouth every 8 (eight) hours as needed for nausea or vomiting. 05/04/22   Dione Booze, MD  pantoprazole (PROTONIX) 40 MG tablet Take  1 tablet (40 mg total) by mouth 2 (two) times daily for 28 days, THEN 1 tablet (40 mg total) daily. Patient taking differently: Take 1 tablet (40mg ) by mouth daily 02/07/22 06/04/22  [place], Alvira Philips, DO  potassium chloride SA (KLOR-CON M) 20 MEQ tablet Take one tablet twice a day for the next two weeks, then take one tablet every day 05/04/22   Dione Booze, MD  QUEtiapine (SEROQUEL XR) 50 MG TB24 24 hr tablet Take 2 tablets (100 mg total) by mouth at bedtime. Patient taking differently: Take 50 mg by mouth at bedtime. 03/23/22   Ardith Dark, MD   rivastigmine (EXELON) 4.6 mg/24hr Place 1 patch (4.6 mg total) onto the skin daily. 04/21/22   Dohmeier, Porfirio Mylar, MD  sucralfate (CARAFATE) 1 GM/10ML suspension Take 10 mLs (1 g total) by mouth 4 (four) times daily -  with meals and at bedtime. 04/12/22   Arthor Captain, PA-C      Allergies    Patient has no known allergies.    Review of Systems   Review of Systems  Cardiovascular:  Positive for chest pain.  Gastrointestinal:  Positive for constipation.    Physical Exam Updated Vital Signs BP (!) 94/55 (BP Location: Left Arm)   Pulse 86   Temp 98.1 F (36.7 C) (Oral)   Resp 13   SpO2 100%  Physical Exam Vitals and nursing note reviewed.  Constitutional:      General: She is not in acute distress.    Appearance: She is well-developed.  HENT:     Head: Normocephalic and atraumatic.  Eyes:     Extraocular Movements: Extraocular movements intact.  Cardiovascular:     Rate and Rhythm: Normal rate and regular rhythm.     Pulses:          Radial pulses are 2+ on the right side and 2+ on the left side.     Heart sounds: Normal heart sounds.  Pulmonary:     Effort: Pulmonary effort is normal.     Breath sounds: Normal breath sounds.  Abdominal:     Palpations: Abdomen is soft.     Tenderness: There is abdominal tenderness (minimal epigastric). There is no guarding or rebound.  Musculoskeletal:        General: Normal range of motion.     Cervical back: Normal range of motion and neck supple.     Right lower leg: No edema.     Left lower leg: No edema.  Skin:    General: Skin is warm and dry.  Neurological:     General: No focal deficit present.     Mental Status: She is alert and oriented to person, place, and time.     Cranial Nerves: No cranial nerve deficit.     Motor: No weakness.     Comments: Normal finger to nose bilaterally  Psychiatric:        Mood and Affect: Mood normal.        Behavior: Behavior normal.     ED Results / Procedures / Treatments    Labs (all labs ordered are listed, but only abnormal results are displayed) Labs Reviewed  CBC WITH DIFFERENTIAL/PLATELET  COMPREHENSIVE METABOLIC PANEL  URINALYSIS, ROUTINE W REFLEX MICROSCOPIC  LIPASE, BLOOD  TROPONIN I (HIGH SENSITIVITY)    EKG EKG Interpretation  Date/Time:  Friday May 29 2022 15:29:35 EDT Ventricular Rate:  86 PR Interval:  141 QRS Duration: 104 QT Interval:  413 QTC Calculation: 494 R Axis:   -  44 Text Interpretation: Sinus rhythm Left axis deviation ST elevation, consider inferior injury Borderline prolonged QT interval No significant change since last tracing Confirmed by Elayne Snare ([id number]) on 05/29/2022 3:33:05 PM  Radiology No results found.  Procedures Procedures    Medications Ordered in ED Medications  sodium chloride 0.9 % bolus 1,000 mL (has no administration in time range)  famotidine (PEPCID) IVPB 20 mg premix (has no administration in time range)  alum & mag hydroxide-simeth (MAALOX/MYLANTA) 200-200-20 MG/5ML suspension 30 mL (has no administration in time range)    ED Course/ Medical Decision Making/ A&P Clinical Course as of 05/29/22 1604  Fri May 29, 2022  1604 Patient signed out to Dr. Jarold Motto pending labs, XR and reassessment [VK]    Clinical Course User Index [VK] Rexford Maus, DO                             Medical Decision Making This patient presents to the ED with chief complaint(s) of weakness, chest pain with pertinent past medical history of HTN, DM which further complicates the presenting complaint. The complaint involves an extensive differential diagnosis and also carries with it a high risk of complications and morbidity.    The differential diagnosis includes ACS, arrhythmia, anemia, dehydration, electrolyte abnormality, constipation, infection  Additional history obtained: Additional history obtained from N/A Records reviewed Primary Care Documents  ED Course and Reassessment: On patient's  arrival to the emergency department she had initially mildly soft blood pressures in the 90s and otherwise hemodynamically stable.  EKG on arrival showed normal sinus rhythm.  She has no focal neurologic deficits on exam making CVA unlikely.  Patient will have labs, x-ray, fluids and GI cocktail and will be closely reassessed.     Amount and/or Complexity of Data Reviewed Labs: ordered. Radiology: ordered.  Risk OTC drugs. Prescription drug management.           Final Clinical Impression(s) / ED Diagnoses Final diagnoses:  None    Rx / DC Orders ED Discharge Orders     None         Rexford Maus, DO 05/29/22 1604

## 2022-05-29 NOTE — ED Triage Notes (Signed)
Pt arrives via [facility] from home after neighbors called a welfare check for not seeing pt in several days. On arrival pt c/o malaise, chest pain, constipation, poor PO intake, and inability to care for herself generally.

## 2022-05-29 NOTE — ED Notes (Signed)
Bladder scan performed at 1940. 171 mL found. Pt assited to the restroom. Urine sample obtained

## 2022-05-30 DIAGNOSIS — E1159 Type 2 diabetes mellitus with other circulatory complications: Secondary | ICD-10-CM

## 2022-05-30 DIAGNOSIS — N179 Acute kidney failure, unspecified: Secondary | ICD-10-CM

## 2022-05-30 DIAGNOSIS — R809 Proteinuria, unspecified: Secondary | ICD-10-CM

## 2022-05-30 DIAGNOSIS — F411 Generalized anxiety disorder: Secondary | ICD-10-CM

## 2022-05-30 DIAGNOSIS — E785 Hyperlipidemia, unspecified: Secondary | ICD-10-CM

## 2022-05-30 DIAGNOSIS — E1129 Type 2 diabetes mellitus with other diabetic kidney complication: Secondary | ICD-10-CM

## 2022-05-30 DIAGNOSIS — I152 Hypertension secondary to endocrine disorders: Secondary | ICD-10-CM

## 2022-05-30 DIAGNOSIS — E1169 Type 2 diabetes mellitus with other specified complication: Secondary | ICD-10-CM

## 2022-05-30 DIAGNOSIS — N2889 Other specified disorders of kidney and ureter: Secondary | ICD-10-CM

## 2022-05-30 DIAGNOSIS — K279 Peptic ulcer, site unspecified, unspecified as acute or chronic, without hemorrhage or perforation: Secondary | ICD-10-CM

## 2022-05-30 DIAGNOSIS — G629 Polyneuropathy, unspecified: Secondary | ICD-10-CM

## 2022-05-30 LAB — CBC
HCT: 33.9 % — ABNORMAL LOW (ref 36.0–46.0)
Hemoglobin: 10.9 g/dL — ABNORMAL LOW (ref 12.0–15.0)
MCH: 27.7 pg (ref 26.0–34.0)
MCHC: 32.2 g/dL (ref 30.0–36.0)
MCV: 86.3 fL (ref 80.0–100.0)
Platelets: 287 10*3/uL (ref 150–400)
RBC: 3.93 MIL/uL (ref 3.87–5.11)
RDW: 15.3 % (ref 11.5–15.5)
WBC: 7 10*3/uL (ref 4.0–10.5)
nRBC: 0 % (ref 0.0–0.2)

## 2022-05-30 LAB — BASIC METABOLIC PANEL
Anion gap: 13 (ref 5–15)
BUN: 75 mg/dL — ABNORMAL HIGH (ref 8–23)
CO2: 19 mmol/L — ABNORMAL LOW (ref 22–32)
Calcium: 9 mg/dL (ref 8.9–10.3)
Chloride: 104 mmol/L (ref 98–111)
Creatinine, Ser: 4.2 mg/dL — ABNORMAL HIGH (ref 0.44–1.00)
GFR, Estimated: 10 mL/min — ABNORMAL LOW (ref >60)
Glucose, Bld: 118 mg/dL — ABNORMAL HIGH (ref 70–99)
Potassium: 4.2 mmol/L (ref 3.5–5.1)
Sodium: 136 mmol/L (ref 135–145)

## 2022-05-30 LAB — GLUCOSE, CAPILLARY
Glucose-Capillary: 99 mg/dL (ref 70–99)
Glucose-Capillary: 178 mg/dL — ABNORMAL HIGH (ref 70–99)
Glucose-Capillary: 161 mg/dL — ABNORMAL HIGH (ref 70–99)
Glucose-Capillary: 187 mg/dL — ABNORMAL HIGH (ref 70–99)

## 2022-05-30 MED ORDER — POLYETHYLENE GLYCOL 3350 17 G PO PACK
17.0000 g | PACK | Freq: Every day | ORAL | Status: DC
  Administered 2022-05-30 – 2022-06-02 (×4): 17 g via ORAL
  Filled 2022-05-30 (×4): qty 1

## 2022-05-30 MED ORDER — GABAPENTIN 100 MG PO CAPS
100.0000 mg | ORAL_CAPSULE | Freq: Three times a day (TID) | ORAL | Status: DC
  Administered 2022-05-30 – 2022-06-02 (×9): 100 mg via ORAL
  Filled 2022-05-30 (×9): qty 1

## 2022-05-30 MED ORDER — SODIUM CHLORIDE 0.9 % IV SOLN: INTRAVENOUS | Status: DC

## 2022-05-30 NOTE — Progress Notes (Signed)
DONNA LEATHERWOOD GNF:[id number] DOB: September 26, 1935 DOA: 05/29/2022 PCP: Ardith Dark, MD   Subj: Casey [name] is a 87 y.o. BF PMHx DM type II, HTN, HLD, PUD, anxiety, mild cognitive impairment   Presented to the ED for evaluation of generalized weakness and low appetite with poor oral intake.   Patient states that she has had no appetite and no oral intake the last 3 days.  She has been having abdominal pain mostly in her left abdomen radiating to her epigastric region.  She has not had any nausea, vomiting, dyspnea.  She has been feeling constipated and reports last bowel movement 2 days ago.  She says she has been having good urine output without dysuria.  She reports having swelling to her legs "off-and-on" but not today.  She lives alone.   Obj: A/O x 4, negative CP, negative SOB.  Positive waxing and waning polyneuropathy (legs and hands) chronic.  States having constipation, positive right kidney pain.   Objective: VITAL SIGNS: Temp: 98.5 F (36.9 C) (05/10 2137) Temp Source: Oral (05/10 2137) BP: 86/39 (05/11 0529) Pulse Rate: 63 (05/11 0529)   VENTILATOR SETTINGS: **  Procedures/Significant Events:    Consultants:     Cultures   Antimicrobials: Anti-infectives (From admission, onward)    None        Intake/Output Summary (Last 24 hours) at 05/30/2022 0850 Last data filed at 05/30/2022 0602 Gross per 24 hour  Intake 2910.8 ml  Output --  Net 2910.8 ml     Exam: Physical Exam:  General: A/O x 4, No acute respiratory distress Eyes: negative scleral hemorrhage, negative anisocoria, negative icterus ENT: Negative Runny nose, negative gingival bleeding, Neck:  Negative scars, masses, torticollis, lymphadenopathy, JVD Lungs: Clear to auscultation bilaterally without wheezes or crackles Cardiovascular: Regular rate and rhythm without murmur gallop or rub normal S1 and S2 Abdomen: negative abdominal pain, nondistended, positive soft, bowel sounds, no  rebound, no ascites, no appreciable mass, positive LEFT CVA tenderness to palpation Extremities: No significant cyanosis, clubbing, or edema bilateral lower extremities Skin: Negative rashes, lesions, ulcers Psychiatric:  Negative depression, negative anxiety, negative fatigue, negative mania  Central nervous system:  Cranial nerves II through XII intact, tongue/uvula midline, all extremities muscle strength 5/5, sensation intact throughout, negative dysarthria, negative expressive aphasia, negative receptive aphasia.   .   DVT prophylaxis: Subcu heparin Code Status: Full Family Communication:  Status is: Inpatient    Dispo: The patient is from: Home              Anticipated d/c is to: Home              Anticipated d/c date is: > 3 days              Patient currently is not medically stable to d/c.      Assessment & Plan: Covid vaccination;   Principal Problem:   Acute kidney injury (HCC) Active Problems:   Dyslipidemia associated with type 2 diabetes mellitus (HCC)   GAD (generalized anxiety disorder)   Hypertension associated with diabetes (HCC)   Type 2 diabetes mellitus with microalbuminuria, without long-term current use of insulin (HCC)   PUD (peptic ulcer disease)   Right renal mass   Acute kidney injury: BUN 87 with creatinine 7.04 on admission compared to recent baseline 1.0-1.3.  UA is negative for UTI.  CT renal stone study negative for hydronephrosis or obstruction.  Suspect prerenal due to poor oral intake and medication effect as etiology.  Mentating well on admission. Lab Results  Component Value Date   CREATININE 4.20 (H) 05/30/2022   CREATININE 7.04 (H) 05/29/2022   CREATININE 1.35 (H) 05/12/2022   CREATININE 1.14 (H) 05/04/2022   CREATININE 0.99 04/29/2022  -Normal saline 184ml/hr -Strict in and out - Daily weight -Hold lisinopril  Right renal mass: -Patient with known right renal cystic lesion measuring up to 3.6 cm.   -Noncontrast CT shows  short-term stability compared to recent ultrasound.   -Outpatient MRI abdomen w/wo contrast per PCP when renal function allows.    Type 2 diabetes: Holding Jardiance.  Placed on SSI. -5/11 hemoglobin A1c pending -5/11 lipid panel pending -Hold Jardiance   Hypertension: -Stable without BP medication.   Hyperlipidemia: -Lipitor 10 mg daily -Lipid panel pending   Peptic ulcer disease: -Protonix.  40 mg daily  Constipation - 5/11 MiraLAX 17 g daily   Depression/anxiety: - BuSpar 5 mg BID -Remeron 7.5 mg daily -Seroquel 50 mg daily  Chronic polyneuropathy -5/11 Gabapentin 100 mg tid   Mild cognitive impairment: -Stable, mentating well on admission.    Mobility Assessment (last 72 hours)     Mobility Assessment     Row Name 05/29/22 2316           Does patient have an order for bedrest or is patient medically unstable No - Continue assessment                     Time: 50 minutes         Care during the described time interval was provided by me .  I have reviewed this patient's available data, including medical history, events of note, physical examination, and all test results as part of my evaluation.

## 2022-05-31 DIAGNOSIS — G629 Polyneuropathy, unspecified: Secondary | ICD-10-CM | POA: Diagnosis not present

## 2022-05-31 DIAGNOSIS — F411 Generalized anxiety disorder: Secondary | ICD-10-CM | POA: Diagnosis not present

## 2022-05-31 DIAGNOSIS — E1169 Type 2 diabetes mellitus with other specified complication: Secondary | ICD-10-CM | POA: Diagnosis not present

## 2022-05-31 DIAGNOSIS — N179 Acute kidney failure, unspecified: Secondary | ICD-10-CM | POA: Diagnosis not present

## 2022-05-31 LAB — CBC WITH DIFFERENTIAL/PLATELET
Abs Immature Granulocytes: 0.01 10*3/uL (ref 0.00–0.07)
Basophils Absolute: 0.1 10*3/uL (ref 0.0–0.1)
Basophils Relative: 1 %
Eosinophils Absolute: 0.1 10*3/uL (ref 0.0–0.5)
Eosinophils Relative: 2 %
HCT: 33.3 % — ABNORMAL LOW (ref 36.0–46.0)
Hemoglobin: 10.8 g/dL — ABNORMAL LOW (ref 12.0–15.0)
Immature Granulocytes: 0 %
Lymphocytes Relative: 52 %
Lymphs Abs: 3.6 10*3/uL (ref 0.7–4.0)
MCH: 28 pg (ref 26.0–34.0)
MCHC: 32.4 g/dL (ref 30.0–36.0)
MCV: 86.3 fL (ref 80.0–100.0)
Monocytes Absolute: 0.6 10*3/uL (ref 0.1–1.0)
Monocytes Relative: 8 %
Neutro Abs: 2.5 10*3/uL (ref 1.7–7.7)
Neutrophils Relative %: 37 %
Platelets: 262 10*3/uL (ref 150–400)
RBC: 3.86 MIL/uL — ABNORMAL LOW (ref 3.87–5.11)
RDW: 15.6 % — ABNORMAL HIGH (ref 11.5–15.5)
WBC: 6.9 10*3/uL (ref 4.0–10.5)
nRBC: 0 % (ref 0.0–0.2)

## 2022-05-31 LAB — GLUCOSE, CAPILLARY
Glucose-Capillary: 127 mg/dL — ABNORMAL HIGH (ref 70–99)
Glucose-Capillary: 192 mg/dL — ABNORMAL HIGH (ref 70–99)
Glucose-Capillary: 162 mg/dL — ABNORMAL HIGH (ref 70–99)
Glucose-Capillary: 203 mg/dL — ABNORMAL HIGH (ref 70–99)

## 2022-05-31 LAB — COMPREHENSIVE METABOLIC PANEL
ALT: 14 U/L (ref 0–44)
AST: 17 U/L (ref 15–41)
Albumin: 3 g/dL — ABNORMAL LOW (ref 3.5–5.0)
Alkaline Phosphatase: 62 U/L (ref 38–126)
Anion gap: 9 (ref 5–15)
BUN: 56 mg/dL — ABNORMAL HIGH (ref 8–23)
CO2: 21 mmol/L — ABNORMAL LOW (ref 22–32)
Calcium: 8.8 mg/dL — ABNORMAL LOW (ref 8.9–10.3)
Chloride: 111 mmol/L (ref 98–111)
Creatinine, Ser: 1.78 mg/dL — ABNORMAL HIGH (ref 0.44–1.00)
GFR, Estimated: 28 mL/min — ABNORMAL LOW (ref >60)
Glucose, Bld: 119 mg/dL — ABNORMAL HIGH (ref 70–99)
Potassium: 3.9 mmol/L (ref 3.5–5.1)
Sodium: 141 mmol/L (ref 135–145)
Total Bilirubin: 0.4 mg/dL (ref 0.3–1.2)
Total Protein: 5.7 g/dL — ABNORMAL LOW (ref 6.5–8.1)

## 2022-05-31 LAB — LIPID PANEL
Cholesterol: 172 mg/dL (ref 0–200)
HDL: 44 mg/dL (ref >40)
LDL Cholesterol: 111 mg/dL — ABNORMAL HIGH (ref 0–99)
Total CHOL/HDL Ratio: 3.9 RATIO
Triglycerides: 83 mg/dL (ref <150)
VLDL: 17 mg/dL (ref 0–40)

## 2022-05-31 LAB — MAGNESIUM: Magnesium: 2.1 mg/dL (ref 1.7–2.4)

## 2022-05-31 LAB — PHOSPHORUS: Phosphorus: 2.4 mg/dL — ABNORMAL LOW (ref 2.5–4.6)

## 2022-05-31 LAB — HEMOGLOBIN A1C
Hgb A1c MFr Bld: 8 % — ABNORMAL HIGH (ref 4.8–5.6)
Mean Plasma Glucose: 182.9 mg/dL

## 2022-05-31 MED ORDER — ATORVASTATIN CALCIUM 40 MG PO TABS
40.0000 mg | ORAL_TABLET | Freq: Every day | ORAL | Status: DC
  Administered 2022-06-01 – 2022-06-02 (×2): 40 mg via ORAL
  Filled 2022-05-31 (×2): qty 1

## 2022-05-31 MED ORDER — SUMATRIPTAN SUCCINATE 6 MG/0.5ML ~~LOC~~ SOLN
6.0000 mg | Freq: Once | SUBCUTANEOUS | Status: AC
  Administered 2022-05-31: 6 mg via SUBCUTANEOUS
  Filled 2022-05-31 (×2): qty 0.5

## 2022-05-31 NOTE — Progress Notes (Signed)
Casey [name] Casey [name] DOB: November 06, 1935 DOA: 05/29/2022 PCP: Ardith Dark, MD   Subj: Casey [name] is a 87 y.o. BF PMHx DM type II, HTN, HLD, PUD, anxiety, mild cognitive impairment   Presented to the ED for evaluation of generalized weakness and low appetite with poor oral intake.   Patient states that she has had no appetite and no oral intake the last 3 days.  She has been having abdominal pain mostly in her left abdomen radiating to her epigastric region.  She has not had any nausea, vomiting, dyspnea.  She has been feeling constipated and reports last bowel movement 2 days ago.  She says she has been having good urine output without dysuria.  She reports having swelling to her legs "off-and-on" but not today.  She lives alone.   Obj: 5/12 afebrile overnight, A/O x 4, negative CP, negative SOB.  Continue RIGHT kidney pain.    Objective: VITAL SIGNS: Temp: 98 F (36.7 C) (05/12 0812) Temp Source: Oral (05/12 0341) BP: 103/61 (05/12 0812) Pulse Rate: 58 (05/12 0812)   VENTILATOR SETTINGS: **  Procedures/Significant Events:    Consultants:     Cultures   Antimicrobials: Anti-infectives (From admission, onward)    None       No intake or output data in the 24 hours ending 05/31/22 1207   Physical Exam:  General: A/O x 4, No acute respiratory distress Eyes: negative scleral hemorrhage, negative anisocoria, negative icterus ENT: Negative Runny nose, negative gingival bleeding, Neck:  Negative scars, masses, torticollis, lymphadenopathy, JVD Lungs: Clear to auscultation bilaterally without wheezes or crackles Cardiovascular: Regular rate and rhythm without murmur gallop or rub normal S1 and S2 Abdomen: negative abdominal pain, nondistended, positive soft, bowel sounds, no rebound, no ascites, no appreciable mass, positive LEFT CVA tenderness to palpation. Extremities: No significant cyanosis, clubbing, or edema bilateral lower extremities Skin:  Negative rashes, lesions, ulcers Psychiatric:  Negative depression, negative anxiety, negative fatigue, negative mania  Central nervous system:  Cranial nerves II through XII intact, tongue/uvula midline, all extremities muscle strength 5/5, sensation intact throughout, negative dysarthria, negative expressive aphasia, negative receptive aphasia.     DVT prophylaxis: Subcu heparin Code Status: Full Family Communication:  Status is: Inpatient    Dispo: The patient is from: Home              Anticipated d/c is to: Home              Anticipated d/c date is: > 3 days              Patient currently is not medically stable to d/c.      Assessment & Plan: Covid vaccination;   Principal Problem:   Acute kidney injury (HCC) Active Problems:   Dyslipidemia associated with type 2 diabetes mellitus (HCC)   GAD (generalized anxiety disorder)   Hypertension associated with diabetes (HCC)   Type 2 diabetes mellitus with microalbuminuria, without long-term current use of insulin (HCC)   PUD (peptic ulcer disease)   Right renal mass   Chronic polyneuropathy   Acute kidney injury: BUN 87 with creatinine 7.04 on admission compared to recent baseline 1.0-1.3.  UA is negative for UTI.  CT renal stone study negative for hydronephrosis or obstruction.  Suspect prerenal due to poor oral intake and medication effect as etiology.  Mentating well on admission. Lab Results  Component Value Date   CREATININE 1.78 (H) 05/31/2022   CREATININE 4.20 (H) 05/30/2022   CREATININE 7.04 (H)  05/29/2022   CREATININE 1.35 (H) 05/12/2022   CREATININE 1.14 (H) 05/04/2022  -Normal saline 173ml/hr -Strict in and out - Daily weight -Hold lisinopril -5/12 urine culture pending  Right renal mass: -Patient with known right renal cystic lesion measuring up to 3.6 cm.   -Noncontrast CT shows short-term stability compared to recent ultrasound.   -Outpatient MRI abdomen w/wo contrast per PCP when renal function  allows.    Type 2 diabetes: Uncontrolled with hyperglycemia Holding Jardiance.  Placed on SSI. -5/12 hemoglobin A1c = 8.0 -Hold Jardiance   Hypertension: -Stable without BP medication.   Hyperlipidemia: - 5/12 lipid panel= 111, not at goal of<70 -5/12 increase Lipitor 40 mg daily   Peptic ulcer disease: -Protonix.  40 mg daily  Constipation - 5/11 MiraLAX 17 g daily   Depression/anxiety: - BuSpar 5 mg BID -Remeron 7.5 mg daily -Seroquel 50 mg daily  Chronic polyneuropathy -5/11 Gabapentin 100 mg tid   Mild cognitive impairment: -Stable, mentating well on admission.    Mobility Assessment (last 72 hours)     Mobility Assessment     Row Name 05/30/22 2120 05/30/22 1500 05/29/22 2316       Does patient have an order for bedrest or is patient medically unstable -- No - Continue assessment No - Continue assessment     What is the highest level of mobility based on the progressive mobility assessment? Level 5 (Walks with assist in room/hall) - Balance while stepping forward/back and can walk in room with assist - Complete Level 5 (Walks with assist in room/hall) - Balance while stepping forward/back and can walk in room with assist - Complete --                   Time: 50 minutes         Care during the described time interval was provided by me .  I have reviewed this patient's available data, including medical history, events of note, physical examination, and all test results as part of my evaluation.

## 2022-06-01 ENCOUNTER — Inpatient Hospital Stay (HOSPITAL_COMMUNITY): Payer: 59

## 2022-06-01 DIAGNOSIS — N179 Acute kidney failure, unspecified: Secondary | ICD-10-CM | POA: Diagnosis not present

## 2022-06-01 DIAGNOSIS — G629 Polyneuropathy, unspecified: Secondary | ICD-10-CM | POA: Diagnosis not present

## 2022-06-01 DIAGNOSIS — F411 Generalized anxiety disorder: Secondary | ICD-10-CM | POA: Diagnosis not present

## 2022-06-01 DIAGNOSIS — E1169 Type 2 diabetes mellitus with other specified complication: Secondary | ICD-10-CM | POA: Diagnosis not present

## 2022-06-01 LAB — URINALYSIS, W/ REFLEX TO CULTURE (INFECTION SUSPECTED)
Bilirubin Urine: NEGATIVE
Glucose, UA: >=500 mg/dL — AB
Hgb urine dipstick: NEGATIVE
Ketones, ur: NEGATIVE mg/dL
Leukocytes,Ua: NEGATIVE
Nitrite: NEGATIVE
Protein, ur: NEGATIVE mg/dL
Specific Gravity, Urine: 1.016 (ref 1.005–1.030)
pH: 6 (ref 5.0–8.0)

## 2022-06-01 LAB — COMPREHENSIVE METABOLIC PANEL
ALT: 13 U/L (ref 0–44)
AST: 14 U/L — ABNORMAL LOW (ref 15–41)
Albumin: 2.7 g/dL — ABNORMAL LOW (ref 3.5–5.0)
Alkaline Phosphatase: 55 U/L (ref 38–126)
Anion gap: 6 (ref 5–15)
BUN: 34 mg/dL — ABNORMAL HIGH (ref 8–23)
CO2: 21 mmol/L — ABNORMAL LOW (ref 22–32)
Calcium: 8.7 mg/dL — ABNORMAL LOW (ref 8.9–10.3)
Chloride: 114 mmol/L — ABNORMAL HIGH (ref 98–111)
Creatinine, Ser: 1.08 mg/dL — ABNORMAL HIGH (ref 0.44–1.00)
GFR, Estimated: 50 mL/min — ABNORMAL LOW (ref >60)
Glucose, Bld: 132 mg/dL — ABNORMAL HIGH (ref 70–99)
Potassium: 3.9 mmol/L (ref 3.5–5.1)
Sodium: 141 mmol/L (ref 135–145)
Total Bilirubin: 0.4 mg/dL (ref 0.3–1.2)
Total Protein: 5.4 g/dL — ABNORMAL LOW (ref 6.5–8.1)

## 2022-06-01 LAB — CBC WITH DIFFERENTIAL/PLATELET
Abs Immature Granulocytes: 0.03 10*3/uL (ref 0.00–0.07)
Basophils Absolute: 0.1 10*3/uL (ref 0.0–0.1)
Basophils Relative: 1 %
Eosinophils Absolute: 0.2 10*3/uL (ref 0.0–0.5)
Eosinophils Relative: 2 %
HCT: 30.9 % — ABNORMAL LOW (ref 36.0–46.0)
Hemoglobin: 10.1 g/dL — ABNORMAL LOW (ref 12.0–15.0)
Immature Granulocytes: 0 %
Lymphocytes Relative: 55 %
Lymphs Abs: 4 10*3/uL (ref 0.7–4.0)
MCH: 27.7 pg (ref 26.0–34.0)
MCHC: 32.7 g/dL (ref 30.0–36.0)
MCV: 84.9 fL (ref 80.0–100.0)
Monocytes Absolute: 0.6 10*3/uL (ref 0.1–1.0)
Monocytes Relative: 8 %
Neutro Abs: 2.5 10*3/uL (ref 1.7–7.7)
Neutrophils Relative %: 34 %
Platelets: 259 10*3/uL (ref 150–400)
RBC: 3.64 MIL/uL — ABNORMAL LOW (ref 3.87–5.11)
RDW: 15.7 % — ABNORMAL HIGH (ref 11.5–15.5)
WBC: 7.2 10*3/uL (ref 4.0–10.5)
nRBC: 0 % (ref 0.0–0.2)

## 2022-06-01 LAB — PHOSPHORUS: Phosphorus: 1.2 mg/dL — ABNORMAL LOW (ref 2.5–4.6)

## 2022-06-01 LAB — GLUCOSE, CAPILLARY
Glucose-Capillary: 131 mg/dL — ABNORMAL HIGH (ref 70–99)
Glucose-Capillary: 136 mg/dL — ABNORMAL HIGH (ref 70–99)
Glucose-Capillary: 143 mg/dL — ABNORMAL HIGH (ref 70–99)
Glucose-Capillary: 203 mg/dL — ABNORMAL HIGH (ref 70–99)

## 2022-06-01 LAB — MAGNESIUM: Magnesium: 1.8 mg/dL (ref 1.7–2.4)

## 2022-06-01 MED ORDER — GLUCERNA SHAKE PO LIQD
237.0000 mL | Freq: Three times a day (TID) | ORAL | Status: DC
  Administered 2022-06-02 (×2): 237 mL via ORAL

## 2022-06-01 MED ORDER — POTASSIUM PHOSPHATES 15 MMOLE/5ML IV SOLN
15.0000 mmol | Freq: Once | INTRAVENOUS | Status: AC
  Administered 2022-06-01: 15 mmol via INTRAVENOUS
  Filled 2022-06-01: qty 5

## 2022-06-01 MED ORDER — RENA-VITE PO TABS
1.0000 | ORAL_TABLET | Freq: Every day | ORAL | Status: DC
  Administered 2022-06-01: 1 via ORAL
  Filled 2022-06-01: qty 1

## 2022-06-01 MED ORDER — SORBITOL 70 % SOLN
400.0000 mL | TOPICAL_OIL | Freq: Once | ORAL | Status: AC
  Administered 2022-06-01: 400 mL via RECTAL
  Filled 2022-06-01: qty 120

## 2022-06-01 NOTE — Progress Notes (Signed)
Casey [name] ZOX:[id number] DOB: 1935-12-07 DOA: 05/29/2022 PCP: Ardith Dark, MD   Subj: Casey [name] is a 87 y.o. BF PMHx DM type II, HTN, HLD, PUD, anxiety, mild cognitive impairment   Presented to the ED for evaluation of generalized weakness and low appetite with poor oral intake.   Patient states that she has had no appetite and no oral intake the last 3 days.  She has been having abdominal pain mostly in her left abdomen radiating to her epigastric region.  She has not had any nausea, vomiting, dyspnea.  She has been feeling constipated and reports last bowel movement 2 days ago.  She says she has been having good urine output without dysuria.  She reports having swelling to her legs "off-and-on" but not today.  She lives alone.   Obj: 5/13 afebrile overnight A/O x 4, negative CP, negative SOB, continued off Right kidney pain that radiates through her right groin and then up into her chest.   Objective: VITAL SIGNS: Temp: 98.9 F (37.2 C) (05/13 0843) Temp Source: Oral (05/13 0843) BP: 128/71 (05/13 0843) Pulse Rate: 74 (05/13 0843)   VENTILATOR SETTINGS: **  Procedures/Significant Events: 5/10 DG abdomen acute series: Negative abdominal findings 5/10 CT renal stone study No CT evidence for acute intra-abdominal or pelvic abnormality. Negative for hydronephrosis. 2. Peripherally calcified thick walled cystic lesion at the midpole of the right kidney measuring up to 3.6 cm, not adequately characterized without contrast. This has shown short term stability but MRI previously recommended based on renal ultrasound 05/01/2022. When the patient is clinically stable and able to follow directions and hold their breath (preferably as an outpatient) further evaluation with dedicated abdominal MRI should be considered. 3. Small nonobstructing left kidney stone  Consultants:     Cultures 5/10 urine positive moderate blood    Antimicrobials: Anti-infectives (From  admission, onward)    None        Intake/Output Summary (Last 24 hours) at 06/01/2022 0957 Last data filed at 06/01/2022 8119 Gross per 24 hour  Intake 2983.11 ml  Output --  Net 2983.11 ml    Physical Exam:  General: A/O x 4, No acute respiratory distress Eyes: negative scleral hemorrhage, negative anisocoria, negative icterus ENT: Negative Runny nose, negative gingival bleeding, Neck:  Negative scars, masses, torticollis, lymphadenopathy, JVD Lungs: Clear to auscultation bilaterally without wheezes or crackles Cardiovascular: Regular rate and rhythm without murmur gallop or rub normal S1 and S2 Abdomen: negative abdominal pain, nondistended, positive soft, bowel sounds, no rebound, no ascites, no appreciable mass, positive CVA tenderness LEFT Extremities: positive LEFT groin tenderness Skin: Negative rashes, lesions, ulcers Psychiatric:  Negative depression, negative anxiety, negative fatigue, negative mania  Central nervous system:  Cranial nerves II through XII intact, tongue/uvula midline, all extremities muscle strength 5/5, sensation intact throughout,  negative dysarthria, negative expressive aphasia, negative receptive aphasia.      DVT prophylaxis: Subcu heparin Code Status: Full Family Communication:  Status is: Inpatient    Dispo: The patient is from: Home              Anticipated d/c is to: Home              Anticipated d/c date is: > 3 days              Patient currently is not medically stable to d/c.      Assessment & Plan: Covid vaccination;   Principal Problem:   Acute kidney injury (HCC) Active Problems:  Dyslipidemia associated with type 2 diabetes mellitus (HCC)   GAD (generalized anxiety disorder)   Hypertension associated with diabetes (HCC)   Type 2 diabetes mellitus with microalbuminuria, without long-term current use of insulin (HCC)   PUD (peptic ulcer disease)   Right renal mass   Chronic polyneuropathy   Acute kidney  injury: BUN 87 with creatinine 7.04 on admission compared to recent baseline 1.0-1.3.  UA is negative for UTI.  CT renal stone study negative for hydronephrosis or obstruction.  Suspect prerenal due to poor oral intake and medication effect as etiology.  Mentating well on admission. Lab Results  Component Value Date   CREATININE 1.08 (H) 06/01/2022   CREATININE 1.78 (H) 05/31/2022   CREATININE 4.20 (H) 05/30/2022   CREATININE 7.04 (H) 05/29/2022   CREATININE 1.35 (H) 05/12/2022  -Normal saline 167ml/hr -Strict in and out +5.8 L - Daily weight Filed Weights   05/29/22 1610 05/30/22 0600  Weight: 69.9 kg 68.6 kg  -Hold lisinopril -5/12 urine culture pending  Right renal mass: -Patient with known right renal cystic lesion measuring up to 3.6 cm.   -Noncontrast CT shows short-term stability compared to recent ultrasound.   -Outpatient MRI abdomen w/wo contrast per PCP when renal function allows.  Hematuria -5/10 moderate blood in urine. - 5/13 urine culture pending    Type 2 diabetes: Uncontrolled with hyperglycemia Holding Jardiance.  Placed on SSI. -5/12 hemoglobin A1c = 8.0 CBG (last 3)  Recent Labs    05/31/22 2048 06/01/22 0845 06/01/22 1233  GLUCAP 203* 131* 136*  -Hold Jardiance  LEFT CVA tenderness/left hip pain? - 5/13 urine culture pending - 5/13 DG LEFT hip pending   Hypertension: -Stable without BP medication.   Hyperlipidemia: - 5/12 lipid panel= 111, not at goal of<70 -5/12 increase Lipitor 40 mg daily   Peptic ulcer disease: -Protonix.  40 mg daily  Constipation - 5/11 MiraLAX 17 g daily   Depression/anxiety: - BuSpar 5 mg BID -Remeron 7.5 mg daily -Seroquel 50 mg daily  Chronic polyneuropathy -5/11 Gabapentin 100 mg tid   Mild cognitive impairment: -Stable, mentating well on admission  Hypophosphatemia - Phosphorus goal> 2.5 - 5/13 K-Phos.  15 mmol    Mobility Assessment (last 72 hours)     Mobility Assessment     Row Name  05/30/22 2120 05/30/22 1500 05/29/22 2316       Does patient have an order for bedrest or is patient medically unstable -- No - Continue assessment No - Continue assessment     What is the highest level of mobility based on the progressive mobility assessment? Level 5 (Walks with assist in room/hall) - Balance while stepping forward/back and can walk in room with assist - Complete Level 5 (Walks with assist in room/hall) - Balance while stepping forward/back and can walk in room with assist - Complete --                   Time: 50 minutes         Care during the described time interval was provided by me .  I have reviewed this patient's available data, including medical history, events of note, physical examination, and all test results as part of my evaluation.

## 2022-06-01 NOTE — Plan of Care (Signed)
Patient AOX4, tremors at baseline.  VSS throughout shift.  Diminished lungs, IS encouraged.  Pt c/o pain relieved by PRN norco.  Pt ambulated to bathroom to void with steady gait.  POC maintained, will continue to monitor.  Problem: Education: Goal: Ability to describe self-care measures that may prevent or decrease complications (Diabetes Survival Skills Education) will improve Outcome: Progressing Goal: Individualized Educational Video(s) Outcome: Progressing   Problem: Coping: Goal: Ability to adjust to condition or change in health will improve Outcome: Progressing   Problem: Fluid Volume: Goal: Ability to maintain a balanced intake and output will improve Outcome: Progressing   Problem: Health Behavior/Discharge Planning: Goal: Ability to identify and utilize available resources and services will improve Outcome: Progressing Goal: Ability to manage health-related needs will improve Outcome: Progressing   Problem: Metabolic: Goal: Ability to maintain appropriate glucose levels will improve Outcome: Progressing   Problem: Nutritional: Goal: Maintenance of adequate nutrition will improve Outcome: Progressing Goal: Progress toward achieving an optimal weight will improve Outcome: Progressing   Problem: Skin Integrity: Goal: Risk for impaired skin integrity will decrease Outcome: Progressing   Problem: Tissue Perfusion: Goal: Adequacy of tissue perfusion will improve Outcome: Progressing   Problem: Education: Goal: Knowledge of General Education information will improve Description: Including pain rating scale, medication(s)/side effects and non-pharmacologic comfort measures Outcome: Progressing   Problem: Health Behavior/Discharge Planning: Goal: Ability to manage health-related needs will improve Outcome: Progressing   Problem: Clinical Measurements: Goal: Ability to maintain clinical measurements within normal limits will improve Outcome: Progressing Goal: Will  remain free from infection Outcome: Progressing Goal: Diagnostic test results will improve Outcome: Progressing Goal: Respiratory complications will improve Outcome: Progressing Goal: Cardiovascular complication will be avoided Outcome: Progressing   Problem: Activity: Goal: Risk for activity intolerance will decrease Outcome: Progressing   Problem: Nutrition: Goal: Adequate nutrition will be maintained Outcome: Progressing   Problem: Coping: Goal: Level of anxiety will decrease Outcome: Progressing   Problem: Elimination: Goal: Will not experience complications related to bowel motility Outcome: Progressing Goal: Will not experience complications related to urinary retention Outcome: Progressing   Problem: Pain Managment: Goal: General experience of comfort will improve Outcome: Progressing   Problem: Safety: Goal: Ability to remain free from injury will improve Outcome: Progressing   Problem: Skin Integrity: Goal: Risk for impaired skin integrity will decrease Outcome: Progressing

## 2022-06-01 NOTE — Evaluation (Signed)
Occupational Therapy Evaluation Patient Details Name: Casey [name] MRN: [id number] DOB: 04-22-1935 Today's Date: 06/01/2022   History of Present Illness Pt is an 87 y/o F presenting to ED on 5/10 with generalized weakness and decreased appetite. Admitted for AKI. PMH includes HTN, DM, fatigue, obesity, osteoporosis   Clinical Impression   Pt reports independence at baseline with ADLs and has been using RW for functional mobility for the past 2 months. Pt reports she lives alone, but has friends/family available for PRN assist. Pt needing supervision-min A for ADLs, supervision for bed mobility, and supervision for transfers with RW. Pt presenting with impairments listed below, will follow acutely. Recommend HHOT at d/c.       Recommendations for follow up therapy are one component of a multi-disciplinary discharge planning process, led by the attending physician.  Recommendations may be updated based on patient status, additional functional criteria and insurance authorization.   Assistance Recommended at Discharge Intermittent Supervision/Assistance  Patient can return home with the following Assist for transportation;A little help with walking and/or transfers;A little help with bathing/dressing/bathroom;Assistance with cooking/housework    Functional Status Assessment  Patient has had a recent decline in their functional status and demonstrates the ability to make significant improvements in function in a reasonable and predictable amount of time.  Equipment Recommendations  BSC/3in1    Recommendations for Other Services PT consult     Precautions / Restrictions Precautions Precautions: Fall Restrictions Weight Bearing Restrictions: No      Mobility Bed Mobility Overal bed mobility: Needs Assistance Bed Mobility: Supine to Sit     Supine to sit: Supervision          Transfers Overall transfer level: Needs assistance Equipment used: Rolling walker (2  wheels) Transfers: Sit to/from Stand Sit to Stand: Supervision                  Balance Overall balance assessment: Mild deficits observed, not formally tested                                         ADL either performed or assessed with clinical judgement   ADL Overall ADL's : Needs assistance/impaired Eating/Feeding: Modified independent   Grooming: Wash/dry face;Wash/dry hands;Supervision/safety;Standing   Upper Body Bathing: Minimal assistance;Sitting   Lower Body Bathing: Minimal assistance;Sitting/lateral leans   Upper Body Dressing : Min guard   Lower Body Dressing: Min guard   Toilet Transfer: Supervision/safety;Ambulation;Regular Toilet;Rolling walker (2 wheels)   Toileting- Clothing Manipulation and Hygiene: Supervision/safety       Functional mobility during ADLs: Supervision/safety;Rolling walker (2 wheels)       Vision   Vision Assessment?: No apparent visual deficits     Perception Perception Perception Tested?: No   Praxis Praxis Praxis tested?: Not tested    Pertinent Vitals/Pain Pain Assessment Pain Assessment: No/denies pain     Hand Dominance Right   Extremity/Trunk Assessment Upper Extremity Assessment Upper Extremity Assessment: Generalized weakness   Lower Extremity Assessment Lower Extremity Assessment: Generalized weakness   Cervical / Trunk Assessment Cervical / Trunk Assessment: Normal   Communication Communication Communication: No difficulties   Cognition Arousal/Alertness: Awake/alert Behavior During Therapy: WFL for tasks assessed/performed Overall Cognitive Status: Within Functional Limits for tasks assessed  General Comments: A & O x4, aware of why she is at the hospital, follow commands appropriately     General Comments  none stated    Exercises     Shoulder Instructions      Home Living Family/patient expects to be discharged to::  Private residence Living Arrangements: Alone Available Help at Discharge: Friend(s);Family;Available PRN/intermittently (niece) Type of Home: House Home Access: Level entry     Home Layout: One level     Bathroom Shower/Tub: Tub/shower unit;Sponge bathes at baseline   [employer]: Standard     Home Equipment: Agricultural consultant (2 wheels);Cane - single point          Prior Functioning/Environment Prior Level of Function : Needs assist             Mobility Comments: reports she started using the RW about 2 months ago ADLs Comments: friend assists with transportation, sponge bathes at the sink, ind with ADLs and med mgmt        OT Problem List: Decreased strength;Decreased range of motion;Decreased activity tolerance;Impaired balance (sitting and/or standing)      OT Treatment/Interventions: Self-care/ADL training;Therapeutic exercise;Energy conservation;DME and/or AE instruction;Therapeutic activities;Balance training;Patient/family education    OT Goals(Current goals can be found in the care plan section) Acute Rehab OT Goals Patient Stated Goal: none stated OT Goal Formulation: With patient Time For Goal Achievement: 06/15/22 Potential to Achieve Goals: Good ADL Goals Pt Will Perform Upper Body Dressing: with modified independence;sitting Pt Will Perform Lower Body Dressing: with modified independence;sitting/lateral leans;sit to/from stand Pt Will Transfer to Toilet: with modified independence;ambulating;regular height toilet Pt Will Perform Tub/Shower Transfer: Tub transfer;Shower transfer;with modified independence;ambulating;3 in 1;rolling walker  OT Frequency: Min 1X/week    Co-evaluation              AM-PAC OT "6 Clicks" Daily Activity     Outcome Measure Help from another person eating meals?: None Help from another person taking care of personal grooming?: A Little Help from another person toileting, which includes using toliet, bedpan, or  urinal?: A Little Help from another person bathing (including washing, rinsing, drying)?: A Little Help from another person to put on and taking off regular upper body clothing?: A Little Help from another person to put on and taking off regular lower body clothing?: A Little 6 Click Score: 19   End of Session Equipment Utilized During Treatment: Gait belt;Rolling walker (2 wheels) Nurse Communication: Mobility status  Activity Tolerance: Patient tolerated treatment well Patient left: in chair;with call bell/phone within reach;with chair alarm set  OT Visit Diagnosis: Unsteadiness on feet (R26.81);Other abnormalities of gait and mobility (R26.89);Muscle weakness (generalized) (M62.81)                Time: 1324-4010 OT Time Calculation (min): 19 min Charges:  OT General Charges $OT Visit: 1 Visit OT Evaluation $OT Eval Moderate Complexity: 1 Mod  Blaire Hodsdon K, OTD, OTR/L [employer] [facility] Acute Rehab [phone]  Carver Fila Koonce 06/01/2022, 11:55 AM

## 2022-06-01 NOTE — Progress Notes (Signed)
Initial Nutrition Assessment  DOCUMENTATION CODES:   Not applicable  INTERVENTION:  - Liberalize to Regular diet, 2000 mL fluid.   - Add Glucerna Shake po TID, each supplement provides 220 kcal and 10 grams of protein  - Add Renal MVI.   NUTRITION DIAGNOSIS:   Inadequate oral intake related to poor appetite as evidenced by per patient/family report.  GOAL:   Patient will meet greater than or equal to 90% of their needs  MONITOR:   PO intake, Supplement acceptance  REASON FOR ASSESSMENT:   Malnutrition Screening Tool    ASSESSMENT:   87 y.o. female admits related to fatigue and poor oral intake. PMH includes: DM, osteoporosis. Pt is currently receiving medical management related to acute kidney injury.  Meds reviewed:  lipitor, sliding scale insulin, remeron, miralax. Labs reviewed: BUN/Creatinine elevated, phos low. FS BG 136-203 mg/dL.   Pt was out of room at time of assessment. No intakes documented at time of assessment. The pt is currently on a renal/CHO mod diet, 2000 mL fluid restriction. RD will liberalize diet to Regular, 2000 mL fluid restriction. Will also add Glucerna with meals as well. Will continue to closely monitor PO intakes.    NUTRITION - FOCUSED PHYSICAL EXAM:  Assess at f/u.   Diet Order:   Diet Order             Diet renal/carb modified with fluid restriction Diet-HS Snack? Nothing; Fluid restriction: 2000 mL Fluid; Room service appropriate? Yes; Fluid consistency: Thin  Diet effective now                   EDUCATION NEEDS:   Not appropriate for education at this time  Skin:  Skin Assessment: Reviewed RN Assessment  Last BM:  PTA  Height:   Ht Readings from Last 1 Encounters:  05/29/22 5\' 2"  (1.575 m)    Weight:   Wt Readings from Last 1 Encounters:  05/30/22 68.6 kg    Ideal Body Weight:     BMI:  Body mass index is 27.66 kg/m.  Estimated Nutritional Needs:   Kcal:  1478-2956 kcals  Protein:  85-100  gm  Fluid:  >/= 1.7 L  Bethann Humble, RD, LDN, CNSC.

## 2022-06-01 NOTE — Care Management Important Message (Signed)
Important Message  Patient Details  Name: MAKAIAH VANAUKEN MRN: [id number] Date of Birth: 21-Oct-1935   Medicare Important Message Given:  Yes     Marquisa Salih Stefan Church 06/01/2022, 3:17 PM

## 2022-06-01 NOTE — Progress Notes (Addendum)
Received patient at 1:24 assessments and medications incomplete. Patient already ate lunch, IV medications re timed.  Charge nurse notified.

## 2022-06-01 NOTE — Evaluation (Signed)
Physical Therapy Evaluation Patient Details Name: Casey [name] MRN: [id number] DOB: 07/29/1935 Today's Date: 06/01/2022  History of Present Illness  Pt is an 87 y/o F presenting to ED on 5/10 with generalized weakness and decreased appetite. Admitted for AKI. PMH includes HTN, DM, fatigue, obesity, osteoporosis  Clinical Impression  Pt presents to PT with deficits in strength, power, endurance, however she does not seem to be far from her recent baseline. Pt is able to ambulate and transfer without physical assistance. Pt reports a recent decline in mobility, beginning to utilize a RW ~2 months ago, with a more acute decline in days leading up to admission. Pt reports great improvement in strength since being admitted. PT recommends HHPT at the time of discharge to allow the pt to progress to community level ambulation without DME.       Recommendations for follow up therapy are one component of a multi-disciplinary discharge planning process, led by the attending physician.  Recommendations may be updated based on patient status, additional functional criteria and insurance authorization.  Follow Up Recommendations       Assistance Recommended at Discharge PRN  Patient can return home with the following  A little help with bathing/dressing/bathroom;Assistance with cooking/housework;Assist for transportation;Help with stairs or ramp for entrance    Equipment Recommendations None recommended by PT  Recommendations for Other Services       Functional Status Assessment Patient has had a recent decline in their functional status and demonstrates the ability to make significant improvements in function in a reasonable and predictable amount of time.     Precautions / Restrictions Precautions Precautions: Fall Restrictions Weight Bearing Restrictions: No      Mobility  Bed Mobility                    Transfers Overall transfer level: Needs assistance Equipment used:  None Transfers: Sit to/from Stand Sit to Stand: Supervision                Ambulation/Gait Ambulation/Gait assistance: Modified independent (Device/Increase time) Gait Distance (Feet): 250 Feet Assistive device: Rolling walker (2 wheels) Gait Pattern/deviations: Step-through pattern Gait velocity: functional Gait velocity interpretation: 1.31 - 2.62 ft/sec, indicative of limited community ambulator   General Gait Details: slowed step-through gait  Stairs            Wheelchair Mobility    Modified Rankin (Stroke Patients Only)       Balance Overall balance assessment: Needs assistance Sitting-balance support: Feet supported, No upper extremity supported Sitting balance-Leahy Scale: Good     Standing balance support: No upper extremity supported, During functional activity Standing balance-Leahy Scale: Fair                               Pertinent Vitals/Pain Pain Assessment Pain Assessment: No/denies pain    Home Living Family/patient expects to be discharged to:: Private residence Living Arrangements: Alone Available Help at Discharge: Friend(s);Family;Available PRN/intermittently Type of Home: House Home Access: Level entry       Home Layout: One level Home Equipment: Agricultural consultant (2 wheels);Cane - single point      Prior Function Prior Level of Function : Needs assist             Mobility Comments: pt began ambulating with a RW ~2 months ago, was independent without device prior. Enjoys walking in the neighborhood ADLs Comments: friend assists with transportation, sponge bathes at the  sink, ind with ADLs and med mgmt     Hand Dominance   Dominant Hand: Right    Extremity/Trunk Assessment   Upper Extremity Assessment Upper Extremity Assessment: Defer to OT evaluation    Lower Extremity Assessment Lower Extremity Assessment: Generalized weakness    Cervical / Trunk Assessment Cervical / Trunk Assessment: Normal   Communication   Communication: No difficulties  Cognition Arousal/Alertness: Awake/alert Behavior During Therapy: WFL for tasks assessed/performed Overall Cognitive Status: Within Functional Limits for tasks assessed                                          General Comments General comments (skin integrity, edema, etc.): VSS on RA    Exercises     Assessment/Plan    PT Assessment Patient needs continued PT services  PT Problem List Decreased strength;Decreased activity tolerance;Decreased balance;Decreased mobility       PT Treatment Interventions DME instruction;Gait training;Functional mobility training;Therapeutic activities;Therapeutic exercise;Neuromuscular re-education;Balance training;Patient/family education    PT Goals (Current goals can be found in the Care Plan section)  Acute Rehab PT Goals Patient Stated Goal: to return to ambulating without DME PT Goal Formulation: With patient Time For Goal Achievement: 06/15/22 Potential to Achieve Goals: Good Additional Goals Additional Goal #1: Pt will score >19/24 on the DGI to indicate a reduced risk for falls    Frequency Min 2X/week     Co-evaluation               AM-PAC PT "6 Clicks" Mobility  Outcome Measure Help needed turning from your back to your side while in a flat bed without using bedrails?: A Little Help needed moving from lying on your back to sitting on the side of a flat bed without using bedrails?: A Little Help needed moving to and from a bed to a chair (including a wheelchair)?: A Little Help needed standing up from a chair using your arms (e.g., wheelchair or bedside chair)?: A Little Help needed to walk in hospital room?: None Help needed climbing 3-5 steps with a railing? : A Little 6 Click Score: 19    End of Session   Activity Tolerance: Patient tolerated treatment well Patient left: in chair;with call bell/phone within reach;with chair alarm set Nurse  Communication: Mobility status PT Visit Diagnosis: Other abnormalities of gait and mobility (R26.89);Muscle weakness (generalized) (M62.81)    Time: 4782-9562 PT Time Calculation (min) (ACUTE ONLY): 15 min   Charges:   PT Evaluation $PT Eval Low Complexity: 1 Low          Arlyss Gandy, PT, DPT Acute Rehabilitation Office [phone]   Arlyss Gandy 06/01/2022, 1:22 PM

## 2022-06-01 NOTE — TOC Initial Note (Signed)
Transition of Care [facility]) - Initial/Assessment Note    Patient Details  Name: Casey [name] MRN: [id number] Date of Birth: 02-May-1935  [facility] CM/SW Contact:    Gordy Clement, RN Phone Number: 06/01/2022, 3:13 PM  Clinical Narrative:      CM met with patient bedside. Patient states she is from home alone. She states she has a friend named "Kandis Mannan" [phone]) that drives her to the grocery store and to appointments etc. She also stated she has a Niece that can be at her house in just a few minutes   Matier,Kenyettta (Niece)[phone]   HH PT and OT have been recommended CM trying to find accepting agency. Waiting to hear back from [facility] . A BSC and Tub Bench have been ordered by [employer] and will be delivered bedside prior to DC . Patient already owns a rolling walker.  Patient states her friend Kandis Mannan will transport               Expected Discharge Plan: Home w Home Health Services Barriers to Discharge: Continued Medical Work up   Patient Goals and CMS Choice Patient states their goals for this hospitalization and ongoing recovery are:: Go HOme CMS Medicare.gov Compare Post Acute Care list provided to:: Patient Choice offered to / list presented to : Patient      Expected Discharge Plan and Services   Discharge Planning Services: NA Post Acute Care Choice: Durable Medical Equipment, Home Health Living arrangements for the past 2 months: Single Family Home                 DME Arranged: Tub bench, Bedside commode DME Agency: [employer] Date DME Agency Contacted: 06/01/22 Time DME Agency Contacted: 1511 Representative spoke with at DME Agency: [name] HH Arranged: OT, PT HH Agency:  (TBD  Trying to find accepting agency  [facility])        Prior Living Arrangements/Services Living arrangements for the past 2 months: Single Family Home   Patient language and need for interpreter reviewed:: Yes Do you feel safe going back to the place where you  live?: Yes      Need for Family Participation in Patient Care: No (Comment) Care giver support system in place?: Yes (comment)   Criminal Activity/Legal Involvement Pertinent to Current Situation/Hospitalization: No - Comment as needed  Activities of Daily Living Home Assistive Devices/Equipment: Environmental consultant (specify type), Other (Comment) (Cane) ADL Screening (condition at time of admission) Patient's cognitive ability adequate to safely complete daily activities?: No Is the patient deaf or have difficulty hearing?: No Does the patient have difficulty seeing, even when wearing glasses/contacts?: No Does the patient have difficulty concentrating, remembering, or making decisions?: No Patient able to express need for assistance with ADLs?: Yes Does the patient have difficulty dressing or bathing?: No Independently performs ADLs?: Yes (appropriate for developmental age) Does the patient have difficulty walking or climbing stairs?: Yes Weakness of Legs: Both Weakness of Arms/Hands: None  Permission Sought/Granted Permission sought to share information with : Family Supports, Other (comment) (DME/HH) Permission granted to share information with : Yes, Verbal Permission Granted  Share Information with NAME: Family/Omar- driver  Permission granted to share info w AGENCY: DME/ HH        Emotional Assessment Appearance:: Appears stated age Attitude/Demeanor/Rapport: Gracious, Engaged Affect (typically observed): Pleasant Orientation: : Oriented to Self, Oriented to Place, Oriented to  Time, Oriented to Situation Alcohol / Substance Use: Not Applicable Psych Involvement: No (comment)  Admission  diagnosis:  Acute renal failure (ARF) (HCC) [N17.9] Patient Active Problem List   Diagnosis Date Noted   Chronic polyneuropathy 05/30/2022   Acute kidney injury (HCC) 05/29/2022   Right renal mass 05/29/2022   Debility 04/29/2022   Renal cyst, Bilateral 02/12/2022   Chronic pain 12/26/2021    Depression, major, single episode, moderate (HCC) 09/26/2021   Insomnia 09/26/2021   Dementia without behavioral disturbance (HCC) 01/03/2020   PUD (peptic ulcer disease) 07/17/2019   Vitamin B12 deficiency 07/14/2017   Osteoarthritis 07/07/2013   Type 2 diabetes mellitus with microalbuminuria, without long-term current use of insulin (HCC) 05/26/2012   Long Q-T syndrome 11/26/2010   CYSTOCELE WITH INCOMPLETE UTERINE PROLAPSE 03/10/2010   MICROSCOPIC HEMATURIA 07/29/2009   Sinusitis, chronic 02/23/2008   BENIGN POSITIONAL VERTIGO 04/25/2007   Dyslipidemia associated with type 2 diabetes mellitus (HCC) 03/03/2007   GAD (generalized anxiety disorder) 03/03/2007   Hypertension associated with diabetes (HCC) 03/03/2007   Allergic rhinitis 03/03/2007   Osteoporosis 03/03/2007   PCP:  Ardith Dark, MD Pharmacy:   [facility] [phone] - [place], [place] - [address] AT [address] [place] [address] Phone: [phone] Fax: [phone]  [id number] - [address] [place] - [address] AT [address] [place] [address] Phone: [phone] Fax: [phone]     Social Determinants of Health (SDOH) Social History: SDOH Screenings   Food Insecurity: No Food Insecurity (05/29/2022)  Housing: Low Risk  (05/29/2022)  Transportation Needs: No Transportation Needs (05/29/2022)  Utilities: Not At Risk (05/29/2022)  Alcohol Screen: Low Risk  (10/19/2019)  Depression (PHQ2-9): High Risk (05/12/2022)  Financial Resource Strain: Low Risk  (12/25/2016)  Physical Activity: Inactive (12/25/2016)  Social Connections: Moderately Isolated (12/25/2016)  Stress: No Stress Concern Present (12/25/2016)  Tobacco Use: Low Risk  (05/29/2022)   SDOH Interventions:     Readmission Risk Interventions     No data to display

## 2022-06-02 ENCOUNTER — Other Ambulatory Visit (HOSPITAL_COMMUNITY): Payer: Self-pay

## 2022-06-02 DIAGNOSIS — F411 Generalized anxiety disorder: Secondary | ICD-10-CM | POA: Diagnosis not present

## 2022-06-02 DIAGNOSIS — N179 Acute kidney failure, unspecified: Secondary | ICD-10-CM | POA: Diagnosis not present

## 2022-06-02 DIAGNOSIS — E860 Dehydration: Secondary | ICD-10-CM

## 2022-06-02 DIAGNOSIS — E1169 Type 2 diabetes mellitus with other specified complication: Secondary | ICD-10-CM | POA: Diagnosis not present

## 2022-06-02 LAB — COMPREHENSIVE METABOLIC PANEL
ALT: 13 U/L (ref 0–44)
AST: 13 U/L — ABNORMAL LOW (ref 15–41)
Albumin: 2.7 g/dL — ABNORMAL LOW (ref 3.5–5.0)
Alkaline Phosphatase: 48 U/L (ref 38–126)
Anion gap: 6 (ref 5–15)
BUN: 23 mg/dL (ref 8–23)
CO2: 20 mmol/L — ABNORMAL LOW (ref 22–32)
Calcium: 8.4 mg/dL — ABNORMAL LOW (ref 8.9–10.3)
Chloride: 115 mmol/L — ABNORMAL HIGH (ref 98–111)
Creatinine, Ser: 0.9 mg/dL (ref 0.44–1.00)
GFR, Estimated: >60 mL/min (ref >60)
Glucose, Bld: 121 mg/dL — ABNORMAL HIGH (ref 70–99)
Potassium: 3.9 mmol/L (ref 3.5–5.1)
Sodium: 141 mmol/L (ref 135–145)
Total Bilirubin: 0.4 mg/dL (ref 0.3–1.2)
Total Protein: 5.3 g/dL — ABNORMAL LOW (ref 6.5–8.1)

## 2022-06-02 LAB — MAGNESIUM: Magnesium: 1.6 mg/dL — ABNORMAL LOW (ref 1.7–2.4)

## 2022-06-02 LAB — CBC WITH DIFFERENTIAL/PLATELET
Abs Immature Granulocytes: 0.02 10*3/uL (ref 0.00–0.07)
Basophils Absolute: 0.1 10*3/uL (ref 0.0–0.1)
Basophils Relative: 1 %
Eosinophils Absolute: 0.2 10*3/uL (ref 0.0–0.5)
Eosinophils Relative: 3 %
HCT: 32.1 % — ABNORMAL LOW (ref 36.0–46.0)
Hemoglobin: 10.4 g/dL — ABNORMAL LOW (ref 12.0–15.0)
Immature Granulocytes: 0 %
Lymphocytes Relative: 59 %
Lymphs Abs: 4 10*3/uL (ref 0.7–4.0)
MCH: 27.8 pg (ref 26.0–34.0)
MCHC: 32.4 g/dL (ref 30.0–36.0)
MCV: 85.8 fL (ref 80.0–100.0)
Monocytes Absolute: 0.5 10*3/uL (ref 0.1–1.0)
Monocytes Relative: 8 %
Neutro Abs: 2 10*3/uL (ref 1.7–7.7)
Neutrophils Relative %: 29 %
Platelets: 267 10*3/uL (ref 150–400)
RBC: 3.74 MIL/uL — ABNORMAL LOW (ref 3.87–5.11)
RDW: 15.9 % — ABNORMAL HIGH (ref 11.5–15.5)
WBC: 6.8 10*3/uL (ref 4.0–10.5)
nRBC: 0 % (ref 0.0–0.2)

## 2022-06-02 LAB — PHOSPHORUS: Phosphorus: 1.7 mg/dL — ABNORMAL LOW (ref 2.5–4.6)

## 2022-06-02 LAB — GLUCOSE, CAPILLARY
Glucose-Capillary: 121 mg/dL — ABNORMAL HIGH (ref 70–99)
Glucose-Capillary: 157 mg/dL — ABNORMAL HIGH (ref 70–99)
Glucose-Capillary: 127 mg/dL — ABNORMAL HIGH (ref 70–99)

## 2022-06-02 MED ORDER — EMPAGLIFLOZIN 10 MG PO TABS
10.0000 mg | ORAL_TABLET | Freq: Every day | ORAL | Status: DC
  Administered 2022-06-02: 10 mg via ORAL
  Filled 2022-06-02: qty 1

## 2022-06-02 MED ORDER — MAGNESIUM SULFATE 50 % IJ SOLN
3.0000 g | Freq: Once | INTRAVENOUS | Status: AC
  Administered 2022-06-02: 3 g via INTRAVENOUS
  Filled 2022-06-02: qty 6

## 2022-06-02 MED ORDER — SENNOSIDES-DOCUSATE SODIUM 8.6-50 MG PO TABS
1.0000 | ORAL_TABLET | Freq: Every evening | ORAL | 0 refills | Status: DC | PRN
  Filled 2022-06-02: qty 30, 30d supply, fill #0

## 2022-06-02 MED ORDER — POTASSIUM PHOSPHATES 15 MMOLE/5ML IV SOLN
30.0000 mmol | Freq: Once | INTRAVENOUS | Status: AC
  Administered 2022-06-02: 30 mmol via INTRAVENOUS
  Filled 2022-06-02: qty 10

## 2022-06-02 MED ORDER — POLYETHYLENE GLYCOL 3350 17 GM/SCOOP PO POWD
17.0000 g | Freq: Every day | ORAL | 0 refills | Status: DC
  Filled 2022-06-02: qty 238, 14d supply, fill #0

## 2022-06-02 MED ORDER — GABAPENTIN 100 MG PO CAPS
100.0000 mg | ORAL_CAPSULE | Freq: Three times a day (TID) | ORAL | 0 refills | Status: DC
  Filled 2022-06-02: qty 90, 30d supply, fill #0

## 2022-06-02 MED ORDER — CERTAVITE/ANTIOXIDANTS PO TABS
1.0000 | ORAL_TABLET | Freq: Every day | ORAL | 0 refills | Status: DC
  Filled 2022-06-02: qty 130, 130d supply, fill #0

## 2022-06-02 NOTE — Discharge Summary (Signed)
Physician Discharge Summary  [name]:[id number] DOB: [birth year] DOA: 05/29/2022  PCP: Ardith Dark, MD  Admit date: 05/29/2022 Discharge date: 06/02/2022  Time spent: 35 minutes  Recommendations for Outpatient Follow-up:   Acute kidney injury: BUN 87 with creatinine 7.04 on admission compared to recent baseline 1.0-1.3.  UA is negative for UTI.  CT renal stone study negative for hydronephrosis or obstruction.  Suspect prerenal due to poor oral intake and medication effect as etiology.  Mentating well on admission. Lab Results  Component Value Date   CREATININE 0.90 06/02/2022   CREATININE 1.08 (H) 06/01/2022   CREATININE 1.78 (H) 05/31/2022   CREATININE 4.20 (H) 05/30/2022   CREATININE 7.04 (H) 05/29/2022  -Normal saline 133ml/hr -Strict in and out +6.6 L - Daily weight Filed Weights   05/29/22 1610 05/30/22 0600 06/02/22 0450  Weight: 69.9 kg 68.6 kg 68.2 kg  -Hold lisinopril -5/13 urine culture bacteria, positive yeast.  Would not treat as AKI has resolved  Right renal mass: -Patient with known right renal cystic lesion measuring up to 3.6 cm.   -Noncontrast CT shows short-term stability compared to recent ultrasound.   -Outpatient MRI abdomen w/wo contrast per PCP when renal function allows.   Hematuria -5/10 moderate blood in urine. -5/14 resolved     Type 2 diabetes: Uncontrolled with hyperglycemia -Holding Jardiance.  Placed on SSI. -5/12 hemoglobin A1c = 8.0 CBG (last 3)  Recent Labs    06/01/22 1724 06/01/22 1959 06/02/22 0805  GLUCAP 143* 203* 121*  -Restart Jardiance milligrams daily -Follow-up with PCP in 1 to 2 weeks uncontrolled diabetes  LEFT CVA tenderness/left hip pain? - 5/13 urine culture pending - 5/13 DG LEFT hip moderate osteoarthritis see below. -Follow-up with PCP in 1 to 2 weeks, pain control, referral to orthopedic surgery for PT   Hypertension: -Stable without BP medication.   Hyperlipidemia: - 5/12 lipid panel= 111, not  at goal of<70 -5/12 increase Lipitor 40 mg daily   Peptic ulcer disease: -Protonix.  40 mg daily   Constipation - 5/11 MiraLAX 17 g daily   Depression/anxiety: - BuSpar 5 mg BID -Remeron 7.5 mg daily -Seroquel 50 mg daily   Chronic polyneuropathy -5/11 Gabapentin 100 mg tid   Mild cognitive impairment: -Stable, mentating well on admission   Hypophosphatemia - Phosphorus goal> 2.5 - 5/13 K-Phos.  15 mmol  Hypomagnesmia - Magnesium goal> 2 - Magnesium IV 3 g prior to discharge  Hypophosphatemia - Phosphorus goal> 2.5 -K-Phos 30 mmol prior to discharge    Discharge Diagnoses:  Principal Problem:   Acute kidney injury (HCC) Active Problems:   Dyslipidemia associated with type 2 diabetes mellitus (HCC)   GAD (generalized anxiety disorder)   Hypertension associated with diabetes (HCC)   Type 2 diabetes mellitus with microalbuminuria, without long-term current use of insulin (HCC)   PUD (peptic ulcer disease)   Right renal mass   Chronic polyneuropathy   Discharge Condition: Stable  Diet recommendation: Heart healthy  Filed Weights   05/29/22 1610 05/30/22 0600 06/02/22 0450  Weight: 69.9 kg 68.6 kg 68.2 kg    History of present illness:  Casey [name] is a 87 y.o. BF PMHx DM type II, HTN, HLD, PUD, anxiety, mild cognitive impairment    Presented to the ED for evaluation of generalized weakness and low appetite with poor oral intake.   Patient states that she has had no appetite and no oral intake the last 3 days.  She has been having abdominal pain mostly  in her left abdomen radiating to her epigastric region.  She has not had any nausea, vomiting, dyspnea.  She has been feeling constipated and reports last bowel movement 2 days ago.  She says she has been having good urine output without dysuria.  She reports having swelling to her legs "off-and-on" but not today.  She lives alone.  Hospital Course:  See above  Procedures/Significant Events: 5/10 DG  abdomen acute series: Negative abdominal findings 5/10 CT renal stone study No CT evidence for acute intra-abdominal or pelvic abnormality. Negative for hydronephrosis. 2. Peripherally calcified thick walled cystic lesion at the midpole of the right kidney measuring up to 3.6 cm, not adequately characterized without contrast. This has shown short term stability but MRI previously recommended based on renal ultrasound 05/01/2022. When the patient is clinically stable and able to follow directions and hold their breath (preferably as an outpatient) further evaluation with dedicated abdominal MRI should be considered. 3. Small nonobstructing left kidney stone 5/13 DG hip unilateral W or W0 pelvis Moderate right and mild-to-moderate left femoroacetabular osteoarthritis    Consultants:        Cultures 5/10 urine positive moderate blood 5/13 urine culture bacteria, positive yeast.  Hematuria resolved  Antibiotics Anti-infectives (From admission, onward)    None         Discharge Exam: Vitals:   06/01/22 1721 06/01/22 1955 06/02/22 0450 06/02/22 0742  BP: 125/77 114/61 117/79 (!) 114/57  Pulse: 97 76 73 62  Resp: 18 16 16    Temp: 98.5 F (36.9 C) 99.3 F (37.4 C) 98.4 F (36.9 C)   TempSrc:  Oral Oral   SpO2: 100% 100% 100% 99%  Weight:   68.2 kg   Height:       General: A/O x 4, No acute respiratory distress Eyes: negative scleral hemorrhage, negative anisocoria, negative icterus ENT: Negative Runny nose, negative gingival bleeding, Neck:  Negative scars, masses, torticollis, lymphadenopathy, JVD Lungs: Clear to auscultation bilaterally without wheezes or crackles Cardiovascular: Regular rate and rhythm without murmur gallop or rub normal S1 and S2  Discharge Instructions   Allergies as of 06/02/2022   No Known Allergies      Medication List     STOP taking these medications    lisinopril 40 MG tablet Commonly known as: ZESTRIL       TAKE these  medications    aspirin 81 MG chewable tablet Chew 1 tablet (81 mg total) by mouth 2 (two) times daily.   atorvastatin 10 MG tablet Commonly known as: LIPITOR Take 1 tablet (10 mg total) by mouth daily.   azelastine 0.05 % ophthalmic solution Commonly known as: OPTIVAR Place 1 drop into both eyes 2 (two) times daily.   azelastine 0.1 % nasal spray Commonly known as: ASTELIN INSTILL 1 SPRAY INTO BOTH NOSTRILS TWICE DAILY What changed:  how much to take how to take this when to take this additional instructions   busPIRone 5 MG tablet Commonly known as: BUSPAR Take 1 tablet (5 mg total) by mouth 2 (two) times daily.   empagliflozin 10 MG Tabs tablet Commonly known as: Jardiance TAKE 1 TABLET(10 MG) BY MOUTH DAILY BEFORE BREAKFAST What changed:  how much to take how to take this when to take this additional instructions   gabapentin 100 MG capsule Commonly known as: NEURONTIN Take 1 capsule (100 mg total) by mouth 3 (three) times daily.   HYDROcodone-acetaminophen 5-325 MG tablet Commonly known as: NORCO/VICODIN Take 1 tablet by mouth every 8 (eight)  hours as needed for moderate pain. For chronic pain   hydrOXYzine 10 MG tablet Commonly known as: ATARAX Take 1-2 tablets (10-20 mg total) by mouth 3 (three) times daily as needed. What changed: reasons to take this   meclizine 25 MG tablet Commonly known as: ANTIVERT Take 1 tablet (25 mg total) by mouth 3 (three) times daily as needed for dizziness.   mirtazapine 7.5 MG tablet Commonly known as: REMERON Take 1 tablet (7.5 mg total) by mouth at bedtime.   multivitamin Tabs tablet Take 1 tablet by mouth at bedtime.   ondansetron 8 MG disintegrating tablet Commonly known as: ZOFRAN-ODT Take 1 tablet (8 mg total) by mouth every 8 (eight) hours as needed for nausea or vomiting.   pantoprazole 40 MG tablet Commonly known as: PROTONIX Take 1 tablet (40 mg total) by mouth 2 (two) times daily for 28 days, THEN 1  tablet (40 mg total) daily. Start taking on: February 07, 2022 What changed: See the new instructions.   polyethylene glycol powder 17 GM/SCOOP powder Commonly known as: GLYCOLAX/MIRALAX Take 17 g by mouth daily. Start taking on: Jun 03, 2022   potassium chloride SA 20 MEQ tablet Commonly known as: KLOR-CON M Take one tablet twice a day for the next two weeks, then take one tablet every day What changed:  how much to take how to take this when to take this additional instructions   QUEtiapine 50 MG Tb24 24 hr tablet Commonly known as: SEROQUEL XR Take 2 tablets (100 mg total) by mouth at bedtime. What changed: how much to take   Refresh Optive 1-0.9 % Gel Generic drug: Carboxymethylcellul-Glycerin Place 1 drop into both eyes daily.   rivastigmine 4.6 mg/24hr Commonly known as: Exelon Place 1 patch (4.6 mg total) onto the skin daily.   senna-docusate 8.6-50 MG tablet Commonly known as: Senokot-S Take 1 tablet by mouth at bedtime as needed for mild constipation.   Slow-Mag 64 MG Tbec SR tablet Generic drug: magnesium chloride Take 1 tablet (64 mg total) by mouth 2 (two) times daily.   sucralfate 1 GM/10ML suspension Commonly known as: Carafate Take 10 mLs (1 g total) by mouth 4 (four) times daily -  with meals and at bedtime.       No Known Allergies    The results of significant diagnostics from this hospitalization (including imaging, microbiology, ancillary and laboratory) are listed below for reference.    Significant Diagnostic Studies: DG HIP UNILAT WITH PELVIS 2-3 VIEWS LEFT  Result Date: 06/01/2022 CLINICAL DATA:  Anterior left hip pain for 3 months. Worsening over time. EXAM: DG HIP (WITH OR WITHOUT PELVIS) 2-3V LEFT COMPARISON:  CT abdomen and pelvis 05/29/2022 FINDINGS: Moderate right and mild-to-moderate left superomedial femoroacetabular joint space narrowing. Mild superior pubic symphysis joint space narrowing and superior osteophytosis. Mild right  and minimal left femoral head-neck junction circumferential degenerative osteophytes. No acute fracture or dislocation. IMPRESSION: Moderate right and mild-to-moderate left femoroacetabular osteoarthritis. Electronically Signed   By: Neita Garnet M.D.   On: 06/01/2022 17:05   CT Renal Stone Study  Result Date: 05/29/2022 CLINICAL DATA:  Constipation renal function EXAM: CT ABDOMEN AND PELVIS WITHOUT CONTRAST TECHNIQUE: Multidetector CT imaging of the abdomen and pelvis was performed following the standard protocol without IV contrast. RADIATION DOSE REDUCTION: This exam was performed according to the departmental dose-optimization program which includes automated exposure control, adjustment of the mA and/or kV according to patient size and/or use of iterative reconstruction technique. COMPARISON:  Radiograph 05/29/2022,  CT 04/23/2022 FINDINGS: Lower chest: Lung bases demonstrate no acute airspace disease. Hepatobiliary: Hypodensity within the right hepatic dome, probably a cyst. No calcified gallstones or biliary dilatation Pancreas: Unremarkable. No pancreatic ductal dilatation or surrounding inflammatory changes. Spleen: Normal in size without focal abnormality. Adrenals/Urinary Tract: Adrenal glands are normal. Bilateral renal cysts for which no imaging follow-up is recommended. 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 Peripherally calcified thick walled cystic lesion at the midpole of the right kidney measuring 3.6 by 2.8 cm not adequately characterized without contrast. The bladder is unremarkable. Punctate left kidney stone. Stomach/Bowel: The stomach is nonenlarged. No dilated small bowel. No acute bowel wall thickening. Vascular/Lymphatic: Mild atherosclerosis. No aneurysm. No suspicious lymph nodes. Reproductive: Uterus unremarkable.  No adnexal mass Other: Negative for pelvic effusion or free air. Small fat containing umbilical and periumbilical ventral hernias. Musculoskeletal: No  acute osseous abnormality. IMPRESSION: 1. No CT evidence for acute intra-abdominal or pelvic abnormality. Negative for hydronephrosis. 2. Peripherally calcified thick walled cystic lesion at the midpole of the right kidney measuring up to 3.6 cm, not adequately characterized without contrast. This has shown short term stability but MRI previously recommended based on renal ultrasound 05/01/2022. When the patient is clinically stable and able to follow directions and hold their breath (preferably as an outpatient) further evaluation with dedicated abdominal MRI should be considered. 3. Small nonobstructing left kidney stone Electronically Signed   By: Jasmine Pang M.D.   On: 05/29/2022 20:46   DG Abd Acute W/Chest  Result Date: 05/29/2022 CLINICAL DATA:  Constipation and chest pain EXAM: DG ABDOMEN ACUTE WITH 1 VIEW CHEST COMPARISON:  Chest radiograph dated 05/04/2022, CT abdomen and pelvis dated 04/23/2022 FINDINGS: Lines/tubes: None. Chest: Lungs are clear without focal consolidation. No pneumothorax or pleural effusion. Normal heart size. Abdomen: Nonobstructive bowel gas pattern. No pneumatosis or free air. No abnormal calcification or mass effect. Bones: No acute osseous abnormality. IMPRESSION: Negative abdominal radiographs.  No acute cardiopulmonary disease. Electronically Signed   By: Agustin Cree M.D.   On: 05/29/2022 16:51   DG Chest 2 View  Result Date: 05/04/2022 CLINICAL DATA:  Dizziness and headaches that started 2 hours ago. Racing heart. EXAM: CHEST - 2 VIEW COMPARISON:  Radiographs 04/23/2022 FINDINGS: Stable cardiomediastinal silhouette. No focal consolidation, pleural effusion, or pneumothorax. No displaced rib fractures. IMPRESSION: No active cardiopulmonary disease. Electronically Signed   By: Minerva Fester M.D.   On: 05/04/2022 01:35    Microbiology: No results found for this or any previous visit (from the past 240 hour(s)).   Labs: Basic Metabolic Panel: Recent Labs  Lab  05/29/22 1635 05/30/22 0423 05/31/22 0435 06/01/22 0426 06/02/22 0549  NA 135 136 141 141 141  K 4.5 4.2 3.9 3.9 3.9  CL 98 104 111 114* 115*  CO2 15* 19* 21* 21* 20*  GLUCOSE 132* 118* 119* 132* 121*  BUN 87* 75* 56* 34* 23  CREATININE 7.04* 4.20* 1.78* 1.08* 0.90  CALCIUM 9.8 9.0 8.8* 8.7* 8.4*  MG 2.4  --  2.1 1.8 1.6*  PHOS  --   --  2.4* 1.2* 1.7*   Liver Function Tests: Recent Labs  Lab 05/29/22 1635 05/31/22 0435 06/01/22 0426 06/02/22 0549  AST 19 17 14* 13*  ALT 17 14 13 13   ALKPHOS 86 62 55 48  BILITOT 0.6 0.4 0.4 0.4  PROT 7.7 5.7* 5.4* 5.3*  ALBUMIN 4.2 3.0* 2.7* 2.7*   Recent Labs  Lab 05/29/22 1635  LIPASE 38   No results  for input(s): "AMMONIA" in the last 168 hours. CBC: Recent Labs  Lab 05/29/22 1635 05/30/22 0423 05/31/22 0435 06/01/22 0426 06/02/22 0549  WBC 8.8 7.0 6.9 7.2 6.8  NEUTROABS 5.1  --  2.5 2.5 2.0  HGB 12.8 10.9* 10.8* 10.1* 10.4*  HCT 40.4 33.9* 33.3* 30.9* 32.1*  MCV 86.1 86.3 86.3 84.9 85.8  PLT 333 287 262 259 267   Cardiac Enzymes: Recent Labs  Lab 05/29/22 2241  CKTOTAL 387*   BNP: BNP (last 3 results) No results for input(s): "BNP" in the last 8760 hours.  ProBNP (last 3 results) No results for input(s): "PROBNP" in the last 8760 hours.  CBG: Recent Labs  Lab 06/01/22 0845 06/01/22 1233 06/01/22 1724 06/01/22 1959 06/02/22 0805  GLUCAP 131* 136* 143* 203* 121*       Signed:  Carolyne Littles, MD [facility]

## 2022-06-02 NOTE — TOC Transition Note (Signed)
Transition of Care [facility]) - CM/SW Discharge Note   Patient Details  Name: Casey [name] MRN: [id number] Date of Birth: 1935-05-06  Transition of Care [facility]) CM/SW Contact:  Harriet Masson, RN Phone Number: 06/02/2022, 12:17 PM   Clinical Narrative:     Patient stable for discharge.  [facility] unable to accept due to not having therapies available.  Patient defers to this RNCM to find high rated agency.  Kandee Keen with [facility] accepted referral.  Patient has transportation.  Address, Phone number and PCP verified.  Final next level of care: Home w Home Health Services Barriers to Discharge: Barriers Resolved   Patient Goals and CMS Choice CMS Medicare.gov Compare Post Acute Care list provided to:: Patient Choice offered to / list presented to : Patient  Discharge Placement                 home        Discharge Plan and Services Additional resources added to the After Visit Summary for     Discharge Planning Services: NA Post Acute Care Choice: Durable Medical Equipment, Home Health          DME Arranged: Tub bench, Bedside commode DME Agency: [employer] Date DME Agency Contacted: 06/01/22 Time DME Agency Contacted: [id number] Representative spoke with at DME Agency: [name] HH Arranged: PT, OT HH Agency: [facility] Date [address] Agency Contacted: 06/02/22 Time HH Agency Contacted: 1217 Representative spoke with at [facility] Agency: Kandee Keen  Social Determinants of Health (SDOH) Interventions SDOH Screenings   Food Insecurity: No Food Insecurity (05/29/2022)  Housing: Low Risk  (05/29/2022)  Transportation Needs: No Transportation Needs (05/29/2022)  Utilities: Not At Risk (05/29/2022)  Alcohol Screen: Low Risk  (10/19/2019)  Depression (PHQ2-9): High Risk (05/12/2022)  Financial Resource Strain: Low Risk  (12/25/2016)  Physical Activity: Inactive (12/25/2016)  Social Connections: Moderately Isolated (12/25/2016)  Stress: No Stress Concern Present (12/25/2016)   Tobacco Use: Low Risk  (05/29/2022)     Readmission Risk Interventions     No data to display

## 2022-06-02 NOTE — Progress Notes (Signed)
Mobility Specialist Progress Note   06/02/22 1039  Mobility  Activity Ambulated with assistance in hallway  Level of Assistance Contact guard assist, steadying assist  Assistive Device Front wheel walker  Distance Ambulated (ft) 470 ft  Activity Response Tolerated well  Mobility Referral Yes  $Mobility charge 1 Mobility  Mobility Specialist Start Time (ACUTE ONLY) 1015  Mobility Specialist Stop Time (ACUTE ONLY) 1039  Mobility Specialist Time Calculation (min) (ACUTE ONLY) 24 min   Received pt in bed c/o L hip pain but agreeable to mobility. Pt was asymptomatic throughout ambulation and returned to room w/o fault. Left in chair w/ call bell in reach and chair alarm on.  Frederico Hamman Mobility Specialist Please contact via SecureChat or  Rehab office at [phone]

## 2022-06-03 ENCOUNTER — Telehealth: Payer: Self-pay

## 2022-06-03 NOTE — Transitions of Care (Post Inpatient/ED Visit) (Deleted)
   06/03/2022  Name: Casey [name] MRN: [id number] DOB: [birth year]  Today's TOC FU Call Status:    Attempted to reach the patient regarding the most recent Inpatient/ED visit.  Follow Up Plan: Additional outreach attempts will be made to reach the patient to complete the Transitions of Care (Post Inpatient/ED visit) call.   Signature ***

## 2022-06-04 NOTE — Telephone Encounter (Signed)
Patient returned call. Requests to be called. 

## 2022-06-05 ENCOUNTER — Telehealth: Payer: Self-pay | Admitting: Family Medicine

## 2022-06-05 NOTE — Transitions of Care (Post Inpatient/ED Visit) (Signed)
06/05/2022  Name: Casey [name] MRN: [id number] DOB: 09-30-1935  Today's TOC FU Call Status: Today's TOC FU Call Status:: Successful TOC FU Call Competed TOC FU Call Complete Date: 06/05/22  Transition Care Management Follow-up Telephone Call Type of Discharge: Inpatient Admission Reason for ED Visit: Other: How have you been since you were released from the hospital?: Better Any questions or concerns?: No Patient Questions/Concerns:: feeling weak Patient Questions/Concerns Addressed: Provided Patient Educational Materials  Items Reviewed: Did you receive and understand the discharge instructions provided?: Yes Medications obtained,verified, and reconciled?: Yes (Medications Reviewed) Any new allergies since your discharge?: No Dietary orders reviewed?: No Do you have support at home?: Yes People in Home: child(ren), adult  Medications Reviewed Today: Medications Reviewed Today     Reviewed by Thomasene Mohair, CPhT (Pharmacy Technician) on 05/29/22 at 2039  Med List Status: Complete   Medication Order Taking? Sig Documenting Provider Last Dose Status Informant  aspirin 81 MG chewable tablet [id number] No Chew 1 tablet (81 mg total) by mouth 2 (two) times daily.  Patient not taking: Reported on 05/29/2022   [place], Eric J, DO Not Taking Active Self  atorvastatin (LIPITOR) 10 MG tablet [id number] Yes Take 1 tablet (10 mg total) by mouth daily. [name], NP Past Week Active Self  azelastine (ASTELIN) 0.1 % nasal spray [id number] Yes INSTILL 1 SPRAY INTO BOTH NOSTRILS TWICE DAILY  Patient taking differently: Place 1 spray into both nostrils 2 (two) times daily.   [name], NP 05/28/2022 Active Self  azelastine (OPTIVAR) 0.05 % ophthalmic solution [id number] Yes Place 1 drop into both eyes 2 (two) times daily. [provider] 05/28/2022 Active Self  busPIRone (BUSPAR) 5 MG tablet [id number] Yes Take 1 tablet (5 mg total) by mouth 2 (two) times daily. Ardith Dark, MD 05/28/2022 Active Self  Carboxymethylcellul-Glycerin (REFRESH OPTIVE) 1-0.9 % GEL [id number] Yes Place 1 drop into both eyes daily. [provider] 05/28/2022 Active Self  empagliflozin (JARDIANCE) 10 MG TABS tablet [id number] Yes TAKE 1 TABLET(10 MG) BY MOUTH DAILY BEFORE BREAKFAST Ardith Dark, MD 05/28/2022 Active Self  HYDROcodone-acetaminophen (NORCO/VICODIN) 5-325 MG tablet [id number]  Take 1 tablet by mouth every 8 (eight) hours as needed for moderate pain. For chronic pain Ardith Dark, MD  Active Self  hydrOXYzine (ATARAX) 10 MG tablet [id number] Yes Take 1-2 tablets (10-20 mg total) by mouth 3 (three) times daily as needed.  Patient taking differently: Take 10-20 mg by mouth 3 (three) times daily as needed for anxiety.   Ardith Dark, MD 05/28/2022 Active Self  lisinopril (ZESTRIL) 40 MG tablet [id number] Yes Take 40 mg by mouth daily. [provider] 05/28/2022 Active Self  magnesium chloride (SLOW-MAG) 64 MG TBEC SR tablet [id number] No Take 1 tablet (64 mg total) by mouth 2 (two) times daily.  Patient not taking: Reported on 05/29/2022   Dione Booze, MD Not Taking Active Self  meclizine (ANTIVERT) 25 MG tablet [id number] Yes Take 1 tablet (25 mg total) by mouth 3 (three) times daily as needed for dizziness. Ardith Dark, MD Past Month Active Self  mirtazapine (REMERON) 7.5 MG tablet [id number] Yes Take 1 tablet (7.5 mg total) by mouth at bedtime. Dohmeier, Porfirio Mylar, MD 05/28/2022 Active Self  ondansetron (ZOFRAN-ODT) 8 MG disintegrating tablet [id number] Yes Take 1 tablet (8 mg total) by mouth every 8 (eight) hours as needed for nausea or vomiting. Dione Booze, MD Past Week Active Self  pantoprazole (PROTONIX) 40 MG tablet [id number] Yes Take 1  tablet (40 mg total) by mouth 2 (two) times daily for 28 days, THEN 1 tablet (40 mg total) daily.  Patient taking differently: Take 1 tablet (40mg ) by mouth daily   [place], Eric J, DO 05/28/2022 Active Self  potassium chloride  SA (KLOR-CON M) 20 MEQ tablet [id number] Yes Take one tablet twice a day for the next two weeks, then take one tablet every day Dione Booze, MD 05/28/2022 Active Self  QUEtiapine (SEROQUEL XR) 50 MG TB24 24 hr tablet [id number] Yes Take 2 tablets (100 mg total) by mouth at bedtime.  Patient taking differently: Take 50 mg by mouth at bedtime.   Ardith Dark, MD 05/28/2022 Active Self  rivastigmine (EXELON) 4.6 mg/24hr [id number] Yes Place 1 patch (4.6 mg total) onto the skin daily. Dohmeier, Porfirio Mylar, MD Past Month Active Self           Med Note (WH[facility], [facility] [place] May 04, 2022  5:10 AM) Pharmacy stated that the patch was available, so the prescription was never filled.  sucralfate (CARAFATE) 1 GM/10ML suspension [id number] Yes Take 10 mLs (1 g total) by mouth 4 (four) times daily -  with meals and at bedtime. Arthor Captain, PA-C 05/28/2022 Active Self            Home Care and Equipment/Supplies: Were Home Health Services Ordered?: Yes Name of Home Health Agency:: [facility] Has Agency set up a time to come to your home?: No Any new equipment or medical supplies ordered?: NA  Functional Questionnaire: Do you need assistance with bathing/showering or dressing?: No Do you need assistance with meal preparation?: No Do you need assistance with eating?: No Do you have difficulty maintaining continence: No Do you need assistance with getting out of bed/getting out of a chair/moving?: No Do you have difficulty managing or taking your medications?: No  Follow up appointments reviewed: PCP Follow-up appointment confirmed?: NA Date of PCP follow-up appointment?: 06/11/22 Follow-up Provider: [facility] Follow-up appointment confirmed?: NA Do you need transportation to your follow-up appointment?: No Do you understand care options if your condition(s) worsen?: Yes-patient verbalized understanding    SIGNATURE Madalene Mickler,CMA [facility] , AWV -Program

## 2022-06-05 NOTE — Telephone Encounter (Signed)
OK to give VO? 

## 2022-06-05 NOTE — Telephone Encounter (Signed)
Home Health Verbal Orders  Agency:  [facility]  Caller: Beckey Downing and title  Requesting PT: 1 week 1, 2 week 1 and 1 week 2    Reason for Request:  Pt was in hospital nurse did an evaluation, number of concerns, significant pain on left hip - pain level 7 to 9, number of missing meds at home, taking some meds not on list, she got a call from a pharmacy that she does not use that has a refill on a med she does not take, recommendation, pending social worker evaluation, she would benefit from a [facility] nurse for medication management, she is a diabetic and does not have anything to check her blood sugar  HH needs F2F w/in last 30 days     [phone]

## 2022-06-08 ENCOUNTER — Ambulatory Visit (HOSPITAL_COMMUNITY)
Admission: EM | Admit: 2022-06-08 | Discharge: 2022-06-08 | Disposition: A | Discharge status: Home / Self Care | Payer: 59 | Attending: Family Medicine | Admitting: Family Medicine

## 2022-06-08 ENCOUNTER — Encounter (HOSPITAL_COMMUNITY): Payer: Self-pay

## 2022-06-08 DIAGNOSIS — J0110 Acute frontal sinusitis, unspecified: Secondary | ICD-10-CM | POA: Diagnosis not present

## 2022-06-08 MED ORDER — AMOXICILLIN 875 MG PO TABS: 875.0000 mg | ORAL_TABLET | Freq: Two times a day (BID) | ORAL | 0 refills | Status: AC

## 2022-06-08 NOTE — Telephone Encounter (Signed)
Ok with me. Please place any necessary orders. 

## 2022-06-08 NOTE — ED Triage Notes (Signed)
Pt c/o sinus pressure to her face and head for over a week. States causing her to feel dizzy and kept her up all night. States took tylenol with little relief.

## 2022-06-08 NOTE — ED Provider Notes (Signed)
[facility]   [id number] 06/08/22 Arrival Time: 1019  ASSESSMENT & PLAN:  1. Acute non-recurrent frontal sinusitis    Begin: Meds ordered this encounter  Medications   amoxicillin (AMOXIL) 875 MG tablet    Sig: Take 1 tablet (875 mg total) by mouth 2 (two) times daily for 10 days.    Dispense:  20 tablet    Refill:  0   Discussed typical duration of symptoms. OTC symptom care as needed. Ensure adequate fluid intake and rest.   Follow-up Information     Ardith Dark, MD.   Specialty: Family Medicine Why: If worsening or failing to improve as anticipated. Contact information: [address] [place] [address] [phone]                 Reviewed expectations re: course of current medical issues. Questions answered. Outlined signs and symptoms indicating need for more acute intervention. Patient verbalized understanding. After Visit Summary given.   SUBJECTIVE: History from: patient.  Casey [name] is a 87 y.o. female who presents with complaint of nasal congestion, post-nasal drainage, and frontal sinus pain. Onset gradual,  1-2 weeks ago . Respiratory symptoms: none. Fever: denies. Occasional lightheaded feeling with current symptoms; transient; none currently. Overall normal PO intake without n/v. OTC treatment: Tylenol without much help.  Social History   Tobacco Use  Smoking Status Never  Smokeless Tobacco Never   OBJECTIVE:  Vitals:   06/08/22 1207  BP: (!) 146/77  Pulse: 76  Resp: 18  Temp: 98.5 F (36.9 C)  TempSrc: Oral  SpO2: 96%     General appearance: alert; no distress HEENT: nasal congestion; clear runny nose; throat irritation secondary to post-nasal drainage; frontal tenderness to palpation; turbinates boggy Neck: supple without LAD; trachea midline Lungs: unlabored respirations, symmetrical air entry; CTAB; no respiratory distress Skin: warm and dry Neuro: normal gait with cane Psychological: alert and  cooperative; normal mood and affect  No Known Allergies  Past Medical History:  Diagnosis Date   ALLERGIC RHINITIS    Allergy    ANXIETY    ARM PAIN, RIGHT    BENIGN POSITIONAL VERTIGO    Carcinoma in situ of colon    CONSTIPATION    CYSTOCELE WITH INCOMPLETE UTERINE PROLAPSE    Diabetes mellitus without complication (HCC)    ESOPHAGEAL STRICTURE    FATIGUE    FLANK PAIN, LEFT    Headache(784.0)    HIP PAIN, LEFT    HYPERCHOLESTEROLEMIA    HYPERGLYCEMIA    HYPERTENSION, BENIGN ESSENTIAL 03/03/2007   LEG EDEMA, BILATERAL    Microscopic hematuria    OBESITY    OSTEOARTHRITIS, KNEE    OSTEOPOROSIS    Peripheral neuropathy, idiopathic    Problems with swallowing and mastication    SINUSITIS, RECURRENT    SPRAIN&STRAIN OTH SPEC SITES SHOULDER&UPPER ARM    Family History  Problem Relation Age of Onset   Coronary artery disease Mother 12   Cancer Father 19   Heart disease Father    Colon cancer Other    Diabetes Other    Coronary artery disease Sister    Diabetes type II Sister    Cancer Sister    Coronary artery disease Brother    Kidney disease Brother    Coronary artery disease Brother    Breast cancer Neg Hx    Social History   Socioeconomic History   Marital status: Single    Spouse name: Not on file   Number of children: 1  Years of education: 33   Highest education level: High school graduate  Occupational History   Not on file  Tobacco Use   Smoking status: Never   Smokeless tobacco: Never  Vaping Use   Vaping Use: Never used  Substance and Sexual Activity   Alcohol use: No   Drug use: No   Sexual activity: Not Currently  Other Topics Concern   Not on file  Social History Narrative   She tries to stay active, but over the last year (20 19-20 20), she has been very limited by her right knee pain.   Is able to go grocery shopping.   Social Determinants of Health   Financial Resource Strain: Low Risk  (12/25/2016)   Overall Financial Resource  Strain (CARDIA)    Difficulty of Paying Living Expenses: Not very hard  Food Insecurity: No Food Insecurity (05/29/2022)   Hunger Vital Sign    Worried About Running Out of Food in the Last Year: Never true    Ran Out of Food in the Last Year: Never true  Transportation Needs: No Transportation Needs (05/29/2022)   PRAPARE - Administrator, Civil Service (Medical): No    Lack of Transportation (Non-Medical): No  Physical Activity: Inactive (12/25/2016)   Exercise Vital Sign    Days of Exercise per Week: 0 days    Minutes of Exercise per Session: 0 min  Stress: No Stress Concern Present (12/25/2016)   [employer] of Occupational Health - Occupational Stress Questionnaire    Feeling of Stress : Only a little  Social Connections: Moderately Isolated (12/25/2016)   Social Connection and Isolation Panel [NHANES]    Frequency of Communication with Friends and Family: Once a week    Frequency of Social Gatherings with Friends and Family: Once a week    Attends Religious Services: More than 4 times per year    Active Member of [employer] or Organizations: No    Attends Banker Meetings: Never    Marital Status: Never married  Intimate Partner Violence: Not At Risk (05/29/2022)   Humiliation, Afraid, Rape, and Kick questionnaire    Fear of Current or Ex-Partner: No    Emotionally Abused: No    Physically Abused: No    Sexually Abused: No             Mardella Layman, MD 06/08/22 [phone]

## 2022-06-08 NOTE — Telephone Encounter (Signed)
Caller called for an update on discussing pt taking meds that are no longer prescribed to her. Caller also wanted to let PCP know that he received 2 group of level 2 med interactions in their system.   Med interactions are:  - Hydroxyzine and Potassium Chloride - Meclizine and Potassium Chloride

## 2022-06-09 ENCOUNTER — Telehealth: Payer: Self-pay | Admitting: Family Medicine

## 2022-06-09 NOTE — Telephone Encounter (Signed)
Please have her bring her medications to her next visit.  Casey [name]. [name], MD 06/09/2022 1:47 PM

## 2022-06-09 NOTE — Telephone Encounter (Signed)
See note   LVM with VO at [phone]

## 2022-06-09 NOTE — Telephone Encounter (Addendum)
Home Health Verbal Orders  Agency:  [facility]  Caller: Denny Peon, Nurse [phone]  Requesting OT/ PT/ Skilled nursing/ Social Work/ Speech: Skilled Nursing   Reason for Request:  Medication compliance and diabetic management  Frequency:  1 x 5 weeks    Caller also wants to confirm whether or not patient should be taking amlodipine. Caller also request glucometer be ordered since pt wants to check blood sugar levels.

## 2022-06-10 NOTE — Telephone Encounter (Signed)
Spoke with patient, aware to bring all medication to her visit for verification Verbalized understanding

## 2022-06-11 ENCOUNTER — Telehealth: Payer: Self-pay | Admitting: *Deleted

## 2022-06-11 ENCOUNTER — Encounter: Payer: Self-pay | Admitting: Family Medicine

## 2022-06-11 ENCOUNTER — Ambulatory Visit (INDEPENDENT_AMBULATORY_CARE_PROVIDER_SITE_OTHER): Payer: 59 | Admitting: Family Medicine

## 2022-06-11 ENCOUNTER — Other Ambulatory Visit: Payer: Self-pay | Admitting: *Deleted

## 2022-06-11 VITALS — BP 127/68 | HR 71 | Temp 98.2°F | Ht 62.0 in | Wt 160.8 lb

## 2022-06-11 DIAGNOSIS — I152 Hypertension secondary to endocrine disorders: Secondary | ICD-10-CM

## 2022-06-11 DIAGNOSIS — Z91148 Patient's other noncompliance with medication regimen for other reason: Secondary | ICD-10-CM

## 2022-06-11 DIAGNOSIS — E1140 Type 2 diabetes mellitus with diabetic neuropathy, unspecified: Secondary | ICD-10-CM

## 2022-06-11 DIAGNOSIS — E1159 Type 2 diabetes mellitus with other circulatory complications: Secondary | ICD-10-CM

## 2022-06-11 DIAGNOSIS — F321 Major depressive disorder, single episode, moderate: Secondary | ICD-10-CM

## 2022-06-11 DIAGNOSIS — N281 Cyst of kidney, acquired: Secondary | ICD-10-CM

## 2022-06-11 DIAGNOSIS — H8110 Benign paroxysmal vertigo, unspecified ear: Secondary | ICD-10-CM

## 2022-06-11 DIAGNOSIS — E785 Hyperlipidemia, unspecified: Secondary | ICD-10-CM

## 2022-06-11 DIAGNOSIS — G8929 Other chronic pain: Secondary | ICD-10-CM

## 2022-06-11 DIAGNOSIS — G4700 Insomnia, unspecified: Secondary | ICD-10-CM

## 2022-06-11 DIAGNOSIS — E1169 Type 2 diabetes mellitus with other specified complication: Secondary | ICD-10-CM

## 2022-06-11 DIAGNOSIS — F411 Generalized anxiety disorder: Secondary | ICD-10-CM | POA: Diagnosis not present

## 2022-06-11 DIAGNOSIS — Z7984 Long term (current) use of oral hypoglycemic drugs: Secondary | ICD-10-CM

## 2022-06-11 DIAGNOSIS — K279 Peptic ulcer, site unspecified, unspecified as acute or chronic, without hemorrhage or perforation: Secondary | ICD-10-CM

## 2022-06-11 DIAGNOSIS — E1129 Type 2 diabetes mellitus with other diabetic kidney complication: Secondary | ICD-10-CM

## 2022-06-11 DIAGNOSIS — F0390 Unspecified dementia without behavioral disturbance: Secondary | ICD-10-CM

## 2022-06-11 LAB — COMPREHENSIVE METABOLIC PANEL
ALT: 24 U/L (ref 0–35)
AST: 19 U/L (ref 0–37)
Albumin: 3.7 g/dL (ref 3.5–5.2)
Alkaline Phosphatase: 67 U/L (ref 39–117)
BUN: 13 mg/dL (ref 6–23)
CO2: 31 mEq/L (ref 19–32)
Calcium: 9 mg/dL (ref 8.4–10.5)
Chloride: 104 mEq/L (ref 96–112)
Creatinine, Ser: 1.01 mg/dL (ref 0.40–1.20)
GFR: 50.82 mL/min — ABNORMAL LOW (ref >60.00)
Glucose, Bld: 109 mg/dL — ABNORMAL HIGH (ref 70–99)
Potassium: 4.3 mEq/L (ref 3.5–5.1)
Sodium: 141 mEq/L (ref 135–145)
Total Bilirubin: 0.3 mg/dL (ref 0.2–1.2)
Total Protein: 6.2 g/dL (ref 6.0–8.3)

## 2022-06-11 LAB — CBC
HCT: 32.9 % — ABNORMAL LOW (ref 36.0–46.0)
Hemoglobin: 10.5 g/dL — ABNORMAL LOW (ref 12.0–15.0)
MCHC: 32 g/dL (ref 30.0–36.0)
MCV: 87.6 fl (ref 78.0–100.0)
Platelets: 321 10*3/uL (ref 150.0–400.0)
RBC: 3.75 Mil/uL — ABNORMAL LOW (ref 3.87–5.11)
RDW: 16.9 % — ABNORMAL HIGH (ref 11.5–15.5)
WBC: 7.1 10*3/uL (ref 4.0–10.5)

## 2022-06-11 MED ORDER — POLYETHYLENE GLYCOL 3350 17 GM/SCOOP PO POWD: 17.0000 g | Freq: Every day | ORAL | 0 refills | Status: DC

## 2022-06-11 MED ORDER — BLOOD GLUCOSE MONITORING SUPPL DEVI: 1.0000 | Freq: Three times a day (TID) | 0 refills | Status: DC

## 2022-06-11 MED ORDER — LANCETS MISC. MISC: 1.0000 | Freq: Three times a day (TID) | 0 refills | Status: DC

## 2022-06-11 MED ORDER — LANCET DEVICE MISC: 1.0000 | Freq: Three times a day (TID) | 0 refills | Status: AC

## 2022-06-11 MED ORDER — BLOOD GLUCOSE TEST VI STRP: 1.0000 | ORAL_STRIP | Freq: Three times a day (TID) | 0 refills | Status: DC

## 2022-06-11 MED ORDER — AMLODIPINE BESYLATE 2.5 MG PO TABS
2.5000 mg | ORAL_TABLET | Freq: Every day | ORAL | 3 refills | Status: DC
Start: 2022-06-11 — End: 2022-07-16

## 2022-06-11 NOTE — Assessment & Plan Note (Signed)
Symptoms are stable.  No longer on Protonix or Carafate.  Will remove these from her medication list today.

## 2022-06-11 NOTE — Assessment & Plan Note (Signed)
She is not taking BuSpar-we remove this from her medication list today.  She is not on hydroxyzine 10 to 20 mg 3 times daily as needed as well as Lexapro 10 mg daily.

## 2022-06-11 NOTE — Telephone Encounter (Signed)
[facility] faxed to [phone] Form placed to be scan in patient chart

## 2022-06-11 NOTE — Assessment & Plan Note (Signed)
A1c 8.0 in the hospital.  On Jardiance 10 mg daily.  Will recheck again in a few months.  Due to recurrent issues with AKI will avoid metformin going forward.

## 2022-06-11 NOTE — Patient Instructions (Signed)
It was very nice to see you today!  We will check blood work today.  We updated your medication list today.  The only change today we will be making and stopping your lisinopril and starting low-dose amlodipine.  Please see your attached medication list and give this to the home health nurse.  Return in about 2 weeks (around 06/25/2022) for Follow Up.   Take care, Dr [name]  PLEASE NOTE:  If you had any lab tests, please let us know if you have not heard back within a few days. You may see your results on mychart before we have a chance to review them but we will give you a call once they are reviewed by [name].   If we ordered any referrals today, please let us know if you have not heard from their office within the next week.   If you had any urgent prescriptions sent in today, please check with the pharmacy within an hour of our visit to make sure the prescription was transmitted appropriately.   Please try these tips to maintain a healthy lifestyle:  Eat at least 3 REAL meals and 1-2 snacks per day.  Aim for no more than 5 hours between eating.  If you eat breakfast, please do so within one hour of getting up.   Each meal should contain half fruits/vegetables, one quarter protein, and one quarter carbs (no bigger than a computer mouse)  Cut down on sweet beverages. This includes juice, soda, and sweet tea.   Drink at least 1 glass of water with each meal and aim for at least 8 glasses per day  Exercise at least 150 minutes every week.

## 2022-06-11 NOTE — Telephone Encounter (Signed)
Please advise 

## 2022-06-11 NOTE — Progress Notes (Signed)
Chief Complaint:  Casey [name] is a 87 y.o. female who presents today for a TCM visit.  Assessment/Plan:  New/Acute Problems: AKI Resolved prior to discharge.  Will recheck labs today.  Likely exacerbated by dehydration and medications.   Chronic Problems Addressed Today: Poor compliance with medication Spent a significant amount of time on today's visit reconciling her medications and discussing the importance of being adherent to medications that were prescribed.  We have updated her medication list.  She does have a home health nurse is helping her now with medication adherence.  Advised her to give the full medication list to the home health nurse as well.  Will refer her to see the in-house pharmacy to help with medication adherence as well.  Renal cyst, Bilateral Has MRI planned for tomorrow to further evaluate.  Hypertension associated with diabetes (HCC) Blood pressure is at goal today.  She has been taking lisinopril 40 mg daily at home.  She is not taking amlodipine or HCTZ.  With her recent AKI and previous issues with electrolyte derangements secondary to previous mild AKI we should probably avoid ACE/ARB and any diuretics at this point.  We will switch her back to low-dose amlodipine 2.5 mg daily.  We kept her up out of lisinopril and will dispose of this.  She will come back to see me in a couple weeks and we can recheck blood pressure at that point.  We can titrate dose as needed.  GAD (generalized anxiety disorder) She is not taking BuSpar-we remove this from her medication list today.  She is not on hydroxyzine 10 to 20 mg 3 times daily as needed as well as Lexapro 10 mg daily.  Type 2 diabetes mellitus with microalbuminuria, without long-term current use of insulin (HCC) A1c 8.0 in the hospital.  On Jardiance 10 mg daily.  Will recheck again in a few months.  Due to recurrent issues with AKI will avoid metformin going forward.  PUD (peptic ulcer disease) Symptoms  are stable.  No longer on Protonix or Carafate.  Will remove these from her medication list today.  Dementia without behavioral disturbance (HCC) Follows with neurology.  She is not currently on any medications.  Neurology had previously prescribed rivastigmine patch however she never picked this up from the pharmacy.  No longer on donepezil.  Depression, major, single episode, moderate (HCC) She is not on Remeron or Seroquel.  Will take both of these off of her medication list.  She is on Lexapro 10 mg daily which we will add back onto her medication list.  Her symptoms are stable.  She has a referral to psychiatry pending.  Insomnia Not currently on any medications.  Will take Seroquel and Remeron off medication list.  Chronic pain On Norco 5-3 25 every 8 hours as needed for pain.  Does not need refill today.  Also had gabapentin added on at the hospital.  She is on 100 mg 3 times daily.  Tolerating this well.  Chronic painful diabetic neuropathy (HCC) On gabapentin 100 mg 3 times daily.  Tolerating well.  Dyslipidemia associated with type 2 diabetes mellitus (HCC) Recheck lipids with next blood draw.  She is not currently on atorvastatin.  Will take this off her medication list.  May need to restart at some point the future depending on lipid panel.  BENIGN POSITIONAL VERTIGO On meclizine 25 mg 3 times daily as needed.    Subjective:  HPI:  Summary of Hospital admission: Reason for admission:  AKI Date of admission: 05/29/2022 Date of discharge: 06/02/2022 Date of Interactive contact: 06/03/2022 Summary of Hospital course: Patient presented to the ED on 05/29/2022 with fatigue and weakness.  A neighbor was concerned and called EMS who took her to the Emergency Department. Found to have AKI with BUN of 87 and creatinine 7.04.  CT scan and urine culture were negative.  She was fluid resuscitated and her renal function improved.  She was instructed to discontinue all of her blood pressure  meds.  It was thought her AKI was due to dehydration and medication side effect including lisinopril that she had been taking.  Interim history:  She has been doing well since being home for the last week or so.  She did go to urgent care a few days ago for sinus infection symptoms and was started on amoxicillin.  This seems to be improving.  She brought her medications with her that she has been taking at home.  Please see medication list for full medication reconciliation.  She has been continuing to take the Lexapro despite a stopping it a few months ago.  She has also been on meclizine 25 mg 3 times daily.  There were several medications on her medical record list that she is not taking and we will remove those off of her list today.  She is no longer taking the BuSpar, quetiapine, Lipitor, Carafate, magnesium, Zofran, potassium, Remeron, rivastigmine, or Protonix.   ROS: Per HPI, otherwise a complete review of systems was negative.   PMH:  The following were reviewed and entered/updated in epic: Past Medical History:  Diagnosis Date   ALLERGIC RHINITIS    Allergy    ANXIETY    ARM PAIN, RIGHT    BENIGN POSITIONAL VERTIGO    Carcinoma in situ of colon    CONSTIPATION    CYSTOCELE WITH INCOMPLETE UTERINE PROLAPSE    Diabetes mellitus without complication (HCC)    ESOPHAGEAL STRICTURE    FATIGUE    FLANK PAIN, LEFT    Headache(784.0)    HIP PAIN, LEFT    HYPERCHOLESTEROLEMIA    HYPERGLYCEMIA    HYPERTENSION, BENIGN ESSENTIAL 03/03/2007   LEG EDEMA, BILATERAL    Microscopic hematuria    OBESITY    OSTEOARTHRITIS, KNEE    OSTEOPOROSIS    Peripheral neuropathy, idiopathic    Problems with swallowing and mastication    SINUSITIS, RECURRENT    SPRAIN&STRAIN OTH SPEC SITES SHOULDER&UPPER ARM    Patient Active Problem List   Diagnosis Date Noted   Poor compliance with medication 06/11/2022   Chronic painful diabetic neuropathy (HCC) 06/11/2022   Debility 04/29/2022   Renal  cyst, Bilateral 02/12/2022   Chronic pain 12/26/2021   Depression, major, single episode, moderate (HCC) 09/26/2021   Insomnia 09/26/2021   Dementia without behavioral disturbance (HCC) 01/03/2020   PUD (peptic ulcer disease) 07/17/2019   Vitamin B12 deficiency 07/14/2017   Osteoarthritis 07/07/2013   Type 2 diabetes mellitus with microalbuminuria, without long-term current use of insulin (HCC) 05/26/2012   Long Q-T syndrome 11/26/2010   CYSTOCELE WITH INCOMPLETE UTERINE PROLAPSE 03/10/2010   MICROSCOPIC HEMATURIA 07/29/2009   Sinusitis, chronic 02/23/2008   BENIGN POSITIONAL VERTIGO 04/25/2007   Dyslipidemia associated with type 2 diabetes mellitus (HCC) 03/03/2007   GAD (generalized anxiety disorder) 03/03/2007   Hypertension associated with diabetes (HCC) 03/03/2007   Allergic rhinitis 03/03/2007   Osteoporosis 03/03/2007   Past Surgical History:  Procedure Laterality Date   BIOPSY  02/06/2022   Procedure: BIOPSY;  Surgeon: Kathi Der, MD;  Location: [facility] ENDOSCOPY;  Service: Gastroenterology;;   COLONOSCOPY     ESOPHAGOGASTRODUODENOSCOPY (EGD) WITH PROPOFOL N/A 02/06/2022   Procedure: ESOPHAGOGASTRODUODENOSCOPY (EGD) WITH PROPOFOL;  Surgeon: Kathi Der, MD;  Location: [facility] ENDOSCOPY;  Service: Gastroenterology;  Laterality: N/A;   KNEE ARTHROPLASTY Right 07/24/2020   Procedure: COMPUTER ASSISTED TOTAL KNEE ARTHROPLASTY;  Surgeon: Samson Frederic, MD;  Location: [facility] ORS;  Service: Orthopedics;  Laterality: Right;   LEFT HEART CATHETERIZATION WITH CORONARY ANGIOGRAM N/A 12/02/2010   Procedure: LEFT HEART CATHETERIZATION WITH CORONARY ANGIOGRAM;  Surgeon: Peter M Swaziland, MD;  Location: [facility] CATH LAB;  Service: Cardiovascular;:: NORMAL CORONARY ARTERIES - NORMAL LV FUNCTION & EDP.     TRANSTHORACIC ECHOCARDIOGRAM  11/2010   Normal EF 60-65%. No RWMA.  Mild LAD dilation c/w Gr 1 DD.  -- PRETTY NORMAL ECHO    Family History  Problem Relation Age of Onset   Coronary artery  disease Mother 19   Cancer Father 46   Heart disease Father    Colon cancer Other    Diabetes Other    Coronary artery disease Sister    Diabetes type II Sister    Cancer Sister    Coronary artery disease Brother    Kidney disease Brother    Coronary artery disease Brother    Breast cancer Neg Hx     Medications- Reconciled discharge and current medications in Epic.  Current Outpatient Medications  Medication Sig Dispense Refill   amLODipine (NORVASC) 2.5 MG tablet Take 1 tablet (2.5 mg total) by mouth daily. 90 tablet 3   amoxicillin (AMOXIL) 875 MG tablet Take 1 tablet (875 mg total) by mouth 2 (two) times daily for 10 days. 20 tablet 0   aspirin 81 MG chewable tablet Chew 1 tablet (81 mg total) by mouth 2 (two) times daily.     azelastine (ASTELIN) 0.1 % nasal spray INSTILL 1 SPRAY INTO BOTH NOSTRILS TWICE DAILY (Patient taking differently: Place 1 spray into both nostrils 2 (two) times daily.) 30 mL 12   azelastine (OPTIVAR) 0.05 % ophthalmic solution Place 1 drop into both eyes 2 (two) times daily.     Carboxymethylcellul-Glycerin (REFRESH OPTIVE) 1-0.9 % GEL Place 1 drop into both eyes daily.     empagliflozin (JARDIANCE) 10 MG TABS tablet TAKE 1 TABLET(10 MG) BY MOUTH DAILY BEFORE BREAKFAST (Patient taking differently: Take 10 mg by mouth daily.) 90 tablet 0   escitalopram (LEXAPRO) 10 MG tablet Take 10 mg by mouth daily.     gabapentin (NEURONTIN) 100 MG capsule Take 1 capsule (100 mg total) by mouth 3 (three) times daily. 90 capsule 0   HYDROcodone-acetaminophen (NORCO/VICODIN) 5-325 MG tablet Take 1 tablet by mouth every 8 (eight) hours as needed for moderate pain. For chronic pain 90 tablet 0   hydrOXYzine (ATARAX) 10 MG tablet Take 1-2 tablets (10-20 mg total) by mouth 3 (three) times daily as needed. (Patient taking differently: Take 10-20 mg by mouth 3 (three) times daily as needed for anxiety.) 180 tablet 1   meclizine (ANTIVERT) 25 MG tablet Take 1 tablet (25 mg total) by  mouth 3 (three) times daily as needed for dizziness. 90 tablet 3   meclizine (ANTIVERT) 25 MG tablet Take 25 mg by mouth 3 (three) times daily as needed for dizziness.     Multiple Vitamins-Minerals (CERTAVITE/ANTIOXIDANTS) TABS Take 1 tablet by mouth at bedtime. 130 tablet 0   senna-docusate (SENOKOT-S) 8.6-50 MG tablet Take 1 tablet by mouth at bedtime as needed  for mild constipation. 30 tablet 0   polyethylene glycol powder (GLYCOLAX/MIRALAX) 17 GM/SCOOP powder Take 17 g by mouth daily. 238 g 0   No current facility-administered medications for this visit.    Allergies-reviewed and updated No Known Allergies  Social History   Socioeconomic History   Marital status: Single    Spouse name: Not on file   Number of children: 1   Years of education: 12   Highest education level: High school graduate  Occupational History   Not on file  Tobacco Use   Smoking status: Never   Smokeless tobacco: Never  Vaping Use   Vaping Use: Never used  Substance and Sexual Activity   Alcohol use: No   Drug use: No   Sexual activity: Not Currently  Other Topics Concern   Not on file  Social History Narrative   She tries to stay active, but over the last year (20 19-20 20), she has been very limited by her right knee pain.   Is able to go grocery shopping.   Social Determinants of Health   Financial Resource Strain: Low Risk  (12/25/2016)   Overall Financial Resource Strain (CARDIA)    Difficulty of Paying Living Expenses: Not very hard  Food Insecurity: No Food Insecurity (05/29/2022)   Hunger Vital Sign    Worried About Running Out of Food in the Last Year: Never true    Ran Out of Food in the Last Year: Never true  Transportation Needs: No Transportation Needs (05/29/2022)   PRAPARE - Administrator, Civil Service (Medical): No    Lack of Transportation (Non-Medical): No  Physical Activity: Inactive (12/25/2016)   Exercise Vital Sign    Days of Exercise per Week: 0 days     Minutes of Exercise per Session: 0 min  Stress: No Stress Concern Present (12/25/2016)   [employer] of Occupational Health - Occupational Stress Questionnaire    Feeling of Stress : Only a little  Social Connections: Moderately Isolated (12/25/2016)   Social Connection and Isolation Panel [NHANES]    Frequency of Communication with Friends and Family: Once a week    Frequency of Social Gatherings with Friends and Family: Once a week    Attends Religious Services: More than 4 times per year    Active Member of [employer] or Organizations: No    Attends Engineer, structural: Never    Marital Status: Never married        Objective:  Physical Exam: BP 127/68   Pulse 71   Temp 98.2 F (36.8 C) (Temporal)   Ht 5\' 2"  (1.575 m)   Wt 160 lb 12.8 oz (72.9 kg)   SpO2 100%   BMI 29.41 kg/m   Gen: NAD, resting comfortably CV: RRR with no murmurs appreciated Pulm: NWOB, CTAB with no crackles, wheezes, or rhonchi GI: Normal bowel sounds present. Soft, Nontender, Nondistended. MSK: No edema, cyanosis, or clubbing noted Skin: Warm, dry Neuro: Grossly normal, moves all extremities Psych: Normal affect and thought content  Time Spent: 45 minutes of total time was spent on the date of the encounter performing the following actions: chart review prior to seeing the patient including recent hospitalization, obtaining history, performing a medically necessary exam, medication reconciliation, counseling on the treatment plan, placing orders, and documenting in our EHR.       [name]. [name], MD 06/11/2022 12:42 PM

## 2022-06-11 NOTE — Assessment & Plan Note (Signed)
She is not on Remeron or Seroquel.  Will take both of these off of her medication list.  She is on Lexapro 10 mg daily which we will add back onto her medication list.  Her symptoms are stable.  She has a referral to psychiatry pending.

## 2022-06-11 NOTE — Assessment & Plan Note (Signed)
Has MRI planned for tomorrow to further evaluate.

## 2022-06-11 NOTE — Assessment & Plan Note (Signed)
On gabapentin 100 mg 3 times daily.  Tolerating well.

## 2022-06-11 NOTE — Assessment & Plan Note (Signed)
Spent a significant amount of time on today's visit reconciling her medications and discussing the importance of being adherent to medications that were prescribed.  We have updated her medication list.  She does have a home health nurse is helping her now with medication adherence.  Advised her to give the full medication list to the home health nurse as well.  Will refer her to see the in-house pharmacy to help with medication adherence as well.

## 2022-06-11 NOTE — Assessment & Plan Note (Signed)
On meclizine 25 mg 3 times daily as needed.

## 2022-06-11 NOTE — Assessment & Plan Note (Signed)
On Norco 5-3 25 every 8 hours as needed for pain.  Does not need refill today.  Also had gabapentin added on at the hospital.  She is on 100 mg 3 times daily.  Tolerating this well.

## 2022-06-11 NOTE — Assessment & Plan Note (Signed)
Recheck lipids with next blood draw.  She is not currently on atorvastatin.  Will take this off her medication list.  May need to restart at some point the future depending on lipid panel.

## 2022-06-11 NOTE — Assessment & Plan Note (Signed)
Blood pressure is at goal today.  She has been taking lisinopril 40 mg daily at home.  She is not taking amlodipine or HCTZ.  With her recent AKI and previous issues with electrolyte derangements secondary to previous mild AKI we should probably avoid ACE/ARB and any diuretics at this point.  We will switch her back to low-dose amlodipine 2.5 mg daily.  We kept her up out of lisinopril and will dispose of this.  She will come back to see me in a couple weeks and we can recheck blood pressure at that point.  We can titrate dose as needed.

## 2022-06-11 NOTE — Telephone Encounter (Signed)
Patient in office to day, bring mediation with her  Check with current medication list

## 2022-06-11 NOTE — Assessment & Plan Note (Signed)
Not currently on any medications.  Will take Seroquel and Remeron off medication list.

## 2022-06-11 NOTE — Assessment & Plan Note (Signed)
Follows with neurology.  She is not currently on any medications.  Neurology had previously prescribed rivastigmine patch however she never picked this up from the pharmacy.  No longer on donepezil.

## 2022-06-11 NOTE — Telephone Encounter (Signed)
[name] should not be on this. She needs to bring all medications with her at her next visit.  Casey [name]. [name], MD 06/11/2022 9:25 AM

## 2022-06-12 ENCOUNTER — Telehealth: Payer: Self-pay

## 2022-06-12 ENCOUNTER — Ambulatory Visit (HOSPITAL_COMMUNITY)
Admission: RE | Admit: 2022-06-12 | Discharge: 2022-06-12 | Disposition: A | Discharge status: Home / Self Care | Payer: 59 | Source: Ambulatory Visit | Attending: Family Medicine | Admitting: Family Medicine

## 2022-06-12 ENCOUNTER — Telehealth: Payer: Self-pay | Admitting: Family Medicine

## 2022-06-12 DIAGNOSIS — N2889 Other specified disorders of kidney and ureter: Secondary | ICD-10-CM | POA: Insufficient documentation

## 2022-06-12 MED ORDER — GADOBUTROL 1 MMOL/ML IV SOLN
7.0000 mL | Freq: Once | INTRAVENOUS | Status: AC | PRN
  Administered 2022-06-12: 7 mL via INTRAVENOUS

## 2022-06-12 NOTE — Progress Notes (Signed)
Labs are all stable.  Do not need to do any other testing at this point.  We can recheck again at next office visit.

## 2022-06-12 NOTE — Telephone Encounter (Signed)
Patient dropped off document Home Health Certificate (Order ID [id number]), to be filled out by provider. Patient requested to send it via Fax [phone] within ASAP. Document is located in providers tray at front office.Please advise

## 2022-06-12 NOTE — Progress Notes (Signed)
  Chronic Care Management   Note  06/12/2022 Name: KASHYRA BONTEMPO MRN: [id number] DOB: November 26, 1935  [name] is a 87 y.o. year old female who is a primary care patient of [name], [name], MD. I reached out to [name] by phone today in response to a referral sent by Ms. Rexene Alberts Lewan's PCP.  The first contact attempt was unsuccessful.   Follow up plan: Additional outreach attempts will be made.  Penne Lash, RMA Care Guide [facility]  [place], [place] [address] Direct Dial: [phone] [email or web address]

## 2022-06-16 NOTE — Telephone Encounter (Signed)
Erin with [facility] requests to be called for status of request from 06/09/22 below "Caller also request glucometer be ordered since pt wants to check blood sugar levels."

## 2022-06-16 NOTE — Telephone Encounter (Signed)
Form faxed to [phone]  Form placed to be scan in patient chart

## 2022-06-16 NOTE — Telephone Encounter (Signed)
Form placed in PCP office to be reviewed  

## 2022-06-17 ENCOUNTER — Other Ambulatory Visit: Payer: Self-pay | Admitting: *Deleted

## 2022-06-17 MED ORDER — LANCETS MISC. MISC: 1.0000 | Freq: Three times a day (TID) | 0 refills | Status: AC

## 2022-06-17 MED ORDER — BLOOD GLUCOSE TEST VI STRP: 1.0000 | ORAL_STRIP | Freq: Three times a day (TID) | 0 refills | Status: AC

## 2022-06-17 MED ORDER — BLOOD GLUCOSE MONITORING SUPPL DEVI: 1.0000 | Freq: Three times a day (TID) | 0 refills | Status: DC

## 2022-06-17 NOTE — Telephone Encounter (Signed)
Rx send on 06/11/2022 Denny Peon stated pharmacy did not received order  Rx resend to day to pt pharmacy

## 2022-06-19 NOTE — Progress Notes (Signed)
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  Do not need to make any changes at this time but we need to repeat again in 6 months.   Please place order for MR abdomen with and without contrast to be repeated in 6 months.

## 2022-06-23 ENCOUNTER — Other Ambulatory Visit: Payer: Self-pay | Admitting: Family Medicine

## 2022-06-23 ENCOUNTER — Telehealth: Payer: Self-pay | Admitting: Family Medicine

## 2022-06-23 DIAGNOSIS — M170 Bilateral primary osteoarthritis of knee: Secondary | ICD-10-CM

## 2022-06-23 NOTE — Telephone Encounter (Signed)
Prescription Request  06/23/2022  LOV: 06/11/2022  What is the name of the medication or equipment?  HYDROcodone-acetaminophen (NORCO/VICODIN) 5-325 MG tablet   Have you contacted your pharmacy to request a refill? Yes   Which pharmacy would you like this sent to?  [id number] - [place], [place] - [address] AT [address] [place] [address] Phone: [phone] Fax: [phone]     Patient notified that their request is being sent to the clinical staff for review and that they should receive a response within 2 business days.   Please advise at Mobile [phone] (mobile)

## 2022-06-24 ENCOUNTER — Telehealth: Payer: Self-pay | Admitting: Family Medicine

## 2022-06-24 MED ORDER — HYDROCODONE-ACETAMINOPHEN 5-325 MG PO TABS
1.0000 | ORAL_TABLET | Freq: Three times a day (TID) | ORAL | 0 refills | Status: DC | PRN
Start: 2022-06-24 — End: 2022-07-22

## 2022-06-24 NOTE — Telephone Encounter (Signed)
FYI   Caller states: - Saw pt 1st time today in 3 weeks - Patient mobility improved, PT goals have been met and PT will be discharged  - Out of gabapentin for about 1 week - Patient is still c/o left flank pain, pain is mostly 0 out of 10 but sometimes reaches a 10 on pain scale. Pt points to [facility]; no source found - Patient c/o chronic dizziness onset possibly years; no other sx; lasts 3 hours every morning; take meclizine but not effective even when doubled - Caller's great concern is patient's dementia and her taking meds incorrectly as prescribed. She does still have skilled nursing services.   For additional info, Rosanne Ashing can be contacted @ [phone].

## 2022-06-24 NOTE — Telephone Encounter (Signed)
See note

## 2022-06-25 ENCOUNTER — Ambulatory Visit (INDEPENDENT_AMBULATORY_CARE_PROVIDER_SITE_OTHER): Payer: 59 | Admitting: Family Medicine

## 2022-06-25 ENCOUNTER — Encounter: Payer: Self-pay | Admitting: Family Medicine

## 2022-06-25 VITALS — BP 132/73 | HR 71 | Temp 96.3°F | Ht 62.0 in | Wt 161.6 lb

## 2022-06-25 DIAGNOSIS — E1129 Type 2 diabetes mellitus with other diabetic kidney complication: Secondary | ICD-10-CM

## 2022-06-25 DIAGNOSIS — Z7984 Long term (current) use of oral hypoglycemic drugs: Secondary | ICD-10-CM

## 2022-06-25 DIAGNOSIS — Z91148 Patient's other noncompliance with medication regimen for other reason: Secondary | ICD-10-CM

## 2022-06-25 DIAGNOSIS — E1159 Type 2 diabetes mellitus with other circulatory complications: Secondary | ICD-10-CM | POA: Diagnosis not present

## 2022-06-25 DIAGNOSIS — I152 Hypertension secondary to endocrine disorders: Secondary | ICD-10-CM

## 2022-06-25 DIAGNOSIS — F0390 Unspecified dementia without behavioral disturbance: Secondary | ICD-10-CM | POA: Diagnosis not present

## 2022-06-25 DIAGNOSIS — R809 Proteinuria, unspecified: Secondary | ICD-10-CM

## 2022-06-25 MED ORDER — GABAPENTIN 100 MG PO CAPS: 100.0000 mg | ORAL_CAPSULE | Freq: Three times a day (TID) | ORAL | 5 refills | Status: DC

## 2022-06-25 NOTE — Assessment & Plan Note (Signed)
Stable on Jardiance 10 mg daily.  Too early to recheck A1c.  We can recheck at a future office visit.

## 2022-06-25 NOTE — Assessment & Plan Note (Signed)
Blood pressure much better controlled today.  She is now just on amlodipine 2.5 mg daily.  She does not have her medications with her today however home health nursing has been helping with medication adherence at home.  Will continue her regimen as is.  Will recheck again in a month.

## 2022-06-25 NOTE — Assessment & Plan Note (Signed)
Follows with neurology but is not currently on any medications.  We have home health helping with medication assistance as above.  Patient currently feels safe at home and is happy with her living situation.

## 2022-06-25 NOTE — Assessment & Plan Note (Signed)
Home health nursing is helping with this.  We also referred her to care management however this has not yet been set up.  She will come back in a month for follow-up visit.

## 2022-06-25 NOTE — Patient Instructions (Addendum)
It was very nice to see you today!  I will refill your medication.  Will try another round of physical therapy.  Return in about 1 month (around 07/25/2022).   Take care, Dr [name]  PLEASE NOTE:  If you had any lab tests, please let us know if you have not heard back within a few days. You may see your results on mychart before we have a chance to review them but we will give you a call once they are reviewed by [name].   If we ordered any referrals today, please let us know if you have not heard from their office within the next week.   If you had any urgent prescriptions sent in today, please check with the pharmacy within an hour of our visit to make sure the prescription was transmitted appropriately.   Please try these tips to maintain a healthy lifestyle:  Eat at least 3 REAL meals and 1-2 snacks per day.  Aim for no more than 5 hours between eating.  If you eat breakfast, please do so within one hour of getting up.   Each meal should contain half fruits/vegetables, one quarter protein, and one quarter carbs (no bigger than a computer mouse)  Cut down on sweet beverages. This includes juice, soda, and sweet tea.   Drink at least 1 glass of water with each meal and aim for at least 8 glasses per day  Exercise at least 150 minutes every week.

## 2022-06-25 NOTE — Progress Notes (Signed)
Casey [name] is a 87 y.o. female who presents today for an office visit.  Assessment/Plan:  New/Acute Problems: Abdominal Pain She had an abdominal MRI last week which did not show any significant intra-abdominal abnormalities aside from the renal cysts that we were evaluating.  Is possible she could have a muscular strain.  We did discuss referral to PT and she is agreeable to this.  If not improving over the next 1 to 2 weeks would consider repeat imaging with CT scan with contrast versus referral to sports medicine.  Chronic Problems Addressed Today: Hypertension associated with diabetes (HCC) Blood pressure much better controlled today.  She is now just on amlodipine 2.5 mg daily.  She does not have her medications with her today however home health nursing has been helping with medication adherence at home.  Will continue her regimen as is.  Will recheck again in a month.  Poor compliance with medication Home health nursing is helping with this.  We also referred her to care management however this has not yet been set up.  She will come back in a month for follow-up visit.  Type 2 diabetes mellitus with microalbuminuria, without long-term current use of insulin (HCC) Stable on Jardiance 10 mg daily.  Too early to recheck A1c.  We can recheck at a future office visit.  Dementia without behavioral disturbance (HCC) Follows with neurology but is not currently on any medications.  We have home health helping with medication assistance as above.  Patient currently feels safe at home and is happy with her living situation.    Subjective:  HPI:  See Assessment / plan for status of chronic conditions.   She is here today for follow up. She was last seen a couple of weeks ago for hospital follow-up.  There was significant concern at that time for medication adherence and we extensively reviewed and simplified her medication regimen at that visit.  At her last visit we adjusted her home  blood pressure meds to only amlodipine 2.5 mg daily.  We had her discontinue her lisinopril and also kept her off her hydrochloroTHIAZIDE.  She did have some AKI that resolved at discharge.  Repeat labs a couple weeks ago were stable as well.  We did refer her to home health to help with medication insistence due to concern for medication adherence.  They have started coming out to her house and have been out once.  They will be coming again next week to help her with medication assistance.  She has also been working with home health physical therapy and was discharged from them yesterday due to meeting her PT goals.  Overall feels well today.  She is doing well with medication.  No changes since her last visit.  She has had more left lower abdominal pain for the last year or so.  Seems to be worsening recently.  Worse with certain positions.       Objective:  Physical Exam: BP 132/73   Pulse 71   Temp (!) 96.3 F (35.7 C) (Temporal)   Ht 5\' 2"  (1.575 m)   Wt 161 lb 9.6 oz (73.3 kg)   SpO2 99%   BMI 29.56 kg/m   Gen: No acute distress, resting comfortably CV: Regular rate and rhythm with no murmurs appreciated Pulm: Normal work of breathing, clear to auscultation bilaterally with no crackles, wheezes, or rhonchi Abdomen: Soft, nontender, nondistended.  Mild tenderness palpation along left abdominal wall.  No masses appreciated. Neuro: Grossly  normal, moves all extremities Psych: Normal affect and thought content      Bulmaro Feagans M. [name], MD 06/25/2022 10:04 AM

## 2022-07-06 ENCOUNTER — Telehealth: Payer: Self-pay | Admitting: Family Medicine

## 2022-07-06 ENCOUNTER — Other Ambulatory Visit: Payer: Self-pay | Admitting: Family Medicine

## 2022-07-06 ENCOUNTER — Telehealth: Payer: Self-pay | Admitting: *Deleted

## 2022-07-06 NOTE — Telephone Encounter (Signed)
Caller is calling to request an increased dosage of remeron 7/5 be sent in for patient.   Caller states: - Patient has been taking 2-3 tablets of remeron 7.5 mg daily to help with sleep  - Last filled by hospitalist on 5/10 (90 day supply) but she ran out due to self increased dosage usage.   - Currently has refill @ pharmacy but not due for fill until 7/28  Please advise.

## 2022-07-06 NOTE — Telephone Encounter (Signed)
[facility] home health care form faxed today to [phone] Order #[id number] Form placed to be scan in patient chart

## 2022-07-07 NOTE — Telephone Encounter (Signed)
Spoke with patient, patient stated not taking Remeron  Not on current medication list

## 2022-07-07 NOTE — Telephone Encounter (Signed)
Can we please verify dose with patient directly?  Casey [name]. [name], MD 07/07/2022 11:56 AM

## 2022-07-07 NOTE — Telephone Encounter (Signed)
Lvm to Denny Peon [phone] to return call

## 2022-07-07 NOTE — Telephone Encounter (Signed)
Noted.  We took this off med list at her last visit as she was not taking it.  Recommend against restarting at this point.  Casey [name]. [name], MD 07/07/2022 3:06 PM

## 2022-07-07 NOTE — Telephone Encounter (Signed)
Please advise 

## 2022-07-10 ENCOUNTER — Ambulatory Visit: Payer: 59 | Admitting: Family Medicine

## 2022-07-15 ENCOUNTER — Telehealth: Payer: Self-pay

## 2022-07-15 NOTE — Progress Notes (Signed)
  Chronic Care Management   Note  07/15/2022 Name: [name] MRN: [id number] DOB: 08/12/1935  Casey [name] is a 87 y.o. year old female who is a primary care patient of [name], [name], MD. I reached out to Casey [name] by phone today in response to a referral sent by Ms. Rexene Alberts Vessel's PCP.  The first contact attempt was unsuccessful.   Follow up plan: Additional outreach attempts will be made.  Penne Lash, RMA Care Guide [facility]  [place], [place] [address] Direct Dial: [phone] [email or web address]

## 2022-07-16 ENCOUNTER — Ambulatory Visit (INDEPENDENT_AMBULATORY_CARE_PROVIDER_SITE_OTHER): Payer: 59 | Admitting: Family Medicine

## 2022-07-16 ENCOUNTER — Encounter: Payer: Self-pay | Admitting: Family Medicine

## 2022-07-16 VITALS — BP 118/70 | HR 78 | Temp 97.7°F | Ht 62.0 in | Wt 160.5 lb

## 2022-07-16 DIAGNOSIS — I152 Hypertension secondary to endocrine disorders: Secondary | ICD-10-CM | POA: Diagnosis not present

## 2022-07-16 DIAGNOSIS — F321 Major depressive disorder, single episode, moderate: Secondary | ICD-10-CM

## 2022-07-16 DIAGNOSIS — F411 Generalized anxiety disorder: Secondary | ICD-10-CM

## 2022-07-16 DIAGNOSIS — Z91148 Patient's other noncompliance with medication regimen for other reason: Secondary | ICD-10-CM

## 2022-07-16 DIAGNOSIS — E1159 Type 2 diabetes mellitus with other circulatory complications: Secondary | ICD-10-CM | POA: Diagnosis not present

## 2022-07-16 DIAGNOSIS — F0390 Unspecified dementia without behavioral disturbance: Secondary | ICD-10-CM

## 2022-07-16 DIAGNOSIS — R809 Proteinuria, unspecified: Secondary | ICD-10-CM

## 2022-07-16 MED ORDER — BUSPIRONE HCL 10 MG PO TABS: 10.0000 mg | ORAL_TABLET | Freq: Two times a day (BID) | ORAL | 5 refills | Status: DC

## 2022-07-16 MED ORDER — AMLODIPINE BESYLATE 2.5 MG PO TABS: 5.0000 mg | ORAL_TABLET | Freq: Every day | ORAL | 3 refills | Status: DC

## 2022-07-16 NOTE — Patient Instructions (Signed)
It was very nice to see you today!  We will increase your BuSpar to 10 mg twice daily.  Please continue with all your other medications.    We will check blood work today.  Please call [phone]  to schedule with the care management team.   Return in about 2 weeks (around 07/30/2022).   Take care, Dr [name]  PLEASE NOTE:  If you had any lab tests, please let us know if you have not heard back within a few days. You may see your results on mychart before we have a chance to review them but we will give you a call once they are reviewed by [name].   If we ordered any referrals today, please let us know if you have not heard from their office within the next week.   If you had any urgent prescriptions sent in today, please check with the pharmacy within an hour of our visit to make sure the prescription was transmitted appropriately.   Please try these tips to maintain a healthy lifestyle:  Eat at least 3 REAL meals and 1-2 snacks per day.  Aim for no more than 5 hours between eating.  If you eat breakfast, please do so within one hour of getting up.   Each meal should contain half fruits/vegetables, one quarter protein, and one quarter carbs (no bigger than a computer mouse)  Cut down on sweet beverages. This includes juice, soda, and sweet tea.   Drink at least 1 glass of water with each meal and aim for at least 8 glasses per day  Exercise at least 150 minutes every week.

## 2022-07-16 NOTE — Progress Notes (Signed)
Casey [name] is a 87 y.o. female who presents today for an office visit.  Assessment/Plan:  Chronic Problems Addressed Today: Hypertension associated with diabetes (HCC) Blood pressure at goal today.  Apparently she has been taking amlodipine 5 mg daily.  Given that her pressure is well-controlled we will continue this dose for now.  Will update her medication list to reflect this change.  She will monitor at home and let us know if persistently elevated.  We can recheck again in a couple of weeks.  GAD (generalized anxiety disorder) Had a lengthy discussion with patient regarding medications.  We did discuss importance of medication compliance so that we can make sure that we are not duplicating therapy and not causing any issues with possible interactions.  She is no longer on Lexapro and she did not feel like it was effective-we will take this off her medication list.  She has been taking hydroxyzine 10-20 mg 3 times daily.  Will increase her BuSpar to 10 mg daily.  We had referred to psychiatry however they were unable to get in contact with her.  Will give contact information for her to call and schedule appointment.  Depression, major, single episode, moderate (HCC) See anxiety Assessment / plan. We are adjusting dose of medications as above.  We have had significant issues with medication compliance.  Will increase the dose of BuSpar to 10 mg twice daily.  Will take Lexapro off of her medication list as she has been off of this.  She will come back in a couple of weeks.  Advised her to bring all medications to her follow-up visit.  We also hopefully will be able to get her to see psychiatry soon as above.  Poor compliance with medication Still having significant issues with compliance as patient is adjusting medications at home without input and is also taking leftover medications.  Advised her to discard any medications that she is not currently prescribed.  Unfortunately it sounds like  her home health nurse had called in Lasix 40 mg daily without a order from [name]. Patient has been on this for the last couple of days or so.  We discontinued this several months ago due to concerns for hypokalemia.  Will recheck c-Met today.  She will need to stay off of this.  We will reach out to home health company to clarify what happened with this order as we did not approve or authorize this.   For her ongoing issues with medication compliance we did refer her to care management and she has also been seeing home health as above.  We gave contact information for care management and she will call to schedule appoint with them soon.  She will follow-up with me in a couple of weeks and bring all medications to that visit.     Subjective:  HPI:  See A/P for status of chronic conditions.  Patient is here today for multiple issues.  We saw her about 3 weeks ago.  At that time was doing relatively well and we did not make any adjustments to her treatment plan at that time.  Over the last couple of weeks she has noticed increasing anxiety.  She has been trying to adjust medications at home however has not had much improvement.  This has been a longstanding issue and we have had repeated issues with difficult compliance with medications.  Apparently she stopped taking her Lexapro a while ago because she did not feel like it was effective.  She doubled up on her Remeron for several weeks and then ran out.  She did not feel like this was effective either.  She is currently on BuSpar 5 mg twice daily and hydroxyzine as needed.  She does not feel like this is effective either.  We have tried to refer her to see psychiatry multiple times however this has not yet been set up.  We did refer her to care management to help with medication compliance and coordination of care however she has not yet been able to establish with them either.  Denies any SI or HI.  She is not having any side effects with her current  medication regimen.  She also apparently doubled up on her dose of amlodipine and is now taking 5 mg daily.  She is doing well with this.  She did notice more edema in her legs and a prescription for Lasix 40 mg daily was sent into her pharmacy.  This was not sent in by our office.  Patient states that the home health nurse called this in for her.  She has been on this for the last day or so.  She has had improvement of the lower extremity swelling while on this.       Objective:  Physical Exam: BP 118/70 (BP Location: Left Arm, Patient Position: Sitting, Cuff Size: Large)   Pulse 78   Temp 97.7 F (36.5 C) (Temporal)   Ht 5\' 2"  (1.575 m)   Wt 160 lb 8 oz (72.8 kg)   SpO2 95%   BMI 29.36 kg/m   Gen: No acute distress, resting comfortably CV: Regular rate and rhythm with no murmurs appreciated Pulm: Normal work of breathing, clear to auscultation bilaterally with no crackles, wheezes, or rhonchi Neuro: Grossly normal, moves all extremities Psych: Normal affect and thought content  Time Spent: 40 minutes of total time was spent on the date of the encounter performing the following actions: chart review prior to seeing the patient, obtaining history, performing a medically necessary exam, counseling on the treatment plan, placing orders, and documenting in our EHR.       [name]. [name], MD 07/16/2022 11:36 AM

## 2022-07-16 NOTE — Assessment & Plan Note (Signed)
See anxiety Assessment / plan. We are adjusting dose of medications as above.  We have had significant issues with medication compliance.  Will increase the dose of BuSpar to 10 mg twice daily.  Will take Lexapro off of her medication list as she has been off of this.  She will come back in a couple of weeks.  Advised her to bring all medications to her follow-up visit.  We also hopefully will be able to get her to see psychiatry soon as above.

## 2022-07-16 NOTE — Assessment & Plan Note (Signed)
Blood pressure at goal today.  Apparently she has been taking amlodipine 5 mg daily.  Given that her pressure is well-controlled we will continue this dose for now.  Will update her medication list to reflect this change.  She will monitor at home and let us know if persistently elevated.  We can recheck again in a couple of weeks.

## 2022-07-16 NOTE — Assessment & Plan Note (Signed)
Had a lengthy discussion with patient regarding medications.  We did discuss importance of medication compliance so that we can make sure that we are not duplicating therapy and not causing any issues with possible interactions.  She is no longer on Lexapro and she did not feel like it was effective-we will take this off her medication list.  She has been taking hydroxyzine 10-20 mg 3 times daily.  Will increase her BuSpar to 10 mg daily.  We had referred to psychiatry however they were unable to get in contact with her.  Will give contact information for her to call and schedule appointment.

## 2022-07-16 NOTE — Assessment & Plan Note (Addendum)
Still having significant issues with compliance as patient is adjusting medications at home without input and is also taking leftover medications.  Advised her to discard any medications that she is not currently prescribed.  Unfortunately it sounds like her home health nurse had called in Lasix 40 mg daily without a order from [name]. Patient has been on this for the last couple of days or so.  We discontinued this several months ago due to concerns for hypokalemia.  Will recheck c-Met today.  She will need to stay off of this.  We will reach out to home health company to clarify what happened with this order as we did not approve or authorize this.   For her ongoing issues with medication compliance we did refer her to care management and she has also been seeing home health as above.  We gave contact information for care management and she will call to schedule appoint with them soon.  She will follow-up with me in a couple of weeks and bring all medications to that visit.

## 2022-07-18 ENCOUNTER — Other Ambulatory Visit: Payer: Self-pay | Admitting: Family Medicine

## 2022-07-18 DIAGNOSIS — F411 Generalized anxiety disorder: Secondary | ICD-10-CM

## 2022-07-21 ENCOUNTER — Telehealth: Payer: Self-pay | Admitting: Family Medicine

## 2022-07-21 DIAGNOSIS — M170 Bilateral primary osteoarthritis of knee: Secondary | ICD-10-CM

## 2022-07-21 NOTE — Progress Notes (Signed)
   Care Guide Note  07/21/2022 Name: Casey [name] MRN: [id number] DOB: Sep 16, 1935  Referred by: Ardith Dark, MD Reason for referral : Chronic Care Management (Outreach to schedule referral )   Casey [name] is a 87 y.o. year old female who is a primary care patient of Ardith Dark, MD. Casey [name] was referred to the pharmacist for assistance related to DM.    A second unsuccessful telephone outreach was attempted today to contact the patient who was referred to the pharmacy team for assistance with medication management. Additional attempts will be made to contact the patient.  Penne Lash, RMA Care Guide [facility]  [place], [place] [address] Direct Dial: [phone] [email or web address]

## 2022-07-21 NOTE — Telephone Encounter (Signed)
Prescription Request  07/21/2022  LOV: 07/16/2022  What is the name of the medication or equipment? HYDROcodone-acetaminophen (NORCO/VICODIN) 5-325 MG tablet   Have you contacted your pharmacy to request a refill? Yes   Which pharmacy would you like this sent to?  [id number] - [place], [place] - [address] [place] [address] Phone: [phone] Fax: [phone]   Patient notified that their request is being sent to the clinical staff for review and that they should receive a response within 2 business days.   Please advise at Mobile [phone] (mobile)

## 2022-07-24 ENCOUNTER — Other Ambulatory Visit: Payer: Self-pay | Admitting: Family Medicine

## 2022-07-24 DIAGNOSIS — N1831 Chronic kidney disease, stage 3a: Secondary | ICD-10-CM

## 2022-07-27 MED ORDER — HYDROCODONE-ACETAMINOPHEN 5-325 MG PO TABS
1.0000 | ORAL_TABLET | Freq: Three times a day (TID) | ORAL | 0 refills | Status: DC | PRN
Start: 2022-07-27 — End: 2022-09-04

## 2022-07-28 ENCOUNTER — Ambulatory Visit: Payer: 59 | Admitting: Family Medicine

## 2022-07-29 NOTE — Progress Notes (Signed)
  Chronic Care Management   Note  07/29/2022 Name: Casey [name] MRN: [id number] DOB: [birth year]  [name] is a 87 y.o. year old female who is a primary care patient of [name], [name], MD. I reached out to [name] by phone today in response to a referral sent by Ms. Rexene Alberts Cerney's PCP.  [name] was given information about Chronic Care Management services today including:  CCM service includes personalized support from designated clinical staff supervised by the physician, including individualized plan of care and coordination with other care providers 24/7 contact phone numbers for assistance for urgent and routine care needs. Service will only be billed when office clinical staff spend 20 minutes or more in a month to coordinate care. Only one practitioner may furnish and bill the service in a calendar month. The patient may stop CCM services at amy time (effective at the end of the month) by phone call to the office staff. The patient will be responsible for cost sharing (co-pay) or up to 20% of the service fee (after annual deductible is met)  [name]  agreedto scheduling an appointment with the CCM RN Case Manager   Follow up plan: Patient agreed to scheduled appointment with RN Case Manager on 08/13/2022 Pharm d 08/06/2022(date/time).   Penne Lash, RMA Care Guide [facility]  [place], [place] [address] Direct Dial: [phone] [email or web address]

## 2022-08-04 ENCOUNTER — Ambulatory Visit: Payer: 59 | Admitting: Family Medicine

## 2022-08-04 ENCOUNTER — Encounter: Payer: Self-pay | Admitting: Family Medicine

## 2022-08-04 VITALS — BP 133/67 | HR 63 | Temp 97.5°F | Ht 62.0 in | Wt 158.8 lb

## 2022-08-04 DIAGNOSIS — F411 Generalized anxiety disorder: Secondary | ICD-10-CM | POA: Diagnosis not present

## 2022-08-04 DIAGNOSIS — R809 Proteinuria, unspecified: Secondary | ICD-10-CM

## 2022-08-04 DIAGNOSIS — Z91148 Patient's other noncompliance with medication regimen for other reason: Secondary | ICD-10-CM

## 2022-08-04 DIAGNOSIS — E1159 Type 2 diabetes mellitus with other circulatory complications: Secondary | ICD-10-CM | POA: Diagnosis not present

## 2022-08-04 DIAGNOSIS — I152 Hypertension secondary to endocrine disorders: Secondary | ICD-10-CM

## 2022-08-04 DIAGNOSIS — E1129 Type 2 diabetes mellitus with other diabetic kidney complication: Secondary | ICD-10-CM

## 2022-08-04 DIAGNOSIS — R600 Localized edema: Secondary | ICD-10-CM

## 2022-08-04 DIAGNOSIS — F0390 Unspecified dementia without behavioral disturbance: Secondary | ICD-10-CM | POA: Diagnosis not present

## 2022-08-04 LAB — COMPREHENSIVE METABOLIC PANEL
ALT: 12 U/L (ref 0–35)
AST: 17 U/L (ref 0–37)
Albumin: 4.4 g/dL (ref 3.5–5.2)
Alkaline Phosphatase: 97 U/L (ref 39–117)
BUN: 12 mg/dL (ref 6–23)
CO2: 28 mEq/L (ref 19–32)
Calcium: 10.1 mg/dL (ref 8.4–10.5)
Chloride: 105 mEq/L (ref 96–112)
Creatinine, Ser: 0.92 mg/dL (ref 0.40–1.20)
GFR: 56.79 mL/min — ABNORMAL LOW (ref >60.00)
Glucose, Bld: 191 mg/dL — ABNORMAL HIGH (ref 70–99)
Potassium: 3.6 mEq/L (ref 3.5–5.1)
Sodium: 141 mEq/L (ref 135–145)
Total Bilirubin: 0.5 mg/dL (ref 0.2–1.2)
Total Protein: 7.7 g/dL (ref 6.0–8.3)

## 2022-08-04 LAB — CBC
HCT: 39 % (ref 36.0–46.0)
Hemoglobin: 12.5 g/dL (ref 12.0–15.0)
MCHC: 32 g/dL (ref 30.0–36.0)
MCV: 88.3 fl (ref 78.0–100.0)
Platelets: 300 10*3/uL (ref 150.0–400.0)
RBC: 4.41 Mil/uL (ref 3.87–5.11)
RDW: 16.4 % — ABNORMAL HIGH (ref 11.5–15.5)
WBC: 8.2 10*3/uL (ref 4.0–10.5)

## 2022-08-04 MED ORDER — BUSPIRONE HCL 10 MG PO TABS: 20.0000 mg | ORAL_TABLET | Freq: Two times a day (BID) | ORAL | 3 refills | Status: DC

## 2022-08-04 MED ORDER — POTASSIUM CHLORIDE CRYS ER 20 MEQ PO TBCR
20.0000 meq | EXTENDED_RELEASE_TABLET | Freq: Every day | ORAL | 5 refills | Status: DC
Start: 2022-08-04 — End: 2023-02-23

## 2022-08-04 NOTE — Patient Instructions (Addendum)
It was very nice to see you today!  Please start the potassium supplementation if you are going to stay on the Lasix.  We will increase your BuSpar to 20 mg twice daily.  You can double up on your current prescription.  We will check blood work today.  I will work on getting you more assistance at home as well.  Return in about 1 month (around 09/04/2022).   Take care, Dr [name]  PLEASE NOTE:  If you had any lab tests, please let us know if you have not heard back within a few days. You may see your results on mychart before we have a chance to review them but we will give you a call once they are reviewed by [name].   If we ordered any referrals today, please let us know if you have not heard from their office within the next week.   If you had any urgent prescriptions sent in today, please check with the pharmacy within an hour of our visit to make sure the prescription was transmitted appropriately.   Please try these tips to maintain a healthy lifestyle:  Eat at least 3 REAL meals and 1-2 snacks per day.  Aim for no more than 5 hours between eating.  If you eat breakfast, please do so within one hour of getting up.   Each meal should contain half fruits/vegetables, one quarter protein, and one quarter carbs (no bigger than a computer mouse)  Cut down on sweet beverages. This includes juice, soda, and sweet tea.   Drink at least 1 glass of water with each meal and aim for at least 8 glasses per day  Exercise at least 150 minutes every week.

## 2022-08-04 NOTE — Assessment & Plan Note (Signed)
Blood pressure at goal on amlodipine 5 mg daily. 

## 2022-08-04 NOTE — Assessment & Plan Note (Signed)
To early to recheck A1c.  Continue Jardiance 10 mg daily.  Check glucose on c-Met.

## 2022-08-04 NOTE — Progress Notes (Signed)
Casey [name] is a 87 y.o. female who presents today for an office visit.  Assessment/Plan:  Chronic Problems Addressed Today: Poor compliance with medication Had lengthy discussion with patient today regarding her medication adherence.  She is adjusting medications at home and taking old prescriptions at home as she feels like.  She did bring her medications with her today however it is not clear to me what medications that she still has stash at home.  We did refer her to home health a couple of months ago however she was discharged from their services.  We referred for chronic care management however she has not heard anything back from them yet.  Hopefully she will be able to see a case Production designer, theatre/television/film and pharmacist soon.  Will refer to [facility] to see if they have any additional resources to help her with this.  She is at high risk for hospitalization due to her comorbidities.  GAD (generalized anxiety disorder) Patient has still been taking her BuSpar 10 mg twice daily and hydroxyzine 3 times daily as needed however does feel like her anxiety is still not fully controlled.  She has been on SSRIs in the past but not for like they are effective.  She is also on Seroquel the past but does not remember how this worked for her.  She is on gabapentin 100 mg 3 times daily.  We we will increase her BuSpar to 20 mg twice daily.  Will also place another referral for her to see psychiatry.  We did this a few months ago however they could not reach patient to schedule and when she called to schedule appointment she was told that she needed another referral.  Hopefully she will be to see psychiatry soon however she will come back to see me in a month and we can adjust meds as needed.  Hypertension associated with diabetes (HCC) Blood pressure at goal on amlodipine 5 mg daily.  Dementia without behavioral disturbance (HCC) This is contributing to her issues with medication adherence.  She feels safe at home  and is living by herself.  Will refer him to [facility] as above which should hopefully help with some extra at home resources.  She has been discharged from home health due to meeting goals several weeks ago.  She apparently restarted her donepezil since her last visit.  Will react this to her medication list.  Advised her to follow-up with neurology soon.  Type 2 diabetes mellitus with microalbuminuria, without long-term current use of insulin (HCC) To early to recheck A1c.  Continue Jardiance 10 mg daily.  Check glucose on c-Met.  Leg edema Symptoms are now stable.  She would like to stay on Lasix that she feels like this is helping with her leg edema.  Due to her previous issues with hypokalemia we will start potassium supplementation 20 mill equivalents daily.  We are checking labs today.  Will check again in a month.     Subjective:  HPI:  See A/P for status of chronic conditions.  Patient is here for follow-up.  She was last seen 3 weeks ago.  At that time we had extensive discussion regarding her medication and medication list and we have been working hard on medication compliance.  At her last visit she was continued on amlodipine 5 mg daily for her hypertension.  For her anxiety we took the Lexapro off of her medication list and increased her BuSpar to 10 mg twice daily.  Also continue hydroxyzine 10  to 20 mg 3 times daily as needed.   Also at her last visit the home health nurse had called in Lasix 40 mg daily which we had previously discontinued due to concerns for hypokalemia.  We had asked her to stop this last time.   Since our last visit she tells me that she has been consistent with her medications. We reviewed the medications that she brought in today and she is still taking the lasix 40 mg daily. She has also been taking donapezil 10 mg at night despite telling us that she discontinued this several months ago.   She is overall feeling "ok" today. She does feel like her anxiety is  not well controlled. She has been on buspar and hydroxyzine but does not feel like they are strong enough. She tried to call to schedule an appointment with psychiatry but was told she needed another referral.       Objective:  Physical Exam: BP 133/67   Pulse 63   Temp (!) 97.5 F (36.4 C) (Temporal)   Ht 5\' 2"  (1.575 m)   Wt 158 lb 12.8 oz (72 kg)   SpO2 100%   BMI 29.04 kg/m   Gen: No acute distress, resting comfortably Neuro: Grossly normal, moves all extremities Psych: Normal affect and thought content  Time Spent: 45 minutes of total time was spent on the date of the encounter performing the following actions: chart review prior to seeing the patient, obtaining history, performing a medically necessary exam, counseling on the treatment plan including medication changes and referrals, placing orders, and documenting in our EHR.        [name]. [name], MD 08/04/2022 10:15 AM

## 2022-08-04 NOTE — Assessment & Plan Note (Signed)
Had lengthy discussion with patient today regarding her medication adherence.  She is adjusting medications at home and taking old prescriptions at home as she feels like.  She did bring her medications with her today however it is not clear to me what medications that she still has stash at home.  We did refer her to home health a couple of months ago however she was discharged from their services.  We referred for chronic care management however she has not heard anything back from them yet.  Hopefully she will be able to see a case Production designer, theatre/television/film and pharmacist soon.  Will refer to [facility] to see if they have any additional resources to help her with this.  She is at high risk for hospitalization due to her comorbidities.

## 2022-08-04 NOTE — Assessment & Plan Note (Signed)
This is contributing to her issues with medication adherence.  She feels safe at home and is living by herself.  Will refer him to [facility] as above which should hopefully help with some extra at home resources.  She has been discharged from home health due to meeting goals several weeks ago.  She apparently restarted her donepezil since her last visit.  Will react this to her medication list.  Advised her to follow-up with neurology soon.

## 2022-08-04 NOTE — Assessment & Plan Note (Signed)
Symptoms are now stable.  She would like to stay on Lasix that she feels like this is helping with her leg edema.  Due to her previous issues with hypokalemia we will start potassium supplementation 20 mill equivalents daily.  We are checking labs today.  Will check again in a month.

## 2022-08-04 NOTE — Assessment & Plan Note (Signed)
Patient has still been taking her BuSpar 10 mg twice daily and hydroxyzine 3 times daily as needed however does feel like her anxiety is still not fully controlled.  She has been on SSRIs in the past but not for like they are effective.  She is also on Seroquel the past but does not remember how this worked for her.  She is on gabapentin 100 mg 3 times daily.  We we will increase her BuSpar to 20 mg twice daily.  Will also place another referral for her to see psychiatry.  We did this a few months ago however they could not reach patient to schedule and when she called to schedule appointment she was told that she needed another referral.  Hopefully she will be to see psychiatry soon however she will come back to see me in a month and we can adjust meds as needed.

## 2022-08-04 NOTE — Progress Notes (Signed)
Great news! Labs are all stable. We can recheck in a month.  [name]. [name], MD 08/04/2022 12:16 PM

## 2022-08-05 ENCOUNTER — Encounter: Payer: Self-pay | Admitting: Podiatry

## 2022-08-05 ENCOUNTER — Ambulatory Visit (INDEPENDENT_AMBULATORY_CARE_PROVIDER_SITE_OTHER): Payer: 59 | Admitting: Podiatry

## 2022-08-05 VITALS — BP 163/74

## 2022-08-05 DIAGNOSIS — M79675 Pain in left toe(s): Secondary | ICD-10-CM | POA: Diagnosis not present

## 2022-08-05 DIAGNOSIS — E1142 Type 2 diabetes mellitus with diabetic polyneuropathy: Secondary | ICD-10-CM | POA: Diagnosis not present

## 2022-08-05 DIAGNOSIS — B351 Tinea unguium: Secondary | ICD-10-CM | POA: Diagnosis not present

## 2022-08-05 DIAGNOSIS — M79674 Pain in right toe(s): Secondary | ICD-10-CM

## 2022-08-06 ENCOUNTER — Other Ambulatory Visit: Payer: 59 | Admitting: Pharmacist

## 2022-08-06 NOTE — Progress Notes (Signed)
08/06/2022 Name: Casey [name] MRN: [id number] DOB: 06-10-1935  Chief Complaint  Patient presents with   Diabetes   Hypertension   Medication Management    Casey [name] is a 87 y.o. year old female who presented for a telephone visit.   They were referred to the pharmacist by their PCP for assistance in managing diabetes, hypertension, hyperlipidemia, and complex medication management.    Subjective:  Care Team: Primary Care Provider: Ardith Dark, MD ; Next Scheduled Visit: 09/03/2022 Has been referred to psychiatry - referral pending  Medication Access/Adherence Patient has been noted recently to have been starting and stopping medications on her own.  Reviwed med list with her today.   She reports taking 2 medications that are not on her medication list - pantoprazole and atorvastatin Pantoprazole was removed from med list 08/04/2022 by Dr [name].  Patient was not really sure what pantoprazole was for but when I told her it was for reflux, she states that she needs to continue pantoprazole because it helps with reflux.   Patient reports affordability concerns with their medications: No  Patient reports access/transportation concerns to their pharmacy: No  Patient reports adherence concerns with their medications:  No    Barriers to Adherence:  Multiple comorbidities Complex medication regimen Poor knowledge of the illness and medication. Independent pausing, stopping or controlling of the medication. Low understanding of medications benefits.    Diabetes:  Current medications:  Jardiance  Medications tried in the past: metformin - stopped 2023 due to improved blood glucose per patient but due to recent AKI with hospitalization, would avoid metformin.  Current glucose readings:  123, 118, 140 (highest) Denies blood glucose < 80 or > 200 Using Accu-Chek Guide meter; testing 3 times daily  Patient denies hypoglycemic s/sx including no dizziness, shakiness,  sweating. Patient denies hyperglycemic symptoms including no polyuria, polydipsia, polyphagia, nocturia, neuropathy, blurred vision.  Current meal patterns:  Doesn't cook much- [employer] (vegetable plates) - Breakfast: oatmeal; [place] bacon + eggs; pancakes - Lunch: salad - Supper: fried or baked chicken, hamburgers, fish, pinto beans, pickled beets, green beans, broccoli - Snacks: doesn't usually snack - Drinks: Sprite Zero,  rarely but does drink regular [employer] - "when I need energy", coffee  Avoids ham due to blood pressure  Current physical activity: walks when it is cool - 30 minutes    Hypertension: Current medications: amlodipine 2.5mg  - take 2 tablets daily  Medications previously tried: lisinopril stopped due to AKI  Patient does not have a validated, automated, upper arm home BP cuff Current blood pressure readings readings: none   BP Readings from Last 3 Encounters:  08/05/22 (!) 163/74  08/04/22 133/67  07/16/22 118/70    Patient reports hypotensive s/sx including: occasional dizziness which she states is related to allergies / vertigo.  Patient denies hypertensive symptoms including no headaches (occasional sinus headaches)  chest pain, shortness of breath   Hyperlipidemia/ASCVD Risk Reduction  Current lipid lowering medications: atorvastatin 10mg  daily - not on her med list but patient reports taking atoravastatin   Antiplatelet regimen: aspirin 81mg  daily    Dementia / Anxiety:  Current medications: donepezil 10mg  daily at bedtime Buspirone 10mg  - recent increase to take 2 tabs = 20mg  twice a day Hydroxyzine 10mg  - take 10 to 20mg  up to 3 times a day as needed.   Medications tried in the past: escitalopram - stopped 03/2022, mirtazapine 7.5mg  - stopped 03/2022, trazodone - took 2021 thru 05/29/2021, Seroquel - various  doses - stopped in 2024  It has been difficult over the last few months to know exactly which medications have helped  Mrs. Millstein regarding anxiety. She has started and stopped several on her own.   Today she reports that she stopped escitalopram because she thought it was for blood pressure and that she didn't need it. She states she thinks anxiety was better when she was taking escitalopram.   Behavioral Health support: none current - psychiatry referral pending   Objective:  Lab Results  Component Value Date   HGBA1C 8.0 (H) 05/31/2022    Lab Results  Component Value Date   CREATININE 0.92 08/04/2022   BUN 12 08/04/2022   NA 141 08/04/2022   K 3.6 08/04/2022   CL 105 08/04/2022   CO2 28 08/04/2022    Lab Results  Component Value Date   CHOL 172 05/31/2022   HDL 44 05/31/2022   LDLCALC 111 (H) 05/31/2022   TRIG 83 05/31/2022   CHOLHDL 3.9 05/31/2022    Medications Reviewed Today     Reviewed by Freddie Breech, DPM (Physician) on 08/05/22 at 1538  Med List Status: <None>   Medication Order Taking? Sig Documenting Provider Last Dose Status Informant  amLODipine (NORVASC) 2.5 MG tablet [id number] Yes Take 2 tablets (5 mg total) by mouth daily. Ardith Dark, MD Taking Active   aspirin 81 MG chewable tablet [id number] Yes Chew 1 tablet (81 mg total) by mouth 2 (two) times daily. [place], Alvira Philips, DO Taking Active Self  azelastine (ASTELIN) 0.1 % nasal spray [id number] Yes INSTILL 1 SPRAY INTO BOTH NOSTRILS TWICE DAILY  Patient taking differently: Place 1 spray into both nostrils 2 (two) times daily.   [name], NP Taking Active Self  azelastine (OPTIVAR) 0.05 % ophthalmic solution [id number] Yes Place 1 drop into both eyes 2 (two) times daily. [provider] Taking Active Self  Blood Glucose Monitoring Suppl DEVI [id number] Yes 1 each by Does not apply route in the morning, at noon, and at bedtime. May substitute to any manufacturer covered by patient's insurance.Dx E11.29, Ardith Dark, MD Taking Active   busPIRone (BUSPAR) 10 MG tablet [id number] Yes Take 2 tablets  (20 mg total) by mouth 2 (two) times daily. Ardith Dark, MD Taking Active   Carboxymethylcellul-Glycerin [facility] OPTIVE) 1-0.9 % GEL [id number] Yes Place 1 drop into both eyes daily. [provider] Taking Active Self  donepezil (ARICEPT) 10 MG tablet [id number] Yes Take 10 mg by mouth at bedtime. [provider] Taking Active   furosemide (LASIX) 40 MG tablet [id number] Yes Take 40 mg by mouth. [provider] Taking Active   gabapentin (NEURONTIN) 100 MG capsule [id number] Yes Take 1 capsule (100 mg total) by mouth 3 (three) times daily. Ardith Dark, MD Taking Active   HYDROcodone-acetaminophen (NORCO/VICODIN) 5-325 MG tablet [id number] Yes Take 1 tablet by mouth every 8 (eight) hours as needed for moderate pain. For chronic pain Ardith Dark, MD Taking Active   hydrOXYzine (ATARAX) 10 MG tablet [id number] Yes TAKE 1 TO 2 TABLETS(10 TO 20 MG) BY MOUTH THREE TIMES DAILY AS NEEDED Ardith Dark, MD Taking Active   JARDIANCE 10 MG TABS tablet [id number] Yes TAKE 1 TABLET(10 MG) BY MOUTH DAILY BEFORE BREAKFAST Ardith Dark, MD Taking Active   meclizine (ANTIVERT) 25 MG tablet [id number] Yes Take 1 tablet (25 mg total) by mouth 3 (three) times daily as needed for dizziness. Ardith Dark, MD Taking Active Self  Multiple Vitamins-Minerals (CERTAVITE/ANTIOXIDANTS) TABS [id number] Yes Take 1 tablet by mouth at bedtime. Drema Dallas, MD Taking Active   polyethylene glycol powder [facility]) 17 GM/SCOOP powder [id number] Yes Take 17 g by mouth daily. Ardith Dark, MD Taking Active   potassium chloride SA (KLOR-CON M) 20 MEQ tablet [id number] Yes Take 1 tablet (20 mEq total) by mouth daily. Ardith Dark, MD Taking Active   senna-docusate (SENOKOT-S) 8.6-50 MG tablet [id number] Yes Take 1 tablet by mouth at bedtime as needed for mild constipation. Drema Dallas, MD Taking Active               Assessment/Plan:   Diabetes: Currently home blood glucose  has improved but last A1c was not at goal of < 7.0% - Reviewed goal A1c, goal fasting, and goal 2 hour post prandial glucose - Reviewed dietary modifications including increasing non starchy vegetable and limiting in take of fried foods. Also recommended stopping all sugar containing beverages.  - Reviewed lifestyle modifications including:increase frequency of walking to 5 days per week - goal is to get 150 minutes of exercise per week.  - Recommend to continue Jardiance 10mg  daily (if A1c still elevated at next check could consider increasing Jardiance). Would use metformin with caution due to history of AKI this year. Also has had poor p.o intake in last year so GLP1s might not be best next choice.  - Recommend to check glucose 2 to 3 times a day   Hypertension:Currently controlled - blood pressure was high yesterday at podiatrist but previous 2 office BPs good - Reviewed long term cardiovascular and renal outcomes of uncontrolled blood pressure - Recommended she purchase automated blood pressure cuff. She has monthly over-the-counter benefits with her duel enrollment [facility] plan. Recommended to check home blood pressure and heart rate 2 to 3 times per week and record.  - Recommend to continue amlodipine 2.5mg  - take 2 tablets daily (plan to change to 5mg  daily in future if continues to tolerate   Hyperlipidemia/ASCVD Risk Reduction: Currently uncontrolled. - last LDL was not at goal of < 70 - Reviewed long term complications of uncontrolled cholesterol - Will check to PCP to see if OK to  continue atorvastatin 10mg  daily since it was not on her med list (she last refill #90 06/24/202  Depression/Anxiety:Currently uncontrolled - Continue buspirone 20mg  twice a day and hydroxyzine 10 to 20mg  up to 3 times a day if needed.  - Will check with PCP about possibly retrial of escitalopram for anxiety since patient feels was helpful in past. Dr [name] might prefer to wait until she see  psychiatrist.  - It looks like she might need an updated referral to psychiatry    Medication Management: Currently strategy insufficient to maintain appropriate adherence to prescribed medication regimen - Discuss importance of taking medications as prescribe.  - Suggested use of weekly pill box to organize medications. Patient is also considering adherence packaging.  - Plan to created list of medication, indication, and administration time and provide to patient once I hear from Dr [name] about the following medications - atorvastatin, escitalopram and pantoprazole.  - Will try to meet patient in office in August for review of medications and education.   Follow Up Plan: 1 month  Henrene Pastor, PharmD Clinical Pharmacist [facility]  Population Health

## 2022-08-10 NOTE — Progress Notes (Signed)
  Subjective:  Patient ID: Casey [name], female    DOB: [birth year],  MRN: [id number]  Casey [name] presents to clinic today for at risk foot care with history of diabetic neuropathy  Chief Complaint  Patient presents with   Nail Problem    DFC,Referring Provider Ardith Dark, MD,lov:07/24,A1C:8.0      New problem(s): None.   PCP is Ardith Dark, MD.  No Known Allergies  Review of Systems: Negative except as noted in the HPI.  Objective: No changes noted in today's physical examination. Vitals:   08/05/22 1527  BP: (!) 163/74   Casey [name] is a pleasant 87 y.o. female WD, WN in NAD. AAO x 3.  Vascular Examination: CFT <3 seconds b/l. DP pulses faintly palpable b/l. PT pulses nonpalpable b/l. Digital hair absent. Skin temperature gradient warm to warm b/l. No pain with calf compression. No ischemia or gangrene. No cyanosis or clubbing noted b/l. Nonpitting edema noted BLE.   Neurological Examination: Sensation grossly intact b/l with 10 gram monofilament. Pt has subjective symptoms of neuropathy.  Dermatological Examination: Pedal skin warm and supple b/l. No open wounds b/l. No interdigital macerations. Toenails 1-5 b/l thick, discolored, elongated with subungual debris and pain on dorsal palpation.  No corns, calluses nor porokeratotic lesions noted.  Musculoskeletal Examination: Muscle strength 5/5 to all lower extremity muscle groups bilaterally. Pes planus deformity noted bilateral LE. Utilizes cane for ambulation assistance.  Radiographs: None  Last HgA1c:      Latest Ref Rng & Units 05/31/2022    4:35 AM 12/26/2021    9:42 AM 08/19/2021   11:45 AM  Hemoglobin A1C  Hemoglobin-A1c 4.8 - 5.6 % 8.0  7.3  7.2    Assessment/Plan: 1. Pain due to onychomycosis of toenails of both feet   2. Diabetic peripheral neuropathy associated with type 2 diabetes mellitus (HCC)     -Patient was evaluated and treated. All patient's and/or POA's questions/concerns  answered on today's visit. -No new findings. No new orders. -Toenails 1-5 b/l were debrided in length and girth with sterile nail nippers and dremel without iatrogenic bleeding.  -Patient/POA to call should there be question/concern in the interim.   Return in about 3 months (around 11/05/2022).  Freddie Breech, DPM

## 2022-08-13 ENCOUNTER — Ambulatory Visit (INDEPENDENT_AMBULATORY_CARE_PROVIDER_SITE_OTHER): Payer: 59 | Admitting: *Deleted

## 2022-08-13 ENCOUNTER — Telehealth: Payer: Self-pay | Admitting: Pharmacist

## 2022-08-13 DIAGNOSIS — E1159 Type 2 diabetes mellitus with other circulatory complications: Secondary | ICD-10-CM

## 2022-08-13 DIAGNOSIS — E1129 Type 2 diabetes mellitus with other diabetic kidney complication: Secondary | ICD-10-CM

## 2022-08-13 MED ORDER — ASPIRIN 81 MG PO CHEW: 81.0000 mg | CHEWABLE_TABLET | Freq: Every day | ORAL | Status: DC

## 2022-08-13 NOTE — Chronic Care Management (AMB) (Signed)
Chronic Care Management   CCM RN Visit Note  08/13/2022 Name: Casey [name] MRN: [id number] DOB: [birth year]  Subjective: Casey [name] is a 87 y.o. year old female who is a primary care patient of Ardith Dark, MD. The patient was referred to the Chronic Care Management team for assistance with care management needs subsequent to provider initiation of CCM services and plan of care.    Today's Visit:  Engaged with patient by telephone for initial visit.     SDOH Interventions Today    Flowsheet Row Most Recent Value  SDOH Interventions   Food Insecurity Interventions Intervention Not Indicated  Housing Interventions Intervention Not Indicated  Transportation Interventions Intervention Not Indicated  Utilities Interventions Intervention Not Indicated  Financial Strain Interventions Intervention Not Indicated  Physical Activity Interventions Patient Declined  Stress Interventions Other (Comment)  [pt is on medication, states psychiatry referral is in the works, states over the phone with social worker will not work well for her, she wants to see psychiatrist in person]  Health Literacy Interventions Intervention Not Indicated         Goals Addressed             This Visit's Progress    CCM (DIABETES) EXPECTED OUTCOME: MONITOR, SELF-MANAGE AND REDUCE SYMPTOMS OF DIABETES       Current Barriers:  Knowledge Deficits related to Diabetes management Chronic Disease Management support and education needs related to Diabetes, diet Cognitive Deficits No Advanced Directives in place- pt requests documents be mailed Patient reports she lives alone, is independent with all aspects of her care, does not drive, brother and first cousin provide transportation Patient reports she checks CBG TID with fasting readings in low 100's with today's reading 123, random readings low 100's Patient reports she has chronic anxiety, nervousness, no depression, is on medication for anxiety, states  referral for psychiatrist " is in the works but I haven't heard anything yet"  pt feels seeing psychiatrist in person will help her the most, does not feel telephonic management by social worker would be as effective for her  Pharmacist is currently working with pt for medication adherence, pt reports today she has all medications and taking as prescribed  Planned Interventions: Provided education to patient about basic DM disease process; Reviewed prescribed diet with patient carbohydrate modified; Counseled on importance of regular laboratory monitoring as prescribed;        Discussed plans with patient for ongoing care management follow up and provided patient with direct contact information for care management team;      Provided patient with written educational materials related to hypo and hyperglycemia and importance of correct treatment;       Advised patient, providing education and rationale, to check cbg three times daily and record        call provider for findings outside established parameters;       Review of patient status, including review of consultants reports, relevant laboratory and other test results, and medications completed;       Screening for signs and symptoms of depression related to chronic disease state;        Assessed social determinant of health barriers;        Advanced directives mailed In basket message sent to Dr. [name] with update and clarification on where psychiatry referral was sent  Symptom Management: Take medications as prescribed   Attend all scheduled provider appointments Call pharmacy for medication refills 3-7 days in advance of running out of  medications Attend church or other social activities Perform all self care activities independently  Call provider office for new concerns or questions  check blood sugar at prescribed times: three times daily check feet daily for cuts, sores or redness enter blood sugar readings and medication or  insulin into daily log take the blood sugar log to all doctor visits take the blood sugar meter to all doctor visits trim toenails straight across fill half of plate with vegetables limit fast food meals to no more than 1 per week manage portion size read food labels for fat, fiber, carbohydrates and portion size set a realistic goal Advanced directives mailed- please look over and complete Education mailed- hypoglycemia Psychiatry referral is being checked on, hopefully you will hear something soon  Follow Up Plan: Telephone follow up appointment with care management team member scheduled for:  09/22/22 at 130 pm       CCM (HYPERTENSION) EXPECTED OUTCOME: SELF-MANAGE AND REDUCE SYMPTOMS OF HYPERTENSION       Current Barriers:  Knowledge Deficits related to Hypertension management Care Coordination needs related to medication management in a patient with Hypertension Chronic Disease Management support and education needs related to Hypertension, diet Patient reports she has blood pressure cuff but does not monitor blood pressure, pt exercises some throughout the week, likes to walk  Planned Interventions: Evaluation of current treatment plan related to hypertension self management and patient's adherence to plan as established by provider;   Reviewed prescribed diet low sodium Reviewed medications with patient and discussed importance of compliance;  Counseled on the importance of exercise goals with target of 150 minutes per week Discussed plans with patient for ongoing care management follow up and provided patient with direct contact information for care management team; Advised patient, providing education and rationale, to monitor blood pressure daily and record, calling PCP for findings outside established parameters;  Provided education on prescribed diet low sodium;  Discussed complications of poorly controlled blood pressure such as heart disease, stroke, circulatory  complications, vision complications, kidney impairment, sexual dysfunction;  Screening for signs and symptoms of depression related to chronic disease state;  Assessed social determinant of health barriers;   Symptom Management: Take medications as prescribed   Attend all scheduled provider appointments Call pharmacy for medication refills 3-7 days in advance of running out of medications Attend church or other social activities Perform all self care activities independently  Call provider office for new concerns or questions  call the Suicide and Crisis Lifeline: 988 call the [place] National Suicide Prevention Lifeline: 623 446 3148 or TTY: (304) 333-2911 TTY (639)438-1769) to talk to a trained counselor call 1-800-273-TALK (toll free, 24 hour hotline) go to [facility] [address], [place] [phone]) call 911 if experiencing a Mental Health or Behavioral Health Crisis  check blood pressure 3 times per week choose a place to take my blood pressure (home, clinic or office, retail store) write blood pressure results in a log or diary learn about high blood pressure keep a blood pressure log take blood pressure log to all doctor appointments develop an action plan for high blood pressure take medications for blood pressure exactly as prescribed report new symptoms to your doctor eat more whole grains, fruits and vegetables, lean meats and healthy fats Education mailed- low sodium diet  Follow Up Plan: Telephone follow up appointment with care management team member scheduled for: 09/22/22 at 130 pm          Plan:Telephone follow up appointment with  care management team member scheduled for:  09/22/22 at 130 PM  Irving Shows [facility], BSN RN Case Manager [facility] [place] [phone]

## 2022-08-13 NOTE — Addendum Note (Signed)
Addended by: Henrene Pastor B on: 08/13/2022 02:05 PM   Modules accepted: Orders

## 2022-08-13 NOTE — Patient Instructions (Signed)
Please call the care guide team at [phone] if you need to cancel or reschedule your appointment.   If you are experiencing a Mental Health or Behavioral Health Crisis or need someone to talk to, please call the Suicide and Crisis Lifeline: 988 call the [place] National Suicide Prevention Lifeline: 450 392 0046 or TTY: 603-090-7726 TTY 450-554-5792) to talk to a trained counselor call 1-800-273-TALK (toll free, 24 hour hotline) go to [facility] [address] [place] [phone]) call 911   Following is a copy of the CCM Program Consent:  CCM service includes personalized support from designated clinical staff supervised by the physician, including individualized plan of care and coordination with other care providers 24/7 contact phone numbers for assistance for urgent and routine care needs. Service will only be billed when office clinical staff spend 20 minutes or more in a month to coordinate care. Only one practitioner may furnish and bill the service in a calendar month. The patient may stop CCM services at amy time (effective at the end of the month) by phone call to the office staff. The patient will be responsible for cost sharing (co-pay) or up to 20% of the service fee (after annual deductible is met)  Following is a copy of your full provider care plan:   Goals Addressed             This Visit's Progress    CCM (DIABETES) EXPECTED OUTCOME: MONITOR, SELF-MANAGE AND REDUCE SYMPTOMS OF DIABETES       Current Barriers:  Knowledge Deficits related to Diabetes management Chronic Disease Management support and education needs related to Diabetes, diet Cognitive Deficits No Advanced Directives in place- pt requests documents be mailed Patient reports she lives alone, is independent with all aspects of her care, does not drive, brother and first cousin provide transportation Patient reports she checks CBG TID with fasting readings in  low 100's with today's reading 123, random readings low 100's Patient reports she has chronic anxiety, nervousness, no depression, is on medication for anxiety, states referral for psychiatrist " is in the works but I haven't heard anything yet"  pt feels seeing psychiatrist in person will help her the most, does not feel telephonic management by social worker would be as effective for her  Pharmacist is currently working with pt for medication adherence, pt reports today she has all medications and taking as prescribed  Planned Interventions: Provided education to patient about basic DM disease process; Reviewed prescribed diet with patient carbohydrate modified; Counseled on importance of regular laboratory monitoring as prescribed;        Discussed plans with patient for ongoing care management follow up and provided patient with direct contact information for care management team;      Provided patient with written educational materials related to hypo and hyperglycemia and importance of correct treatment;       Advised patient, providing education and rationale, to check cbg three times daily and record        call provider for findings outside established parameters;       Review of patient status, including review of consultants reports, relevant laboratory and other test results, and medications completed;       Screening for signs and symptoms of depression related to chronic disease state;        Assessed social determinant of health barriers;        Advanced directives mailed In basket message sent to Dr. [name] with update and clarification on  where psychiatry referral was sent  Symptom Management: Take medications as prescribed   Attend all scheduled provider appointments Call pharmacy for medication refills 3-7 days in advance of running out of medications Attend church or other social activities Perform all self care activities independently  Call provider office for new  concerns or questions  check blood sugar at prescribed times: three times daily check feet daily for cuts, sores or redness enter blood sugar readings and medication or insulin into daily log take the blood sugar log to all doctor visits take the blood sugar meter to all doctor visits trim toenails straight across fill half of plate with vegetables limit fast food meals to no more than 1 per week manage portion size read food labels for fat, fiber, carbohydrates and portion size set a realistic goal Advanced directives mailed- please look over and complete Education mailed- hypoglycemia Psychiatry referral is being checked on, hopefully you will hear something soon  Follow Up Plan: Telephone follow up appointment with care management team member scheduled for:  09/22/22 at 130 pm       CCM (HYPERTENSION) EXPECTED OUTCOME: SELF-MANAGE AND REDUCE SYMPTOMS OF HYPERTENSION       Current Barriers:  Knowledge Deficits related to Hypertension management Care Coordination needs related to medication management in a patient with Hypertension Chronic Disease Management support and education needs related to Hypertension, diet Patient reports she has blood pressure cuff but does not monitor blood pressure, pt exercises some throughout the week, likes to walk  Planned Interventions: Evaluation of current treatment plan related to hypertension self management and patient's adherence to plan as established by provider;   Reviewed prescribed diet low sodium Reviewed medications with patient and discussed importance of compliance;  Counseled on the importance of exercise goals with target of 150 minutes per week Discussed plans with patient for ongoing care management follow up and provided patient with direct contact information for care management team; Advised patient, providing education and rationale, to monitor blood pressure daily and record, calling PCP for findings outside established  parameters;  Provided education on prescribed diet low sodium;  Discussed complications of poorly controlled blood pressure such as heart disease, stroke, circulatory complications, vision complications, kidney impairment, sexual dysfunction;  Screening for signs and symptoms of depression related to chronic disease state;  Assessed social determinant of health barriers;   Symptom Management: Take medications as prescribed   Attend all scheduled provider appointments Call pharmacy for medication refills 3-7 days in advance of running out of medications Attend church or other social activities Perform all self care activities independently  Call provider office for new concerns or questions  call the Suicide and Crisis Lifeline: 988 call the [place] National Suicide Prevention Lifeline: 3160414157 or TTY: 819-429-3491 TTY 480-077-6069) to talk to a trained counselor call 1-800-273-TALK (toll free, 24 hour hotline) go to [facility] [address], [place] [phone]) call 911 if experiencing a Mental Health or Behavioral Health Crisis  check blood pressure 3 times per week choose a place to take my blood pressure (home, clinic or office, retail store) write blood pressure results in a log or diary learn about high blood pressure keep a blood pressure log take blood pressure log to all doctor appointments develop an action plan for high blood pressure take medications for blood pressure exactly as prescribed report new symptoms to your doctor eat more whole grains, fruits and vegetables, lean meats and healthy fats Education mailed- low sodium diet  Follow Up Plan: Telephone follow up appointment with care management team member scheduled for: 09/22/22 at 130 pm          The patient verbalized understanding of instructions, educational materials, and care plan provided today and agreed to receive a mailed copy of patient instructions,  educational materials, and care plan.  Telephone follow up appointment with care management team member scheduled for:  09/22/22 at 130 pm  Hypoglycemia Hypoglycemia is when the sugar (glucose) level in your blood is too low. Low blood sugar can happen to people who have diabetes and people who do not have diabetes. Low blood sugar can happen quickly, and it can be an emergency. What are the causes? This condition happens most often in people who have diabetes. It may be caused by: Diabetes medicine. Not eating enough, or not eating often enough. Doing more physical activity. Drinking alcohol on an empty stomach. If you do not have diabetes, this condition may be caused by: A tumor in the pancreas. Not eating enough, or not eating for long periods at a time (fasting). A very bad infection or illness. Problems after having weight loss (bariatric) surgery. Kidney failure or liver failure. Certain medicines. What increases the risk? This condition is more likely to develop in people who: Have diabetes and take medicines to lower their blood sugar. Abuse alcohol. Have a very bad illness. What are the signs or symptoms? Mild Hunger. Sweating and feeling clammy. Feeling dizzy or light-headed. Being sleepy or having trouble sleeping. Feeling like you may vomit (nauseous). A fast heartbeat. A headache. Blurry vision. Mood changes, such as: Being grouchy. Feeling worried or nervous (anxious). Tingling or loss of feeling (numbness) around your mouth, lips, or tongue. Moderate Confusion and poor judgment. Behavior changes. Weakness. Uneven heartbeat. Trouble with moving (coordination). Very low Very low blood sugar (severe hypoglycemia) is a medical emergency. It can cause: Fainting. Seizures. Loss of consciousness (coma). Death. How is this treated? Treating low blood sugar Low blood sugar is often treated by eating or drinking something that has sugar in it right away. The  food or drink should contain 15 grams of a fast-acting carb (carbohydrate). Options include: 4 oz (120 mL) of fruit juice. 4 oz (120 mL) of regular soda (not diet soda). A few pieces of hard candy. Check food labels to see how many pieces to eat for 15 grams. 1 Tbsp (15 mL) of sugar or honey. 4 glucose tablets. 1 tube of glucose gel. Treating low blood sugar if you have diabetes If you can think clearly and swallow safely, follow the 15:15 rule: Take 15 grams of a fast-acting carb. Talk with your doctor about how much you should take. Always keep a source of fast-acting carb with you, such as: Glucose tablets (take 4 tablets). A few pieces of hard candy. Check food labels to see how many pieces to eat for 15 grams. 4 oz (120 mL) of fruit juice. 4 oz (120 mL) of regular soda (not diet soda). 1 Tbsp (15 mL) of honey or sugar. 1 tube of glucose gel. Check your blood sugar 15 minutes after you take the carb. If your blood sugar is still at or below 70 mg/dL (3.9 mmol/L), take 15 grams of a carb again. If your blood sugar does not go above 70 mg/dL (3.9 mmol/L) after 3 tries, get help right away. After your blood sugar goes back to normal, eat a meal or a snack within 1 hour.  Treating very low blood sugar  If your blood sugar is below 54 mg/dL (3 mmol/L), you have very low blood sugar, or severe hypoglycemia. This is an emergency. Get medical help right away. If you have very low blood sugar and you cannot eat or drink, you will need to be given a hormone called glucagon. A family member or friend should learn how to check your blood sugar and how to give you glucagon. Ask your doctor if you need to have an emergency glucagon kit at home. Very low blood sugar may also need to be treated in a hospital. Follow these instructions at home: General instructions Take over-the-counter and prescription medicines only as told by your doctor. Stay aware of your blood sugar as told by your doctor. If  you drink alcohol: Limit how much you have to: 0-1 drink a day for women who are not pregnant. 0-2 drinks a day for men. Know how much alcohol is in your drink. In the [place] one drink equals one 12 oz bottle of beer (355 mL), one 5 oz glass of wine (148 mL), or one 1 oz glass of hard liquor (44 mL). Be sure to eat food when you drink alcohol. Know that your body absorbs alcohol quickly. This may lead to low blood sugar later. Be sure to keep checking your blood sugar. Keep all follow-up visits. If you have diabetes:  Always have a fast-acting carb (15 grams) with you to treat low blood sugar. Follow your diabetes care plan as told by your doctor. Make sure you: Know the symptoms of low blood sugar. Check your blood sugar as often as told. Always check it before and after exercise. Always check your blood sugar before you drive. Take your medicines as told. Follow your meal plan. Eat on time. Do not skip meals. Share your diabetes care plan with: Your work or school. People you live with. Carry a card or wear jewelry that says you have diabetes. Where to find more information American Diabetes Association: www.diabetes.org Contact a doctor if: You have trouble keeping your blood sugar in your target range. You have low blood sugar often. Get help right away if: You still have symptoms after you eat or drink something that contains 15 grams of fast-acting carb, and you cannot get your blood sugar above 70 mg/dL by following the 16:10 rule. Your blood sugar is below 54 mg/dL (3 mmol/L). You have a seizure. You faint. These symptoms may be an emergency. Get help right away. Call your local emergency services (911 in the [place] Do not wait to see if the symptoms will go away. Do not drive yourself to the hospital. Summary Hypoglycemia happens when the level of sugar (glucose) in your blood is too low. Low blood sugar can happen to people who have diabetes and people who do not have  diabetes. Low blood sugar can happen quickly, and it can be an emergency. Make sure you know the symptoms of low blood sugar and know how to treat it. Always keep a source of sugar (fast-acting carb) with you to treat low blood sugar. This information is not intended to replace advice given to you by your health care provider. Make sure you discuss any questions you have with your health care provider. Document Revised: 12/07/2019 Document Reviewed: 12/07/2019 Elsevier Patient Education  2024 Elsevier Inc. Low-Sodium Eating Plan Salt (sodium) helps you keep a healthy balance of fluids in your body. Too much sodium can raise your blood pressure. It can also cause fluid and waste  to be held in your body. Your health care provider or dietitian may recommend a low-sodium eating plan if you have high blood pressure (hypertension), kidney disease, liver disease, or heart failure. Eating less sodium can help lower your blood pressure and reduce swelling. It can also protect your heart, liver, and kidneys. What are tips for following this plan? Reading food labels  Check food labels for the amount of sodium per serving. If you eat more than one serving, you must multiply the listed amount by the number of servings. Choose foods with less than 140 milligrams (mg) of sodium per serving. Avoid foods with 300 mg of sodium or more per serving. Always check how much sodium is in a product, even if the label says "unsalted" or "no salt added." Shopping  Buy products labeled as "low-sodium" or "no salt added." Buy fresh foods. Avoid canned foods and pre-made or frozen meals. Avoid canned, cured, or processed meats. Buy breads that have less than 80 mg of sodium per slice. Cooking  Eat more home-cooked food. Try to eat less restaurant, buffet, and fast food. Try not to add salt when you cook. Use salt-free seasonings or herbs instead of table salt or sea salt. Check with your provider or pharmacist before  using salt substitutes. Cook with plant-based oils, such as canola, sunflower, or olive oil. Meal planning When eating at a restaurant, ask if your food can be made with less salt or no salt. Avoid dishes labeled as brined, pickled, cured, or smoked. Avoid dishes made with soy sauce, miso, or teriyaki sauce. Avoid foods that have monosodium glutamate (MSG) in them. MSG may be added to some restaurant food, sauces, soups, bouillon, and canned foods. Make meals that can be grilled, baked, poached, roasted, or steamed. These are often made with less sodium. General information Try to limit your sodium intake to 1,500-2,300 mg each day, or the amount told by your provider. What foods should I eat? Fruits Fresh, frozen, or canned fruit. Fruit juice. Vegetables Fresh or frozen vegetables. "No salt added" canned vegetables. "No salt added" tomato sauce and paste. Low-sodium or reduced-sodium tomato and vegetable juice. Grains Low-sodium cereals, such as oats, puffed wheat and rice, and shredded wheat. Low-sodium crackers. Unsalted rice. Unsalted pasta. Low-sodium bread. Whole grain breads and whole grain pasta. Meats and other proteins Fresh or frozen meat, poultry, seafood, and fish. These should have no added salt. Low-sodium canned tuna and salmon. Unsalted nuts. Dried peas, beans, and lentils without added salt. Unsalted canned beans. Eggs. Unsalted nut butters. Dairy Milk. Soy milk. Cheese that is naturally low in sodium, such as ricotta cheese, fresh mozzarella, or Swiss cheese. Low-sodium or reduced-sodium cheese. Cream cheese. Yogurt. Seasonings and condiments Fresh and dried herbs and spices. Salt-free seasonings. Low-sodium mustard and ketchup. Sodium-free salad dressing. Sodium-free light mayonnaise. Fresh or refrigerated horseradish. Lemon juice. Vinegar. Other foods Homemade, reduced-sodium, or low-sodium soups. Unsalted popcorn and pretzels. Low-salt or salt-free chips. The items listed  above may not be all the foods and drinks you can have. Talk to a dietitian to learn more. What foods should I avoid? Vegetables Sauerkraut, pickled vegetables, and relishes. Olives. [place] fries. Onion rings. Regular canned vegetables, except low-sodium or reduced-sodium items. Regular canned tomato sauce and paste. Regular tomato and vegetable juice. Frozen vegetables in sauces. Grains Instant hot cereals. Bread stuffing, pancake, and biscuit mixes. Croutons. Seasoned rice or pasta mixes. Noodle soup cups. Boxed or frozen macaroni and cheese. Regular salted crackers. Self-rising flour. Meats and  other proteins Meat or fish that is salted, canned, smoked, spiced, or pickled. Precooked or cured meat, such as sausages or meat loaves. Tomasa Blase. Ham. Pepperoni. Hot dogs. Corned beef. Chipped beef. Salt pork. Jerky. Pickled herring, anchovies, and sardines. Regular canned tuna. Salted nuts. Dairy Processed cheese and cheese spreads. Hard cheeses. Cheese curds. Blue cheese. Feta cheese. String cheese. Regular cottage cheese. Buttermilk. Canned milk. Fats and oils Salted butter. Regular margarine. Ghee. Bacon fat. Seasonings and condiments Onion salt, garlic salt, seasoned salt, table salt, and sea salt. Canned and packaged gravies. Worcestershire sauce. Tartar sauce. Barbecue sauce. Teriyaki sauce. Soy sauce, including reduced-sodium soy sauce. Steak sauce. Fish sauce. Oyster sauce. Cocktail sauce. Horseradish that you find on the shelf. Regular ketchup and mustard. Meat flavorings and tenderizers. Bouillon cubes. Hot sauce. Pre-made or packaged marinades. Pre-made or packaged taco seasonings. Relishes. Regular salad dressings. Salsa. Other foods Salted popcorn and pretzels. Corn chips and puffs. Potato and tortilla chips. Canned or dried soups. Pizza. Frozen entrees and pot pies. The items listed above may not be all the foods and drinks you should avoid. Talk to a dietitian to learn more. This information  is not intended to replace advice given to you by your health care provider. Make sure you discuss any questions you have with your health care provider. Document Revised: 01/22/2022 Document Reviewed: 01/22/2022 Elsevier Patient Education  2024 [employer].

## 2022-08-13 NOTE — Telephone Encounter (Signed)
Spoke to patient about medictions. Reviewed medications and will make med list for her with best time of administration and indications for each medication.  Noticed that her med list has aspirin 81mg  twice a day - it looks like this was initially started by ortho For VTE prophylaxis after her knee replacement in 2022.  It looks like she was in hospital for nausea/vomiting, dark tarry stools in the setting of NSAID abuse with ibuprofen.  Underwent EGD on 02/06/2022 by Dr. 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   Patient's hemoglobin remained stable during hospitalization and was 11.0 at time of discharge.  GI recommends holding aspirin for 5 days posthospitalization then may resume.  Continue Protonix 40 mg p.o. twice daily x 4 weeks followed by once daily.  Continue to encourage abstinence of NSAIDs.   Recommended lowering dose of aspirin to just 81mg  once a day.  She has been instructed to restart pantoprazole 40mg  daily and avoid NSAIDs

## 2022-08-13 NOTE — Plan of Care (Signed)
Chronic Care Management Provider Comprehensive Care Plan    08/13/2022 Name: Casey [name] MRN: [id number] DOB: 10-27-1935  Referral to Chronic Care Management (CCM) services was placed by Provider:  Jacquiline Doe MD on Date: 06/11/22  Chronic Condition 1: DIABETES Provider Assessment and Plan  Type 2 diabetes mellitus with microalbuminuria, without long-term current use of insulin (HCC) To early to recheck A1c.  Continue Jardiance 10 mg daily.  Check glucose on c-Met.  Expected Outcome/Goals Addressed This Visit (Provider CCM goals/Provider Assessment and plan  CCM (DIABETES) EXPECTED OUTCOME: MONITOR, SELF-MANAGE AND REDUCE SYMPTOMS OF DIABETES   Symptom Management Condition 1: Take medications as prescribed   Attend all scheduled provider appointments Call pharmacy for medication refills 3-7 days in advance of running out of medications Attend church or other social activities Perform all self care activities independently  Call provider office for new concerns or questions  check blood sugar at prescribed times: three times daily check feet daily for cuts, sores or redness enter blood sugar readings and medication or insulin into daily log take the blood sugar log to all doctor visits take the blood sugar meter to all doctor visits trim toenails straight across fill half of plate with vegetables limit fast food meals to no more than 1 per week manage portion size read food labels for fat, fiber, carbohydrates and portion size set a realistic goal Advanced directives mailed- please look over and complete Education mailed- hypoglycemia Psychiatry referral is being checked on, hopefully you will hear something soon  Chronic Condition 2: HYPERTENSION Provider Assessment and Plan Hypertension associated with diabetes (HCC) Blood pressure at goal on amlodipine 5 mg daily.   Expected Outcome/Goals Addressed This Visit (Provider CCM goals/Provider Assessment and plan  CCM  (HYPERTENSION) EXPECTED OUTCOME: SELF-MANAGE AND REDUCE SYMPTOMS OF HYPERTENSION  Symptom Management Condition 2: Take medications as prescribed   Attend all scheduled provider appointments Call pharmacy for medication refills 3-7 days in advance of running out of medications Attend church or other social activities Perform all self care activities independently  Call provider office for new concerns or questions  call the Suicide and Crisis Lifeline: 988 call the [place] National Suicide Prevention Lifeline: 928-582-8049 or TTY: 762-062-2995 TTY 210-008-3269) to talk to a trained counselor call 1-800-273-TALK (toll free, 24 hour hotline) go to [facility] [address], [place] [phone]) call 911 if experiencing a Mental Health or Behavioral Health Crisis  check blood pressure 3 times per week choose a place to take my blood pressure (home, clinic or office, retail store) write blood pressure results in a log or diary learn about high blood pressure keep a blood pressure log take blood pressure log to all doctor appointments develop an action plan for high blood pressure take medications for blood pressure exactly as prescribed report new symptoms to your doctor eat more whole grains, fruits and vegetables, lean meats and healthy fats Education mailed- low sodium diet  Problem List Patient Active Problem List   Diagnosis Date Noted   Leg edema 08/04/2022   Poor compliance with medication 06/11/2022   Chronic painful diabetic neuropathy (HCC) 06/11/2022   Debility 04/29/2022   Renal cyst, Bilateral 02/12/2022   Chronic pain 12/26/2021   Depression, major, single episode, moderate (HCC) 09/26/2021   Insomnia 09/26/2021   Dementia without behavioral disturbance (HCC) 01/03/2020   PUD (peptic ulcer disease) 07/17/2019   Vitamin B12 deficiency 07/14/2017   Osteoarthritis 07/07/2013   Type 2 diabetes mellitus with microalbuminuria,  without long-term current use of insulin (HCC) 05/26/2012   Long Q-T syndrome 11/26/2010   CYSTOCELE WITH INCOMPLETE UTERINE PROLAPSE 03/10/2010   MICROSCOPIC HEMATURIA 07/29/2009   Sinusitis, chronic 02/23/2008   BENIGN POSITIONAL VERTIGO 04/25/2007   Dyslipidemia associated with type 2 diabetes mellitus (HCC) 03/03/2007   GAD (generalized anxiety disorder) 03/03/2007   Hypertension associated with diabetes (HCC) 03/03/2007   Allergic rhinitis 03/03/2007   Osteoporosis 03/03/2007    Medication Management  Current Outpatient Medications:    amLODipine (NORVASC) 2.5 MG tablet, Take 2 tablets (5 mg total) by mouth daily., Disp: 180 tablet, Rfl: 3   aspirin 81 MG chewable tablet, Chew 1 tablet (81 mg total) by mouth 2 (two) times daily. (Patient taking differently: Chew 81 mg by mouth daily.), Disp: , Rfl:    atorvastatin (LIPITOR) 10 MG tablet, Take 10 mg by mouth daily., Disp: , Rfl:    azelastine (ASTELIN) 0.1 % nasal spray, INSTILL 1 SPRAY INTO BOTH NOSTRILS TWICE DAILY (Patient taking differently: Place 1 spray into both nostrils 2 (two) times daily.), Disp: 30 mL, Rfl: 12   Blood Glucose Monitoring Suppl DEVI, 1 each by Does not apply route in the morning, at noon, and at bedtime. May substitute to any manufacturer covered by patient's insurance.Dx E11.29,, Disp: 1 each, Rfl: 0   busPIRone (BUSPAR) 10 MG tablet, Take 2 tablets (20 mg total) by mouth 2 (two) times daily., Disp: 120 tablet, Rfl: 3   Carboxymethylcellul-Glycerin (REFRESH OPTIVE) 1-0.9 % GEL, Place 1 drop into both eyes daily., Disp: , Rfl:    donepezil (ARICEPT) 10 MG tablet, Take 10 mg by mouth at bedtime., Disp: , Rfl:    furosemide (LASIX) 40 MG tablet, Take 40 mg by mouth every morning., Disp: , Rfl:    gabapentin (NEURONTIN) 100 MG capsule, Take 1 capsule (100 mg total) by mouth 3 (three) times daily., Disp: 90 capsule, Rfl: 5   HYDROcodone-acetaminophen (NORCO/VICODIN) 5-325 MG tablet, Take 1 tablet by mouth every 8  (eight) hours as needed for moderate pain. For chronic pain, Disp: 90 tablet, Rfl: 0   hydrOXYzine (ATARAX) 10 MG tablet, TAKE 1 TO 2 TABLETS(10 TO 20 MG) BY MOUTH THREE TIMES DAILY AS NEEDED, Disp: 180 tablet, Rfl: 1   JARDIANCE 10 MG TABS tablet, TAKE 1 TABLET(10 MG) BY MOUTH DAILY BEFORE BREAKFAST, Disp: 90 tablet, Rfl: 0   meclizine (ANTIVERT) 25 MG tablet, Take 1 tablet (25 mg total) by mouth 3 (three) times daily as needed for dizziness., Disp: 90 tablet, Rfl: 3   Multiple Vitamins-Minerals (CERTAVITE/ANTIOXIDANTS) TABS, Take 1 tablet by mouth at bedtime., Disp: 130 tablet, Rfl: 0   pantoprazole (PROTONIX) 40 MG tablet, Take 40 mg by mouth daily., Disp: , Rfl:    polyethylene glycol powder (GLYCOLAX/MIRALAX) 17 GM/SCOOP powder, Take 17 g by mouth daily. (Patient taking differently: Take 17 g by mouth daily as needed for mild constipation or moderate constipation.), Disp: 238 g, Rfl: 0   potassium chloride SA (KLOR-CON M) 20 MEQ tablet, Take 1 tablet (20 mEq total) by mouth daily., Disp: 30 tablet, Rfl: 5   senna-docusate (SENOKOT-S) 8.6-50 MG tablet, Take 1 tablet by mouth at bedtime as needed for mild constipation., Disp: 30 tablet, Rfl: 0   azelastine (OPTIVAR) 0.05 % ophthalmic solution, Place 1 drop into both eyes 2 (two) times daily. (Patient not taking: Reported on 08/06/2022), Disp: , Rfl:   Cognitive Assessment Identity Confirmed: : Name; DOB Cognitive Status: Normal   Functional Assessment Hearing Difficulty or Deaf:  no Wear Glasses or Blind: yes Vision Management: pt can see well w/ glasses Concentrating, Remembering or Making Decisions Difficulty (CP): no (if forgetful at times) Difficulty Communicating: no Difficulty Eating/Swallowing: no Walking or Climbing Stairs Difficulty: no Dressing/Bathing Difficulty: no Doing Errands Independently Difficulty (such as shopping) (CP): yes Errands Management: pt does not driive, brother and first cousin assist   Caregiver Assessment   Primary Source of Support/Comfort: sibling(s); extended family Name of Support/Comfort Primary Source: brother Yazhini Mcaulay People in Home: alone   Planned Interventions  Provided education to patient about basic DM disease process; Reviewed prescribed diet with patient carbohydrate modified; Counseled on importance of regular laboratory monitoring as prescribed;        Discussed plans with patient for ongoing care management follow up and provided patient with direct contact information for care management team;      Provided patient with written educational materials related to hypo and hyperglycemia and importance of correct treatment;       Advised patient, providing education and rationale, to check cbg three times daily and record        call provider for findings outside established parameters;       Review of patient status, including review of consultants reports, relevant laboratory and other test results, and medications completed;       Screening for signs and symptoms of depression related to chronic disease state;        Assessed social determinant of health barriers;        Advanced directives mailed In basket message sent to Dr. [name] with update and clarification on where psychiatry referral was sent Evaluation of current treatment plan related to hypertension self management and patient's adherence to plan as established by provider;   Reviewed prescribed diet low sodium Reviewed medications with patient and discussed importance of compliance;  Counseled on the importance of exercise goals with target of 150 minutes per week Discussed plans with patient for ongoing care management follow up and provided patient with direct contact information for care management team; Advised patient, providing education and rationale, to monitor blood pressure daily and record, calling PCP for findings outside established parameters;  Provided education on prescribed diet low sodium;   Discussed complications of poorly controlled blood pressure such as heart disease, stroke, circulatory complications, vision complications, kidney impairment, sexual dysfunction;  Screening for signs and symptoms of depression related to chronic disease state;  Assessed social determinant of health barriers;   Interaction and coordination with outside resources, practitioners, and providers See CCM Referral  Care Plan: Printed and mailed to patient

## 2022-08-19 ENCOUNTER — Ambulatory Visit: Payer: 59 | Admitting: Family Medicine

## 2022-08-19 DIAGNOSIS — E1159 Type 2 diabetes mellitus with other circulatory complications: Secondary | ICD-10-CM

## 2022-08-19 DIAGNOSIS — I10 Essential (primary) hypertension: Secondary | ICD-10-CM | POA: Diagnosis not present

## 2022-08-25 ENCOUNTER — Ambulatory Visit (INDEPENDENT_AMBULATORY_CARE_PROVIDER_SITE_OTHER): Payer: 59 | Admitting: Family Medicine

## 2022-08-25 ENCOUNTER — Encounter: Payer: Self-pay | Admitting: Family Medicine

## 2022-08-25 ENCOUNTER — Other Ambulatory Visit: Payer: Self-pay | Admitting: Family Medicine

## 2022-08-25 VITALS — BP 136/77 | HR 76 | Temp 97.7°F | Ht 62.0 in | Wt 158.0 lb

## 2022-08-25 DIAGNOSIS — E1169 Type 2 diabetes mellitus with other specified complication: Secondary | ICD-10-CM

## 2022-08-25 DIAGNOSIS — Z7984 Long term (current) use of oral hypoglycemic drugs: Secondary | ICD-10-CM

## 2022-08-25 DIAGNOSIS — E1159 Type 2 diabetes mellitus with other circulatory complications: Secondary | ICD-10-CM

## 2022-08-25 DIAGNOSIS — R809 Proteinuria, unspecified: Secondary | ICD-10-CM | POA: Diagnosis not present

## 2022-08-25 DIAGNOSIS — Z91148 Patient's other noncompliance with medication regimen for other reason: Secondary | ICD-10-CM

## 2022-08-25 DIAGNOSIS — E785 Hyperlipidemia, unspecified: Secondary | ICD-10-CM

## 2022-08-25 DIAGNOSIS — I10 Essential (primary) hypertension: Secondary | ICD-10-CM

## 2022-08-25 DIAGNOSIS — F0390 Unspecified dementia without behavioral disturbance: Secondary | ICD-10-CM

## 2022-08-25 DIAGNOSIS — E1129 Type 2 diabetes mellitus with other diabetic kidney complication: Secondary | ICD-10-CM

## 2022-08-25 DIAGNOSIS — F411 Generalized anxiety disorder: Secondary | ICD-10-CM

## 2022-08-25 DIAGNOSIS — I152 Hypertension secondary to endocrine disorders: Secondary | ICD-10-CM

## 2022-08-25 DIAGNOSIS — K279 Peptic ulcer, site unspecified, unspecified as acute or chronic, without hemorrhage or perforation: Secondary | ICD-10-CM

## 2022-08-25 MED ORDER — GABAPENTIN 300 MG PO CAPS: 300.0000 mg | ORAL_CAPSULE | Freq: Three times a day (TID) | ORAL | 3 refills | Status: DC

## 2022-08-25 MED ORDER — FLUOXETINE HCL 20 MG PO TABS: 20.0000 mg | ORAL_TABLET | Freq: Every day | ORAL | 3 refills | Status: DC

## 2022-08-25 NOTE — Progress Notes (Signed)
Casey [name] is a 87 y.o. female who presents today for an office visit.  Assessment/Plan:  Chronic Problems Addressed Today: GAD (generalized anxiety disorder) Symptoms are not controlled.  Had extensive discussion with patient today regarding medication treatment options.  We have referred her to psychiatry and this has been authorized however she has not yet heard back from them.  We will double check on this referral.  Hopefully she will be able to see them soon as we are running out of options at this point to help treat her anxiety.  She has tried a couple of SSRIs in the past including Zoloft and Lexapro which were not particularly effective.  We will try starting Prozac 20 mg daily today.  We will also increase her gabapentin to 300 mg 3 times daily.  Hopefully this will help some with her anxiety symptoms as well.  She had previously been on Seroquel but did not feel like this was effective.  Previously was on alprazolam however need to avoid this at this point given her other comorbidities and chronic narcotic use.  She will follow-up with [name] in several weeks.  We can titrate the dose of Prozac and gabapentin as needed.  If does not have any response to Prozac would consider trial of SNRI such as Cymbalta or Pristiq.  As above hopeful she will be able to get into see psychiatry soon.  Type 2 diabetes mellitus with microalbuminuria, without long-term current use of insulin (HCC) To early to recheck A1c.  Check next office visit.  Continue Jardiance 10 mg daily.  Dyslipidemia associated with type 2 diabetes mellitus (HCC) She is now back on atorvastatin.  Will add this back on her medication list.  Last LDL 111.  Hypertension associated with diabetes (HCC) Blood pressure at goal on amlodipine 5 mg daily.  Does not need refill today.  Dementia without behavioral disturbance (HCC) On donepezil 10 mg daily.  Neurology.  She has follow-up with them in a couple of months.  Poor  compliance with medication Had extensive discussion today regarding her medications and reviewed medication list.  Apparently the pharmacy had refilled her omeprazole and she was taking this in conjunction with Protonix.  Advised patient that she should only be on Protonix.  We will take omeprazole off of her medication list and advised her to not fill this at the pharmacy any longer.  With also added atorvastatin back on her medication list as she has been on this now for the last few weeks.  She has not been taking hydroxyzine and will take this off her medication list.  She does have appointment with pharmacy in a few weeks to help with medication adherence.  PUD (peptic ulcer disease) Pharmacy recently medically refilled her omeprazole.  Advised patient she does not need this with her Protonix.  She will discontinue omeprazole continue with Protonix 40 mg daily alone.     Subjective:  HPI:  See A/P for status of chronic conditions.  Patient is here today for follow-up.  We saw her about 3 weeks ago.  At that visit we had extensive discussion regarding medication adherence and compliance.  We did refer her to chronic care management however she was not eligible for the services.  She has thankfully been able to see our  pharmacist who has been helping her with medication management.  At her last visit we also did discuss her anxiety.  At that time she was on BuSpar 10 mg twice daily and  hydroxyzine 3 times daily as needed.  We increased her BuSpar to 20 mg twice daily and refer her to see a psychiatrist.  We continued other medications at that point including amlodipine 5 mg daily and jardiance 10 mg daily. She has her medications with her today for [name] to review.   Overall she feels well today her main concern today is her anxiety.  She does not feel like the BuSpar 20 mg twice daily is adequately controlling her anxiety.  States that she has not heard back from the psychiatrist as of yet.   She would like to try different medication to help with her anxiety.  She has been on several medications for this in the past including Zoloft, Lexapro, and Remeron.  She is also tried Seroquel in the past.  She did not feel like any of these were particularly effective.       Objective:  Physical Exam: BP 136/77   Pulse 76   Temp 97.7 F (36.5 C) (Temporal)   Ht 5\' 2"  (1.575 m)   Wt 158 lb (71.7 kg)   SpO2 98%   BMI 28.90 kg/m   Gen: No acute distress, resting comfortably CV: Regular rate and rhythm with no murmurs appreciated Pulm: Normal work of breathing, clear to auscultation bilaterally with no crackles, wheezes, or rhonchi Neuro: Grossly normal, moves all extremities Psych: Normal affect and thought content  Time Spent: 45 minutes of total time was spent on the date of the encounter performing the following actions: chart review prior to seeing the patient, obtaining history, performing a medically necessary exam, counseling on the treatment plan, placing orders, and documenting in our EHR.        [name]. [name], MD 08/25/2022 12:01 PM

## 2022-08-25 NOTE — Assessment & Plan Note (Signed)
On donepezil 10 mg daily.  Neurology.  She has follow-up with them in a couple of months.

## 2022-08-25 NOTE — Assessment & Plan Note (Signed)
Pharmacy recently medically refilled her omeprazole.  Advised patient she does not need this with her Protonix.  She will discontinue omeprazole continue with Protonix 40 mg daily alone.

## 2022-08-25 NOTE — Assessment & Plan Note (Signed)
Blood pressure at goal on amlodipine 5 mg daily.  Does not need refill today.

## 2022-08-25 NOTE — Assessment & Plan Note (Signed)
Symptoms are not controlled.  Had extensive discussion with patient today regarding medication treatment options.  We have referred her to psychiatry and this has been authorized however she has not yet heard back from them.  We will double check on this referral.  Hopefully she will be able to see them soon as we are running out of options at this point to help treat her anxiety.  She has tried a couple of SSRIs in the past including Zoloft and Lexapro which were not particularly effective.  We will try starting Prozac 20 mg daily today.  We will also increase her gabapentin to 300 mg 3 times daily.  Hopefully this will help some with her anxiety symptoms as well.  She had previously been on Seroquel but did not feel like this was effective.  Previously was on alprazolam however need to avoid this at this point given her other comorbidities and chronic narcotic use.  She will follow-up with [name] in several weeks.  We can titrate the dose of Prozac and gabapentin as needed.  If does not have any response to Prozac would consider trial of SNRI such as Cymbalta or Pristiq.  As above hopeful she will be able to get into see psychiatry soon.

## 2022-08-25 NOTE — Assessment & Plan Note (Signed)
To early to recheck A1c.  Check next office visit.  Continue Jardiance 10 mg daily.

## 2022-08-25 NOTE — Assessment & Plan Note (Signed)
She is now back on atorvastatin.  Will add this back on her medication list.  Last LDL 111.

## 2022-08-25 NOTE — Patient Instructions (Signed)
It was very nice to see you today!  Please start the Prozac 20 mg daily.  We have also increased your dose of gabapentin 300 mg 3 times daily.  Please stop taking the omeprazole.  Return in about 1 month (around 09/25/2022).   Take care, Dr [name]  PLEASE NOTE:  If you had any lab tests, please let us know if you have not heard back within a few days. You may see your results on mychart before we have a chance to review them but we will give you a call once they are reviewed by [name].   If we ordered any referrals today, please let us know if you have not heard from their office within the next week.   If you had any urgent prescriptions sent in today, please check with the pharmacy within an hour of our visit to make sure the prescription was transmitted appropriately.   Please try these tips to maintain a healthy lifestyle:  Eat at least 3 REAL meals and 1-2 snacks per day.  Aim for no more than 5 hours between eating.  If you eat breakfast, please do so within one hour of getting up.   Each meal should contain half fruits/vegetables, one quarter protein, and one quarter carbs (no bigger than a computer mouse)  Cut down on sweet beverages. This includes juice, soda, and sweet tea.   Drink at least 1 glass of water with each meal and aim for at least 8 glasses per day  Exercise at least 150 minutes every week.

## 2022-08-25 NOTE — Assessment & Plan Note (Signed)
Had extensive discussion today regarding her medications and reviewed medication list.  Apparently the pharmacy had refilled her omeprazole and she was taking this in conjunction with Protonix.  Advised patient that she should only be on Protonix.  We will take omeprazole off of her medication list and advised her to not fill this at the pharmacy any longer.  With also added atorvastatin back on her medication list as she has been on this now for the last few weeks.  She has not been taking hydroxyzine and will take this off her medication list.  She does have appointment with pharmacy in a few weeks to help with medication adherence.

## 2022-08-29 ENCOUNTER — Other Ambulatory Visit: Payer: Self-pay

## 2022-08-29 ENCOUNTER — Emergency Department (HOSPITAL_COMMUNITY)
Admission: EM | Admit: 2022-08-29 | Discharge: 2022-08-29 | Disposition: A | Discharge status: Home / Self Care | Payer: 59 | Source: Home / Self Care | Attending: Emergency Medicine | Admitting: Emergency Medicine

## 2022-08-29 ENCOUNTER — Emergency Department (HOSPITAL_COMMUNITY): Payer: 59

## 2022-08-29 DIAGNOSIS — I10 Essential (primary) hypertension: Secondary | ICD-10-CM | POA: Diagnosis not present

## 2022-08-29 DIAGNOSIS — J069 Acute upper respiratory infection, unspecified: Secondary | ICD-10-CM | POA: Insufficient documentation

## 2022-08-29 DIAGNOSIS — Z7982 Long term (current) use of aspirin: Secondary | ICD-10-CM | POA: Insufficient documentation

## 2022-08-29 DIAGNOSIS — R0981 Nasal congestion: Secondary | ICD-10-CM | POA: Diagnosis present

## 2022-08-29 DIAGNOSIS — Z20822 Contact with and (suspected) exposure to covid-19: Secondary | ICD-10-CM | POA: Insufficient documentation

## 2022-08-29 DIAGNOSIS — R42 Dizziness and giddiness: Secondary | ICD-10-CM | POA: Diagnosis not present

## 2022-08-29 DIAGNOSIS — Z79899 Other long term (current) drug therapy: Secondary | ICD-10-CM | POA: Diagnosis not present

## 2022-08-29 DIAGNOSIS — E1140 Type 2 diabetes mellitus with diabetic neuropathy, unspecified: Secondary | ICD-10-CM | POA: Diagnosis not present

## 2022-08-29 DIAGNOSIS — R519 Headache, unspecified: Secondary | ICD-10-CM | POA: Insufficient documentation

## 2022-08-29 LAB — CBC
HCT: 38.5 % (ref 36.0–46.0)
Hemoglobin: 12.2 g/dL (ref 12.0–15.0)
MCH: 28 pg (ref 26.0–34.0)
MCHC: 31.7 g/dL (ref 30.0–36.0)
MCV: 88.5 fL (ref 80.0–100.0)
Platelets: 263 10*3/uL (ref 150–400)
RBC: 4.35 MIL/uL (ref 3.87–5.11)
RDW: 14.2 % (ref 11.5–15.5)
WBC: 7.1 10*3/uL (ref 4.0–10.5)
nRBC: 0 % (ref 0.0–0.2)

## 2022-08-29 LAB — BASIC METABOLIC PANEL
Anion gap: 9 (ref 5–15)
BUN: 13 mg/dL (ref 8–23)
CO2: 26 mmol/L (ref 22–32)
Calcium: 9.3 mg/dL (ref 8.9–10.3)
Chloride: 102 mmol/L (ref 98–111)
Creatinine, Ser: 1.02 mg/dL — ABNORMAL HIGH (ref 0.44–1.00)
GFR, Estimated: 54 mL/min — ABNORMAL LOW (ref >60)
Glucose, Bld: 143 mg/dL — ABNORMAL HIGH (ref 70–99)
Potassium: 3.9 mmol/L (ref 3.5–5.1)
Sodium: 137 mmol/L (ref 135–145)

## 2022-08-29 LAB — RESP PANEL BY RT-PCR (RSV, FLU A&B, COVID)  RVPGX2
Influenza A by PCR: NEGATIVE
Influenza B by PCR: NEGATIVE
Resp Syncytial Virus by PCR: NEGATIVE
SARS Coronavirus 2 by RT PCR: NEGATIVE

## 2022-08-29 MED ORDER — HYDROCODONE-ACETAMINOPHEN 5-325 MG PO TABS
1.0000 | ORAL_TABLET | Freq: Once | ORAL | Status: AC
  Administered 2022-08-29: 1 via ORAL
  Filled 2022-08-29: qty 1

## 2022-08-29 MED ORDER — SODIUM CHLORIDE 0.9 % IV SOLN
1000.0000 mL | INTRAVENOUS | Status: DC
  Administered 2022-08-29: 1000 mL via INTRAVENOUS

## 2022-08-29 MED ORDER — AMOXICILLIN 500 MG PO CAPS
500.0000 mg | ORAL_CAPSULE | Freq: Once | ORAL | Status: AC
  Administered 2022-08-29: 500 mg via ORAL
  Filled 2022-08-29: qty 1

## 2022-08-29 MED ORDER — MORPHINE SULFATE (PF) 4 MG/ML IV SOLN
4.0000 mg | Freq: Once | INTRAVENOUS | Status: AC
  Administered 2022-08-29: 4 mg via INTRAVENOUS
  Filled 2022-08-29: qty 1

## 2022-08-29 MED ORDER — AMOXICILLIN 500 MG PO CAPS: 500.0000 mg | ORAL_CAPSULE | Freq: Three times a day (TID) | ORAL | 0 refills | Status: DC

## 2022-08-29 MED ORDER — NAPROXEN 250 MG PO TABS: 250.0000 mg | ORAL_TABLET | Freq: Two times a day (BID) | ORAL | 0 refills | Status: AC

## 2022-08-29 MED ORDER — ONDANSETRON HCL 4 MG/2ML IJ SOLN
4.0000 mg | Freq: Once | INTRAMUSCULAR | Status: AC
  Administered 2022-08-29: 4 mg via INTRAVENOUS
  Filled 2022-08-29: qty 2

## 2022-08-29 MED ORDER — SODIUM CHLORIDE 0.9 % IV BOLUS (SEPSIS)
500.0000 mL | Freq: Once | INTRAVENOUS | Status: AC
  Administered 2022-08-29: 500 mL via INTRAVENOUS

## 2022-08-29 MED ORDER — FLUTICASONE PROPIONATE 50 MCG/ACT NA SUSP: 1.0000 | Freq: Every day | NASAL | 2 refills | Status: DC

## 2022-08-29 NOTE — ED Notes (Signed)
Pt is a&ox4, warm and dry to touch. Pt complains of a HA for the last 2 days, tylenol is not working. Pt describes pain as throbbing 10/10. Pt also complains of feeling dizzy. Pt denies any chest pain/shob/n/v. Pt changed into a gown, attached to monitor/vitals. Side rails up x 2, call light with patient. Warm blanket provided.

## 2022-08-29 NOTE — ED Provider Notes (Signed)
[place] EMERGENCY DEPARTMENT AT [facility] Provider Note   CSN: [id number] Arrival date & time: 08/29/22  1137     History {Add pertinent medical, surgical, social history, OB history to HPI:1} Chief Complaint  Patient presents with   Dizziness    Pt is coming from home with a HA for 2 days, as well as dizziness. Pt had been treating at home with tylenol, no relief. Pt denies cp/shob. No neuro def noted or reported.     Casey [name] is a 87 y.o. female.   Dizziness    Patient has a history of hypertension hypercholesterolemia, sinusitis, osteoporosis benign positional vertigo peripheral neuropathy diabetes who presents to the ED for evaluation of headache and dizziness.  Patient states her symptoms started a couple days ago.  She has been feeling lightheaded.  She has been coughing a bit and has had some mucus that she has been bringing up.  She does feel like her sinuses are congested.  She denies any focal numbness or weakness.  Patient has not fallen.  She is also complaining of headache that is throughout her entire head.  No known fevers or chills.  No vomiting although she has had some loose stools recently.  She is not having abdominal pain.  No chest pain.  Home Medications Prior to Admission medications   Medication Sig Start Date End Date Taking? Authorizing Provider  amLODipine (NORVASC) 2.5 MG tablet Take 2 tablets (5 mg total) by mouth daily. 07/16/22   Ardith Dark, MD  aspirin 81 MG chewable tablet Chew 1 tablet (81 mg total) by mouth daily. 08/13/22   Ardith Dark, MD  atorvastatin (LIPITOR) 10 MG tablet Take 10 mg by mouth daily.    [provider]  azelastine (ASTELIN) 0.1 % nasal spray INSTILL 1 SPRAY INTO BOTH NOSTRILS TWICE DAILY Patient taking differently: Place 1 spray into both nostrils 2 (two) times daily. 08/19/21   [name], NP  azelastine (OPTIVAR) 0.05 % ophthalmic solution Place 1 drop into both eyes 2 (two) times daily.     [provider]  Blood Glucose Monitoring Suppl DEVI 1 each by Does not apply route in the morning, at noon, and at bedtime. May substitute to any manufacturer covered by patient's insurance.Dx E11.29, 06/17/22   Ardith Dark, MD  busPIRone (BUSPAR) 10 MG tablet Take 2 tablets (20 mg total) by mouth 2 (two) times daily. 08/04/22   Ardith Dark, MD  Carboxymethylcellul-Glycerin (REFRESH OPTIVE) 1-0.9 % GEL Place 1 drop into both eyes daily.    [provider]  donepezil (ARICEPT) 10 MG tablet Take 10 mg by mouth at bedtime.    [provider]  FLUoxetine (PROZAC) 20 MG tablet Take 1 tablet (20 mg total) by mouth daily. 08/25/22   Ardith Dark, MD  furosemide (LASIX) 40 MG tablet Take 40 mg by mouth every morning.    [provider]  gabapentin (NEURONTIN) 300 MG capsule Take 1 capsule (300 mg total) by mouth 3 (three) times daily. 08/25/22   Ardith Dark, MD  HYDROcodone-acetaminophen (NORCO/VICODIN) 5-325 MG tablet Take 1 tablet by mouth every 8 (eight) hours as needed for moderate pain. For chronic pain 07/27/22   Ardith Dark, MD  JARDIANCE 10 MG TABS tablet TAKE 1 TABLET(10 MG) BY MOUTH DAILY BEFORE BREAKFAST 07/24/22   Ardith Dark, MD  meclizine (ANTIVERT) 25 MG tablet Take 1 tablet (25 mg total) by mouth 3 (three) times daily as  needed for dizziness. 03/23/22   Ardith Dark, MD  Multiple Vitamins-Minerals (CERTAVITE/ANTIOXIDANTS) TABS Take 1 tablet by mouth at bedtime. 06/02/22   Drema Dallas, MD  pantoprazole (PROTONIX) 40 MG tablet Take 40 mg by mouth daily.    [provider]  polyethylene glycol powder (GLYCOLAX/MIRALAX) 17 GM/SCOOP powder Take 17 g by mouth daily. Patient taking differently: Take 17 g by mouth daily as needed for mild constipation or moderate constipation. 06/11/22   Ardith Dark, MD  potassium chloride SA (KLOR-CON M) 20 MEQ tablet Take 1 tablet (20 mEq total) by mouth daily. 08/04/22   Ardith Dark, MD   senna-docusate (SENOKOT-S) 8.6-50 MG tablet Take 1 tablet by mouth at bedtime as needed for mild constipation. 06/02/22   Drema Dallas, MD      Allergies    Patient has no known allergies.    Review of Systems   Review of Systems  Neurological:  Positive for dizziness.    Physical Exam Updated Vital Signs BP (!) 134/59   Pulse (!) 59   Temp 97.8 F (36.6 C) (Oral)   Resp 16   Ht 1.575 m (5\' 2" )   Wt 71.7 kg   SpO2 100%   BMI 28.91 kg/m  Physical Exam Vitals and nursing note reviewed.  Constitutional:      General: She is not in acute distress.    Appearance: She is well-developed.  HENT:     Head: Normocephalic and atraumatic.     Right Ear: External ear normal.     Left Ear: External ear normal.  Eyes:     General: No visual field deficit or scleral icterus.       Right eye: No discharge.        Left eye: No discharge.     Conjunctiva/sclera: Conjunctivae normal.  Neck:     Trachea: No tracheal deviation.  Cardiovascular:     Rate and Rhythm: Normal rate and regular rhythm.  Pulmonary:     Effort: Pulmonary effort is normal. No respiratory distress.     Breath sounds: Normal breath sounds. No stridor. No wheezing or rales.  Abdominal:     General: Bowel sounds are normal. There is no distension.     Palpations: Abdomen is soft.     Tenderness: There is no abdominal tenderness. There is no guarding or rebound.  Musculoskeletal:        General: No tenderness.     Cervical back: Neck supple.  Skin:    General: Skin is warm and dry.     Findings: No rash.  Neurological:     Mental Status: She is alert and oriented to person, place, and time.     Cranial Nerves: No cranial nerve deficit, dysarthria or facial asymmetry.     Sensory: No sensory deficit.     Motor: No abnormal muscle tone, seizure activity or pronator drift.     Coordination: Coordination normal.     Comments:  able to hold both legs off bed for 5 seconds, sensation intact in all extremities,   no left or right sided neglect, normal finger-nose exam bilaterally, no nystagmus noted   Psychiatric:        Mood and Affect: Mood normal.     ED Results / Procedures / Treatments   Labs (all labs ordered are listed, but only abnormal results are displayed) Labs Reviewed  RESP PANEL BY RT-PCR (RSV, FLU A&B, COVID)  [id number]  CBC  BASIC METABOLIC PANEL  EKG None  Radiology CT Head Wo Contrast  Result Date: 08/29/2022 CLINICAL DATA:  87 year old female with new onset headache. EXAM: CT HEAD WITHOUT CONTRAST TECHNIQUE: Contiguous axial images were obtained from the base of the skull through the vertex without intravenous contrast. RADIATION DOSE REDUCTION: This exam was performed according to the departmental dose-optimization program which includes automated exposure control, adjustment of the mA and/or kV according to patient size and/or use of iterative reconstruction technique. COMPARISON:  Brain MRI 12/27/2020.  Head CT 11/22/2021. FINDINGS: Brain: Cerebral volume remains normal for age. No midline shift, ventriculomegaly, mass effect, evidence of mass lesion, intracranial hemorrhage or evidence of cortically based acute infarction. Gray-white differentiation is within normal limits for age. Vascular: No suspicious intracranial vascular hyperdensity. Distal left vertebral artery appears dominant, normal variant. Skull: No acute osseous abnormality identified. Sinuses/Orbits: Visualized paranasal sinuses and mastoids are stable and well aerated. Other: No acute orbit or scalp soft tissue finding. IMPRESSION: Stable and negative for age noncontrast Head CT. Electronically Signed   By: Odessa Fleming M.D.   On: 08/29/2022 12:29    Procedures Procedures  {Document cardiac monitor, telemetry assessment procedure when appropriate:1}  Medications Ordered in ED Medications  sodium chloride 0.9 % bolus 500 mL (has no administration in time range)    Followed by  0.9 %  sodium chloride infusion  (has no administration in time range)  morphine (PF) 4 MG/ML injection 4 mg (has no administration in time range)  ondansetron (ZOFRAN) injection 4 mg (has no administration in time range)    ED Course/ Medical Decision Making/ A&P Clinical Course as of 08/29/22 1424  Sat Aug 29, 2022  1318 CBC normal.  Metabolic panel normal.  Covid flu negative [JK]  1318 CT scan without acute abnormality [JK]    Clinical Course User Index [JK] Linwood Dibbles, MD   {   Click here for ABCD2, HEART and other calculatorsREFRESH Note before signing :1}                              Medical Decision Making Amount and/or Complexity of Data Reviewed Labs: ordered. Radiology: ordered.  Risk Prescription drug management.   ***  {Document critical care time when appropriate:1} {Document review of labs and clinical decision tools ie heart score, Chads2Vasc2 etc:1}  {Document your independent review of radiology images, and any outside records:1} {Document your discussion with family members, caretakers, and with consultants:1} {Document social determinants of health affecting pt's care:1} {Document your decision making why or why not admission, treatments were needed:1} Final Clinical Impression(s) / ED Diagnoses Final diagnoses:  None    Rx / DC Orders ED Discharge Orders     None

## 2022-08-29 NOTE — Discharge Instructions (Signed)
Take the medications as prescribed to help with your headache and sinus congestion.  Follow-up with your doctor next week to be rechecked.  Return as needed for worsening symptoms

## 2022-08-31 ENCOUNTER — Other Ambulatory Visit: Payer: Self-pay | Admitting: *Deleted

## 2022-08-31 ENCOUNTER — Telehealth: Payer: Self-pay | Admitting: Family Medicine

## 2022-08-31 MED ORDER — EMPAGLIFLOZIN 25 MG PO TABS: 25.0000 mg | ORAL_TABLET | Freq: Every day | ORAL | 1 refills | Status: DC

## 2022-08-31 NOTE — Telephone Encounter (Signed)
Requests new RX with dosage increase for Jardiance be sent to:   [id number] [place], [place] - [address] AT [facility] Phone: [phone]  Fax: [phone]

## 2022-08-31 NOTE — Telephone Encounter (Signed)
Patient requesting to increase Rx Jardiance due to elevated glucose  Stated glucose running 150-200 Please advise

## 2022-08-31 NOTE — Transitions of Care (Post Inpatient/ED Visit) (Signed)
08/31/2022  Name: Casey [name] MRN: [id number] DOB: [birth year]  Today's TOC FU Call Status: Today's TOC FU Call Status:: Successful TOC FU Call Completed TOC FU Call Complete Date: 08/31/22  Transition Care Management Follow-up Telephone Call Date of Discharge: 08/29/22 Discharge Facility: [facility]) Type of Discharge: Emergency Department Reason for ED Visit: Other: (sinus headache) How have you been since you were released from the hospital?: Better Any questions or concerns?: No  Items Reviewed: Medications obtained,verified, and reconciled?: Yes (Medications Reviewed) Any new allergies since your discharge?: No Dietary orders reviewed?: Yes Type of Diet Ordered:: carbohydrate modified Do you have support at home?: Yes People in Home: alone Name of Support/Comfort Primary Source: brother Shatavia Tippery  Medications Reviewed Today: Medications Reviewed Today     Reviewed by Audrie Gallus, RN (Registered Nurse) on 08/31/22 at 1231  Med List Status: <None>   Medication Order Taking? Sig Documenting Provider Last Dose Status Informant  amLODipine (NORVASC) 2.5 MG tablet [id number] Yes Take 2 tablets (5 mg total) by mouth daily. Ardith Dark, MD Taking Active   amoxicillin (AMOXIL) 500 MG capsule [id number] Yes Take 1 capsule (500 mg total) by mouth 3 (three) times daily. Linwood Dibbles, MD Taking Active   aspirin 81 MG chewable tablet [id number] Yes Chew 1 tablet (81 mg total) by mouth daily. Ardith Dark, MD Taking Active   atorvastatin (LIPITOR) 10 MG tablet [id number] Yes Take 10 mg by mouth daily. [provider] Taking Active   azelastine (ASTELIN) 0.1 % nasal spray [id number] Yes INSTILL 1 SPRAY INTO BOTH NOSTRILS TWICE DAILY  Patient taking differently: Place 1 spray into both nostrils 2 (two) times daily.   [name], NP Taking Active Self  azelastine (OPTIVAR) 0.05 % ophthalmic solution [id number] Yes Place 1 drop into both eyes 2 (two) times daily.  [provider] Taking Active Self  Blood Glucose Monitoring Suppl DEVI [id number] Yes 1 each by Does not apply route in the morning, at noon, and at bedtime. May substitute to any manufacturer covered by patient's insurance.Dx E11.29, Ardith Dark, MD Taking Active   busPIRone (BUSPAR) 10 MG tablet [id number] Yes Take 2 tablets (20 mg total) by mouth 2 (two) times daily. Ardith Dark, MD Taking Active   Carboxymethylcellul-Glycerin [facility] OPTIVE) 1-0.9 % GEL [id number] Yes Place 1 drop into both eyes daily. [provider] Taking Active Self  donepezil (ARICEPT) 10 MG tablet [id number] Yes Take 10 mg by mouth at bedtime. [provider] Taking Active   empagliflozin (JARDIANCE) 25 MG TABS tablet [id number] Yes Take 1 tablet (25 mg total) by mouth daily before breakfast. Ardith Dark, MD Taking Active   FLUoxetine (PROZAC) 20 MG tablet [id number] Yes Take 1 tablet (20 mg total) by mouth daily. Ardith Dark, MD Taking Active   fluticasone [facility]) 50 MCG/ACT nasal spray [id number] Yes Place 1 spray into both nostrils daily for 7 days. Linwood Dibbles, MD Taking Active   furosemide (LASIX) 40 MG tablet [id number] Yes Take 40 mg by mouth every morning. [provider] Taking Active   gabapentin (NEURONTIN) 300 MG capsule [id number] Yes Take 1 capsule (300 mg total) by mouth 3 (three) times daily. Ardith Dark, MD Taking Active   HYDROcodone-acetaminophen (NORCO/VICODIN) 5-325 MG tablet [id number] Yes Take 1 tablet by mouth every 8 (eight) hours as needed for moderate pain. For chronic pain Ardith Dark, MD Taking Active   meclizine (ANTIVERT) 25 MG tablet [id number] Yes Take 1 tablet (  25 mg total) by mouth 3 (three) times daily as needed for dizziness. Ardith Dark, MD Taking Active Self  Multiple Vitamins-Minerals (CERTAVITE/ANTIOXIDANTS) TABS [id number] Yes Take 1 tablet by mouth at bedtime. Drema Dallas, MD Taking Active   naproxen (NAPROSYN) 250 MG tablet  [id number] Yes Take 1 tablet (250 mg total) by mouth 2 (two) times daily with a meal for 7 days. Linwood Dibbles, MD Taking Active   pantoprazole (PROTONIX) 40 MG tablet [id number] Yes Take 40 mg by mouth daily. [provider] Taking Active   polyethylene glycol powder (GLYCOLAX/MIRALAX) 17 GM/SCOOP powder [id number] Yes Take 17 g by mouth daily.  Patient taking differently: Take 17 g by mouth daily as needed for mild constipation or moderate constipation.   Ardith Dark, MD Taking Active   potassium chloride SA (KLOR-CON M) 20 MEQ tablet [id number] Yes Take 1 tablet (20 mEq total) by mouth daily. Ardith Dark, MD Taking Active   senna-docusate (SENOKOT-S) 8.6-50 MG tablet [id number] Yes Take 1 tablet by mouth at bedtime as needed for mild constipation. Drema Dallas, MD Taking Active             Home Care and Equipment/Supplies: Were Home Health Services Ordered?: No Any new equipment or medical supplies ordered?: No  Functional Questionnaire: Do you need assistance with bathing/showering or dressing?: No Do you need assistance with meal preparation?: No Do you need assistance with eating?: No Do you have difficulty maintaining continence: No Do you need assistance with getting out of bed/getting out of a chair/moving?: No Do you have difficulty managing or taking your medications?: No  Follow up appointments reviewed: PCP Follow-up appointment confirmed?: Yes Date of PCP follow-up appointment?: 09/25/22 Follow-up Provider: Jacquiline Doe MD   @ 1120 am Specialist Hospital Follow-up appointment confirmed?: No Reason Specialist Follow-Up Not Confirmed:  (pt states she will only be following up with primary care provider for sinus infection) Do you need transportation to your follow-up appointment?: No Do you understand care options if your condition(s) worsen?: Yes-patient verbalized understanding  SDOH Interventions Today    Flowsheet Row Most Recent Value  SDOH  Interventions   Food Insecurity Interventions Intervention Not Indicated  Transportation Interventions Intervention Not Indicated       Irving Shows [place], BSN [facility]/ Ambulatory Care Management [phone]

## 2022-08-31 NOTE — Telephone Encounter (Signed)
Ok to send in new prescription for 25 mg daily.  [name]. [name], MD 08/31/2022 11:47 AM

## 2022-08-31 NOTE — Telephone Encounter (Signed)
Patient notified new prescription send to pharmacy

## 2022-09-03 ENCOUNTER — Telehealth: Payer: Self-pay | Admitting: Family Medicine

## 2022-09-03 ENCOUNTER — Other Ambulatory Visit: Payer: Self-pay | Admitting: Family Medicine

## 2022-09-03 ENCOUNTER — Ambulatory Visit: Payer: 59 | Admitting: Family Medicine

## 2022-09-03 ENCOUNTER — Other Ambulatory Visit: Payer: 59 | Admitting: Pharmacist

## 2022-09-03 DIAGNOSIS — M170 Bilateral primary osteoarthritis of knee: Secondary | ICD-10-CM

## 2022-09-03 NOTE — Telephone Encounter (Signed)
Prescription Request  09/03/2022  LOV: 08/25/2022  What is the name of the medication or equipment? HYDROcodone-acetaminophen (NORCO/VICODIN) 5-325 MG tablet   Have you contacted your pharmacy to request a refill? Yes   Which pharmacy would you like this sent to?  [facility] #[id number] - [place], [place] - [address] AT [address] [place] [address] Phone: [phone] Fax: [phone]    Patient notified that their request is being sent to the clinical staff for review and that they should receive a response within 2 business days.   Please advise at Mobile [phone] (mobile)

## 2022-09-03 NOTE — Progress Notes (Signed)
09/03/2022 Name: Casey [name] MRN: [id number] DOB: 05/07/1935  Chief Complaint  Patient presents with   Medication Management    Follow up    Casey [name] is a 87 y.o. year old female who presented for a telephone visit.   They were referred to the pharmacist by their PCP for assistance in managing diabetes, hypertension, hyperlipidemia, and complex medication management.    Subjective:  Care Team: Primary Care Provider: Ardith Dark, MD ; Next Scheduled Visit: 09/25/2022 Has been referred to psychiatry - referral pending  Medication Access/Adherence Patient had been noted recently to have been starting and stopping medications on her own. At our previous visit we reviewed medications, indications and I mailed her a list of all her medications with indications and recommended dosing schedule.  Since our last visit patient has had a few changes to her maintenance medications. Escitalopram stopped and fluoxetine 20mg  was started.  Gabapentin dose increased to 300mg  3 times a day Jardiance dose was increased to 25mg  daily.   Patient reports affordability concerns with their medications: No  Patient reports access/transportation concerns to their pharmacy: No  Patient reports adherence concerns with their medications:  No    Barriers to Adherence:  Multiple comorbidities Complex medication regimen Poor knowledge of the illness and medication. Independent pausing, stopping or controlling of the medication. Low understanding of medications benefits.   Diabetes:  Current medications:  Jardiance 25mg  daily  Medications tried in the past: metformin - stopped 2023 due to improved blood glucose per patient but due to recent AKI with hospitalization, would avoid metformin.  Using Accu-Chek Guide meter; testing 3 times daily Current glucose readings:  157 this morning which she reports has improved form the 200's she was getting prior to increasing dose of Jardiance last week.   Denies blood glucose < 80   Patient denies hypoglycemic s/sx including no dizziness, shakiness, sweating. Patient denies hyperglycemic symptoms including no polyuria, polydipsia, polyphagia, nocturia, neuropathy, blurred vision.  Current physical activity: walks when it is cool - 30 minutes    Hypertension: Current medications: amlodipine 2.5mg  - take 2 tablets daily  Medications previously tried: lisinopril stopped due to AKI  Patient does not have a validated, automated, upper arm home BP cuff Current blood pressure readings readings: none   BP Readings from Last 3 Encounters:  08/29/22 135/89  08/25/22 136/77  08/05/22 (!) 163/74    Patient reports hypotensive s/sx including: occasional dizziness which she states is related to allergies / vertigo.  Patient denies hypertensive symptoms including no headaches (occasional sinus headaches)  chest pain, shortness of breath   Hyperlipidemia/ASCVD Risk Reduction  Current lipid lowering medications: atorvastatin 10mg  daily   Antiplatelet regimen: aspirin 81mg  daily    Dementia / Anxiety:  Current medications: donepezil 10mg  daily at bedtime Buspirone 10mg  - recent increase to take 2 tabs = 20mg  twice a day Fluoxetine 20mg  daily   Medications tried in the past: escitalopram - stopped 03/2022 and again 08/2022, mirtazapine 7.5mg  - stopped 03/2022, trazodone - took 2021 thru 05/29/2021, Seroquel - various doses - stopped in 2024  It has been difficult over the last few months to know exactly which medications have helped [name] regarding anxiety. She has started and stopped several on her own.    Behavioral Health support: none current - psychiatry referral pending   Objective:  Lab Results  Component Value Date   HGBA1C 8.0 (H) 05/31/2022    Lab Results  Component Value Date   CREATININE  1.02 (H) 08/29/2022   BUN 13 08/29/2022   NA 137 08/29/2022   K 3.9 08/29/2022   CL 102 08/29/2022   CO2 26 08/29/2022     Lab Results  Component Value Date   CHOL 172 05/31/2022   HDL 44 05/31/2022   LDLCALC 111 (H) 05/31/2022   TRIG 83 05/31/2022   CHOLHDL 3.9 05/31/2022    Medications Reviewed Today   Medications were not reviewed in this encounter       Assessment/Plan:   Diabetes: Currently home blood glucose improving but last A1c was not at goal of < 7.0% - Reviewed goal A1c, goal fasting, and goal 2 hour post prandial glucose.  - Reviewed lifestyle modifications including:increase frequency of walking to 5 days per week - goal is to get 150 minutes of exercise per week.  - Recommend to continue Jardiance 25mg  daily. Would use metformin with caution due to history of AKI this year. Also has had poor p.o intake in last year so GLP1s might not be best next choice.  - Recommend to check glucose 2 to 3 times a day   Hypertension:Currently controlled per last 2 OV readings - Reviewed long term cardiovascular and renal outcomes of uncontrolled blood pressure - Recommended she purchase automated blood pressure cuff. She has monthly over-the-counter benefits with her duel enrollment [facility] plan. Recommended to check home blood pressure and heart rate 2 to 3 times per week and record.  - Recommend to continue amlodipine 2.5mg  - take 2 tablets daily (plan to change to 5mg  daily in future if continues to tolerate   Hyperlipidemia/ASCVD Risk Reduction: Currently uncontrolled but recently restarted atorvastatin- last LDL was not at goal of < 70 - Reviewed long term complications of uncontrolled cholesterol - Continue atorvastatin 10mg  daily  Depression/Anxiety:Currently uncontrolled - Continue buspirone 20mg  twice a day  - Continue fluoxetine 20mg  daily  - follow  up with Dr [name] as planned in September to reassess.   Medication Management: Currently strategy has shown to have improved adherence to prescribed medication regimen - Discuss importance of taking medications as  prescribe.  - Suggested use of weekly pill box to organize medications. Patient is also considering adherence packaging.  - Updated med list provided to patient at last clinical pharmacist visit to include recent changes.  Med Chart / list will include: medication, indication, and administration times - Will try to meet patient in office in September for review of medications and education.   Follow Up Plan: 09/24/2022  Henrene Pastor, PharmD Clinical Pharmacist [facility]  Population Health

## 2022-09-07 ENCOUNTER — Other Ambulatory Visit: Payer: Self-pay | Admitting: *Deleted

## 2022-09-07 ENCOUNTER — Telehealth: Payer: Self-pay | Admitting: Family Medicine

## 2022-09-07 DIAGNOSIS — J301 Allergic rhinitis due to pollen: Secondary | ICD-10-CM

## 2022-09-07 MED ORDER — HYDROCODONE-ACETAMINOPHEN 5-325 MG PO TABS
1.0000 | ORAL_TABLET | Freq: Three times a day (TID) | ORAL | 0 refills | Status: DC | PRN
Start: 2022-09-07 — End: 2022-10-07

## 2022-09-07 MED ORDER — AZELASTINE HCL 0.1 % NA SOLN
NASAL | 12 refills | Status: DC
Start: 2022-09-07 — End: 2023-10-26

## 2022-09-07 NOTE — Telephone Encounter (Signed)
Prescription Request  09/07/2022  LOV: 08/25/2022  What is the name of the medication or equipment? azelastine (ASTELIN) 0.1 % nasal spray   Have you contacted your pharmacy to request a refill? Yes   Which pharmacy would you like this sent to?  [facility] #[id number] - [place],  - [address] AT [address] [place] [address] Phone: [phone] Fax: [phone]     Patient notified that their request is being sent to the clinical staff for review and that they should receive a response within 2 business days.   Please advise at Mobile [phone] (mobile)

## 2022-09-07 NOTE — Telephone Encounter (Signed)
Rx send to pharmacy  

## 2022-09-10 ENCOUNTER — Other Ambulatory Visit: Payer: 59 | Admitting: Pharmacist

## 2022-09-14 ENCOUNTER — Other Ambulatory Visit: Payer: Self-pay | Admitting: Family Medicine

## 2022-09-22 ENCOUNTER — Telehealth: Payer: Self-pay | Admitting: *Deleted

## 2022-09-22 ENCOUNTER — Other Ambulatory Visit: Payer: 59 | Admitting: *Deleted

## 2022-09-22 NOTE — Patient Outreach (Signed)
  Care Management   Follow Up Note   09/22/2022 Name: EULALA ISHAQ MRN: [id number] DOB: Oct 12, 1935   Referred by: Ardith Dark, MD Reason for referral : Care Coordination   An unsuccessful telephone outreach was attempted today. The patient was referred to the case management team for assistance with care management and care coordination.   Follow Up Plan: Telephone follow up appointment with care management team member scheduled for: upon care guide rescheduling.  Irving Shows [facility], [facility] [place]/ Ambulatory Care Management [phone]

## 2022-09-25 ENCOUNTER — Encounter: Payer: Self-pay | Admitting: Family Medicine

## 2022-09-25 ENCOUNTER — Ambulatory Visit (INDEPENDENT_AMBULATORY_CARE_PROVIDER_SITE_OTHER): Payer: 59 | Admitting: Family Medicine

## 2022-09-25 ENCOUNTER — Other Ambulatory Visit: Payer: 59 | Admitting: Pharmacist

## 2022-09-25 VITALS — BP 130/73 | HR 51 | Temp 97.8°F | Ht 62.0 in | Wt 165.0 lb

## 2022-09-25 DIAGNOSIS — R809 Proteinuria, unspecified: Secondary | ICD-10-CM | POA: Diagnosis not present

## 2022-09-25 DIAGNOSIS — I152 Hypertension secondary to endocrine disorders: Secondary | ICD-10-CM

## 2022-09-25 DIAGNOSIS — F411 Generalized anxiety disorder: Secondary | ICD-10-CM

## 2022-09-25 DIAGNOSIS — Z91148 Patient's other noncompliance with medication regimen for other reason: Secondary | ICD-10-CM

## 2022-09-25 DIAGNOSIS — E1129 Type 2 diabetes mellitus with other diabetic kidney complication: Secondary | ICD-10-CM

## 2022-09-25 DIAGNOSIS — F321 Major depressive disorder, single episode, moderate: Secondary | ICD-10-CM

## 2022-09-25 DIAGNOSIS — E1159 Type 2 diabetes mellitus with other circulatory complications: Secondary | ICD-10-CM

## 2022-09-25 LAB — POCT GLYCOSYLATED HEMOGLOBIN (HGB A1C): Hemoglobin A1C: 7.2 % — AB (ref 4.0–5.6)

## 2022-09-25 MED ORDER — BUSPIRONE HCL 10 MG PO TABS: 10.0000 mg | ORAL_TABLET | Freq: Two times a day (BID) | ORAL | 3 refills | Status: DC

## 2022-09-25 NOTE — Assessment & Plan Note (Signed)
Blood pressure at goal on amlodipine 5 mg daily.  No need for refill today.

## 2022-09-25 NOTE — Assessment & Plan Note (Signed)
I extensive discussion today with patient regarding medications.  She additionally met with our clinical pharmacist today to help assist with this 2.  Please see their note for today.  She is now back on Protonix and is no longer taking the omeprazole.  We will make this adjustment to her medication list.  Will be restarting Prozac 20 mg daily and BuSpar 10 mg twice daily as above.  She needs to have a psychiatry visit soon to help manage her anxiety and depression.  She will continue working with the pharmacy team to help with medication adherence.

## 2022-09-25 NOTE — Assessment & Plan Note (Signed)
Her depressive symptoms are stable though we are working on making sure that she is more adherent to prescribed medication regimen.  She is currently on Prozac 20 mg daily.  Will continue this for now.

## 2022-09-25 NOTE — Progress Notes (Signed)
Casey [name] is a 87 y.o. female who presents today for an office visit.  Assessment/Plan:  Chronic Problems Addressed Today: Type 2 diabetes mellitus with microalbuminuria, without long-term current use of insulin (HCC) A1c stable at 7.2. Continue jardiance 25 mg daily.  Recheck in 3 months.   Depression, major, single episode, moderate (HCC) Her depressive symptoms are stable though we are working on making sure that she is more adherent to prescribed medication regimen.  She is currently on Prozac 20 mg daily.  Will continue this for now.  GAD (generalized anxiety disorder) Symptoms are still not adequately controlled.  This is her primary concern today.  We had an extensive discussion today regarding her treatment plan and treatment options.  She does wish to go back on alprazolam however discussed with her that with being on hydrocodone on this represents a significant interaction and that we would be unable to go back on alprazolam at this point due to concern for potential side effects including respiratory depression.  It is unclear what medications that she has been taking since her last office visit.  We did prescribe Prozac 20 mg daily for 90-day supply at her last office visit however her pill bottle is empty today.  She has also been off of the BuSpar that we had prescribed.  She has been taking her gabapentin 300 mg 3 times daily but is not sure if it is effective.  We will go back to Prozac 20 mg daily.  Reiterated that it is important she take medications as prescribed and that they will take several weeks before medications become effective.  Will also restart BuSpar 10 mg twice daily.  We will continue her gabapentin 300 mg 3 times daily.  We have referred her to psychiatry multiple times however she has not been able to be seen by them.  Her most recent referral a couple of months ago was approved however no appointment has been scheduled yet.  Discussed that is very  important for her to follow-up with psychiatry to help better manage her anxiety.  We gave contact information that she can call today.  Hypertension associated with diabetes (HCC) Blood pressure at goal on amlodipine 5 mg daily.  No need for refill today.  Poor compliance with medication I extensive discussion today with patient regarding medications.  She additionally met with our clinical pharmacist today to help assist with this 2.  Please see their note for today.  She is now back on Protonix and is no longer taking the omeprazole.  We will make this adjustment to her medication list.  Will be restarting Prozac 20 mg daily and BuSpar 10 mg twice daily as above.  She needs to have a psychiatry visit soon to help manage her anxiety and depression.  She will continue working with the pharmacy team to help with medication adherence.     Subjective:  HPI:  See A/P for status of chronic conditions.  Patient is here today for 1 month follow-up.  We saw her last a month ago.  At that time she was having ongoing issues with his anxiety.  We have tried starting Prozac 20 mg daily and increase to gabapentin 300 mg 3 times daily.  She is not sure if these medications have been effective.  She thinks that she stopped taking the BuSpar but does not remember exactly.  She is still having a lot of anxiety and wishes to go back on alprazolam.  It was too early  to recheck her A1c at her last office visit.  She was continued on Jardiance.  Sugars at home and been in the low to mid 100s.       Objective:  Physical Exam: BP 130/73   Pulse (!) 51   Temp 97.8 F (36.6 C) (Temporal)   Ht 5\' 2"  (1.575 m)   Wt 165 lb (74.8 kg)   SpO2 97%   BMI 30.18 kg/m   Gen: No acute distress, resting comfortably CV: Regular rate and rhythm with no murmurs appreciated Pulm: Normal work of breathing, clear to auscultation bilaterally with no crackles, wheezes, or rhonchi Neuro: Grossly normal, moves all  extremities Psych: Normal affect and thought content  Time Spent: 40 minutes of total time was spent on the date of the encounter performing the following actions: chart review prior to seeing the patient, coordinating with the pharmacy team, obtaining history, performing a medically necessary exam, counseling on the treatment plan, placing orders, and documenting in our EHR.        [name]. [name], MD 09/25/2022 12:58 PM

## 2022-09-25 NOTE — Assessment & Plan Note (Signed)
Symptoms are still not adequately controlled.  This is her primary concern today.  We had an extensive discussion today regarding her treatment plan and treatment options.  She does wish to go back on alprazolam however discussed with her that with being on hydrocodone on this represents a significant interaction and that we would be unable to go back on alprazolam at this point due to concern for potential side effects including respiratory depression.  It is unclear what medications that she has been taking since her last office visit.  We did prescribe Prozac 20 mg daily for 90-day supply at her last office visit however her pill bottle is empty today.  She has also been off of the BuSpar that we had prescribed.  She has been taking her gabapentin 300 mg 3 times daily but is not sure if it is effective.  We will go back to Prozac 20 mg daily.  Reiterated that it is important she take medications as prescribed and that they will take several weeks before medications become effective.  Will also restart BuSpar 10 mg twice daily.  We will continue her gabapentin 300 mg 3 times daily.  We have referred her to psychiatry multiple times however she has not been able to be seen by them.  Her most recent referral a couple of months ago was approved however no appointment has been scheduled yet.  Discussed that is very important for her to follow-up with psychiatry to help better manage her anxiety.  We gave contact information that she can call today.

## 2022-09-25 NOTE — Patient Instructions (Addendum)
It was very nice to see you today!  Your sugar looks good today!  Please call to schedule appoint with your psychiatrist at [phone]  Please make sure that you are taking year BuSpar and fluoxetine as prescribed.  Return in about 3 months (around 12/25/2022).   Take care, Dr [name]  PLEASE NOTE:  If you had any lab tests, please let us know if you have not heard back within a few days. You may see your results on mychart before we have a chance to review them but we will give you a call once they are reviewed by [name].   If we ordered any referrals today, please let us know if you have not heard from their office within the next week.   If you had any urgent prescriptions sent in today, please check with the pharmacy within an hour of our visit to make sure the prescription was transmitted appropriately.   Please try these tips to maintain a healthy lifestyle:  Eat at least 3 REAL meals and 1-2 snacks per day.  Aim for no more than 5 hours between eating.  If you eat breakfast, please do so within one hour of getting up.   Each meal should contain half fruits/vegetables, one quarter protein, and one quarter carbs (no bigger than a computer mouse)  Cut down on sweet beverages. This includes juice, soda, and sweet tea.   Drink at least 1 glass of water with each meal and aim for at least 8 glasses per day  Exercise at least 150 minutes every week.

## 2022-09-25 NOTE — Assessment & Plan Note (Addendum)
A1c stable at 7.2. Continue jardiance 25 mg daily.  Recheck in 3 months.

## 2022-10-06 NOTE — Progress Notes (Signed)
09/25/2022 Name: Casey [name] MRN: [id number] DOB: [birth year]  No chief complaint on file.   Casey [name] is a 87 y.o. year old female who presented for a in person visit (patient also saw PCP today)    They were referred to the pharmacist by their PCP for assistance in managing diabetes, hypertension, hyperlipidemia, and complex medication management.    Subjective:  Care Team: Primary Care Provider: Ardith Dark, MD ; Next Scheduled Visit: 09/25/2022 Has been referred to psychiatry - referral pending  Medication Access/Adherence Patient had been starting and stopping medications on her own. She admits today that she has not been following prescribed medication regimen.  At our previous visit we reviewed medications, indications and I mailed her a list of all her medications with indications and recommended dosing schedule.   There are a few medication bottles that are empty but she should still have about 2 weeks of medications remaining.  She reports that she has sometimes taken 2 of her fluoxetine capsules  Patient reports affordability concerns with their medications: No  Patient reports access/transportation concerns to their pharmacy: No  Patient reports adherence concerns with their medications:  No    Barriers to Adherence:  Multiple comorbidities Complex medication regimen Poor knowledge of the illness and medication. Independent pausing, stopping or controlling of the medication. Low understanding of medications benefits.   Diabetes:  Current medications:  Jardiance 25mg  daily  Medications tried in the past: metformin - stopped 2023 due to improved blood glucose per patient but due to recent AKI with hospitalization, would avoid metformin.  Using Accu-Chek Guide meter; testing 3 times daily Current glucose readings:  no readings available this visit.  Denies blood glucose < 80   Patient denies hypoglycemic s/sx including no dizziness, shakiness,  sweating. Patient denies hyperglycemic symptoms including no polyuria, polydipsia, polyphagia, nocturia, neuropathy, blurred vision.  Current physical activity: walks when it is cool - 30 minutes    Hypertension: Current medications: amlodipine 2.5mg  - take 2 tablets daily  Medications previously tried: lisinopril stopped due to AKI  Patient does not have a validated, automated, upper arm home BP cuff Current blood pressure readings readings: none   BP Readings from Last 3 Encounters:  09/25/22 130/73  08/29/22 135/89  08/25/22 136/77    Patient reports hypotensive s/sx including: occasional dizziness which she states is related to allergies / vertigo.  Patient denies hypertensive symptoms including no headaches (occasional sinus headaches)  chest pain, shortness of breath   Hyperlipidemia/ASCVD Risk Reduction  Current lipid lowering medications: atorvastatin 10mg  daily   Antiplatelet regimen: aspirin 81mg  daily    Dementia / [name]:  Current medications: donepezil 10mg  daily at bedtime Buspirone 10mg  - recent increase to take 2 tabs = 20mg  twice a day (has not been taking regularly)  Fluoxetine 20mg  daily   Medications tried in the past: escitalopram - stopped 03/2022 and again 08/2022, mirtazapine 7.5mg  - stopped 03/2022, trazodone - took 2021 thru 05/29/2021, Seroquel - various doses - stopped in 2024  It has been difficult over the last few months to know exactly which medications have helped Casey [name]. She has started and stopped several on her own.    Behavioral Health support: none current - psychiatry referral pending   Objective:  Lab Results  Component Value Date   HGBA1C 7.2 (A) 09/25/2022    Lab Results  Component Value Date   CREATININE 1.02 (H) 08/29/2022   BUN 13 08/29/2022   NA 137 08/29/2022  K 3.9 08/29/2022   CL 102 08/29/2022   CO2 26 08/29/2022    Lab Results  Component Value Date   CHOL 172 05/31/2022   HDL  44 05/31/2022   LDLCALC 111 (H) 05/31/2022   TRIG 83 05/31/2022   CHOLHDL 3.9 05/31/2022    Medications Reviewed Today   Medications were not reviewed in this encounter       Assessment/Plan:   Diabetes: Currently home blood glucose improving but last A1c was not at goal of < 7.0% - Recommend to continue Jardiance 25mg  daily.  Thoughts regarding other antidiabetic treatement if needed in future - would use metformin with caution due to history of AKI this year. Also has had poor p.o intake in last year so GLP1s might not be best next choice.  - Recommend to check glucose 2 to 3 times a day   Hypertension:Currently controlled per lastOV readings - Reviewed long term cardiovascular and renal outcomes of uncontrolled blood pressure - Recommended she purchase automated blood pressure cuff. She has monthly over-the-counter benefits with her duel enrollment [facility] plan. Recommended to check home blood pressure and heart rate 2 to 3 times per week and record.  - Recommend to continue amlodipine 2.5mg  - take 2 tablets daily (plan to change to 5mg  daily in future if continues to tolerate  Hyperlipidemia/ASCVD Risk Reduction: Currently uncontrolled but recently restarted atorvastatin- last LDL was not at goal of < 70 - Reviewed long term complications of uncontrolled cholesterol - Continue atorvastatin 10mg  daily  Depression/[name]:Currently uncontrolled - Restart buspirone 10 to 20mg  twice a day - discussed that this was a safer option to benzodiazepine type medications - Continue fluoxetine 20mg  daily. Reminded to only take 1 capsule daily  Medication Management: Currently strategy has shown a little improvement to adherence to prescribed medication regimen but not ideal. patient is still stopping and starting medications and sometimes taking more than prescribed.  - Discuss importance of taking medications as prescribe.  - Suggested use of weekly pill box to organize  medications. Discussed adherence packaging with patient again and showed her a sample of what adherence packaging / box would look like. She is considering.   - Updated med list provided to patient at last clinical pharmacist visit to include recent changes.  Med Chart / list will include: medication, indication, and administration times  Follow Up Plan: 2 weeks.   Henrene Pastor, PharmD Clinical Pharmacist [facility]  [facility]

## 2022-10-07 ENCOUNTER — Telehealth: Payer: Self-pay | Admitting: Family Medicine

## 2022-10-07 DIAGNOSIS — M170 Bilateral primary osteoarthritis of knee: Secondary | ICD-10-CM

## 2022-10-07 NOTE — Telephone Encounter (Signed)
Prescription Request  10/07/2022  LOV: 09/25/2022  What is the name of the medication or equipment?  HYDROcodone-acetaminophen (NORCO/VICODIN) 5-325 MG tablet   Have you contacted your pharmacy to request a refill? Yes   Which pharmacy would you like this sent to?  [id number] - [place], [place] - [address] AT [address] [place] [address] Phone: [phone] Fax: [phone]   Patient notified that their request is being sent to the clinical staff for review and that they should receive a response within 2 business days.   Please advise at Mobile [phone] (mobile)

## 2022-10-08 MED ORDER — HYDROCODONE-ACETAMINOPHEN 5-325 MG PO TABS
1.0000 | ORAL_TABLET | Freq: Three times a day (TID) | ORAL | 0 refills | Status: DC | PRN
Start: 2022-10-08 — End: 2022-11-04

## 2022-10-09 ENCOUNTER — Other Ambulatory Visit: Payer: 59 | Admitting: Pharmacist

## 2022-10-09 NOTE — Progress Notes (Signed)
09/25/2022 Name: Casey [name] MRN: [id number] DOB: 1935-07-05  Chief Complaint  Patient presents with   Medication Management    Casey [name] is a 87 y.o. year old female who presented for a in person visit (patient also saw PCP today)    They were referred to the pharmacist by their PCP for assistance in managing diabetes, hypertension, hyperlipidemia, and complex medication management.    Subjective:  Care Team: Primary Care Provider: Ardith Dark, MD ; Next Scheduled Visit: 09/25/2022 Has been referred to psychiatry - referral pending  Medication Access/Adherence Patient had been starting and stopping medications on her own. She admits today that she has not been following prescribed medication regimen.  At our previous visit we reviewed medications, indications and I mailed her a list of all her medications with indications and recommended dosing schedule.   There are a few medication bottles that are empty but she should still have about 2 weeks of medications remaining.  She reports that she has sometimes taken 2 of her fluoxetine capsules  Patient reports affordability concerns with their medications: No  Patient reports access/transportation concerns to their pharmacy: No  Patient reports adherence concerns with their medications:  No    Barriers to Adherence:  Multiple comorbidities Complex medication regimen Poor knowledge of the illness and medication. Independent pausing, stopping or controlling of the medication. Low understanding of medications benefits.   Diabetes:  Current medications:  Jardiance 25mg  daily  (sometimes she is taking 2 tablets when her blood glucose is elevated)   Medications tried in the past: metformin - stopped 2023 due to improved blood glucose per patient but due to recent AKI with hospitalization, would avoid metformin.  Using Accu-Chek Guide meter; testing 3 times daily Current glucose readings:  188 this morning; 190  yesterday morning Denies blood glucose < 80   Patient denies hypoglycemic s/sx including no dizziness, shakiness, sweating. Patient denies hyperglycemic symptoms including no polyuria, polydipsia, polyphagia, nocturia, neuropathy, blurred vision.  Current physical activity: walks when it is cool - 30 minutes    Hypertension: Current medications: amlodipine 2.5mg  - take 2 tablets daily  Medications previously tried: lisinopril stopped due to AKI  Patient does have a validated, automated, upper arm home BP cuff Current blood pressure readings readings: states her blood pressure has been good but did not have any readings written down.    BP Readings from Last 3 Encounters:  09/25/22 130/73  08/29/22 135/89  08/25/22 136/77    Patient reports hypotensive s/sx including: occasional dizziness which she states is related to allergies / vertigo.  Patient denies hypertensive symptoms including no headaches (occasional sinus headaches)  chest pain, shortness of breath   Hyperlipidemia/ASCVD Risk Reduction  Current lipid lowering medications: atorvastatin 10mg  daily   Antiplatelet regimen: aspirin 81mg  daily    Dementia / Anxiety:  Current medications: donepezil 10mg  daily at bedtime (not taking because she ran out too early) Buspirone 10mg  - recent increase to take 2 tabs = 20mg  twice a day   Fluoxetine 20mg  daily - she is out of this medication because she took 2 per day.   Medications tried in the past: escitalopram - stopped 03/2022 and again 08/2022, mirtazapine 7.5mg  - stopped 03/2022, trazodone - took 2021 thru 05/29/2021, Seroquel - various doses - stopped in 2024  It has been difficult over the last few months to know exactly which medications have helped Casey [name] regarding anxiety. She has started and stopped several on her own.  She states that she has restart buspirone but does not feel that is helping  Behavioral Health support: Per patient she has an appointment  to see psychiatrist 10/27/2022 but she is not sure of the address for the appointment (phone [phone] or [phone])   Objective:  Lab Results  Component Value Date   HGBA1C 7.2 (A) 09/25/2022    Lab Results  Component Value Date   CREATININE 1.02 (H) 08/29/2022   BUN 13 08/29/2022   NA 137 08/29/2022   K 3.9 08/29/2022   CL 102 08/29/2022   CO2 26 08/29/2022    Lab Results  Component Value Date   CHOL 172 05/31/2022   HDL 44 05/31/2022   LDLCALC 111 (H) 05/31/2022   TRIG 83 05/31/2022   CHOLHDL 3.9 05/31/2022    Medications Reviewed Today   Medications were not reviewed in this encounter       Assessment/Plan:   Diabetes: Last A1c had improved from 8.0 to 7.2%.  Home blood glucose over the last 2 days has been > 180.  - Recommend to continue Jardiance 25mg  daily.  I would like to see a few more home readings before adding more medcations. Could consider adding Tradjenta or Januvia  - Recommend to check glucose daily - will check back in 2 to 4 weeks.   - Reviewed home blood glucose readings and reviewed goals  Fasting blood glucose goal (before meals) = 80 to 130 Blood glucose goal after a meal = less than 180     Hypertension:Currently controlled  - Reviewed long term cardiovascular and renal outcomes of uncontrolled blood pressure - Continue to check blood pressure 2 to 3 times per week.  - Recommend to continue amlodipine 2.5mg  - take 2 tablets daily (plan to change to 5mg  daily in future if continues to tolerate  Hyperlipidemia/ASCVD Risk Reduction: Currently uncontrolled but recently restarted atorvastatin- last LDL was not at goal of < 70 - Reviewed long term complications of uncontrolled cholesterol - Continue atorvastatin 10mg  daily - Patient needs updated Rx per her pharmacy.   Depression/Anxiety:Currently uncontrolled - Continue buspirone 10 to 20mg  twice a day - discussed that this was a safer option to benzodiazepine type medications -  Continue fluoxetine 20mg  daily. Reminded to only take 1 capsule daily - Checked on refills for donepezil - new Rx needed per [facility] - will request from PCP.  - Tried to call Behavioral Health out patient at [phone] but check address of patient's appointment 10/27/2022 but they close at 1pm on Fridays. Patient will call them back on Monday to confirm address.   Medication Management: Current strategy has shown a little improvement to adherence to prescribed medication regimen. - Discuss importance of taking medications as prescribe.  - Suggested use of weekly pill box to organize medications.      Follow Up Plan: 2 to 4 weeks.   Henrene Pastor, PharmD Clinical Pharmacist [facility]  [facility]

## 2022-10-14 ENCOUNTER — Other Ambulatory Visit: Payer: Self-pay | Admitting: *Deleted

## 2022-10-14 NOTE — Telephone Encounter (Signed)
Pharmacy requesting Rx Atorvastatin refill Last refill done by historical provider

## 2022-10-15 MED ORDER — ATORVASTATIN CALCIUM 10 MG PO TABS: 10.0000 mg | ORAL_TABLET | Freq: Every day | ORAL | 3 refills | Status: AC

## 2022-10-16 ENCOUNTER — Other Ambulatory Visit: Payer: Self-pay | Admitting: Family Medicine

## 2022-10-19 ENCOUNTER — Encounter: Payer: Self-pay | Admitting: *Deleted

## 2022-10-19 ENCOUNTER — Other Ambulatory Visit: Payer: 59 | Admitting: *Deleted

## 2022-10-19 NOTE — Patient Instructions (Signed)
Visit Information  Thank you for taking time to visit with me today. Please don't hesitate to contact me if I can be of assistance to you before our next scheduled telephone appointment.  Following are the goals we discussed today:   Goals Addressed             This Visit's Progress    CCM (DIABETES) EXPECTED OUTCOME: MONITOR, SELF-MANAGE AND REDUCE SYMPTOMS OF DIABETES       Current Barriers:  Knowledge Deficits related to Diabetes management Chronic Disease Management support and education needs related to Diabetes, diet Cognitive Deficits No Advanced Directives in place- pt requests documents be mailed Patient reports she lives alone, is independent with all aspects of her care, does not drive, brother and first cousin provide transportation Patient reports she checks CBG TID with fasting readings 100's range with recent readings 180 and 200, pt pleased AIC decreased to 7.2 on 09/25/22 (down from 8.0) Patient reports she has chronic anxiety, nervousness, no depression, is on medication for anxiety, states she is to see psychiatrist on 10/27/22 (not sure name of psychiatrist per pt)  pt feels seeing psychiatrist in person will help her the most, does not feel telephonic management by social worker would be as effective for her  Pharmacist is currently working with pt for medication adherence Patient denies headache or other pain today  Planned Interventions: Provided education to patient about basic DM disease process; Reviewed prescribed diet with patient carbohydrate modified; Counseled on importance of regular laboratory monitoring as prescribed;        Discussed plans with patient for ongoing care management follow up and provided patient with direct contact information for care management team;      Advised patient, providing education and rationale, to check cbg three times daily and record        call provider for findings outside established parameters;       Review of patient  status, including review of consultants reports, relevant laboratory and other test results, and medications completed;       Pain assessment completed  Symptom Management: Take medications as prescribed   Attend all scheduled provider appointments Call pharmacy for medication refills 3-7 days in advance of running out of medications Attend church or other social activities Perform all self care activities independently  Call provider office for new concerns or questions  check blood sugar at prescribed times: three times daily check feet daily for cuts, sores or redness enter blood sugar readings and medication or insulin into daily log take the blood sugar log to all doctor visits take the blood sugar meter to all doctor visits trim toenails straight across fill half of plate with vegetables limit fast food meals to no more than 1 per week manage portion size read food labels for fat, fiber, carbohydrates and portion size set a realistic goal Advanced directives previously mailed- please look over and complete Psychiatry - please see as per scheduled appointment  Follow Up Plan: Telephone follow up appointment with care management team member scheduled for:  12/16/22 at 130 pm       CCM (HYPERTENSION) EXPECTED OUTCOME: SELF-MANAGE AND REDUCE SYMPTOMS OF HYPERTENSION       Current Barriers:  Knowledge Deficits related to Hypertension management Care Coordination needs related to medication management in a patient with Hypertension Chronic Disease Management support and education needs related to Hypertension, diet Patient reports she has blood pressure cuff but does not monitor blood pressure, pt exercises some throughout the  week, likes to walk No new concerns reported  Planned Interventions: Evaluation of current treatment plan related to hypertension self management and patient's adherence to plan as established by provider;   Reviewed medications with patient and discussed  importance of compliance;  Counseled on the importance of exercise goals with target of 150 minutes per week Discussed plans with patient for ongoing care management follow up and provided patient with direct contact information for care management team; Advised patient, providing education and rationale, to monitor blood pressure daily and record, calling PCP for findings outside established parameters;  Discussed complications of poorly controlled blood pressure such as heart disease, stroke, circulatory complications, vision complications, kidney impairment, sexual dysfunction;  Reinforced low sodium diet  Symptom Management: Take medications as prescribed   Attend all scheduled provider appointments Call pharmacy for medication refills 3-7 days in advance of running out of medications Attend church or other social activities Perform all self care activities independently  Call provider office for new concerns or questions  call the Suicide and Crisis Lifeline: 988 call the [place] National Suicide Prevention Lifeline: 360-848-1141 or TTY: 770-608-8001 TTY (515) 717-5102) to talk to a trained counselor call 1-800-273-TALK (toll free, 24 hour hotline) go to [facility] [address] [place] [phone]) call 911 if experiencing a Mental Health or Behavioral Health Crisis  check blood pressure 3 times per week choose a place to take my blood pressure (home, clinic or office, retail store) write blood pressure results in a log or diary learn about high blood pressure keep a blood pressure log take blood pressure log to all doctor appointments develop an action plan for high blood pressure take medications for blood pressure exactly as prescribed report new symptoms to your doctor eat more whole grains, fruits and vegetables, lean meats and healthy fats Follow low sodium diet- read food labels for sodium content  Follow Up Plan: Telephone follow  up appointment with care management team member scheduled for:  12/16/22 at 130 pm           Our next appointment is by telephone on 12/16/22 at 130 pm  Please call the care guide team at [phone] if you need to cancel or reschedule your appointment.   If you are experiencing a Mental Health or Behavioral Health Crisis or need someone to talk to, please call the Suicide and Crisis Lifeline: 988 call the [place] National Suicide Prevention Lifeline: 423-218-5410 or TTY: (220)354-3463 TTY 815-706-7465) to talk to a trained counselor call 1-800-273-TALK (toll free, 24 hour hotline) go to [facility] [address] [place] [phone]) call the [facility] Line: [phone] call 911   The patient verbalized understanding of instructions, educational materials, and care plan provided today and DECLINED offer to receive copy of patient instructions, educational materials, and care plan.   Telephone follow up appointment with care management team member scheduled for: 12/16/22 at 130 pm  Irving Shows [facility], BSN [place]/ Ambulatory Care Management [phone]

## 2022-10-19 NOTE — Patient Outreach (Signed)
Care Management   Visit Note  10/19/2022 Name: Casey [name] MRN: [id number] DOB: Mar 30, 1935  Subjective: Casey [name] is a 87 y.o. year old female who is a primary care patient of Ardith Dark, MD. The Care Management team was consulted for assistance.      Engaged with patient spoke with patient by telephone for follow up   Goals Addressed             This Visit's Progress    CCM (DIABETES) EXPECTED OUTCOME: MONITOR, SELF-MANAGE AND REDUCE SYMPTOMS OF DIABETES       Current Barriers:  Knowledge Deficits related to Diabetes management Chronic Disease Management support and education needs related to Diabetes, diet Cognitive Deficits No Advanced Directives in place- pt requests documents be mailed Patient reports she lives alone, is independent with all aspects of her care, does not drive, brother and first cousin provide transportation Patient reports she checks CBG TID with fasting readings 100's range with recent readings 180 and 200, pt pleased AIC decreased to 7.2 on 09/25/22 (down from 8.0) Patient reports she has chronic anxiety, nervousness, no depression, is on medication for anxiety, states she is to see psychiatrist on 10/27/22 (not sure name of psychiatrist per pt)  pt feels seeing psychiatrist in person will help her the most, does not feel telephonic management by social worker would be as effective for her  Pharmacist is currently working with pt for medication adherence Patient denies headache or other pain today  Planned Interventions: Provided education to patient about basic DM disease process; Reviewed prescribed diet with patient carbohydrate modified; Counseled on importance of regular laboratory monitoring as prescribed;        Discussed plans with patient for ongoing care management follow up and provided patient with direct contact information for care management team;      Advised patient, providing education and rationale, to check cbg three times  daily and record        call provider for findings outside established parameters;       Review of patient status, including review of consultants reports, relevant laboratory and other test results, and medications completed;       Pain assessment completed  Symptom Management: Take medications as prescribed   Attend all scheduled provider appointments Call pharmacy for medication refills 3-7 days in advance of running out of medications Attend church or other social activities Perform all self care activities independently  Call provider office for new concerns or questions  check blood sugar at prescribed times: three times daily check feet daily for cuts, sores or redness enter blood sugar readings and medication or insulin into daily log take the blood sugar log to all doctor visits take the blood sugar meter to all doctor visits trim toenails straight across fill half of plate with vegetables limit fast food meals to no more than 1 per week manage portion size read food labels for fat, fiber, carbohydrates and portion size set a realistic goal Advanced directives previously mailed- please look over and complete Psychiatry - please see as per scheduled appointment  Follow Up Plan: Telephone follow up appointment with care management team member scheduled for:  12/16/22 at 130 pm       CCM (HYPERTENSION) EXPECTED OUTCOME: SELF-MANAGE AND REDUCE SYMPTOMS OF HYPERTENSION       Current Barriers:  Knowledge Deficits related to Hypertension management Care Coordination needs related to medication management in a patient with Hypertension Chronic Disease Management support and education needs  related to Hypertension, diet Patient reports she has blood pressure cuff but does not monitor blood pressure, pt exercises some throughout the week, likes to walk No new concerns reported  Planned Interventions: Evaluation of current treatment plan related to hypertension self management and  patient's adherence to plan as established by provider;   Reviewed medications with patient and discussed importance of compliance;  Counseled on the importance of exercise goals with target of 150 minutes per week Discussed plans with patient for ongoing care management follow up and provided patient with direct contact information for care management team; Advised patient, providing education and rationale, to monitor blood pressure daily and record, calling PCP for findings outside established parameters;  Discussed complications of poorly controlled blood pressure such as heart disease, stroke, circulatory complications, vision complications, kidney impairment, sexual dysfunction;  Reinforced low sodium diet  Symptom Management: Take medications as prescribed   Attend all scheduled provider appointments Call pharmacy for medication refills 3-7 days in advance of running out of medications Attend church or other social activities Perform all self care activities independently  Call provider office for new concerns or questions  call the Suicide and Crisis Lifeline: 988 call the [place] National Suicide Prevention Lifeline: (412) 785-8185 or TTY: 571-033-3430 TTY (938)538-7589) to talk to a trained counselor call 1-800-273-TALK (toll free, 24 hour hotline) go to [facility] [address], [place] [phone]) call 911 if experiencing a Mental Health or Behavioral Health Crisis  check blood pressure 3 times per week choose a place to take my blood pressure (home, clinic or office, retail store) write blood pressure results in a log or diary learn about high blood pressure keep a blood pressure log take blood pressure log to all doctor appointments develop an action plan for high blood pressure take medications for blood pressure exactly as prescribed report new symptoms to your doctor eat more whole grains, fruits and vegetables, lean meats and  healthy fats Follow low sodium diet- read food labels for sodium content  Follow Up Plan: Telephone follow up appointment with care management team member scheduled for:  12/16/22 at 130 pm           Plan: Telephone follow up appointment with care management team member scheduled for: 12/16/22 at 130 pm  Irving Shows [facility], BSN [place]/ Ambulatory Care Management [phone]

## 2022-10-20 ENCOUNTER — Other Ambulatory Visit: Payer: Self-pay | Admitting: Family Medicine

## 2022-10-21 ENCOUNTER — Other Ambulatory Visit: Payer: Self-pay | Admitting: Pharmacist

## 2022-10-21 MED ORDER — DONEPEZIL HCL 10 MG PO TABS: 10.0000 mg | ORAL_TABLET | Freq: Every day | ORAL | 0 refills | Status: DC

## 2022-10-21 NOTE — Telephone Encounter (Signed)
Patient is requesting refill for donepezil 10mg  daily - LR was 07/20/2022 for 90 DS.  Forwarding to PCP for review and recommendations

## 2022-10-27 ENCOUNTER — Other Ambulatory Visit: Payer: Self-pay

## 2022-10-27 ENCOUNTER — Encounter (HOSPITAL_COMMUNITY): Payer: Self-pay | Admitting: Family

## 2022-10-27 ENCOUNTER — Ambulatory Visit (HOSPITAL_COMMUNITY): Payer: 59 | Admitting: Family

## 2022-10-27 VITALS — BP 142/84 | HR 88 | Ht 63.0 in | Wt 172.0 lb

## 2022-10-27 DIAGNOSIS — F321 Major depressive disorder, single episode, moderate: Secondary | ICD-10-CM

## 2022-10-27 DIAGNOSIS — F411 Generalized anxiety disorder: Secondary | ICD-10-CM

## 2022-10-27 MED ORDER — DULOXETINE HCL 20 MG PO CPEP: 20.0000 mg | ORAL_CAPSULE | Freq: Every day | ORAL | 1 refills | Status: DC

## 2022-10-27 MED ORDER — HYDROXYZINE HCL 10 MG PO TABS: 10.0000 mg | ORAL_TABLET | Freq: Two times a day (BID) | ORAL | 0 refills | Status: DC | PRN

## 2022-10-27 NOTE — Progress Notes (Addendum)
Psychiatric Initial Adult Assessment   Patient Identification: JOSSILYN BENDA MRN:  [id number] Date of Evaluation:  10/27/2022 Referral Source: Dr. Jacquiline Doe (primary care provider) Chief Complaint: Worsening depression and anxiety  Visit Diagnosis:    ICD-10-CM   1. Depression, major, single episode, moderate (HCC)  F32.1     2. GAD (generalized anxiety disorder)  F41.1       History of Present Illness:   Dvora Buitron 87 year old African-American female presents to establish care.  She reports a history of anxiety and depression.  States she has been prescribed multiple medications to help with her symptoms however nothing is helping.  States 20+ years ago she was taking alprazolam (xanax) and would like to be restarted on that medication.  Chart review patient has tried multiple medications to help with her depression and anxiety in the past however she is unable to recall the names of the medications. She reports a fairly good support system.  States she has a 56 year old son.  States often her niece comes to assist her with her daily ADLs.  As documented patient was tried on fluoxetine 20 mg, BuSpar 10 mg, citalopram 20 mg, trazodone and Seroquel.  Patient appears to be a poor historian related to medications.  However, does state that her medications is in a pillbox.   Mahiya reports her symptoms is increased depression and anxiety, mood irritability mood swings and poor concentration.  Reports feeling too nervous to drive. states she is on sure how long she has been struggling with the symptoms.  She denied previous inpatient admissions.  Denied that she is currently followed by therapy or psychiatry.  Denied illicit drug use or substance abuse history.  States she does go out with friends and is currently dating.  States I just need something to help relax anyone watching television.  PHQ-9 12 and GAD-7 18  Major depression disorder unspecified recurrent:  Generalized anxiety  disorder: Discussed discontinuing Fluoxetine 20 mg daily-( unsure if patient as been taken medications as directed) -Initiated Cymbalta 20 mg daily and hydroxyzine 10 mg p.o. twice a day hydroxyzine take 20 mg nightly -4-week follow-up  During evaluation [place] B Macaulay is sitting; she is alert/oriented x 3; calm/cooperative; and mood congruent with affect.  Patient is speaking in a clear tone at moderate volume, and normal pace; with good eye contact. Patient appears to have memory issues. Stated " I can't remember or I don't know."   Her thought process is coherent and relevant; There is no indication that she is currently responding to internal/external stimuli or experiencing delusional thought content.  Patient denies suicidal/self-harm/homicidal ideation, psychosis, and paranoia.  Patient has remained calm throughout assessment and has answered questions appropriately.    Associated Signs/Symptoms: Depression Symptoms:  depressed mood, difficulty concentrating, anxiety, (Hypo) Manic Symptoms:  Distractibility, Anxiety Symptoms:  Excessive Worry, Psychotic Symptoms:  Hallucinations: None PTSD Symptoms: NA  Past Psychiatric History:   Previous Psychotropic Medications: Yes   Substance Abuse History in the last 12 months:  No.  Consequences of Substance Abuse: NA  Past Medical History:  Past Medical History:  Diagnosis Date   ALLERGIC RHINITIS    Allergy    ANXIETY    ARM PAIN, RIGHT    BENIGN POSITIONAL VERTIGO    Carcinoma in situ of colon    CONSTIPATION    CYSTOCELE WITH INCOMPLETE UTERINE PROLAPSE    Diabetes mellitus without complication (HCC)    ESOPHAGEAL STRICTURE    FATIGUE    FLANK  PAIN, LEFT    Headache(784.0)    HIP PAIN, LEFT    HYPERCHOLESTEROLEMIA    HYPERGLYCEMIA    HYPERTENSION, BENIGN ESSENTIAL 03/03/2007   LEG EDEMA, BILATERAL    Microscopic hematuria    OBESITY    OSTEOARTHRITIS, KNEE    OSTEOPOROSIS    Peripheral neuropathy, idiopathic     Problems with swallowing and mastication    SINUSITIS, RECURRENT    SPRAIN&STRAIN OTH SPEC SITES SHOULDER&UPPER ARM     Past Surgical History:  Procedure Laterality Date   BIOPSY  02/06/2022   Procedure: BIOPSY;  Surgeon: Kathi Der, MD;  Location: [facility];  Service: Gastroenterology;;   COLONOSCOPY     ESOPHAGOGASTRODUODENOSCOPY (EGD) WITH PROPOFOL N/A 02/06/2022   Procedure: ESOPHAGOGASTRODUODENOSCOPY (EGD) WITH PROPOFOL;  Surgeon: Kathi Der, MD;  Location: [facility];  Service: Gastroenterology;  Laterality: N/A;   KNEE ARTHROPLASTY Right 07/24/2020   Procedure: COMPUTER ASSISTED TOTAL KNEE ARTHROPLASTY;  Surgeon: Samson Frederic, MD;  Location: [facility];  Service: Orthopedics;  Laterality: Right;   LEFT HEART CATHETERIZATION WITH CORONARY ANGIOGRAM N/A 12/02/2010   Procedure: LEFT HEART CATHETERIZATION WITH CORONARY ANGIOGRAM;  Surgeon: Peter M Swaziland, MD;  Location: [facility] CATH LAB;  Service: Cardiovascular;:: NORMAL CORONARY ARTERIES - NORMAL LV FUNCTION & EDP.     TRANSTHORACIC ECHOCARDIOGRAM  11/2010   Normal EF 60-65%. No RWMA.  Mild LAD dilation c/w Gr 1 DD.  -- PRETTY NORMAL ECHO    Family Psychiatric History:   Family History:  Family History  Problem Relation Age of Onset   Coronary artery disease Mother 54   Cancer Father 70   Heart disease Father    Colon cancer Other    Diabetes Other    Coronary artery disease Sister    Diabetes type II Sister    Cancer Sister    Coronary artery disease Brother    Kidney disease Brother    Coronary artery disease Brother    Breast cancer Neg Hx     Social History:   Social History   Socioeconomic History   Marital status: Single    Spouse name: Not on file   Number of children: 1   Years of education: 12   Highest education level: High school graduate  Occupational History   Not on file  Tobacco Use   Smoking status: Never   Smokeless tobacco: Never  Vaping Use   Vaping status: Never Used  Substance  and Sexual Activity   Alcohol use: No   Drug use: No   Sexual activity: Not Currently  Other Topics Concern   Not on file  Social History Narrative   She tries to stay active, but over the last year (20 19-20 20), she has been very limited by her right knee pain.   Is able to go grocery shopping.   Social Determinants of Health   Financial Resource Strain: Low Risk  (08/13/2022)   Overall Financial Resource Strain (CARDIA)    Difficulty of Paying Living Expenses: Not hard at all  Food Insecurity: No Food Insecurity (08/31/2022)   Hunger Vital Sign    Worried About Running Out of Food in the Last Year: Never true    Ran Out of Food in the Last Year: Never true  Transportation Needs: No Transportation Needs (08/31/2022)   PRAPARE - Administrator, Civil Service (Medical): No    Lack of Transportation (Non-Medical): No  Physical Activity: Insufficiently Active (08/13/2022)   Exercise Vital Sign    Days  of Exercise per Week: 3 days    Minutes of Exercise per Session: 30 min  Stress: Stress Concern Present (08/13/2022)   [employer] of Occupational Health - Occupational Stress Questionnaire    Feeling of Stress : Rather much  Social Connections: Moderately Isolated (08/13/2022)   Social Connection and Isolation Panel [NHANES]    Frequency of Communication with Friends and Family: More than three times a week    Frequency of Social Gatherings with Friends and Family: Twice a week    Attends Religious Services: 1 to 4 times per year    Active Member of [employer] or Organizations: No    Attends Banker Meetings: Never    Marital Status: Never married    Additional Social History:   Allergies:  No Known Allergies  Metabolic Disorder Labs: Lab Results  Component Value Date   HGBA1C 7.2 (A) 09/25/2022   MPG 182.9 05/31/2022   MPG 143 07/18/2020   No results found for: "PROLACTIN" Lab Results  Component Value Date   CHOL 172 05/31/2022   TRIG 83  05/31/2022   HDL 44 05/31/2022   CHOLHDL 3.9 05/31/2022   VLDL 17 05/31/2022   LDLCALC 111 (H) 05/31/2022   LDLCALC 102 (H) 08/19/2021   Lab Results  Component Value Date   TSH 1.20 08/19/2021    Therapeutic Level Labs: No results found for: "LITHIUM" No results found for: "CBMZ" No results found for: "VALPROATE"  Current Medications: Current Outpatient Medications  Medication Sig Dispense Refill   amLODipine (NORVASC) 2.5 MG tablet Take 2 tablets (5 mg total) by mouth daily. 180 tablet 3   aspirin 81 MG chewable tablet Chew 1 tablet (81 mg total) by mouth daily.     atorvastatin (LIPITOR) 10 MG tablet Take 1 tablet (10 mg total) by mouth daily. 90 tablet 3   azelastine (ASTELIN) 0.1 % nasal spray INSTILL 1 SPRAY INTO BOTH NOSTRILS TWICE DAILY 30 mL 12   azelastine (OPTIVAR) 0.05 % ophthalmic solution Place 1 drop into both eyes 2 (two) times daily.     Blood Glucose Monitoring Suppl DEVI 1 each by Does not apply route in the morning, at noon, and at bedtime. May substitute to any manufacturer covered by patient's insurance.Dx E11.29, 1 each 0   busPIRone (BUSPAR) 10 MG tablet Take 1 tablet (10 mg total) by mouth 2 (two) times daily. 180 tablet 3   Carboxymethylcellul-Glycerin (REFRESH OPTIVE) 1-0.9 % GEL Place 1 drop into both eyes daily.     donepezil (ARICEPT) 10 MG tablet Take 1 tablet (10 mg total) by mouth at bedtime. 90 tablet 0   DULoxetine (CYMBALTA) 20 MG capsule Take 1 capsule (20 mg total) by mouth daily. 30 capsule 1   empagliflozin (JARDIANCE) 25 MG TABS tablet Take 1 tablet (25 mg total) by mouth daily before breakfast. 90 tablet 1   FLUoxetine (PROZAC) 20 MG tablet Take 1 tablet (20 mg total) by mouth daily. 90 tablet 3   furosemide (LASIX) 40 MG tablet TAKE 1 TABLET(40 MG) BY MOUTH DAILY 90 tablet 0   gabapentin (NEURONTIN) 300 MG capsule Take 1 capsule (300 mg total) by mouth 3 (three) times daily. 90 capsule 3   glucose blood (ACCU-CHEK GUIDE) test strip USE AS  DIRECTED IN THE MORNING, AT NOON, AND AT BEDTIME 100 strip 4   HYDROcodone-acetaminophen (NORCO/VICODIN) 5-325 MG tablet Take 1 tablet by mouth every 8 (eight) hours as needed for moderate pain. For chronic pain 90 tablet 0   hydrOXYzine (  ATARAX) 10 MG tablet Take 1 tablet (10 mg total) by mouth 2 (two) times daily as needed for anxiety. May take 2 tablet ( 20 mg total) by mouth at night 60 tablet 0   meclizine (ANTIVERT) 25 MG tablet TAKE 1 TABLET(25 MG) BY MOUTH THREE TIMES DAILY AS NEEDED FOR DIZZINESS 30 tablet 0   Multiple Vitamins-Minerals (CERTAVITE/ANTIOXIDANTS) TABS Take 1 tablet by mouth at bedtime. 130 tablet 0   pantoprazole (PROTONIX) 40 MG tablet Take 40 mg by mouth daily.     polyethylene glycol powder (GLYCOLAX/MIRALAX) 17 GM/SCOOP powder Take 17 g by mouth daily. (Patient taking differently: Take 17 g by mouth daily as needed for mild constipation or moderate constipation.) 238 g 0   potassium chloride SA (KLOR-CON M) 20 MEQ tablet Take 1 tablet (20 mEq total) by mouth daily. 30 tablet 5   senna-docusate (SENOKOT-S) 8.6-50 MG tablet Take 1 tablet by mouth at bedtime as needed for mild constipation. 30 tablet 0   fluticasone (FLONASE) 50 MCG/ACT nasal spray Place 1 spray into both nostrils daily for 7 days. 9.9 mL 2   No current facility-administered medications for this visit.    Musculoskeletal: Strength & Muscle Tone: within normal limits Gait & Station: normal Patient leans: N/A  Psychiatric Specialty Exam: Review of Systems  Psychiatric/Behavioral:  Positive for decreased concentration. Negative for hallucinations and suicidal ideas. The patient is nervous/anxious.   All other systems reviewed and are negative.   Blood pressure (!) 142/84, pulse 88, height 5\' 3"  (1.6 m), weight 172 lb (78 kg).Body mass index is 30.47 kg/m.  General Appearance: Casual, noted to using cane for ambulation assistance, reported knee surgery   Eye Contact:  Good  Speech:  Clear and Coherent   Volume:  Normal  Mood:  Anxious and Depressed  Affect:  Congruent  Thought Process:  Coherent  Orientation:  Full (Time, Place, and Person)  Thought Content:  Logical  Suicidal Thoughts:  No  Homicidal Thoughts:  No  Memory:  Immediate;   Good Recent;   Good  Judgement:  Good  Insight:  Good  Psychomotor Activity:  Normal  Concentration:  Concentration: Good  Recall:  Good  Fund of Knowledge:Good  Language: Good  Akathisia:  No  Handed:  Right  AIMS (if indicated):  done  Assets:  Communication Skills Desire for Improvement Resilience Social Support  ADL's:  Intact  Cognition: WNL  Sleep:  Good   Screenings: GAD-7    Flowsheet Row Office Visit from 07/16/2022 in [facility] Office Visit from 01/23/2019 in Primary Care at [place] Office Visit from 09/08/2018 in [facility] Care at [facility] Visit from 08/09/2018 in Primary Care at [facility] Visit from 07/14/2018 in Primary Care at [facility]  Total GAD-7 Score 18 8 16 15 7       Mini-Mental    Flowsheet Row Office Visit from 03/07/2021 in [facility] Office Visit from 09/12/2020 in [facility] Office Visit from 01/03/2020 in [facility]  Total Score (max 30 points ) 23 22 22       PHQ2-9    Flowsheet Row Office Visit from 09/25/2022 in [facility] [facility] Visit from 08/25/2022 in [facility] from 08/13/2022 in [facility] from 07/16/2022 in [facility] from 06/25/2022 in [facility]  PHQ-2 Total Score 0 0 1 4 0  PHQ-9 Total  Score 0 0 -- 12 0      Flowsheet Row ED from 08/29/2022 in [facility] at [facility] ED from 06/08/2022 in [facility] ED to Hosp-Admission (Discharged) from 05/29/2022 in [facility]  C-SSRS RISK CATEGORY No Risk No Risk No Risk       Assessment and Plan: Mariona Scholes 87 year old African-American female presents to establish care.  She reports a history of anxiety and depression.  States she has been prescribed multiple medications to help with her symptoms however nothing is helping.  States 20+ years ago she was taking alprazolam (xanax) and would like to be restarted on that medication.  Chart review patient has tried multiple medications to help with her depression and anxiety in the past however she is unable to recall the names of the medications.  As documented patient was tried on fluoxetine, BuSpar, citalopram, trazodone and Seroquel.  Patient appears to be a poor historian related to medications.  However does state that her medications is in a pillbox.  States she has a niece to come over to her sister as she resides alone.  Discussed discontinuing fluoxetine 20 mg daily Initiated Cymbalta 20 mg daily and hydroxyzine 10 mg p.o. twice daily as needed and take 20 mg nightly for anxiety/sleep. Continue gabapentin as indicated by primary care provider  Collaboration of Care: Medication Management AEB will follow-up with pharmacy for pill packing -Patient to follow-up 1 month for medication adherence/tolerability  Patient/Guardian was advised Release of Information must be obtained prior to any record release in order to collaborate their care with an outside provider. Patient/Guardian was advised if they have not already done so to contact the registration department to sign all necessary forms in order for [name] to release information regarding their care.   Consent: Patient/Guardian gives verbal consent for treatment and assignment of benefits for services provided during this visit. Patient/Guardian expressed understanding and agreed to proceed.   Oneta Rack, NP 10/8/20242:42 PM

## 2022-10-29 ENCOUNTER — Other Ambulatory Visit: Payer: 59 | Admitting: Pharmacist

## 2022-10-29 ENCOUNTER — Telehealth: Payer: Self-pay | Admitting: Pharmacist

## 2022-10-29 NOTE — Telephone Encounter (Signed)
Unsuccessful outreach to patient to review medication and assess medication adherence. Patinet has been seen at [facility] Recently and medicaitons were changes.  LM on VM with CB# [phone]

## 2022-11-03 ENCOUNTER — Telehealth: Payer: Self-pay | Admitting: Family Medicine

## 2022-11-03 ENCOUNTER — Other Ambulatory Visit: Payer: 59 | Admitting: Pharmacist

## 2022-11-03 NOTE — Telephone Encounter (Signed)
Prescription Request  11/03/2022  LOV: 09/25/2022  What is the name of the medication or equipment?  empagliflozin (JARDIANCE) 25 MG TABS tablet   Have you contacted your pharmacy to request a refill? No   Which pharmacy would you like this sent to? [facility] #[id number] - [place], [place] - [address] [place] [address] Phone: [phone] Fax: [phone]   Patient notified that their request is being sent to the clinical staff for review and that they should receive a response within 2 business days.   Please advise at Mobile [phone] (mobile)

## 2022-11-03 NOTE — Telephone Encounter (Signed)
Second unsuccessful outreach by phone to review recent medication changes with patient. LM on VM with CB# [phone]

## 2022-11-04 ENCOUNTER — Telehealth: Payer: Self-pay | Admitting: Family Medicine

## 2022-11-04 DIAGNOSIS — M170 Bilateral primary osteoarthritis of knee: Secondary | ICD-10-CM

## 2022-11-04 NOTE — Telephone Encounter (Signed)
Prescription Request  11/04/2022  LOV: 09/25/2022  What is the name of the medication or equipment? HYDROcodone-acetaminophen (NORCO/VICODIN) 5-325 MG tablet    Have you contacted your pharmacy to request a refill? Yes   Which pharmacy would you like this sent to?  [facility] #[id number] - [place], [place] - [address] [place] [address] Phone: [phone] Fax: [phone]   Patient notified that their request is being sent to the clinical staff for review and that they should receive a response within 2 business days.   Please advise at Mobile [phone] (mobile)

## 2022-11-05 MED ORDER — HYDROCODONE-ACETAMINOPHEN 5-325 MG PO TABS
1.0000 | ORAL_TABLET | Freq: Three times a day (TID) | ORAL | 0 refills | Status: DC | PRN
Start: 2022-11-05 — End: 2022-12-04

## 2022-11-18 ENCOUNTER — Ambulatory Visit (INDEPENDENT_AMBULATORY_CARE_PROVIDER_SITE_OTHER): Payer: 59 | Admitting: Podiatry

## 2022-11-18 DIAGNOSIS — Z91199 Patient's noncompliance with other medical treatment and regimen due to unspecified reason: Secondary | ICD-10-CM

## 2022-11-18 NOTE — Progress Notes (Signed)
1. No-show for appointment     

## 2022-11-23 ENCOUNTER — Other Ambulatory Visit: Payer: Self-pay | Admitting: Family Medicine

## 2022-11-23 NOTE — Telephone Encounter (Signed)
Prescription Request  11/23/2022  LOV: 09/25/2022  What is the name of the medication or equipment? Accuchek Lancet needles  Have you contacted your pharmacy to request a refill? Yes   Which pharmacy would you like this sent to?  [facility] #[id number] - [place], [place] - [address] [place] [address] Phone: [phone] Fax: [phone]   Patient notified that their request is being sent to the clinical staff for review and that they should receive a response within 2 business days.   Please advise at Mobile [phone] (mobile)

## 2022-11-26 ENCOUNTER — Other Ambulatory Visit: Payer: Self-pay | Admitting: Family Medicine

## 2022-12-01 ENCOUNTER — Encounter (HOSPITAL_COMMUNITY): Payer: Self-pay | Admitting: Family

## 2022-12-01 ENCOUNTER — Ambulatory Visit (HOSPITAL_BASED_OUTPATIENT_CLINIC_OR_DEPARTMENT_OTHER): Payer: 59 | Admitting: Family

## 2022-12-01 ENCOUNTER — Other Ambulatory Visit: Payer: Self-pay

## 2022-12-01 DIAGNOSIS — F419 Anxiety disorder, unspecified: Secondary | ICD-10-CM | POA: Diagnosis not present

## 2022-12-01 MED ORDER — DULOXETINE HCL 20 MG PO CPEP: 40.0000 mg | ORAL_CAPSULE | Freq: Every day | ORAL | 1 refills | Status: DC

## 2022-12-01 NOTE — Progress Notes (Cosign Needed Addendum)
BH MD/PA/NP OP Progress Note  12/01/2022 1:42 PM Casey [name]  MRN:  [id number]  Chief Complaint: Casey [name] stated " this medication is not helping with my nerves, I need the Xanax back".   HPI: Casey [name] was seen and evaluated face-to-face by this provider.  She was initiated on Cymbalta 20 mg at previous visit.  States that she does not feel that this medication is helping with her "nerves".  Patient states she is unsure of how often she has bee then the "little small pill".  States that she did attempt to take 2 tablets and did not have any relief in her symptoms.    Casey [name] states "my pain pill makes me feel relaxed more than anything, I will keep taking that then."  Patient is requesting to be back on Xanax to help with her anxiety.  This provider declined to reinitiate Xanax.  Discussed titrating Cymbalta from 20 mg to 40 mg daily.  Discussed the importance of taking medication consistently.  Patient states "if you are not  going to help me then,  I will not be back there is nothing else that you can do for me."  Reports she will attempt to find a provider to prescribe Xanax for her anxiety.  She reports concerns related to appetite and sleep however did not elaborate with symptoms, only to states that she needs to be more relaxed.   Declined follow-up appointment at this time  During evaluation Casey [name] is sitting with face make on she is alert/oriented x 4; calm/cooperative; and mood congruent with affect.  Patient is speaking in a clear tone at moderate volume, and normal pace; with good eye contact.Her thought process is coherent and relevant; There is no indication that she is currently responding to internal/external stimuli or experiencing delusional thought content.  Patient denies suicidal/self-harm/homicidal ideation, psychosis, and paranoia.  Patient has remained calm throughout assessment and has answered questions appropriately.     Visit Diagnosis: No diagnosis found.  Past Psychiatric  History:   Past Medical History:  Past Medical History:  Diagnosis Date   ALLERGIC RHINITIS    Allergy    ANXIETY    ARM PAIN, RIGHT    BENIGN POSITIONAL VERTIGO    Carcinoma in situ of colon    CONSTIPATION    CYSTOCELE WITH INCOMPLETE UTERINE PROLAPSE    Diabetes mellitus without complication (HCC)    ESOPHAGEAL STRICTURE    FATIGUE    FLANK PAIN, LEFT    Headache(784.0)    HIP PAIN, LEFT    HYPERCHOLESTEROLEMIA    HYPERGLYCEMIA    HYPERTENSION, BENIGN ESSENTIAL 03/03/2007   LEG EDEMA, BILATERAL    Microscopic hematuria    OBESITY    OSTEOARTHRITIS, KNEE    OSTEOPOROSIS    Peripheral neuropathy, idiopathic    Problems with swallowing and mastication    SINUSITIS, RECURRENT    SPRAIN&STRAIN OTH SPEC SITES SHOULDER&UPPER ARM     Past Surgical History:  Procedure Laterality Date   BIOPSY  02/06/2022   Procedure: BIOPSY;  Surgeon: Kathi Der, MD;  Location: [facility];  Service: Gastroenterology;;   COLONOSCOPY     ESOPHAGOGASTRODUODENOSCOPY (EGD) WITH PROPOFOL N/A 02/06/2022   Procedure: ESOPHAGOGASTRODUODENOSCOPY (EGD) WITH PROPOFOL;  Surgeon: Kathi Der, MD;  Location: [facility];  Service: Gastroenterology;  Laterality: N/A;   KNEE ARTHROPLASTY Right 07/24/2020   Procedure: COMPUTER ASSISTED TOTAL KNEE ARTHROPLASTY;  Surgeon: Samson Frederic, MD;  Location: [facility];  Service: Orthopedics;  Laterality: Right;   LEFT HEART  CATHETERIZATION WITH CORONARY ANGIOGRAM N/A 12/02/2010   Procedure: LEFT HEART CATHETERIZATION WITH CORONARY ANGIOGRAM;  Surgeon: Peter M Swaziland, MD;  Location: [facility] CATH LAB;  Service: Cardiovascular;:: NORMAL CORONARY ARTERIES - NORMAL LV FUNCTION & EDP.     TRANSTHORACIC ECHOCARDIOGRAM  11/2010   Normal EF 60-65%. No RWMA.  Mild LAD dilation c/w Gr 1 DD.  -- PRETTY NORMAL ECHO    Family Psychiatric History:   Family History:  Family History  Problem Relation Age of Onset   Coronary artery disease Mother 51   Cancer Father 18    Heart disease Father    Colon cancer Other    Diabetes Other    Coronary artery disease Sister    Diabetes type II Sister    Cancer Sister    Coronary artery disease Brother    Kidney disease Brother    Coronary artery disease Brother    Breast cancer Neg Hx     Social History:  Social History   Socioeconomic History   Marital status: Single    Spouse name: Not on file   Number of children: 1   Years of education: 12   Highest education level: High school graduate  Occupational History   Not on file  Tobacco Use   Smoking status: Never   Smokeless tobacco: Never  Vaping Use   Vaping status: Never Used  Substance and Sexual Activity   Alcohol use: No   Drug use: No   Sexual activity: Not Currently  Other Topics Concern   Not on file  Social History Narrative   She tries to stay active, but over the last year (20 19-20 20), she has been very limited by her right knee pain.   Is able to go grocery shopping.   Social Determinants of Health   Financial Resource Strain: Low Risk  (08/13/2022)   Overall Financial Resource Strain (CARDIA)    Difficulty of Paying Living Expenses: Not hard at all  Food Insecurity: No Food Insecurity (08/31/2022)   Hunger Vital Sign    Worried About Running Out of Food in the Last Year: Never true    Ran Out of Food in the Last Year: Never true  Transportation Needs: No Transportation Needs (08/31/2022)   PRAPARE - Administrator, Civil Service (Medical): No    Lack of Transportation (Non-Medical): No  Physical Activity: Insufficiently Active (08/13/2022)   Exercise Vital Sign    Days of Exercise per Week: 3 days    Minutes of Exercise per Session: 30 min  Stress: Stress Concern Present (08/13/2022)   [employer] of Occupational Health - Occupational Stress Questionnaire    Feeling of Stress : Rather much  Social Connections: Moderately Isolated (08/13/2022)   Social Connection and Isolation Panel [NHANES]    Frequency of  Communication with Friends and Family: More than three times a week    Frequency of Social Gatherings with Friends and Family: Twice a week    Attends Religious Services: 1 to 4 times per year    Active Member of [employer] or Organizations: No    Attends Banker Meetings: Never    Marital Status: Never married    Allergies: No Known Allergies  Metabolic Disorder Labs: Lab Results  Component Value Date   HGBA1C 7.2 (A) 09/25/2022   MPG 182.9 05/31/2022   MPG 143 07/18/2020   No results found for: "PROLACTIN" Lab Results  Component Value Date   CHOL 172 05/31/2022   TRIG 83  05/31/2022   HDL 44 05/31/2022   CHOLHDL 3.9 05/31/2022   VLDL 17 05/31/2022   LDLCALC 111 (H) 05/31/2022   LDLCALC 102 (H) 08/19/2021   Lab Results  Component Value Date   TSH 1.20 08/19/2021   TSH 1.29 04/15/2021    Therapeutic Level Labs: No results found for: "LITHIUM" No results found for: "VALPROATE" No results found for: "CBMZ"  Current Medications: Current Outpatient Medications  Medication Sig Dispense Refill   Accu-Chek Softclix Lancets lancets USE AS DIRECTED IN THE MORNING, AT NOON, AND AT BEDTIME 100 each 1   amLODipine (NORVASC) 2.5 MG tablet Take 2 tablets (5 mg total) by mouth daily. 180 tablet 3   aspirin 81 MG chewable tablet Chew 1 tablet (81 mg total) by mouth daily.     atorvastatin (LIPITOR) 10 MG tablet Take 1 tablet (10 mg total) by mouth daily. 90 tablet 3   azelastine (ASTELIN) 0.1 % nasal spray INSTILL 1 SPRAY INTO BOTH NOSTRILS TWICE DAILY 30 mL 12   azelastine (OPTIVAR) 0.05 % ophthalmic solution Place 1 drop into both eyes 2 (two) times daily.     Blood Glucose Monitoring Suppl DEVI 1 each by Does not apply route in the morning, at noon, and at bedtime. May substitute to any manufacturer covered by patient's insurance.Dx E11.29, 1 each 0   busPIRone (BUSPAR) 10 MG tablet Take 1 tablet (10 mg total) by mouth 2 (two) times daily. 180 tablet 3    Carboxymethylcellul-Glycerin (REFRESH OPTIVE) 1-0.9 % GEL Place 1 drop into both eyes daily.     donepezil (ARICEPT) 10 MG tablet Take 1 tablet (10 mg total) by mouth at bedtime. 90 tablet 0   DULoxetine (CYMBALTA) 20 MG capsule Take 1 capsule (20 mg total) by mouth daily. 30 capsule 1   empagliflozin (JARDIANCE) 25 MG TABS tablet Take 1 tablet (25 mg total) by mouth daily before breakfast. 90 tablet 1   FLUoxetine (PROZAC) 20 MG tablet Take 1 tablet (20 mg total) by mouth daily. 90 tablet 3   fluticasone (FLONASE) 50 MCG/ACT nasal spray Place 1 spray into both nostrils daily for 7 days. 9.9 mL 2   furosemide (LASIX) 40 MG tablet TAKE 1 TABLET(40 MG) BY MOUTH DAILY 90 tablet 0   gabapentin (NEURONTIN) 300 MG capsule Take 1 capsule (300 mg total) by mouth 3 (three) times daily. 90 capsule 3   glucose blood (ACCU-CHEK GUIDE) test strip USE AS DIRECTED IN THE MORNING, AT NOON, AND AT BEDTIME 100 strip 4   HYDROcodone-acetaminophen (NORCO/VICODIN) 5-325 MG tablet Take 1 tablet by mouth every 8 (eight) hours as needed for moderate pain (pain score 4-6). For chronic pain 90 tablet 0   hydrOXYzine (ATARAX) 10 MG tablet Take 1 tablet (10 mg total) by mouth 2 (two) times daily as needed for anxiety. May take 2 tablet ( 20 mg total) by mouth at night 60 tablet 0   meclizine (ANTIVERT) 25 MG tablet TAKE 1 TABLET(25 MG) BY MOUTH THREE TIMES DAILY AS NEEDED FOR DIZZINESS 30 tablet 0   Multiple Vitamins-Minerals (CERTAVITE/ANTIOXIDANTS) TABS Take 1 tablet by mouth at bedtime. 130 tablet 0   pantoprazole (PROTONIX) 40 MG tablet Take 40 mg by mouth daily.     polyethylene glycol powder (GLYCOLAX/MIRALAX) 17 GM/SCOOP powder Take 17 g by mouth daily. (Patient taking differently: Take 17 g by mouth daily as needed for mild constipation or moderate constipation.) 238 g 0   potassium chloride SA (KLOR-CON M) 20 MEQ tablet Take 1 tablet (  20 mEq total) by mouth daily. 30 tablet 5   senna-docusate (SENOKOT-S) 8.6-50 MG  tablet Take 1 tablet by mouth at bedtime as needed for mild constipation. 30 tablet 0   No current facility-administered medications for this visit.     Musculoskeletal: Strength & Muscle Tone: within normal limits Gait & Station: normal Patient leans: N/A  Psychiatric Specialty Exam: Review of Systems  Blood pressure (!) 147/83, pulse 76, height 5\' 2"  (1.575 m), weight 168 lb (76.2 kg).Body mass index is 30.73 kg/m.  General Appearance: Casual  Eye Contact:  Good  Speech:  Clear and Coherent  Volume:  Normal  Mood:  Anxious and Depressed  Affect:  Congruent  Thought Process:  Coherent  Orientation:  Full (Time, Place, and Person)  Thought Content: WDL   Suicidal Thoughts:  No  Homicidal Thoughts:  No  Memory:  Immediate;   Good Recent;   Good  Judgement:  Good  Insight:  Good  Psychomotor Activity:  Normal  Concentration:  Concentration: Good  Recall:  Good  Fund of Knowledge: Good  Language: Good  Akathisia:  No  Handed:  Right  AIMS (if indicated): not done  Assets:  Communication Skills Desire for Improvement Resilience Social Support  ADL's:  Intact  Cognition: WNL  Sleep:  Fair   Screenings: GAD-7    Flowsheet Row Office Visit from 07/16/2022 in [facility] Office Visit from 01/23/2019 in Primary Care at [place] Office Visit from 09/08/2018 in Primary Care at [facility] Visit from 08/09/2018 in Primary Care at [facility] Visit from 07/14/2018 in Primary Care at [facility]  Total GAD-7 Score 18 8 16 15 7       Mini-Mental    Flowsheet Row Office Visit from 03/07/2021 in [facility] Office Visit from 09/12/2020 in [facility] Office Visit from 01/03/2020 in [facility]  Total Score (max 30 points ) 23 22 22       PHQ2-9    Flowsheet Row [facility] Visit from 10/27/2022 in [facility] Office Visit from 09/25/2022  in [facility] Visit from 08/25/2022 in [facility] from 08/13/2022 in [facility] Visit from 07/16/2022 in [facility]  PHQ-2 Total Score 3 0 0 1 4  PHQ-9 Total Score 12 0 0 -- 12      Flowsheet Row ED from 08/29/2022 in [facility] Emergency Department at [facility] ED from 06/08/2022 in [facility] ED to Hosp-Admission (Discharged) from 05/29/2022 in [facility]  C-SSRS RISK CATEGORY No Risk No Risk No Risk        Assessment and Plan: Casey [name] 87 year old African-American female presents for medication management appointment.  Patient was initiated on Cymbalta 20 mg daily and provided with hydroxyzine 10 mg p.o. 3 times daily as needed.  Unsure if patient has been taking medications as directed. Although reports she took Cymbalta 20 mg twice in 1 day without symptom relief.  Discussed increasing Cymbalta to 40 mg and encourage patient to take medications as directed daily.   -Patient carries a diagnosis of what dementia without behavioral disturbance currently prescribed Aricept.  Consideration for pharmacy pill packing.   Patient appears to be seeking Xanax for symptom relief.  She declined follow-up appointment. Patient does not appear to be compliant with current medication regimen.   Currently she is  prescribed Cymbalta 40 mg daily Continue Prozac 20 mg daily Continue gabapentin 300 mg p.o. 3 times daily Continue hydroxyzine 10 mg p.o. twice daily as needed Continue BuSpar 10 mg p.o. twice daily    Collaboration of Care: Collaboration of Care: Other patient declined follow-up at this time  Patient/Guardian was advised Release of Information must be obtained prior to any record release in order to collaborate their care with an outside provider. Patient/Guardian was advised if they have not already  done so to contact the registration department to sign all necessary forms in order for [name] to release information regarding their care.   Consent: Patient/Guardian gives verbal consent for treatment and assignment of benefits for services provided during this visit. Patient/Guardian expressed understanding and agreed to proceed.    Oneta Rack, NP 12/01/2022, 1:42 PM

## 2022-12-04 ENCOUNTER — Other Ambulatory Visit: Payer: Self-pay | Admitting: Family Medicine

## 2022-12-04 DIAGNOSIS — M170 Bilateral primary osteoarthritis of knee: Secondary | ICD-10-CM

## 2022-12-04 NOTE — Telephone Encounter (Signed)
Prescription Request  12/04/2022  LOV: 09/25/2022  What is the name of the medication or equipment?  HYDROcodone-acetaminophen (NORCO/VICODIN) 5-325 MG tablet    Have you contacted your pharmacy to request a refill? No   Which pharmacy would you like this sent to? [id number] - [place], [place] - [address] AT [address] [place] [address] Phone: [phone] Fax: [phone]   Patient notified that their request is being sent to the clinical staff for review and that they should receive a response within 2 business days.   Please advise at Mobile [phone] (mobile)

## 2022-12-07 MED ORDER — HYDROCODONE-ACETAMINOPHEN 5-325 MG PO TABS
1.0000 | ORAL_TABLET | Freq: Three times a day (TID) | ORAL | 0 refills | Status: DC | PRN
Start: 2022-12-07 — End: 2023-01-04

## 2022-12-16 ENCOUNTER — Ambulatory Visit: Payer: Self-pay

## 2022-12-16 ENCOUNTER — Other Ambulatory Visit: Payer: 59 | Admitting: *Deleted

## 2022-12-16 NOTE — Patient Instructions (Signed)
Visit Information  Thank you for taking time to visit with me today. Please don't hesitate to contact me if I can be of assistance to you.   Following are the goals we discussed today:   Goals Addressed             This Visit's Progress    Diabetes Management       Patient Goals/Self Care Activities: -Patient/Caregiver will take medications as prescribed   -Patient/Caregiver will attend all scheduled provider appointments -Patient/Caregiver will call provider office for new concerns or questions   -check blood sugar at prescribed times -check blood sugar if I feel it is too high or too low -record values and write them down take them to all doctor visits  -schedule appointment with eye doctor   Spoke with patient.  She reports she is okay but her sugars are up.  Today was 190 before eating. Discussed her diet and diabetes management.  She verbalized understanding.  She declines eating carbohydrates and sweets.  She will see PCP next week and will discuss sugars then.  No concerns.         Our next appointment is by telephone on 12/31/22 at 100 pm  Please call the care guide team at [phone] if you need to cancel or reschedule your appointment.   If you are experiencing a Mental Health or Behavioral Health Crisis or need someone to talk to, please call the Suicide and Crisis Lifeline: 988   The patient verbalized understanding of instructions, educational materials, and care plan provided today and DECLINED offer to receive copy of patient instructions, educational materials, and care plan.   The patient has been provided with contact information for the care management team and has been advised to call with any health related questions or concerns.   Bary Leriche, RN, MSN RN Care Manager [facility], Population Health Direct Dial: [phone]  Fax: [phone] Website: [email or web address]

## 2022-12-16 NOTE — Patient Outreach (Signed)
  Care Coordination   Follow Up Visit Note   12/16/2022 Name: Casey [name] MRN: [id number] DOB: 1935-02-11  Casey [name] is a 87 y.o. year old female who sees Ardith Dark, MD for primary care. I spoke with  Casey [name] by phone today.  What matters to the patients health and wellness today?  Diabetes management    Goals Addressed             This Visit's Progress    Diabetes Management       Patient Goals/Self Care Activities: -Patient/Caregiver will take medications as prescribed   -Patient/Caregiver will attend all scheduled provider appointments -Patient/Caregiver will call provider office for new concerns or questions   -check blood sugar at prescribed times -check blood sugar if I feel it is too high or too low -record values and write them down take them to all doctor visits  -schedule appointment with eye doctor   Spoke with patient.  She reports she is okay but her sugars are up.  Today was 190 before eating. Discussed her diet and diabetes management.  She verbalized understanding.  She declines eating carbohydrates and sweets.  She will see PCP next week and will discuss sugars then.  No concerns.         SDOH assessments and interventions completed:  Yes  SDOH Interventions Today    Flowsheet Row Most Recent Value  SDOH Interventions   Food Insecurity Interventions Assist with SNAP Application, Intervention Not Indicated  Housing Interventions Intervention Not Indicated  Transportation Interventions Intervention Not Indicated  Utilities Interventions Intervention Not Indicated  Depression Interventions/Treatment  Medication, Counseling  Health Literacy Interventions Intervention Not Indicated        Care Coordination Interventions:  Yes, provided   Follow up plan: Follow up call scheduled for December    Encounter Outcome:  Patient Visit Completed   Bary Leriche, RN, MSN RN Care Manager [facility],  Population Health Direct Dial: [phone]  Fax: [phone] Website: [email or web address]

## 2022-12-25 ENCOUNTER — Encounter: Payer: Self-pay | Admitting: Family Medicine

## 2022-12-25 ENCOUNTER — Ambulatory Visit (INDEPENDENT_AMBULATORY_CARE_PROVIDER_SITE_OTHER): Payer: 59 | Admitting: Family Medicine

## 2022-12-25 VITALS — BP 143/80 | HR 77 | Temp 97.3°F | Ht 62.0 in | Wt 173.4 lb

## 2022-12-25 DIAGNOSIS — E1129 Type 2 diabetes mellitus with other diabetic kidney complication: Secondary | ICD-10-CM

## 2022-12-25 DIAGNOSIS — R809 Proteinuria, unspecified: Secondary | ICD-10-CM | POA: Diagnosis not present

## 2022-12-25 DIAGNOSIS — N281 Cyst of kidney, acquired: Secondary | ICD-10-CM

## 2022-12-25 DIAGNOSIS — E1159 Type 2 diabetes mellitus with other circulatory complications: Secondary | ICD-10-CM | POA: Diagnosis not present

## 2022-12-25 DIAGNOSIS — Z7984 Long term (current) use of oral hypoglycemic drugs: Secondary | ICD-10-CM | POA: Diagnosis not present

## 2022-12-25 DIAGNOSIS — N2889 Other specified disorders of kidney and ureter: Secondary | ICD-10-CM

## 2022-12-25 DIAGNOSIS — I152 Hypertension secondary to endocrine disorders: Secondary | ICD-10-CM

## 2022-12-25 DIAGNOSIS — H8110 Benign paroxysmal vertigo, unspecified ear: Secondary | ICD-10-CM

## 2022-12-25 LAB — MICROALBUMIN / CREATININE URINE RATIO
Creatinine,U: 101.3 mg/dL
Microalb Creat Ratio: 8.3 mg/g (ref 0.0–30.0)
Microalb, Ur: 8.4 mg/dL — ABNORMAL HIGH (ref 0.0–1.9)

## 2022-12-25 LAB — POCT GLYCOSYLATED HEMOGLOBIN (HGB A1C): Hemoglobin A1C: 7.3 % — AB (ref 4.0–5.6)

## 2022-12-25 MED ORDER — PANTOPRAZOLE SODIUM 40 MG PO TBEC: 40.0000 mg | DELAYED_RELEASE_TABLET | Freq: Every day | ORAL | 3 refills | Status: AC

## 2022-12-25 MED ORDER — SITAGLIPTIN PHOSPHATE 100 MG PO TABS: 100.0000 mg | ORAL_TABLET | Freq: Every day | ORAL | 3 refills | Status: DC

## 2022-12-25 MED ORDER — EMPAGLIFLOZIN 25 MG PO TABS: 25.0000 mg | ORAL_TABLET | Freq: Every day | ORAL | 1 refills | Status: DC

## 2022-12-25 MED ORDER — MECLIZINE HCL 25 MG PO TABS: 25.0000 mg | ORAL_TABLET | Freq: Three times a day (TID) | ORAL | 0 refills | Status: DC | PRN

## 2022-12-25 NOTE — Assessment & Plan Note (Addendum)
Blood pressure initially elevated at 149/80 and 143/80 on recheck.  She is on amlodipine 5 mg daily.  Was well-controlled previously.  Will continue with current regimen for now.  She will continue to monitor at home and let us know if persistently elevated.

## 2022-12-25 NOTE — Assessment & Plan Note (Addendum)
We will repeat MRI per radiology recommendations.

## 2022-12-25 NOTE — Progress Notes (Signed)
   Casey [name] is a 87 y.o. female who presents today for an office visit.  Assessment/Plan:  New/Acute Problems: Left Shoulder Pain  Limited exam today due to degree of pain.  Given length of symptoms for the past month that she does need further workup for this at this point.  May have some underlying rotator cuff pathology however concern for adhesive capsulitis as well given her limited range of motion.  She likely does need plain films for this however we are referring to sports medicine and will defer imaging to them.  She can continue taking over-the-counter meds as needed.  We discussed reasons to return to care.  Chronic Problems Addressed Today: Type 2 diabetes mellitus with microalbuminuria, without long-term current use of insulin (HCC) A1c overall stable at 7.3 however she has had some elevated readings at home.  Discussed with patient that she should not double up on her dose of Jardiance.  We will check labs today continue Jardiance 25 mg daily.  Will add on Januvia 100 mg daily as well for her reported high blood sugar readings at home.  She is aware of potential side effects.  She will follow-up with me in 3 months.  Again reiterated that she should take medications only as prescribed and contact us if she has any concerns.  Hypertension associated with diabetes (HCC) Blood pressure initially elevated at 149/80 and 143/80 on recheck.  She is on amlodipine 5 mg daily.  Was well-controlled previously.  Will continue with current regimen for now.  She will continue to monitor at home and let us know if persistently elevated.  BENIGN POSITIONAL VERTIGO On meclizine 25 mg 3 times daily as needed.  Will refill today.  Renal cyst, Bilateral We will repeat MRI per radiology recommendations.     Subjective:  HPI:  See A/P for status of chronic conditions.  Patient is here today for follow-up.  I last saw her 3 months ago. At that time A1c was 7.2 and we continue Jardiance 25 mg  daily. She has had some readings above 200 and has occasionally taken 2 jardiance.  Her home sugar readings are usually fasting.  Otherwise she feels well today.  She did fall about a month ago while hanging curtains.  Landed on her left side.  She did not suffer any immediate injuries however since then she is having some ongoing pain in her left shoulder.  This seems to be getting worse over the last couple of weeks.  She has been trying over-the-counter medications as well as her home pain meds without much improvement.  Worse with most motions including reaching above her head.       Objective:  Physical Exam: BP (!) 143/80   Pulse 77   Temp (!) 97.3 F (36.3 C) (Temporal)   Ht 5\' 2"  (1.575 m)   Wt 173 lb 6.4 oz (78.7 kg)   SpO2 100%   BMI 31.72 kg/m   Gen: No acute distress, resting comfortably CV: Regular rate and rhythm with no murmurs appreciated Pulm: Normal work of breathing, clear to auscultation bilaterally with no crackles, wheezes, or rhonchi MUSCULOSKELETAL: - Left Shoulder: No deformities.  Tender to palpation throughout.  Limited range of motion due to pain.  Neurovascular intact distally. Unable to full assess rotator cuff strength testing due to degree of pain. Neuro: Grossly normal, moves all extremities Psych: Normal affect and thought content      Lua Feng M. [name], MD 12/25/2022 12:27 PM

## 2022-12-25 NOTE — Assessment & Plan Note (Signed)
A1c overall stable at 7.3 however she has had some elevated readings at home.  Discussed with patient that she should not double up on her dose of Jardiance.  We will check labs today continue Jardiance 25 mg daily.  Will add on Januvia 100 mg daily as well for her reported high blood sugar readings at home.  She is aware of potential side effects.  She will follow-up with me in 3 months.  Again reiterated that she should take medications only as prescribed and contact us if she has any concerns.

## 2022-12-25 NOTE — Patient Instructions (Addendum)
It was very nice to see you today!  Please do not double up your occasions.  We will start Januvia for your sugar.  I will refill your other medications.  I will refer you to see sports medicine for your shoulder.  We will check another MRI for your kidneys.  I will see back in 3 months.  Come back sooner if needed.  Return in about 3 months (around 03/25/2023).   Take care, Dr [name]  PLEASE NOTE:  If you had any lab tests, please let us know if you have not heard back within a few days. You may see your results on mychart before we have a chance to review them but we will give you a call once they are reviewed by [name].   If we ordered any referrals today, please let us know if you have not heard from their office within the next week.   If you had any urgent prescriptions sent in today, please check with the pharmacy within an hour of our visit to make sure the prescription was transmitted appropriately.   Please try these tips to maintain a healthy lifestyle:  Eat at least 3 REAL meals and 1-2 snacks per day.  Aim for no more than 5 hours between eating.  If you eat breakfast, please do so within one hour of getting up.   Each meal should contain half fruits/vegetables, one quarter protein, and one quarter carbs (no bigger than a computer mouse)  Cut down on sweet beverages. This includes juice, soda, and sweet tea.   Drink at least 1 glass of water with each meal and aim for at least 8 glasses per day  Exercise at least 150 minutes every week.

## 2022-12-25 NOTE — Assessment & Plan Note (Signed)
On meclizine 25 mg 3 times daily as needed.  Will refill today.

## 2022-12-28 ENCOUNTER — Telehealth: Payer: Self-pay | Admitting: Family Medicine

## 2022-12-28 NOTE — Progress Notes (Signed)
Urine test is normal

## 2022-12-28 NOTE — Telephone Encounter (Signed)
Patient was returning Stella's call in regards to her lab results.

## 2022-12-29 NOTE — Telephone Encounter (Signed)
Pt called again for a call back

## 2022-12-29 NOTE — Telephone Encounter (Signed)
Returned pt call,  please refer to lab note

## 2022-12-31 ENCOUNTER — Ambulatory Visit: Payer: Self-pay

## 2022-12-31 NOTE — Patient Outreach (Signed)
  Care Coordination   Follow Up Visit Note   12/31/2022 Name: Casey [name] MRN: [id number] DOB: 04/20/1935  Casey [name] is a 87 y.o. year old female who sees Ardith Dark, MD for primary care. I spoke with  Casey [name] by phone today.  What matters to the patients health and wellness today?  Managing diabetes    Goals Addressed             This Visit's Progress    Diabetes Management       Patient Goals/Self Care Activities: -Patient/Caregiver will take medications as prescribed   -Patient/Caregiver will attend all scheduled provider appointments -Patient/Caregiver will call provider office for new concerns or questions   -check blood sugar at prescribed times -check blood sugar if I feel it is too high or too low -record values and write them down take them to all doctor visits  -schedule appointment with eye doctor   Spoke with patient.  She reports seeing PCP and A1c 7.3.  She reports her sugars are higher at times as well. Reiterated diet control and exercise for diabetes management.  She verbalized understanding.  No RN CM concerns.         SDOH assessments and interventions completed:  Yes     Care Coordination Interventions:  Yes, provided   Follow up plan: Follow up call scheduled for January    Encounter Outcome:  Patient Visit Completed   Bary Leriche, RN, MSN RN Care Manager [facility], Population Health Direct Dial: [phone]  Fax: [phone] Website: [email or web address]

## 2022-12-31 NOTE — Patient Instructions (Signed)
Visit Information  Thank you for taking time to visit with me today. Please don't hesitate to contact me if I can be of assistance to you.   Following are the goals we discussed today:   Goals Addressed             This Visit's Progress    Diabetes Management       Patient Goals/Self Care Activities: -Patient/Caregiver will take medications as prescribed   -Patient/Caregiver will attend all scheduled provider appointments -Patient/Caregiver will call provider office for new concerns or questions   -check blood sugar at prescribed times -check blood sugar if I feel it is too high or too low -record values and write them down take them to all doctor visits  -schedule appointment with eye doctor   Spoke with patient.  She reports seeing PCP and A1c 7.3.  She reports her sugars are higher at times as well. Reiterated diet control and exercise for diabetes management.  She verbalized understanding.  No RN CM concerns.         Our next appointment is by telephone on 01/26/23 at 100 pm  Please call the care guide team at [phone] if you need to cancel or reschedule your appointment.   If you are experiencing a Mental Health or Behavioral Health Crisis or need someone to talk to, please call the Suicide and Crisis Lifeline: 988   The patient verbalized understanding of instructions, educational materials, and care plan provided today and DECLINED offer to receive copy of patient instructions, educational materials, and care plan.   The patient has been provided with contact information for the care management team and has been advised to call with any health related questions or concerns.   Bary Leriche, RN, MSN RN Care Manager [facility], Population Health Direct Dial: [phone]  Fax: [phone] Website: [email or web address]

## 2023-01-02 ENCOUNTER — Other Ambulatory Visit (HOSPITAL_COMMUNITY): Payer: Self-pay | Admitting: Family

## 2023-01-04 ENCOUNTER — Telehealth: Payer: Self-pay | Admitting: Family Medicine

## 2023-01-04 DIAGNOSIS — M170 Bilateral primary osteoarthritis of knee: Secondary | ICD-10-CM

## 2023-01-04 MED ORDER — HYDROCODONE-ACETAMINOPHEN 5-325 MG PO TABS
1.0000 | ORAL_TABLET | Freq: Three times a day (TID) | ORAL | 0 refills | Status: DC | PRN
Start: 2023-01-04 — End: 2023-02-03

## 2023-01-04 NOTE — Telephone Encounter (Signed)
Noted  

## 2023-01-04 NOTE — Telephone Encounter (Signed)
Prescription Request  01/04/2023  LOV: 12/25/2022  What is the name of the medication or equipment?  HYDROcodone-acetaminophen (NORCO/VICODIN) 5-325 MG tablet    Have you contacted your pharmacy to request a refill? No   Which pharmacy would you like this sent to? [id number] - [place], [place] - [address] AT [address] [place] [address] Phone: [phone] Fax: [phone]   Patient notified that their request is being sent to the clinical staff for review and that they should receive a response within 2 business days.   Please advise at Mobile [phone] (mobile)

## 2023-01-08 ENCOUNTER — Ambulatory Visit (HOSPITAL_COMMUNITY)
Admission: RE | Admit: 2023-01-08 | Discharge: 2023-01-08 | Disposition: A | Discharge status: Home / Self Care | Payer: 59 | Source: Ambulatory Visit | Attending: Family Medicine | Admitting: Family Medicine

## 2023-01-08 DIAGNOSIS — N2889 Other specified disorders of kidney and ureter: Secondary | ICD-10-CM | POA: Diagnosis present

## 2023-01-08 MED ORDER — GADOBUTROL 1 MMOL/ML IV SOLN
7.5000 mL | Freq: Once | INTRAVENOUS | Status: AC | PRN
Start: 2023-01-08 — End: 2023-01-08
  Administered 2023-01-08: 7.5 mL via INTRAVENOUS

## 2023-01-18 NOTE — Progress Notes (Signed)
Her MRI showed that she has a irregular cyst in her right kidney.  Recommend referral to urology for further evaluation.  Please place referral.

## 2023-01-19 ENCOUNTER — Other Ambulatory Visit: Payer: Self-pay | Admitting: *Deleted

## 2023-01-19 ENCOUNTER — Telehealth: Payer: Self-pay | Admitting: Family Medicine

## 2023-01-19 DIAGNOSIS — N281 Cyst of kidney, acquired: Secondary | ICD-10-CM

## 2023-01-19 NOTE — Telephone Encounter (Signed)
 Copied from CRM [id number]. Topic: General - Call Back - No Documentation >> Jan 19, 2023  2:33 PM Samuel Jester B wrote: Reason for CRM: Pt stated that she is requesting a callback after receiving a call from her PCP's nurse.    See results note  Stella,RMA

## 2023-01-26 ENCOUNTER — Emergency Department (HOSPITAL_COMMUNITY): Payer: 59

## 2023-01-26 ENCOUNTER — Ambulatory Visit: Payer: Self-pay

## 2023-01-26 ENCOUNTER — Other Ambulatory Visit: Payer: Self-pay

## 2023-01-26 ENCOUNTER — Encounter (HOSPITAL_COMMUNITY): Payer: Self-pay

## 2023-01-26 ENCOUNTER — Emergency Department (HOSPITAL_COMMUNITY)
Admission: EM | Admit: 2023-01-26 | Discharge: 2023-01-27 | Disposition: A | Discharge status: Home / Self Care | Payer: 59 | Attending: Emergency Medicine | Admitting: Emergency Medicine

## 2023-01-26 DIAGNOSIS — D72829 Elevated white blood cell count, unspecified: Secondary | ICD-10-CM | POA: Insufficient documentation

## 2023-01-26 DIAGNOSIS — I10 Essential (primary) hypertension: Secondary | ICD-10-CM | POA: Diagnosis not present

## 2023-01-26 DIAGNOSIS — Z79899 Other long term (current) drug therapy: Secondary | ICD-10-CM | POA: Diagnosis not present

## 2023-01-26 DIAGNOSIS — R5381 Other malaise: Secondary | ICD-10-CM

## 2023-01-26 DIAGNOSIS — R197 Diarrhea, unspecified: Secondary | ICD-10-CM | POA: Diagnosis not present

## 2023-01-26 DIAGNOSIS — E119 Type 2 diabetes mellitus without complications: Secondary | ICD-10-CM | POA: Insufficient documentation

## 2023-01-26 DIAGNOSIS — Z7982 Long term (current) use of aspirin: Secondary | ICD-10-CM | POA: Insufficient documentation

## 2023-01-26 DIAGNOSIS — R519 Headache, unspecified: Secondary | ICD-10-CM | POA: Diagnosis not present

## 2023-01-26 DIAGNOSIS — Z7984 Long term (current) use of oral hypoglycemic drugs: Secondary | ICD-10-CM | POA: Insufficient documentation

## 2023-01-26 DIAGNOSIS — R42 Dizziness and giddiness: Secondary | ICD-10-CM | POA: Insufficient documentation

## 2023-01-26 DIAGNOSIS — R112 Nausea with vomiting, unspecified: Secondary | ICD-10-CM | POA: Diagnosis not present

## 2023-01-26 LAB — COMPREHENSIVE METABOLIC PANEL
ALT: 29 U/L (ref 0–44)
AST: 35 U/L (ref 15–41)
Albumin: 4.1 g/dL (ref 3.5–5.0)
Alkaline Phosphatase: 92 U/L (ref 38–126)
Anion gap: 13 (ref 5–15)
BUN: 11 mg/dL (ref 8–23)
CO2: 20 mmol/L — ABNORMAL LOW (ref 22–32)
Calcium: 9.3 mg/dL (ref 8.9–10.3)
Chloride: 101 mmol/L (ref 98–111)
Creatinine, Ser: 0.97 mg/dL (ref 0.44–1.00)
GFR, Estimated: 57 mL/min — ABNORMAL LOW (ref >60)
Glucose, Bld: 276 mg/dL — ABNORMAL HIGH (ref 70–99)
Potassium: 3.5 mmol/L (ref 3.5–5.1)
Sodium: 134 mmol/L — ABNORMAL LOW (ref 135–145)
Total Bilirubin: 0.7 mg/dL (ref 0.0–1.2)
Total Protein: 7.5 g/dL (ref 6.5–8.1)

## 2023-01-26 LAB — CBC
HCT: 40.8 % (ref 36.0–46.0)
Hemoglobin: 13.4 g/dL (ref 12.0–15.0)
MCH: 28.4 pg (ref 26.0–34.0)
MCHC: 32.8 g/dL (ref 30.0–36.0)
MCV: 86.4 fL (ref 80.0–100.0)
Platelets: 258 10*3/uL (ref 150–400)
RBC: 4.72 MIL/uL (ref 3.87–5.11)
RDW: 15.7 % — ABNORMAL HIGH (ref 11.5–15.5)
WBC: 19 10*3/uL — ABNORMAL HIGH (ref 4.0–10.5)
nRBC: 0 % (ref 0.0–0.2)

## 2023-01-26 LAB — URINALYSIS, ROUTINE W REFLEX MICROSCOPIC
Bacteria, UA: NONE SEEN
Bilirubin Urine: NEGATIVE
Glucose, UA: >=500 mg/dL — AB
Ketones, ur: NEGATIVE mg/dL
Leukocytes,Ua: NEGATIVE
Nitrite: NEGATIVE
Protein, ur: 100 mg/dL — AB
Specific Gravity, Urine: 1.022 (ref 1.005–1.030)
pH: 6 (ref 5.0–8.0)

## 2023-01-26 LAB — LIPASE, BLOOD: Lipase: 33 U/L (ref 11–51)

## 2023-01-26 MED ORDER — ACETAMINOPHEN 500 MG PO TABS
1000.0000 mg | ORAL_TABLET | Freq: Once | ORAL | Status: AC
  Administered 2023-01-26: 1000 mg via ORAL
  Filled 2023-01-26: qty 2

## 2023-01-26 MED ORDER — LORAZEPAM 2 MG/ML IJ SOLN
1.0000 mg | Freq: Once | INTRAMUSCULAR | Status: AC
  Administered 2023-01-26: 1 mg via INTRAVENOUS
  Filled 2023-01-26: qty 1

## 2023-01-26 MED ORDER — MECLIZINE HCL 25 MG PO TABS
25.0000 mg | ORAL_TABLET | Freq: Once | ORAL | Status: AC
  Administered 2023-01-26: 25 mg via ORAL
  Filled 2023-01-26: qty 1

## 2023-01-26 MED ORDER — METOCLOPRAMIDE HCL 5 MG/ML IJ SOLN
10.0000 mg | Freq: Once | INTRAMUSCULAR | Status: AC
  Administered 2023-01-26: 10 mg via INTRAVENOUS
  Filled 2023-01-26: qty 2

## 2023-01-26 MED ORDER — LACTATED RINGERS IV BOLUS
500.0000 mL | Freq: Once | INTRAVENOUS | Status: AC
  Administered 2023-01-26: 500 mL via INTRAVENOUS

## 2023-01-26 MED ORDER — IOHEXOL 350 MG/ML SOLN
75.0000 mL | Freq: Once | INTRAVENOUS | Status: AC | PRN
  Administered 2023-01-26: 75 mL via INTRAVENOUS

## 2023-01-26 NOTE — ED Provider Notes (Signed)
 [place] EMERGENCY DEPARTMENT AT [facility] Provider Note   CSN: [id number] Arrival date & time: 01/26/23  1527     History  Chief Complaint  Patient presents with   Nausea    Casey [name] is a 88 y.o. female.  Patient is an 88 year old female with a history of hypertension, hypercholesterolemia, diabetes, bilateral lower extremity edema, vertigo on meclizine  3 times a day who presents today with complaints of dizziness nausea vomiting and occasional diarrhea.  She reports the dizziness started last night and she has had persistent vomiting.  She reports she feels drunk and is having a difficult time getting up and trying to walk.  She feels very shaky and is also complaining of a headache in the front and back of her head.  She denies any falls.  No recent medication changes.  She has not had abdominal pain, fever, cough or shortness of breath.  She reports that when her eyes are open it makes her feel even more dizzy.  She reports this feels different than her typical dizziness as this is much more severe.  The history is provided by the patient and medical records.       Home Medications Prior to Admission medications   Medication Sig Start Date End Date Taking? Authorizing Provider  Accu-Chek Softclix Lancets lancets USE AS DIRECTED IN THE MORNING, AT NOON, AND AT BEDTIME 11/23/22   Kennyth Worth HERO, MD  amLODipine  (NORVASC ) 2.5 MG tablet Take 2 tablets (5 mg total) by mouth daily. 07/16/22   Kennyth Worth HERO, MD  aspirin  81 MG chewable tablet Chew 1 tablet (81 mg total) by mouth daily. 08/13/22   Kennyth Worth HERO, MD  atorvastatin  (LIPITOR) 10 MG tablet Take 1 tablet (10 mg total) by mouth daily. 10/15/22   Kennyth Worth HERO, MD  azelastine  (ASTELIN ) 0.1 % nasal spray INSTILL 1 SPRAY INTO BOTH NOSTRILS TWICE DAILY 09/07/22   Kennyth Worth HERO, MD  azelastine  (OPTIVAR ) 0.05 % ophthalmic solution Place 1 drop into both eyes 2 (two) times daily.    [provider]  Blood  Glucose Monitoring Suppl DEVI 1 each by Does not apply route in the morning, at noon, and at bedtime. May substitute to any manufacturer covered by patient's insurance.Dx E11.29, 06/17/22   Kennyth Worth HERO, MD  busPIRone  (BUSPAR ) 10 MG tablet Take 1 tablet (10 mg total) by mouth 2 (two) times daily. 09/25/22   Kennyth Worth HERO, MD  Carboxymethylcellul-Glycerin  (REFRESH OPTIVE) 1-0.9 % GEL Place 1 drop into both eyes daily.    [provider]  donepezil  (ARICEPT ) 10 MG tablet Take 1 tablet (10 mg total) by mouth at bedtime. 10/21/22   Kennyth Worth HERO, MD  DULoxetine  (CYMBALTA ) 20 MG capsule Take 2 capsules (40 mg total) by mouth daily. 12/01/22   Ezzard Staci SAILOR, NP  empagliflozin  (JARDIANCE ) 25 MG TABS tablet Take 1 tablet (25 mg total) by mouth daily before breakfast. 12/25/22   Kennyth Worth HERO, MD  FLUoxetine  (PROZAC ) 20 MG tablet Take 1 tablet (20 mg total) by mouth daily. 08/25/22   Kennyth Worth HERO, MD  fluticasone  (FLONASE ) 50 MCG/ACT nasal spray Place 1 spray into both nostrils daily for 7 days. 08/29/22 09/05/22  Randol Simmonds, MD  furosemide  (LASIX ) 40 MG tablet TAKE 1 TABLET(40 MG) BY MOUTH DAILY 10/16/22   Kennyth Worth HERO, MD  gabapentin  (NEURONTIN ) 300 MG capsule Take 1 capsule (300 mg total) by mouth 3 (three) times daily. 08/25/22   Kennyth Worth  M, MD  glucose blood (ACCU-CHEK GUIDE) test strip USE AS DIRECTED IN THE MORNING, AT NOON, AND AT BEDTIME 09/14/22   Kennyth Worth HERO, MD  HYDROcodone -acetaminophen  (NORCO/VICODIN) 5-325 MG tablet Take 1 tablet by mouth every 8 (eight) hours as needed for moderate pain (pain score 4-6). For chronic pain 01/04/23   Kennyth Worth HERO, MD  hydrOXYzine  (ATARAX ) 10 MG tablet Take 1 tablet (10 mg total) by mouth 2 (two) times daily as needed for anxiety. May take 2 tablet ( 20 mg total) by mouth at night 10/27/22   Ezzard Staci SAILOR, NP  meclizine  (ANTIVERT ) 25 MG tablet Take 1 tablet (25 mg total) by mouth 3 (three) times daily as needed for dizziness. 12/25/22    Kennyth Worth HERO, MD  Multiple Vitamins-Minerals (CERTAVITE/ANTIOXIDANTS) TABS Take 1 tablet by mouth at bedtime. 06/02/22   Milissa Tod PARAS, MD  pantoprazole  (PROTONIX ) 40 MG tablet Take 1 tablet (40 mg total) by mouth daily. 12/25/22   Parker, Caleb M, MD  polyethylene glycol powder (GLYCOLAX /MIRALAX ) 17 GM/SCOOP powder Take 17 g by mouth daily. Patient taking differently: Take 17 g by mouth daily as needed for mild constipation or moderate constipation. 06/11/22   Kennyth Worth HERO, MD  potassium chloride  SA (KLOR-CON  M) 20 MEQ tablet Take 1 tablet (20 mEq total) by mouth daily. 08/04/22   Kennyth Worth HERO, MD  senna-docusate (SENOKOT-S) 8.6-50 MG tablet Take 1 tablet by mouth at bedtime as needed for mild constipation. 06/02/22   Milissa Tod PARAS, MD  sitaGLIPtin  (JANUVIA ) 100 MG tablet Take 1 tablet (100 mg total) by mouth daily. 12/25/22   Kennyth Worth HERO, MD      Allergies    Patient has no known allergies.    Review of Systems   Review of Systems  Physical Exam Updated Vital Signs BP (!) 128/56   Pulse 66   Temp 97.6 F (36.4 C) (Oral)   Resp 14   Ht 5' 2 (1.575 m)   Wt 78.5 kg   SpO2 99%   BMI 31.64 kg/m  Physical Exam Vitals and nursing note reviewed.  Constitutional:      General: She is not in acute distress.    Appearance: She is well-developed.  HENT:     Head: Normocephalic and atraumatic.  Eyes:     General: No visual field deficit.    Pupils: Pupils are equal, round, and reactive to light.     Comments: No obvious nystagmus however patient cannot hold eyes open consistently to rule out but does not appear to have extraocular movement deficit  Cardiovascular:     Rate and Rhythm: Normal rate and regular rhythm.     Heart sounds: Normal heart sounds. No murmur heard.    No friction rub.  Pulmonary:     Effort: Pulmonary effort is normal.     Breath sounds: Normal breath sounds. No wheezing or rales.  Abdominal:     General: Bowel sounds are normal. There is no  distension.     Palpations: Abdomen is soft.     Tenderness: There is no abdominal tenderness. There is no guarding or rebound.  Musculoskeletal:        General: No tenderness. Normal range of motion.     Comments: No edema  Skin:    General: Skin is warm and dry.     Findings: No rash.  Neurological:     Mental Status: She is alert and oriented to person, place, and time.  Cranial Nerves: No cranial nerve deficit, dysarthria or facial asymmetry.     Sensory: Sensation is intact.     Motor: No weakness or pronator drift.     Coordination: Finger-Nose-Finger Test abnormal and Heel to [place] Test abnormal.     Comments: Patient is unable to do finger-to-nose.  There is significant past-pointing and tremor when trying to use either hand or either foot  Psychiatric:        Behavior: Behavior normal.     ED Results / Procedures / Treatments   Labs (all labs ordered are listed, but only abnormal results are displayed) Labs Reviewed  COMPREHENSIVE METABOLIC PANEL - Abnormal; Notable for the following components:      Result Value   Sodium 134 (*)    CO2 20 (*)    Glucose, Bld 276 (*)    GFR, Estimated 57 (*)    All other components within normal limits  CBC - Abnormal; Notable for the following components:   WBC 19.0 (*)    RDW 15.7 (*)    All other components within normal limits  URINALYSIS, ROUTINE W REFLEX MICROSCOPIC - Abnormal; Notable for the following components:   Glucose, UA >=500 (*)    Hgb urine dipstick SMALL (*)    Protein, ur 100 (*)    All other components within normal limits  LIPASE, BLOOD    EKG EKG Interpretation Date/Time:  Tuesday January 26 2023 16:08:55 EST Ventricular Rate:  65 PR Interval:  174 QRS Duration:  100 QT Interval:  442 QTC Calculation: 459 R Axis:   -18  Text Interpretation: Normal sinus rhythm Nonspecific ST and T wave abnormality No significant change since last tracing When compared with ECG of 29-Aug-2022 11:45, PREVIOUS ECG IS  PRESENT Confirmed by Doretha Folks ([id number]) on 01/26/2023 5:58:03 PM  Radiology MR BRAIN WO CONTRAST Result Date: 01/27/2023 CLINICAL DATA:  Initial evaluation for acute neuro deficit, stroke suspected. EXAM: MRI HEAD WITHOUT CONTRAST TECHNIQUE: Multiplanar, multiecho pulse sequences of the brain and surrounding structures were obtained without intravenous contrast. COMPARISON:  Prior study from earlier the same day. FINDINGS: Brain: Mild age-related cerebral atrophy with chronic small vessel ischemic disease. No acute or subacute ischemia. Gray-white matter differentiation maintained. No areas of chronic cortical infarction. No acute intracranial hemorrhage. Single chronic microhemorrhage noted involving the left periatrial white matter. No mass lesion, midline shift or mass effect. No hydrocephalus or extra-axial fluid collection. Pituitary gland and suprasellar region within normal limits. Vascular: Hypoplastic right vertebral artery not well seen. Major intracranial vascular flow voids are otherwise maintained. Skull and upper cervical spine: Cranial junction within normal limits. Bone marrow signal intensity mildly heterogeneous but overall within normal limits. No scalp soft tissue abnormality. Sinuses/Orbits: Prior bilateral ocular lens replacement. Mild scattered mucosal thickening noted about the ethmoidal air cells and maxillary sinuses. No mastoid effusion. Other: None. IMPRESSION: 1. No acute intracranial abnormality. 2. Mild age-related cerebral atrophy with chronic small vessel ischemic disease. Electronically Signed   By: Morene Hoard M.D.   On: 01/27/2023 00:01   CT ANGIO HEAD NECK W WO CM Result Date: 01/26/2023 CLINICAL DATA:  Vertigo, nausea, blurred vision EXAM: CT ANGIOGRAPHY HEAD AND NECK WITH AND WITHOUT CONTRAST TECHNIQUE: Multidetector CT imaging of the head and neck was performed using the standard protocol during bolus administration of intravenous contrast. Multiplanar CT  image reconstructions and MIPs were obtained to evaluate the vascular anatomy. Carotid stenosis measurements (when applicable) are obtained utilizing NASCET criteria, using the distal internal carotid  diameter as the denominator. RADIATION DOSE REDUCTION: This exam was performed according to the departmental dose-optimization program which includes automated exposure control, adjustment of the mA and/or kV according to patient size and/or use of iterative reconstruction technique. CONTRAST:  75mL OMNIPAQUE  IOHEXOL  350 MG/ML SOLN COMPARISON:  08/29/2022 CT head, no prior CTA available FINDINGS: CT HEAD FINDINGS Brain: No evidence of acute infarct, hemorrhage, mass, mass effect, or midline shift. No hydrocephalus or extra-axial fluid collection. Vascular: No hyperdense vessel. Skull: Negative for fracture or focal lesion. Sinuses/Orbits: Mucosal thickening in the ethmoid air cells. No acute finding in the orbits. Status post bilateral lens replacements. Other: The mastoid air cells are well aerated. CTA NECK FINDINGS Aortic arch: Variant three-vessel arch, with a common origin of the right and left common carotid arteries, and an aberrant origin of the right subclavian, which passes posterior to the esophagus. No evidence of aneurysm or dissection in the imaged aorta. Right carotid system: No evidence of dissection, occlusion, or hemodynamically significant stenosis (greater than 50%). Retropharyngeal course of the proximal right ICA Left carotid system: No evidence of dissection, occlusion, or hemodynamically significant stenosis (greater than 50%). Vertebral arteries: Left dominant system. The left vertebral artery is patent from its origin to the skull base. The right vertebral artery is diminutive and minimally opacified throughout its course. Skeleton: No acute osseous abnormality. Degenerative changes in the cervical spine. Other neck: No acute finding. Upper chest: No focal pulmonary opacity or pleural  effusion. Review of the MIP images confirms the above findings CTA HEAD FINDINGS Anterior circulation: Both internal carotid arteries are patent to the termini, without significant stenosis. 2 mm anterior and superiorly directed outpouching from the left supraclinoid ICA (series 12, image 117 and series 13, image 166), favored to represent a tiny aneurysm A1 segments patent. Normal anterior communicating artery. Anterior cerebral arteries are patent to their distal aspects without significant stenosis. No M1 stenosis or occlusion. MCA branches perfused to their distal aspects without significant stenosis. Posterior circulation: The diminutive right vertebral artery primarily supplies the right PICA. The left vertebral artery is patent to the vertebrobasilar junction. The left PICA is patent proximally. Basilar patent to its distal aspect without significant stenosis. Superior cerebellar arteries patent proximally. Patent P1 segments. PCAs perfused to their distal aspects without significant stenosis. The bilateral posterior communicating arteries are patent. Venous sinuses: As permitted by contrast timing, patent. Anatomic variants: None significant. No evidence of vascular malformation. Review of the MIP images confirms the above findings IMPRESSION: 1. No acute intracranial process. 2. No intracranial large vessel occlusion or significant stenosis. 3. No hemodynamically significant stenosis in the neck. 4. 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 5. Variant three-vessel arch, with a common origin of the right and left common carotid arteries, and an aberrant origin of the right subclavian, which passes posterior to the esophagus. Electronically Signed   By: Donald Campion M.D.   On: 01/26/2023 19:33    Procedures Procedures    Medications Ordered in ED Medications  amLODipine  (NORVASC ) tablet 5 mg (has no administration in time  range)  atorvastatin  (LIPITOR) tablet 10 mg (has no administration in time range)  busPIRone  (BUSPAR ) tablet 10 mg (has no administration in time range)  donepezil  (ARICEPT ) tablet 10 mg (has no administration in time range)  DULoxetine  (CYMBALTA ) DR capsule 40 mg (has no administration in time range)  gabapentin  (NEURONTIN ) capsule 300 mg (has no administration in time range)  pantoprazole  (PROTONIX ) EC tablet 40 mg (has no administration in time range)  LORazepam  (ATIVAN ) injection 1 mg (1 mg Intravenous Given 01/26/23 1817)  lactated ringers  bolus 500 mL (0 mLs Intravenous Stopped 01/26/23 1931)  iohexol  (OMNIPAQUE ) 350 MG/ML injection 75 mL (75 mLs Intravenous Contrast Given 01/26/23 1918)  meclizine  (ANTIVERT ) tablet 25 mg (25 mg Oral Given 01/26/23 2054)  metoCLOPramide  (REGLAN ) injection 10 mg (10 mg Intravenous Given 01/26/23 2052)  acetaminophen  (TYLENOL ) tablet 1,000 mg (1,000 mg Oral Given 01/26/23 2052)    ED Course/ Medical Decision Making/ A&P                                 Medical Decision Making Amount and/or Complexity of Data Reviewed External Data Reviewed: notes. Labs: ordered. Decision-making details documented in ED Course. Radiology: ordered and independent interpretation performed. Decision-making details documented in ED Course. ECG/medicine tests: ordered and independent interpretation performed. Decision-making details documented in ED Course.  Risk OTC drugs. Prescription drug management.   Pt with multiple medical problems and comorbidities and presenting today with a complaint that caries a high risk for morbidity and mortality.  Here today with complaints of dizziness and vomiting.  Concern for dehydration, electrolyte abnormality, exacerbation of her known peripheral vertigo versus a more central cause as patient does have ataxia today.  Low suspicion for infectious etiology, anemia.  She is complaining of headache as well.  Will ensure no evidence of dissection of  the vertebral arteries or aneurysm.  Patient will be given Ativan  to help with her symptoms as she has ongoing vomiting here.  Will also give a fluid bolus.  I independently interpreted patient's labs and EKG.  EKG today did not show significant changes, CMP with normal renal function, LFTs and electrolytes.  Hemolysis was noted.  Lipase within normal limits, CBC today with a leukocytosis of 19 which may be acute phase reaction from the vomiting and spinning.  She has not complained of any acute infectious issues and has no abdominal findings on exam.  Patient symptoms started last night which would not make her a candidate for a code stroke given her dizziness and ataxia.  However will rule out.  I have independently visualized and interpreted pt's images today.  CT today without acute bleed.  Radiology reports no acute intracranial process, no large vessel occlusion or evidence of stenosis in the neck.  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  No evidence of dissection.  On repeat evaluation after IV fluids and Ativan  for dizziness patient reports feeling much better.  12:31 AM Attempting to ambulate pt and she did not do well.  She was very unsteady requiring a 2 person assist and reports she normal ambulates without assistance.  Also reported return of dizziness and HA with walking.  MRI to r/o stroke pending.  Pt given meclizine  and reglan /tylenol  for headache.   12:31 AM MRI is neg.  Pt states she is feeling better after meclizine .  She lives alone and will attempt to walk again and hopefully will be able to go home in the morning.  Will also attempt to po challenge.  Pt signed out ot Dr. Melvenia.        Final Clinical Impression(s) / ED Diagnoses Final diagnoses:  Vertigo    Rx / DC Orders ED Discharge Orders     None  Doretha Folks, MD 01/27/23 [id number]

## 2023-01-26 NOTE — Patient Instructions (Signed)
 Visit Information  Thank you for taking time to visit with me today. Please don't hesitate to contact me if I can be of assistance to you.   Following are the goals we discussed today:   Goals Addressed             This Visit's Progress    COMPLETED: CCM (DIABETES) EXPECTED OUTCOME: MONITOR, SELF-MANAGE AND REDUCE SYMPTOMS OF DIABETES       Current Barriers:  Knowledge Deficits related to Diabetes management Chronic Disease Management support and education needs related to Diabetes, diet Cognitive Deficits No Advanced Directives in place- pt requests documents be mailed Patient reports she lives alone, is independent with all aspects of her care, does not drive, brother and first cousin provide transportation Patient reports she checks CBG TID with fasting readings 100's range with recent readings 180 and 200, pt pleased AIC decreased to 7.2 on 09/25/22 (down from 8.0) Patient reports she has chronic anxiety, nervousness, no depression, is on medication for anxiety, states she is to see psychiatrist on 10/27/22 (not sure name of psychiatrist per pt)  pt feels seeing psychiatrist in person will help her the most, does not feel telephonic management by social worker would be as effective for her  Pharmacist is currently working with pt for medication adherence Patient denies headache or other pain today  Planned Interventions: Provided education to patient about basic DM disease process; Reviewed prescribed diet with patient carbohydrate modified; Counseled on importance of regular laboratory monitoring as prescribed;        Discussed plans with patient for ongoing care management follow up and provided patient with direct contact information for care management team;      Advised patient, providing education and rationale, to check cbg three times daily and record        call provider for findings outside established parameters;       Review of patient status, including review of  consultants reports, relevant laboratory and other test results, and medications completed;       Pain assessment completed  Symptom Management: Take medications as prescribed   Attend all scheduled provider appointments Call pharmacy for medication refills 3-7 days in advance of running out of medications Attend church or other social activities Perform all self care activities independently  Call provider office for new concerns or questions  check blood sugar at prescribed times: three times daily check feet daily for cuts, sores or redness enter blood sugar readings and medication or insulin  into daily log take the blood sugar log to all doctor visits take the blood sugar meter to all doctor visits trim toenails straight across fill half of plate with vegetables limit fast food meals to no more than 1 per week manage portion size read food labels for fat, fiber, carbohydrates and portion size set a realistic goal Advanced directives previously mailed- please look over and complete Psychiatry - please see as per scheduled appointment  Follow Up Plan: Telephone follow up appointment with care management team member scheduled for:  12/16/22 at 130 pm       COMPLETED: CCM (HYPERTENSION) EXPECTED OUTCOME: SELF-MANAGE AND REDUCE SYMPTOMS OF HYPERTENSION       Current Barriers:  Knowledge Deficits related to Hypertension management Care Coordination needs related to medication management in a patient with Hypertension Chronic Disease Management support and education needs related to Hypertension, diet Patient reports she has blood pressure cuff but does not monitor blood pressure, pt exercises some throughout the week, likes to  walk No new concerns reported  Planned Interventions: Evaluation of current treatment plan related to hypertension self management and patient's adherence to plan as established by provider;   Reviewed medications with patient and discussed importance of  compliance;  Counseled on the importance of exercise goals with target of 150 minutes per week Discussed plans with patient for ongoing care management follow up and provided patient with direct contact information for care management team; Advised patient, providing education and rationale, to monitor blood pressure daily and record, calling PCP for findings outside established parameters;  Discussed complications of poorly controlled blood pressure such as heart disease, stroke, circulatory complications, vision complications, kidney impairment, sexual dysfunction;  Reinforced low sodium diet  Symptom Management: Take medications as prescribed   Attend all scheduled provider appointments Call pharmacy for medication refills 3-7 days in advance of running out of medications Attend church or other social activities Perform all self care activities independently  Call provider office for new concerns or questions  call the Suicide and Crisis Lifeline: 988 call the [place]  National Suicide Prevention Lifeline: 513-884-1425 or TTY: 401-623-0481 TTY (304) 638-5940) to talk to a trained counselor call 1-800-273-TALK (toll free, 24 hour hotline) go to [facility] [address] [place] [phone]) call 911 if experiencing a Mental Health or Behavioral Health Crisis  check blood pressure 3 times per week choose a place to take my blood pressure (home, clinic or office, retail store) write blood pressure results in a log or diary learn about high blood pressure keep a blood pressure log take blood pressure log to all doctor appointments develop an action plan for high blood pressure take medications for blood pressure exactly as prescribed report new symptoms to your doctor eat more whole grains, fruits and vegetables, lean meats and healthy fats Follow low sodium diet- read food labels for sodium content  Follow Up Plan: Telephone follow up appointment  with care management team member scheduled for:  12/16/22 at 130 pm       Diabetes Management       Patient Goals/Self Care Activities: -Patient/Caregiver will take medications as prescribed   -Patient/Caregiver will attend all scheduled provider appointments -Patient/Caregiver will call provider office for new concerns or questions   -check blood sugar at prescribed times -check blood sugar if I feel it is too high or too low -record values and write them down take them to all doctor visits  -schedule appointment with eye doctor   Patient reports she is not feeling her best today as she has some pain in her head and some diarrhea.  She reports it started on yesterday.  Discussed contacting physician for worsening or lingering of symptoms.  Also discussed hydration and pain control.  She verbalized understanding.  Discussed diabetes management.  She states last sugar was 123.  Encouraged to continue diabetes management.  She reports that her blood pressure has been good as she checks it when she goes to the pharmacy. She states it has been around 140/70.  Reiterated HTN Management.  No RN CM concerns.         Our next appointment is by telephone on 02/23/23 at 100 pm  Please call the care guide team at [phone] if you need to cancel or reschedule your appointment.   If you are experiencing a Mental Health or Behavioral Health Crisis or need someone to talk to, please call the Suicide and Crisis Lifeline: 988   The patient verbalized understanding of instructions, educational  materials, and care plan provided today and DECLINED offer to receive copy of patient instructions, educational materials, and care plan.   The patient has been provided with contact information for the care management team and has been advised to call with any health related questions or concerns.   Reona Zendejas J Yancy Hascall, RN, MSN RN Care Manager [facility], Population Health Direct Dial:  [phone]  Fax: [phone] Website: [email or web address]

## 2023-01-26 NOTE — ED Triage Notes (Signed)
 PER EMS: pt is from home with c/o n/v/d and blurred vision, onset this morning around 0800. She started feeling last night before she went to bed. Denies any pain.  BP- 182/64, HR-52, RR-18, 98% RA, CBG-265

## 2023-01-26 NOTE — Patient Outreach (Signed)
  Care Coordination   Follow Up Visit Note   01/26/2023 Name: MIKENZIE MCCANNON MRN: [id number] DOB: May 25, 1935  Wells KATHEE Hummer is a 88 y.o. year old female who sees Kennyth Worth HERO, MD for primary care. I spoke with  Wells KATHEE Hummer by phone today.  What matters to the patients health and wellness today?  Getting over diarrhea and headache    Goals Addressed             This Visit's Progress    Diabetes Management       Patient Goals/Self Care Activities: -Patient/Caregiver will take medications as prescribed   -Patient/Caregiver will attend all scheduled provider appointments -Patient/Caregiver will call provider office for new concerns or questions   -check blood sugar at prescribed times -check blood sugar if I feel it is too high or too low -record values and write them down take them to all doctor visits  -schedule appointment with eye doctor   Patient reports she is not feeling her best today as she has some pain in her head and some diarrhea.  She reports it started on yesterday.  Discussed contacting physician for worsening or lingering of symptoms.  Also discussed hydration and pain control.  She verbalized understanding.  Discussed diabetes management.  She states last sugar was 123.  Encouraged to continue diabetes management.  She reports that her blood pressure has been good as she checks it when she goes to the pharmacy. She states it has been around 140/70.  Reiterated HTN Management.  No RN CM concerns.         SDOH assessments and interventions completed:  Yes     Care Coordination Interventions:  Yes, provided   Follow up plan: Follow up call scheduled for February    Encounter Outcome:  Patient Visit Completed   Parnika Tweten J Bernita Beckstrom, RN, MSN RN Care Manager [facility], Population Health Direct Dial: [phone]  Fax: [phone] Website: [email or web address]

## 2023-01-26 NOTE — ED Notes (Signed)
 Patient transported to CT

## 2023-01-27 ENCOUNTER — Other Ambulatory Visit: Payer: Self-pay | Admitting: Family Medicine

## 2023-01-27 ENCOUNTER — Encounter (HOSPITAL_COMMUNITY): Payer: Self-pay

## 2023-01-27 DIAGNOSIS — R42 Dizziness and giddiness: Secondary | ICD-10-CM | POA: Diagnosis not present

## 2023-01-27 MED ORDER — PROCHLORPERAZINE EDISYLATE 10 MG/2ML IJ SOLN
5.0000 mg | Freq: Once | INTRAMUSCULAR | Status: AC
  Administered 2023-01-27: 5 mg via INTRAVENOUS
  Filled 2023-01-27: qty 2

## 2023-01-27 MED ORDER — BUSPIRONE HCL 10 MG PO TABS
10.0000 mg | ORAL_TABLET | Freq: Two times a day (BID) | ORAL | Status: DC
  Administered 2023-01-27: 10 mg via ORAL
  Filled 2023-01-27 (×2): qty 1

## 2023-01-27 MED ORDER — DULOXETINE HCL 20 MG PO CPEP
40.0000 mg | ORAL_CAPSULE | Freq: Every day | ORAL | Status: DC
  Administered 2023-01-27: 40 mg via ORAL
  Filled 2023-01-27: qty 2

## 2023-01-27 MED ORDER — ACETAMINOPHEN 325 MG PO TABS
650.0000 mg | ORAL_TABLET | Freq: Once | ORAL | Status: AC
  Administered 2023-01-27: 650 mg via ORAL
  Filled 2023-01-27: qty 2

## 2023-01-27 MED ORDER — GABAPENTIN 300 MG PO CAPS
300.0000 mg | ORAL_CAPSULE | Freq: Three times a day (TID) | ORAL | Status: DC
  Administered 2023-01-27: 300 mg via ORAL
  Filled 2023-01-27: qty 1

## 2023-01-27 MED ORDER — KETOROLAC TROMETHAMINE 15 MG/ML IJ SOLN
15.0000 mg | Freq: Once | INTRAMUSCULAR | Status: AC
  Administered 2023-01-27: 15 mg via INTRAVENOUS
  Filled 2023-01-27: qty 1

## 2023-01-27 MED ORDER — PANTOPRAZOLE SODIUM 40 MG PO TBEC
40.0000 mg | DELAYED_RELEASE_TABLET | Freq: Every day | ORAL | Status: DC
  Administered 2023-01-27: 40 mg via ORAL
  Filled 2023-01-27: qty 1

## 2023-01-27 MED ORDER — PROCHLORPERAZINE EDISYLATE 10 MG/2ML IJ SOLN
10.0000 mg | Freq: Once | INTRAMUSCULAR | Status: DC
Start: 2023-01-27 — End: 2023-01-27

## 2023-01-27 MED ORDER — MECLIZINE HCL 25 MG PO TABS: 25.0000 mg | ORAL_TABLET | Freq: Three times a day (TID) | ORAL | 0 refills | Status: DC | PRN

## 2023-01-27 MED ORDER — DONEPEZIL HCL 10 MG PO TABS
10.0000 mg | ORAL_TABLET | Freq: Every day | ORAL | Status: DC
  Filled 2023-01-27: qty 1

## 2023-01-27 MED ORDER — DIPHENHYDRAMINE HCL 50 MG/ML IJ SOLN
12.5000 mg | Freq: Once | INTRAMUSCULAR | Status: AC
Start: 2023-01-27 — End: 2023-01-27
  Administered 2023-01-27: 12.5 mg via INTRAVENOUS
  Filled 2023-01-27: qty 1

## 2023-01-27 MED ORDER — ATORVASTATIN CALCIUM 10 MG PO TABS
10.0000 mg | ORAL_TABLET | Freq: Every day | ORAL | Status: DC
  Administered 2023-01-27: 10 mg via ORAL
  Filled 2023-01-27: qty 1

## 2023-01-27 MED ORDER — AMLODIPINE BESYLATE 5 MG PO TABS
5.0000 mg | ORAL_TABLET | Freq: Every day | ORAL | Status: DC
  Administered 2023-01-27: 5 mg via ORAL
  Filled 2023-01-27: qty 1

## 2023-01-27 MED ORDER — LACTATED RINGERS IV BOLUS
500.0000 mL | Freq: Once | INTRAVENOUS | Status: AC
  Administered 2023-01-27: 500 mL via INTRAVENOUS

## 2023-01-27 NOTE — Progress Notes (Signed)
 CSW made referral to [facility] at [facility] on [facility] for outpatient services. CSW added information to patient's AVS.  Edwin Dada, MSW, LCSW Transitions of Care  Clinical Social Worker II [phone]

## 2023-01-27 NOTE — Discharge Instructions (Addendum)
 Great news!  You were able to walk well this morning.  PT recommends seeing outpatient PT for help with this.  I have tried to order it for you.  You might need to have your family doc order it if your insurance doesn't take my order, if if my order didn't work. A referral was made to [facility] outpatient physical therapy office on [facility]. Please follow up with them [phone].

## 2023-01-27 NOTE — ED Provider Notes (Addendum)
 Care of patient assumed from Dr. Jearline.  This patient presented with dizziness, nausea, vomiting.  Workup has been reassuring.  Given meclizine  and Ativan . Improved after ativan .  Requires 2-person assist w/ambulation. Lives independently. Plan for DC in morning after trial of ambulation.   Physical Exam  BP 129/82   Pulse 65   Temp 97.6 F (36.4 C) (Oral)   Resp (!) 28   Ht 5' 2 (1.575 m)   Wt 78.5 kg   SpO2 98%   BMI 31.64 kg/m   Physical Exam Vitals and nursing note reviewed.  Constitutional:      General: She is not in acute distress.    Appearance: Normal appearance. She is well-developed. She is not ill-appearing, toxic-appearing or diaphoretic.  HENT:     Head: Normocephalic and atraumatic.     Right Ear: External ear normal.     Left Ear: External ear normal.     Nose: Nose normal.     Mouth/Throat:     Mouth: Mucous membranes are moist.  Eyes:     Extraocular Movements: Extraocular movements intact.     Conjunctiva/sclera: Conjunctivae normal.  Cardiovascular:     Rate and Rhythm: Normal rate and regular rhythm.  Pulmonary:     Effort: Pulmonary effort is normal. No respiratory distress.  Abdominal:     General: There is no distension.     Palpations: Abdomen is soft.  Musculoskeletal:        General: No swelling.     Cervical back: Normal range of motion and neck supple.  Skin:    General: Skin is warm and dry.  Neurological:     General: No focal deficit present.     Mental Status: She is alert and oriented to person, place, and time.  Psychiatric:        Mood and Affect: Mood normal.        Behavior: Behavior normal.     Procedures  Procedures  ED Course / MDM    Medical Decision Making Amount and/or Complexity of Data Reviewed Labs: ordered. Radiology: ordered.  Risk OTC drugs. Prescription drug management.   Patient remained in the ED resting overnight.  She did develop a headache and headache cocktail was ordered.  In the morning,  headache was improved.  She did try to walk.  She still feels generalized weakness.  She was offered PT evaluation and social work consult.  She stated I would love that.  This was ordered.  Patient to board in the ED pending these evaluations.       Melvenia Motto, MD 01/27/23 JEARLD    Melvenia Motto, MD 01/27/23 [phone]

## 2023-01-27 NOTE — ED Provider Notes (Signed)
 I assumed care of the patient at 0 800.  Briefly the patient is an 88 year old female who is awaiting PT evaluation this morning.  She has had chronic dizziness and was having trouble ambulating last night when she came into the hospital.  She was able to ambulate independently with PT and so they are recommending outpatient vestibular rehab.  I am not sure that I am able to order this is an ER physician but I placed an order into the computer.  I encouraged her to follow-up with her doctor in the office.   Emil Share, DO 01/27/23 1013

## 2023-01-27 NOTE — ED Notes (Signed)
 Pt ambulated with walker with steady gait. Pt reported no dizziness.

## 2023-01-27 NOTE — Evaluation (Signed)
 Physical Therapy Evaluation Patient Details Name: Casey [name] MRN: [id number] DOB: 06-01-1935 Today's Date: 01/27/2023  History of Present Illness  88 y.o. female presents to [facility] hospital on 01/26/2023 with dizziness, nausea and vomiting. Workup negative for acute processes. PMH includes HTN, HLD, DM, BLE edema, vertigo.  Clinical Impression  Pt presents to PT with reports of frequent dizziness and neck/head pain. Pt reports a history of dizziness for multiple years, with current worsening of symptoms along with associated headache and neck pain. Pt reports pain begins at posterior neck near occiput and often radiates around to frontal region. Vestibular assessment is difficult to accurately perform at this time as the pt has great difficult maintaining eyes open without her glasses present, and has very poor vision without her glasses. Results from vestibular assessment are listed below in General comments section of note. Pt is able to perform all mobility during session without physical assistance and ambulates with steady gait with support of RW. PT encourages the pt to utilize her walker for all out of bed mobility at this time. PT recommends outpatient vestibular PT for further workup once pt is able to retrieve her glasses.      If plan is discharge home, recommend the following: A little help with bathing/dressing/bathroom;Assistance with cooking/housework;Assist for transportation;Help with stairs or ramp for entrance   Can travel by private vehicle        Equipment Recommendations None recommended by PT  Recommendations for Other Services       Functional Status Assessment Patient has had a recent decline in their functional status and demonstrates the ability to make significant improvements in function in a reasonable and predictable amount of time.     Precautions / Restrictions Precautions Precautions: Fall Precaution Comments: dizziness Restrictions Weight Bearing  Restrictions Per Provider Order: No      Mobility  Bed Mobility Overal bed mobility: Needs Assistance Bed Mobility: Supine to Sit, Sit to Supine     Supine to sit: Supervision Sit to supine: Supervision        Transfers Overall transfer level: Needs assistance Equipment used: None Transfers: Sit to/from Stand Sit to Stand: Supervision                Ambulation/Gait Ambulation/Gait assistance: Supervision Gait Distance (Feet): 150 Feet Assistive device: Rolling walker (2 wheels) Gait Pattern/deviations: Step-through pattern Gait velocity: reduced Gait velocity interpretation: <1.31 ft/sec, indicative of household ambulator   General Gait Details: slowed step-through gait, no instability noted  Stairs            Wheelchair Mobility     Tilt Bed    Modified Rankin (Stroke Patients Only)       Balance Overall balance assessment: Needs assistance Sitting-balance support: No upper extremity supported, Feet supported Sitting balance-Leahy Scale: Good     Standing balance support: Single extremity supported, Reliant on assistive device for balance Standing balance-Leahy Scale: Poor                               Pertinent Vitals/Pain Pain Assessment Pain Assessment: 0-10 Pain Score: 10-Worst pain ever Pain Location: head Pain Descriptors / Indicators: Headache Pain Intervention(s): Monitored during session    Home Living Family/patient expects to be discharged to:: Private residence Living Arrangements: Alone Available Help at Discharge: Friend(s);Family;Available PRN/intermittently (sister, niece) Type of Home: House         Home Layout: One level Home Equipment: Agricultural Consultant (2  wheels);Cane - single point;BSC/3in1;Shower seat      Prior Function Prior Level of Function : Needs assist             Mobility Comments: ambulates in the home with [facility], utilizes a walker outside of the home ADLs Comments: friend assists  with transportation, sponge bathes at the sink, ind with ADLs and med mgmt     Extremity/Trunk Assessment   Upper Extremity Assessment Upper Extremity Assessment: Overall WFL for tasks assessed    Lower Extremity Assessment Lower Extremity Assessment: Overall WFL for tasks assessed    Cervical / Trunk Assessment Cervical / Trunk Assessment: Kyphotic  Communication   Communication Communication: No apparent difficulties Cueing Techniques: Verbal cues  Cognition Arousal: Alert Behavior During Therapy: WFL for tasks assessed/performed Overall Cognitive Status: Impaired/Different from baseline Area of Impairment: Memory                     Memory: Decreased short-term memory         General Comments: pt with difficulty describing onset of symptoms and neck pain        General Comments General comments (skin integrity, edema, etc.): VSS on RA. PT vestibular assessment: Difficult to accurately assess vision and vesitublar function as pt often closes eyes and has difficulty maintaining eyes open throughout session. Pt does not have her glasses with her and reports she has very poor vision without them. Pt reports dizziness and HA with horizontal VOR, only HA with vertical VOR. Roll test is negative bilaterally. Dix-hallpike positive for dizziness to L side, less significant dizziness to R. Pt reports a history of dizziness for multiple years, without relief from meclizine . This dizziness is worse currently than baseline. Pt reports her dizziness currently is commonly associated with HA.    Exercises     Assessment/Plan    PT Assessment Patient needs continued PT services  PT Problem List Decreased activity tolerance;Decreased balance;Decreased mobility;Decreased knowledge of use of DME       PT Treatment Interventions DME instruction;Gait training;Functional mobility training;Therapeutic exercise;Therapeutic activities;Balance training;Neuromuscular  re-education;Patient/family education    PT Goals (Current goals can be found in the Care Plan section)  Acute Rehab PT Goals Patient Stated Goal: to stop headache and dizziness PT Goal Formulation: With patient Time For Goal Achievement: 02/10/23 Potential to Achieve Goals: Fair    Frequency Min 1X/week     Co-evaluation               AM-PAC PT 6 Clicks Mobility  Outcome Measure Help needed turning from your back to your side while in a flat bed without using bedrails?: A Little Help needed moving from lying on your back to sitting on the side of a flat bed without using bedrails?: A Little Help needed moving to and from a bed to a chair (including a wheelchair)?: A Little Help needed standing up from a chair using your arms (e.g., wheelchair or bedside chair)?: A Little Help needed to walk in hospital room?: A Little Help needed climbing 3-5 steps with a railing? : A Lot 6 Click Score: 17    End of Session Equipment Utilized During Treatment: Gait belt Activity Tolerance: Patient tolerated treatment well Patient left: in bed;with call bell/phone within reach Nurse Communication: Mobility status PT Visit Diagnosis: Other abnormalities of gait and mobility (R26.89);Dizziness and giddiness (R42)    Time: 9160-9079 PT Time Calculation (min) (ACUTE ONLY): 41 min   Charges:   PT Evaluation $PT Eval Low Complexity:  1 Low   PT General Charges $$ ACUTE PT VISIT: 1 Visit         Bernardino JINNY Ruth, PT, DPT Acute Rehabilitation Office [phone]   Bernardino JINNY Ruth 01/27/2023, 9:34 AM

## 2023-02-03 ENCOUNTER — Other Ambulatory Visit: Payer: Self-pay | Admitting: Family Medicine

## 2023-02-03 DIAGNOSIS — M170 Bilateral primary osteoarthritis of knee: Secondary | ICD-10-CM

## 2023-02-03 MED ORDER — HYDROCODONE-ACETAMINOPHEN 5-325 MG PO TABS: 1.0000 | ORAL_TABLET | Freq: Three times a day (TID) | ORAL | 0 refills | Status: DC | PRN

## 2023-02-03 NOTE — Telephone Encounter (Signed)
 Copied from CRM [id number]. Topic: Clinical - Medication Refill >> Feb 03, 2023  8:54 AM Barney Boozer wrote: Most Recent Primary Care Visit:  Provider: Rodney Clamp  Department: [facility]  Visit Type: OFFICE VISIT  Date: 12/25/2022  Medication: HYDROcodone -acetaminophen  (NORCO/VICODIN) 5-325 MG tablet  Has the patient contacted their pharmacy? No (Agent: If no, request that the patient contact the pharmacy for the refill. If patient does not wish to contact the pharmacy document the reason why and proceed with request.) (Agent: If yes, when and what did the pharmacy advise?)  Is this the correct pharmacy for this prescription? Yes If no, delete pharmacy and type the correct one.  This is the patient's preferred pharmacy:   [facility] [address], [place] - [address] AT [address] [place] [address] Phone: [phone] Fax: [phone]   Has the prescription been filled recently? No  Is the patient out of the medication? Yes  Has the patient been seen for an appointment in the last year OR does the patient have an upcoming appointment? Yes  Can we respond through MyChart? Yes  Agent: Please be advised that Rx refills may take up to 3 business days. We ask that you follow-up with your pharmacy.

## 2023-02-12 ENCOUNTER — Other Ambulatory Visit: Payer: Self-pay | Admitting: Family Medicine

## 2023-02-19 ENCOUNTER — Other Ambulatory Visit: Payer: Self-pay

## 2023-02-19 ENCOUNTER — Emergency Department (HOSPITAL_COMMUNITY)
Admission: EM | Admit: 2023-02-19 | Discharge: 2023-02-20 | Disposition: A | Discharge status: Home / Self Care | Payer: 59 | Attending: Emergency Medicine | Admitting: Emergency Medicine

## 2023-02-19 DIAGNOSIS — R251 Tremor, unspecified: Secondary | ICD-10-CM | POA: Insufficient documentation

## 2023-02-19 DIAGNOSIS — R531 Weakness: Secondary | ICD-10-CM | POA: Diagnosis present

## 2023-02-19 DIAGNOSIS — R519 Headache, unspecified: Secondary | ICD-10-CM | POA: Insufficient documentation

## 2023-02-19 DIAGNOSIS — R42 Dizziness and giddiness: Secondary | ICD-10-CM | POA: Insufficient documentation

## 2023-02-19 DIAGNOSIS — Z7982 Long term (current) use of aspirin: Secondary | ICD-10-CM | POA: Diagnosis not present

## 2023-02-19 DIAGNOSIS — Z20822 Contact with and (suspected) exposure to covid-19: Secondary | ICD-10-CM | POA: Insufficient documentation

## 2023-02-19 LAB — COMPREHENSIVE METABOLIC PANEL
ALT: 41 U/L (ref 0–44)
AST: 45 U/L — ABNORMAL HIGH (ref 15–41)
Albumin: 3.9 g/dL (ref 3.5–5.0)
Alkaline Phosphatase: 76 U/L (ref 38–126)
Anion gap: 10 (ref 5–15)
BUN: 12 mg/dL (ref 8–23)
CO2: 27 mmol/L (ref 22–32)
Calcium: 9.6 mg/dL (ref 8.9–10.3)
Chloride: 103 mmol/L (ref 98–111)
Creatinine, Ser: 1.01 mg/dL — ABNORMAL HIGH (ref 0.44–1.00)
GFR, Estimated: 54 mL/min — ABNORMAL LOW (ref >60)
Glucose, Bld: 139 mg/dL — ABNORMAL HIGH (ref 70–99)
Potassium: 3.8 mmol/L (ref 3.5–5.1)
Sodium: 140 mmol/L (ref 135–145)
Total Bilirubin: 0.5 mg/dL (ref 0.0–1.2)
Total Protein: 7.1 g/dL (ref 6.5–8.1)

## 2023-02-19 LAB — CBC
HCT: 36 % (ref 36.0–46.0)
Hemoglobin: 11.8 g/dL — ABNORMAL LOW (ref 12.0–15.0)
MCH: 28.4 pg (ref 26.0–34.0)
MCHC: 32.8 g/dL (ref 30.0–36.0)
MCV: 86.7 fL (ref 80.0–100.0)
Platelets: 258 10*3/uL (ref 150–400)
RBC: 4.15 MIL/uL (ref 3.87–5.11)
RDW: 15.7 % — ABNORMAL HIGH (ref 11.5–15.5)
WBC: 8.5 10*3/uL (ref 4.0–10.5)
nRBC: 0 % (ref 0.0–0.2)

## 2023-02-19 LAB — RESP PANEL BY RT-PCR (RSV, FLU A&B, COVID)  RVPGX2
Influenza A by PCR: NEGATIVE
Influenza B by PCR: NEGATIVE
Resp Syncytial Virus by PCR: NEGATIVE
SARS Coronavirus 2 by RT PCR: NEGATIVE

## 2023-02-19 NOTE — ED Triage Notes (Signed)
Pt complains of headache, and bilateral side pain x 1 week. Also states she has been getting nervous and shaking this week. Denies any falls.

## 2023-02-19 NOTE — ED Provider Triage Note (Cosign Needed)
Emergency Medicine Provider Triage Evaluation Note  Casey [name] , a 88 y.o. female  was evaluated in triage.  Pt complains of weakness.  Review of Systems  Positive:  Negative:   Physical Exam  BP (!) 181/79 (BP Location: Right Arm)   Pulse 81   Temp 98.7 F (37.1 C)   Resp 18   SpO2 96%  Gen:   Awake, no distress   Resp:  Normal effort  MSK:   Moves extremities without difficulty  Other:   Medical Decision Making  Medically screening exam initiated at 6:22 PM.  Appropriate orders placed.  Casey [name] was informed that the remainder of the evaluation will be completed by another provider, this initial triage assessment does not replace that evaluation, and the importance of remaining in the ED until their evaluation is complete.  Weakness/nervous/shaking for the past week. Also coughing. Denies fever. One episode of vomiting today. Denies current nausea, diarrhea, dysuria, hematuria.    Casey [name], [place] 02/19/23 [phone]

## 2023-02-20 ENCOUNTER — Telehealth: Payer: Self-pay

## 2023-02-20 MED ORDER — ACETAMINOPHEN 325 MG PO TABS
650.0000 mg | ORAL_TABLET | Freq: Once | ORAL | Status: AC
  Administered 2023-02-20: 650 mg via ORAL
  Filled 2023-02-20: qty 2

## 2023-02-20 NOTE — ED Notes (Signed)
RN attempted to call pt contacts to arrange for a ride home for pt. Pt contacts listed in chart both went to voicemail. RN will attempt again

## 2023-02-20 NOTE — Discharge Instructions (Signed)
Your caregiver has seen you today because you are having problems with feelings of weakness, dizziness, and/or fatigue. Weakness has many different causes, some of which are common and others are very rare. Your caregiver has considered some of the most common causes of weakness and feels it is safe for you to go home and be observed. Not every illness or injury can be identified during an emergency department visit, thus follow-up with your primary healthcare provider is important. Medical conditions can also worsen, so it is also important to return immediately as directed below, or if you have other serious concerns develop. ° °RETURN IMMEDIATELY IF  °you develop new shortness of breath, chest pain, fever, have difficulty moving parts of your body (new weakness, numbness, or incoordination), sudden change in speech, vision, swallowing, or understanding, faint or develop new dizziness, severe headache, become poorly responsive or have an altered mental status compared to baseline for you, new rash, abdominal pain, or bloody stools,  °Return sooner also if you develop new problems for which you have not talked to your caregiver but you feel may be emergency medical conditions. ° °

## 2023-02-20 NOTE — ED Notes (Signed)
RN attempted to contact family to come get pt and was unable to get in contact with them

## 2023-02-20 NOTE — ED Provider Notes (Signed)
[place] EMERGENCY DEPARTMENT AT [facility] Provider Note   CSN: [id number] Arrival date & time: 02/19/23  1615     History  Chief Complaint  Patient presents with   Weakness    Casey [name] is a 88 y.o. female.  The history is provided by the patient.  Patient is an 88 year old female who reports everything is wrong with her. Patient reports multiple issues.  She has had ongoing headache for months.  She has frequent bouts of vertigo for several months.  No recent falls or trauma.  She has had recent coughing and occasional vomiting and diarrhea.  No chest pain or shortness of breath.  No fevers. No urinary symptoms are reported She also reports getting nervous and shaking a lot. Denies any recent change in her medicines.  Patient reports she lives alone but has a friend close by and her niece lives close by Past Medical History:  Diagnosis Date   ALLERGIC RHINITIS    Allergy    ANXIETY    ARM PAIN, RIGHT    BENIGN POSITIONAL VERTIGO    Carcinoma in situ of colon    CONSTIPATION    CYSTOCELE WITH INCOMPLETE UTERINE PROLAPSE    Diabetes mellitus without complication (HCC)    ESOPHAGEAL STRICTURE    FATIGUE    FLANK PAIN, LEFT    Headache(784.0)    HIP PAIN, LEFT    HYPERCHOLESTEROLEMIA    HYPERGLYCEMIA    HYPERTENSION, BENIGN ESSENTIAL 03/03/2007   LEG EDEMA, BILATERAL    Microscopic hematuria    OBESITY    OSTEOARTHRITIS, KNEE    OSTEOPOROSIS    Peripheral neuropathy, idiopathic    Problems with swallowing and mastication    SINUSITIS, RECURRENT    SPRAIN&STRAIN OTH SPEC SITES SHOULDER&UPPER ARM     Home Medications Prior to Admission medications   Medication Sig Start Date End Date Taking? Authorizing Provider  Accu-Chek Softclix Lancets lancets USE AS DIRECTED IN THE MORNING, AT NOON, AND AT BEDTIME 11/23/22   Ardith Dark, MD  amLODipine (NORVASC) 2.5 MG tablet Take 2 tablets (5 mg total) by mouth daily. 07/16/22   Ardith Dark, MD   aspirin 81 MG chewable tablet Chew 1 tablet (81 mg total) by mouth daily. 08/13/22   Ardith Dark, MD  atorvastatin (LIPITOR) 10 MG tablet Take 1 tablet (10 mg total) by mouth daily. 10/15/22   Ardith Dark, MD  azelastine (ASTELIN) 0.1 % nasal spray INSTILL 1 SPRAY INTO BOTH NOSTRILS TWICE DAILY 09/07/22   Ardith Dark, MD  azelastine (OPTIVAR) 0.05 % ophthalmic solution Place 1 drop into both eyes 2 (two) times daily.    [provider]  Blood Glucose Monitoring Suppl DEVI 1 each by Does not apply route in the morning, at noon, and at bedtime. May substitute to any manufacturer covered by patient's insurance.Dx E11.29, 06/17/22   Ardith Dark, MD  busPIRone (BUSPAR) 10 MG tablet Take 1 tablet (10 mg total) by mouth 2 (two) times daily. 09/25/22   Ardith Dark, MD  Carboxymethylcellul-Glycerin (REFRESH OPTIVE) 1-0.9 % GEL Place 1 drop into both eyes daily.    [provider]  donepezil (ARICEPT) 10 MG tablet TAKE 1 TABLET(10 MG) BY MOUTH AT BEDTIME 01/27/23   Ardith Dark, MD  DULoxetine (CYMBALTA) 20 MG capsule Take 2 capsules (40 mg total) by mouth daily. 12/01/22   Oneta Rack, NP  FLUoxetine (PROZAC) 20 MG tablet Take 1 tablet (20 mg total)  by mouth daily. 08/25/22   Ardith Dark, MD  fluticasone (FLONASE) 50 MCG/ACT nasal spray Place 1 spray into both nostrils daily for 7 days. 08/29/22 09/05/22  Linwood Dibbles, MD  furosemide (LASIX) 40 MG tablet TAKE 1 TABLET(40 MG) BY MOUTH DAILY 10/16/22   Ardith Dark, MD  gabapentin (NEURONTIN) 300 MG capsule Take 1 capsule (300 mg total) by mouth 3 (three) times daily. 08/25/22   Ardith Dark, MD  glucose blood (ACCU-CHEK GUIDE) test strip USE AS DIRECTED IN THE MORNING, AT NOON, AND AT BEDTIME 09/14/22   Ardith Dark, MD  HYDROcodone-acetaminophen (NORCO/VICODIN) 5-325 MG tablet Take 1 tablet by mouth every 8 (eight) hours as needed for moderate pain (pain score 4-6). For chronic pain 02/03/23   Ardith Dark, MD   hydrOXYzine (ATARAX) 10 MG tablet Take 1 tablet (10 mg total) by mouth 2 (two) times daily as needed for anxiety. May take 2 tablet ( 20 mg total) by mouth at night 10/27/22   Oneta Rack, NP  JARDIANCE 25 MG TABS tablet TAKE 1 TABLET(25 MG) BY MOUTH DAILY BEFORE BREAKFAST 02/15/23   Ardith Dark, MD  meclizine (ANTIVERT) 25 MG tablet Take 1 tablet (25 mg total) by mouth 3 (three) times daily as needed for dizziness. 01/27/23   Melene Plan, DO  Multiple Vitamins-Minerals (CERTAVITE/ANTIOXIDANTS) TABS Take 1 tablet by mouth at bedtime. 06/02/22   Drema Dallas, MD  pantoprazole (PROTONIX) 40 MG tablet Take 1 tablet (40 mg total) by mouth daily. 12/25/22   Ardith Dark, MD  polyethylene glycol powder (GLYCOLAX/MIRALAX) 17 GM/SCOOP powder Take 17 g by mouth daily. Patient taking differently: Take 17 g by mouth daily as needed for mild constipation or moderate constipation. 06/11/22   Ardith Dark, MD  potassium chloride SA (KLOR-CON M) 20 MEQ tablet Take 1 tablet (20 mEq total) by mouth daily. 08/04/22   Ardith Dark, MD  senna-docusate (SENOKOT-S) 8.6-50 MG tablet Take 1 tablet by mouth at bedtime as needed for mild constipation. 06/02/22   Drema Dallas, MD  sitaGLIPtin (JANUVIA) 100 MG tablet Take 1 tablet (100 mg total) by mouth daily. 12/25/22   Ardith Dark, MD      Allergies    Patient has no known allergies.    Review of Systems   Review of Systems  Constitutional:  Negative for fever.  Genitourinary:  Negative for dysuria.  Neurological:  Positive for dizziness and headaches.    Physical Exam Updated Vital Signs BP (!) 168/81 (BP Location: Left Arm)   Pulse 68   Temp 98.5 F (36.9 C) (Oral)   Resp 18   Ht 1.575 m (5\' 2" )   Wt 78.4 kg   SpO2 99%   BMI 31.61 kg/m  Physical Exam CONSTITUTIONAL: Elderly, no acute distress HEAD: Normocephalic/atraumatic EYES: EOMI/PERRL ENMT: Mucous membranes moist Bilateral TMs clear and intact NECK: supple no meningeal  signs CV: S1/S2 noted, no murmurs/rubs/gallops noted LUNGS: Lungs are clear to auscultation bilaterally, no apparent distress ABDOMEN: soft, nontender NEURO: Pt is awake/alert/appropriate, moves all extremitiesx4.  No facial droop.  No arm or leg drift.  Patient has a slow steady gait while using her cane EXTREMITIES: pulses normal/equal, full ROM, no deformities SKIN: warm, color normal   ED Results / Procedures / Treatments   Labs (all labs ordered are listed, but only abnormal results are displayed) Labs Reviewed  CBC - Abnormal; Notable for the following components:      Result  Value   Hemoglobin 11.8 (*)    RDW 15.7 (*)    All other components within normal limits  COMPREHENSIVE METABOLIC PANEL - Abnormal; Notable for the following components:   Glucose, Bld 139 (*)    Creatinine, Ser 1.01 (*)    AST 45 (*)    GFR, Estimated 54 (*)    All other components within normal limits  RESP PANEL BY RT-PCR (RSV, FLU A&B, COVID)  [id number]    EKG EKG Interpretation Date/Time:  Friday February 19 2023 18:37:31 EST Ventricular Rate:  70 PR Interval:  146 QRS Duration:  88 QT Interval:  454 QTC Calculation: 490 R Axis:   -25  Text Interpretation: Normal sinus rhythm Inferior infarct , age undetermined Possible Anterior infarct , age undetermined Abnormal ECG No significant change since last tracing Confirmed by Zadie Rhine ([id number]) on 02/20/2023 1:30:22 AM  Radiology No results found.  Procedures Procedures    Medications Ordered in ED Medications  acetaminophen (TYLENOL) tablet 650 mg (has no administration in time range)    ED Course/ Medical Decision Making/ A&P Clinical Course as of 02/20/23 0251  Sat Feb 20, 2023  0207 Patient presents with multiple vague complaints, reports everything is wrong.  Reports dizziness and headache for quite some time.  Recent MRIs were negative on previous ER visit.  Patient has no focal neurodeficits and she is able to ambulate with a  cane.  Labs are overall reassuring.  This appears to be a chronic issue.  Patient reports she used to get physical therapy at home, will reorder this.  Reports her legs give out due to weakness for several months, she would likely benefit from PT [DW]  0208 Overall patient is safe for discharge home. [DW]  0208   Will send messages to PCP [DW]  0250 Patient stable, multiple chronic issues but appears to be at her baseline. [DW]    Clinical Course User Index [DW] Zadie Rhine, MD                                 Medical Decision Making Risk OTC drugs.   This patient presents to the ED for concern of weakness and dizziness, this involves an extensive number of treatment options, and is a complaint that carries with it a high risk of complications and morbidity.  The differential diagnosis includes but is not limited to CVA, intracranial hemorrhage, acute coronary syndrome, renal failure, urinary tract infection, electrolyte disturbance, pneumonia, vertigo    Comorbidities that complicate the patient evaluation: Patient's presentation is complicated by their history of hypertension  Social Determinants of Health: Patient lives alone which increases the complexity of managing their presentation  Additional history obtained: Records reviewed Primary Care Documents  Lab Tests: I Ordered, and personally interpreted labs.  The pertinent results include: Labs overall unremarkable  Test Considered: Emergent neuroimaging considered, the patient is had symptoms for months with recent negative MRI  Reevaluation: After the interventions noted above, I reevaluated the patient and found that they have :stayed the same  Complexity of problems addressed: Patient's presentation is most consistent with  acute presentation with potential threat to life or bodily function  Disposition: After consideration of the diagnostic results and the patient's response to treatment,  I feel that the  patent would benefit from discharge   .           Final Clinical Impression(s) / ED Diagnoses Final diagnoses:  Weakness    Rx / DC Orders ED Discharge Orders     None         Zadie Rhine, MD 02/20/23 [phone]

## 2023-02-20 NOTE — ED Notes (Signed)
Pt states that she doesn't have a ride home and does not want to call her usual transportation contact r/t time of day.

## 2023-02-20 NOTE — ED Notes (Signed)
Pt verbalized understanding of discharge instructions. MD approved moving pt to waiting room. Pt states "you all did nothing for me here" .pt refused to attempt to call family for  them to pick her up.

## 2023-02-20 NOTE — Telephone Encounter (Signed)
Patient was seen in the ED for dizziness, she previously had home health and consented to receive it again. Previously had [facility] and wanted them back. Messaged Cory at [facility] for acceptance. Frances Furbish accepted and will call patient for scheduling

## 2023-02-23 ENCOUNTER — Ambulatory Visit: Payer: Self-pay

## 2023-02-23 ENCOUNTER — Other Ambulatory Visit: Payer: Self-pay | Admitting: Family Medicine

## 2023-02-23 NOTE — Patient Outreach (Signed)
  Care Coordination   02/23/2023 Name: Casey [name] MRN: [id number] DOB: 1935/07/06   Care Coordination Outreach Attempts:  An unsuccessful outreach was attempted for an appointment today.  Follow Up Plan:  Additional outreach attempts will be made to offer the patient complex care management information and services.   Encounter Outcome:  No Answer   Care Coordination Interventions:  No, not indicated     Lavaun DOROTHA Seeds RN, MSN [facility]  [facility], [facility] RN Care Manager Direct Dial: [phone]  Fax: [phone] Website: [email or web address]

## 2023-02-26 ENCOUNTER — Telehealth: Payer: Self-pay | Admitting: *Deleted

## 2023-02-26 NOTE — Telephone Encounter (Signed)
 Copied from CRM [id number]. Topic: General - Other >> Feb 26, 2023 10:45 AM Rosina BIRCH wrote: Reason for CRM: Jenna from [facility] called stating there will be a delay in the patient care for nursing and therapy. Start of care is 2/9 per client request and Jenna does not need a callback unless the doctor has questions   CB [phone]

## 2023-02-26 NOTE — Telephone Encounter (Signed)
 FYI, see message.

## 2023-03-01 ENCOUNTER — Telehealth: Payer: Self-pay | Admitting: *Deleted

## 2023-03-01 NOTE — Telephone Encounter (Signed)
 Copied from CRM [id number]. Topic: Clinical - Home Health Verbal Orders >> Mar 01, 2023  9:01 AM Dimple Francis wrote: Caller/Agency: [facility] Callback Number: [phone] Service Requested: Skilled Nursing Frequency: 2 times a week for 1 week and then 1 time a week for 3 weeks. Any new concerns about the patient? Yes ... Patient said is not taking and does not have the following  Januvia , Aspirin , Duloxetine  and hydroxyzine . Please reach out to [facility] to give VO? Areeb Corron,RMA

## 2023-03-01 NOTE — Telephone Encounter (Signed)
 Copied from CRM [id number]. Topic: Clinical - Home Health Verbal Orders >> Mar 01, 2023 10:23 AM Chuck Crater wrote: Caller/Agency: [facility]  Callback Number: [phone] Service Requested: Physical Therapy Frequency: 2x a week for 3 weeks and 1x a week for 2 weeks Send prescription for transfer bench and Relator (rolling walker) Any new concerns about the patient? No   Ok to give VO? Tivon Lemoine,RMA

## 2023-03-02 ENCOUNTER — Other Ambulatory Visit: Payer: Self-pay | Admitting: *Deleted

## 2023-03-02 DIAGNOSIS — R29898 Other symptoms and signs involving the musculoskeletal system: Secondary | ICD-10-CM

## 2023-03-02 NOTE — Telephone Encounter (Signed)
DME walker order placed and faxed to [phone]

## 2023-03-02 NOTE — Telephone Encounter (Signed)
Caller/Agency: [facility]  Callback Number: [phone] VO given

## 2023-03-02 NOTE — Telephone Encounter (Signed)
Ok with me. Please place any necessary orders.

## 2023-03-02 NOTE — Telephone Encounter (Signed)
Ok with me. Please place any necessary orders.  Please have her come in soon for medication(s) reconciliation with the pharmacy team or me.  Casey [name]. [name], MD 03/02/2023 7:24 AM

## 2023-03-03 ENCOUNTER — Other Ambulatory Visit: Payer: Self-pay | Admitting: Family Medicine

## 2023-03-03 DIAGNOSIS — M170 Bilateral primary osteoarthritis of knee: Secondary | ICD-10-CM

## 2023-03-03 NOTE — Telephone Encounter (Unsigned)
Copied from CRM [id number]. Topic: Clinical - Medication Refill >> Mar 03, 2023 12:40 PM Denese Killings wrote: Most Recent Primary Care Visit:  Provider: Ardith Dark  Department: [facility]  Visit Type: OFFICE VISIT  Date: 12/25/2022  Medication: HYDROcodone-acetaminophen (NORCO/VICODIN) 5-325 MG tablet  Has the patient contacted their pharmacy? No (Agent: If no, request that the patient contact the pharmacy for the refill. If patient does not wish to contact the pharmacy document the reason why and proceed with request.) (Agent: If yes, when and what did the pharmacy advise?)  Is this the correct pharmacy for this prescription? Yes If no, delete pharmacy and type the correct one.  This is the patient's preferred pharmacy:  [facility] [address], [place] - [address] AT [address] [place] [address] Phone: [phone] Fax: [phone]   Has the prescription been filled recently? yes  Is the patient out of the medication? Yes  Has the patient been seen for an appointment in the last year OR does the patient have an upcoming appointment? Yes  Can we respond through MyChart? No  Agent: Please be advised that Rx refills may take up to 3 business days. We ask that you follow-up with your pharmacy.

## 2023-03-04 MED ORDER — HYDROCODONE-ACETAMINOPHEN 5-325 MG PO TABS: 1.0000 | ORAL_TABLET | Freq: Three times a day (TID) | ORAL | 0 refills | Status: DC | PRN

## 2023-03-08 ENCOUNTER — Telehealth: Payer: Self-pay

## 2023-03-08 NOTE — Telephone Encounter (Signed)
 Copied from CRM [id number]. Topic: Clinical - Home Health Verbal Orders >> Mar 08, 2023  8:24 AM Fredrich Romans wrote: Caller/Agency: [facility] Callback Number: [phone]  Archie Patten From [facility] called stating that patient has  BPPV (vertigo) and she would like to which ear ,left right or both?  Patient seen in ED in January for Vertigo; looking at last OV note not seeing any specifics on left or right side. Please advise

## 2023-03-09 ENCOUNTER — Telehealth: Payer: Self-pay

## 2023-03-09 NOTE — Patient Instructions (Signed)
 Visit Information  Thank you for taking time to visit with me today. Please don't hesitate to contact me if I can be of assistance to you.   Following are the goals we discussed today:   Goals Addressed             This Visit's Progress    Diabetes Management       Patient Goals/Self Care Activities: -Patient/Caregiver will take medications as prescribed   -Patient/Caregiver will attend all scheduled provider appointments -Patient/Caregiver will call provider office for new concerns or questions   -check blood sugar at prescribed times -check blood sugar if I feel it is too high or too low -record values and write them down take them to all doctor visits  -schedule appointment with eye doctor   Patient reports she is doing okay.  She reports some problems with dizziness.  She has seen the ENT and has Antivert.  She is now working with therapy for balance and strengthening.  She states her sugars are good running in the low 100's(113,123, 125).    Reiterated diabetes management.  She reports that her blood pressure has been good.  She states that the therapist checks it every time they come to visit.    She states it has been less than 140/80.  Encouraged continued low salt diet.  No RN CM concerns.         Our next appointment is by telephone on 05/03/23 at 1045 am  Please call the care guide team at [phone] if you need to cancel or reschedule your appointment.   If you are experiencing a Mental Health or Behavioral Health Crisis or need someone to talk to, please call the Suicide and Crisis Lifeline: 988   The patient verbalized understanding of instructions, educational materials, and care plan provided today and DECLINED offer to receive copy of patient instructions, educational materials, and care plan.   The patient has been provided with contact information for the care management team and has been advised to call with any health related questions or concerns.   Bary Leriche RN, MSN [facility], [facility] RN Care Manager Direct Dial: [phone]  Fax: [phone] Website: [email or web address]

## 2023-03-09 NOTE — Telephone Encounter (Signed)
 Called  Casey [name] hh # Number: [phone] Information given Dx Rt side vertigo at ENT in 2023

## 2023-03-09 NOTE — Telephone Encounter (Signed)
 Diagnosed as right sided vertigo at ENT in 2023.  Casey [name]. [name], MD 03/09/2023 7:45 AM

## 2023-03-09 NOTE — Patient Outreach (Signed)
  Care Coordination   Follow Up Visit Note   03/09/2023 Name: Casey [name] MRN: [id number] DOB: Jun 17, 1935  Casey [name] is a 88 y.o. year old female who sees Ardith Dark, MD for primary care. I spoke with  Casey [name] by phone today.  What matters to the patients health and wellness today?  Maintaining health    Goals Addressed             This Visit's Progress    Diabetes Management       Patient Goals/Self Care Activities: -Patient/Caregiver will take medications as prescribed   -Patient/Caregiver will attend all scheduled provider appointments -Patient/Caregiver will call provider office for new concerns or questions   -check blood sugar at prescribed times -check blood sugar if I feel it is too high or too low -record values and write them down take them to all doctor visits  -schedule appointment with eye doctor   Patient reports she is doing okay.  She reports some problems with dizziness.  She has seen the ENT and has Antivert.  She is now working with therapy for balance and strengthening.  She states her sugars are good running in the low 100's(113,123, 125).    Reiterated diabetes management.  She reports that her blood pressure has been good.  She states that the therapist checks it every time they come to visit.    She states it has been less than 140/80.  Encouraged continued low salt diet.  No RN CM concerns.         SDOH assessments and interventions completed:  Yes  SDOH Interventions Today    Flowsheet Row Most Recent Value  SDOH Interventions   Food Insecurity Interventions Intervention Not Indicated  Housing Interventions Intervention Not Indicated  Transportation Interventions Intervention Not Indicated  Utilities Interventions Intervention Not Indicated        Care Coordination Interventions:  Yes, provided   Follow up plan: Follow up call scheduled for April    Encounter Outcome:  Patient Visit Completed   Bary Leriche RN,  MSN [facility], [facility] RN Care Manager Direct Dial: [phone]  Fax: [phone] Website: [email or web address]

## 2023-03-10 ENCOUNTER — Other Ambulatory Visit (HOSPITAL_COMMUNITY): Payer: Self-pay | Admitting: Family

## 2023-03-10 ENCOUNTER — Telehealth: Payer: Self-pay | Admitting: *Deleted

## 2023-03-10 ENCOUNTER — Other Ambulatory Visit: Payer: Self-pay | Admitting: Family Medicine

## 2023-03-10 NOTE — Telephone Encounter (Signed)
 Copied from CRM [id number]. Topic: Clinical - Medication Question >> Mar 10, 2023 10:51 AM [place] J wrote: Reason for CRM: April from [facility] calling in on behalf of the patient. April was requesting extra quantity of medication (meclizine (ANTIVERT) 25 MG tablet) since the one sent over to the pharmacy only had a quantity of 10 pills. If not possible, April was requesting that a different medication for dizziness gets prescribed because the patient stated that the meclizine does not help with her dizziness.   Please advise, RMA

## 2023-03-10 NOTE — Telephone Encounter (Signed)
 Copied from CRM [id number]. Topic: Referral - Request for Referral >> Mar 10, 2023 10:54 AM [place] J wrote: Did the patient discuss referral with their provider in the last year? No (If No - schedule appointment) (If Yes - send message)  Appointment offered? No  Type of order/referral and detailed reason for visit:   Neurologist  Preference of office, provider, location: ANY  If referral order, have you been seen by this specialty before? No (If Yes, this issue or another issue? When? Where?  Can we respond through MyChart? Yes   Please advisee, RMA

## 2023-03-11 ENCOUNTER — Telehealth: Payer: Self-pay | Admitting: Family Medicine

## 2023-03-11 NOTE — Telephone Encounter (Signed)
 Placed in PCP office to be reviewed

## 2023-03-11 NOTE — Telephone Encounter (Signed)
 Received faxed  document Home Health Certificate (Order ID [id number]), to be filled out by provider. Patient requested to send it back via Fax within 5-days. Document is located in providers tray at front office.Please advise

## 2023-03-12 NOTE — Telephone Encounter (Signed)
 Faxed over form to [phone] to [facility].

## 2023-03-26 ENCOUNTER — Ambulatory Visit: Payer: 59 | Admitting: Family Medicine

## 2023-03-26 ENCOUNTER — Encounter: Payer: Self-pay | Admitting: Family Medicine

## 2023-03-26 VITALS — BP 130/70 | HR 72 | Temp 98.2°F | Ht 62.0 in | Wt 184.0 lb

## 2023-03-26 DIAGNOSIS — Z7984 Long term (current) use of oral hypoglycemic drugs: Secondary | ICD-10-CM | POA: Diagnosis not present

## 2023-03-26 DIAGNOSIS — I152 Hypertension secondary to endocrine disorders: Secondary | ICD-10-CM

## 2023-03-26 DIAGNOSIS — E1129 Type 2 diabetes mellitus with other diabetic kidney complication: Secondary | ICD-10-CM | POA: Diagnosis not present

## 2023-03-26 DIAGNOSIS — H8110 Benign paroxysmal vertigo, unspecified ear: Secondary | ICD-10-CM

## 2023-03-26 DIAGNOSIS — R809 Proteinuria, unspecified: Secondary | ICD-10-CM | POA: Diagnosis not present

## 2023-03-26 DIAGNOSIS — F411 Generalized anxiety disorder: Secondary | ICD-10-CM | POA: Diagnosis not present

## 2023-03-26 DIAGNOSIS — E1159 Type 2 diabetes mellitus with other circulatory complications: Secondary | ICD-10-CM

## 2023-03-26 DIAGNOSIS — Z91148 Patient's other noncompliance with medication regimen for other reason: Secondary | ICD-10-CM

## 2023-03-26 DIAGNOSIS — R5381 Other malaise: Secondary | ICD-10-CM

## 2023-03-26 LAB — POCT GLYCOSYLATED HEMOGLOBIN (HGB A1C): Hemoglobin A1C: 7.4 % — AB (ref 4.0–5.6)

## 2023-03-26 MED ORDER — SITAGLIPTIN PHOSPHATE 100 MG PO TABS: 100.0000 mg | ORAL_TABLET | Freq: Every day | ORAL | 3 refills | Status: DC

## 2023-03-26 MED ORDER — FLUOXETINE HCL 20 MG PO TABS: 20.0000 mg | ORAL_TABLET | Freq: Every day | ORAL | 3 refills | Status: AC

## 2023-03-26 NOTE — Assessment & Plan Note (Signed)
 Working with home health PT and OT which is working well.

## 2023-03-26 NOTE — Assessment & Plan Note (Signed)
 Had extensive discussion with patient today regarding her medications.  It does seem like her compliance has improved recently as she is having a home health nurse come out once per week to assist her with this.  She has also seen a clinical pharmacist with our team to help her with this as well.  She states that they are still calling her routinely to check in.  She will let us know if she needs any further assistance.

## 2023-03-26 NOTE — Progress Notes (Signed)
 Casey [name] is a 88 y.o. female who presents today for an office visit.  Assessment/Plan:  Chronic Problems Addressed Today: Type 2 diabetes mellitus with microalbuminuria, without long-term current use of insulin (HCC) A1c overall stable 7.4 though is having some elevated home readings.  She was on Januvia however her pharmacy quit filling this for her.  She has been consistent with Jardiance 25 mg daily.  Will restart her Januvia 100 mg daily.  Continue her current dose of Jardiance.  She is aware of potential side effects.  Recheck A1c in 3 months.  Poor compliance with medication Had extensive discussion with patient today regarding her medications.  It does seem like her compliance has improved recently as she is having a home health nurse come out once per week to assist her with this.  She has also seen a clinical pharmacist with our team to help her with this as well.  She states that they are still calling her routinely to check in.  She will let us know if she needs any further assistance.   GAD (generalized anxiety disorder) She has saw psychiatry for this a few months ago.  She is currently on Prozac 20 mg daily, BuSpar 10 mg twice daily,and  gabapentin 300 mg 3 times daily.  She does not feel like her symptoms are adequately controlled on this regimen.  Will place referral for her to establish with a different psychiatry group.  As above we discussed importance of medication adherence.  Will follow-up here again in 3 months if she cannot see psychiatry by then.  BENIGN POSITIONAL VERTIGO Improved since her recent ED visits.  She is working with home health PT and OT which is going well.  Hypertension associated with diabetes (HCC) At goal today.  Continue amlodipine 5 mg daily.  Debility Working with home health PT and OT which is working well.     Subjective:  HPI:  Patient here today for follow-up.  I last saw her 3 months ago.  At our last visit her A1c was stable at  7.3.  We continued her on Jardiance 25 mg daily and added on Januvia 100 mg daily. She is doing well today. No acute concerns. Since our last visit she was in the Emergency Department twice for weakness and vertigo.  Workup there was negative.  She was started on meclizine and referred to physical therapy.  Symptoms have improved.  She is continue to work with home health PT and OT.  Also having a home health nurse come out once a week to help her with medication assistance.  She notes that the pharmacy stopped filling her Januvia but she has been consistent with her other medications.  She is still having quite a bit of anxiety.  We did refer her to see a psychiatrist for this several months ago.  She saw them once and they continues her own her current medications.  She would like to be referred again.       Objective:  Physical Exam: BP 130/70 (BP Location: Left Arm, Patient Position: Sitting, Cuff Size: Large)   Pulse 72   Temp 98.2 F (36.8 C) (Temporal)   Ht 5\' 2"  (1.575 m)   Wt 184 lb (83.5 kg)   SpO2 97%   BMI 33.65 kg/m   Gen: No acute distress, resting comfortably CV: Regular rate and rhythm with no murmurs appreciated Pulm: Normal work of breathing, clear to auscultation bilaterally with no crackles, wheezes, or rhonchi Neuro:  Grossly normal, moves all extremities Psych: Normal affect and thought content      Chad Donoghue M. [name], MD 03/26/2023 11:44 AM

## 2023-03-26 NOTE — Assessment & Plan Note (Signed)
 Improved since her recent ED visits.  She is working with home health PT and OT which is going well.

## 2023-03-26 NOTE — Assessment & Plan Note (Signed)
 A1c overall stable 7.4 though is having some elevated home readings.  She was on Januvia however her pharmacy quit filling this for her.  She has been consistent with Jardiance 25 mg daily.  Will restart her Januvia 100 mg daily.  Continue her current dose of Jardiance.  She is aware of potential side effects.  Recheck A1c in 3 months.

## 2023-03-26 NOTE — Patient Instructions (Signed)
 It was very nice to see you today!  Your A1c today is 7.4.  I will refill your medications.  Please restart the Januvia.  I will also refer you to see the psychiatrist.  Return in about 3 months (around 06/26/2023) for Follow Up.   Take care, Dr [name]  PLEASE NOTE:  If you had any lab tests, please let us know if you have not heard back within a few days. You may see your results on mychart before we have a chance to review them but we will give you a call once they are reviewed by [name].   If we ordered any referrals today, please let us know if you have not heard from their office within the next week.   If you had any urgent prescriptions sent in today, please check with the pharmacy within an hour of our visit to make sure the prescription was transmitted appropriately.   Please try these tips to maintain a healthy lifestyle:  Eat at least 3 REAL meals and 1-2 snacks per day.  Aim for no more than 5 hours between eating.  If you eat breakfast, please do so within one hour of getting up.   Each meal should contain half fruits/vegetables, one quarter protein, and one quarter carbs (no bigger than a computer mouse)  Cut down on sweet beverages. This includes juice, soda, and sweet tea.   Drink at least 1 glass of water with each meal and aim for at least 8 glasses per day  Exercise at least 150 minutes every week.

## 2023-03-26 NOTE — Assessment & Plan Note (Signed)
 She has saw psychiatry for this a few months ago.  She is currently on Prozac 20 mg daily, BuSpar 10 mg twice daily,and  gabapentin 300 mg 3 times daily.  She does not feel like her symptoms are adequately controlled on this regimen.  Will place referral for her to establish with a different psychiatry group.  As above we discussed importance of medication adherence.  Will follow-up here again in 3 months if she cannot see psychiatry by then.

## 2023-03-26 NOTE — Assessment & Plan Note (Signed)
At goal today.  Continue amlodipine 5 mg daily.

## 2023-03-31 ENCOUNTER — Telehealth: Payer: Self-pay

## 2023-03-31 ENCOUNTER — Other Ambulatory Visit: Payer: Self-pay | Admitting: Family Medicine

## 2023-03-31 NOTE — Telephone Encounter (Signed)
 Pt requesting refill for Meclizine 25 mg 3 times a day prn. Please advise if okay to fill?

## 2023-03-31 NOTE — Telephone Encounter (Signed)
 [facility] Home Health Orders Order Number: [id number] Faxed signed orders on 03/31/23 Confirmation received, papers placed on Stella's desk

## 2023-04-01 ENCOUNTER — Other Ambulatory Visit: Payer: Self-pay | Admitting: Family Medicine

## 2023-04-01 DIAGNOSIS — M170 Bilateral primary osteoarthritis of knee: Secondary | ICD-10-CM

## 2023-04-01 NOTE — Telephone Encounter (Signed)
 Copied from CRM [id number]. Topic: Clinical - Medication Refill >> Apr 01, 2023 12:11 PM Eunice Blase wrote: Most Recent Primary Care Visit:  Provider: Ardith Dark  Department: [facility]  Visit Type: OFFICE VISIT  Date: 03/26/2023  Medication: HYDROcodone-acetaminophen (NORCO/VICODIN) 5-325 MG tablet  Has the patient contacted their pharmacy? Yes (Agent: If no, request that the patient contact the pharmacy for the refill. If patient does not wish to contact the pharmacy document the reason why and proceed with request.) (Agent: If yes, when and what did the pharmacy advise?)Pharmacy need PCP approval  Is this the correct pharmacy for this prescription? Yes If no, delete pharmacy and type the correct one.  This is the patient's preferred pharmacy:  [facility] [address] [place] - [address] [place] [address] Phone: [phone] Fax: [phone]    Has the prescription been filled recently? Yes  Is the patient out of the medication? Yes  Has the patient been seen for an appointment in the last year OR does the patient have an upcoming appointment? Yes  Can we respond through MyChart? No  Agent: Please be advised that Rx refills may take up to 3 business days. We ask that you follow-up with your pharmacy.

## 2023-04-02 MED ORDER — HYDROCODONE-ACETAMINOPHEN 5-325 MG PO TABS
1.0000 | ORAL_TABLET | Freq: Three times a day (TID) | ORAL | 0 refills | Status: DC | PRN
Start: 2023-04-02 — End: 2023-05-05

## 2023-04-19 ENCOUNTER — Telehealth: Payer: Self-pay

## 2023-04-19 NOTE — Patient Outreach (Signed)
 Care Coordination   Follow Up Visit Note   04/19/2023 Name: Casey [name] MRN: [id number] DOB: 1936/01/16  Casey [name] is a 88 y.o. year old female who sees Ardith Dark, MD for primary care. I spoke with  Casey [name] by phone today.  What matters to the patients health and wellness today?  Maintain health    Goals Addressed             This Visit's Progress    Diabetes Management and HTN       Patient Goals/Self Care Activities: -Patient/Caregiver will take medications as prescribed   -Patient/Caregiver will attend all scheduled provider appointments -Patient/Caregiver will call provider office for new concerns or questions   -Calls provider office for new concerns, questions, or BP outside discussed parameters -Follows a low sodium diet/DASH diet -check blood sugar at prescribed times -check blood sugar if I feel it is too high or too low -record values and write them down take them to all doctor visits  -schedule appointment with eye doctor   Patient reports she is doing okay.  She states her last blood sugar was 147.  Discussed diabetes management.  She reports that her blood pressure has been good.  She states that the home health nurse checks it every time she comes to visit.    She states it has been less than 140/80.  Encouraged continued low salt diet.           SDOH assessments and interventions completed:  Yes     Care Coordination Interventions:  Yes, provided   Follow up plan: Follow up call scheduled for April    Encounter Outcome:  Patient Visit Completed   Bary Leriche RN, MSN [facility], [facility] RN Care Manager Direct Dial: [phone]  Fax: [phone] Website: [email or web address]

## 2023-04-19 NOTE — Patient Instructions (Signed)
 Visit Information  Thank you for taking time to visit with me today. Please don't hesitate to contact me if I can be of assistance to you.   Following are the goals we discussed today:   Goals Addressed             This Visit's Progress    Diabetes Management and HTN       Patient Goals/Self Care Activities: -Patient/Caregiver will take medications as prescribed   -Patient/Caregiver will attend all scheduled provider appointments -Patient/Caregiver will call provider office for new concerns or questions   -Calls provider office for new concerns, questions, or BP outside discussed parameters -Follows a low sodium diet/DASH diet -check blood sugar at prescribed times -check blood sugar if I feel it is too high or too low -record values and write them down take them to all doctor visits  -schedule appointment with eye doctor   Patient reports she is doing okay.  She states her last blood sugar was 147.  Discussed diabetes management.  She reports that her blood pressure has been good.  She states that the home health nurse checks it every time she comes to visit.    She states it has been less than 140/80.  Encouraged continued low salt diet.           Our next appointment is by telephone on 05/17/23 at 1000  Please call the care guide team at [phone] if you need to cancel or reschedule your appointment.   If you are experiencing a Mental Health or Behavioral Health Crisis or need someone to talk to, please call the Suicide and Crisis Lifeline: 988   The patient verbalized understanding of instructions, educational materials, and care plan provided today and DECLINED offer to receive copy of patient instructions, educational materials, and care plan.   The patient has been provided with contact information for the care management team and has been advised to call with any health related questions or concerns.   Bary Leriche RN, MSN [facility], [facility] RN Care Manager Direct Dial: [phone]  Fax: [phone] Website: [email or web address]

## 2023-04-21 ENCOUNTER — Ambulatory Visit: Payer: 59 | Admitting: Adult Health

## 2023-04-28 ENCOUNTER — Other Ambulatory Visit: Payer: Self-pay | Admitting: *Deleted

## 2023-04-28 MED ORDER — MECLIZINE HCL 25 MG PO TABS: 25.0000 mg | ORAL_TABLET | Freq: Three times a day (TID) | ORAL | 0 refills | Status: DC | PRN

## 2023-04-29 ENCOUNTER — Ambulatory Visit (INDEPENDENT_AMBULATORY_CARE_PROVIDER_SITE_OTHER)

## 2023-04-29 VITALS — Ht 62.0 in | Wt 184.0 lb

## 2023-04-29 DIAGNOSIS — Z0000 Encounter for general adult medical examination without abnormal findings: Secondary | ICD-10-CM

## 2023-04-29 NOTE — Progress Notes (Signed)
 Subjective:   Casey [name] is a 88 y.o. who presents for a Medicare Wellness preventive visit.  Visit Complete: Virtual I connected with  [name] on 04/29/23 by a audio enabled telemedicine application and verified that I am speaking with the correct person using two identifiers.  Patient Location: Home  Provider Location: Office/Clinic  I discussed the limitations of evaluation and management by telemedicine. The patient expressed understanding and agreed to proceed.  Vital Signs: Because this visit was a virtual/telehealth visit, some criteria may be missing or patient reported. Any vitals not documented were not able to be obtained and vitals that have been documented are patient reported.  VideoDeclined- This patient declined Librarian, academic. Therefore the visit was completed with audio only.  Persons Participating in Visit: Patient.  AWV Questionnaire: No: Patient Medicare AWV questionnaire was not completed prior to this visit.  Cardiac Risk Factors include: advanced age (>26men, >62 women);diabetes mellitus;dyslipidemia;hypertension;obesity (BMI >30kg/m2)     Objective:    Today's Vitals   04/29/23 0757  Weight: 184 lb (83.5 kg)  Height: 5\' 2"  (1.575 m)  PainSc: 9    Body mass index is 33.65 kg/m.     04/29/2023    8:05 AM 02/19/2023    6:26 PM 01/26/2023    3:32 PM 08/13/2022   10:09 AM 05/29/2022    4:47 PM 05/29/2022    4:10 PM 05/03/2022   11:34 PM  Advanced Directives  Does Patient Have a Medical Advance Directive? No No No No No No No  Would patient like information on creating a medical advance directive? No - Patient declined No - Patient declined No - Patient declined Yes (MAU/Ambulatory/Procedural Areas - Information given) No - Patient declined No - Patient declined     Current Medications (verified) Outpatient Encounter Medications as of 04/29/2023  Medication Sig   Accu-Chek Softclix Lancets lancets USE AS  DIRECTED IN THE MORNING, AT NOON, AND AT BEDTIME   amLODipine (NORVASC) 2.5 MG tablet Take 2 tablets (5 mg total) by mouth daily.   atorvastatin (LIPITOR) 10 MG tablet Take 1 tablet (10 mg total) by mouth daily.   azelastine (ASTELIN) 0.1 % nasal spray INSTILL 1 SPRAY INTO BOTH NOSTRILS TWICE DAILY   azelastine (OPTIVAR) 0.05 % ophthalmic solution Place 1 drop into both eyes 2 (two) times daily.   Blood Glucose Monitoring Suppl DEVI 1 each by Does not apply route in the morning, at noon, and at bedtime. May substitute to any manufacturer covered by patient's insurance.Dx E11.29,   busPIRone (BUSPAR) 10 MG tablet Take 1 tablet (10 mg total) by mouth 2 (two) times daily.   Carboxymethylcellul-Glycerin (REFRESH OPTIVE) 1-0.9 % GEL Place 1 drop into both eyes daily.   donepezil (ARICEPT) 10 MG tablet TAKE 1 TABLET(10 MG) BY MOUTH AT BEDTIME   FLUoxetine (PROZAC) 20 MG tablet Take 1 tablet (20 mg total) by mouth daily.   furosemide (LASIX) 40 MG tablet TAKE 1 TABLET(40 MG) BY MOUTH DAILY   gabapentin (NEURONTIN) 300 MG capsule TAKE 1 CAPSULE(300 MG) BY MOUTH THREE TIMES DAILY   glucose blood (ACCU-CHEK GUIDE) test strip USE AS DIRECTED IN THE MORNING, AT NOON, AND AT BEDTIME   HYDROcodone-acetaminophen (NORCO/VICODIN) 5-325 MG tablet Take 1 tablet by mouth every 8 (eight) hours as needed for moderate pain (pain score 4-6). For chronic pain   JARDIANCE 25 MG TABS tablet TAKE 1 TABLET(25 MG) BY MOUTH DAILY BEFORE BREAKFAST   meclizine (ANTIVERT) 25 MG tablet  Take 1 tablet (25 mg total) by mouth 3 (three) times daily as needed for dizziness.   Multiple Vitamins-Minerals (CERTAVITE/ANTIOXIDANTS) TABS Take 1 tablet by mouth at bedtime.   pantoprazole (PROTONIX) 40 MG tablet Take 1 tablet (40 mg total) by mouth daily.   polyethylene glycol powder (GLYCOLAX/MIRALAX) 17 GM/SCOOP powder Take 17 g by mouth daily. (Patient taking differently: Take 17 g by mouth daily as needed for mild constipation or moderate  constipation.)   potassium chloride SA (KLOR-CON M) 20 MEQ tablet TAKE 1 TABLET(20 MEQ) BY MOUTH DAILY   QUEtiapine (SEROQUEL) 50 MG tablet Take 100 mg by mouth at bedtime.   senna-docusate (SENOKOT-S) 8.6-50 MG tablet Take 1 tablet by mouth at bedtime as needed for mild constipation.   sitaGLIPtin (JANUVIA) 100 MG tablet Take 1 tablet (100 mg total) by mouth daily.   fluticasone (FLONASE) 50 MCG/ACT nasal spray Place 1 spray into both nostrils daily for 7 days.   No facility-administered encounter medications on file as of 04/29/2023.    Allergies (verified) Patient has no known allergies.   History: Past Medical History:  Diagnosis Date   ALLERGIC RHINITIS    Allergy    ANXIETY    ARM PAIN, RIGHT    BENIGN POSITIONAL VERTIGO    Carcinoma in situ of colon    CONSTIPATION    CYSTOCELE WITH INCOMPLETE UTERINE PROLAPSE    Diabetes mellitus without complication (HCC)    ESOPHAGEAL STRICTURE    FATIGUE    FLANK PAIN, LEFT    Headache(784.0)    HIP PAIN, LEFT    HYPERCHOLESTEROLEMIA    HYPERGLYCEMIA    HYPERTENSION, BENIGN ESSENTIAL 03/03/2007   LEG EDEMA, BILATERAL    Microscopic hematuria    OBESITY    OSTEOARTHRITIS, KNEE    OSTEOPOROSIS    Peripheral neuropathy, idiopathic    Problems with swallowing and mastication    SINUSITIS, RECURRENT    SPRAIN&STRAIN OTH SPEC SITES SHOULDER&UPPER ARM    Past Surgical History:  Procedure Laterality Date   BIOPSY  02/06/2022   Procedure: BIOPSY;  Surgeon: Kathi Der, MD;  Location: [facility];  Service: Gastroenterology;;   COLONOSCOPY     ESOPHAGOGASTRODUODENOSCOPY (EGD) WITH PROPOFOL N/A 02/06/2022   Procedure: ESOPHAGOGASTRODUODENOSCOPY (EGD) WITH PROPOFOL;  Surgeon: Kathi Der, MD;  Location: [facility];  Service: Gastroenterology;  Laterality: N/A;   KNEE ARTHROPLASTY Right 07/24/2020   Procedure: COMPUTER ASSISTED TOTAL KNEE ARTHROPLASTY;  Surgeon: Samson Frederic, MD;  Location: [facility];  Service: Orthopedics;   Laterality: Right;   LEFT HEART CATHETERIZATION WITH CORONARY ANGIOGRAM N/A 12/02/2010   Procedure: LEFT HEART CATHETERIZATION WITH CORONARY ANGIOGRAM;  Surgeon: Peter M Swaziland, MD;  Location: [facility];  Service: Cardiovascular;:: NORMAL CORONARY ARTERIES - NORMAL LV FUNCTION & EDP.     TRANSTHORACIC ECHOCARDIOGRAM  11/2010   Normal EF 60-65%. No RWMA.  Mild LAD dilation c/w Gr 1 DD.  -- PRETTY NORMAL ECHO   Family History  Problem Relation Age of Onset   Coronary artery disease Mother 80   Cancer Father 72   Heart disease Father    Colon cancer Other    Diabetes Other    Coronary artery disease Sister    Diabetes type II Sister    Cancer Sister    Coronary artery disease Brother    Kidney disease Brother    Coronary artery disease Brother    Breast cancer Neg Hx    Social History   Socioeconomic History   Marital status: Single  Spouse name: Not on file   Number of children: 1   Years of education: 45   Highest education level: High school graduate  Occupational History   Not on file  Tobacco Use   Smoking status: Never   Smokeless tobacco: Never  Vaping Use   Vaping status: Never Used  Substance and Sexual Activity   Alcohol use: No   Drug use: No   Sexual activity: Not Currently  Other Topics Concern   Not on file  Social History Narrative   She tries to stay active, but over the last year (20 19-20 20), she has been very limited by her right knee pain.   Is able to go grocery shopping.   Social Drivers of Corporate investment banker Strain: Low Risk  (04/29/2023)   Overall Financial Resource Strain (CARDIA)    Difficulty of Paying Living Expenses: Not hard at all  Food Insecurity: No Food Insecurity (04/29/2023)   Hunger Vital Sign    Worried About Running Out of Food in the Last Year: Never true    Ran Out of Food in the Last Year: Never true  Transportation Needs: No Transportation Needs (04/29/2023)   PRAPARE - Administrator, Civil Service  (Medical): No    Lack of Transportation (Non-Medical): No  Physical Activity: Insufficiently Active (04/29/2023)   Exercise Vital Sign    Days of Exercise per Week: 5 days    Minutes of Exercise per Session: 20 min  Stress: Stress Concern Present (04/29/2023)   [employer] of Occupational Health - Occupational Stress Questionnaire    Feeling of Stress : To some extent  Social Connections: Moderately Isolated (04/29/2023)   Social Connection and Isolation Panel [NHANES]    Frequency of Communication with Friends and Family: More than three times a week    Frequency of Social Gatherings with Friends and Family: More than three times a week    Attends Religious Services: More than 4 times per year    Active Member of [employer] or Organizations: No    Attends Engineer, structural: Never    Marital Status: Never married    Tobacco Counseling Counseling given: Not Answered    Clinical Intake:  Pre-visit preparation completed: Yes  Pain : 0-10 Pain Score: 9  Pain Type: Chronic pain Pain Location: Generalized Pain Descriptors / Indicators: Aching Pain Onset: More than a month ago Pain Frequency: Intermittent     BMI - recorded: 33.65 Nutritional Status: BMI > 30  Obese Diabetes: Yes CBG done?: No Did pt. bring in CBG monitor from home?: No  Lab Results  Component Value Date   HGBA1C 7.4 (A) 03/26/2023   HGBA1C 7.3 (A) 12/25/2022   HGBA1C 7.2 (A) 09/25/2022     How often do you need to have someone help you when you read instructions, pamphlets, or other written materials from your doctor or pharmacy?: 1 - Never  Interpreter Needed?: No  Information entered by :: Lanier Ensign, LPN   Activities of Daily Living     04/29/2023    7:59 AM 05/29/2022    9:32 PM  In your present state of health, do you have any difficulty performing the following activities:  Hearing? 0   Vision? 0   Difficulty concentrating or making decisions? 0   Walking or climbing  stairs? 1   Comment arthritis   Dressing or bathing? 0   Doing errands, shopping? 0 0  Preparing Food and eating ? N  Using the Toilet? N   In the past six months, have you accidently leaked urine? N   Do you have problems with loss of bowel control? N   Managing your Medications? N   Managing your Finances? N   Housekeeping or managing your Housekeeping? N     Patient Care Team: Ardith Dark, MD as PCP - General (Family Medicine) Marykay Lex, MD as PCP - Cardiology (Cardiology) Jethro Bolus, MD as Consulting Physician (Ophthalmology)  Indicate any recent Medical Services you may have received from other than [facility] providers in the past year (date may be approximate).     Assessment:   This is a routine wellness examination for Shalayne.  Hearing/Vision screen Hearing Screening - Comments:: Pt denies any hearing issues  Vision Screening - Comments:: Wears rx glasses - up to date with routine eye exams with [facility] However will find new provider     Goals Addressed             This Visit's Progress    Patient Stated       Each day, aim for 6 glasses of water, plenty of protein in your diet and try to get up and walk/ stretch every hour for 5-10 minutes at a time.         Depression Screen     04/29/2023    8:03 AM 03/09/2023   11:22 AM 12/16/2022    1:18 PM 10/27/2022    2:43 PM 09/25/2022   11:23 AM 08/25/2022   11:07 AM 08/13/2022   10:06 AM  PHQ 2/9 Scores  PHQ - 2 Score 1 0 3  0 0 1  PHQ- 9 Score   11  0 0      Information is confidential and restricted. Go to Review Flowsheets to unlock data.    Fall Risk     04/29/2023    8:06 AM 12/16/2022    1:23 PM 09/25/2022   11:23 AM 08/25/2022   11:07 AM 08/13/2022   10:09 AM  Fall Risk   Falls in the past year? 0 0 0 0 0  Number falls in past yr: 0  0 0   Injury with Fall? 0  0 0   Risk for fall due to : Impaired mobility  No Fall Risks;Impaired balance/gait No Fall Risks;Impaired balance/gait   Follow up  Falls prevention discussed        MEDICARE RISK AT HOME:  Medicare Risk at Home Any stairs in or around the home?: No If so, are there any without handrails?: No Home free of loose throw rugs in walkways, pet beds, electrical cords, etc?: Yes Adequate lighting in your home to reduce risk of falls?: Yes Life alert?: Yes Use of a cane, walker or w/c?: Yes Grab bars in the bathroom?: No Shower chair or bench in shower?: Yes Elevated toilet seat or a handicapped toilet?: No  TIMED UP AND GO:  Was the test performed?  No  Cognitive Function: 6CIT completed    03/07/2021   10:25 AM 09/12/2020    2:35 PM 01/03/2020    2:16 PM  MMSE - Mini Mental State Exam  Orientation to time 5 5 5   Orientation to Place 5 4 4   Registration 3 3 3   Attention/ Calculation 0 2 0  Recall 3 1 2   Language- name 2 objects 2 2 2   Language- repeat 0 0 1  Language- follow 3 step command 3 3 3   Language- read & follow  direction 1 1 1   Write a sentence 1 1 1   Copy design 0 0 0  Total score 23 22 22         04/29/2023    8:06 AM 10/19/2019   11:02 AM 10/12/2018    1:09 PM 12/25/2016   10:39 AM  6CIT Screen  What Year? 0 points 0 points 0 points 0 points  What month? 0 points 0 points 0 points 0 points  What time? 0 points 0 points 0 points 0 points  Count back from 20 0 points 0 points 0 points 0 points  Months in reverse 0 points 0 points 0 points 4 points  Repeat phrase 0 points 0 points 8 points 10 points  Total Score 0 points 0 points 8 points 14 points    Immunizations Immunization History  Administered Date(s) Administered   Fluad Quad(high Dose 65+) 10/16/2019, 10/16/2020, 09/26/2021   Influenza Split 10/20/2013   Influenza Whole 01/10/2008, 12/28/2008, 12/02/2009   Influenza, High Dose Seasonal PF 10/20/2013, 10/16/2018   Influenza, Seasonal, Injecte, Preservative Fre 11/23/2012   Influenza,inj,Quad PF,6+ Mos 12/24/2015, 09/16/2016   Influenza-Unspecified 10/09/2011, 10/19/2014,  09/23/2017   PFIZER(Purple Top)SARS-COV-2 Vaccination 03/14/2019, 04/04/2019, 12/25/2019   Pneumococcal Conjugate-13 10/20/2013   Pneumococcal Polysaccharide-23 03/24/2007   Td 09/28/2007   Td (Adult), 2 Lf Tetanus Toxid, Preservative Free 09/28/2007   Zoster Recombinant(Shingrix) 01/21/2018    Screening Tests Health Maintenance  Topic Date Due   Zoster Vaccines- Shingrix (2 of 2) 03/18/2018   OPHTHALMOLOGY EXAM  04/10/2022   COVID-19 Vaccine (4 - 2024-25 season) 03/25/2024 (Originally 09/20/2022)   FOOT EXAM  05/06/2023   INFLUENZA VACCINE  08/20/2023   HEMOGLOBIN A1C  09/26/2023   Medicare Annual Wellness (AWV)  04/28/2024   Pneumonia Vaccine 78+ Years old  Completed   DEXA SCAN  Completed   HPV VACCINES  Aged Out   Meningococcal B Vaccine  Aged Out   DTaP/Tdap/Td  Discontinued    Health Maintenance  Health Maintenance Due  Topic Date Due   Zoster Vaccines- Shingrix (2 of 2) 03/18/2018   OPHTHALMOLOGY EXAM  04/10/2022   Health Maintenance Items Addressed: See Nurse Notes  Additional Screening:  Vision Screening: Recommended annual ophthalmology exams for early detection of glaucoma and other disorders of the eye.  Dental Screening: Recommended annual dental exams for proper oral hygiene  Community Resource Referral / Chronic Care Management: CRR required this visit?  No   CCM required this visit?  No     Plan:     I have personally reviewed and noted the following in the patient's chart:   Medical and social history Use of alcohol, tobacco or illicit drugs  Current medications and supplements including opioid prescriptions. Patient is currently taking opioid prescriptions. Information provided to patient regarding non-opioid alternatives. Patient advised to discuss non-opioid treatment plan with their provider. Functional ability and status Nutritional status Physical activity Advanced directives List of other physicians Hospitalizations, surgeries, and  ER visits in previous 12 months Vitals Screenings to include cognitive, depression, and falls Referrals and appointments  In addition, I have reviewed and discussed with patient certain preventive protocols, quality metrics, and best practice recommendations. A written personalized care plan for preventive services as well as general preventive health recommendations were provided to patient.     Marzella Schlein, LPN   09/17/5619   After Visit Summary: (Mail) Due to this being a telephonic visit, the after visit summary with patients personalized plan was offered to patient via mail  Notes: Nothing significant to report at this time.

## 2023-04-29 NOTE — Patient Instructions (Signed)
 Casey [name] , Thank you for taking time to come for your Medicare Wellness Visit. I appreciate your ongoing commitment to your health goals. Please review the following plan we discussed and let me know if I can assist you in the future.   Referrals/Orders/Follow-Ups/Clinician Recommendations: Each day, aim for 6 glasses of water, plenty of protein in your diet and try to get up and walk/ stretch every hour for 5-10 minutes at a time.    This is a list of the screening recommended for you and due dates:  Health Maintenance  Topic Date Due   Zoster (Shingles) Vaccine (2 of 2) 03/18/2018   Eye exam for diabetics  04/10/2022   COVID-19 Vaccine (4 - 2024-25 season) 03/25/2024*   Complete foot exam   05/06/2023   Flu Shot  08/20/2023   Hemoglobin A1C  09/26/2023   Medicare Annual Wellness Visit  04/28/2024   Pneumonia Vaccine  Completed   DEXA scan (bone density measurement)  Completed   HPV Vaccine  Aged Out   Meningitis B Vaccine  Aged Out   DTaP/Tdap/Td vaccine  Discontinued  *Topic was postponed. The date shown is not the original due date.    Advanced directives: (Declined) Advance directive discussed with you today. Even though you declined this today, please call our office should you change your mind, and we can give you the proper paperwork for you to fill out.  Next Medicare Annual Wellness Visit scheduled for next year: Yes

## 2023-05-05 ENCOUNTER — Other Ambulatory Visit: Payer: Self-pay | Admitting: Family Medicine

## 2023-05-05 DIAGNOSIS — M170 Bilateral primary osteoarthritis of knee: Secondary | ICD-10-CM

## 2023-05-05 NOTE — Telephone Encounter (Unsigned)
 Copied from CRM [id number]. Topic: Clinical - Medication Refill >> May 05, 2023  2:16 PM Martinique E wrote: Most Recent Primary Care Visit:  Provider: Bruno Capri  Department: [facility]  Visit Type: ANNUAL WELL VISIT, SEQUENTIAL  Date: 04/29/2023  Medication: HYDROcodone-acetaminophen (NORCO/VICODIN) 5-325 MG tablet  Has the patient contacted their pharmacy? No (Agent: If no, request that the patient contact the pharmacy for the refill. If patient does not wish to contact the pharmacy document the reason why and proceed with request.) (Agent: If yes, when and what did the pharmacy advise?)  Is this the correct pharmacy for this prescription? Yes If no, delete pharmacy and type the correct one.  This is the patient's preferred pharmacy:  [facility] [address] [place] - [address] [place] [address] Phone: [phone] Fax: [phone]   Has the prescription been filled recently? No  Is the patient out of the medication? Yes  Has the patient been seen for an appointment in the last year OR does the patient have an upcoming appointment? Yes  Can we respond through MyChart? No  Agent: Please be advised that Rx refills may take up to 3 business days. We ask that you follow-up with your pharmacy.

## 2023-05-06 MED ORDER — HYDROCODONE-ACETAMINOPHEN 5-325 MG PO TABS: 1.0000 | ORAL_TABLET | Freq: Three times a day (TID) | ORAL | 0 refills | Status: DC | PRN

## 2023-05-06 NOTE — Telephone Encounter (Signed)
 Pt requesting refill for Hydrocodone 5-325 mg. Last OV 3/7/20205.

## 2023-05-11 ENCOUNTER — Telehealth: Payer: Self-pay | Admitting: *Deleted

## 2023-05-11 NOTE — Telephone Encounter (Signed)
 Pharmacy requesting QUEtiapine  (SEROQUEL ) 50 MG tablet Last refill done by historical provider

## 2023-05-12 ENCOUNTER — Other Ambulatory Visit: Payer: Self-pay | Admitting: *Deleted

## 2023-05-12 NOTE — Telephone Encounter (Signed)
 We stopped this last year. Please have pharmacy remove from their medication list for the patient.  Jinny Mounts. Daneil Dunker, MD 05/12/2023 10:11 AM

## 2023-05-12 NOTE — Telephone Encounter (Signed)
 Faxed send to [facility] with information  Fax to [phone]

## 2023-05-17 ENCOUNTER — Other Ambulatory Visit: Payer: Self-pay

## 2023-05-17 ENCOUNTER — Other Ambulatory Visit: Payer: Self-pay | Admitting: *Deleted

## 2023-05-19 ENCOUNTER — Telehealth: Payer: Self-pay | Admitting: *Deleted

## 2023-05-19 NOTE — Telephone Encounter (Signed)
 Copied from CRM [id number]. Topic: Clinical - Medication Question >> May 18, 2023 10:19 AM DeAngela L wrote: Reason for CRM: Patient calling to ask why [facility] pharmacy was told she was no longer taking QUEtiapine  (SEROQUEL ) 50 MG tablet, patient is concerned and would like a call back about this medication not being filled  Patient number [phone] (M)  Please advise  Rashunda Passon,RMA

## 2023-05-19 NOTE — Telephone Encounter (Signed)
 Please schedule an OV with Dr Daneil Dunker for Medication reviewed  Let her know to bring all her medication to the visit

## 2023-05-19 NOTE — Telephone Encounter (Signed)
 FYI - sent Mychart message to pt to call office to scheduled appt.

## 2023-05-19 NOTE — Telephone Encounter (Signed)
 Please have her schedule an office visit with me or the pharmacy team for medication review. She needs to bring all of her medications to this visit.  Jinny Mounts. Daneil Dunker, MD 05/19/2023 9:59 AM

## 2023-05-20 ENCOUNTER — Other Ambulatory Visit: Payer: Self-pay | Admitting: *Deleted

## 2023-05-20 MED ORDER — DONEPEZIL HCL 10 MG PO TABS
10.0000 mg | ORAL_TABLET | Freq: Every day | ORAL | 0 refills | Status: DC
Start: 2023-05-20 — End: 2023-09-30

## 2023-05-20 NOTE — Telephone Encounter (Signed)
 Pharmacy requesting Rx Donepezil  refill

## 2023-06-02 ENCOUNTER — Other Ambulatory Visit: Payer: Self-pay | Admitting: Family Medicine

## 2023-06-02 DIAGNOSIS — M170 Bilateral primary osteoarthritis of knee: Secondary | ICD-10-CM

## 2023-06-02 NOTE — Telephone Encounter (Signed)
 Copied from CRM [id number]. Topic: Clinical - Medication Refill >> Jun 02, 2023  9:08 AM Earnestine Goes B wrote: Medication: HYDROcodone -acetaminophen  (NORCO/VICODIN) 5-325 MG tablet  Has the patient contacted their pharmacy? Yes (Agent: If no, request that the patient contact the pharmacy for the refill. If patient does not wish to contact the pharmacy document the reason why and proceed with request.) (Agent: If yes, when and what did the pharmacy advise?)  This is the patient's preferred pharmacy:  [facility] [address], [place] - [address] [place] [address] Phone: [phone] Fax: [phone]    Is this the correct pharmacy for this prescription? Yes If no, delete pharmacy and type the correct one.   Has the prescription been filled recently? Yes  Is the patient out of the medication? Yes  Has the patient been seen for an appointment in the last year OR does the patient have an upcoming appointment? Yes  Can we respond through MyChart? Yes  Agent: Please be advised that Rx refills may take up to 3 business days. We ask that you follow-up with your pharmacy.

## 2023-06-04 MED ORDER — HYDROCODONE-ACETAMINOPHEN 5-325 MG PO TABS: 1.0000 | ORAL_TABLET | Freq: Three times a day (TID) | ORAL | 0 refills | Status: DC | PRN

## 2023-06-21 ENCOUNTER — Other Ambulatory Visit: Payer: Self-pay | Admitting: *Deleted

## 2023-06-21 MED ORDER — FUROSEMIDE 40 MG PO TABS: 40.0000 mg | ORAL_TABLET | Freq: Every day | ORAL | 0 refills | Status: DC

## 2023-06-29 ENCOUNTER — Encounter: Payer: Self-pay | Admitting: Family Medicine

## 2023-06-29 ENCOUNTER — Ambulatory Visit (INDEPENDENT_AMBULATORY_CARE_PROVIDER_SITE_OTHER): Admitting: Family Medicine

## 2023-06-29 VITALS — BP 136/81 | HR 77 | Temp 97.2°F | Ht 62.0 in | Wt 180.0 lb

## 2023-06-29 DIAGNOSIS — F321 Major depressive disorder, single episode, moderate: Secondary | ICD-10-CM

## 2023-06-29 DIAGNOSIS — F411 Generalized anxiety disorder: Secondary | ICD-10-CM

## 2023-06-29 DIAGNOSIS — E1129 Type 2 diabetes mellitus with other diabetic kidney complication: Secondary | ICD-10-CM | POA: Diagnosis not present

## 2023-06-29 DIAGNOSIS — R5381 Other malaise: Secondary | ICD-10-CM

## 2023-06-29 DIAGNOSIS — G8929 Other chronic pain: Secondary | ICD-10-CM

## 2023-06-29 DIAGNOSIS — F0390 Unspecified dementia without behavioral disturbance: Secondary | ICD-10-CM

## 2023-06-29 DIAGNOSIS — R809 Proteinuria, unspecified: Secondary | ICD-10-CM | POA: Diagnosis not present

## 2023-06-29 DIAGNOSIS — Z7984 Long term (current) use of oral hypoglycemic drugs: Secondary | ICD-10-CM

## 2023-06-29 DIAGNOSIS — I152 Hypertension secondary to endocrine disorders: Secondary | ICD-10-CM

## 2023-06-29 DIAGNOSIS — E1159 Type 2 diabetes mellitus with other circulatory complications: Secondary | ICD-10-CM

## 2023-06-29 DIAGNOSIS — Z91148 Patient's other noncompliance with medication regimen for other reason: Secondary | ICD-10-CM

## 2023-06-29 LAB — MICROALBUMIN / CREATININE URINE RATIO
Creatinine,U: 216.3 mg/dL
Microalb Creat Ratio: 215.7 mg/g — ABNORMAL HIGH (ref 0.0–30.0)
Microalb, Ur: 46.6 mg/dL — ABNORMAL HIGH (ref 0.0–1.9)

## 2023-06-29 LAB — POCT GLYCOSYLATED HEMOGLOBIN (HGB A1C): Hemoglobin A1C: 8.5 % — AB (ref 4.0–5.6)

## 2023-06-29 MED ORDER — BUPROPION HCL ER (XL) 150 MG PO TB24: 150.0000 mg | ORAL_TABLET | Freq: Every day | ORAL | 3 refills | Status: AC

## 2023-06-29 NOTE — Patient Instructions (Signed)
 It was very nice to see you today!  Please start the Wellbutrin .   Your blood sugar is up a little bit today.  Please continue to work on reducing his sugar intake.  Please make sure that you are consistently taking your Januvia  and Jardiance .  I will refer you to see a psychiatrist.  I will also refer you for extra assistance with medication management and extra resources for your mental health.  Return in about 3 months (around 09/29/2023) for Follow Up.   Take care, Dr Daneil Dunker  PLEASE NOTE:  If you had any lab tests, please let us  know if you have not heard back within a few days. You may see your results on mychart before we have a chance to review them but we will give you a call once they are reviewed by us .   If we ordered any referrals today, please let us  know if you have not heard from their office within the next week.   If you had any urgent prescriptions sent in today, please check with the pharmacy within an hour of our visit to make sure the prescription was transmitted appropriately.   Please try these tips to maintain a healthy lifestyle:  Eat at least 3 REAL meals and 1-2 snacks per day.  Aim for no more than 5 hours between eating.  If you eat breakfast, please do so within one hour of getting up.   Each meal should contain half fruits/vegetables, one quarter protein, and one quarter carbs (no bigger than a computer mouse)  Cut down on sweet beverages. This includes juice, soda, and sweet tea.   Drink at least 1 glass of water  with each meal and aim for at least 8 glasses per day  Exercise at least 150 minutes every week.

## 2023-06-29 NOTE — Assessment & Plan Note (Signed)
 Depressive symptoms are not fully controlled.  Elevated PHQ today.  She does not have Prozac  with her today though tells me that she just forgot to bring it after I asked her if she was taking it consistently.  We have tried several other medications in the past without much success.  I did refer her to see psychiatry a few months ago however it appears that the referral was declined.  Advised patient to double check on her Prozac  when she gets home though we will try Wellbutrin  150 mg daily in addition to this.  Will be placing another referral to psychiatry and also referral to [facility] to help with medication management and social support.

## 2023-06-29 NOTE — Assessment & Plan Note (Signed)
 On donepezil  10 mg daily.  Follows with neurology for this.  Has a a lot of issues with medication adherence though still lives relatively independently.  Will be placing referral to [facility] care management for extra support

## 2023-06-29 NOTE — Assessment & Plan Note (Signed)
 At goal today.  Not clear if she is actually taking her amlodipine  as it was not with her medications today.  Advised her to double check when she gets home.  She will monitor blood pressure at home and let us  know if persistently elevated.

## 2023-06-29 NOTE — Progress Notes (Signed)
 Casey [name] is a 88 y.o. female who presents today for an office visit.  Assessment/Plan:  Chronic Problems Addressed Today: Type 2 diabetes mellitus with microalbuminuria, without long-term current use of insulin  (HCC) A1c up slightly at 8.5 but it is not clear if she has been consistent with her Jardiance  as she did not bring it with her medications today.  Advised her to double check when she gets home to make sure that she is taking.  We will continue current regimen for now Jardiance  25 mg daily and Januvia  100 mg daily.  Recheck A1c in 3 months.  Will be placing referral to [facility] as below help with diabetes and medication management.  Hypertension associated with diabetes (HCC) At goal today.  Not clear if she is actually taking her amlodipine  as it was not with her medications today.  Advised her to double check when she gets home.  She will monitor blood pressure at home and let us  know if persistently elevated.  Depression, major, single episode, moderate (HCC) Depressive symptoms are not fully controlled.  Elevated PHQ today.  She does not have Prozac  with her today though tells me that she just forgot to bring it after I asked her if she was taking it consistently.  We have tried several other medications in the past without much success.  I did refer her to see psychiatry a few months ago however it appears that the referral was declined.  Advised patient to double check on her Prozac  when she gets home though we will try Wellbutrin  150 mg daily in addition to this.  Will be placing another referral to psychiatry and also referral to [facility] to help with medication management and social support.  Dementia without behavioral disturbance (HCC) On donepezil  10 mg daily.  Follows with neurology for this.  Has a a lot of issues with medication adherence though still lives relatively independently.  Will be placing referral to [facility] care management for extra support  GAD (generalized anxiety  disorder) Also not controlled though it is not clear she has been taking Prozac .  As above advised her to make sure that she is taking this when she gets home.  Will be adding back Wellbutrin .  She will also continue her gabapentin  300 mg 3 times daily.  Will be referring to psychiatry as above.  Follow-up in 3 months.  Chronic pain On Norco 5-3 25 every 8 hours as needed.  Does not need refill today.  Poor compliance with medication Had lengthy discussion with patient today regarding medication use.  She did not bring all the medications with her today.  It is not clear if she is not taking them or if she just forgot to bring them.  She has had home health and our clinical pharmacy work with her in the past to help with this though this is still a significant ongoing issue that makes management of her other chronic medical conditions difficult.  Will be placing referral for [facility] to help with medication adherence assistance.     Subjective:  HPI:  See Assessment / plan for status of chronic conditions. Patient here today for 71-month follow-up.  Last time A1c stable 7.4 on Jardiance  25 mg daily and Januvia  100 mg daily.  Overall is doing well today though has been more down and depressed recently.  She would like to try a different medication to help with mood management.  She has had a lot of ongoing issues with medication adherence.  She did bring her medications with her today however did not have fluoxetine , Jardiance , or amlodipine  and her medications that she brought in today.  Patient states that she has these at home though just forgot to bring them in.        Objective:  Physical Exam: BP 136/81   Pulse 77   Temp (!) 97.2 F (36.2 C) (Temporal)   Ht 5\' 2"  (1.575 m)   Wt 180 lb (81.6 kg)   SpO2 98%   BMI 32.92 kg/m   Gen: No acute distress, resting comfortably CV: Regular rate and rhythm with no murmurs appreciated Pulm: Normal work of breathing, clear to auscultation  bilaterally with no crackles, wheezes, or rhonchi Neuro: Grossly normal, moves all extremities Psych: Normal affect and thought content  Time Spent: 40 minutes of total time was spent on the date of the encounter performing the following actions: chart review prior to seeing the patient, obtaining history, performing a medically necessary exam, counseling on the treatment plan, placing orders, and documenting in our EHR.        Jinny Mounts. Daneil Dunker, MD 06/29/2023 11:15 AM

## 2023-06-29 NOTE — Assessment & Plan Note (Signed)
 Had lengthy discussion with patient today regarding medication use.  She did not bring all the medications with her today.  It is not clear if she is not taking them or if she just forgot to bring them.  She has had home health and our clinical pharmacy work with her in the past to help with this though this is still a significant ongoing issue that makes management of her other chronic medical conditions difficult.  Will be placing referral for [facility] to help with medication adherence assistance.

## 2023-06-29 NOTE — Assessment & Plan Note (Signed)
 On Norco 5-3 25 every 8 hours as needed.  Does not need refill today.

## 2023-06-29 NOTE — Assessment & Plan Note (Addendum)
 A1c up slightly at 8.5 but it is not clear if she has been consistent with her Jardiance  as she did not bring it with her medications today.  Advised her to double check when she gets home to make sure that she is taking.  We will continue current regimen for now Jardiance  25 mg daily and Januvia  100 mg daily.  Recheck A1c in 3 months.  Will be placing referral to [facility] as below help with diabetes and medication management.

## 2023-06-29 NOTE — Assessment & Plan Note (Signed)
 Also not controlled though it is not clear she has been taking Prozac .  As above advised her to make sure that she is taking this when she gets home.  Will be adding back Wellbutrin .  She will also continue her gabapentin  300 mg 3 times daily.  Will be referring to psychiatry as above.  Follow-up in 3 months.

## 2023-06-30 ENCOUNTER — Telehealth: Payer: Self-pay | Admitting: *Deleted

## 2023-06-30 ENCOUNTER — Other Ambulatory Visit: Payer: Self-pay | Admitting: *Deleted

## 2023-06-30 NOTE — Progress Notes (Signed)
 Complex Care Management Note  Care Guide Note 06/30/2023 Name: Casey [name] MRN: [id number] DOB: 28-Oct-1935  Casey [name] is a 88 y.o. year old female who sees Rodney Clamp, MD for primary care. I reached out to Casey [name] by phone today to offer complex care management services.  Casey [name] was given information about Complex Care Management services today including:   The Complex Care Management services include support from the care team which includes your Nurse Care Manager, Clinical Social Worker, or Pharmacist.  The Complex Care Management team is here to help remove barriers to the health concerns and goals most important to you. Complex Care Management services are voluntary, and the patient may decline or stop services at any time by request to their care team member.   Complex Care Management Consent Status: Patient agreed to services and verbal consent obtained.   Follow up plan:  Telephone appointment with complex care management team member scheduled for:  07/06/2023  Encounter Outcome:  Patient Scheduled  Kandis Ormond, CMA [place]  [facility], [facility] Guide Direct Dial: [phone]  Fax: [phone] Website: [email or web address]

## 2023-07-01 ENCOUNTER — Ambulatory Visit: Payer: Self-pay | Admitting: Family Medicine

## 2023-07-01 NOTE — Progress Notes (Signed)
 She has a moderate amount of protein in her urine. We can recheck in 3 months.

## 2023-07-02 ENCOUNTER — Encounter (HOSPITAL_COMMUNITY): Payer: Self-pay

## 2023-07-02 ENCOUNTER — Observation Stay (HOSPITAL_COMMUNITY)

## 2023-07-02 ENCOUNTER — Observation Stay (HOSPITAL_COMMUNITY)
Admission: EM | Admit: 2023-07-02 | Discharge: 2023-07-05 | Disposition: A | Discharge status: Skilled Nursing Facility | Attending: Internal Medicine | Admitting: Internal Medicine

## 2023-07-02 ENCOUNTER — Emergency Department (HOSPITAL_COMMUNITY)

## 2023-07-02 ENCOUNTER — Other Ambulatory Visit: Payer: Self-pay

## 2023-07-02 DIAGNOSIS — Z96651 Presence of right artificial knee joint: Secondary | ICD-10-CM | POA: Diagnosis not present

## 2023-07-02 DIAGNOSIS — R270 Ataxia, unspecified: Secondary | ICD-10-CM | POA: Insufficient documentation

## 2023-07-02 DIAGNOSIS — F411 Generalized anxiety disorder: Secondary | ICD-10-CM | POA: Diagnosis not present

## 2023-07-02 DIAGNOSIS — E1129 Type 2 diabetes mellitus with other diabetic kidney complication: Secondary | ICD-10-CM | POA: Diagnosis not present

## 2023-07-02 DIAGNOSIS — K219 Gastro-esophageal reflux disease without esophagitis: Secondary | ICD-10-CM | POA: Diagnosis not present

## 2023-07-02 DIAGNOSIS — R29818 Other symptoms and signs involving the nervous system: Secondary | ICD-10-CM | POA: Diagnosis not present

## 2023-07-02 DIAGNOSIS — E785 Hyperlipidemia, unspecified: Secondary | ICD-10-CM | POA: Insufficient documentation

## 2023-07-02 DIAGNOSIS — R519 Headache, unspecified: Secondary | ICD-10-CM | POA: Diagnosis present

## 2023-07-02 DIAGNOSIS — I130 Hypertensive heart and chronic kidney disease with heart failure and stage 1 through stage 4 chronic kidney disease, or unspecified chronic kidney disease: Secondary | ICD-10-CM | POA: Insufficient documentation

## 2023-07-02 DIAGNOSIS — I5032 Chronic diastolic (congestive) heart failure: Secondary | ICD-10-CM | POA: Insufficient documentation

## 2023-07-02 DIAGNOSIS — E876 Hypokalemia: Secondary | ICD-10-CM | POA: Diagnosis not present

## 2023-07-02 DIAGNOSIS — R569 Unspecified convulsions: Secondary | ICD-10-CM

## 2023-07-02 DIAGNOSIS — F0390 Unspecified dementia without behavioral disturbance: Secondary | ICD-10-CM | POA: Diagnosis not present

## 2023-07-02 DIAGNOSIS — E1165 Type 2 diabetes mellitus with hyperglycemia: Secondary | ICD-10-CM | POA: Diagnosis not present

## 2023-07-02 DIAGNOSIS — E1142 Type 2 diabetes mellitus with diabetic polyneuropathy: Secondary | ICD-10-CM | POA: Diagnosis not present

## 2023-07-02 DIAGNOSIS — N1831 Chronic kidney disease, stage 3a: Secondary | ICD-10-CM | POA: Insufficient documentation

## 2023-07-02 DIAGNOSIS — Z79899 Other long term (current) drug therapy: Secondary | ICD-10-CM | POA: Insufficient documentation

## 2023-07-02 DIAGNOSIS — R2990 Unspecified symptoms and signs involving the nervous system: Secondary | ICD-10-CM | POA: Diagnosis not present

## 2023-07-02 DIAGNOSIS — H8190 Unspecified disorder of vestibular function, unspecified ear: Secondary | ICD-10-CM | POA: Diagnosis not present

## 2023-07-02 DIAGNOSIS — R251 Tremor, unspecified: Secondary | ICD-10-CM | POA: Diagnosis not present

## 2023-07-02 DIAGNOSIS — E1122 Type 2 diabetes mellitus with diabetic chronic kidney disease: Secondary | ICD-10-CM | POA: Diagnosis not present

## 2023-07-02 DIAGNOSIS — R809 Proteinuria, unspecified: Secondary | ICD-10-CM

## 2023-07-02 DIAGNOSIS — F321 Major depressive disorder, single episode, moderate: Secondary | ICD-10-CM | POA: Diagnosis not present

## 2023-07-02 LAB — COMPREHENSIVE METABOLIC PANEL WITH GFR
ALT: 13 U/L (ref 0–44)
AST: 19 U/L (ref 15–41)
Albumin: 3.8 g/dL (ref 3.5–5.0)
Alkaline Phosphatase: 92 U/L (ref 38–126)
Anion gap: 13 (ref 5–15)
BUN: 7 mg/dL — ABNORMAL LOW (ref 8–23)
CO2: 24 mmol/L (ref 22–32)
Calcium: 9.3 mg/dL (ref 8.9–10.3)
Chloride: 101 mmol/L (ref 98–111)
Creatinine, Ser: 1.04 mg/dL — ABNORMAL HIGH (ref 0.44–1.00)
GFR, Estimated: 52 mL/min — ABNORMAL LOW (ref >60)
Glucose, Bld: 244 mg/dL — ABNORMAL HIGH (ref 70–99)
Potassium: 3 mmol/L — ABNORMAL LOW (ref 3.5–5.1)
Sodium: 138 mmol/L (ref 135–145)
Total Bilirubin: 0.6 mg/dL (ref 0.0–1.2)
Total Protein: 6.8 g/dL (ref 6.5–8.1)

## 2023-07-02 LAB — I-STAT CHEM 8, ED
BUN: 7 mg/dL — ABNORMAL LOW (ref 8–23)
Calcium, Ion: 1.08 mmol/L — ABNORMAL LOW (ref 1.15–1.40)
Chloride: 100 mmol/L (ref 98–111)
Creatinine, Ser: 1 mg/dL (ref 0.44–1.00)
Glucose, Bld: 244 mg/dL — ABNORMAL HIGH (ref 70–99)
HCT: 38 % (ref 36.0–46.0)
Hemoglobin: 12.9 g/dL (ref 12.0–15.0)
Potassium: 2.9 mmol/L — ABNORMAL LOW (ref 3.5–5.1)
Sodium: 139 mmol/L (ref 135–145)
TCO2: 24 mmol/L (ref 22–32)

## 2023-07-02 LAB — DIFFERENTIAL
Abs Immature Granulocytes: 0.02 10*3/uL (ref 0.00–0.07)
Basophils Absolute: 0.1 10*3/uL (ref 0.0–0.1)
Basophils Relative: 1 %
Eosinophils Absolute: 0.1 10*3/uL (ref 0.0–0.5)
Eosinophils Relative: 2 %
Immature Granulocytes: 0 %
Lymphocytes Relative: 46 %
Lymphs Abs: 3.8 10*3/uL (ref 0.7–4.0)
Monocytes Absolute: 0.7 10*3/uL (ref 0.1–1.0)
Monocytes Relative: 8 %
Neutro Abs: 3.6 10*3/uL (ref 1.7–7.7)
Neutrophils Relative %: 43 %

## 2023-07-02 LAB — GLUCOSE, CAPILLARY
Glucose-Capillary: 259 mg/dL — ABNORMAL HIGH (ref 70–99)
Glucose-Capillary: 211 mg/dL — ABNORMAL HIGH (ref 70–99)
Glucose-Capillary: 161 mg/dL — ABNORMAL HIGH (ref 70–99)
Glucose-Capillary: 129 mg/dL — ABNORMAL HIGH (ref 70–99)

## 2023-07-02 LAB — CBC
HCT: 36.9 % (ref 36.0–46.0)
Hemoglobin: 12.1 g/dL (ref 12.0–15.0)
MCH: 28.6 pg (ref 26.0–34.0)
MCHC: 32.8 g/dL (ref 30.0–36.0)
MCV: 87.2 fL (ref 80.0–100.0)
Platelets: 253 10*3/uL (ref 150–400)
RBC: 4.23 MIL/uL (ref 3.87–5.11)
RDW: 14.8 % (ref 11.5–15.5)
WBC: 8.3 10*3/uL (ref 4.0–10.5)
nRBC: 0 % (ref 0.0–0.2)

## 2023-07-02 LAB — PROTIME-INR
INR: 1.1 (ref 0.8–1.2)
Prothrombin Time: 14.9 s (ref 11.4–15.2)

## 2023-07-02 LAB — RAPID URINE DRUG SCREEN, HOSP PERFORMED
Amphetamines: NOT DETECTED
Barbiturates: NOT DETECTED
Benzodiazepines: NOT DETECTED
Cocaine: NOT DETECTED
Opiates: NOT DETECTED
Tetrahydrocannabinol: NOT DETECTED

## 2023-07-02 LAB — APTT: aPTT: 23 s — ABNORMAL LOW (ref 24–36)

## 2023-07-02 LAB — CBG MONITORING, ED: Glucose-Capillary: 211 mg/dL — ABNORMAL HIGH (ref 70–99)

## 2023-07-02 LAB — ETHANOL: Alcohol, Ethyl (B): <15 mg/dL (ref <15)

## 2023-07-02 MED ORDER — FLUOXETINE HCL 20 MG PO CAPS
20.0000 mg | ORAL_CAPSULE | Freq: Every day | ORAL | Status: DC
  Administered 2023-07-02 – 2023-07-05 (×4): 20 mg via ORAL
  Filled 2023-07-02 (×4): qty 1

## 2023-07-02 MED ORDER — GABAPENTIN 300 MG PO CAPS
300.0000 mg | ORAL_CAPSULE | Freq: Three times a day (TID) | ORAL | Status: DC
  Administered 2023-07-02 – 2023-07-03 (×4): 300 mg via ORAL
  Filled 2023-07-02 (×4): qty 1

## 2023-07-02 MED ORDER — INSULIN ASPART 100 UNIT/ML IJ SOLN
0.0000 [IU] | Freq: Every day | INTRAMUSCULAR | Status: DC
  Administered 2023-07-03: 3 [IU] via SUBCUTANEOUS

## 2023-07-02 MED ORDER — ACETAMINOPHEN 650 MG RE SUPP: 650.0000 mg | Freq: Four times a day (QID) | RECTAL | Status: DC | PRN

## 2023-07-02 MED ORDER — PANTOPRAZOLE SODIUM 40 MG PO TBEC
40.0000 mg | DELAYED_RELEASE_TABLET | Freq: Every day | ORAL | Status: DC
  Administered 2023-07-02 – 2023-07-05 (×4): 40 mg via ORAL
  Filled 2023-07-02 (×4): qty 1

## 2023-07-02 MED ORDER — ATORVASTATIN CALCIUM 10 MG PO TABS
10.0000 mg | ORAL_TABLET | Freq: Every day | ORAL | Status: DC
  Administered 2023-07-02 – 2023-07-05 (×4): 10 mg via ORAL
  Filled 2023-07-02 (×4): qty 1

## 2023-07-02 MED ORDER — ACETAMINOPHEN 325 MG PO TABS
650.0000 mg | ORAL_TABLET | Freq: Four times a day (QID) | ORAL | Status: DC | PRN
  Filled 2023-07-02: qty 2

## 2023-07-02 MED ORDER — SODIUM CHLORIDE 0.9% FLUSH
3.0000 mL | Freq: Two times a day (BID) | INTRAVENOUS | Status: DC
  Administered 2023-07-02 – 2023-07-05 (×7): 3 mL via INTRAVENOUS

## 2023-07-02 MED ORDER — BUSPIRONE HCL 5 MG PO TABS
10.0000 mg | ORAL_TABLET | Freq: Two times a day (BID) | ORAL | Status: DC
  Administered 2023-07-02 – 2023-07-03 (×3): 10 mg via ORAL
  Filled 2023-07-02 (×3): qty 2

## 2023-07-02 MED ORDER — ENOXAPARIN SODIUM 40 MG/0.4ML IJ SOSY
40.0000 mg | PREFILLED_SYRINGE | INTRAMUSCULAR | Status: DC
  Administered 2023-07-02 – 2023-07-05 (×4): 40 mg via SUBCUTANEOUS
  Filled 2023-07-02 (×4): qty 0.4

## 2023-07-02 MED ORDER — MAGNESIUM SULFATE IN D5W 1-5 GM/100ML-% IV SOLN
1.0000 g | Freq: Once | INTRAVENOUS | Status: AC
  Administered 2023-07-02: 1 g via INTRAVENOUS
  Filled 2023-07-02: qty 100

## 2023-07-02 MED ORDER — AMLODIPINE BESYLATE 5 MG PO TABS
5.0000 mg | ORAL_TABLET | Freq: Every day | ORAL | Status: DC
  Administered 2023-07-02 – 2023-07-05 (×4): 5 mg via ORAL
  Filled 2023-07-02 (×4): qty 1

## 2023-07-02 MED ORDER — HYDROCODONE-ACETAMINOPHEN 5-325 MG PO TABS
1.0000 | ORAL_TABLET | Freq: Four times a day (QID) | ORAL | Status: DC | PRN
  Administered 2023-07-03 – 2023-07-05 (×6): 1 via ORAL
  Filled 2023-07-02 (×6): qty 1

## 2023-07-02 MED ORDER — POTASSIUM CHLORIDE CRYS ER 20 MEQ PO TBCR: 20.0000 meq | EXTENDED_RELEASE_TABLET | Freq: Every day | ORAL | Status: DC

## 2023-07-02 MED ORDER — POLYETHYLENE GLYCOL 3350 17 G PO PACK: 17.0000 g | PACK | Freq: Every day | ORAL | Status: DC | PRN

## 2023-07-02 MED ORDER — FUROSEMIDE 40 MG PO TABS
40.0000 mg | ORAL_TABLET | Freq: Every day | ORAL | Status: DC
  Administered 2023-07-02 – 2023-07-03 (×2): 40 mg via ORAL
  Filled 2023-07-02 (×2): qty 1

## 2023-07-02 MED ORDER — INSULIN ASPART 100 UNIT/ML IJ SOLN
0.0000 [IU] | Freq: Three times a day (TID) | INTRAMUSCULAR | Status: DC
  Administered 2023-07-02: 2 [IU] via SUBCUTANEOUS
  Administered 2023-07-02: 1 [IU] via SUBCUTANEOUS
  Administered 2023-07-03 – 2023-07-04 (×4): 3 [IU] via SUBCUTANEOUS
  Administered 2023-07-05: 4 [IU] via SUBCUTANEOUS
  Administered 2023-07-05: 1 [IU] via SUBCUTANEOUS

## 2023-07-02 MED ORDER — POTASSIUM CHLORIDE 10 MEQ/100ML IV SOLN
10.0000 meq | INTRAVENOUS | Status: AC
  Administered 2023-07-02 (×3): 10 meq via INTRAVENOUS
  Filled 2023-07-02 (×3): qty 100

## 2023-07-02 MED ORDER — DONEPEZIL HCL 10 MG PO TABS
10.0000 mg | ORAL_TABLET | Freq: Every day | ORAL | Status: DC
  Administered 2023-07-02 – 2023-07-04 (×3): 10 mg via ORAL
  Filled 2023-07-02 (×3): qty 1

## 2023-07-02 MED ORDER — POTASSIUM CHLORIDE CRYS ER 20 MEQ PO TBCR
40.0000 meq | EXTENDED_RELEASE_TABLET | ORAL | Status: AC
  Administered 2023-07-02 (×2): 40 meq via ORAL
  Filled 2023-07-02 (×3): qty 2

## 2023-07-02 NOTE — ED Provider Notes (Signed)
 [place] EMERGENCY DEPARTMENT AT [facility] Provider Note   CSN: [id number] Arrival date & time: 07/02/23  6213     Patient presents with: No chief complaint on file.   Casey [name] is a 88 y.o. female.   HPI     This is an 88 year old female with history of hypertension, depression, dementia with behavioral disturbance, vertigo who presents as a code stroke.  Describes waking up dizzy.  States that the dizziness is like she is about to pass out.  No room spinning dizziness.  Last known well 9 PM.  Reports having trouble walking.  EMS was called out by life alert.  Is having a posterior headache and sensation of lightheadedness.  EMS noted word finding difficulty.  No other focal deficits.  She did have an episode of trembling which she appeared to speak through.  No known history of seizures.  Questionable compliance with medication.  Prior to Admission medications   Medication Sig Start Date End Date Taking? Authorizing Provider  Accu-Chek Softclix Lancets lancets USE AS DIRECTED IN THE MORNING, AT NOON, AND AT BEDTIME 11/23/22   Rodney Clamp, MD  amLODipine  (NORVASC ) 2.5 MG tablet Take 2 tablets (5 mg total) by mouth daily. 07/16/22   Rodney Clamp, MD  atorvastatin  (LIPITOR) 10 MG tablet Take 1 tablet (10 mg total) by mouth daily. 10/15/22   Rodney Clamp, MD  azelastine  (ASTELIN ) 0.1 % nasal spray INSTILL 1 SPRAY INTO BOTH NOSTRILS TWICE DAILY 09/07/22   Rodney Clamp, MD  azelastine  (OPTIVAR ) 0.05 % ophthalmic solution Place 1 drop into both eyes 2 (two) times daily.    [provider]  Blood Glucose Monitoring Suppl DEVI 1 each by Does not apply route in the morning, at noon, and at bedtime. May substitute to any manufacturer covered by patient's insurance.Dx E11.29, 06/17/22   Rodney Clamp, MD  buPROPion  (WELLBUTRIN  XL) 150 MG 24 hr tablet Take 1 tablet (150 mg total) by mouth daily. 06/29/23   Rodney Clamp, MD  busPIRone  (BUSPAR ) 10 MG tablet  Take 1 tablet (10 mg total) by mouth 2 (two) times daily. 09/25/22   Rodney Clamp, MD  Carboxymethylcellul-Glycerin  (REFRESH OPTIVE) 1-0.9 % GEL Place 1 drop into both eyes daily.    [provider]  donepezil  (ARICEPT ) 10 MG tablet Take 1 tablet (10 mg total) by mouth at bedtime. 05/20/23   Rodney Clamp, MD  FLUoxetine  (PROZAC ) 20 MG tablet Take 1 tablet (20 mg total) by mouth daily. 03/26/23   Rodney Clamp, MD  fluticasone  (FLONASE ) 50 MCG/ACT nasal spray Place 1 spray into both nostrils daily for 7 days. 08/29/22 09/05/22  Trish Furl, MD  furosemide  (LASIX ) 40 MG tablet Take 1 tablet (40 mg total) by mouth daily. 06/21/23   Rodney Clamp, MD  gabapentin  (NEURONTIN ) 300 MG capsule TAKE 1 CAPSULE(300 MG) BY MOUTH THREE TIMES DAILY 03/10/23   Rodney Clamp, MD  glucose blood (ACCU-CHEK GUIDE) test strip USE AS DIRECTED IN THE MORNING, AT NOON, AND AT BEDTIME 09/14/22   Rodney Clamp, MD  HYDROcodone -acetaminophen  (NORCO/VICODIN) 5-325 MG tablet Take 1 tablet by mouth every 8 (eight) hours as needed for moderate pain (pain score 4-6). For chronic pain 06/04/23   Rodney Clamp, MD  JARDIANCE  25 MG TABS tablet TAKE 1 TABLET(25 MG) BY MOUTH DAILY BEFORE BREAKFAST 02/15/23   Rodney Clamp, MD  meclizine  (ANTIVERT ) 25 MG tablet Take 1 tablet (25 mg total)  by mouth 3 (three) times daily as needed for dizziness. 04/28/23   Rodney Clamp, MD  Multiple Vitamins-Minerals (CERTAVITE/ANTIOXIDANTS) TABS Take 1 tablet by mouth at bedtime. 06/02/22   Claretta Croft, MD  pantoprazole  (PROTONIX ) 40 MG tablet Take 1 tablet (40 mg total) by mouth daily. 12/25/22   Parker, Caleb M, MD  polyethylene glycol powder (GLYCOLAX /MIRALAX ) 17 GM/SCOOP powder Take 17 g by mouth daily. Patient taking differently: Take 17 g by mouth daily as needed for mild constipation or moderate constipation. 06/11/22   Rodney Clamp, MD  potassium chloride  SA (KLOR-CON  M) 20 MEQ tablet TAKE 1 TABLET(20 MEQ) BY MOUTH DAILY 02/23/23    Rodney Clamp, MD  senna-docusate (SENOKOT-S) 8.6-50 MG tablet Take 1 tablet by mouth at bedtime as needed for mild constipation. 06/02/22   Claretta Croft, MD  sitaGLIPtin  (JANUVIA ) 100 MG tablet Take 1 tablet (100 mg total) by mouth daily. 03/26/23   Rodney Clamp, MD    Allergies: Patient has no known allergies.    Review of Systems  Constitutional:  Negative for fever.  Respiratory:  Negative for shortness of breath.   Cardiovascular:  Negative for chest pain.  Neurological:  Positive for speech difficulty, light-headedness and headaches.  All other systems reviewed and are negative.   Updated Vital Signs BP (!) 159/69   Pulse 75   Resp 16   SpO2 99%   Physical Exam Vitals and nursing note reviewed.  Constitutional:      Appearance: She is well-developed. She is obese.     Comments: Chronically ill-appearing, no acute distress  HENT:     Head: Normocephalic and atraumatic.     Mouth/Throat:     Mouth: Mucous membranes are moist.   Eyes:     Pupils: Pupils are equal, round, and reactive to light.    Cardiovascular:     Rate and Rhythm: Normal rate and regular rhythm.     Heart sounds: Normal heart sounds.  Pulmonary:     Effort: Pulmonary effort is normal. No respiratory distress.     Breath sounds: No wheezing.  Abdominal:     General: Bowel sounds are normal.     Tenderness: There is no abdominal tenderness.   Musculoskeletal:     Cervical back: Neck supple.   Skin:    General: Skin is warm and dry.   Neurological:     Mental Status: She is alert and oriented to person, place, and time.     Comments: Cranial nerves II through XII intact, no aphasia noted on my exam, no drift, 5 out of 5 strength in all 4 extremities  Psychiatric:        Mood and Affect: Mood normal.     (all labs ordered are listed, but only abnormal results are displayed) Labs Reviewed  APTT - Abnormal; Notable for the following components:      Result Value   aPTT 23 (*)    All  other components within normal limits  COMPREHENSIVE METABOLIC PANEL WITH GFR - Abnormal; Notable for the following components:   Potassium 3.0 (*)    Glucose, Bld 244 (*)    BUN 7 (*)    Creatinine, Ser 1.04 (*)    GFR, Estimated 52 (*)    All other components within normal limits  I-STAT CHEM 8, ED - Abnormal; Notable for the following components:   Potassium 2.9 (*)    BUN 7 (*)    Glucose, Bld 244 (*)  Calcium , Ion 1.08 (*)    All other components within normal limits  CBG MONITORING, ED - Abnormal; Notable for the following components:   Glucose-Capillary 211 (*)    All other components within normal limits  ETHANOL  PROTIME-INR  CBC  DIFFERENTIAL  RAPID URINE DRUG SCREEN, HOSP PERFORMED    EKG: None  Radiology: MR BRAIN WO CONTRAST Result Date: 07/02/2023 EXAM: MRI BRAIN WITHOUT CONTRAST 07/02/2023 04:06:20 AM TECHNIQUE: Multiplanar multisequence MRI of the head/brain was performed without the administration of intravenous contrast. COMPARISON: CT head without contrast 07/02/2023 and MRI head without contrast 01/26/2023. CLINICAL HISTORY: Neuro deficit, acute, stroke suspected. FINDINGS: BRAIN AND VENTRICLES: No acute infarct. No intracranial hemorrhage. No mass. No midline shift. No hydrocephalus. The sella is unremarkable. Normal flow voids. Moderate generalized atrophy is most evident in the high parietal lobes bilaterally, stable. ORBITS: No acute abnormality. SINUSES AND MASTOIDS: No acute abnormality. BONES AND SOFT TISSUES: Normal marrow signal. No acute soft tissue abnormality. IMPRESSION: 1. No acute intracranial abnormality. 2. Moderate generalized atrophy, most evident in the high parietal lobes bilaterally, stable. Electronically signed by: Audree Leas MD 07/02/2023 04:18 AM EDT RP Workstation: [id number]   CT HEAD CODE STROKE WO CONTRAST Result Date: 07/02/2023 CLINICAL DATA:  Code stroke. Initial evaluation for acute neuro deficit, stroke suspected. EXAM:  CT HEAD WITHOUT CONTRAST TECHNIQUE: Contiguous axial images were obtained from the base of the skull through the vertex without intravenous contrast. RADIATION DOSE REDUCTION: This exam was performed according to the departmental dose-optimization program which includes automated exposure control, adjustment of the mA and/or kV according to patient size and/or use of iterative reconstruction technique. COMPARISON:  Comparison made with prior studies from 01/26/2023 FINDINGS: Brain: Mild age-related cerebral atrophy with chronic small vessel ischemic disease. No acute intracranial hemorrhage. No acute large vessel territory infarct. No mass lesion or midline shift. Ventricles normal size without hydrocephalus. No extra-axial fluid collection. Vascular: No abnormal hyperdense vessel. Scattered vascular calcifications noted within the carotid siphons. Skull: No acute scalp soft tissue abnormality.  Calvarium intact. Sinuses/Orbits: Globes orbital soft tissues within normal limits. Mild mucosal thickening present about the ethmoidal air cells and maxillary sinuses. Paranasal sinuses are otherwise clear. No mastoid effusion. Other: None. ASPECTS [facility] Stroke Program Early CT Score) - Ganglionic level infarction (caudate, lentiform nuclei, internal capsule, insula, M1-M3 cortex): 7 - Supraganglionic infarction (M4-M6 cortex): 3 Total score (0-10 with 10 being normal): 10 IMPRESSION: 1. No acute intracranial abnormality. 2. ASPECTS is 10. 3. Mild age-related cerebral atrophy with chronic small vessel ischemic disease. These results were communicated to Dr. Arora at 3:32 am on 07/02/2023 by text page via the [facility] messaging system. Electronically Signed   By: Virgia Griffins M.D.   On: 07/02/2023 03:34     Procedures   Medications Ordered in the ED  potassium chloride  10 mEq in 100 mL IVPB (has no administration in time range)    Clinical Course as of 07/02/23 0425  Fri Jul 02, 2023  0420 Spoke with  neurology, Dr. Bonnita Buttner.  No obvious stroke.  Recommends obs admission for EEG given shaking episodes. [CH]    Clinical Course User Index [CH] Maikol Grassia, Vonzella Guernsey, MD                                 Medical Decision Making Amount and/or Complexity of Data Reviewed Labs: ordered. Radiology: ordered.  Risk Prescription drug management.   This patient presents  to the ED for concern of code stroke, this involves an extensive number of treatment options, and is a complaint that carries with it a high risk of complications and morbidity.  I considered the following differential and admission for this acute, potentially life threatening condition.  The differential diagnosis includes CVA, metabolic derangement, seizure  MDM:    This is an 88 year old female who presents with lightheadedness, difficulty walking, some word finding difficulty.  She is nontoxic.  Vital signs are notable for hypertension for EMS.  She has no focal deficits noted on exam.  No appreciable speech deficits.  She was taken to the CT scanner emergently.  CT imaging without acute bleed.  MRI obtained and without obvious stroke.  Per neurology, she had another episode of shaking while in the CT scanner that self resolved.  Lab work is only really remarkable for mild hypokalemia.  This was replaced.  Given recurrent shaking episodes, neurology recommends observation admission and EEG.  (Labs, imaging, consults)  Labs: I Ordered, and personally interpreted labs.  The pertinent results include: Stroke panel  Imaging Studies ordered: I ordered imaging studies including CT, MRI I independently visualized and interpreted imaging. I agree with the radiologist interpretation  Additional history obtained from EMS.  External records from outside source obtained and reviewed including prior evaluations  Cardiac Monitoring: The patient was maintained on a cardiac monitor.  If on the cardiac monitor, I personally viewed and  interpreted the cardiac monitored which showed an underlying rhythm of: Sinus  Reevaluation: After the interventions noted above, I reevaluated the patient and found that they have :stayed the same  Social Determinants of Health:  lives independently  Disposition: Admit  Co morbidities that complicate the patient evaluation  Past Medical History:  Diagnosis Date   ALLERGIC RHINITIS    Allergy    ANXIETY    ARM PAIN, RIGHT    BENIGN POSITIONAL VERTIGO    Carcinoma in situ of colon    CONSTIPATION    CYSTOCELE WITH INCOMPLETE UTERINE PROLAPSE    Diabetes mellitus without complication (HCC)    ESOPHAGEAL STRICTURE    FATIGUE    FLANK PAIN, LEFT    Headache(784.0)    HIP PAIN, LEFT    HYPERCHOLESTEROLEMIA    HYPERGLYCEMIA    HYPERTENSION, BENIGN ESSENTIAL 03/03/2007   LEG EDEMA, BILATERAL    Microscopic hematuria    OBESITY    OSTEOARTHRITIS, KNEE    OSTEOPOROSIS    Peripheral neuropathy, idiopathic    Problems with swallowing and mastication    SINUSITIS, RECURRENT    SPRAIN&STRAIN OTH SPEC SITES SHOULDER&UPPER ARM      Medicines Meds ordered this encounter  Medications   potassium chloride  10 mEq in 100 mL IVPB    I have reviewed the patients home medicines and have made adjustments as needed  Problem List / ED Course: Problem List Items Addressed This Visit   None Visit Diagnoses       Stroke-like symptoms    -  Primary     Hypokalemia                    Final diagnoses:  Stroke-like symptoms  Hypokalemia    ED Discharge Orders     None          Rory Collard, MD 07/02/23 [id number]

## 2023-07-02 NOTE — Consult Note (Signed)
 NEUROLOGY CONSULT NOTE   Date of service: July 02, 2023 Patient Name: Casey [name] MRN:  [id number] DOB:  07-24-1935 Chief Complaint: Code stroke-dizziness, aphasia Requesting Provider: Rory Collard, MD  History of Present Illness  Casey [name] is a 88 y.o. female with hx of hypertension, anxiety, depression, hyperlipidemia, poor compliance with medications, dementia without behavioral disturbance, chronic vertigo presented to the ER for evaluation of strokelike symptoms of dizziness and aphasia. Last known well at 9 PM on 07/01/2023 when she went to bed and woke up sometime in the middle of the night and was having trouble walking and possibly had a fall.  EMS was called via the life alert system.  When they got there, she complained of headache that she had been having prior to going to bed involving the back of her head as well as dizziness that she describes as a sensation of lightheadedness and about to pass out.  Upon EMS examination, she had trouble getting her words out.  She had no focal weakness.  EMS also reported whole body tremulousness which almost look like a seizure but she was able to speak through it as the ambulance were pulling in towards the hospital.  Lasted a few seconds.  Had another episode of what looked like whole body shaking in the CT scanner.  Resolved without intervention no postictal phase  Due to the aphasia, and headache, code stroke was activated in the field.  LKW: 9 PM 07/01/2023 Modified rankin score: Unable to reliably ascertain IV Thrombolysis: Outside the window EVT: Negative MRI.  Advanced imaging not performed due to low suspicion for stroke and eventually stat MRI being negative for stroke.  NIHSS components Score: Comment  1a Level of Conscious 0[x]  1[]  2[]  3[]      1b LOC Questions 0[x]  1[]  2[]       1c LOC Commands 0[x]  1[]  2[]       2 Best Gaze 0[x]  1[]  2[]       3 Visual 0[x]  1[]  2[]  3[]      4 Facial Palsy 0[x]  1[]  2[]  3[]      5a Motor  Arm - left 0[x]  1[]  2[]  3[]  4[]  UN[]    5b Motor Arm - Right 0[x]  1[]  2[]  3[]  4[]  UN[]    6a Motor Leg - Left 0[]  1[]  2[x]  3[]  4[]  UN[]    6b Motor Leg - Right 0[]  1[]  2[x]  3[]  4[]  UN[]    7 Limb Ataxia 0[x]  1[]  2[]  UN[]      8 Sensory 0[x]  1[]  2[]  UN[]      9 Best Language 0[x]  1[]  2[]  3[]      10 Dysarthria 0[x]  1[]  2[]  UN[]      11 Extinct. and Inattention 0[x]  1[]  2[]       TOTAL: 4      ROS  Comprehensive ROS performed and pertinent positives documented in HPI   Past History   Past Medical History:  Diagnosis Date   ALLERGIC RHINITIS    Allergy    ANXIETY    ARM PAIN, RIGHT    BENIGN POSITIONAL VERTIGO    Carcinoma in situ of colon    CONSTIPATION    CYSTOCELE WITH INCOMPLETE UTERINE PROLAPSE    Diabetes mellitus without complication (HCC)    ESOPHAGEAL STRICTURE    FATIGUE    FLANK PAIN, LEFT    Headache(784.0)    HIP PAIN, LEFT    HYPERCHOLESTEROLEMIA    HYPERGLYCEMIA    HYPERTENSION, BENIGN ESSENTIAL 03/03/2007   LEG EDEMA, BILATERAL    Microscopic hematuria  OBESITY    OSTEOARTHRITIS, KNEE    OSTEOPOROSIS    Peripheral neuropathy, idiopathic    Problems with swallowing and mastication    SINUSITIS, RECURRENT    SPRAIN&STRAIN OTH SPEC SITES SHOULDER&UPPER ARM     Past Surgical History:  Procedure Laterality Date   BIOPSY  02/06/2022   Procedure: BIOPSY;  Surgeon: Felecia Hopper, MD;  Location: [facility];  Service: Gastroenterology;;   COLONOSCOPY     ESOPHAGOGASTRODUODENOSCOPY (EGD) WITH PROPOFOL  N/A 02/06/2022   Procedure: ESOPHAGOGASTRODUODENOSCOPY (EGD) WITH PROPOFOL ;  Surgeon: Felecia Hopper, MD;  Location: [facility];  Service: Gastroenterology;  Laterality: N/A;   KNEE ARTHROPLASTY Right 07/24/2020   Procedure: COMPUTER ASSISTED TOTAL KNEE ARTHROPLASTY;  Surgeon: Adonica Hoose, MD;  Location: [facility];  Service: Orthopedics;  Laterality: Right;   LEFT HEART CATHETERIZATION WITH CORONARY ANGIOGRAM N/A 12/02/2010   Procedure: LEFT HEART  CATHETERIZATION WITH CORONARY ANGIOGRAM;  Surgeon: Peter M Swaziland, MD;  Location: [facility] CATH LAB;  Service: Cardiovascular;:: NORMAL CORONARY ARTERIES - NORMAL LV FUNCTION & EDP.     TRANSTHORACIC ECHOCARDIOGRAM  11/2010   Normal EF 60-65%. No RWMA.  Mild LAD dilation c/w Gr 1 DD.  -- PRETTY NORMAL ECHO    Family History: Family History  Problem Relation Age of Onset   Coronary artery disease Mother 26   Cancer Father 63   Heart disease Father    Colon cancer Other    Diabetes Other    Coronary artery disease Sister    Diabetes type II Sister    Cancer Sister    Coronary artery disease Brother    Kidney disease Brother    Coronary artery disease Brother    Breast cancer Neg Hx     Social History  reports that she has never smoked. She has never used smokeless tobacco. She reports that she does not drink alcohol  and does not use drugs.  No Known Allergies  Medications  No current facility-administered medications for this encounter.  Current Outpatient Medications:    Accu-Chek Softclix Lancets lancets, USE AS DIRECTED IN THE MORNING, AT NOON, AND AT BEDTIME, Disp: 100 each, Rfl: 1   amLODipine  (NORVASC ) 2.5 MG tablet, Take 2 tablets (5 mg total) by mouth daily., Disp: 180 tablet, Rfl: 3   atorvastatin  (LIPITOR) 10 MG tablet, Take 1 tablet (10 mg total) by mouth daily., Disp: 90 tablet, Rfl: 3   azelastine  (ASTELIN ) 0.1 % nasal spray, INSTILL 1 SPRAY INTO BOTH NOSTRILS TWICE DAILY, Disp: 30 mL, Rfl: 12   azelastine  (OPTIVAR ) 0.05 % ophthalmic solution, Place 1 drop into both eyes 2 (two) times daily., Disp: , Rfl:    Blood Glucose Monitoring Suppl DEVI, 1 each by Does not apply route in the morning, at noon, and at bedtime. May substitute to any manufacturer covered by patient's insurance.Dx E11.29,, Disp: 1 each, Rfl: 0   buPROPion  (WELLBUTRIN  XL) 150 MG 24 hr tablet, Take 1 tablet (150 mg total) by mouth daily., Disp: 90 tablet, Rfl: 3   busPIRone  (BUSPAR ) 10 MG tablet, Take 1  tablet (10 mg total) by mouth 2 (two) times daily., Disp: 180 tablet, Rfl: 3   Carboxymethylcellul-Glycerin  (REFRESH OPTIVE) 1-0.9 % GEL, Place 1 drop into both eyes daily., Disp: , Rfl:    donepezil  (ARICEPT ) 10 MG tablet, Take 1 tablet (10 mg total) by mouth at bedtime., Disp: 90 tablet, Rfl: 0   FLUoxetine  (PROZAC ) 20 MG tablet, Take 1 tablet (20 mg total) by mouth daily., Disp: 90 tablet, Rfl: 3  fluticasone  (FLONASE ) 50 MCG/ACT nasal spray, Place 1 spray into both nostrils daily for 7 days., Disp: 9.9 mL, Rfl: 2   furosemide  (LASIX ) 40 MG tablet, Take 1 tablet (40 mg total) by mouth daily., Disp: 90 tablet, Rfl: 0   gabapentin  (NEURONTIN ) 300 MG capsule, TAKE 1 CAPSULE(300 MG) BY MOUTH THREE TIMES DAILY, Disp: 90 capsule, Rfl: 3   glucose blood (ACCU-CHEK GUIDE) test strip, USE AS DIRECTED IN THE MORNING, AT NOON, AND AT BEDTIME, Disp: 100 strip, Rfl: 4   HYDROcodone -acetaminophen  (NORCO/VICODIN) 5-325 MG tablet, Take 1 tablet by mouth every 8 (eight) hours as needed for moderate pain (pain score 4-6). For chronic pain, Disp: 90 tablet, Rfl: 0   JARDIANCE  25 MG TABS tablet, TAKE 1 TABLET(25 MG) BY MOUTH DAILY BEFORE BREAKFAST, Disp: 90 tablet, Rfl: 1   meclizine  (ANTIVERT ) 25 MG tablet, Take 1 tablet (25 mg total) by mouth 3 (three) times daily as needed for dizziness., Disp: 30 tablet, Rfl: 0   Multiple Vitamins-Minerals (CERTAVITE/ANTIOXIDANTS) TABS, Take 1 tablet by mouth at bedtime., Disp: 130 tablet, Rfl: 0   pantoprazole  (PROTONIX ) 40 MG tablet, Take 1 tablet (40 mg total) by mouth daily., Disp: 90 tablet, Rfl: 3   polyethylene glycol powder (GLYCOLAX /MIRALAX ) 17 GM/SCOOP powder, Take 17 g by mouth daily. (Patient taking differently: Take 17 g by mouth daily as needed for mild constipation or moderate constipation.), Disp: 238 g, Rfl: 0   potassium chloride  SA (KLOR-CON  M) 20 MEQ tablet, TAKE 1 TABLET(20 MEQ) BY MOUTH DAILY, Disp: 30 tablet, Rfl: 5   senna-docusate (SENOKOT-S) 8.6-50 MG  tablet, Take 1 tablet by mouth at bedtime as needed for mild constipation., Disp: 30 tablet, Rfl: 0   sitaGLIPtin  (JANUVIA ) 100 MG tablet, Take 1 tablet (100 mg total) by mouth daily., Disp: 90 tablet, Rfl: 3  Vitals   There were no vitals filed for this visit.  There is no height or weight on file to calculate BMI.   Physical Exam  General: Awake alert in no distress HEENT: Normocephalic atraumatic Lungs: Clear Cardiovascular: Regular rhythm Neurological exam She is awake alert oriented x 3. No evidence of dysarthria No evidence of aphasia on my examination This is changed from what EMS saw on scene.  For them, she was not able to repeat sentences and was having word finding difficulty. Cranial nerves II to XII intact Motor examination with no drift in the upper extremities.  Bilateral upper extremities drift to the bed with her pushing me down when I am trying to raise her leg up.  Question some volitional component to this. Sensation intact to light touch Coordination examination the upper extremity with no dysmetria.  Lower extremity coordination difficult to assess  Labs/Imaging/Neurodiagnostic studies   CBC:  Recent Labs  Lab July 23, 2023 0319 Jul 23, 2023 0320  WBC 8.3  --   NEUTROABS 3.6  --   HGB 12.1 12.9  HCT 36.9 38.0  MCV 87.2  --   PLT 253  --    Basic Metabolic Panel:  Lab Results  Component Value Date   NA 139 07/23/2023   K 2.9 (L) 07-23-23   CO2 24 2023-07-23   GLUCOSE 244 (H) 07/23/2023   BUN 7 (L) July 23, 2023   CREATININE 1.00 07/23/23   CALCIUM  9.3 2023-07-23   GFRNONAA 52 (L) 2023-07-23   GFRAA 66 01/03/2020   Lipid Panel:  Lab Results  Component Value Date   LDLCALC 111 (H) 05/31/2022   HgbA1c:  Lab Results  Component Value Date  HGBA1C 8.5 (A) 06/29/2023   Alcohol  Level     Component Value Date/Time   [facility] <15 07/02/2023 0320   INR  Lab Results  Component Value Date   INR 1.1 07/02/2023   APTT  Lab Results  Component Value  Date   APTT 23 (L) 07/02/2023   CT Head without contrast(Personally reviewed): Aspects 10  MRI Brain(Personally reviewed): Negative for acute stroke  ASSESSMENT   Casey [name] is a 88 y.o. female with significant history of anxiety and depression, hypertension hyperlipidemia noncompliance to medications presenting for evaluation of sudden onset of dizziness, and on EMS evaluation, difficulty with word finding which has since resolved.  She also complained of a headache which she had when she went to bed around 9 PM which was her last known well. She had couple of episodes of what looked like whole body shaking but she had eyelid fluttering and was able to talk through when having these episodes.  Stat CT head unremarkable.  Difficult to obtain IV access hence decided to proceed straight for stat MRI to rule out stroke.  Stat MRI on my review is negative for acute stroke.  Current presentation of strokelike symptoms is likely appears functional especially in the setting of negative MRI.  That said, she had some shaking episodes and I would recommend following this up with an EEG.  Also has some metabolic derangements-would benefit from admission to get all that sorted out.  Impression: Dizziness and word finding difficulty-resolved. Examination concerning for functional neurological disorder but had episode of shaking-would recommend EEG to eval for seizures   RECOMMENDATIONS  Would recommend admission for observation Correction of hypokalemia per primary team. Routine EEG No need for antiepileptics for right now Maintain seizure precautions Supportive care per primary team We will follow the EEG results with you. Plan d/w Dr Carylon Claude ______________________________________________________________________    Signed, Casey Francis, MD [facility]

## 2023-07-02 NOTE — TOC Initial Note (Signed)
 Transition of Care [facility]) - Initial/Assessment Note    Patient Details  Name: Casey [name] MRN: [id number] Date of Birth: 12/04/1935  [facility] CM/SW Contact:    Jonathan Neighbor, RN Phone Number: 07/02/2023, 3:46 PM  Clinical Narrative:                  Pt is from home alone. Her niece comes daily to assist with ADL's and chores.  Pt manages her own medications. She states she takes them correctly.  Niece provides needed transportation. Awaiting therapy evals.  TOC following.  Expected Discharge Plan:  (TBD) Barriers to Discharge: Continued Medical Work up   Patient Goals and CMS Choice            Expected Discharge Plan and Services   Discharge Planning Services: CM Consult   Living arrangements for the past 2 months: Single Family Home                                      Prior Living Arrangements/Services Living arrangements for the past 2 months: Single Family Home Lives with:: Self Patient language and need for interpreter reviewed:: Yes Do you feel safe going back to the place where you live?: Yes          Current home services: DME (cane/ shower seat/ walker) Criminal Activity/Legal Involvement Pertinent to Current Situation/Hospitalization: No - Comment as needed  Activities of Daily Living      Permission Sought/Granted                  Emotional Assessment Appearance:: Appears stated age Attitude/Demeanor/Rapport: Engaged Affect (typically observed): Accepting Orientation: : Oriented to Self, Oriented to Place, Oriented to  Time, Oriented to Situation   Psych Involvement: No (comment)  Admission diagnosis:  Hypokalemia [E87.6] Episode of shaking [R25.1] Stroke-like symptoms [R29.90] Patient Active Problem List   Diagnosis Date Noted   Episode of shaking 07/02/2023   CKD stage 3a, GFR 45-59 ml/min (HCC) 07/02/2023   Leg edema 08/04/2022   Poor compliance with medication 06/11/2022   Chronic painful diabetic  neuropathy (HCC) 06/11/2022   Debility 04/29/2022   Hypokalemia 04/21/2022   Renal cyst, Bilateral 02/12/2022   Chronic pain 12/26/2021   Depression, major, single episode, moderate (HCC) 09/26/2021   Insomnia 09/26/2021   Dementia without behavioral disturbance (HCC) 01/03/2020   PUD (peptic ulcer disease) 07/17/2019   Vitamin B12 deficiency 07/14/2017   Osteoarthritis 07/07/2013   Type 2 diabetes mellitus with microalbuminuria, without long-term current use of insulin  (HCC) 05/26/2012   Long Q-T syndrome 11/26/2010   CYSTOCELE WITH INCOMPLETE UTERINE PROLAPSE 03/10/2010   MICROSCOPIC HEMATURIA 07/29/2009   Sinusitis, chronic 02/23/2008   BENIGN POSITIONAL VERTIGO 04/25/2007   Dyslipidemia associated with type 2 diabetes mellitus (HCC) 03/03/2007   GAD (generalized anxiety disorder) 03/03/2007   Hypertension associated with diabetes (HCC) 03/03/2007   Allergic rhinitis 03/03/2007   Osteoporosis 03/03/2007   PCP:  Rodney Clamp, MD Pharmacy:   [facility] [phone] [place] [address], [place] - [address] AT [address] [place] [address] Phone: [phone] Fax: [phone]  [facility] #[id number] [place], [place] - [address] AT [address] [place] [address] Phone: [phone] Fax: [phone]  [facility] [address] [place] [address] Phone: [phone] Fax: [phone]     Social Drivers of  Health (SDOH) Social History: SDOH Screenings   Food Insecurity: No Food Insecurity (04/29/2023)  Housing: Unknown (04/29/2023)  Transportation Needs: No Transportation Needs (04/29/2023)  Utilities: Not At Risk (04/29/2023)  Alcohol  Screen: Low Risk  (04/29/2023)  Depression (PHQ2-9): High Risk (06/29/2023)  Financial Resource Strain: Low Risk  (04/29/2023)  Physical Activity: Insufficiently Active (04/29/2023)  Social Connections: Moderately Isolated (04/29/2023)  Stress: Stress Concern  Present (04/29/2023)  Tobacco Use: Low Risk  (07/02/2023)  Health Literacy: Adequate Health Literacy (04/29/2023)   SDOH Interventions:     Readmission Risk Interventions     No data to display

## 2023-07-02 NOTE — ED Triage Notes (Addendum)
 Pt presents to the ED with c/o headache and tremors that woke her up at 0237. Pt states she went to bed around 2100. Pt reports attempting to walk and fell d/t weakness. Pt aslo stuttering when trying to talk to EMS. Moving all extremities for them. Upon arrival pt talking clearly but not able to keep legs lifted. NIHSS 4 d/t leg weakness. Pt also noted to be rocking body from side to side. When asked if she hurts pt reports I can not help the moving.

## 2023-07-02 NOTE — Progress Notes (Signed)
 EEG complete - results pending

## 2023-07-02 NOTE — Plan of Care (Signed)
 EEG normal. No further inpatient neurological workup at this time Please call inpatient neurology with questions as needed.   -- Tona Francis, MD Neurologist [facility]

## 2023-07-02 NOTE — Code Documentation (Signed)
 Stroke Response Nurse Documentation Code Documentation  Casey [name] is a 88 y.o. female arriving to [facility]  via [facility] on 07/02/23 with past medical hx of hypertension, depression, dementia with behavioral disturbance and vertigo . On No antithrombotic. Code stroke was activated by EMS.   Patient from home where she was LKW at 2100 and now complaining of headache, dizziness, balance issues and aphasia. she went to bed and woke up sometime in the middle of the night and was having trouble walking and possibly had a fall. EMS was called via the life alert system. When they got there, she complained of headache that she had been having prior to going to bed involving the back of her head as well as dizziness that she describes as a sensation of lightheadedness and about to pass out. Patient with multiple episodes of full body shaking, able to follow commands and respond during them.   Stroke team at the bedside on patient arrival. Labs drawn and patient cleared for CT by Dr. Carylon Claude. Patient to CT with team. NIHSS 4, see documentation for details and code stroke times. Patient with bilateral leg weakness on exam. The following imaging was completed:  CT Head and MRI. Patient is not a candidate for IV Thrombolytic due to outside window. Patient is not a candidate for IR due to no signs of LVO.   Care Plan:  Code stroke canceled. Plan for admission and EEG.    Bedside handoff with ED RN Danford Dupes Jamye Balicki  Stroke Response RN

## 2023-07-02 NOTE — Procedures (Signed)
 Patient Name: JULIEN BERRYMAN  MRN: [id number]  Epilepsy Attending: Arleene Lack  Referring Physician/Provider: Walton Guppy, MD  Date: 07/02/2023  Duration: 25.12 mins  Patient history: 88 y.o. female with significant history of anxiety and depression, hypertension hyperlipidemia noncompliance to medications presenting for evaluation of sudden onset of dizziness, and on EMS evaluation, difficulty with word finding which has since resolved.  EEG to evaluate for seizure  Level of alertness: Awake  AEDs during EEG study: GBP  Technical aspects: This EEG study was done with scalp electrodes positioned according to the 10-20 International system of electrode placement. Electrical activity was reviewed with band pass filter of 1-70Hz , sensitivity of 7 uV/mm, display speed of 32mm/sec with a 60Hz  notched filter applied as appropriate. EEG data were recorded continuously and digitally stored.  Video monitoring was available and reviewed as appropriate.  Description: The posterior dominant rhythm consists of 9 Hz activity of moderate voltage (25-35 uV) seen predominantly in posterior head regions, symmetric and reactive to eye opening and eye closing.  Hyperventilation and photic stimulation were not performed.     There was significant eye flutter artifact during the study.  IMPRESSION: This study is within normal limits. No seizures or epileptiform discharges were seen throughout the recording.  A normal interictal EEG does not exclude the diagnosis of epilepsy.   Robertt Buda O Walter Grima

## 2023-07-02 NOTE — ED Notes (Signed)
 This RN at bedside to start pt mag. Pt able to move herself up in the bed when asked. Pt also follows all commands when asked as well. When asked to lift up her arms, pt will struggle to do so. But on her own pt will lift up her arms. When asked to place them down, pt will struggle to do so. Then relaxed them a little bit later when asked to do another task. Pt Aox4. Pt also asked to used the bedpan at this time as well.

## 2023-07-02 NOTE — H&P (Signed)
 History and Physical    Casey [name] UJW:[id number] DOB: Apr 18, 1935 DOA: 07/02/2023  PCP: Rodney Clamp, MD   Patient coming from: Home   Chief Complaint: Headache, shaking, dizziness, speech difficulty   HPI: Casey [name] is a 88 y.o. female with medical history significant for hypertension, type 2 diabetes mellitus, CKD 3A, dementia, anxiety, chronic pain, and poor adherence with her medications, now presenting with headache, dizziness, speech difficulty, and shaking.  Patient reports going to bed last night at approximately 9 PM with a headache, but was otherwise in her usual state.  She woke up early this morning and was having difficulty walking, persistent and posterior headache, and dizziness.  She contacted EMS who noted that she was having speech difficulty.  EMS also observed generalized shaking.  Patient maintained awareness and was talking during the shaking episode.  ED Course: Upon arrival to the ED, patient is found to be saturating well on room air with normal HR and stable BP.  Labs are most notable for potassium 3.0, glucose 244, creatinine 1.04, and normal WBC.  No acute intracranial abnormalities are noted on head CT or MRI brain.  She had another self-limited shaking episode while in the ED.  Patient was evaluated by neurology in the emergency department.  She was given 30 mill equivalents IV potassium.  Review of Systems:  All other systems reviewed and apart from HPI, are negative.  Past Medical History:  Diagnosis Date   ALLERGIC RHINITIS    Allergy    ANXIETY    ARM PAIN, RIGHT    BENIGN POSITIONAL VERTIGO    Carcinoma in situ of colon    CONSTIPATION    CYSTOCELE WITH INCOMPLETE UTERINE PROLAPSE    Diabetes mellitus without complication (HCC)    ESOPHAGEAL STRICTURE    FATIGUE    FLANK PAIN, LEFT    Headache(784.0)    HIP PAIN, LEFT    HYPERCHOLESTEROLEMIA    HYPERGLYCEMIA    HYPERTENSION, BENIGN ESSENTIAL 03/03/2007   LEG EDEMA, BILATERAL     Microscopic hematuria    OBESITY    OSTEOARTHRITIS, KNEE    OSTEOPOROSIS    Peripheral neuropathy, idiopathic    Problems with swallowing and mastication    SINUSITIS, RECURRENT    SPRAIN&STRAIN OTH SPEC SITES SHOULDER&UPPER ARM     Past Surgical History:  Procedure Laterality Date   BIOPSY  02/06/2022   Procedure: BIOPSY;  Surgeon: Felecia Hopper, MD;  Location: [facility];  Service: Gastroenterology;;   COLONOSCOPY     ESOPHAGOGASTRODUODENOSCOPY (EGD) WITH PROPOFOL  N/A 02/06/2022   Procedure: ESOPHAGOGASTRODUODENOSCOPY (EGD) WITH PROPOFOL ;  Surgeon: Felecia Hopper, MD;  Location: [facility];  Service: Gastroenterology;  Laterality: N/A;   KNEE ARTHROPLASTY Right 07/24/2020   Procedure: COMPUTER ASSISTED TOTAL KNEE ARTHROPLASTY;  Surgeon: Adonica Hoose, MD;  Location: [facility];  Service: Orthopedics;  Laterality: Right;   LEFT HEART CATHETERIZATION WITH CORONARY ANGIOGRAM N/A 12/02/2010   Procedure: LEFT HEART CATHETERIZATION WITH CORONARY ANGIOGRAM;  Surgeon: Peter M Swaziland, MD;  Location: [facility] CATH LAB;  Service: Cardiovascular;:: NORMAL CORONARY ARTERIES - NORMAL LV FUNCTION & EDP.     TRANSTHORACIC ECHOCARDIOGRAM  11/2010   Normal EF 60-65%. No RWMA.  Mild LAD dilation c/w Gr 1 DD.  -- PRETTY NORMAL ECHO    Social History:   reports that she has never smoked. She has never used smokeless tobacco. She reports that she does not drink alcohol  and does not use drugs.  No Known Allergies  Family History  Problem Relation Age of Onset   Coronary artery disease Mother 60   Cancer Father 36   Heart disease Father    Colon cancer Other    Diabetes Other    Coronary artery disease Sister    Diabetes type II Sister    Cancer Sister    Coronary artery disease Brother    Kidney disease Brother    Coronary artery disease Brother    Breast cancer Neg Hx      Prior to Admission medications   Medication Sig Start Date End Date Taking? Authorizing Provider  Accu-Chek Softclix  Lancets lancets USE AS DIRECTED IN THE MORNING, AT NOON, AND AT BEDTIME 11/23/22   Rodney Clamp, MD  amLODipine  (NORVASC ) 2.5 MG tablet Take 2 tablets (5 mg total) by mouth daily. 07/16/22   Rodney Clamp, MD  atorvastatin  (LIPITOR) 10 MG tablet Take 1 tablet (10 mg total) by mouth daily. 10/15/22   Rodney Clamp, MD  azelastine  (ASTELIN ) 0.1 % nasal spray INSTILL 1 SPRAY INTO BOTH NOSTRILS TWICE DAILY 09/07/22   Rodney Clamp, MD  azelastine  (OPTIVAR ) 0.05 % ophthalmic solution Place 1 drop into both eyes 2 (two) times daily.    [provider]  Blood Glucose Monitoring Suppl DEVI 1 each by Does not apply route in the morning, at noon, and at bedtime. May substitute to any manufacturer covered by patient's insurance.Dx E11.29, 06/17/22   Rodney Clamp, MD  buPROPion  (WELLBUTRIN  XL) 150 MG 24 hr tablet Take 1 tablet (150 mg total) by mouth daily. 06/29/23   Rodney Clamp, MD  busPIRone  (BUSPAR ) 10 MG tablet Take 1 tablet (10 mg total) by mouth 2 (two) times daily. 09/25/22   Rodney Clamp, MD  Carboxymethylcellul-Glycerin  (REFRESH OPTIVE) 1-0.9 % GEL Place 1 drop into both eyes daily.    [provider]  donepezil  (ARICEPT ) 10 MG tablet Take 1 tablet (10 mg total) by mouth at bedtime. 05/20/23   Rodney Clamp, MD  FLUoxetine  (PROZAC ) 20 MG tablet Take 1 tablet (20 mg total) by mouth daily. 03/26/23   Rodney Clamp, MD  fluticasone  (FLONASE ) 50 MCG/ACT nasal spray Place 1 spray into both nostrils daily for 7 days. 08/29/22 09/05/22  Trish Furl, MD  furosemide  (LASIX ) 40 MG tablet Take 1 tablet (40 mg total) by mouth daily. 06/21/23   Rodney Clamp, MD  gabapentin  (NEURONTIN ) 300 MG capsule TAKE 1 CAPSULE(300 MG) BY MOUTH THREE TIMES DAILY 03/10/23   Rodney Clamp, MD  glucose blood (ACCU-CHEK GUIDE) test strip USE AS DIRECTED IN THE MORNING, AT NOON, AND AT BEDTIME 09/14/22   Rodney Clamp, MD  HYDROcodone -acetaminophen  (NORCO/VICODIN) 5-325 MG tablet Take 1 tablet by mouth  every 8 (eight) hours as needed for moderate pain (pain score 4-6). For chronic pain 06/04/23   Rodney Clamp, MD  JARDIANCE  25 MG TABS tablet TAKE 1 TABLET(25 MG) BY MOUTH DAILY BEFORE BREAKFAST 02/15/23   Rodney Clamp, MD  meclizine  (ANTIVERT ) 25 MG tablet Take 1 tablet (25 mg total) by mouth 3 (three) times daily as needed for dizziness. 04/28/23   Rodney Clamp, MD  Multiple Vitamins-Minerals (CERTAVITE/ANTIOXIDANTS) TABS Take 1 tablet by mouth at bedtime. 06/02/22   Claretta Croft, MD  pantoprazole  (PROTONIX ) 40 MG tablet Take 1 tablet (40 mg total) by mouth daily. 12/25/22   Parker, Caleb M, MD  polyethylene glycol powder (GLYCOLAX /MIRALAX ) 17 GM/SCOOP powder Take 17 g by mouth daily. Patient taking differently:  Take 17 g by mouth daily as needed for mild constipation or moderate constipation. 06/11/22   Rodney Clamp, MD  potassium chloride  SA (KLOR-CON  M) 20 MEQ tablet TAKE 1 TABLET(20 MEQ) BY MOUTH DAILY 02/23/23   Rodney Clamp, MD  senna-docusate (SENOKOT-S) 8.6-50 MG tablet Take 1 tablet by mouth at bedtime as needed for mild constipation. 06/02/22   Claretta Croft, MD  sitaGLIPtin  (JANUVIA ) 100 MG tablet Take 1 tablet (100 mg total) by mouth daily. 03/26/23   Rodney Clamp, MD    Physical Exam: Vitals:   07/02/23 0424 07/02/23 0435  BP: (!) 159/69   Pulse: 75   Resp: 16   SpO2: 99% 98%    Constitutional: NAD, no pallor or diaphoresis   Eyes: PERTLA, lids and conjunctivae normal ENMT: Mucous membranes are moist. Posterior pharynx clear of any exudate or lesions.   Neck: supple, no masses  Respiratory: no wheezing, no crackles. No accessory muscle use.  Cardiovascular: S1 & S2 heard, regular rate and rhythm. No extremity edema.  Abdomen: No tenderness, soft. Bowel sounds active.  Musculoskeletal: no clubbing / cyanosis. No joint deformity upper and lower extremities.   Skin: no significant rashes, lesions, ulcers. Warm, dry, well-perfused. Neurologic: CN 2-12 grossly  intact. Sensation to light touch intact. Strength 5/5 in all 4 limbs. Alert and oriented to person, place, and situation. Restless.  Psychiatric: Calm. Cooperative.    Labs and Imaging on Admission: I have personally reviewed following labs and imaging studies  CBC: Recent Labs  Lab 07/02/23 0319 07/02/23 0320  WBC 8.3  --   NEUTROABS 3.6  --   HGB 12.1 12.9  HCT 36.9 38.0  MCV 87.2  --   PLT 253  --    Basic Metabolic Panel: Recent Labs  Lab 07/02/23 0319 07/02/23 0320  NA 138 139  K 3.0* 2.9*  CL 101 100  CO2 24  --   GLUCOSE 244* 244*  BUN 7* 7*  CREATININE 1.04* 1.00  CALCIUM  9.3  --    GFR: Estimated Creatinine Clearance: 40 mL/min (by C-G formula based on SCr of 1 mg/dL). Liver Function Tests: Recent Labs  Lab 07/02/23 0319  AST 19  ALT 13  ALKPHOS 92  BILITOT 0.6  PROT 6.8  ALBUMIN 3.8   No results for input(s): LIPASE, AMYLASE in the last 168 hours. No results for input(s): AMMONIA in the last 168 hours. Coagulation Profile: Recent Labs  Lab 07/02/23 0319  INR 1.1   Cardiac Enzymes: No results for input(s): CKTOTAL, CKMB, CKMBINDEX, TROPONINI in the last 168 hours. BNP (last 3 results) No results for input(s): PROBNP in the last 8760 hours. HbA1C: Recent Labs    06/29/23 1009  HGBA1C 8.5*   CBG: Recent Labs  Lab 07/02/23 0316  GLUCAP 211*   Lipid Profile: No results for input(s): CHOL, HDL, LDLCALC, TRIG, CHOLHDL, LDLDIRECT in the last 72 hours. Thyroid  Function Tests: No results for input(s): TSH, T4TOTAL, FREET4, T3FREE, THYROIDAB in the last 72 hours. Anemia Panel: No results for input(s): VITAMINB12, FOLATE, FERRITIN, TIBC, IRON, RETICCTPCT in the last 72 hours. Urine analysis:    Component Value Date/Time   COLORURINE YELLOW 01/26/2023 2043   APPEARANCEUR CLEAR 01/26/2023 2043   APPEARANCEUR Clear 04/04/2018 1157   LABSPEC 1.022 01/26/2023 2043   PHURINE 6.0 01/26/2023 2043    GLUCOSEU >=500 (A) 01/26/2023 2043   GLUCOSEU NEGATIVE 04/15/2021 0942   HGBUR SMALL (A) 01/26/2023 2043   HGBUR trace-intact 12/09/2009 0000  BILIRUBINUR NEGATIVE 01/26/2023 2043   BILIRUBINUR Negative 04/04/2018 1157   KETONESUR NEGATIVE 01/26/2023 2043   PROTEINUR 100 (A) 01/26/2023 2043   UROBILINOGEN 0.2 04/15/2021 0942   NITRITE NEGATIVE 01/26/2023 2043   LEUKOCYTESUR NEGATIVE 01/26/2023 2043   Sepsis Labs: @LABRCNTIP (procalcitonin:4,lacticidven:4) )No results found for this or any previous visit (from the past 240 hours).   Radiological Exams on Admission: MR BRAIN WO CONTRAST Result Date: 07/02/2023 EXAM: MRI BRAIN WITHOUT CONTRAST 07/02/2023 04:06:20 AM TECHNIQUE: Multiplanar multisequence MRI of the head/brain was performed without the administration of intravenous contrast. COMPARISON: CT head without contrast 07/02/2023 and MRI head without contrast 01/26/2023. CLINICAL HISTORY: Neuro deficit, acute, stroke suspected. FINDINGS: BRAIN AND VENTRICLES: No acute infarct. No intracranial hemorrhage. No mass. No midline shift. No hydrocephalus. The sella is unremarkable. Normal flow voids. Moderate generalized atrophy is most evident in the high parietal lobes bilaterally, stable. ORBITS: No acute abnormality. SINUSES AND MASTOIDS: No acute abnormality. BONES AND SOFT TISSUES: Normal marrow signal. No acute soft tissue abnormality. IMPRESSION: 1. No acute intracranial abnormality. 2. Moderate generalized atrophy, most evident in the high parietal lobes bilaterally, stable. Electronically signed by: Audree Leas MD 07/02/2023 04:18 AM EDT RP Workstation: [id number]   CT HEAD CODE STROKE WO CONTRAST Result Date: 07/02/2023 CLINICAL DATA:  Code stroke. Initial evaluation for acute neuro deficit, stroke suspected. EXAM: CT HEAD WITHOUT CONTRAST TECHNIQUE: Contiguous axial images were obtained from the base of the skull through the vertex without intravenous contrast. RADIATION DOSE  REDUCTION: This exam was performed according to the departmental dose-optimization program which includes automated exposure control, adjustment of the mA and/or kV according to patient size and/or use of iterative reconstruction technique. COMPARISON:  Comparison made with prior studies from 01/26/2023 FINDINGS: Brain: Mild age-related cerebral atrophy with chronic small vessel ischemic disease. No acute intracranial hemorrhage. No acute large vessel territory infarct. No mass lesion or midline shift. Ventricles normal size without hydrocephalus. No extra-axial fluid collection. Vascular: No abnormal hyperdense vessel. Scattered vascular calcifications noted within the carotid siphons. Skull: No acute scalp soft tissue abnormality.  Calvarium intact. Sinuses/Orbits: Globes orbital soft tissues within normal limits. Mild mucosal thickening present about the ethmoidal air cells and maxillary sinuses. Paranasal sinuses are otherwise clear. No mastoid effusion. Other: None. ASPECTS [facility] Stroke Program Early CT Score) - Ganglionic level infarction (caudate, lentiform nuclei, internal capsule, insula, M1-M3 cortex): 7 - Supraganglionic infarction (M4-M6 cortex): 3 Total score (0-10 with 10 being normal): 10 IMPRESSION: 1. No acute intracranial abnormality. 2. ASPECTS is 10. 3. Mild age-related cerebral atrophy with chronic small vessel ischemic disease. These results were communicated to Dr. Arora at 3:32 am on 07/02/2023 by text page via the [facility] messaging system. Electronically Signed   By: Virgia Griffins M.D.   On: 07/02/2023 03:34    EKG: Independently reviewed. Sinus rhythm.   Assessment/Plan   1. Shaking episodes  - Appreciate neurology assessment and recommendations, plan to check EEG, use seizure precautions for now    2. Dizziness and word-finding difficulty  - Resolved  - No acute intracranial findings on MRI    3. Hypokalemia  - Replacing   4. Type II DM  - A1c was 8.5% in June 2025   - Check CBGs and use low-intensity SSI for now    5. Hypertension  - Norvasc     6. CKD 3A  - Appears close to baseline  - Renally-dose medications   7. Anxiety  - Continue Prozac  and Buspar    8. Dementia  -  Continue Aricept , use delirium precautions    DVT prophylaxis: Lovenox   Code Status: Full  Level of Care: Level of care: Telemetry Medical Family Communication: None present   Disposition Plan:  Patient is from: Home  Anticipated d/c is to: TBD Anticipated d/c date is: 07/03/23  Patient currently: Pending EEG  Consults called: Neurology  Admission status: observation     Walton Guppy, MD [facility]  07/02/2023, 4:57 AM

## 2023-07-02 NOTE — Evaluation (Signed)
 Physical Therapy Evaluation Patient Details Name: Casey [name] MRN: [id number] DOB: 1935-11-05 Today's Date: 07/02/2023  History of Present Illness  Patient is an 88 y/o female admitted 07/02/23 due to shaking, dizziness, headache and speech changes.  CTH and MRI negative for acute changes, though admitted with hypokalemia and for EEG to r/o seizure.    PMH includes HTN, HLD, DM, BLE edema, vertigo, dementia with behavioral disturbance.  Clinical Impression  Patient presents with decreased mobility due to generalized weakness, decreased balance, decreased activity tolerance reporting feeling drunk and decreased safety awareness.  Previously living alone using walker and denies falls at home.  Currently min A for stand step to recliner with S for bed mobility and increased time.  She was limited due to feeling drunk and declined further ambulation.  Initiated vestibular assessment though pt with difficulty maintaining gaze and without her glasses from home.  Patient will benefit from skilled PT in the acute setting and from post-acute inpatient rehab (<3 hours/day) if no capable 24 hour assist available for home.         If plan is discharge home, recommend the following: A little help with walking and/or transfers;A little help with bathing/dressing/bathroom;Help with stairs or ramp for entrance;Assistance with cooking/housework;Assist for transportation   Can travel by private vehicle   Yes    Equipment Recommendations None recommended by PT  Recommendations for Other Services       Functional Status Assessment Patient has had a recent decline in their functional status and demonstrates the ability to make significant improvements in function in a reasonable and predictable amount of time.     Precautions / Restrictions Precautions Precautions: Fall      Mobility  Bed Mobility Overal bed mobility: Needs Assistance Bed Mobility: Supine to Sit     Supine to sit: Supervision,  HOB elevated     General bed mobility comments: increased time, no physical help    Transfers Overall transfer level: Needs assistance Equipment used: Rolling walker (2 wheels) Transfers: Sit to/from Stand, Bed to chair/wheelchair/BSC Sit to Stand: Contact guard assist   Step pivot transfers: Min assist       General transfer comment: reports feeling drunk and feels she cannot walk now, assisted to step to recliner with RW and A for balance    Ambulation/Gait               General Gait Details: declned feels drunk  Stairs            Wheelchair Mobility     Tilt Bed    Modified Rankin (Stroke Patients Only) Modified Rankin (Stroke Patients Only) Pre-Morbid Rankin Score: No significant disability Modified Rankin: Moderately severe disability     Balance Overall balance assessment: Needs assistance   Sitting balance-Leahy Scale: Fair Sitting balance - Comments: somewhat nervous though can sit without UE support   Standing balance support: Bilateral upper extremity supported, Reliant on assistive device for balance Standing balance-Leahy Scale: Poor                               Pertinent Vitals/Pain Pain Assessment Pain Assessment: Faces Faces Pain Scale: Hurts a little bit Pain Location: head feels funny Pain Descriptors / Indicators: Heaviness, Discomfort Pain Intervention(s): Monitored during session    Home Living Family/patient expects to be discharged to:: Private residence Living Arrangements: Alone Available Help at Discharge: Friend(s);Family Type of Home: House Home Access: Stairs to enter  Entrance Stairs-Rails: Right;Left Entrance Stairs-Number of Steps: 1 and 1   Home Layout: One level Home Equipment: Pharmacist, hospital (2 wheels);Cane - single point      Prior Function Prior Level of Function : Needs assist             Mobility Comments: uses walker at home ADLs Comments: cooks and clean,  friend drives to store     Extremity/Trunk Assessment   Upper Extremity Assessment Upper Extremity Assessment: Generalized weakness    Lower Extremity Assessment Lower Extremity Assessment: Generalized weakness    Cervical / Trunk Assessment Cervical / Trunk Assessment: Other exceptions Cervical / Trunk Exceptions: moving in bed not true tremors but more like dystonia  Communication   Communication Communication: No apparent difficulties    Cognition Arousal: Alert Behavior During Therapy: WFL for tasks assessed/performed   PT - Cognitive impairments: History of cognitive impairments                         Following commands: Intact       Cueing Cueing Techniques: Verbal cues     General Comments General comments (skin integrity, edema, etc.): twin nephews in the room and supportive, patient relates left her glasses at home, initiated vestibular assessment, though pt with difficulty with stabilzing gaze for testing noting her eyes feel blurry and difficulty with following target for testing and following commands for maintaining gaze on target.    Exercises     Assessment/Plan    PT Assessment Patient needs continued PT services  PT Problem List Decreased activity tolerance;Decreased strength;Decreased balance;Decreased mobility;Decreased knowledge of use of DME       PT Treatment Interventions DME instruction;Gait training;Patient/family education;Functional mobility training;Therapeutic activities;Therapeutic exercise;Balance training;Other (comment) (vestibular rehab)    PT Goals (Current goals can be found in the Care Plan section)  Acute Rehab PT Goals Patient Stated Goal: return to independent PT Goal Formulation: With patient/family Time For Goal Achievement: 07/16/23 Potential to Achieve Goals: Fair    Frequency Min 2X/week     Co-evaluation               AM-PAC PT 6 Clicks Mobility  Outcome Measure Help needed turning from  your back to your side while in a flat bed without using bedrails?: A Little Help needed moving from lying on your back to sitting on the side of a flat bed without using bedrails?: A Little Help needed moving to and from a bed to a chair (including a wheelchair)?: A Little Help needed standing up from a chair using your arms (e.g., wheelchair or bedside chair)?: A Little   Help needed climbing 3-5 steps with a railing? : Total 6 Click Score: 13    End of Session Equipment Utilized During Treatment: Gait belt Activity Tolerance: Patient limited by fatigue Patient left: in chair;with call bell/phone within reach;with chair alarm set;with family/visitor present Nurse Communication: Mobility status PT Visit Diagnosis: Other abnormalities of gait and mobility (R26.89);Other symptoms and signs involving the nervous system (R29.898);Dizziness and giddiness (R42)    Time: 1234-1300 PT Time Calculation (min) (ACUTE ONLY): 26 min   Charges:   PT Evaluation $PT Eval Moderate Complexity: 1 Mod PT Treatments $Therapeutic Activity: 8-22 mins PT General Charges $$ ACUTE PT VISIT: 1 Visit         Abigail Hoff, PT Acute Rehabilitation Services Office:[phone] 07/02/2023   Marley Simmers 07/02/2023, 5:08 PM

## 2023-07-02 NOTE — Care Management Obs Status (Signed)
 MEDICARE OBSERVATION STATUS NOTIFICATION   Patient Details  Name: Casey [name] MRN: [id number] Date of Birth: 20-Oct-1935   Medicare Observation Status Notification Given:  Yes    Jonathan Neighbor, RN 07/02/2023, 3:20 PM

## 2023-07-02 NOTE — Progress Notes (Signed)
 [facility] PLAN OF CARE NOTE Patient: Casey [name] ZOX:[id number]   PCP: Rodney Clamp, MD DOB: 12-Feb-1935   DOA: 07/02/2023   DOS: 07/02/2023    Patient was admitted by my colleague earlier on 07/02/2023. I have reviewed the H&P as well as assessment and plan and agree with the same. Important changes in the plan are listed below.  Plan of care: Principal Problem:   Episode of shaking Active Problems:   GAD (generalized anxiety disorder)   Type 2 diabetes mellitus with microalbuminuria, without long-term current use of insulin  (HCC)   Dementia without behavioral disturbance (HCC)   Depression, major, single episode, moderate (HCC)   Hypokalemia   CKD stage 3a, GFR 45-59 ml/min (HCC) Continues to have coarse body movements without any confusion.  Throughout the episodes patient is able to answer questions. Appreciate neurology consultation. EEG currently pending. Monitor Level of care: Telemetry Medical   Author: Charlean Congress, MD  [facility] 07/02/2023 6:20 PM   If 7PM-7AM, please contact night-coverage at [email or web address]

## 2023-07-02 NOTE — Plan of Care (Signed)
   Problem: Safety: Goal: Ability to remain free from injury will improve Outcome: Progressing   Problem: Skin Integrity: Goal: Risk for impaired skin integrity will decrease Outcome: Progressing

## 2023-07-03 DIAGNOSIS — R251 Tremor, unspecified: Secondary | ICD-10-CM | POA: Diagnosis not present

## 2023-07-03 LAB — CBC
HCT: 38.5 % (ref 36.0–46.0)
Hemoglobin: 12.5 g/dL (ref 12.0–15.0)
MCH: 28.1 pg (ref 26.0–34.0)
MCHC: 32.5 g/dL (ref 30.0–36.0)
MCV: 86.5 fL (ref 80.0–100.0)
Platelets: 299 10*3/uL (ref 150–400)
RBC: 4.45 MIL/uL (ref 3.87–5.11)
RDW: 15.1 % (ref 11.5–15.5)
WBC: 7.1 10*3/uL (ref 4.0–10.5)
nRBC: 0 % (ref 0.0–0.2)

## 2023-07-03 LAB — GLUCOSE, CAPILLARY
Glucose-Capillary: 270 mg/dL — ABNORMAL HIGH (ref 70–99)
Glucose-Capillary: 122 mg/dL — ABNORMAL HIGH (ref 70–99)
Glucose-Capillary: 289 mg/dL — ABNORMAL HIGH (ref 70–99)

## 2023-07-03 LAB — BASIC METABOLIC PANEL WITH GFR
Anion gap: 10 (ref 5–15)
BUN: 7 mg/dL — ABNORMAL LOW (ref 8–23)
CO2: 27 mmol/L (ref 22–32)
Calcium: 9.8 mg/dL (ref 8.9–10.3)
Chloride: 102 mmol/L (ref 98–111)
Creatinine, Ser: 0.97 mg/dL (ref 0.44–1.00)
GFR, Estimated: 57 mL/min — ABNORMAL LOW (ref >60)
Glucose, Bld: 295 mg/dL — ABNORMAL HIGH (ref 70–99)
Potassium: 4.2 mmol/L (ref 3.5–5.1)
Sodium: 139 mmol/L (ref 135–145)

## 2023-07-03 LAB — MAGNESIUM: Magnesium: 1.8 mg/dL (ref 1.7–2.4)

## 2023-07-03 MED ORDER — GABAPENTIN 300 MG PO CAPS
300.0000 mg | ORAL_CAPSULE | Freq: Every day | ORAL | Status: DC
  Administered 2023-07-03 – 2023-07-04 (×2): 300 mg via ORAL
  Filled 2023-07-03 (×2): qty 1

## 2023-07-03 MED ORDER — FUROSEMIDE 20 MG PO TABS
20.0000 mg | ORAL_TABLET | Freq: Every day | ORAL | Status: DC
  Administered 2023-07-04 – 2023-07-05 (×2): 20 mg via ORAL
  Filled 2023-07-03 (×2): qty 1

## 2023-07-03 NOTE — Evaluation (Signed)
 Occupational Therapy Evaluation Patient Details Name: Casey [name] MRN: [id number] DOB: [birth year] Today's Date: 07/03/2023   History of Present Illness   Patient is an 88 y/o female admitted 07/02/23 due to shaking, dizziness, headache and speech changes.  CTH and MRI negative for acute changes, though admitted with hypokalemia and for EEG to r/o seizure.    PMH includes HTN, HLD, DM, BLE edema, vertigo, dementia with behavioral disturbance.     Clinical Impressions Pt admitted based on above, and was seen based on problem list below. PTA pt was living alone and was independent with ADLs and IADLs. Today pt is requiring set up  to CGA for ADLs. Functional transfers are CGA. During initial STS pt experiencing slight posterior lean, d/t dizziness, pt able to self correct. Pt reporting dizziness with both vertical and lateral head turns. Educated pt on compensatory strategies and grounding techniques to assist with balance. D/t limited support available upon d/c and high fall risk recommended post acute rehab prior to d/c home. Pt would benefit from <3 hours of skilled rehab daily. OT will continue to follow acutely to maximize functional independence.     If plan is discharge home, recommend the following:   A little help with walking and/or transfers;A little help with bathing/dressing/bathroom     Functional Status Assessment   Patient has had a recent decline in their functional status and demonstrates the ability to make significant improvements in function in a reasonable and predictable amount of time.     Equipment Recommendations   Other (comment) (Defer to next venue)     Recommendations for Other Services         Precautions/Restrictions   Precautions Precautions: Fall Recall of Precautions/Restrictions: Intact Restrictions Weight Bearing Restrictions Per Provider Order: No     Mobility Bed Mobility Overal bed mobility: Needs Assistance     General bed  mobility comments: Received in recliner    Transfers Overall transfer level: Needs assistance Equipment used: Rolling walker (2 wheels) Transfers: Sit to/from Stand, Bed to chair/wheelchair/BSC Sit to Stand: Contact guard assist     Step pivot transfers: Contact guard assist     General transfer comment: CGA for balance in standing, inital posterior lean, subsided with symptoms of dizziness      Balance Overall balance assessment: Needs assistance Sitting-balance support: No upper extremity supported, Feet supported Sitting balance-Leahy Scale: Fair     Standing balance support: Bilateral upper extremity supported, Reliant on assistive device for balance, During functional activity Standing balance-Leahy Scale: Poor Standing balance comment: reliant on RW     ADL either performed or assessed with clinical judgement   ADL Overall ADL's : Needs assistance/impaired Eating/Feeding: Set up;Sitting   Grooming: Contact guard assist;Standing Grooming Details (indicate cue type and reason): CGA for balance Upper Body Bathing: Set up;Sitting   Lower Body Bathing: Contact guard assist;Sit to/from stand Lower Body Bathing Details (indicate cue type and reason): Pt reaching BLEs with increased effort Upper Body Dressing : Set up;Sitting   Lower Body Dressing: Contact guard assist;Sit to/from stand Lower Body Dressing Details (indicate cue type and reason): Pt reaching BLEs with increased effort and time Toilet Transfer: Contact guard assist;Rolling walker (2 wheels);Ambulation Toilet Transfer Details (indicate cue type and reason): Simulated in room cues for sequencing Toileting- Clothing Manipulation and Hygiene: Contact guard assist;Sit to/from stand       Functional mobility during ADLs: Contact guard assist;Rolling walker (2 wheels) General ADL Comments: Educated on compensatory strategies to prevent dizziness  Vision Baseline Vision/History: 1 Wears glasses Vision  Assessment?: No apparent visual deficits            Pertinent Vitals/Pain Pain Assessment Pain Assessment: Faces Faces Pain Scale: Hurts little more Pain Location: shoulders Pain Descriptors / Indicators: Discomfort Pain Intervention(s): Monitored during session     Extremity/Trunk Assessment Upper Extremity Assessment Upper Extremity Assessment: Generalized weakness   Lower Extremity Assessment Lower Extremity Assessment: Defer to PT evaluation       Communication Communication Communication: No apparent difficulties   Cognition Arousal: Alert Behavior During Therapy: WFL for tasks assessed/performed Cognition: No apparent impairments     Following commands: Intact       Cueing  General Comments   Cueing Techniques: Verbal cues  VSS on RA           Home Living Family/patient expects to be discharged to:: Private residence Living Arrangements: Alone Available Help at Discharge: Friend(s);Family;Available PRN/intermittently (Niece and brother) Type of Home: House Home Access: Stairs to enter [employer] of Steps: 1 and 1 Entrance Stairs-Rails: Right;Left Home Layout: One level     Bathroom Shower/Tub: Chief Strategy Officer: Standard Bathroom Accessibility: Yes How Accessible: Accessible via walker Home Equipment: Shower seat;Rolling Walker (2 wheels);Cane - single point;BSC/3in1          Prior Functioning/Environment Prior Level of Function : Needs assist;Independent/Modified Independent   Mobility Comments: uses walker at home ADLs Comments: cooks and clean, friend drives to store    OT Problem List: Decreased strength;Decreased activity tolerance;Impaired balance (sitting and/or standing);Decreased safety awareness;Decreased knowledge of use of DME or AE   OT Treatment/Interventions: Self-care/ADL training;Therapeutic exercise;Energy conservation;DME and/or AE instruction;Therapeutic activities;Patient/family  education;Balance training      OT Goals(Current goals can be found in the care plan section)   Acute Rehab OT Goals Patient Stated Goal: To not be dizzy OT Goal Formulation: With patient Time For Goal Achievement: 07/17/23 Potential to Achieve Goals: Good   OT Frequency:  Min 2X/week       AM-PAC OT 6 Clicks Daily Activity     Outcome Measure Help from another person eating meals?: None Help from another person taking care of personal grooming?: A Little Help from another person toileting, which includes using toliet, bedpan, or urinal?: A Little Help from another person bathing (including washing, rinsing, drying)?: A Little Help from another person to put on and taking off regular upper body clothing?: A Little Help from another person to put on and taking off regular lower body clothing?: A Little 6 Click Score: 19   End of Session Equipment Utilized During Treatment: Gait belt;Rolling walker (2 wheels) Nurse Communication: Mobility status  Activity Tolerance: Patient tolerated treatment well Patient left: in chair;with call bell/phone within reach;with chair alarm set  OT Visit Diagnosis: Unsteadiness on feet (R26.81);Other abnormalities of gait and mobility (R26.89);Muscle weakness (generalized) (M62.81)                Time: 1610-9604 OT Time Calculation (min): 16 min Charges:  OT General Charges $OT Visit: 1 Visit OT Evaluation $OT Eval Moderate Complexity: 1 Mod  Delmer Ferraris, OT  Acute Rehabilitation Services Office [phone] Secure chat preferred   Mickael Alamo 07/03/2023, 3:32 PM

## 2023-07-03 NOTE — Progress Notes (Signed)
 [facility] Progress Note Patient: Casey [name] YNW:[id number] DOB: January 12, 1936 DOA: 07/02/2023  DOS: the patient was seen and examined on 07/03/2023  Brief Hospital Course: Patient with PMH of HTN, type II DM, CKD 3, dementia, compliance issues presented to the hospital with complaints of headache, dizziness, slurred speech and shaking. Neurology was consulted.  Stroke ruled out.  EEG performed which was also able to rule out a seizure. Likely medication side effect as a cause of her presentation. When patient woke up from her sleep, she began to feel dizzy with a 10/ 10 headache. Pt attempted to walk but fell due to her having a sudden lack of coordination. felt weak all over. she did not hit her head. EMS noted expressive aphasia as well as an ataxic gait.  Presented as code Stroke.  Neurology was consulted.  MRI brain negative for stroke.  Code stroke was canceled.  EEG was also performed which is negative for any active seizures.  Assessment and Plan: Acute ataxia. Symptoms currently appear to have resolved. There was concern for a stroke although CT and MRI negative for any acute stroke. Neurology was consulted.  No further workup recommended. PT OT consulted.  Recommend SNF. Patient currently refusing to go to SNF. Orthostatic vitals are normal. Will monitor.  Tremors. Patient reports shaking episode associated with headache. EEG was performed which is negative for any active seizure or seizure-like activity or epileptogenic foci. Neurology recommends no further workup or treatment. Monitor  Headache. Reports 10 out of 10 headache intermittently occurring starting from her back of her head and going to the forehead area. Would recommend as needed medications.  Hypokalemia. Replace.  Type 2 diabetes mellitus. Hemoglobin A1c was 5.5. Currently on sliding scale insulin .  HTN. On Norvasc .  As well as Lasix .  Continue.  CKD 3. Renal function  baseline. Monitor.  Anxiety. Patient is on Prozac  and Wellbutrin . There is BuSpar  prescription as well as gabapentin . Patient's brother helps her with the medication although suspect that polypharmacy is most likely cause of her presentation with dizziness. Would recommend to reduce the dose of the gabapentin  for now. Also discontinue BuSpar .  Chronic HFpEF. Chronically on Lasix  and potassium. Will reduce the dose of the Lasix . Monitor.  GERD. Continue PPI.  HLD. Continue statin.  Dementia. Patient is a very poor historian given her history of dementia. She is not able to keep the chronology intact on examination. Concern is that she may be suffering from polypharmacy and mistakenly taking medications that she already took or forgetting some medications. Discussed with brother, recommending to monitor her medication intake more closely.  Subjective: Initially reported that she has headache but later on says she does not have currently headache.  Initially reported that she had blurry vision but currently denies.  Initially reported that she had swimmy headedness but then denies.  No chest pain abdominal pain.  Has generalized slow body movement involving her or torso.   Physical Exam: General: in Mild distress, No Rash Cardiovascular: S1 and S2 Present, No Murmur Respiratory: Good respiratory effort, Bilateral Air entry present. No Crackles, No wheezes Abdomen: Bowel Sound present, No tenderness Extremities: No edema Neuro: Alert and oriented x3, no new focal deficit, no asterixis.  Finger-nose-finger normal.  No nystagmus.  Data Reviewed: I have Reviewed nursing notes, Vitals, and Lab results. Since last encounter, pertinent lab results CBC and BMP   . I have ordered test including BMP  .    Disposition: Status is: Observation enoxaparin  (LOVENOX ) injection  40 mg Start: 07/02/23 1000   Family Communication: Discussed with brother on the phone. Level of care: Telemetry  Medical monitor overnight.  Likely medically ready tomorrow. Vitals:   07/03/23 0741 07/03/23 0934 07/03/23 1247 07/03/23 1620  BP: 127/67 127/67 137/69 129/62  Pulse: 76  77 78  Resp: 17   17  Temp: 98.6 F (37 C)   98.1 F (36.7 C)  TempSrc: Oral     SpO2: 97%   98%     Author: Charlean Congress, MD 07/03/2023 7:45 PM  Please look on [email or web address] to find out who is on call.

## 2023-07-03 NOTE — TOC Progression Note (Signed)
 Transition of Care [facility]) - Progression Note    Patient Details  Name: Casey [name] MRN: [id number] Date of Birth: 01-07-1936  Transition of Care [facility]) CM/SW Contact  Carmon Christen, LCSWA Phone Number: 07/03/2023, 11:52 AM  Clinical Narrative:     CSW received consult for possible SNF placement at time of discharge. CSW spoke with patient regarding PT recommendation of SNF placement at time of discharge. Patient reports PTA she comes from home alone. Patient expressed understanding of PT recommendation and politely declined SNF placement at time of discharge. Patient plans to return home when medically stable for dc with support of her niece and twin brothers. Patient interested to speak with CM about home needs.CSW informed CM.  No further questions reported at this time. CSW to continue to follow and assist with discharge planning needs.   Expected Discharge Plan: Home w Home Health Services Barriers to Discharge: Continued Medical Work up  Expected Discharge Plan and Services In-house Referral: Clinical Social Work Discharge Planning Services: CM Consult   Living arrangements for the past 2 months: Single Family Home                                       Social Determinants of Health (SDOH) Interventions SDOH Screenings   Food Insecurity: No Food Insecurity (07/03/2023)  Housing: Unknown (07/03/2023)  Transportation Needs: No Transportation Needs (07/03/2023)  Utilities: Not At Risk (07/03/2023)  Alcohol  Screen: Low Risk  (04/29/2023)  Depression (PHQ2-9): High Risk (06/29/2023)  Financial Resource Strain: Low Risk  (04/29/2023)  Physical Activity: Insufficiently Active (04/29/2023)  Social Connections: Moderately Isolated (07/03/2023)  Stress: Stress Concern Present (04/29/2023)  Tobacco Use: Low Risk  (07/02/2023)  Health Literacy: Adequate Health Literacy (04/29/2023)    Readmission Risk Interventions     No data to display

## 2023-07-04 DIAGNOSIS — R251 Tremor, unspecified: Secondary | ICD-10-CM | POA: Diagnosis not present

## 2023-07-04 LAB — BASIC METABOLIC PANEL WITH GFR
Anion gap: 10 (ref 5–15)
BUN: 15 mg/dL (ref 8–23)
CO2: 26 mmol/L (ref 22–32)
Calcium: 9.7 mg/dL (ref 8.9–10.3)
Chloride: 100 mmol/L (ref 98–111)
Creatinine, Ser: 0.95 mg/dL (ref 0.44–1.00)
GFR, Estimated: 58 mL/min — ABNORMAL LOW (ref >60)
Glucose, Bld: 313 mg/dL — ABNORMAL HIGH (ref 70–99)
Potassium: 4.6 mmol/L (ref 3.5–5.1)
Sodium: 136 mmol/L (ref 135–145)

## 2023-07-04 LAB — GLUCOSE, CAPILLARY
Glucose-Capillary: 297 mg/dL — ABNORMAL HIGH (ref 70–99)
Glucose-Capillary: 138 mg/dL — ABNORMAL HIGH (ref 70–99)
Glucose-Capillary: 281 mg/dL — ABNORMAL HIGH (ref 70–99)
Glucose-Capillary: 124 mg/dL — ABNORMAL HIGH (ref 70–99)

## 2023-07-04 MED ORDER — ACETAMINOPHEN 500 MG PO TABS
500.0000 mg | ORAL_TABLET | Freq: Three times a day (TID) | ORAL | Status: DC
  Administered 2023-07-04 – 2023-07-05 (×3): 500 mg via ORAL
  Filled 2023-07-04 (×4): qty 1

## 2023-07-04 MED ORDER — EMPAGLIFLOZIN 25 MG PO TABS
25.0000 mg | ORAL_TABLET | Freq: Every day | ORAL | Status: DC
  Administered 2023-07-04 – 2023-07-05 (×2): 25 mg via ORAL
  Filled 2023-07-04 (×2): qty 1

## 2023-07-04 MED ORDER — MECLIZINE HCL 12.5 MG PO TABS
12.5000 mg | ORAL_TABLET | Freq: Three times a day (TID) | ORAL | Status: AC
  Administered 2023-07-04 (×2): 12.5 mg via ORAL
  Filled 2023-07-04 (×3): qty 1

## 2023-07-04 NOTE — NC FL2 (Signed)
 [place]  MEDICAID [id number] LEVEL OF CARE FORM     IDENTIFICATION  Patient Name: Casey [name] Birthdate: 1935-04-25 Sex: female Admission Date (Current Location): 07/02/2023  [facility] and [place] Number:  Producer, television/film/video and Address:  [facility] [place], [address], [place], [place] [address]      Provider Number:    Attending Physician Name and Address:  Casey Peru, MD  Relative Name and Phone Number:  Casey [name] [phone]    Current Level of Care: Hospital Recommended Level of Care: Skilled Nursing Facility Prior Approval Number:    Date Approved/Denied:   PASRR Number: pending  Discharge Plan: SNF    Current Diagnoses: Patient Active Problem List   Diagnosis Date Noted   Episode of shaking 07/02/2023   CKD stage 3a, GFR 45-59 ml/min (HCC) 07/02/2023   Leg edema 08/04/2022   Poor compliance with medication 06/11/2022   Chronic painful diabetic neuropathy (HCC) 06/11/2022   Debility 04/29/2022   Hypokalemia 04/21/2022   Renal cyst, Bilateral 02/12/2022   Chronic pain 12/26/2021   Depression, major, single episode, moderate (HCC) 09/26/2021   Insomnia 09/26/2021   Dementia without behavioral disturbance (HCC) 01/03/2020   PUD (peptic ulcer disease) 07/17/2019   Vitamin B12 deficiency 07/14/2017   Osteoarthritis 07/07/2013   Type 2 diabetes mellitus with microalbuminuria, without long-term current use of insulin  (HCC) 05/26/2012   Long Q-T syndrome 11/26/2010   CYSTOCELE WITH INCOMPLETE UTERINE PROLAPSE 03/10/2010   MICROSCOPIC HEMATURIA 07/29/2009   Sinusitis, chronic 02/23/2008   BENIGN POSITIONAL VERTIGO 04/25/2007   Dyslipidemia associated with type 2 diabetes mellitus (HCC) 03/03/2007   GAD (generalized anxiety disorder) 03/03/2007   Hypertension associated with diabetes (HCC) 03/03/2007   Allergic rhinitis 03/03/2007   Osteoporosis 03/03/2007    Orientation RESPIRATION BLADDER Height & Weight     Self, Time,  Place  Normal Continent Weight:   Height:     BEHAVIORAL SYMPTOMS/MOOD NEUROLOGICAL BOWEL NUTRITION STATUS      Continent    AMBULATORY STATUS COMMUNICATION OF NEEDS Skin   Limited Assist Verbally Normal                       Personal Care Assistance Level of Assistance  Bathing, Feeding, Dressing Bathing Assistance: Limited assistance Feeding assistance: Independent Dressing Assistance: Limited assistance     Functional Limitations Info  Sight, Hearing, Speech Sight Info: Adequate Hearing Info: Adequate Speech Info: Adequate    SPECIAL CARE FACTORS FREQUENCY  PT (By licensed PT), OT (By licensed OT)     PT Frequency: 5x per week OT Frequency: 5x per week            Contractures Contractures Info: Not present    Additional Factors Info  Code Status Code Status Info: full             Current Medications (07/04/2023):  This is the current hospital active medication list Current Facility-Administered Medications  Medication Dose Route Frequency Provider Last Rate Last Admin   acetaminophen  (TYLENOL ) tablet 500 mg  500 mg Oral TID Patel, Pranav M, MD       amLODipine  (NORVASC ) tablet 5 mg  5 mg Oral Daily Opyd, Timothy S, MD   5 mg at 07/04/23 0831   atorvastatin  (LIPITOR) tablet 10 mg  10 mg Oral Daily Opyd, Timothy S, MD   10 mg at 07/04/23 0831   donepezil  (ARICEPT ) tablet 10 mg  10 mg Oral QHS Opyd, Timothy S, MD  10 mg at 07/03/23 2109   empagliflozin  (JARDIANCE ) tablet 25 mg  25 mg Oral Daily Patel, Pranav M, MD       enoxaparin  (LOVENOX ) injection 40 mg  40 mg Subcutaneous Q24H Opyd, Timothy S, MD   40 mg at 07/04/23 0831   FLUoxetine  (PROZAC ) capsule 20 mg  20 mg Oral Daily Opyd, Timothy S, MD   20 mg at 07/04/23 1610   furosemide  (LASIX ) tablet 20 mg  20 mg Oral Daily Patel, Pranav M, MD   20 mg at 07/04/23 9604   gabapentin  (NEURONTIN ) capsule 300 mg  300 mg Oral QHS Patel, Pranav M, MD   300 mg at 07/03/23 2109   HYDROcodone -acetaminophen   (NORCO/VICODIN) 5-325 MG per tablet 1 tablet  1 tablet Oral Q6H PRN Opyd, Timothy S, MD   1 tablet at 07/04/23 5409   insulin  aspart (novoLOG ) injection 0-5 Units  0-5 Units Subcutaneous QHS Opyd, Timothy S, MD   3 Units at 07/03/23 2117   insulin  aspart (novoLOG ) injection 0-6 Units  0-6 Units Subcutaneous TID WC Opyd, Timothy S, MD   3 Units at 07/04/23 0830   meclizine  (ANTIVERT ) tablet 12.5 mg  12.5 mg Oral TID Patel, Pranav M, MD       pantoprazole  (PROTONIX ) EC tablet 40 mg  40 mg Oral Daily Opyd, Timothy S, MD   40 mg at 07/04/23 0831   polyethylene glycol (MIRALAX  / GLYCOLAX ) packet 17 g  17 g Oral Daily PRN Opyd, Timothy S, MD       sodium chloride  flush (NS) 0.9 % injection 3 mL  3 mL Intravenous Q12H Opyd, Timothy S, MD   3 mL at 07/04/23 0831     Discharge Medications: Please see discharge summary for a list of discharge medications.  Relevant Imaging Results:  Relevant Lab Results:   Additional Information SS# [id number]  Casey [name] Casey [name], [place]

## 2023-07-04 NOTE — Progress Notes (Signed)
 [facility] Progress Note Patient: Casey [name] ZOX:[id number] DOB: 1935-11-05 DOA: 07/02/2023  DOS: the patient was seen and examined on 07/04/2023  Brief Hospital Course: Patient with PMH of HTN, type II DM, CKD 3, dementia, compliance issues presented to the hospital with complaints of headache, dizziness, slurred speech and shaking. Neurology was consulted.  Stroke ruled out.  EEG performed which was also able to rule out a seizure. Likely medication side effect as a cause of her presentation. When patient woke up from her sleep, she began to feel dizzy with a 10/ 10 headache. Pt attempted to walk but fell due to her having a sudden lack of coordination. felt weak all over. she did not hit her head. EMS noted expressive aphasia as well as an ataxic gait.  Presented as code Stroke.  Neurology was consulted.  MRI brain negative for stroke.  Code stroke was canceled.  EEG was also performed which is negative for any active seizures.   Assessment and Plan: Acute ataxia. Symptoms currently appear to have resolved. There was concern for a stroke although CT and MRI negative for any acute stroke. Neurology was consulted.  No further workup recommended. PT OT consulted.  Recommend SNF. Patient currently refusing to go to SNF. Orthostatic vitals are normal. Will monitor.  Dizziness. Will consult PT for vestibular reevaluation.   Tremors. Patient reports shaking episode associated with headache. EEG was performed which is negative for any active seizure or seizure-like activity or epileptogenic foci. Neurology recommends no further workup or treatment. Monitor   Headache. Reports 10 out of 10 headache intermittently occurring starting from her back of her head and going to the forehead area. Would recommend as needed medications.   Hypokalemia. Replace.   Type 2 diabetes mellitus. Hemoglobin A1c was 5.5. Currently on sliding scale insulin .   HTN. On Norvasc .  As well as  Lasix .  Continue.   CKD 3. Renal function baseline. Monitor.   Anxiety. Patient is on Prozac  and Wellbutrin . There is BuSpar  prescription as well as gabapentin . Patient's brother helps her with the medication although suspect that polypharmacy is most likely cause of her presentation with dizziness. Would recommend to reduce the dose of the gabapentin  for now. Also discontinue BuSpar .   Chronic HFpEF. Chronically on Lasix  and potassium. Will reduce the dose of the Lasix . Monitor.   GERD. Continue PPI.   HLD. Continue statin.   Dementia. Patient is a very poor historian given her history of dementia. She is not able to keep the chronology intact on examination. Concern is that she may be suffering from polypharmacy and mistakenly taking medications that she already took or forgetting some medications. Discussed with brother, recommending to monitor her medication intake more closely.   Subjective: No nausea or vomiting.  No fever no chills.  Only complaint dizziness. Headache resolved.  Physical Exam: General: in Mild distress, No Rash Cardiovascular: S1 and S2 Present, No Murmur Respiratory: Good respiratory effort, Bilateral Air entry present. No Crackles, No wheezes Abdomen: Bowel Sound present, No tenderness Extremities: No edema Neuro: Alert and oriented x3, no new focal deficit  Data Reviewed: I have Reviewed nursing notes, Vitals, and Lab results. Since last encounter, pertinent lab results CBC and BMP   . I have ordered test including BMP  .   Disposition: Status is: Observation Place TED hose Start: 07/04/23 1334 enoxaparin  (LOVENOX ) injection 40 mg Start: 07/02/23 1000   Family Communication: Family at bedside Level of care: Med-Surg   Vitals:   07/04/23  0981 07/04/23 0756 07/04/23 0831 07/04/23 1539  BP: 139/63 137/70 137/70 136/63  Pulse: 65 76  67  Resp: 18 16  16   Temp: 98.2 F (36.8 C) 98.4 F (36.9 C)  98.3 F (36.8 C)  TempSrc: Oral Oral   Oral  SpO2: 95% 100%  100%     Author: Charlean Congress, MD 07/04/2023 7:35 PM  Please look on [email or web address] to find out who is on call.

## 2023-07-04 NOTE — Plan of Care (Signed)

## 2023-07-04 NOTE — Hospital Course (Signed)
 Patient with PMH of HTN, type II DM, CKD 3, dementia, compliance issues presented to the hospital with complaints of headache, dizziness, slurred speech and shaking. Neurology was consulted.  Stroke ruled out.  EEG performed which was also able to rule out a seizure. Likely medication side effect as a cause of her presentation. When patient woke up from her sleep, she began to feel dizzy with a 10/ 10 headache. Pt attempted to walk but fell due to her having a sudden lack of coordination. felt weak all over. she did not hit her head. EMS noted expressive aphasia as well as an ataxic gait.  Presented as code Stroke.  Neurology was consulted.  MRI brain negative for stroke.  Code stroke was canceled.  EEG was also performed which is negative for any active seizures.   Assessment and Plan: Acute ataxia. Symptoms currently appear to have resolved. There was concern for a stroke although CT and MRI negative for any acute stroke. Neurology was consulted.  No further workup recommended. PT OT consulted.  Recommend SNF. Patient currently refusing to go to SNF. Orthostatic vitals are normal. Will monitor.  Dizziness. Will consult PT for vestibular reevaluation.   Tremors. Patient reports shaking episode associated with headache. EEG was performed which is negative for any active seizure or seizure-like activity or epileptogenic foci. Neurology recommends no further workup or treatment. Monitor   Headache. Reports 10 out of 10 headache intermittently occurring starting from her back of her head and going to the forehead area. Would recommend as needed medications.   Hypokalemia. Replace.   Type 2 diabetes mellitus. Hemoglobin A1c was 5.5. Currently on sliding scale insulin .   HTN. On Norvasc .  As well as Lasix .  Continue.   CKD 3. Renal function baseline. Monitor.   Anxiety. Patient is on Prozac  and Wellbutrin . There is BuSpar  prescription as well as gabapentin . Patient's brother  helps her with the medication although suspect that polypharmacy is most likely cause of her presentation with dizziness. Would recommend to reduce the dose of the gabapentin  for now. Also discontinue BuSpar .   Chronic HFpEF. Chronically on Lasix  and potassium. Will reduce the dose of the Lasix . Monitor.   GERD. Continue PPI.   HLD. Continue statin.   Dementia. Patient is a very poor historian given her history of dementia. She is not able to keep the chronology intact on examination. Concern is that she may be suffering from polypharmacy and mistakenly taking medications that she already took or forgetting some medications. Discussed with brother, recommending to monitor her medication intake more closely.

## 2023-07-04 NOTE — TOC Progression Note (Signed)
 Transition of Care [facility]) - Progression Note    Patient Details  Name: Casey [name] MRN: [id number] Date of Birth: 1935/03/03  Transition [facility] Care [facility]) CM/SW Contact  Jannine Meo, RN Phone Number: 07/04/2023, 11:33 AM  Clinical Narrative:   Secure chat from floor nurse regarding son, Debarah Mccumbers [phone]) wanting update on plan of care at discharge. Per secure chat yesterday from OT, pt did agree to go to SNF. Spoke with patient by phone today, she confirmed that she is in agreement to going to SNF. Patient currently lives at home alone. CSW made aware. Patient gave consent to speak with Son, as he was not listed as one of the contacts for the patient.    Expected Discharge Plan: Home w Home Health Services Barriers to Discharge: Continued Medical Work up  Expected Discharge Plan and Services In-house Referral: Clinical Social Work Discharge Planning Services: CM Consult   Living arrangements for the past 2 months: Single Family Home                                       Social Determinants of Health (SDOH) Interventions SDOH Screenings   Food Insecurity: No Food Insecurity (07/03/2023)  Housing: Unknown (07/03/2023)  Transportation Needs: No Transportation Needs (07/03/2023)  Utilities: Not At Risk (07/03/2023)  Alcohol  Screen: Low Risk  (04/29/2023)  Depression (PHQ2-9): High Risk (06/29/2023)  Financial Resource Strain: Low Risk  (04/29/2023)  Physical Activity: Insufficiently Active (04/29/2023)  Social Connections: Moderately Isolated (07/03/2023)  Stress: Stress Concern Present (04/29/2023)  Tobacco Use: Low Risk  (07/02/2023)  Health Literacy: Adequate Health Literacy (04/29/2023)    Readmission Risk Interventions     No data to display

## 2023-07-04 NOTE — TOC Progression Note (Signed)
 Transition of Care [facility]) - Progression Note    Patient Details  Name: Casey [name] MRN: [id number] Date of Birth: 1935/05/01  Transition [facility] Care [facility]) CM/SW Contact  Briea Mcenery [place], [place] Phone Number: 07/04/2023, 2:17 PM  Clinical Narrative:    CSW met with patient at bedside and confirmed patient's decision for SNF. Patient confirmed having no facility preference. FL2 faxed out. Medicare star rating list provided to patient for review. PASRR initiated additional documents requested. Received message from RN to contact  patient's son Arnetta Lank, [phone] however voicemail was full.  Transition of Care to continue to follow.  Ashleigh Luckow, LCSW Transition of Care      Expected Discharge Plan: Home w Home Health Services Barriers to Discharge: Continued Medical Work up  Expected Discharge Plan and Services In-house Referral: Clinical Social Work Discharge Planning Services: CM Consult   Living arrangements for the past 2 months: Single Family Home                                       Social Determinants of Health (SDOH) Interventions SDOH Screenings   Food Insecurity: No Food Insecurity (07/03/2023)  Housing: Unknown (07/03/2023)  Transportation Needs: No Transportation Needs (07/03/2023)  Utilities: Not At Risk (07/03/2023)  Alcohol  Screen: Low Risk  (04/29/2023)  Depression (PHQ2-9): High Risk (06/29/2023)  Financial Resource Strain: Low Risk  (04/29/2023)  Physical Activity: Insufficiently Active (04/29/2023)  Social Connections: Moderately Isolated (07/03/2023)  Stress: Stress Concern Present (04/29/2023)  Tobacco Use: Low Risk  (07/02/2023)  Health Literacy: Adequate Health Literacy (04/29/2023)    Readmission Risk Interventions     No data to display

## 2023-07-05 DIAGNOSIS — R251 Tremor, unspecified: Secondary | ICD-10-CM | POA: Diagnosis not present

## 2023-07-05 LAB — BASIC METABOLIC PANEL WITH GFR
Anion gap: 11 (ref 5–15)
BUN: 16 mg/dL (ref 8–23)
CO2: 28 mmol/L (ref 22–32)
Calcium: 9.4 mg/dL (ref 8.9–10.3)
Chloride: 102 mmol/L (ref 98–111)
Creatinine, Ser: 1.16 mg/dL — ABNORMAL HIGH (ref 0.44–1.00)
GFR, Estimated: 46 mL/min — ABNORMAL LOW (ref >60)
Glucose, Bld: 164 mg/dL — ABNORMAL HIGH (ref 70–99)
Potassium: 4.5 mmol/L (ref 3.5–5.1)
Sodium: 141 mmol/L (ref 135–145)

## 2023-07-05 LAB — GLUCOSE, CAPILLARY
Glucose-Capillary: 311 mg/dL — ABNORMAL HIGH (ref 70–99)
Glucose-Capillary: 98 mg/dL (ref 70–99)
Glucose-Capillary: 190 mg/dL — ABNORMAL HIGH (ref 70–99)

## 2023-07-05 MED ORDER — GABAPENTIN 300 MG PO CAPS: 300.0000 mg | ORAL_CAPSULE | Freq: Every day | ORAL | 0 refills | Status: DC

## 2023-07-05 MED ORDER — FUROSEMIDE 20 MG PO TABS: 20.0000 mg | ORAL_TABLET | Freq: Every day | ORAL | 0 refills | Status: DC

## 2023-07-05 NOTE — TOC Progression Note (Signed)
 Transition of Care [facility]) - Progression Note    Patient Details  Name: Casey [name] MRN: [id number] Date of Birth: Jan 26, 1935  Transition of Care [facility]) CM/SW Contact  Katrinka Parr, [place] Phone Number: 07/05/2023, 3:11 PM  Clinical Narrative:     Per MD patient ready for DC to [facility] . RN, patient, patient's family, and facility notified of DC. Discharge Summary and FL2 sent to facility. RN to call report prior to discharge [phone]). DC packet on chart. Ambulance transport requested for patient.   CSW will sign off for now as social work intervention is no longer needed. Please consult us  again if new needs arise.   Expected Discharge Plan: Skilled Nursing Facility Barriers to Discharge: Insurance Authorization  Expected Discharge Plan and Services In-house Referral: Clinical Social Work Discharge Planning Services: CM Consult   Living arrangements for the past 2 months: Single Family Home Expected Discharge Date: 07/05/23                                     Social Determinants of Health (SDOH) Interventions SDOH Screenings   Food Insecurity: No Food Insecurity (07/03/2023)  Housing: Unknown (07/03/2023)  Transportation Needs: No Transportation Needs (07/03/2023)  Utilities: Not At Risk (07/03/2023)  Alcohol  Screen: Low Risk  (04/29/2023)  Depression (PHQ2-9): High Risk (06/29/2023)  Financial Resource Strain: Low Risk  (04/29/2023)  Physical Activity: Insufficiently Active (04/29/2023)  Social Connections: Moderately Isolated (07/03/2023)  Stress: Stress Concern Present (04/29/2023)  Tobacco Use: Low Risk  (07/02/2023)  Health Literacy: Adequate Health Literacy (04/29/2023)    Readmission Risk Interventions     No data to display

## 2023-07-05 NOTE — Plan of Care (Signed)
  Problem: Education: Goal: Ability to describe self-care measures that may prevent or decrease complications (Diabetes Survival Skills Education) will improve Outcome: Progressing   Problem: Coping: Goal: Ability to adjust to condition or change in health will improve Outcome: Progressing   Problem: Skin Integrity: Goal: Risk for impaired skin integrity will decrease Outcome: Progressing   Problem: Education: Goal: Knowledge of General Education information will improve Description: Including pain rating scale, medication(s)/side effects and non-pharmacologic comfort measures Outcome: Progressing

## 2023-07-05 NOTE — Progress Notes (Signed)
 Physical Therapy Treatment Patient Details Name: Casey [name] MRN: [id number] DOB: 1935/04/12 Today's Date: 07/05/2023   History of Present Illness Patient is an 88 y/o female admitted 07/02/23 due to shaking, dizziness, headache and speech changes.  CTH and MRI negative for acute changes, though admitted with hypokalemia and for EEG to r/o seizure.    PMH includes HTN, HLD, DM, BLE edema, vertigo, dementia with behavioral disturbance.    PT Comments  Patient progressing to hallway ambulation and working on endurance though reports fatigued during ambulation after prolonged immobility.  She has difficulty with commands for vestibular testing and has drooping eyelids so difficulty to see.  Positive for symptoms with VOR testing though able to maintain target and no issues with head thrust test.  Feel she may have uncompensated vestibular hypofunction and may benefit from vestibular adaptation exercises.  Currently teaching compensation with visual fixation.  Patient remains appropriate for inpatient rehab (<3 hours/day) at d/c.   Vestibular Assessment - 07/05/23 0001       Symptom Behavior   Subjective history of current problem Reports symptoms of imbalance going on for months reporting only ione fall trying to get to the door to let in EMS, though now using walker more and with headache associated with dizziness.    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      Auditory   Comments intact to scratch test and equal bilateral            If plan is discharge home, recommend the following: A little help with walking and/or transfers;A little help with bathing/dressing/bathroom;Help with stairs or ramp for entrance;Assistance with cooking/housework;Assist for transportation   Can travel by private vehicle     Yes  Equipment Recommendations       Recommendations for Other Services       Precautions / Restrictions Precautions Precautions: Fall Recall of Precautions/Restrictions: Intact     Mobility  Bed Mobility               General bed mobility comments: in recliner    Transfers Overall transfer level: Needs assistance Equipment used: Rolling walker (2 wheels) Transfers: Sit to/from Stand Sit to Stand: Contact guard assist           General transfer comment: cues for hand placement from recliner and to toilet with grabbar with CGA for safety and increased time    Ambulation/Gait Ambulation/Gait assistance: Contact guard assist, Min assist Gait Distance (Feet): 120 Feet (& 10'x 2) Assistive device: Rolling walker (2 wheels) Gait Pattern/deviations: Step-through pattern, Step-to pattern, Decreased stride length, Wide base of support  General Gait Details: cues throughout for visual compensatory techniques due to dizziness (imbalance)   Stairs             Wheelchair Mobility     Tilt Bed    Modified Rankin (Stroke Patients Only)       Balance Overall balance assessment: Needs assistance Sitting-balance support: Feet supported Sitting balance-Leahy Scale: Good     Standing balance support: Bilateral upper extremity supported Standing balance-Leahy Scale: Poor Standing  balance comment: reliant on RW                            Communication Communication Communication: No apparent difficulties  Cognition Arousal: Alert Behavior During Therapy: WFL for tasks assessed/performed   PT - Cognitive impairments: History of cognitive impairments                         Following commands: Intact      Cueing Cueing Techniques: Verbal cues  Exercises      General Comments General comments (skin integrity, edema, etc.): seated vestibular exam though limited by difficulty seeing her eyes with tinted glasses and blepharaptosis; toileted completing toilet hygiene unaided      Pertinent Vitals/Pain Pain Assessment Pain Assessment: Faces Faces Pain Scale: Hurts little more Pain Location: headache Pain Descriptors / Indicators: Constant Pain Intervention(s): Monitored during session    Home Living                          Prior Function            PT Goals (current goals can now be found in the care plan section) Progress towards PT goals: Progressing toward goals    Frequency    Min 2X/week      PT Plan      Co-evaluation              AM-PAC PT 6 Clicks Mobility   Outcome Measure  Help needed turning from your back to your side while in a flat bed without using bedrails?: A Little Help needed moving from lying on your back to sitting on the side of a flat bed without using bedrails?: A Little Help needed moving to and from a bed to a chair (including a wheelchair)?: A Little Help needed standing up from a chair using your arms (e.g., wheelchair or bedside chair)?: A Little Help needed to walk in hospital room?: A Little Help needed climbing 3-5 steps with a railing? : Total 6 Click Score: 16    End of Session Equipment Utilized During Treatment: Gait belt Activity Tolerance: Patient tolerated treatment well Patient left: in chair;with call bell/phone within reach   PT Visit Diagnosis: Other  abnormalities of gait and mobility (R26.89);Other symptoms and signs involving the nervous system (R29.898);Dizziness and giddiness (R42)     Time: 1221-1300 PT Time Calculation (min) (ACUTE ONLY): 39 min  Charges:    $Gait Training: 8-22 mins $Therapeutic Activity: 8-22 mins $Neuromuscular Re-education: 8-22 mins PT General Charges $$ ACUTE PT VISIT: 1 Visit                     Abigail Hoff, PT Acute Rehabilitation Services [phone] 07/05/2023    Casey [name] 07/05/2023, 1:42 PM

## 2023-07-05 NOTE — Discharge Summary (Signed)
 Physician Discharge Summary   Patient: Casey [name] MRN: [id number] DOB: 24-Jan-1935  Admit date:     07/02/2023  Discharge date: 07/05/23  Discharge Physician: Charlean Congress  PCP: Rodney Clamp, MD  Recommendations at discharge: Follow-up with PCP.   Follow-up Information     Rodney Clamp, MD. Schedule an appointment as soon as possible for a visit in 1 week(s).   Specialty: Family Medicine Contact information: [address] [place] [address] [phone]                Discharge Diagnoses: Principal Problem:   Episode of shaking Active Problems:   GAD (generalized anxiety disorder)   Type 2 diabetes mellitus with microalbuminuria, without long-term current use of insulin  (HCC)   Dementia without behavioral disturbance (HCC)   Depression, major, single episode, moderate (HCC)   Hypokalemia   CKD stage 3a, GFR 45-59 ml/min [facility])  Hospital Course: Patient with PMH of HTN, type II DM, CKD 3, dementia, compliance issues presented to the hospital with complaints of headache, dizziness, slurred speech and shaking. Neurology was consulted.  Stroke ruled out.  EEG performed which was also able to rule out a seizure. Likely medication side effect as a cause of her presentation. When patient woke up from her sleep, she began to feel dizzy with a 10/ 10 headache. Pt attempted to walk but fell due to her having a sudden lack of coordination. felt weak all over. she did not hit her head. EMS noted expressive aphasia as well as an ataxic gait.  Presented as code Stroke.  Neurology was consulted.  MRI brain negative for stroke.  Code stroke was canceled.  EEG was also performed which is negative for any active seizures.  Assessment and Plan: Acute ataxia. Vestibular hypofunction Symptoms intermittent. There was concern for a stroke although CT head and MRI brain negative for any acute stroke. Neurology was consulted.  No further workup recommended. PT OT consulted.   Recommend SNF. Patient currently refusing to go to SNF. Orthostatic vitals are normal. Vestibular rehab was recommended on discharge. Patient has seen ENT in the past.  Would recommend to follow-up with ENT again.  Dizziness. Will consult PT for vestibular reevaluation.   Tremors/tics Patient reports shaking episode associated with headache. EEG was performed which is negative for any active seizure or seizure-like activity or epileptogenic foci. No tremors are noted on exam but appears to have some facial tics. Neurology recommends no further workup or treatment. Outpatient follow-up recommended.   Headache. Reports 10 out of 10 headache intermittently occurring starting from her back of her head and going to the forehead area. Would recommend as needed medications.   Hypokalemia. Replace.   Type 2 diabetes mellitus.  Well-controlled without long-term insulin  use with neuropathy Hemoglobin A1c was 5.5. Currently on sliding scale insulin .   HTN. On Norvasc .  As well as Lasix .  Continue.   CKD 3. Renal function baseline. Monitor.   Anxiety. Patient is on Prozac  and Wellbutrin . There is BuSpar  prescription as well as gabapentin . Patient's brother helps her with the medication although suspect that polypharmacy is most likely cause of her presentation with dizziness. Would recommend to reduce the dose of the gabapentin  for now. Also discontinue BuSpar .   Chronic HFpEF. Chronically on Lasix  and potassium. Will reduce the dose of the Lasix . Monitor.   GERD. Continue PPI.   HLD. Continue statin.   Dementia. Patient is a very poor historian given her history of dementia. She is not  able to keep the chronology intact on examination. Concern is that she may be suffering from polypharmacy and mistakenly taking medications that she already took or forgetting some medications. Discussed with brother, recommending to monitor her medication intake more  closely.  Consultants:  Neurology  Procedures performed:  EEG  DISCHARGE MEDICATION: Allergies as of 07/05/2023   No Known Allergies      Medication List     STOP taking these medications    busPIRone  10 MG tablet Commonly known as: BUSPAR    potassium chloride  SA 20 MEQ tablet Commonly known as: KLOR-CON  M       TAKE these medications    acetaminophen  500 MG tablet Commonly known as: TYLENOL  Take 1,000 mg by mouth every 6 (six) hours as needed.   amLODipine  2.5 MG tablet Commonly known as: NORVASC  Take 2 tablets (5 mg total) by mouth daily.   atorvastatin  10 MG tablet Commonly known as: LIPITOR Take 1 tablet (10 mg total) by mouth daily.   azelastine  0.05 % ophthalmic solution Commonly known as: OPTIVAR  Place 2 drops into both eyes 2 (two) times daily.   azelastine  0.1 % nasal spray Commonly known as: ASTELIN  INSTILL 1 SPRAY INTO BOTH NOSTRILS TWICE DAILY   buPROPion  150 MG 24 hr tablet Commonly known as: Wellbutrin  XL Take 1 tablet (150 mg total) by mouth daily.   CertaVite/Antioxidants Tabs Take 1 tablet by mouth at bedtime.   donepezil  10 MG tablet Commonly known as: ARICEPT  Take 1 tablet (10 mg total) by mouth at bedtime.   FLUoxetine  20 MG tablet Commonly known as: PROZAC  Take 1 tablet (20 mg total) by mouth daily.   fluticasone  50 MCG/ACT nasal spray Commonly known as: FLONASE  Place 1 spray into both nostrils daily for 7 days. What changed:  how much to take when to take this   furosemide  20 MG tablet Commonly known as: LASIX  Take 1 tablet (20 mg total) by mouth daily. Start taking on: July 06, 2023 What changed:  medication strength how much to take   gabapentin  300 MG capsule Commonly known as: NEURONTIN  Take 1 capsule (300 mg total) by mouth at bedtime. What changed: See the new instructions.   Jardiance  25 MG Tabs tablet Generic drug: empagliflozin  TAKE 1 TABLET(25 MG) BY MOUTH DAILY BEFORE BREAKFAST   pantoprazole  40  MG tablet Commonly known as: PROTONIX  Take 1 tablet (40 mg total) by mouth daily.   polyethylene glycol powder 17 GM/SCOOP powder Commonly known as: GLYCOLAX /MIRALAX  Take 17 g by mouth daily. What changed:  when to take this reasons to take this   Refresh Optive 1-0.9 % Gel Generic drug: Carboxymethylcellul-Glycerin  Place 1 drop into both eyes daily.   Senexon-S 8.6-50 MG tablet Generic drug: senna-docusate Take 1 tablet by mouth at bedtime as needed for mild constipation.   sitaGLIPtin  100 MG tablet Commonly known as: Januvia  Take 1 tablet (100 mg total) by mouth daily.       Disposition: SNF Diet recommendation: Cardiac diet  Discharge Exam: Vitals:   07/04/23 1539 07/04/23 2011 07/05/23 0430 07/05/23 0813  BP: 136/63 (!) 145/67 135/76 (!) 153/71  Pulse: 67 71 67 76  Resp: 16 18 17 19   Temp: 98.3 F (36.8 C) 98.1 F (36.7 C) 98.1 F (36.7 C) 98.6 F (37 C)  TempSrc: Oral Oral Oral Oral  SpO2: 100% 99% 97% 98%   General: Appear in mild distress; no visible Abnormal Neck Mass Or lumps, Conjunctiva normal Cardiovascular: S1 and S2 Present, no Murmur, Respiratory: good  respiratory effort, Bilateral Air entry present and CTA, no Crackles, no wheezes Abdomen: Bowel Sound present, Non tender  Extremities: no Pedal edema Neurology: alert and oriented to time, place, and person   Condition at discharge: stable  The results of significant diagnostics from this hospitalization (including imaging, microbiology, ancillary and laboratory) are listed below for reference.   Imaging Studies: EEG adult Result Date: 07/02/2023 Arleene Lack, MD     07/02/2023  7:21 PM Patient Name: TISHIE ALTMANN MRN: [id number] Epilepsy Attending: Arleene Lack Referring Physician/Provider: Walton Guppy, MD Date: 07/02/2023 Duration: 25.12 mins Patient history: 88 y.o. female with significant history of anxiety and depression, hypertension hyperlipidemia noncompliance to medications  presenting for evaluation of sudden onset of dizziness, and on EMS evaluation, difficulty with word finding which has since resolved.  EEG to evaluate for seizure Level of alertness: Awake AEDs during EEG study: GBP Technical aspects: This EEG study was done with scalp electrodes positioned according to the 10-20 International system of electrode placement. Electrical activity was reviewed with band pass filter of 1-70Hz , sensitivity of 7 uV/mm, display speed of 20mm/sec with a 60Hz  notched filter applied as appropriate. EEG data were recorded continuously and digitally stored.  Video monitoring was available and reviewed as appropriate. Description: The posterior dominant rhythm consists of 9 Hz activity of moderate voltage (25-35 uV) seen predominantly in posterior head regions, symmetric and reactive to eye opening and eye closing. Hyperventilation and photic stimulation were not performed.   There was significant eye flutter artifact during the study. IMPRESSION: This study is within normal limits. No seizures or epileptiform discharges were seen throughout the recording. A normal interictal EEG does not exclude the diagnosis of epilepsy. ANALIZ TVEDT   MR BRAIN WO CONTRAST Result Date: 07/02/2023 EXAM: MRI BRAIN WITHOUT CONTRAST 07/02/2023 04:06:20 AM TECHNIQUE: Multiplanar multisequence MRI of the head/brain was performed without the administration of intravenous contrast. COMPARISON: CT head without contrast 07/02/2023 and MRI head without contrast 01/26/2023. CLINICAL HISTORY: Neuro deficit, acute, stroke suspected. FINDINGS: BRAIN AND VENTRICLES: No acute infarct. No intracranial hemorrhage. No mass. No midline shift. No hydrocephalus. The sella is unremarkable. Normal flow voids. Moderate generalized atrophy is most evident in the high parietal lobes bilaterally, stable. ORBITS: No acute abnormality. SINUSES AND MASTOIDS: No acute abnormality. BONES AND SOFT TISSUES: Normal marrow signal. No acute  soft tissue abnormality. IMPRESSION: 1. No acute intracranial abnormality. 2. Moderate generalized atrophy, most evident in the high parietal lobes bilaterally, stable. Electronically signed by: Audree Leas MD 07/02/2023 04:18 AM EDT RP Workstation: [id number]   CT HEAD CODE STROKE WO CONTRAST Result Date: 07/02/2023 CLINICAL DATA:  Code stroke. Initial evaluation for acute neuro deficit, stroke suspected. EXAM: CT HEAD WITHOUT CONTRAST TECHNIQUE: Contiguous axial images were obtained from the base of the skull through the vertex without intravenous contrast. RADIATION DOSE REDUCTION: This exam was performed according to the departmental dose-optimization program which includes automated exposure control, adjustment of the mA and/or kV according to patient size and/or use of iterative reconstruction technique. COMPARISON:  Comparison made with prior studies from 01/26/2023 FINDINGS: Brain: Mild age-related cerebral atrophy with chronic small vessel ischemic disease. No acute intracranial hemorrhage. No acute large vessel territory infarct. No mass lesion or midline shift. Ventricles normal size without hydrocephalus. No extra-axial fluid collection. Vascular: No abnormal hyperdense vessel. Scattered vascular calcifications noted within the carotid siphons. Skull: No acute scalp soft tissue abnormality.  Calvarium intact. Sinuses/Orbits: Globes orbital soft tissues within normal limits. Mild mucosal  thickening present about the ethmoidal air cells and maxillary sinuses. Paranasal sinuses are otherwise clear. No mastoid effusion. Other: None. ASPECTS [facility] Stroke Program Early CT Score) - Ganglionic level infarction (caudate, lentiform nuclei, internal capsule, insula, M1-M3 cortex): 7 - Supraganglionic infarction (M4-M6 cortex): 3 Total score (0-10 with 10 being normal): 10 IMPRESSION: 1. No acute intracranial abnormality. 2. ASPECTS is 10. 3. Mild age-related cerebral atrophy with chronic small vessel  ischemic disease. These results were communicated to Dr. Arora at 3:32 am on 07/02/2023 by text page via the [facility] messaging system. Electronically Signed   By: Virgia Griffins M.D.   On: 07/02/2023 03:34    Microbiology: Results for orders placed or performed during the hospital encounter of 02/19/23  Resp panel by RT-PCR (RSV, Flu A&B, Covid) Anterior Nasal Swab     Status: None   Collection Time: 02/19/23  6:24 PM   Specimen: Anterior Nasal Swab  Result Value Ref Range Status   SARS Coronavirus 2 by RT PCR NEGATIVE NEGATIVE Final   Influenza A by PCR NEGATIVE NEGATIVE Final   Influenza B by PCR NEGATIVE NEGATIVE Final    Comment: (NOTE) The Xpert Xpress SARS-CoV-2/FLU/RSV plus assay is intended as an aid in the diagnosis of influenza from Nasopharyngeal swab specimens and should not be used as a sole basis for treatment. Nasal washings and aspirates are unacceptable for Xpert Xpress SARS-CoV-2/FLU/RSV testing.  Fact Sheet for Patients: BloggerCourse.com  Fact Sheet for Healthcare Providers: SeriousBroker.it  This test is not yet approved or cleared by the [place]  FDA and has been authorized for detection and/or diagnosis of SARS-CoV-2 by FDA under an Emergency Use Authorization (EUA). This EUA will remain in effect (meaning this test can be used) for the duration of the COVID-19 declaration under Section 564(b)(1) of the Act, 21 U.S.C. section 360bbb-3(b)(1), unless the authorization is terminated or revoked.     Resp Syncytial Virus by PCR NEGATIVE NEGATIVE Final    Comment: (NOTE) Fact Sheet for Patients: BloggerCourse.com  Fact Sheet for Healthcare Providers: SeriousBroker.it  This test is not yet approved or cleared by the [place]  FDA and has been authorized for detection and/or diagnosis of SARS-CoV-2 by FDA under an Emergency Use Authorization (EUA).  This EUA will remain in effect (meaning this test can be used) for the duration of the COVID-19 declaration under Section 564(b)(1) of the Act, 21 U.S.C. section 360bbb-3(b)(1), unless the authorization is terminated or revoked.  Performed at [facility], [address], [place], [place] [address]    Labs: CBC: Recent Labs  Lab 07/02/23 0319 07/02/23 0320 07/03/23 0755  WBC 8.3  --  7.1  NEUTROABS 3.6  --   --   HGB 12.1 12.9 12.5  HCT 36.9 38.0 38.5  MCV 87.2  --  86.5  PLT 253  --  299   Basic Metabolic Panel: Recent Labs  Lab 07/02/23 0319 07/02/23 0320 07/03/23 0755 07/04/23 0920 07/05/23 0551  NA 138 139 139 136 141  K 3.0* 2.9* 4.2 4.6 4.5  CL 101 100 102 100 102  CO2 24  --  27 26 28   GLUCOSE 244* 244* 295* 313* 164*  BUN 7* 7* 7* 15 16  CREATININE 1.04* 1.00 0.97 0.95 1.16*  CALCIUM  9.3  --  9.8 9.7 9.4  MG  --   --  1.8  --   --    Liver Function Tests: Recent Labs  Lab 07/02/23 0319  AST 19  ALT 13  ALKPHOS 92  BILITOT 0.6  PROT 6.8  ALBUMIN 3.8   CBG: Recent Labs  Lab 07/04/23 1135 07/04/23 1754 07/04/23 2013 07/05/23 0846 07/05/23 1154  GLUCAP 138* 281* 124* 311* 98    Discharge time spent: greater than 30 minutes.  Author: Charlean Congress, MD  [facility]

## 2023-07-05 NOTE — Progress Notes (Signed)
 Per pt, she had meds in main pharmacy. Called main pharmacy. After checking around they stated they did not have any home meds for this patient.

## 2023-07-05 NOTE — TOC Progression Note (Addendum)
 Transition of Care [facility]) - Progression Note    Patient Details  Name: Casey [name] MRN: [id number] Date of Birth: 1935/07/07  Transition of Care [facility] CM/SW Contact  Katrinka Parr, [place] Phone Number: 07/05/2023, 10:13 AM  Clinical Narrative:     CSW met with pt and provided SNF bed offers and medicare star ratings. She chooses [facility]. CSW confirmed bed with [facility].   CSW initiated SNF auth request. Auth# [id number] SNF auth is pending at this time.   1430: Auth approved  6/16- 6/18 [id number]  PASRR also approved [id number] E, expires 08/04/23    Expected Discharge Plan: Skilled Nursing Facility Barriers to Discharge: Insurance Authorization  Expected Discharge Plan and Services In-house Referral: Clinical Social Work Discharge Planning Services: CM Consult   Living arrangements for the past 2 months: Single Family Home                                       Social Determinants of Health (SDOH) Interventions SDOH Screenings   Food Insecurity: No Food Insecurity (07/03/2023)  Housing: Unknown (07/03/2023)  Transportation Needs: No Transportation Needs (07/03/2023)  Utilities: Not At Risk (07/03/2023)  Alcohol  Screen: Low Risk  (04/29/2023)  Depression (PHQ2-9): High Risk (06/29/2023)  Financial Resource Strain: Low Risk  (04/29/2023)  Physical Activity: Insufficiently Active (04/29/2023)  Social Connections: Moderately Isolated (07/03/2023)  Stress: Stress Concern Present (04/29/2023)  Tobacco Use: Low Risk  (07/02/2023)  Health Literacy: Adequate Health Literacy (04/29/2023)    Readmission Risk Interventions     No data to display

## 2023-07-05 NOTE — Progress Notes (Signed)
       RE:   Casey [name]  Date of Birth:  Dec 19, 1935  Date:   07/05/2023    To Whom It May Concern:  Please be advised that the above-named patient will require a short-term nursing home stay - anticipated 30 days or less for rehabilitation and strengthening.  The plan is for return home.

## 2023-07-06 ENCOUNTER — Telehealth: Payer: Self-pay | Admitting: Licensed Clinical Social Worker

## 2023-07-06 ENCOUNTER — Other Ambulatory Visit: Payer: Self-pay | Admitting: Pharmacist

## 2023-07-06 ENCOUNTER — Telehealth: Payer: Self-pay | Admitting: Pharmacist

## 2023-07-06 NOTE — Telephone Encounter (Signed)
 Tried to contact patient twice - no answer. I did reacher her niece, Carey Chapman. She states Mrs. Estock did agree to rehab after her recent hospitalization and she is currently in rehab facility.  Will have scheduler reach out to patient in 1 to 2 weeks to reschedules appt with Clinical Pharmacist. (If patient is able to come in and bring all her medications that would be most helpful)

## 2023-07-06 NOTE — Telephone Encounter (Signed)
 Attempt was made to contact patient by phone today for follow up by Clinical Pharmacist regarding medication management.  Unable to reach patient. Unable to LM on VM.

## 2023-07-07 ENCOUNTER — Telehealth: Payer: Self-pay | Admitting: Family Medicine

## 2023-07-07 NOTE — Telephone Encounter (Unsigned)
 Copied from CRM [id number]. Topic: Clinical - Medication Refill >> Jul 07, 2023  4:15 PM Trula Gable C wrote: Medication: HYDROcodone -acetaminophen  (NORCO/VICODIN) 5-325 MG tablet  Has the patient contacted their pharmacy? Yes (Agent: If no, request that the patient contact the pharmacy for the refill. If patient does not wish to contact the pharmacy document the reason why and proceed with request.) (Agent: If yes, when and what did the pharmacy advise?)  This is the patient's preferred pharmacy:  [id number] - [place], [place] - [address] AT [address] [place] [address] Phone: [phone] Fax: [phone]  [facility] [address] [place] - [address]  [place] [address] Phone: [phone] Fax: [phone]  Is this the correct pharmacy for this prescription? Yes If no, delete pharmacy and type the correct one.   Has the prescription been filled recently? No  Is the patient out of the medication? Yes  Has the patient been seen for an appointment in the last year OR does the patient have an upcoming appointment? Yes  Can we respond through MyChart? Yes  Agent: Please be advised that Rx refills may take up to 3 business days. We ask that you follow-up with your pharmacy.

## 2023-07-07 NOTE — Telephone Encounter (Signed)
 Routing encounter pt request medication not listed on current medication list: see below   Copied from CRM [id number]. Topic: Clinical - Medication Refill >> Jul 07, 2023  4:15 PM Trula Gable C wrote: Medication: HYDROcodone -acetaminophen  (NORCO/VICODIN) 5-325 MG tablet   Has the patient contacted their pharmacy? Yes (Agent: If no, request that the patient contact the pharmacy for the refill. If patient does not wish to contact the pharmacy document the reason why and proceed with request.) (Agent: If yes, when and what did the pharmacy advise?)   This is the patient's preferred pharmacy:  [facility] #[id number] - [place], [place] - [address] [place] [address] Phone: [phone] Fax: [phone]   [facility] [address] [place] - [address] [place] [address] Phone: [phone] Fax: [phone]   Is this the correct pharmacy for this prescription? Yes If no, delete pharmacy and type the correct one.    Has the prescription been filled recently? No   Is the patient out of the medication? Yes   Has the patient been seen for an appointment in the last year OR does the patient have an upcoming appointment? Yes   Can we respond through MyChart? Yes   Agent: Please be advised that Rx refills may take up to 3 business days. We ask that you follow-up with your pharmacy.

## 2023-07-08 ENCOUNTER — Other Ambulatory Visit: Payer: Self-pay | Admitting: *Deleted

## 2023-07-08 NOTE — Telephone Encounter (Signed)
 unable to contact patient no answer  Rx requested never prescribed  by Dr Daneil Dunker

## 2023-07-09 ENCOUNTER — Other Ambulatory Visit: Payer: Self-pay

## 2023-07-19 ENCOUNTER — Inpatient Hospital Stay: Admitting: Family Medicine

## 2023-07-22 ENCOUNTER — Encounter: Payer: Self-pay | Admitting: Family Medicine

## 2023-07-22 ENCOUNTER — Ambulatory Visit (INDEPENDENT_AMBULATORY_CARE_PROVIDER_SITE_OTHER): Admitting: Family Medicine

## 2023-07-22 VITALS — BP 128/70 | HR 80 | Temp 98.0°F | Ht 62.0 in | Wt 179.6 lb

## 2023-07-22 DIAGNOSIS — Z91148 Patient's other noncompliance with medication regimen for other reason: Secondary | ICD-10-CM | POA: Diagnosis not present

## 2023-07-22 DIAGNOSIS — F411 Generalized anxiety disorder: Secondary | ICD-10-CM | POA: Diagnosis not present

## 2023-07-22 DIAGNOSIS — E1159 Type 2 diabetes mellitus with other circulatory complications: Secondary | ICD-10-CM

## 2023-07-22 DIAGNOSIS — H8110 Benign paroxysmal vertigo, unspecified ear: Secondary | ICD-10-CM | POA: Diagnosis not present

## 2023-07-22 DIAGNOSIS — I152 Hypertension secondary to endocrine disorders: Secondary | ICD-10-CM

## 2023-07-22 MED ORDER — SITAGLIPTIN PHOSPHATE 100 MG PO TABS: 100.0000 mg | ORAL_TABLET | Freq: Every day | ORAL | 3 refills | Status: AC

## 2023-07-22 MED ORDER — EMPAGLIFLOZIN 25 MG PO TABS: 25.0000 mg | ORAL_TABLET | Freq: Every day | ORAL | 1 refills | Status: DC

## 2023-07-22 NOTE — Assessment & Plan Note (Signed)
 Still a significant and ongoing issue.  We referred her to chronic care management however her appointment was missed due to her recent hospitalization.  They should be reaching out to her soon about rescheduling this.

## 2023-07-22 NOTE — Progress Notes (Signed)
 Chief Complaint:  Casey [name] is a 88 y.o. female who presents today for a TCM visit.  Assessment/Plan:  Chronic Problems Addressed Today: BENIGN POSITIONAL VERTIGO Had extensive neurologic workup during her hospitalization which was negative for neurologic causes.  Still having quite a bit of issue with positional vertigo.  We had referred her to vestibular rehab in the past however she declined now she is now agreeable to this.  Will place referral today.  May consider having her follow back up with ENT depending on response to vestibular rehab.  Hypertension associated with diabetes (HCC) At goal today on amlodipine  5 mg daily.  GAD (generalized anxiety disorder) Symptoms are not fully controlled.  We did discuss having her follow back up with psychiatry however she has declined.  She is currently on gabapentin  300 mg nightly.  BuSpar  was discontinued during her recent hospitalization and she did not find that was effective.  She did ask about starting alprazolam  however recommended against this due to concern for side effects.  She will let us  know if she would like to be referred to psychiatry.  We have tried several other medications in the past without much benefit and she does not wish to start any other additional medications at this point.  Poor compliance with medication Still a significant and ongoing issue.  We referred her to chronic care management however her appointment was missed due to her recent hospitalization.  They should be reaching out to her soon about rescheduling this.     Subjective:  HPI:  Summary of Hospital admission: Reason for admission: Stroke Like symptoms Date of admission: 07/02/2023 Date of discharge: 07/05/2023 Date of Interactive contact: N/A Summary of Hospital course: Patient presented to the hospital on 07/02/2023 with headache, dizziness, slurred speech, and shaking.  EMS was called and she presented to the ED as code stroke.  Neurology  was consulted.  MRI was negative.  She was admitted for further management.  EEG was performed during hospitalization which was negative as well.  She had PT and OT evaluation during hospitalization who recommended SNF.  She was discharged in stable condition.  Interim history:  Patient's primary concern today is dizziness.  States that this has not changed much since her hospitalization.  She does state that she was in rehab for about a week and a half.  She does work with physical therapy while there however they only worked on her legs.  Does not sound like she had any vestibular rehab performed.  Patient reports that her symptoms did not improve and the sent her home.    Since being home, she is still having ongoing issues with daily dizziness.  Feels like an off-balance sensation like she is drunk.  No falls.  No syncopal episodes.  She has not had any change in symptoms since hospitalization.  States that her dizziness has been an ongoing issue for many years.  ROS: Per HPI, otherwise a complete review of systems was negative.   PMH:  The following were reviewed and entered/updated in epic: Past Medical History:  Diagnosis Date   ALLERGIC RHINITIS    Allergy    ANXIETY    ARM PAIN, RIGHT    BENIGN POSITIONAL VERTIGO    Carcinoma in situ of colon    CONSTIPATION    CYSTOCELE WITH INCOMPLETE UTERINE PROLAPSE    Diabetes mellitus without complication (HCC)    ESOPHAGEAL STRICTURE    FATIGUE    FLANK PAIN, LEFT  Headache(784.0)    HIP PAIN, LEFT    HYPERCHOLESTEROLEMIA    HYPERGLYCEMIA    HYPERTENSION, BENIGN ESSENTIAL 03/03/2007   LEG EDEMA, BILATERAL    Microscopic hematuria    OBESITY    OSTEOARTHRITIS, KNEE    OSTEOPOROSIS    Peripheral neuropathy, idiopathic    Problems with swallowing and mastication    SINUSITIS, RECURRENT    SPRAIN&STRAIN OTH SPEC SITES SHOULDER&UPPER ARM    Patient Active Problem List   Diagnosis Date Noted   CKD stage 3a, GFR 45-59 ml/min  (HCC) 07/02/2023   Leg edema 08/04/2022   Poor compliance with medication 06/11/2022   Chronic painful diabetic neuropathy (HCC) 06/11/2022   Debility 04/29/2022   Renal cyst, Bilateral 02/12/2022   Chronic pain 12/26/2021   Depression, major, single episode, moderate (HCC) 09/26/2021   Insomnia 09/26/2021   Dementia without behavioral disturbance (HCC) 01/03/2020   PUD (peptic ulcer disease) 07/17/2019   Vitamin B12 deficiency 07/14/2017   Osteoarthritis 07/07/2013   Type 2 diabetes mellitus with microalbuminuria, without long-term current use of insulin  (HCC) 05/26/2012   Long Q-T syndrome 11/26/2010   CYSTOCELE WITH INCOMPLETE UTERINE PROLAPSE 03/10/2010   MICROSCOPIC HEMATURIA 07/29/2009   Sinusitis, chronic 02/23/2008   BENIGN POSITIONAL VERTIGO 04/25/2007   Dyslipidemia associated with type 2 diabetes mellitus (HCC) 03/03/2007   GAD (generalized anxiety disorder) 03/03/2007   Hypertension associated with diabetes (HCC) 03/03/2007   Allergic rhinitis 03/03/2007   Osteoporosis 03/03/2007   Past Surgical History:  Procedure Laterality Date   BIOPSY  02/06/2022   Procedure: BIOPSY;  Surgeon: Elicia Claw, MD;  Location: [facility];  Service: Gastroenterology;;   COLONOSCOPY     ESOPHAGOGASTRODUODENOSCOPY (EGD) WITH PROPOFOL  N/A 02/06/2022   Procedure: ESOPHAGOGASTRODUODENOSCOPY (EGD) WITH PROPOFOL ;  Surgeon: Elicia Claw, MD;  Location: [facility];  Service: Gastroenterology;  Laterality: N/A;   KNEE ARTHROPLASTY Right 07/24/2020   Procedure: COMPUTER ASSISTED TOTAL KNEE ARTHROPLASTY;  Surgeon: Fidel Rogue, MD;  Location: [facility];  Service: Orthopedics;  Laterality: Right;   LEFT HEART CATHETERIZATION WITH CORONARY ANGIOGRAM N/A 12/02/2010   Procedure: LEFT HEART CATHETERIZATION WITH CORONARY ANGIOGRAM;  Surgeon: Peter M [place], MD;  Location: [facility] CATH LAB;  Service: Cardiovascular;:: NORMAL CORONARY ARTERIES - NORMAL LV FUNCTION & EDP.     TRANSTHORACIC  ECHOCARDIOGRAM  11/2010   Normal EF 60-65%. No RWMA.  Mild LAD dilation c/w Gr 1 DD.  -- PRETTY NORMAL ECHO    Family History  Problem Relation Age of Onset   Coronary artery disease Mother 21   Cancer Father 34   Heart disease Father    Colon cancer Other    Diabetes Other    Coronary artery disease Sister    Diabetes type II Sister    Cancer Sister    Coronary artery disease Brother    Kidney disease Brother    Coronary artery disease Brother    Breast cancer Neg Hx     Medications- Reconciled discharge and current medications in Epic.  Current Outpatient Medications  Medication Sig Dispense Refill   acetaminophen  (TYLENOL ) 500 MG tablet Take 1,000 mg by mouth every 6 (six) hours as needed.     amLODipine  (NORVASC ) 2.5 MG tablet Take 2 tablets (5 mg total) by mouth daily. 180 tablet 3   atorvastatin  (LIPITOR) 10 MG tablet Take 1 tablet (10 mg total) by mouth daily. 90 tablet 3   azelastine  (ASTELIN ) 0.1 % nasal spray INSTILL 1 SPRAY INTO BOTH NOSTRILS TWICE DAILY 30 mL 12  azelastine  (OPTIVAR ) 0.05 % ophthalmic solution Place 2 drops into both eyes 2 (two) times daily.     buPROPion  (WELLBUTRIN  XL) 150 MG 24 hr tablet Take 1 tablet (150 mg total) by mouth daily. 90 tablet 3   busPIRone  (BUSPAR ) 10 MG tablet Take 10 mg by mouth 2 (two) times daily.     Carboxymethylcellul-Glycerin  (REFRESH OPTIVE) 1-0.9 % GEL Place 1 drop into both eyes daily.     donepezil  (ARICEPT ) 10 MG tablet Take 1 tablet (10 mg total) by mouth at bedtime. 90 tablet 0   FLUoxetine  (PROZAC ) 20 MG tablet Take 1 tablet (20 mg total) by mouth daily. 90 tablet 3   furosemide  (LASIX ) 20 MG tablet Take 1 tablet (20 mg total) by mouth daily. 30 tablet 0   gabapentin  (NEURONTIN ) 300 MG capsule Take 1 capsule (300 mg total) by mouth at bedtime. 30 capsule 0   Multiple Vitamins-Minerals (CERTAVITE/ANTIOXIDANTS) TABS Take 1 tablet by mouth at bedtime. 130 tablet 0   pantoprazole  (PROTONIX ) 40 MG tablet Take 1 tablet  (40 mg total) by mouth daily. 90 tablet 3   polyethylene glycol powder (GLYCOLAX /MIRALAX ) 17 GM/SCOOP powder Take 17 g by mouth daily. 238 g 0   senna-docusate (SENOKOT-S) 8.6-50 MG tablet Take 1 tablet by mouth at bedtime as needed for mild constipation. 30 tablet 0   empagliflozin  (JARDIANCE ) 25 MG TABS tablet Take 1 tablet (25 mg total) by mouth daily. 90 tablet 1   fluticasone  (FLONASE ) 50 MCG/ACT nasal spray Place 1 spray into both nostrils daily for 7 days. (Patient taking differently: Place 2 sprays into both nostrils 3 (three) times daily.) 9.9 mL 2   sitaGLIPtin  (JANUVIA ) 100 MG tablet Take 1 tablet (100 mg total) by mouth daily. 90 tablet 3   No current facility-administered medications for this visit.    Allergies-reviewed and updated No Known Allergies  Social History   Socioeconomic History   Marital status: Single    Spouse name: Not on file   Number of children: 1   Years of education: 12   Highest education level: High school graduate  Occupational History   Not on file  Tobacco Use   Smoking status: Never   Smokeless tobacco: Never  Vaping Use   Vaping status: Never Used  Substance and Sexual Activity   Alcohol  use: No   Drug use: No   Sexual activity: Not Currently  Other Topics Concern   Not on file  Social History Narrative   She tries to stay active, but over the last year (20 19-20 20), she has been very limited by her right knee pain.   Is able to go grocery shopping.   Social Drivers of Corporate investment banker Strain: Low Risk  (04/29/2023)   Overall Financial Resource Strain (CARDIA)    Difficulty of Paying Living Expenses: Not hard at all  Food Insecurity: No Food Insecurity (07/03/2023)   Hunger Vital Sign    Worried About Running Out of Food in the Last Year: Never true    Ran Out of Food in the Last Year: Never true  Transportation Needs: No Transportation Needs (07/03/2023)   PRAPARE - Administrator, Civil Service (Medical):  No    Lack of Transportation (Non-Medical): No  Physical Activity: Insufficiently Active (04/29/2023)   Exercise Vital Sign    Days of Exercise per Week: 5 days    Minutes of Exercise per Session: 20 min  Stress: Stress Concern Present (04/29/2023)   [place]  [employer] - Occupational Stress Questionnaire    Feeling of Stress : To some extent  Social Connections: Moderately Isolated (07/03/2023)   Social Connection and Isolation Panel    Frequency of Communication with Friends and Family: More than three times a week    Frequency of Social Gatherings with Friends and Family: More than three times a week    Attends Religious Services: More than 4 times per year    Active Member of [employer] or Organizations: No    Attends Engineer, structural: Never    Marital Status: Never married        Objective:  Physical Exam: BP 128/70   Pulse 80   Temp 98 F (36.7 C) (Temporal)   Ht 5' 2 (1.575 m)   Wt 179 lb 9.6 oz (81.5 kg)   SpO2 98%   BMI 32.85 kg/m   Gen: NAD, resting comfortably CV: RRR with no murmurs appreciated Pulm: NWOB, CTAB with no crackles, wheezes, or rhonchi GI: Normal bowel sounds present. Soft, Nontender, Nondistended. MSK: No edema, cyanosis, or clubbing noted Skin: Warm, dry Psych: Normal affect and thought content  Time Spent: 45 minutes of total time was spent on the date of the encounter performing the following actions: chart review prior to seeing the patient including recent hospitalization, obtaining history, performing a medically necessary exam, counseling on the treatment plan, placing orders, and documenting in our EHR.       Worth HERO. Kennyth, MD 07/22/2023 10:46 AM

## 2023-07-22 NOTE — Assessment & Plan Note (Signed)
 Symptoms are not fully controlled.  We did discuss having her follow back up with psychiatry however she has declined.  She is currently on gabapentin  300 mg nightly.  BuSpar  was discontinued during her recent hospitalization and she did not find that was effective.  She did ask about starting alprazolam  however recommended against this due to concern for side effects.  She will let us  know if she would like to be referred to psychiatry.  We have tried several other medications in the past without much benefit and she does not wish to start any other additional medications at this point.

## 2023-07-22 NOTE — Patient Instructions (Addendum)
 It was very nice to see you today!  I will refill your medications today and refer you to vestibular rehab.   Return for Follow Up.   Take care, Dr Kennyth  PLEASE NOTE:  If you had any lab tests, please let us  know if you have not heard back within a few days. You may see your results on mychart before we have a chance to review them but we will give you a call once they are reviewed by us .   If we ordered any referrals today, please let us  know if you have not heard from their office within the next week.   If you had any urgent prescriptions sent in today, please check with the pharmacy within an hour of our visit to make sure the prescription was transmitted appropriately.   Please try these tips to maintain a healthy lifestyle:  Eat at least 3 REAL meals and 1-2 snacks per day.  Aim for no more than 5 hours between eating.  If you eat breakfast, please do so within one hour of getting up.   Each meal should contain half fruits/vegetables, one quarter protein, and one quarter carbs (no bigger than a computer mouse)  Cut down on sweet beverages. This includes juice, soda, and sweet tea.   Drink at least 1 glass of water  with each meal and aim for at least 8 glasses per day  Exercise at least 150 minutes every week.

## 2023-07-22 NOTE — Assessment & Plan Note (Signed)
At goal today on amlodipine 5 mg daily. 

## 2023-07-22 NOTE — Assessment & Plan Note (Signed)
 Had extensive neurologic workup during her hospitalization which was negative for neurologic causes.  Still having quite a bit of issue with positional vertigo.  We had referred her to vestibular rehab in the past however she declined now she is now agreeable to this.  Will place referral today.  May consider having her follow back up with ENT depending on response to vestibular rehab.

## 2023-07-26 ENCOUNTER — Telehealth: Payer: Self-pay | Admitting: Family Medicine

## 2023-07-26 ENCOUNTER — Other Ambulatory Visit: Payer: Self-pay | Admitting: *Deleted

## 2023-07-26 MED ORDER — BLOOD GLUCOSE TEST VI STRP: 1.0000 | ORAL_STRIP | Freq: Three times a day (TID) | 0 refills | Status: AC

## 2023-07-26 MED ORDER — ACCU-CHEK SOFTCLIX LANCETS MISC: 12 refills | Status: DC

## 2023-07-26 NOTE — Telephone Encounter (Signed)
 Patient requesting Rx Hydrocodone   Not in current medication list

## 2023-07-26 NOTE — Telephone Encounter (Signed)
 Copied from CRM [id number]. Topic: Clinical - Medication Refill >> Jul 26, 2023 10:22 AM Tiffini S wrote: Medication: Softclix laser stripes, Glucose test strips, hydrocodone  325mg     Has the patient contacted their pharmacy? Yes, pharmacy faxed request to the clinic last Thursday and have had no response (Agent: If no, request that the patient contact the pharmacy for the refill. If patient does not wish to contact the pharmacy document the reason why and proceed with request.) (Agent: If yes, when and what did the pharmacy advise?)  This is the patient's preferred pharmacy:  [facility] [address] [place] - [address] [place] [address] Phone: [phone] Fax: [phone]   Is this the correct pharmacy for this prescription? Yes If no, delete pharmacy and type the correct one.   Has the prescription been filled recently? Yes  Is the patient out of the medication? Yes, patient is completely out of both   Has the patient been seen for an appointment in the last year OR does the patient have an upcoming appointment? Yes  Can we respond through MyChart? No, please call the patient   Agent: Please be advised that Rx refills may take up to 3 business days. We ask that you follow-up with your pharmacy.

## 2023-07-28 MED ORDER — HYDROCODONE-ACETAMINOPHEN 5-325 MG PO TABS: 1.0000 | ORAL_TABLET | Freq: Three times a day (TID) | ORAL | 0 refills | Status: DC | PRN

## 2023-07-28 NOTE — Addendum Note (Signed)
 Addended by: Edan Serratore M on: 07/28/2023 10:24 AM   Modules accepted: Orders

## 2023-07-29 ENCOUNTER — Other Ambulatory Visit: Payer: Self-pay | Admitting: Pharmacist

## 2023-07-29 MED ORDER — GABAPENTIN 300 MG PO CAPS: 300.0000 mg | ORAL_CAPSULE | Freq: Every day | ORAL | 2 refills | Status: DC

## 2023-07-29 MED ORDER — FUROSEMIDE 20 MG PO TABS: 20.0000 mg | ORAL_TABLET | Freq: Every day | ORAL | 0 refills | Status: AC

## 2023-07-29 MED ORDER — AMLODIPINE BESYLATE 2.5 MG PO TABS: 5.0000 mg | ORAL_TABLET | Freq: Every day | ORAL | 1 refills | Status: DC

## 2023-07-29 NOTE — Progress Notes (Signed)
 07/29/2023 Name: Casey [name] MRN: [id number] DOB: 28-Dec-1935  Chief Complaint  Patient presents with   Medication Management   Medication Adherence    Casey [name] is a 88 y.o. year old female who presented for a telephone visit.   They were referred to the pharmacist by their PCP for assistance in managing complex medication management.    Subjective:  Care Team: Primary Care Provider: Kennyth Worth HERO, MD ; Next Scheduled Visit: 09/30/2023 Neurologist: Dr Chalice; Next Scheduled Visit: 10/25/2023  Medication Access/Adherence  Current Pharmacy:  [id number], [place] - [address] [place] [address] Phone: [phone] Fax: [phone]  [facility] #[id number] [place], [place] - [address] [place] [address] Phone: [phone] Fax: [phone]  [facility] [address] [place] [address] Phone: [phone] Fax: [phone]   Patient reports affordability concerns with their medications: No  Patient reports access/transportation concerns to their pharmacy: No  Patient reports adherence concerns with their medications:  Yes  She had questions about why she has 2 bottles of furosemide ; Patient also reports she is still taking potassium but is not on her med list and she has NOT been taking donepezil  but it is still on her med list.   6/13 to 6/16 - hospitalization. Presented with slurred speech, shaking, dizziness and headache. Stroke and seizure both r/o; Suspect medication side effect.  Discharge to SNF was recommended for PT - patient refused. Also recommended vestibular rehab and ENT referral.  Noted to have low potassium - replaced but was removed from her medication list at discharge. Mrs Pikus states she is still taking potassium 20 mEg daily.   Medications stopped at discharge 07/05/2023: buspirone  10mg  potassium  20 mEq  Medications changed at discharge 07/05/2023 -  Lowered dose of gabapentin  300mg  at bedtime (previously was 3 times per day) Lowered dose of furosemide  20mg  once daily )previously was on 40mg  daily) - patient has bottles of both strengths and was not sure which one to take so she has not taken either for the last few days.   Diabetes:  Current medications: Jardiance  25mg  daily and Januvia  100mg  daily   Current glucose readings: has not checked since she returned from SNF because she ran out of testing supplies. Dr Kennyth sent updated Rx 3 days aago but patient has not picked up yet. She plans to pick up from [facility] today. Using Accu-Chek Guide meter; usually tests 2 to 3 times per day.   Patient denies hypoglycemic s/sx including no recent dizziness, shakiness, sweating. Patient denies hyperglycemic symptoms including no polyuria, polydipsia, polyphagia, nocturia, neuropathy, blurred vision.   Depression/Anxiety:  Current medications:  fluoxetine  20mg  daily and buproprion XL 150mg  daily   Medications tried in the past: buspirone , duloxetine  and quetiapine   Behavioral Health support: patient has declined to return to see psychiatry   Objective:  Lab Results  Component Value Date   HGBA1C 8.5 (A) 06/29/2023    Lab Results  Component Value Date   CREATININE 1.16 (H) 07/05/2023   BUN 16 07/05/2023   NA 141 07/05/2023   K 4.5 07/05/2023   CL 102 07/05/2023   CO2 28 07/05/2023    Lab Results  Component Value Date   CHOL 172 05/31/2022   HDL 44 05/31/2022   LDLCALC 111 (H) 05/31/2022   TRIG 83 05/31/2022   CHOLHDL 3.9 05/31/2022    Medications Reviewed  Today     Reviewed by Carla Milling, RPH-CPP (Pharmacist) on 07/29/23 at 1049  Med List Status: <None>   Medication Order Taking? Sig Documenting Provider Last Dose Status Informant  Accu-Chek Softclix Lancets lancets [id number] Yes Use as instructed Kennyth Worth HERO, MD  Active   acetaminophen  (TYLENOL ) 500  MG tablet [id number] Yes Take 1,000 mg by mouth every 6 (six) hours as needed. [provider]  Active Self, Pharmacy Records  amLODipine  (NORVASC ) 2.5 MG tablet [id number] Yes Take 2 tablets (5 mg total) by mouth daily. Kennyth Worth HERO, MD  Active Self, Pharmacy Records  atorvastatin  (LIPITOR) 10 MG tablet [id number] Yes Take 1 tablet (10 mg total) by mouth daily. Kennyth Worth HERO, MD  Active Self, Pharmacy Records  azelastine  (ASTELIN ) 0.1 % nasal spray [id number] Yes INSTILL 1 SPRAY INTO BOTH NOSTRILS TWICE DAILY Kennyth Worth HERO, MD  Active Self, Pharmacy Records  azelastine  (OPTIVAR ) 0.05 % ophthalmic solution [id number]  Place 2 drops into both eyes 2 (two) times daily. [provider]  Active Self, Pharmacy Records  buPROPion  (WELLBUTRIN  XL) 150 MG 24 hr tablet [id number] Yes Take 1 tablet (150 mg total) by mouth daily. Kennyth Worth HERO, MD  Active Self, Pharmacy Records  busPIRone  (BUSPAR ) 10 MG tablet [id number]  Take 10 mg by mouth 2 (two) times daily.  Patient not taking: Reported on 07/29/2023   [provider]  Active   Carboxymethylcellul-Glycerin  (REFRESH OPTIVE) 1-0.9 % GEL [id number] Yes Place 1 drop into both eyes daily. [provider]  Active Self, Pharmacy Records  donepezil  (ARICEPT ) 10 MG tablet [id number]  Take 1 tablet (10 mg total) by mouth at bedtime.  Patient not taking: Reported on 07/29/2023   Kennyth Worth HERO, MD  Active Self, Pharmacy Records  empagliflozin  (JARDIANCE ) 25 MG TABS tablet [id number] Yes Take 1 tablet (25 mg total) by mouth daily. Kennyth Worth HERO, MD  Active   FLUoxetine  (PROZAC ) 20 MG tablet [id number] Yes Take 1 tablet (20 mg total) by mouth daily. Kennyth Worth HERO, MD  Active Self, Pharmacy Records  fluticasone  (FLONASE ) 50 MCG/ACT nasal spray [id number] Yes Place 1 spray into both nostrils daily for 7 days.  Patient taking differently: Place 1 spray into both nostrils in the morning and at bedtime.   Randol Simmonds, MD  Active Self,  Pharmacy Records  furosemide  (LASIX ) 20 MG tablet [id number] Yes Take 1 tablet (20 mg total) by mouth daily. Tobie Yetta HERO, MD  Active   gabapentin  (NEURONTIN ) 300 MG capsule [id number] Yes Take 1 capsule (300 mg total) by mouth at bedtime. Tobie Yetta HERO, MD  Active   Glucose Blood (BLOOD GLUCOSE TEST STRIPS) STRP [id number] Yes 1 each by In Vitro route in the morning, at noon, and at bedtime. May substitute to any manufacturer covered by patient's insurance. Kennyth Worth HERO, MD  Active   HYDROcodone -acetaminophen  (NORCO/VICODIN) 5-325 MG tablet [id number] Yes Take 1 tablet by mouth 3 (three) times daily as needed for moderate pain (pain score 4-6). Kennyth Worth HERO, MD  Active   Multiple Vitamins-Minerals (CERTAVITE/ANTIOXIDANTS) TABS [id number] Yes Take 1 tablet by mouth at bedtime. Milissa Tod PARAS, MD  Active Self, Pharmacy Records  pantoprazole  (PROTONIX ) 40 MG tablet [id number] Yes Take 1 tablet (40 mg total) by mouth daily. Kennyth Worth HERO, MD  Active Self, Pharmacy Records  polyethylene glycol powder (GLYCOLAX /MIRALAX ) 17 GM/SCOOP powder [id number] Yes Take 17 g by mouth daily. Kennyth Worth HERO, MD  Active Self, Pharmacy Records  potassium chloride  SA (KLOR-CON   M) 20 MEQ tablet [id number] Yes Take 20 mEq by mouth daily. [provider]  Active   senna-docusate (SENOKOT-S) 8.6-50 MG tablet [id number] Yes Take 1 tablet by mouth at bedtime as needed for mild constipation. Milissa Tod PARAS, MD  Active Self, Pharmacy Records  sitaGLIPtin  (JANUVIA ) 100 MG tablet [id number] Yes Take 1 tablet (100 mg total) by mouth daily. Kennyth Worth HERO, MD  Active               Assessment/Plan:   Diabetes:Currently uncontrolled - Reviewed goal A1c, goal fasting, and goal 2 hour post prandial glucose. Restart checking blood glucose 2 to 3 times per day - Recommend to continue Jardiance  and Januiva   Depression/Anxiety:Currently controlled - Recommend to continue fluoxetine  20mg  daily and bupropion  ER  150mg  daily  - Reminded her that buspirone  was stopped when she left the hospital   Medication Management: Current medication strategy not optimized.  - Suggested use of weekly pill box to organize medications - Created list of medication, indication, and administration time. Once I verify if she should be taking potassium and donepezil  will mail patient a copy of updated medication list and administration time recommendations.   - Discussed collaboration with local pharmacies for adherence packaging. Patient declined this option today. - Patient is advised to taking only furosemide  20mg  once daily. Contacted her pharmacy to cancel an refill on furosemide  40mg   - She will need the following medication refills updated - amlodipine , gabapentin  300mg  (once at bedtime) and furosemide  20mg  - will coordinate with her PCP to get refills   Follow Up Plan: 3 to 4 weeks.   Madelin Ray, PharmD Clinical Pharmacist [facility] Primary [facility]  Population Health [phone]

## 2023-07-30 ENCOUNTER — Other Ambulatory Visit: Payer: Self-pay

## 2023-08-02 ENCOUNTER — Other Ambulatory Visit: Payer: Self-pay | Admitting: Licensed Clinical Social Worker

## 2023-08-02 NOTE — Patient Outreach (Signed)
 Complex Care Management   Visit Note  08/02/2023  Name:  DANIA MARSAN MRN: [id number] DOB: 1935/05/07  Situation: Referral received for Complex Care Management related to Mental/Behavioral Health diagnosis GAD I obtained verbal consent from Patient.  Visit completed with patient  on the phone  Background:   Past Medical History:  Diagnosis Date   ALLERGIC RHINITIS    Allergy    ANXIETY    ARM PAIN, RIGHT    BENIGN POSITIONAL VERTIGO    Carcinoma in situ of colon    CONSTIPATION    CYSTOCELE WITH INCOMPLETE UTERINE PROLAPSE    Diabetes mellitus without complication (HCC)    ESOPHAGEAL STRICTURE    FATIGUE    FLANK PAIN, LEFT    Headache(784.0)    HIP PAIN, LEFT    HYPERCHOLESTEROLEMIA    HYPERGLYCEMIA    HYPERTENSION, BENIGN ESSENTIAL 03/03/2007   LEG EDEMA, BILATERAL    Microscopic hematuria    OBESITY    OSTEOARTHRITIS, KNEE    OSTEOPOROSIS    Peripheral neuropathy, idiopathic    Problems with swallowing and mastication    SINUSITIS, RECURRENT    SPRAIN&STRAIN OTH SPEC SITES SHOULDER&UPPER ARM     Assessment: Patient Reported Symptoms:  Cognitive Cognitive Status: Normal speech and language skills Cognitive/Intellectual Conditions Management [RPT]: None reported or documented in medical history or problem list   Health Maintenance Behaviors: None Healing Pattern: Unsure Health Facilitated by: Stress management  Neurological Neurological Review of Symptoms: No symptoms reported    HEENT HEENT Symptoms Reported: No symptoms reported      Cardiovascular Cardiovascular Symptoms Reported: No symptoms reported    Respiratory Respiratory Symptoms Reported: No symptoms reported    Endocrine Endocrine Symptoms Reported: No symptoms reported Is patient diabetic?: Yes Is patient checking blood sugars at home?: Yes List most recent blood sugar readings, include date and time of day: 160 Endocrine Self-Management Outcome: 4 (good) Endocrine Comment: Was able to  teach what to do for high blood sugar reading  Gastrointestinal Gastrointestinal Symptoms Reported: No symptoms reported      Genitourinary Genitourinary Symptoms Reported: Not assessed    Integumentary Integumentary Symptoms Reported: Not assessed    Musculoskeletal Musculoskelatal Symptoms Reviewed: Unsteady gait Additional Musculoskeletal Details: Reports some difficulty with balance Musculoskeletal Management Strategies: Activity, Medical device Musculoskeletal Self-Management Outcome: 3 (uncertain) Falls in the past year?: No Number of falls in past year: 1 or less Was there an injury with Fall?: No Fall Risk Category Calculator: 0 Patient Fall Risk Level: Low Fall Risk Patient at Risk for Falls Due to: Impaired balance/gait  Psychosocial Psychosocial Symptoms Reported: Anxiety - if selected complete GAD, Depression - if selected complete PHQ 2-9 Behavioral Management Strategies: Activity, Coping strategies, Counseling, Medication therapy Behavioral Health Self-Management Outcome: 3 (uncertain) Major Change/Loss/Stressor/Fears (CP): Medical condition, self Techniques to Cope with Loss/Stress/Change: Counseling, Medication, Withdraw Quality of Family Relationships: helpful, involved, supportive Do you feel physically threatened by others?: No      08/02/2023   10:36 AM  Depression screen PHQ 2/9  Decreased Interest 2  Down, Depressed, Hopeless 2  PHQ - 2 Score 4  Altered sleeping 0  Tired, decreased energy 2  Change in appetite 1  Feeling bad or failure about yourself  3  Trouble concentrating 1  Moving slowly or fidgety/restless 0  Suicidal thoughts 0  PHQ-9 Score 11    There were no vitals filed for this visit.  Medications Reviewed Today     Reviewed by Kit Alm DELENA KEN (Social  Worker) on 08/02/23 at 1025  Med List Status: <None>   Medication Order Taking? Sig Documenting Provider Last Dose Status Informant  Accu-Chek Softclix Lancets lancets [id number]   Use as instructed Kennyth Worth HERO, MD  Active   acetaminophen  (TYLENOL ) 500 MG tablet [id number]  Take 1,000 mg by mouth every 6 (six) hours as needed. [provider]  Active Self, Pharmacy Records  amLODipine  (NORVASC ) 2.5 MG tablet [id number] Yes Take 2 tablets (5 mg total) by mouth daily. Kennyth Worth HERO, MD  Active   atorvastatin  (LIPITOR) 10 MG tablet [id number] Yes Take 1 tablet (10 mg total) by mouth daily. Kennyth Worth HERO, MD  Active Self, Pharmacy Records  azelastine  (ASTELIN ) 0.1 % nasal spray [id number] Yes INSTILL 1 SPRAY INTO BOTH NOSTRILS TWICE DAILY Kennyth Worth HERO, MD  Active Self, Pharmacy Records  azelastine  (OPTIVAR ) 0.05 % ophthalmic solution [id number]  Place 2 drops into both eyes 2 (two) times daily. [provider]  Active Self, Pharmacy Records  buPROPion  (WELLBUTRIN  XL) 150 MG 24 hr tablet [id number] Yes Take 1 tablet (150 mg total) by mouth daily. Kennyth Worth HERO, MD  Active Self, Pharmacy Records  Carboxymethylcellul-Glycerin  [facility] OPTIVE) 1-0.9 % GEL [id number]  Place 1 drop into both eyes daily. [provider]  Active Self, Pharmacy Records  donepezil  (ARICEPT ) 10 MG tablet [id number]  Take 1 tablet (10 mg total) by mouth at bedtime.  Patient not taking: Reported on 08/02/2023   Kennyth Worth HERO, MD  Active Self, Pharmacy Records  empagliflozin  (JARDIANCE ) 25 MG TABS tablet [id number] Yes Take 1 tablet (25 mg total) by mouth daily. Kennyth Worth HERO, MD  Active   FLUoxetine  (PROZAC ) 20 MG tablet [id number] Yes Take 1 tablet (20 mg total) by mouth daily. Kennyth Worth HERO, MD  Active Self, Pharmacy Records  fluticasone  (FLONASE ) 50 MCG/ACT nasal spray [id number]  Place 1 spray into both nostrils daily for 7 days.  Patient taking differently: Place 1 spray into both nostrils in the morning and at bedtime.   Randol Simmonds, MD  Active Self, Pharmacy Records  furosemide  (LASIX ) 20 MG tablet [id number]  Take 1 tablet (20 mg total) by mouth daily. Kennyth Worth HERO, MD   Active   gabapentin  (NEURONTIN ) 300 MG capsule [id number] Yes Take 1 capsule (300 mg total) by mouth at bedtime. Kennyth Worth HERO, MD  Active   Glucose Blood (BLOOD GLUCOSE TEST STRIPS) STRP [id number]  1 each by In Vitro route in the morning, at noon, and at bedtime. May substitute to any manufacturer covered by patient's insurance. Kennyth Worth HERO, MD  Active   HYDROcodone -acetaminophen  (NORCO/VICODIN) 5-325 MG tablet [id number]  Take 1 tablet by mouth 3 (three) times daily as needed for moderate pain (pain score 4-6). Kennyth Worth HERO, MD  Active   Multiple Vitamins-Minerals (CERTAVITE/ANTIOXIDANTS) TABS [id number]  Take 1 tablet by mouth at bedtime. Milissa Tod PARAS, MD  Active Self, Pharmacy Records  pantoprazole  (PROTONIX ) 40 MG tablet [id number] Yes Take 1 tablet (40 mg total) by mouth daily. Kennyth Worth HERO, MD  Active Self, Pharmacy Records  polyethylene glycol powder (GLYCOLAX /MIRALAX ) 17 GM/SCOOP powder [id number]  Take 17 g by mouth daily. Kennyth Worth HERO, MD  Active Self, Pharmacy Records  potassium chloride  SA (KLOR-CON  M) 20 MEQ tablet [id number] Yes Take 20 mEq by mouth daily. [provider]  Active   senna-docusate (SENOKOT-S) 8.6-50 MG tablet [id number]  Take 1 tablet by mouth at bedtime as needed for mild constipation. Milissa Tod PARAS, MD  Active  Self, Pharmacy Records  sitaGLIPtin  (JANUVIA ) 100 MG tablet [id number] Yes Take 1 tablet (100 mg total) by mouth daily. Kennyth Worth HERO, MD  Active             Recommendation:   Continue to take medications as prescribed Monitor for changes in mood and utilize coping skills as needed.  Follow Up Plan:   Telephone follow up appointment date/time:  08/23/2023  Alm Armor, LCSW [place], [facility] Licensed Clinical Social Worker Care Coordinator [phone]

## 2023-08-02 NOTE — Patient Instructions (Signed)
 Visit Information  Thank you for taking time to visit with me today. Please don't hesitate to contact me if I can be of assistance to you before our next scheduled appointment.  Our next appointment is by telephone on 08/23/2023. Please call the care guide team at [phone] if you need to cancel or reschedule your appointment.   Following is a copy of your care plan:   Goals Addressed             This Visit's Progress    VBCI Social Work Care Plan       Problems:   Disease Management support and education needs related to Anxiety with Excessive Worry,  CSW Clinical Goal(s):   Over the next 6 weeks the Patient will demonstrate a reduction in symptoms related to Anxiety with Excessive Worry, .  Interventions:  Mental Health:  Evaluation of current treatment plan related to Anxiety with Excessive Worry, Active listening / Reflection utilized Behavioral Activation reviewed Depression screen reviewed Emotional Support Provided Mindfulness or Relaxation training provided Problem Solving /Task Center strategies reviewed Provided general psycho-education for mental health needs  Patient Goals/Self-Care Activities:  Continue taking your medication as prescribed.   Monitor for changes in mood and utilize coping skills as needed  Plan:   Telephone follow up appointment with care management team member scheduled for:  08/23/2023        Please call the Suicide and Crisis Lifeline: 988 if you are experiencing a Mental Health or Behavioral Health Crisis or need someone to talk to.  Patient verbalizes understanding of instructions and care plan provided today and agrees to view in MyChart. Active MyChart status and patient understanding of how to access instructions and care plan via MyChart confirmed with patient.     Alm Armor, LCSW [place], [facility] Licensed Clinical Social Worker Care Coordinator [phone]

## 2023-08-23 ENCOUNTER — Telehealth: Payer: Self-pay | Admitting: Licensed Clinical Social Worker

## 2023-08-23 ENCOUNTER — Encounter: Payer: Self-pay | Admitting: Licensed Clinical Social Worker

## 2023-08-26 ENCOUNTER — Telehealth: Payer: Self-pay | Admitting: Pharmacist

## 2023-08-26 ENCOUNTER — Other Ambulatory Visit: Payer: Self-pay | Admitting: Pharmacist

## 2023-08-26 NOTE — Telephone Encounter (Signed)
 Attempt was made to contact patient by phone today for follow up by Clinical Pharmacist regarding medication adherence.  Unable to reach patient. No VM available to leave a message.

## 2023-09-01 ENCOUNTER — Other Ambulatory Visit: Payer: Self-pay | Admitting: Family Medicine

## 2023-09-01 NOTE — Telephone Encounter (Signed)
 Copied from CRM [id number]. Topic: Clinical - Medication Refill >> Sep 01, 2023 12:52 PM Aleatha C wrote: Medication: HYDROcodone -acetaminophen  (NORCO/VICODIN) 5-325 MG tablet  Has the patient contacted their pharmacy? No (Agent: If no, request that the patient contact the pharmacy for the refill. If patient does not wish to contact the pharmacy document the reason why and proceed with request.) (Agent: If yes, when and what did the pharmacy advise?)  This is the patient's preferred pharmacy:  [facility] [address] [place] - [address] [place] [address] Phone: [phone] Fax: [phone]  Is this the correct pharmacy for this prescription? Yes If no, delete pharmacy and type the correct one.   Has the prescription been filled recently? No  Is the patient out of the medication? No  Has the patient been seen for an appointment in the last year OR does the patient have an upcoming appointment? Yes  Can we respond through MyChart? No  Agent: Please be advised that Rx refills may take up to 3 business days. We ask that you follow-up with your pharmacy.

## 2023-09-02 MED ORDER — HYDROCODONE-ACETAMINOPHEN 5-325 MG PO TABS: 1.0000 | ORAL_TABLET | Freq: Three times a day (TID) | ORAL | 0 refills | Status: DC | PRN

## 2023-09-10 ENCOUNTER — Other Ambulatory Visit: Payer: Self-pay | Admitting: Pharmacist

## 2023-09-10 ENCOUNTER — Telehealth: Payer: Self-pay | Admitting: Pharmacist

## 2023-09-10 NOTE — Telephone Encounter (Signed)
 Second attempt was made to contact patient by phone today for follow up by Clinical Pharmacist regarding medication adherence.  Unable to reach patient and no VM available

## 2023-09-21 ENCOUNTER — Other Ambulatory Visit: Payer: Self-pay | Admitting: Licensed Clinical Social Worker

## 2023-09-21 NOTE — Patient Outreach (Signed)
 Complex Care Management   Visit Note  09/21/2023  Name:  Casey [name] MRN: [id number] DOB: 1935/01/20  Situation: Referral received for Complex Care Management related to Mental/Behavioral Health diagnosis Anxiety I obtained verbal consent from Patient.  Visit completed with Patient  on the phone  Background:   Past Medical History:  Diagnosis Date   ALLERGIC RHINITIS    Allergy    ANXIETY    ARM PAIN, RIGHT    BENIGN POSITIONAL VERTIGO    Carcinoma in situ of colon    CONSTIPATION    CYSTOCELE WITH INCOMPLETE UTERINE PROLAPSE    Diabetes mellitus without complication (HCC)    ESOPHAGEAL STRICTURE    FATIGUE    FLANK PAIN, LEFT    Headache(784.0)    HIP PAIN, LEFT    HYPERCHOLESTEROLEMIA    HYPERGLYCEMIA    HYPERTENSION, BENIGN ESSENTIAL 03/03/2007   LEG EDEMA, BILATERAL    Microscopic hematuria    OBESITY    OSTEOARTHRITIS, KNEE    OSTEOPOROSIS    Peripheral neuropathy, idiopathic    Problems with swallowing and mastication    SINUSITIS, RECURRENT    SPRAIN&STRAIN OTH SPEC SITES SHOULDER&UPPER ARM     Assessment: Patient Reported Symptoms:  Cognitive Cognitive Status: Alert and oriented to person, place, and time, Normal speech and language skills, Insightful and able to interpret abstract concepts Cognitive/Intellectual Conditions Management [RPT]: None reported or documented in medical history or problem list   Health Maintenance Behaviors: None  Neurological Neurological Review of Symptoms: No symptoms reported    HEENT HEENT Symptoms Reported: No symptoms reported      Cardiovascular Cardiovascular Symptoms Reported: No symptoms reported    Respiratory Respiratory Symptoms Reported: No symptoms reported    Endocrine Endocrine Symptoms Reported: No symptoms reported Is patient diabetic?: Yes Is patient checking blood sugars at home?: Yes Endocrine Self-Management Outcome: 4 (good)  Gastrointestinal Gastrointestinal Symptoms Reported: No symptoms  reported      Genitourinary Genitourinary Symptoms Reported: Not assessed    Integumentary Integumentary Symptoms Reported: Not assessed    Musculoskeletal Musculoskelatal Symptoms Reviewed: Unsteady gait Musculoskeletal Management Strategies: Activity, Medical device Musculoskeletal Self-Management Outcome: 3 (uncertain)      Psychosocial Psychosocial Symptoms Reported: Anxiety - if selected complete GAD Behavioral Management Strategies: Activity, Coping strategies, Counseling, Medication therapy Behavioral Health Self-Management Outcome: 3 (uncertain) Major Change/Loss/Stressor/Fears (CP): Medical condition, self Techniques to Cope with Loss/Stress/Change: Counseling, Medication, Withdraw      09/21/2023    PHQ2-9 Depression Screening   Little interest or pleasure in doing things    Feeling down, depressed, or hopeless    PHQ-2 - Total Score    Trouble falling or staying asleep, or sleeping too much    Feeling tired or having little energy    Poor appetite or overeating     Feeling bad about yourself - or that you are a failure or have let yourself or your family down    Trouble concentrating on things, such as reading the newspaper or watching television    Moving or speaking so slowly that other people could have noticed.  Or the opposite - being so fidgety or restless that you have been moving around a lot more than usual    Thoughts that you would be better off dead, or hurting yourself in some way    PHQ2-9 Total Score    If you checked off any problems, how difficult have these problems made it for you to do your work, take care of things  at home, or get along with other people    Depression Interventions/Treatment      There were no vitals filed for this visit.  Medications Reviewed Today   Medications were not reviewed in this encounter     Recommendation:   Continue Current Plan of Care  Follow Up Plan:   Telephone follow up appointment date/time:   10/21/2023  Alm Armor, LCSW [facility], [facility] Licensed Clinical Social Worker Care Coordinator [phone]

## 2023-09-21 NOTE — Patient Instructions (Signed)
 Visit Information  Thank you for taking time to visit with me today. Please don't hesitate to contact me if I can be of assistance to you before our next scheduled appointment.  Your next care management appointment is by telephone on 10/21/2023    Please call the care guide team at [phone] if you need to cancel, schedule, or reschedule an appointment.   Please call the Suicide and Crisis Lifeline: 988 if you are experiencing a Mental Health or Behavioral Health Crisis or need someone to talk to.  Alm Armor, LCSW [facility], [facility] Licensed Clinical Social Worker Care Coordinator [phone]

## 2023-09-30 ENCOUNTER — Ambulatory Visit: Admitting: Family Medicine

## 2023-09-30 VITALS — BP 149/79 | HR 78 | Temp 97.2°F | Ht 62.0 in | Wt 181.0 lb

## 2023-09-30 DIAGNOSIS — I152 Hypertension secondary to endocrine disorders: Secondary | ICD-10-CM

## 2023-09-30 DIAGNOSIS — E1159 Type 2 diabetes mellitus with other circulatory complications: Secondary | ICD-10-CM

## 2023-09-30 DIAGNOSIS — H8110 Benign paroxysmal vertigo, unspecified ear: Secondary | ICD-10-CM | POA: Diagnosis not present

## 2023-09-30 DIAGNOSIS — E1129 Type 2 diabetes mellitus with other diabetic kidney complication: Secondary | ICD-10-CM | POA: Diagnosis not present

## 2023-09-30 DIAGNOSIS — Z23 Encounter for immunization: Secondary | ICD-10-CM | POA: Diagnosis not present

## 2023-09-30 DIAGNOSIS — G4700 Insomnia, unspecified: Secondary | ICD-10-CM

## 2023-09-30 DIAGNOSIS — Z7984 Long term (current) use of oral hypoglycemic drugs: Secondary | ICD-10-CM | POA: Diagnosis not present

## 2023-09-30 DIAGNOSIS — R809 Proteinuria, unspecified: Secondary | ICD-10-CM

## 2023-09-30 LAB — POCT GLYCOSYLATED HEMOGLOBIN (HGB A1C): Hemoglobin A1C: 7.3 % — AB (ref 4.0–5.6)

## 2023-09-30 MED ORDER — EMPAGLIFLOZIN 25 MG PO TABS: 25.0000 mg | ORAL_TABLET | Freq: Every day | ORAL | 1 refills | Status: AC

## 2023-09-30 MED ORDER — HYDROCODONE-ACETAMINOPHEN 5-325 MG PO TABS: 1.0000 | ORAL_TABLET | Freq: Three times a day (TID) | ORAL | 0 refills | Status: DC | PRN

## 2023-09-30 MED ORDER — QUETIAPINE FUMARATE 50 MG PO TABS: 50.0000 mg | ORAL_TABLET | Freq: Every day | ORAL | 5 refills | Status: AC

## 2023-09-30 NOTE — Patient Instructions (Signed)
 It was very nice to see you today!  VISIT SUMMARY: Today, we addressed your ongoing balance issues, diabetes management, high blood pressure, chronic pain, and sleep difficulties. We have made some adjustments to your medications and provided referrals for further evaluation.  YOUR PLAN: BENIGN PAROXYSMAL VERTIGO: You have persistent vertigo and balance issues, possibly related to an ear condition. -We will refer you to a vestibular specialist for further evaluation and management. -We will schedule physical therapy to help with your balance.  TYPE 2 DIABETES MELLITUS: Your blood sugar levels have been higher than desired, and you have been out of one of your medications. -Refill empagliflozin  (Jardiance ) 25 mg daily. -Continue taking sitagliptin  (Januvia ) 100 mg daily. -Monitor your blood glucose levels regularly.  HYPERTENSION: Your blood pressure has been elevated, and you have not been monitoring it at home. -We will recheck your blood pressure before you leave the clinic. -Please start monitoring your blood pressure regularly at home.  CHRONIC PAIN: You have chronic pain that is managed with hydrocodone . -We will refill your hydrocodone  prescription.  INSOMNIA: You have difficulty sleeping and have previously tried quetiapine  and mirtazapine . -We will prescribe quetiapine  for nighttime use.  Return in about 3 months (around 12/30/2023).   Take care, Dr Kennyth  PLEASE NOTE:  If you had any lab tests, please let us  know if you have not heard back within a few days. You may see your results on mychart before we have a chance to review them but we will give you a call once they are reviewed by us .   If we ordered any referrals today, please let us  know if you have not heard from their office within the next week.   If you had any urgent prescriptions sent in today, please check with the pharmacy within an hour of our visit to make sure the prescription was transmitted  appropriately.   Please try these tips to maintain a healthy lifestyle:  Eat at least 3 REAL meals and 1-2 snacks per day.  Aim for no more than 5 hours between eating.  If you eat breakfast, please do so within one hour of getting up.   Each meal should contain half fruits/vegetables, one quarter protein, and one quarter carbs (no bigger than a computer mouse)  Cut down on sweet beverages. This includes juice, soda, and sweet tea.   Drink at least 1 glass of water  with each meal and aim for at least 8 glasses per day  Exercise at least 150 minutes every week.

## 2023-09-30 NOTE — Assessment & Plan Note (Signed)
 A1c improved to 7.3.  Will continue Jardiance  25 mg daily and Januvia  100 mg daily.  Recheck A1c in 3 months.

## 2023-09-30 NOTE — Progress Notes (Signed)
   EVELLYN TUFF is a 88 y.o. female who presents today for an office visit.  Assessment/Plan:  Chronic Problems Addressed Today: BENIGN POSITIONAL VERTIGO Patient still with going issues with vertigo.  We discussed this at her most recent office visit and referred her to vestibular rehab however she was not able to schedule appointment.  Will place another referral today.  We also did discuss referral to ENT however she declined.  She has not had much success with meclizine  in the past.  Type 2 diabetes mellitus with microalbuminuria, without long-term current use of insulin  (HCC) A1c improved to 7.3.  Will continue Jardiance  25 mg daily and Januvia  100 mg daily.  Recheck A1c in 3 months.  Insomnia She has been off medications for a while although would like to go back on Seroquel .  She tolerated well previously.  Will refill today.  She will follow-up with us  in a few weeks if not improving.  Hypertension associated with diabetes (HCC) Mildly elevated today.  Well-controlled at last office visit.  She will continue amlodipine  5 mg daily and monitor at home.  She will let us  know if persistently elevated.  Recheck in 3 months.  Flu vaccine given today.    Subjective:  HPI:  See assessment / plan for status of chronic conditions.   Discussed the use of AI scribe software for clinical note transcription with the patient, who gave verbal consent to proceed.  History of Present Illness BYRDIE MIYAZAKI is an 88 year old female who presents with balance issues and medication management.  She has ongoing balance issues, describing a sensation of something in her ear that affects her balance. She states, 'I can't control my balance, I fall into stuff,' although she clarifies that she has not fallen but often has to catch herself. Her symptoms have not changed since her last visit. She has been taking meclizine  for her vertigo, but it has been ineffective. She has previously seen an ear, nose,  and throat doctor and had an MRI a few months ago which was normal.   Her blood sugar levels have been running between 190-200 mg/dL. She is currently taking Januvia  100 mg daily and has been out of Jardiance  25 mg for a short period. Her last A1c was 7.3%. She has assistance with her medications and expects a call from  [facility] tomorrow.   She reports issues with sleep, mentioning past use of Seroquel  and Remeron .  She has not been checking her blood pressure at home.         Objective:  Physical Exam: BP (!) 149/79   Pulse 78   Temp (!) 97.2 F (36.2 C) (Temporal)   Ht 5' 2 (1.575 m)   Wt 181 lb (82.1 kg)   SpO2 98%   BMI 33.11 kg/m   Gen: No acute distress, resting comfortably CV: Regular rate and rhythm with no murmurs appreciated Pulm: Normal work of breathing, clear to auscultation bilaterally with no crackles, wheezes, or rhonchi Neuro: Grossly normal, moves all extremities Psych: Normal affect and thought content      Glendon Fiser M. Kennyth, MD 09/30/2023 10:47 AM

## 2023-09-30 NOTE — Assessment & Plan Note (Signed)
 She has been off medications for a while although would like to go back on Seroquel .  She tolerated well previously.  Will refill today.  She will follow-up with us  in a few weeks if not improving.

## 2023-09-30 NOTE — Assessment & Plan Note (Signed)
 Mildly elevated today.  Well-controlled at last office visit.  She will continue amlodipine  5 mg daily and monitor at home.  She will let us  know if persistently elevated.  Recheck in 3 months.

## 2023-09-30 NOTE — Assessment & Plan Note (Signed)
 Patient still with going issues with vertigo.  We discussed this at her most recent office visit and referred her to vestibular rehab however she was not able to schedule appointment.  Will place another referral today.  We also did discuss referral to ENT however she declined.  She has not had much success with meclizine  in the past.

## 2023-10-01 ENCOUNTER — Telehealth: Payer: Self-pay

## 2023-10-11 ENCOUNTER — Telehealth: Payer: Self-pay | Admitting: *Deleted

## 2023-10-11 NOTE — Progress Notes (Unsigned)
 Complex Care Management Care Guide Note  10/11/2023 Name: [name] MRN: [id number] DOB: 1935-05-10  Casey [name] is a 88 y.o. year old female who is a primary care patient of Kennyth Worth HERO, MD and is actively engaged with the care management team. I reached out to Casey [name] by phone today to assist with re-scheduling  with the RN Case Manager.  Follow up plan: Unsuccessful telephone outreach attempt made. A HIPAA compliant phone message was left for the patient providing contact information and requesting a return call.  Thedford Franks, CMA, Care Guide [facility]  [facility], [facility] Guide Direct Dial: [phone]  Fax: [phone] Website: [email or web address]

## 2023-10-14 NOTE — Progress Notes (Signed)
 Complex Care Management Care Guide Note  10/14/2023 Name: Casey [name] MRN: [id number] DOB: April 07, 1935  Casey [name] is a 89 y.o. year old female who is a primary care patient of Kennyth Worth HERO, MD and is actively engaged with the care management team. I reached out to Casey [name] by phone today to assist with re-scheduling  with the RN Case Manager.  Follow up plan: Unsuccessful telephone outreach attempt made. A HIPAA compliant phone message was left for the patient providing contact information and requesting a return call.no further outreach attempts will be made due to inability to maintain patient contact.   Thedford Franks, CMA [place]  [facility], [facility] Guide Direct Dial: [phone]  Fax: [phone] Website: [email or web address]

## 2023-10-21 ENCOUNTER — Encounter: Payer: Self-pay | Admitting: Licensed Clinical Social Worker

## 2023-10-21 ENCOUNTER — Telehealth: Payer: Self-pay | Admitting: Licensed Clinical Social Worker

## 2023-10-25 ENCOUNTER — Emergency Department (HOSPITAL_COMMUNITY)

## 2023-10-25 ENCOUNTER — Encounter (HOSPITAL_COMMUNITY): Payer: Self-pay

## 2023-10-25 ENCOUNTER — Encounter: Payer: Self-pay | Admitting: Neurology

## 2023-10-25 ENCOUNTER — Institutional Professional Consult (permissible substitution): Admitting: Neurology

## 2023-10-25 ENCOUNTER — Inpatient Hospital Stay (HOSPITAL_COMMUNITY)
Admission: EM | Admit: 2023-10-25 | Discharge: 2023-10-29 | DRG: 493 | Disposition: A | Discharge status: Skilled Nursing Facility | Attending: Internal Medicine | Admitting: Internal Medicine

## 2023-10-25 ENCOUNTER — Other Ambulatory Visit: Payer: Self-pay

## 2023-10-25 DIAGNOSIS — Z96651 Presence of right artificial knee joint: Secondary | ICD-10-CM | POA: Diagnosis present

## 2023-10-25 DIAGNOSIS — F321 Major depressive disorder, single episode, moderate: Secondary | ICD-10-CM | POA: Diagnosis present

## 2023-10-25 DIAGNOSIS — E1122 Type 2 diabetes mellitus with diabetic chronic kidney disease: Secondary | ICD-10-CM | POA: Diagnosis present

## 2023-10-25 DIAGNOSIS — S82852A Displaced trimalleolar fracture of left lower leg, initial encounter for closed fracture: Principal | ICD-10-CM | POA: Diagnosis present

## 2023-10-25 DIAGNOSIS — F0390 Unspecified dementia without behavioral disturbance: Secondary | ICD-10-CM | POA: Diagnosis present

## 2023-10-25 DIAGNOSIS — E1169 Type 2 diabetes mellitus with other specified complication: Secondary | ICD-10-CM | POA: Diagnosis present

## 2023-10-25 DIAGNOSIS — S9305XA Dislocation of left ankle joint, initial encounter: Secondary | ICD-10-CM | POA: Diagnosis present

## 2023-10-25 DIAGNOSIS — E559 Vitamin D deficiency, unspecified: Secondary | ICD-10-CM | POA: Diagnosis present

## 2023-10-25 DIAGNOSIS — F0394 Unspecified dementia, unspecified severity, with anxiety: Secondary | ICD-10-CM | POA: Diagnosis present

## 2023-10-25 DIAGNOSIS — Z793 Long term (current) use of hormonal contraceptives: Secondary | ICD-10-CM

## 2023-10-25 DIAGNOSIS — S82892A Other fracture of left lower leg, initial encounter for closed fracture: Secondary | ICD-10-CM

## 2023-10-25 DIAGNOSIS — E7800 Pure hypercholesterolemia, unspecified: Secondary | ICD-10-CM | POA: Diagnosis present

## 2023-10-25 DIAGNOSIS — F0393 Unspecified dementia, unspecified severity, with mood disturbance: Secondary | ICD-10-CM | POA: Diagnosis present

## 2023-10-25 DIAGNOSIS — Z7985 Long-term (current) use of injectable non-insulin antidiabetic drugs: Secondary | ICD-10-CM

## 2023-10-25 DIAGNOSIS — Z833 Family history of diabetes mellitus: Secondary | ICD-10-CM

## 2023-10-25 DIAGNOSIS — W19XXXA Unspecified fall, initial encounter: Principal | ICD-10-CM | POA: Diagnosis present

## 2023-10-25 DIAGNOSIS — E1129 Type 2 diabetes mellitus with other diabetic kidney complication: Secondary | ICD-10-CM | POA: Diagnosis present

## 2023-10-25 DIAGNOSIS — Z86004 Personal history of in-situ neoplasm of other and unspecified digestive organs: Secondary | ICD-10-CM

## 2023-10-25 DIAGNOSIS — E1142 Type 2 diabetes mellitus with diabetic polyneuropathy: Secondary | ICD-10-CM | POA: Diagnosis present

## 2023-10-25 DIAGNOSIS — F411 Generalized anxiety disorder: Secondary | ICD-10-CM | POA: Diagnosis present

## 2023-10-25 DIAGNOSIS — E7849 Other hyperlipidemia: Secondary | ICD-10-CM | POA: Diagnosis present

## 2023-10-25 DIAGNOSIS — Y92009 Unspecified place in unspecified non-institutional (private) residence as the place of occurrence of the external cause: Secondary | ICD-10-CM | POA: Diagnosis not present

## 2023-10-25 DIAGNOSIS — E1159 Type 2 diabetes mellitus with other circulatory complications: Secondary | ICD-10-CM | POA: Diagnosis present

## 2023-10-25 DIAGNOSIS — I152 Hypertension secondary to endocrine disorders: Secondary | ICD-10-CM | POA: Diagnosis present

## 2023-10-25 DIAGNOSIS — J309 Allergic rhinitis, unspecified: Secondary | ICD-10-CM | POA: Diagnosis present

## 2023-10-25 DIAGNOSIS — I129 Hypertensive chronic kidney disease with stage 1 through stage 4 chronic kidney disease, or unspecified chronic kidney disease: Secondary | ICD-10-CM | POA: Diagnosis not present

## 2023-10-25 DIAGNOSIS — S93432A Sprain of tibiofibular ligament of left ankle, initial encounter: Secondary | ICD-10-CM | POA: Diagnosis present

## 2023-10-25 DIAGNOSIS — Z7984 Long term (current) use of oral hypoglycemic drugs: Secondary | ICD-10-CM

## 2023-10-25 DIAGNOSIS — Z79899 Other long term (current) drug therapy: Secondary | ICD-10-CM

## 2023-10-25 DIAGNOSIS — M810 Age-related osteoporosis without current pathological fracture: Secondary | ICD-10-CM | POA: Diagnosis present

## 2023-10-25 DIAGNOSIS — N1831 Chronic kidney disease, stage 3a: Secondary | ICD-10-CM | POA: Diagnosis present

## 2023-10-25 DIAGNOSIS — W010XXA Fall on same level from slipping, tripping and stumbling without subsequent striking against object, initial encounter: Secondary | ICD-10-CM | POA: Diagnosis present

## 2023-10-25 DIAGNOSIS — Z8249 Family history of ischemic heart disease and other diseases of the circulatory system: Secondary | ICD-10-CM

## 2023-10-25 LAB — CBC WITH DIFFERENTIAL/PLATELET
Abs Immature Granulocytes: 0.06 K/uL (ref 0.00–0.07)
Basophils Absolute: 0.1 K/uL (ref 0.0–0.1)
Basophils Relative: 1 %
Eosinophils Absolute: 0 K/uL (ref 0.0–0.5)
Eosinophils Relative: 0 %
HCT: 41.6 % (ref 36.0–46.0)
Hemoglobin: 13.4 g/dL (ref 12.0–15.0)
Immature Granulocytes: 1 %
Lymphocytes Relative: 14 %
Lymphs Abs: 1.4 K/uL (ref 0.7–4.0)
MCH: 28.3 pg (ref 26.0–34.0)
MCHC: 32.2 g/dL (ref 30.0–36.0)
MCV: 87.9 fL (ref 80.0–100.0)
Monocytes Absolute: 0.6 K/uL (ref 0.1–1.0)
Monocytes Relative: 6 %
Neutro Abs: 7.8 K/uL — ABNORMAL HIGH (ref 1.7–7.7)
Neutrophils Relative %: 78 %
Platelets: 234 K/uL (ref 150–400)
RBC: 4.73 MIL/uL (ref 3.87–5.11)
RDW: 15.4 % (ref 11.5–15.5)
WBC: 9.9 K/uL (ref 4.0–10.5)
nRBC: 0 % (ref 0.0–0.2)

## 2023-10-25 LAB — PROTIME-INR
INR: 1 (ref 0.8–1.2)
Prothrombin Time: 13.9 s (ref 11.4–15.2)

## 2023-10-25 LAB — BASIC METABOLIC PANEL WITH GFR
Anion gap: 15 (ref 5–15)
BUN: 11 mg/dL (ref 8–23)
CO2: 21 mmol/L — ABNORMAL LOW (ref 22–32)
Calcium: 9.8 mg/dL (ref 8.9–10.3)
Chloride: 102 mmol/L (ref 98–111)
Creatinine, Ser: 1.1 mg/dL — ABNORMAL HIGH (ref 0.44–1.00)
GFR, Estimated: 49 mL/min — ABNORMAL LOW (ref >60)
Glucose, Bld: 167 mg/dL — ABNORMAL HIGH (ref 70–99)
Potassium: 4.2 mmol/L (ref 3.5–5.1)
Sodium: 138 mmol/L (ref 135–145)

## 2023-10-25 MED ORDER — MORPHINE SULFATE (PF) 2 MG/ML IV SOLN
2.0000 mg | Freq: Once | INTRAVENOUS | Status: AC
  Administered 2023-10-25: 2 mg via INTRAVENOUS
  Filled 2023-10-25: qty 1

## 2023-10-25 MED ORDER — PROPOFOL 10 MG/ML IV BOLUS
INTRAVENOUS | Status: AC | PRN
  Administered 2023-10-25: 20 mg via INTRAVENOUS
  Administered 2023-10-25: 33 mg via INTRAVENOUS

## 2023-10-25 MED ORDER — PROPOFOL 10 MG/ML IV BOLUS
0.5000 mg/kg | Freq: Once | INTRAVENOUS | Status: DC
  Filled 2023-10-25: qty 20

## 2023-10-25 NOTE — H&P (Signed)
 History and Physical    Patient: Casey [name] FMW:[id number] DOB: February 19, 1935 DOA: 10/25/2023 DOS: the patient was seen and examined on 10/25/2023 PCP: Kennyth Worth HERO, MD  Patient coming from: Home  Chief Complaint:  Chief Complaint  Patient presents with   Fall   HPI: Casey [name] is a 88 y.o. female with medical history significant of essential hypertension, type 2 diabetes, hyperlipidemia, generalized anxiety disorder, dementia, depression, chronic kidney disease stage IIIa, who sustained a mechanical fall at home and twisted her ankle.  Patient came in with severe pain and swelling of the left ankle.  Did not hit her head.  Patient did not have any past history of fractures.  She was seen and evaluated in the ER.  X-rays and later CT of the ankle showed trimalleolar fracture of the left ankle.  This was reduced in the ER.  Orthopedics consulted and patient being admitted to the hospital for orthopedics to follow and treat.  Pain is better now.  Review of Systems: As mentioned in the history of present illness. All other systems reviewed and are negative. Past Medical History:  Diagnosis Date   ALLERGIC RHINITIS    Allergy    ANXIETY    ARM PAIN, RIGHT    BENIGN POSITIONAL VERTIGO    Carcinoma in situ of colon    CONSTIPATION    CYSTOCELE WITH INCOMPLETE UTERINE PROLAPSE    Diabetes mellitus without complication (HCC)    ESOPHAGEAL STRICTURE    FATIGUE    FLANK PAIN, LEFT    Headache(784.0)    HIP PAIN, LEFT    HYPERCHOLESTEROLEMIA    HYPERGLYCEMIA    HYPERTENSION, BENIGN ESSENTIAL 03/03/2007   LEG EDEMA, BILATERAL    Microscopic hematuria    OBESITY    OSTEOARTHRITIS, KNEE    OSTEOPOROSIS    Peripheral neuropathy, idiopathic    Problems with swallowing and mastication    SINUSITIS, RECURRENT    SPRAIN&STRAIN OTH SPEC SITES SHOULDER&UPPER ARM    Past Surgical History:  Procedure Laterality Date   BIOPSY  02/06/2022   Procedure: BIOPSY;  Surgeon: Elicia Claw,  MD;  Location: [facility] ENDOSCOPY;  Service: Gastroenterology;;   COLONOSCOPY     ESOPHAGOGASTRODUODENOSCOPY (EGD) WITH PROPOFOL  N/A 02/06/2022   Procedure: ESOPHAGOGASTRODUODENOSCOPY (EGD) WITH PROPOFOL ;  Surgeon: Elicia Claw, MD;  Location: [facility] ENDOSCOPY;  Service: Gastroenterology;  Laterality: N/A;   KNEE ARTHROPLASTY Right 07/24/2020   Procedure: COMPUTER ASSISTED TOTAL KNEE ARTHROPLASTY;  Surgeon: Fidel Rogue, MD;  Location: [facility] ORS;  Service: Orthopedics;  Laterality: Right;   LEFT HEART CATHETERIZATION WITH CORONARY ANGIOGRAM N/A 12/02/2010   Procedure: LEFT HEART CATHETERIZATION WITH CORONARY ANGIOGRAM;  Surgeon: Peter M Swazil[place], MD;  Location: [facility] CATH LAB;  Service: Cardiovascular;:: NORMAL CORONARY ARTERIES - NORMAL LV FUNCTION & EDP.     TRANSTHORACIC ECHOCARDIOGRAM  11/2010   Normal EF 60-65%. No RWMA.  Mild LAD dilation c/w Gr 1 DD.  -- PRETTY NORMAL ECHO   Social History:  reports that she has never smoked. She has never used smokeless tobacco. She reports that she does not drink alcohol  and does not use drugs.  No Known Allergies  Family History  Problem Relation Age of Onset   Coronary artery disease Mother 27   Cancer Father 19   Heart disease Father    Colon cancer Other    Diabetes Other    Coronary artery disease Sister    Diabetes type II Sister    Cancer Sister    Coronary artery  disease Brother    Kidney disease Brother    Coronary artery disease Brother    Breast cancer Neg Hx     Prior to Admission medications   Medication Sig Start Date End Date Taking? Authorizing Provider  Accu-Chek Softclix Lancets lancets Use as instructed 07/26/23   Kennyth Worth HERO, MD  acetaminophen  (TYLENOL ) 500 MG tablet Take 1,000 mg by mouth every 6 (six) hours as needed.    [provider]  amLODipine  (NORVASC ) 2.5 MG tablet Take 2 tablets (5 mg total) by mouth daily. 07/29/23   Kennyth Worth HERO, MD  atorvastatin  (LIPITOR) 10 MG tablet Take 1 tablet (10 mg total) by mouth  daily. 10/15/22   Kennyth Worth HERO, MD  azelastine  (ASTELIN ) 0.1 % nasal spray INSTILL 1 SPRAY INTO BOTH NOSTRILS TWICE DAILY 09/07/22   Kennyth Worth HERO, MD  azelastine  (OPTIVAR ) 0.05 % ophthalmic solution Place 2 drops into both eyes 2 (two) times daily.    [provider]  buPROPion  (WELLBUTRIN  XL) 150 MG 24 hr tablet Take 1 tablet (150 mg total) by mouth daily. 06/29/23   Kennyth Worth HERO, MD  Carboxymethylcellul-Glycerin  (REFRESH OPTIVE) 1-0.9 % GEL Place 1 drop into both eyes daily.    [provider]  empagliflozin  (JARDIANCE ) 25 MG TABS tablet Take 1 tablet (25 mg total) by mouth daily. 09/30/23   Kennyth Worth HERO, MD  FLUoxetine  (PROZAC ) 20 MG tablet Take 1 tablet (20 mg total) by mouth daily. 03/26/23   Kennyth Worth HERO, MD  fluticasone  (FLONASE ) 50 MCG/ACT nasal spray Place 1 spray into both nostrils daily for 7 days. 08/29/22 11/17/23  Randol Simmonds, MD  furosemide  (LASIX ) 20 MG tablet Take 1 tablet (20 mg total) by mouth daily. 07/29/23   Kennyth Worth HERO, MD  gabapentin  (NEURONTIN ) 300 MG capsule Take 1 capsule (300 mg total) by mouth at bedtime. 07/29/23   Kennyth Worth HERO, MD  HYDROcodone -acetaminophen  (NORCO/VICODIN) 5-325 MG tablet Take 1 tablet by mouth 3 (three) times daily as needed for moderate pain (pain score 4-6). 09/30/23   Kennyth Worth HERO, MD  Multiple Vitamins-Minerals (CERTAVITE/ANTIOXIDANTS) TABS Take 1 tablet by mouth at bedtime. 06/02/22   Milissa Tod PARAS, MD  pantoprazole  (PROTONIX ) 40 MG tablet Take 1 tablet (40 mg total) by mouth daily. 12/25/22   Parker, Caleb M, MD  polyethylene glycol powder (GLYCOLAX /MIRALAX ) 17 GM/SCOOP powder Take 17 g by mouth daily. 06/11/22   Kennyth Worth HERO, MD  potassium chloride  SA (KLOR-CON  M) 20 MEQ tablet Take 20 mEq by mouth daily.    [provider]  QUEtiapine  (SEROQUEL ) 50 MG tablet Take 1 tablet (50 mg total) by mouth at bedtime. 09/30/23   Kennyth Worth HERO, MD  senna-docusate (SENOKOT-S) 8.6-50 MG tablet Take 1 tablet by mouth  at bedtime as needed for mild constipation. 06/02/22   Milissa Tod PARAS, MD  sitaGLIPtin  (JANUVIA ) 100 MG tablet Take 1 tablet (100 mg total) by mouth daily. 07/22/23   Kennyth Worth HERO, MD    Physical Exam: Vitals:   10/25/23 1953 10/25/23 1956 10/25/23 2047 10/25/23 2049  BP: 91/64 (!) 140/78 (!) 143/76   Pulse: 98 88 84 83  Resp: 20 (!) 22  16  Temp: 99 F (37.2 C) 99.6 F (37.6 C)    TempSrc: Oral Oral    SpO2: 100% 99% 100% 99%  Weight:      Height:       Constitutional: Acutely ill looking no distress NAD, calm, comfortable Eyes: PERRL, lids and conjunctivae  normal ENMT: Mucous membranes are moist. Posterior pharynx clear of any exudate or lesions.Normal dentition.  Neck: normal, supple, no masses, no thyromegaly Respiratory: clear to auscultation bilaterally, no wheezing, no crackles. Normal respiratory effort. No accessory muscle use.  Cardiovascular: Regular rate and rhythm, no murmurs / rubs / gallops. No extremity edema. 2+ pedal pulses. No carotid bruits.  Abdomen: no tenderness, no masses palpated. No hepatosplenomegaly. Bowel sounds positive.  Musculoskeletal: Left ankle is swollen, tender, red skin: no rashes, lesions, ulcers. No induration Neurologic: CN 2-12 grossly intact. Sensation intact, DTR normal. Strength 5/5 in all 4.  Psychiatric: Normal judgment and insight. Alert and oriented x 3. Normal mood  Data Reviewed:  Temperature 99.6, blood pressure 143/76, pulse 83, CO2 21, creatinine 1.10 glucose 167 x-ray tibia-fibula showed bimalleolar fracture dislocation of the ankle chest x-ray showed no acute findings x-ray of the ankle 2 views left showed bimalleolar fracture dislocation of the ankle complete left ankle x-ray showed trimalleolar fracture dislocation of the ankle was posterior lateral dislocation of the talus with respect to the distal tibia CT of the ankle showed trimalleolar fracture of the left ankle and tiny loose body suggested in the tibia if talar  joint  Assessment and Plan:  #1 status post fall: Appears to be mechanical.  Patient is medically stable.  She has had a trimalleolar fracture of the left ankle.  We will admit the patient.  Will get PT OT evaluation after management of the fracture.  #2 trimalleolar fracture of the left ankle: Patient will be admitted.  Pain management.  Dr. Ernie orthopedics will follow.  Nonweightbearing.  #3 Non-insulin -dependent diabetes: Will initiate sliding scale insulin .  #4 chronic kidney disease stage III: Renal function appears to be at baseline.  Continue monitoring  #5 essential hypertension: Continue home blood pressure medications.  #6 hyperlipidemia: Continue statin  #7 generalized anxiety disorder: Continue home regimen  #8 dementia: Appears to be at baseline.    Advance Care Planning:   Code Status: Full Code   Consults: Dr. Ernie, orthopedic surgery  Family Communication: No family at bedside  Severity of Illness: The appropriate patient status for this patient is INPATIENT. Inpatient status is judged to be reasonable and necessary in order to provide the required intensity of service to ensure the patient's safety. The patient's presenting symptoms, physical exam findings, and initial radiographic and laboratory data in the context of their chronic comorbidities is felt to place them at high risk for further clinical deterioration. Furthermore, it is not anticipated that the patient will be medically stable for discharge from the hospital within 2 midnights of admission.   * I certify that at the point of admission it is my clinical judgment that the patient will require inpatient hospital care spanning beyond 2 midnights from the point of admission due to high intensity of service, high risk for further deterioration and high frequency of surveillance required.*  AuthorBETHA SIM KNOLL, MD 10/25/2023 9:02 PM  For on call review [email or web address].

## 2023-10-25 NOTE — ED Triage Notes (Signed)
 PT to ED via [facility] with CO falling at home.  PT fell and hurt her left foot and left lower leg.  Lots of swelling to the area.  PT did not lose loc but did lay on the ground for about an hour and then pushed her button. PT denies any blood thinner.

## 2023-10-25 NOTE — Progress Notes (Signed)
 Orthopedic Tech Progress Note Patient Details:  Casey [name] November 15, 1935 [id number]  Ortho Devices Type of Ortho Device: Post (short leg) splint, Stirrup splint Ortho Device/Splint Location: lle Ortho Device/Splint Interventions: Ordered, Application, Adjustment  I applied splint post reduction with drs assist. Post Interventions Patient Tolerated: Well Instructions Provided: Care of device, Adjustment of device  Chandra Dorn PARAS 10/25/2023, 8:00 PM

## 2023-10-25 NOTE — ED Provider Notes (Signed)
 [facility]MENT AT [facility] Provider Note   CSN: [id number] Arrival date & time: 10/25/23  1608     Patient presents with: Casey [name] is a 88 y.o. female.   Patient presents after isolated fall at home.  Patient denies any chest pain syncope or head injury.  Patient felt she missed her step and injured her left lower leg.  Deformity and swelling.  EMS brought patient in.  No blood thinner use.  Patient was lying on the ground for about an hour and pushed her button for help.  The history is provided by the patient and the EMS personnel.  Fall This is a new problem. Pertinent negatives include no chest pain, no abdominal pain, no headaches and no shortness of breath.       Prior to Admission medications   Medication Sig Start Date End Date Taking? Authorizing Provider  Accu-Chek Softclix Lancets lancets Use as instructed 07/26/23   Kennyth Worth HERO, MD  acetaminophen  (TYLENOL ) 500 MG tablet Take 1,000 mg by mouth every 6 (six) hours as needed.    [provider]  amLODipine  (NORVASC ) 2.5 MG tablet Take 2 tablets (5 mg total) by mouth daily. 07/29/23   Kennyth Worth HERO, MD  atorvastatin  (LIPITOR) 10 MG tablet Take 1 tablet (10 mg total) by mouth daily. 10/15/22   Kennyth Worth HERO, MD  azelastine  (ASTELIN ) 0.1 % nasal spray INSTILL 1 SPRAY INTO BOTH NOSTRILS TWICE DAILY 09/07/22   Kennyth Worth HERO, MD  azelastine  (OPTIVAR ) 0.05 % ophthalmic solution Place 2 drops into both eyes 2 (two) times daily.    [provider]  buPROPion  (WELLBUTRIN  XL) 150 MG 24 hr tablet Take 1 tablet (150 mg total) by mouth daily. 06/29/23   Kennyth Worth HERO, MD  Carboxymethylcellul-Glycerin  (REFRESH OPTIVE) 1-0.9 % GEL Place 1 drop into both eyes daily.    [provider]  empagliflozin  (JARDIANCE ) 25 MG TABS tablet Take 1 tablet (25 mg total) by mouth daily. 09/30/23   Kennyth Worth HERO, MD  FLUoxetine  (PROZAC ) 20 MG tablet Take 1 tablet (20 mg total) by  mouth daily. 03/26/23   Kennyth Worth HERO, MD  fluticasone  (FLONASE ) 50 MCG/ACT nasal spray Place 1 spray into both nostrils daily for 7 days. 08/29/22 11/17/23  Randol Simmonds, MD  furosemide  (LASIX ) 20 MG tablet Take 1 tablet (20 mg total) by mouth daily. 07/29/23   Kennyth Worth HERO, MD  gabapentin  (NEURONTIN ) 300 MG capsule Take 1 capsule (300 mg total) by mouth at bedtime. 07/29/23   Kennyth Worth HERO, MD  HYDROcodone -acetaminophen  (NORCO/VICODIN) 5-325 MG tablet Take 1 tablet by mouth 3 (three) times daily as needed for moderate pain (pain score 4-6). 09/30/23   Kennyth Worth HERO, MD  Multiple Vitamins-Minerals (CERTAVITE/ANTIOXIDANTS) TABS Take 1 tablet by mouth at bedtime. 06/02/22   Milissa Tod PARAS, MD  pantoprazole  (PROTONIX ) 40 MG tablet Take 1 tablet (40 mg total) by mouth daily. 12/25/22   Parker, Caleb M, MD  polyethylene glycol powder (GLYCOLAX /MIRALAX ) 17 GM/SCOOP powder Take 17 g by mouth daily. 06/11/22   Kennyth Worth HERO, MD  potassium chloride  SA (KLOR-CON  M) 20 MEQ tablet Take 20 mEq by mouth daily.    [provider]  QUEtiapine  (SEROQUEL ) 50 MG tablet Take 1 tablet (50 mg total) by mouth at bedtime. 09/30/23   Kennyth Worth HERO, MD  senna-docusate (SENOKOT-S) 8.6-50 MG tablet Take 1 tablet by mouth at bedtime as needed for mild constipation. 06/02/22  Milissa Tod PARAS, MD  sitaGLIPtin  (JANUVIA ) 100 MG tablet Take 1 tablet (100 mg total) by mouth daily. 07/22/23   Kennyth Worth HERO, MD    Allergies: Patient has no known allergies.    Review of Systems  Constitutional:  Negative for chills and fever.  HENT:  Negative for congestion.   Eyes:  Negative for visual disturbance.  Respiratory:  Negative for shortness of breath.   Cardiovascular:  Negative for chest pain.  Gastrointestinal:  Negative for abdominal pain and vomiting.  Genitourinary:  Negative for dysuria and flank pain.  Musculoskeletal:  Positive for gait problem and joint swelling. Negative for back pain, neck pain and neck  stiffness.  Skin:  Negative for rash.  Neurological:  Negative for light-headedness and headaches.    Updated Vital Signs BP (!) 143/76   Pulse 83   Temp 99.6 F (37.6 C) (Oral)   Resp 16   Ht 5' 2 (1.575 m)   Wt 72.6 kg   SpO2 99%   BMI 29.26 kg/m   Physical Exam Vitals and nursing note reviewed.  Constitutional:      General: She is not in acute distress.    Appearance: She is well-developed.  HENT:     Head: Normocephalic and atraumatic.     Mouth/Throat:     Mouth: Mucous membranes are moist.  Eyes:     General:        Right eye: No discharge.        Left eye: No discharge.     Conjunctiva/sclera: Conjunctivae normal.  Neck:     Trachea: No tracheal deviation.  Cardiovascular:     Rate and Rhythm: Normal rate and regular rhythm.  Pulmonary:     Effort: Pulmonary effort is normal.     Breath sounds: Normal breath sounds.  Abdominal:     General: There is no distension.     Palpations: Abdomen is soft.     Tenderness: There is no abdominal tenderness. There is no guarding.  Musculoskeletal:        General: Swelling, tenderness, deformity and signs of injury present.     Cervical back: Normal range of motion and neck supple. No rigidity.     Comments: Patient has tenderness, swelling and deformity to left lower leg and ankle.  Neurovascular intact.  Patient can flex extend toes.  Clinically dislocated.  No open wounds.  No proximal tibia tenderness.  Patient has equal strength in all extremities however decreased exam due to pain and left ankle due to deformity.  No tenderness with flexion extension of arms or right knee hip or ankle.  No midline cervical tenderness full range of motion in neck.  Skin:    General: Skin is warm.     Capillary Refill: Capillary refill takes less than 2 seconds.     Findings: No rash.  Neurological:     General: No focal deficit present.     Mental Status: She is alert.     Cranial Nerves: No cranial nerve deficit.  Psychiatric:         Mood and Affect: Mood normal.     (all labs ordered are listed, but only abnormal results are displayed) Labs Reviewed  BASIC METABOLIC PANEL WITH GFR - Abnormal; Notable for the following components:      Result Value   CO2 21 (*)    Glucose, Bld 167 (*)    Creatinine, Ser 1.10 (*)    GFR, Estimated 49 (*)  All other components within normal limits  CBC WITH DIFFERENTIAL/PLATELET - Abnormal; Notable for the following components:   Neutro Abs 7.8 (*)    All other components within normal limits  PROTIME-INR  URINALYSIS, ROUTINE W REFLEX MICROSCOPIC    EKG: EKG Interpretation Date/Time:  Monday October 25 2023 18:09:31 EDT Ventricular Rate:  90 PR Interval:  147 QRS Duration:  86 QT Interval:  483 QTC Calculation: 592 R Axis:   0  Text Interpretation: Sinus rhythm Probable left atrial enlargement Markedly posterior QRS axis Low voltage, precordial leads Consider anterior infarct Nonspecific T abnormalities, lateral leads Prolonged QT interval Confirmed by Tonia Chew [phone]) on 10/25/2023 6:46:45 PM  Radiology: CT Ankle Left Wo Contrast Result Date: 10/25/2023 CLINICAL DATA:  Postreduction complex fracture of the left ankle. EXAM: CT OF THE LEFT ANKLE WITHOUT CONTRAST TECHNIQUE: Multidetector CT imaging of the left ankle was performed according to the standard protocol. Multiplanar CT image reconstructions were also generated. RADIATION DOSE REDUCTION: This exam was performed according to the departmental dose-optimization program which includes automated exposure control, adjustment of the mA and/or kV according to patient size and/or use of iterative reconstruction technique. COMPARISON:  Left ankle and tib-fib radiographs 10/25/2023 FINDINGS: Bones/Joint/Cartilage Trimalleolar fracture of the left ankle. Oblique fracture of the distal fibular diaphysis and metadiaphysis with about 4 mm lateral displacement of the distal fracture fragment. Alignment appears near anatomic.  Several small acute ossicles demonstrated inferior to the medial malleolus consistent with avulsion fractures. Coronal fracture of the posterior malleolus with mild superior displacement of the fracture fragment resulting in about a 3 mm cortical step-off at the articular surface. No residual dislocation demonstrated at the tibiotalar joint. There is a tiny loose body demonstrated centrally in the tibiotalar joint. Mild degenerative changes in the intertarsal joints. Old appearing ossicle at the Achilles calcaneal insertion consistent with sesamoid bone. Ligaments Suboptimally assessed by CT. Muscles and Tendons No intramuscular mass or hematoma demonstrated. Soft tissues Vascular calcifications in the runoff vessels. Diffuse soft tissue swelling/edema. No loculated collections. IMPRESSION: 1. Trimalleolar fractures of the left ankle as discussed above. No significant residual dislocation. 2. Tiny loose body suggested in the tibiotalar joint. 3. Diffuse soft tissue swelling. 4. Degenerative changes in the intertarsal joints. Electronically Signed   By: Elsie Gravely M.D.   On: 10/25/2023 20:31   DG Ankle Complete Left Result Date: 10/25/2023 CLINICAL DATA:  Recent fall with ankle deformity EXAM: LEFT ANKLE COMPLETE - 3+ VIEW COMPARISON:  Film from earlier in the same day. FINDINGS: There remains evidence of a trimalleolar fracture of the distal tibia and fibula. The talus is posteriorly and laterally dislocated with respect to the distal tibia. Tarsal degenerative changes are noted. Soft tissue swelling is seen. IMPRESSION: Trimalleolar fracture dislocation of the ankle with posterolateral dislocation of the talus with respect to the distal tibia. Electronically Signed   By: Oneil Devonshire M.D.   On: 10/25/2023 19:39   DG Chest Portable 1 View Result Date: 10/25/2023 CLINICAL DATA:  fall EXAM: PORTABLE CHEST - 1 VIEW COMPARISON:  May 29, 2022, May 04, 2022 FINDINGS: Low lung volumes. No focal airspace  consolidation, pleural effusion, or pneumothorax. No cardiomegaly. Aortic atherosclerosis. No acute fracture or destructive lesions. Multilevel thoracic osteophytosis. IMPRESSION: No acute cardiopulmonary abnormality. Electronically Signed   By: Rogelia Myers M.D.   On: 10/25/2023 18:48   DG Ankle 2 Views Left Result Date: 10/25/2023 CLINICAL DATA:  fall EXAM: LEFT ANKLE - 2 VIEW COMPARISON:  None Available. FINDINGS: Tibia  and fibula Osteopenia.No acute fracture or malalignment of the proximal tibia and fibula. Mild-to-moderate tricompartmental osteoarthritis of the knee. Soft tissues are unremarkable. Ankle Osteopenia.Fracture-dislocation of the ankle. Obliquely oriented, displaced fracture of the distal fibula above the level of the tibial plafond. The distal fibular fracture fragment is displaced posteriorly approximately 5-6 mm. Posterior dislocation of the talus with respect to the tibia. A second fracture fragment along the posterior aspect of the tibia, may represent a posterior malleolus fracture with 5 mm of posterior displacement. Moderate soft tissue swelling about the ankle. IMPRESSION: 1. Bimalleolar fracture-dislocation of the ankle, as described above. 2. No acute fracture or malalignment of the proximal tibia and fibula. Electronically Signed   By: Rogelia Myers M.D.   On: 10/25/2023 18:42   DG Tibia/Fibula Left Port Result Date: 10/25/2023 CLINICAL DATA:  [id number] Fall [id number] EXAM: PORTABLE LEFT TIBIA AND FIBULA - 2 VIEW COMPARISON:  None Available. FINDINGS: Tibia and fibula Osteopenia.No acute fracture or malalignment of the proximal tibia and fibula. Mild-to-moderate tricompartmental osteoarthritis of the knee. Soft tissues are unremarkable. Ankle Osteopenia.Fracture-dislocation of the ankle. Obliquely oriented, displaced fracture of the distal fibula above the level of the tibial plafond. The distal fibular fracture fragment is displaced posteriorly approximately 5-6 mm. Posterior  dislocation of the talus with respect to the tibia. A second fracture fragment along the posterior aspect of the tibia, may represent a posterior malleolus fracture with 5 mm of posterior displacement. Moderate soft tissue swelling about the ankle. IMPRESSION: 1. Bimalleolar fracture dislocation of the ankle, as described above. 2. No acute fracture or malalignment of the proximal tibia and fibula. Electronically Signed   By: Rogelia Myers M.D.   On: 10/25/2023 18:42     .Reduction of fracture  Date/Time: 10/25/2023 9:39 PM  Performed by: Tonia Chew, MD Authorized by: Tonia Chew, MD  Consent: Verbal consent obtained. Written consent obtained Consent given by: patient Patient understanding: patient states understanding of the procedure being performed Patient consent: the patient's understanding of the procedure matches consent given Patient identity confirmed: arm band Time out: Immediately prior to procedure a time out was called to verify the correct patient, procedure, equipment, support staff and site/side marked as required. Preparation: Patient was prepped and draped in the usual sterile fashion.  Sedation: Patient sedated: yes Sedation type: moderate (conscious) sedation Sedatives: propofol  Analgesia: morphine  Sedation start date/time: 10/25/2023 7:34 PM Sedation end date/time: 10/25/2023 7:57 PM Vitals: Vital signs were monitored during sedation.  Patient tolerance: patient tolerated the procedure well with no immediate complications Comments: Reduction ankle      Medications Ordered in the ED  propofol  (DIPRIVAN ) 10 mg/mL bolus/IV push 36.3 mg (36.3 mg Intravenous Not Given 10/25/23 1958)  morphine  (PF) 2 MG/ML injection 2 mg (2 mg Intravenous Given 10/25/23 1755)  morphine  (PF) 2 MG/ML injection 2 mg (2 mg Intravenous Given 10/25/23 1938)  propofol  (DIPRIVAN ) 10 mg/mL bolus/IV push (20 mg Intravenous Given 10/25/23 1951)                                     Medical Decision Making Amount and/or Complexity of Data Reviewed Labs: ordered. Radiology: ordered. ECG/medicine tests: ordered.  Risk Prescription drug management. Decision regarding hospitalization.   Patient presents with isolated mechanical fall and left ankle dislocation and fracture clinically.  Patient has no other evidence of significant injury on examination.  Blood work independently reviewed overall reassuring no significant anemia  normal white blood cell count Laxose unremarkable.  Urinalysis pending.  Discussed in detail plan for reduction especially given to what skin on medial malleolus.  This is an urgent/emergent procedure, patient agreed with plan.  Reduction performed by resident physician with my guidance, procedural sedation performed by myself without difficulty using propofol  for after morphine  was given.  Patient tolerated procedure well and improved alignment significantly.  Technician placed splints.  Discussed with orthopedics Dr. Emmit plan for CT scan and consult tomorrow morning to arrange surgery.  Discussed with hospitalist for admission.     Final diagnoses:  Fall, initial encounter  Ankle dislocation, left, initial encounter  Closed left ankle fracture, initial encounter    ED Discharge Orders     None          Tonia Chew, MD 10/25/23 2141

## 2023-10-25 NOTE — Progress Notes (Signed)
 RT responded to a conscious sedation, placed pt on ETCO2 cannula for monitoring.

## 2023-10-25 NOTE — ED Notes (Signed)
 Messaged Dr. Zavitz to see if we need to do a reduction.

## 2023-10-26 ENCOUNTER — Inpatient Hospital Stay (HOSPITAL_COMMUNITY): Admitting: Certified Registered Nurse Anesthetist

## 2023-10-26 ENCOUNTER — Encounter (HOSPITAL_COMMUNITY)
Admission: EM | Disposition: A | Discharge status: Skilled Nursing Facility | Payer: Self-pay | Source: Home / Self Care | Attending: Internal Medicine

## 2023-10-26 ENCOUNTER — Encounter (HOSPITAL_COMMUNITY): Payer: Self-pay | Admitting: Internal Medicine

## 2023-10-26 ENCOUNTER — Inpatient Hospital Stay (HOSPITAL_COMMUNITY)

## 2023-10-26 DIAGNOSIS — N1831 Chronic kidney disease, stage 3a: Secondary | ICD-10-CM

## 2023-10-26 DIAGNOSIS — S82852A Displaced trimalleolar fracture of left lower leg, initial encounter for closed fracture: Secondary | ICD-10-CM | POA: Diagnosis not present

## 2023-10-26 DIAGNOSIS — E1122 Type 2 diabetes mellitus with diabetic chronic kidney disease: Secondary | ICD-10-CM

## 2023-10-26 DIAGNOSIS — I129 Hypertensive chronic kidney disease with stage 1 through stage 4 chronic kidney disease, or unspecified chronic kidney disease: Secondary | ICD-10-CM | POA: Diagnosis not present

## 2023-10-26 HISTORY — PX: ORIF ANKLE FRACTURE: SHX5408

## 2023-10-26 LAB — COMPREHENSIVE METABOLIC PANEL WITH GFR
ALT: 12 U/L (ref 0–44)
AST: 17 U/L (ref 15–41)
Albumin: 3.8 g/dL (ref 3.5–5.0)
Alkaline Phosphatase: 93 U/L (ref 38–126)
Anion gap: 14 (ref 5–15)
BUN: 12 mg/dL (ref 8–23)
CO2: 21 mmol/L — ABNORMAL LOW (ref 22–32)
Calcium: 9.6 mg/dL (ref 8.9–10.3)
Chloride: 105 mmol/L (ref 98–111)
Creatinine, Ser: 1.11 mg/dL — ABNORMAL HIGH (ref 0.44–1.00)
GFR, Estimated: 48 mL/min — ABNORMAL LOW (ref >60)
Glucose, Bld: 124 mg/dL — ABNORMAL HIGH (ref 70–99)
Potassium: 4.1 mmol/L (ref 3.5–5.1)
Sodium: 140 mmol/L (ref 135–145)
Total Bilirubin: 1 mg/dL (ref 0.0–1.2)
Total Protein: 7.4 g/dL (ref 6.5–8.1)

## 2023-10-26 LAB — SURGICAL PCR SCREEN
MRSA, PCR: NEGATIVE
Staphylococcus aureus: NEGATIVE

## 2023-10-26 LAB — CBC
HCT: 35.7 % — ABNORMAL LOW (ref 36.0–46.0)
Hemoglobin: 11.1 g/dL — ABNORMAL LOW (ref 12.0–15.0)
MCH: 30.7 pg (ref 26.0–34.0)
MCHC: 31.1 g/dL (ref 30.0–36.0)
MCV: 98.6 fL (ref 80.0–100.0)
Platelets: 250 K/uL (ref 150–400)
RBC: 3.62 MIL/uL — ABNORMAL LOW (ref 3.87–5.11)
RDW: 15.8 % — ABNORMAL HIGH (ref 11.5–15.5)
WBC: 11.2 K/uL — ABNORMAL HIGH (ref 4.0–10.5)
nRBC: 0.2 % (ref 0.0–0.2)

## 2023-10-26 LAB — URINALYSIS, ROUTINE W REFLEX MICROSCOPIC
Bilirubin Urine: NEGATIVE
Glucose, UA: >=500 mg/dL — AB
Hgb urine dipstick: NEGATIVE
Ketones, ur: 20 mg/dL — AB
Leukocytes,Ua: NEGATIVE
Nitrite: NEGATIVE
Protein, ur: 30 mg/dL — AB
Specific Gravity, Urine: 1.021 (ref 1.005–1.030)
pH: 7 (ref 5.0–8.0)

## 2023-10-26 LAB — GLUCOSE, CAPILLARY
Glucose-Capillary: 115 mg/dL — ABNORMAL HIGH (ref 70–99)
Glucose-Capillary: 136 mg/dL — ABNORMAL HIGH (ref 70–99)
Glucose-Capillary: 184 mg/dL — ABNORMAL HIGH (ref 70–99)
Glucose-Capillary: 151 mg/dL — ABNORMAL HIGH (ref 70–99)

## 2023-10-26 SURGERY — OPEN REDUCTION INTERNAL FIXATION (ORIF) ANKLE FRACTURE
Anesthesia: General | Site: Ankle | Laterality: Left

## 2023-10-26 MED ORDER — LACTATED RINGERS IV SOLN: INTRAVENOUS | Status: AC

## 2023-10-26 MED ORDER — INSULIN ASPART 100 UNIT/ML IJ SOLN
0.0000 [IU] | Freq: Three times a day (TID) | INTRAMUSCULAR | Status: DC
  Administered 2023-10-26: 3 [IU] via SUBCUTANEOUS
  Administered 2023-10-27: 2 [IU] via SUBCUTANEOUS
  Administered 2023-10-27 – 2023-10-28 (×4): 3 [IU] via SUBCUTANEOUS
  Administered 2023-10-29: 2 [IU] via SUBCUTANEOUS

## 2023-10-26 MED ORDER — HYDRALAZINE HCL 20 MG/ML IJ SOLN: 10.0000 mg | Freq: Four times a day (QID) | INTRAMUSCULAR | Status: DC | PRN

## 2023-10-26 MED ORDER — GABAPENTIN 300 MG PO CAPS
300.0000 mg | ORAL_CAPSULE | Freq: Every day | ORAL | Status: DC
  Administered 2023-10-26 – 2023-10-28 (×3): 300 mg via ORAL
  Filled 2023-10-26 (×3): qty 1

## 2023-10-26 MED ORDER — CEFAZOLIN SODIUM 1 G IJ SOLR
INTRAMUSCULAR | Status: AC
  Filled 2023-10-26: qty 20

## 2023-10-26 MED ORDER — ACETAMINOPHEN 325 MG PO TABS: 650.0000 mg | ORAL_TABLET | Freq: Four times a day (QID) | ORAL | Status: DC

## 2023-10-26 MED ORDER — ENOXAPARIN SODIUM 40 MG/0.4ML IJ SOSY
40.0000 mg | PREFILLED_SYRINGE | Freq: Every day | INTRAMUSCULAR | Status: DC
  Administered 2023-10-27: 40 mg via SUBCUTANEOUS
  Filled 2023-10-26: qty 0.4

## 2023-10-26 MED ORDER — POVIDONE-IODINE 10 % EX SWAB
2.0000 | Freq: Once | CUTANEOUS | Status: DC
Start: 2023-10-26 — End: 2023-10-26

## 2023-10-26 MED ORDER — DEXAMETHASONE SODIUM PHOSPHATE 10 MG/ML IJ SOLN: INTRAMUSCULAR | Status: DC | PRN

## 2023-10-26 MED ORDER — QUETIAPINE FUMARATE 25 MG PO TABS
50.0000 mg | ORAL_TABLET | Freq: Every day | ORAL | Status: DC
  Administered 2023-10-26 – 2023-10-28 (×3): 50 mg via ORAL
  Filled 2023-10-26 (×3): qty 2

## 2023-10-26 MED ORDER — PROPOFOL 10 MG/ML IV BOLUS
INTRAVENOUS | Status: AC
  Filled 2023-10-26: qty 20

## 2023-10-26 MED ORDER — ACETAMINOPHEN 325 MG PO TABS: 650.0000 mg | ORAL_TABLET | Freq: Four times a day (QID) | ORAL | Status: DC | PRN

## 2023-10-26 MED ORDER — LIDOCAINE 2% (20 MG/ML) 5 ML SYRINGE
INTRAMUSCULAR | Status: DC | PRN
  Administered 2023-10-26: 40 mg via INTRAVENOUS

## 2023-10-26 MED ORDER — CHLORHEXIDINE GLUCONATE 4 % EX SOLN
60.0000 mL | Freq: Once | CUTANEOUS | Status: DC
Start: 2023-10-26 — End: 2023-10-26

## 2023-10-26 MED ORDER — OXYCODONE HCL 5 MG PO TABS
5.0000 mg | ORAL_TABLET | Freq: Four times a day (QID) | ORAL | Status: DC | PRN
Start: 2023-10-26 — End: 2023-10-26

## 2023-10-26 MED ORDER — FENTANYL CITRATE (PF) 250 MCG/5ML IJ SOLN
INTRAMUSCULAR | Status: DC | PRN
  Administered 2023-10-26 (×3): 25 ug via INTRAVENOUS

## 2023-10-26 MED ORDER — FENTANYL CITRATE (PF) 100 MCG/2ML IJ SOLN: 25.0000 ug | Freq: Once | INTRAMUSCULAR | Status: AC

## 2023-10-26 MED ORDER — LINAGLIPTIN 5 MG PO TABS
5.0000 mg | ORAL_TABLET | Freq: Every day | ORAL | Status: DC
  Administered 2023-10-26 – 2023-10-29 (×4): 5 mg via ORAL
  Filled 2023-10-26 (×4): qty 1

## 2023-10-26 MED ORDER — INSULIN ASPART 100 UNIT/ML IJ SOLN: 0.0000 [IU] | INTRAMUSCULAR | Status: DC | PRN

## 2023-10-26 MED ORDER — CHLORHEXIDINE GLUCONATE 0.12 % MT SOLN
15.0000 mL | Freq: Once | OROMUCOSAL | Status: AC
  Administered 2023-10-26: 15 mL via OROMUCOSAL
  Filled 2023-10-26: qty 15

## 2023-10-26 MED ORDER — OXYCODONE HCL 5 MG PO TABS
2.5000 mg | ORAL_TABLET | ORAL | Status: DC | PRN
  Administered 2023-10-27: 2.5 mg via ORAL
  Filled 2023-10-26 (×2): qty 1

## 2023-10-26 MED ORDER — INSULIN ASPART 100 UNIT/ML IJ SOLN: 0.0000 [IU] | Freq: Every day | INTRAMUSCULAR | Status: DC

## 2023-10-26 MED ORDER — MORPHINE SULFATE (PF) 2 MG/ML IV SOLN
2.0000 mg | INTRAVENOUS | Status: DC | PRN
  Administered 2023-10-28: 2 mg via INTRAVENOUS
  Filled 2023-10-26: qty 1

## 2023-10-26 MED ORDER — FENTANYL CITRATE (PF) 250 MCG/5ML IJ SOLN
INTRAMUSCULAR | Status: AC
  Filled 2023-10-26: qty 5

## 2023-10-26 MED ORDER — PROPOFOL 10 MG/ML IV BOLUS
INTRAVENOUS | Status: DC | PRN
  Administered 2023-10-26: 150 mg via INTRAVENOUS
  Administered 2023-10-26: 30 mg via INTRAVENOUS
  Administered 2023-10-26: 20 mg via INTRAVENOUS

## 2023-10-26 MED ORDER — AMLODIPINE BESYLATE 5 MG PO TABS
5.0000 mg | ORAL_TABLET | Freq: Every day | ORAL | Status: DC
  Administered 2023-10-27 – 2023-10-29 (×3): 5 mg via ORAL
  Filled 2023-10-26 (×3): qty 1

## 2023-10-26 MED ORDER — PANTOPRAZOLE SODIUM 40 MG PO TBEC
40.0000 mg | DELAYED_RELEASE_TABLET | Freq: Every day | ORAL | Status: DC
  Administered 2023-10-26 – 2023-10-29 (×4): 40 mg via ORAL
  Filled 2023-10-26 (×4): qty 1

## 2023-10-26 MED ORDER — ATORVASTATIN CALCIUM 10 MG PO TABS
10.0000 mg | ORAL_TABLET | Freq: Every day | ORAL | Status: DC
  Administered 2023-10-26 – 2023-10-29 (×4): 10 mg via ORAL
  Filled 2023-10-26 (×4): qty 1

## 2023-10-26 MED ORDER — ORAL CARE MOUTH RINSE: 15.0000 mL | Freq: Once | OROMUCOSAL | Status: AC

## 2023-10-26 MED ORDER — EMPAGLIFLOZIN 25 MG PO TABS
25.0000 mg | ORAL_TABLET | Freq: Every day | ORAL | Status: DC
  Filled 2023-10-26: qty 1

## 2023-10-26 MED ORDER — MIDAZOLAM HCL 2 MG/2ML IJ SOLN: 1.0000 mg | Freq: Once | INTRAMUSCULAR | Status: AC

## 2023-10-26 MED ORDER — BUPROPION HCL ER (XL) 150 MG PO TB24
150.0000 mg | ORAL_TABLET | Freq: Every day | ORAL | Status: DC
  Administered 2023-10-26 – 2023-10-29 (×4): 150 mg via ORAL
  Filled 2023-10-26 (×4): qty 1

## 2023-10-26 MED ORDER — ONDANSETRON HCL 4 MG/2ML IJ SOLN
INTRAMUSCULAR | Status: DC | PRN
  Administered 2023-10-26: 4 mg via INTRAVENOUS

## 2023-10-26 MED ORDER — CEFAZOLIN SODIUM-DEXTROSE 2-4 GM/100ML-% IV SOLN
2.0000 g | Freq: Three times a day (TID) | INTRAVENOUS | Status: AC
  Administered 2023-10-26 – 2023-10-27 (×3): 2 g via INTRAVENOUS
  Filled 2023-10-26 (×3): qty 100

## 2023-10-26 MED ORDER — FENTANYL CITRATE (PF) 100 MCG/2ML IJ SOLN
INTRAMUSCULAR | Status: AC
  Administered 2023-10-26: 25 ug via INTRAVENOUS
  Filled 2023-10-26: qty 2

## 2023-10-26 MED ORDER — FLUOXETINE HCL 20 MG PO CAPS
20.0000 mg | ORAL_CAPSULE | Freq: Every day | ORAL | Status: DC
  Administered 2023-10-26 – 2023-10-29 (×4): 20 mg via ORAL
  Filled 2023-10-26 (×5): qty 1

## 2023-10-26 MED ORDER — ACETAMINOPHEN 500 MG PO TABS
1000.0000 mg | ORAL_TABLET | Freq: Three times a day (TID) | ORAL | Status: AC
  Administered 2023-10-26 – 2023-10-28 (×7): 1000 mg via ORAL
  Filled 2023-10-26 (×7): qty 2

## 2023-10-26 MED ORDER — LACTATED RINGERS IV SOLN: INTRAVENOUS | Status: DC

## 2023-10-26 MED ORDER — CEFAZOLIN SODIUM-DEXTROSE 2-4 GM/100ML-% IV SOLN
2.0000 g | INTRAVENOUS | Status: AC
  Administered 2023-10-26: 2 g via INTRAVENOUS

## 2023-10-26 MED ORDER — OXYCODONE HCL 5 MG PO TABS
5.0000 mg | ORAL_TABLET | ORAL | Status: DC | PRN
  Administered 2023-10-28 – 2023-10-29 (×3): 5 mg via ORAL
  Filled 2023-10-26 (×4): qty 1

## 2023-10-26 MED ORDER — BUPIVACAINE-EPINEPHRINE (PF) 0.5% -1:200000 IJ SOLN
INTRAMUSCULAR | Status: DC | PRN
  Administered 2023-10-26: 30 mL via PERINEURAL

## 2023-10-26 MED ORDER — MIDAZOLAM HCL 2 MG/2ML IJ SOLN
INTRAMUSCULAR | Status: AC
  Administered 2023-10-26: 1 mg via INTRAVENOUS
  Filled 2023-10-26: qty 2

## 2023-10-26 SURGICAL SUPPLY — 70 items
BAG COUNTER SPONGE SURGICOUNT (BAG) ×1 IMPLANT
BANDAGE ESMARK 6X9 LF (GAUZE/BANDAGES/DRESSINGS) ×1 IMPLANT
BIT DRILL CANN 2.7X625 NONSTRL (BIT) IMPLANT
BIT DRILL LCP QC 2X140 (BIT) IMPLANT
BIT DRILL LONG 2.7 (BIT) IMPLANT
BIT DRILL X LONG 2.5 (BIT) IMPLANT
BNDG ELASTIC 4INX 5YD STR LF (GAUZE/BANDAGES/DRESSINGS) IMPLANT
BNDG ELASTIC 4X5.8 VLCR STR LF (GAUZE/BANDAGES/DRESSINGS) IMPLANT
BNDG ELASTIC 6INX 5YD STR LF (GAUZE/BANDAGES/DRESSINGS) IMPLANT
BNDG GAUZE DERMACEA FLUFF 4 (GAUZE/BANDAGES/DRESSINGS) ×2 IMPLANT
BRUSH SCRUB EZ 4% CHG (MISCELLANEOUS) ×1 IMPLANT
BRUSH SCRUB EZ PLAIN DRY (MISCELLANEOUS) ×2 IMPLANT
COVER MAYO STAND STRL (DRAPES) ×1 IMPLANT
COVER SURGICAL LIGHT HANDLE (MISCELLANEOUS) ×1 IMPLANT
CUFF TRNQT CYL 34X4.125X (TOURNIQUET CUFF) ×1 IMPLANT
DRAPE C-ARM 42X72 X-RAY (DRAPES) ×1 IMPLANT
DRAPE C-ARMOR (DRAPES) ×1 IMPLANT
DRAPE HALF SHEET 40X57 (DRAPES) ×1 IMPLANT
DRAPE U-SHAPE 47X51 STRL (DRAPES) ×1 IMPLANT
DRSG EMULSION OIL 3X3 NADH (GAUZE/BANDAGES/DRESSINGS) IMPLANT
DRSG MEPITEL 8X12 (GAUZE/BANDAGES/DRESSINGS) IMPLANT
ELECTRODE REM PT RTRN 9FT ADLT (ELECTROSURGICAL) ×1 IMPLANT
GAUZE PAD ABD 8X10 STRL (GAUZE/BANDAGES/DRESSINGS) IMPLANT
GAUZE SPONGE 4X4 12PLY STRL (GAUZE/BANDAGES/DRESSINGS) ×2 IMPLANT
GLOVE BIO SURGEON STRL SZ8 (GLOVE) ×2 IMPLANT
GLOVE BIOGEL PI IND STRL 7.5 (GLOVE) ×1 IMPLANT
GLOVE BIOGEL PI IND STRL 8 (GLOVE) ×1 IMPLANT
GLOVE SURG ORTHO LTX SZ7.5 (GLOVE) ×2 IMPLANT
GOWN STRL REUS W/ TWL LRG LVL3 (GOWN DISPOSABLE) ×2 IMPLANT
GOWN STRL REUS W/ TWL XL LVL3 (GOWN DISPOSABLE) ×1 IMPLANT
GUIDEWIRE THREADED 150MM (WIRE) IMPLANT
KIT BASIN OR (CUSTOM PROCEDURE TRAY) ×1 IMPLANT
KIT TURNOVER KIT B (KITS) ×1 IMPLANT
KWIRE 1.6X150 (WIRE) IMPLANT
MANIFOLD NEPTUNE II (INSTRUMENTS) ×1 IMPLANT
NDL HYPO 21X1.5 SAFETY (NEEDLE) IMPLANT
NEEDLE HYPO 21X1.5 SAFETY (NEEDLE) IMPLANT
PACK GENERAL/GYN (CUSTOM PROCEDURE TRAY) ×1 IMPLANT
PACK ORTHO EXTREMITY (CUSTOM PROCEDURE TRAY) ×1 IMPLANT
PAD ARMBOARD POSITIONER FOAM (MISCELLANEOUS) ×2 IMPLANT
PAD CAST 4YDX4 CTTN HI CHSV (CAST SUPPLIES) IMPLANT
PADDING CAST COTTON 6X4 STRL (CAST SUPPLIES) IMPLANT
PLATE 5H 2.7 LCK LAT DIST FIB (Plate) IMPLANT
SCREW BONE CORTEX 3.5 46MM (Screw) IMPLANT
SCREW CORT ST T8 SD REC 2.7X20 (Screw) IMPLANT
SCREW CORTEX 2.7 SLF-TPNG 16MM (Screw) IMPLANT
SCREW LOCK STARDRIVE 3.5X52 (Screw) IMPLANT
SCREW LOCK VA ST 2.7X10 (Screw) IMPLANT
SCREW LOCK VA ST 2.7X14 (Screw) IMPLANT
SCREW LOCK VA ST 2.7X18 (Screw) IMPLANT
SCREW LOCK VA ST 2.7X20 (Screw) IMPLANT
SCREW SELF TAP 12M (Screw) IMPLANT
SCREW SELF TAP 14MM (Screw) IMPLANT
SCREW SHORT THREAD 4.0X40 (Screw) IMPLANT
SCREW SHORT THREAD 4.0X42 (Screw) IMPLANT
SOLN 0.9% NACL 1000 ML (IV SOLUTION) ×1 IMPLANT
SOLN 0.9% NACL POUR BTL 1000ML (IV SOLUTION) ×1 IMPLANT
SOLN STERILE WATER 1000 ML (IV SOLUTION) ×1 IMPLANT
SOLN STERILE WATER BTL 1000 ML (IV SOLUTION) ×1 IMPLANT
SPLINT PLASTER CAST XFAST 5X30 (CAST SUPPLIES) IMPLANT
SPONGE T-LAP 18X18 ~~LOC~~+RFID (SPONGE) ×1 IMPLANT
STAPLER SKIN PROX 35W (STAPLE) IMPLANT
SUCTION TUBE FRAZIER 10FR DISP (SUCTIONS) ×1 IMPLANT
SUT ETHILON 2 0 FS 18 (SUTURE) ×2 IMPLANT
SUT PDS AB 2-0 CT1 27 (SUTURE) IMPLANT
SUT VIC AB 2-0 CT1 TAPERPNT 27 (SUTURE) ×2 IMPLANT
TOWEL GREEN STERILE (TOWEL DISPOSABLE) ×2 IMPLANT
TOWEL GREEN STERILE FF (TOWEL DISPOSABLE) ×1 IMPLANT
TUBE CONNECTING 12X1/4 (SUCTIONS) ×1 IMPLANT
UNDERPAD 30X36 HEAVY ABSORB (UNDERPADS AND DIAPERS) ×1 IMPLANT

## 2023-10-26 NOTE — Progress Notes (Signed)
 PROGRESS NOTE    Casey [name]  FMW:[id number] DOB: [birth year] DOA: 10/25/2023 PCP: Kennyth Worth HERO, MD  Chief Complaint  Patient presents with   Fall    Brief Narrative:   Casey [name] is Casey [name] 88 y.o. female with medical history significant of essential hypertension, type 2 diabetes, hyperlipidemia, generalized anxiety disorder, dementia, depression, chronic kidney disease stage IIIa, who sustained Shaena Parkerson mechanical fall at home and twisted her ankle.  Patient came in with severe pain and swelling of the left ankle.  Did not hit her head.  Patient did not have any past history of fractures.  She was seen and evaluated in the ER.  X-rays and later CT of the ankle showed trimalleolar fracture of the left ankle.  This was reduced in the ER.  Orthopedics consulted and patient being admitted to the hospital for orthopedics to follow and treat.    Now s/p surgery with orthopedics.  Assessment & Plan:   Principal Problem:   Left trimalleolar fracture, closed, initial encounter Active Problems:   Dyslipidemia associated with type 2 diabetes mellitus (HCC)   GAD (generalized anxiety disorder)   Hypertension associated with diabetes (HCC)   Type 2 diabetes mellitus with microalbuminuria, without long-term current use of insulin  (HCC)   Dementia without behavioral disturbance (HCC)   Depression, major, single episode, moderate (HCC)   CKD stage 3a, GFR 45-59 ml/min (HCC)   Fall  # Trimalleolar Fracture of the L Ankle Now s/p  ORIF of L trimalleolar ankle fracture with fixation of posterior lip, ORIF of syndesmosis, manual application of stress ankle syndesmosis under fluorosocopy Post op care per orthopedics, NWB in splint with ice/elevation - follow in office in 10-14 days for removal of sutures and transition to cam boot.  Weightbearing at 8 weeks.  Note high risk of nonunion and loss of reduction given diabetes and fracture/dislocation pattern of injury.  #1 status post fall: suspected  mechanical fall.  PT/OT post op.  She'll need rehab.   #3 T2DM  Peripheral Neuropathy: last A1c 7.3 - resume januvia  (pharmacy substitute), jardiance .  SSI.  Adjust as needed.  Home gabapentin .   # HTN Resume amlodipine   Hold lasix  for now  #4 chronic kidney disease stage III: Renal function appears to be at baseline.  Continue monitoring   #6 hyperlipidemia: Continue statin   #7 Depression  Anxiety: prozac , wellbutrin , seroquel     #8 dementia: Appears to be at baseline.     DVT prophylaxis: lovenox  Code Status: full Family Communication: none Disposition:   Status is: Inpatient Remains inpatient appropriate because: need for continued inpatient care   Consultants:  orthopedics  Procedures:  10/7 PROCEDURES:   OPEN REDUCTION INTERNAL FIXATION OF LEFT TRIMALLEOLAR ANKLE FRACTURE WITH FIXATION OF THE POSTERIOR LIP OPEN REDUCTION INTERNAL FIXATION OF THE SYNDESMOSIS MANUAL APPLICATION OF STRESS ANKLE SYNDESMOSIS UNDER FLUOROSCOPY  Antimicrobials:  Anti-infectives (From admission, onward)    Start     Dose/Rate Route Frequency Ordered Stop   10/26/23 1800  ceFAZolin  (ANCEF ) IVPB 2g/100 mL premix        2 g 200 mL/hr over 30 Minutes Intravenous Every 8 hours 10/26/23 1235 10/27/23 2159   10/26/23 0915  ceFAZolin  (ANCEF ) IVPB 2g/100 mL premix        2 g 200 mL/hr over 30 Minutes Intravenous On call to O.R. 10/26/23 0913 10/26/23 0959       Subjective: No pain right now  Objective: Vitals:   10/26/23 1200 10/26/23 1215 10/26/23 1240 10/26/23 1444  BP: (!) 140/60 138/69 133/60 135/64  Pulse: 75 73 72 78  Resp: 17 13 17 18   Temp:  98 F (36.7 C) 97.7 F (36.5 C) 98.3 F (36.8 C)  TempSrc:      SpO2: 96% 98% 97% 99%  Weight:      Height:        Intake/Output Summary (Last 24 hours) at 10/26/2023 1601 Last data filed at 10/26/2023 1141 Gross per 24 hour  Intake 900 ml  Output 5 ml  Net 895 ml   Filed Weights   10/25/23 1618  Weight: 72.6 kg     Examination:  General exam: Appears calm and comfortable  Respiratory system: unlabored Cardiovascular system: RRR Central nervous system: Alert and oriented. No focal neurological deficits. Extremities: splint to LLE   Data Reviewed: I have personally reviewed following labs and imaging studies  CBC: Recent Labs  Lab 10/25/23 1758 10/26/23 0530  WBC 9.9 11.2*  NEUTROABS 7.8*  --   HGB 13.4 11.1*  HCT 41.6 35.7*  MCV 87.9 98.6  PLT 234 250    Basic Metabolic Panel: Recent Labs  Lab 10/25/23 1758 10/26/23 0734  NA 138 140  K 4.2 4.1  CL 102 105  CO2 21* 21*  GLUCOSE 167* 124*  BUN 11 12  CREATININE 1.10* 1.11*  CALCIUM  9.8 9.6    GFR: Estimated Creatinine Clearance: 33.9 mL/min (Azyah Flett) (by C-G formula based on SCr of 1.11 mg/dL (H)).  Liver Function Tests: Recent Labs  Lab 10/26/23 0734  AST 17  ALT 12  ALKPHOS 93  BILITOT 1.0  PROT 7.4  ALBUMIN 3.8    CBG: Recent Labs  Lab 10/26/23 0832 10/26/23 1141  GLUCAP 115* 136*     Recent Results (from the past 240 hours)  Surgical pcr screen     Status: None   Collection Time: 10/26/23  9:13 AM   Specimen: Nasal Mucosa; Nasal Swab  Result Value Ref Range Status   MRSA, PCR NEGATIVE NEGATIVE Final   Staphylococcus aureus NEGATIVE NEGATIVE Final    Comment: (NOTE) The Xpert SA Assay (FDA approved for NASAL specimens in patients 64 years of age and older), is one component of Chaitanya Amedee comprehensive surveillance program. It is not intended to diagnose infection nor to guide or monitor treatment. Performed at [facility], [address] [place], [place] [address]          Radiology Studies: DG Ankle Complete Left Result Date: 10/26/2023 CLINICAL DATA:  Fracture, postop. EXAM: LEFT ANKLE COMPLETE - 3+ VIEW COMPARISON:  Preoperative imaging FINDINGS: Lateral plate and screw fixation of distal fibular fracture with 2 syndesmotic screws. Screw traverses the distal tibial fracture. Improved  fracture alignment. Ankle mortise is congruent. Overlying splint material limits osseous and soft tissue fine detail. IMPRESSION: ORIF of distal fibular and tibial fractures. Electronically Signed   By: Andrea Gasman M.D.   On: 10/26/2023 12:49   DG Ankle 2 Views Left Result Date: 10/26/2023 CLINICAL DATA:  Elective surgery. EXAM: LEFT ANKLE - 2 VIEW COMPARISON:  Radiograph yesterday FINDINGS: Four fluoroscopic spot views of the left ankle submitted from the operating room. Plate and screw fixation of distal fibular fracture with 2 syndesmotic screws. Screw traverses the distal tibia. Fluoroscopy time 43.3 seconds. Dose 1.02 mGy. IMPRESSION: Intraoperative fluoroscopy during left ankle ORIF. Electronically Signed   By: Andrea Gasman M.D.   On: 10/26/2023 12:24   DG C-Arm 1-60 Min-No Report Result Date: 10/26/2023 Fluoroscopy was utilized by the requesting  physician.  No radiographic interpretation.   CT Ankle Left Wo Contrast Result Date: 10/25/2023 CLINICAL DATA:  Postreduction complex fracture of the left ankle. EXAM: CT OF THE LEFT ANKLE WITHOUT CONTRAST TECHNIQUE: Multidetector CT imaging of the left ankle was performed according to the standard protocol. Multiplanar CT image reconstructions were also generated. RADIATION DOSE REDUCTION: This exam was performed according to the departmental dose-optimization program which includes automated exposure control, adjustment of the mA and/or kV according to patient size and/or use of iterative reconstruction technique. COMPARISON:  Left ankle and tib-fib radiographs 10/25/2023 FINDINGS: Bones/Joint/Cartilage Trimalleolar fracture of the left ankle. Oblique fracture of the distal fibular diaphysis and metadiaphysis with about 4 mm lateral displacement of the distal fracture fragment. Alignment appears near anatomic. Several small acute ossicles demonstrated inferior to the medial malleolus consistent with avulsion fractures. Coronal fracture of the  posterior malleolus with mild superior displacement of the fracture fragment resulting in about Tuyet Bader 3 mm cortical step-off at the articular surface. No residual dislocation demonstrated at the tibiotalar joint. There is Arma Reining tiny loose body demonstrated centrally in the tibiotalar joint. Mild degenerative changes in the intertarsal joints. Old appearing ossicle at the Achilles calcaneal insertion consistent with sesamoid bone. Ligaments Suboptimally assessed by CT. Muscles and Tendons No intramuscular mass or hematoma demonstrated. Soft tissues Vascular calcifications in the runoff vessels. Diffuse soft tissue swelling/edema. No loculated collections. IMPRESSION: 1. Trimalleolar fractures of the left ankle as discussed above. No significant residual dislocation. 2. Tiny loose body suggested in the tibiotalar joint. 3. Diffuse soft tissue swelling. 4. Degenerative changes in the intertarsal joints. Electronically Signed   By: Elsie Gravely M.D.   On: 10/25/2023 20:31   DG Ankle Complete Left Result Date: 10/25/2023 CLINICAL DATA:  Recent fall with ankle deformity EXAM: LEFT ANKLE COMPLETE - 3+ VIEW COMPARISON:  Film from earlier in the same day. FINDINGS: There remains evidence of Chanise Habeck trimalleolar fracture of the distal tibia and fibula. The talus is posteriorly and laterally dislocated with respect to the distal tibia. Tarsal degenerative changes are noted. Soft tissue swelling is seen. IMPRESSION: Trimalleolar fracture dislocation of the ankle with posterolateral dislocation of the talus with respect to the distal tibia. Electronically Signed   By: Oneil Devonshire M.D.   On: 10/25/2023 19:39   DG Chest Portable 1 View Result Date: 10/25/2023 CLINICAL DATA:  fall EXAM: PORTABLE CHEST - 1 VIEW COMPARISON:  May 29, 2022, May 04, 2022 FINDINGS: Low lung volumes. No focal airspace consolidation, pleural effusion, or pneumothorax. No cardiomegaly. Aortic atherosclerosis. No acute fracture or destructive lesions.  Multilevel thoracic osteophytosis. IMPRESSION: No acute cardiopulmonary abnormality. Electronically Signed   By: Rogelia Myers M.D.   On: 10/25/2023 18:48   DG Ankle 2 Views Left Result Date: 10/25/2023 CLINICAL DATA:  fall EXAM: LEFT ANKLE - 2 VIEW COMPARISON:  None Available. FINDINGS: Tibia and fibula Osteopenia.No acute fracture or malalignment of the proximal tibia and fibula. Mild-to-moderate tricompartmental osteoarthritis of the knee. Soft tissues are unremarkable. Ankle Osteopenia.Fracture-dislocation of the ankle. Obliquely oriented, displaced fracture of the distal fibula above the level of the tibial plafond. The distal fibular fracture fragment is displaced posteriorly approximately 5-6 mm. Posterior dislocation of the talus with respect to the tibia. Alyson Ki second fracture fragment along the posterior aspect of the tibia, may represent Nkechi Linehan posterior malleolus fracture with 5 mm of posterior displacement. Moderate soft tissue swelling about the ankle. IMPRESSION: 1. Bimalleolar fracture-dislocation of the ankle, as described above. 2. No acute fracture or malalignment  of the proximal tibia and fibula. Electronically Signed   By: Rogelia Myers M.D.   On: 10/25/2023 18:42   DG Tibia/Fibula Left Port Result Date: 10/25/2023 CLINICAL DATA:  [id number] Fall [id number] EXAM: PORTABLE LEFT TIBIA AND FIBULA - 2 VIEW COMPARISON:  None Available. FINDINGS: Tibia and fibula Osteopenia.No acute fracture or malalignment of the proximal tibia and fibula. Mild-to-moderate tricompartmental osteoarthritis of the knee. Soft tissues are unremarkable. Ankle Osteopenia.Fracture-dislocation of the ankle. Obliquely oriented, displaced fracture of the distal fibula above the level of the tibial plafond. The distal fibular fracture fragment is displaced posteriorly approximately 5-6 mm. Posterior dislocation of the talus with respect to the tibia. Carri Spillers second fracture fragment along the posterior aspect of the tibia, may represent Tarea Skillman  posterior malleolus fracture with 5 mm of posterior displacement. Moderate soft tissue swelling about the ankle. IMPRESSION: 1. Bimalleolar fracture dislocation of the ankle, as described above. 2. No acute fracture or malalignment of the proximal tibia and fibula. Electronically Signed   By: Rogelia Myers M.D.   On: 10/25/2023 18:42        Scheduled Meds:  acetaminophen   650 mg Oral Q6H   enoxaparin  (LOVENOX ) injection  40 mg Subcutaneous Daily   insulin  aspart  0-15 Units Subcutaneous TID WC   insulin  aspart  0-5 Units Subcutaneous QHS   propofol   0.5 mg/kg Intravenous Once   Continuous Infusions:   ceFAZolin  (ANCEF ) IV     lactated ringers  100 mL/hr at 10/26/23 0939     LOS: 1 day    Time spent: over 30 min     Meliton Monte, MD [facility]   To contact the attending provider between 7A-7P or the covering provider during after hours 7P-7A, please log into the web site [email or web address] and access using universal [place] password for that web site. If you do not have the password, please call the hospital operator.  10/26/2023, 4:01 PM

## 2023-10-26 NOTE — Anesthesia Postprocedure Evaluation (Signed)
 Anesthesia Post Note  Patient: Casey [name]  Procedure(s) Performed: OPEN REDUCTION INTERNAL FIXATION (ORIF) ANKLE FRACTURE (Left: Ankle)     Patient location during evaluation: PACU Anesthesia Type: General Level of consciousness: awake and alert Pain management: pain level controlled Vital Signs Assessment: post-procedure vital signs reviewed and stable Respiratory status: spontaneous breathing, nonlabored ventilation, respiratory function stable and patient connected to nasal cannula oxygen Cardiovascular status: blood pressure returned to baseline and stable Postop Assessment: no apparent nausea or vomiting Anesthetic complications: no   No notable events documented.  Last Vitals:  Vitals:   10/26/23 1215 10/26/23 1240  BP: 138/69 133/60  Pulse: 73 72  Resp: 13 17  Temp: 36.7 C 36.5 C  SpO2: 98% 97%    Last Pain:  Vitals:   10/26/23 1215  TempSrc:   PainSc: 0-No pain                 Franky JONETTA Bald

## 2023-10-26 NOTE — Consult Note (Signed)
 Reason for Consult:Left ankle fx Referring Physician: Meliton Monte Time called: [id number] Time at bedside: 0903   Casey [name] is an 88 y.o. female.  HPI: Casey [name] was cleaning her house when she tripped and fell. She had immediate left ankle pain and could not bear weight. She was brought to the ED where x-rays showed a left ankle fx and orthopedic surgery was consulted. She lives at home alone and uses a cane for ambulation.  Past Medical History:  Diagnosis Date   ALLERGIC RHINITIS    Allergy    ANXIETY    ARM PAIN, RIGHT    BENIGN POSITIONAL VERTIGO    Carcinoma in situ of colon    CONSTIPATION    CYSTOCELE WITH INCOMPLETE UTERINE PROLAPSE    Diabetes mellitus without complication (HCC)    ESOPHAGEAL STRICTURE    FATIGUE    FLANK PAIN, LEFT    Headache(784.0)    HIP PAIN, LEFT    HYPERCHOLESTEROLEMIA    HYPERGLYCEMIA    HYPERTENSION, BENIGN ESSENTIAL 03/03/2007   LEG EDEMA, BILATERAL    Microscopic hematuria    OBESITY    OSTEOARTHRITIS, KNEE    OSTEOPOROSIS    Peripheral neuropathy, idiopathic    Problems with swallowing and mastication    SINUSITIS, RECURRENT    SPRAIN&STRAIN OTH SPEC SITES SHOULDER&UPPER ARM     Past Surgical History:  Procedure Laterality Date   BIOPSY  02/06/2022   Procedure: BIOPSY;  Surgeon: Elicia Claw, MD;  Location: [facility];  Service: Gastroenterology;;   COLONOSCOPY     ESOPHAGOGASTRODUODENOSCOPY (EGD) WITH PROPOFOL  N/A 02/06/2022   Procedure: ESOPHAGOGASTRODUODENOSCOPY (EGD) WITH PROPOFOL ;  Surgeon: Elicia Claw, MD;  Location: [facility];  Service: Gastroenterology;  Laterality: N/A;   KNEE ARTHROPLASTY Right 07/24/2020   Procedure: COMPUTER ASSISTED TOTAL KNEE ARTHROPLASTY;  Surgeon: Fidel Rogue, MD;  Location: [facility];  Service: Orthopedics;  Laterality: Right;   LEFT HEART CATHETERIZATION WITH CORONARY ANGIOGRAM N/A 12/02/2010   Procedure: LEFT HEART CATHETERIZATION WITH CORONARY ANGIOGRAM;  Surgeon: Peter M Swaziland, MD;   Location: [facility] CATH LAB;  Service: Cardiovascular;:: NORMAL CORONARY ARTERIES - NORMAL LV FUNCTION & EDP.     TRANSTHORACIC ECHOCARDIOGRAM  11/2010   Normal EF 60-65%. No RWMA.  Mild LAD dilation c/w Gr 1 DD.  -- PRETTY NORMAL ECHO    Family History  Problem Relation Age of Onset   Coronary artery disease Mother 36   Cancer Father 60   Heart disease Father    Colon cancer Other    Diabetes Other    Coronary artery disease Sister    Diabetes type II Sister    Cancer Sister    Coronary artery disease Brother    Kidney disease Brother    Coronary artery disease Brother    Breast cancer Neg Hx     Social History:  reports that she has never smoked. She has never used smokeless tobacco. She reports that she does not drink alcohol  and does not use drugs.  Allergies: No Known Allergies  Medications: I have reviewed the patient's current medications.  Results for orders placed or performed during the hospital encounter of 10/25/23 (from the past 48 hours)  Basic metabolic panel     Status: Abnormal   Collection Time: 10/25/23  5:58 PM  Result Value Ref Range   Sodium 138 135 - 145 mmol/L   Potassium 4.2 3.5 - 5.1 mmol/L   Chloride 102 98 - 111 mmol/L   CO2 21 (L) 22 - 32 mmol/L  Glucose, Bld 167 (H) 70 - 99 mg/dL    Comment: Glucose reference range applies only to samples taken after fasting for at least 8 hours.   BUN 11 8 - 23 mg/dL   Creatinine, Ser 8.89 (H) 0.44 - 1.00 mg/dL   Calcium  9.8 8.9 - 10.3 mg/dL   GFR, Estimated 49 (L) >60 mL/min    Comment: (NOTE) Calculated using the CKD-EPI Creatinine Equation (2021)    Anion gap 15 5 - 15    Comment: Performed at [facility], [address] [place], [place] [address]  CBC with Differential     Status: Abnormal   Collection Time: 10/25/23  5:58 PM  Result Value Ref Range   WBC 9.9 4.0 - 10.5 K/uL   RBC 4.73 3.87 - 5.11 MIL/uL   Hemoglobin 13.4 12.0 - 15.0 g/dL   HCT 58.3 63.9 - 53.9 %   MCV 87.9 80.0 - 100.0 fL    MCH 28.3 26.0 - 34.0 pg   MCHC 32.2 30.0 - 36.0 g/dL   RDW 84.5 88.4 - 84.4 %   Platelets 234 150 - 400 K/uL   nRBC 0.0 0.0 - 0.2 %   Neutrophils Relative % 78 %   Neutro Abs 7.8 (H) 1.7 - 7.7 K/uL   Lymphocytes Relative 14 %   Lymphs Abs 1.4 0.7 - 4.0 K/uL   Monocytes Relative 6 %   Monocytes Absolute 0.6 0.1 - 1.0 K/uL   Eosinophils Relative 0 %   Eosinophils Absolute 0.0 0.0 - 0.5 K/uL   Basophils Relative 1 %   Basophils Absolute 0.1 0.0 - 0.1 K/uL   Immature Granulocytes 1 %   Abs Immature Granulocytes 0.06 0.00 - 0.07 K/uL    Comment: Performed at [facility], [address], [place], [place] [address]  Protime-INR     Status: None   Collection Time: 10/25/23  5:58 PM  Result Value Ref Range   Prothrombin Time 13.9 11.4 - 15.2 seconds   INR 1.0 0.8 - 1.2    Comment: (NOTE) INR goal varies based on device and disease states. Performed at [facility], [address], [place], [place] [address]   Urinalysis, Routine w reflex microscopic -Urine, Catheterized     Status: Abnormal   Collection Time: 10/25/23 11:45 PM  Result Value Ref Range   Color, Urine YELLOW YELLOW   APPearance CLEAR CLEAR   Specific Gravity, Urine 1.021 1.005 - 1.030   pH 7.0 5.0 - 8.0   Glucose, UA >=500 (A) NEGATIVE mg/dL   Hgb urine dipstick NEGATIVE NEGATIVE   Bilirubin Urine NEGATIVE NEGATIVE   Ketones, ur 20 (A) NEGATIVE mg/dL   Protein, ur 30 (A) NEGATIVE mg/dL   Nitrite NEGATIVE NEGATIVE   Leukocytes,Ua NEGATIVE NEGATIVE   RBC / HPF 0-5 0 - 5 RBC/hpf   WBC, UA 0-5 0 - 5 WBC/hpf   Bacteria, UA RARE (A) NONE SEEN   Squamous Epithelial / HPF 0-5 0 - 5 /HPF    Comment: Performed at [facility], [address], [place], [place] [address]  CBC     Status: Abnormal   Collection Time: 10/26/23  5:30 AM  Result Value Ref Range   WBC 11.2 (H) 4.0 - 10.5 K/uL   RBC 3.62 (L) 3.87 - 5.11 MIL/uL   Hemoglobin 11.1 (L) 12.0 - 15.0 g/dL   HCT 64.2 (L) 63.9 - 53.9 %   MCV 98.6  80.0 - 100.0 fL  Comment: DELTA CHECK NOTED REPEATED TO VERIFY    MCH 30.7 26.0 - 34.0 pg   MCHC 31.1 30.0 - 36.0 g/dL   RDW 84.1 (H) 88.4 - 84.4 %   Platelets 250 150 - 400 K/uL    Comment: REPEATED TO VERIFY   nRBC 0.2 0.0 - 0.2 %    Comment: Performed at [facility], [address] [place] [address] [place], [place] [address]  Comprehensive metabolic panel with GFR     Status: Abnormal   Collection Time: 10/26/23  7:34 AM  Result Value Ref Range   Sodium 140 135 - 145 mmol/L   Potassium 4.1 3.5 - 5.1 mmol/L   Chloride 105 98 - 111 mmol/L   CO2 21 (L) 22 - 32 mmol/L   Glucose, Bld 124 (H) 70 - 99 mg/dL    Comment: Glucose reference range applies only to samples taken after fasting for at least 8 hours.   BUN 12 8 - 23 mg/dL   Creatinine, Ser 8.88 (H) 0.44 - 1.00 mg/dL   Calcium  9.6 8.9 - 10.3 mg/dL   Total Protein 7.4 6.5 - 8.1 g/dL   Albumin 3.8 3.5 - 5.0 g/dL   AST 17 15 - 41 U/L   ALT 12 0 - 44 U/L   Alkaline Phosphatase 93 38 - 126 U/L   Total Bilirubin 1.0 0.0 - 1.2 mg/dL   GFR, Estimated 48 (L) >60 mL/min    Comment: (NOTE) Calculated using the CKD-EPI Creatinine Equation (2021)    Anion gap 14 5 - 15    Comment: Performed at [facility], [address] [place], [place] [address]  Glucose, capillary     Status: Abnormal   Collection Time: 10/26/23  8:32 AM  Result Value Ref Range   Glucose-Capillary 115 (H) 70 - 99 mg/dL    Comment: Glucose reference range applies only to samples taken after fasting for at least 8 hours.   Comment 1 Notify RN    Comment 2 Document in Chart     CT Ankle Left Wo Contrast Result Date: 10/25/2023 CLINICAL DATA:  Postreduction complex fracture of the left ankle. EXAM: CT OF THE LEFT ANKLE WITHOUT CONTRAST TECHNIQUE: Multidetector CT imaging of the left ankle was performed according to the standard protocol. Multiplanar CT image reconstructions were also generated. RADIATION DOSE REDUCTION: This exam was performed according to the  departmental dose-optimization program which includes automated exposure control, adjustment of the mA and/or kV according to patient size and/or use of iterative reconstruction technique. COMPARISON:  Left ankle and tib-fib radiographs 10/25/2023 FINDINGS: Bones/Joint/Cartilage Trimalleolar fracture of the left ankle. Oblique fracture of the distal fibular diaphysis and metadiaphysis with about 4 mm lateral displacement of the distal fracture fragment. Alignment appears near anatomic. Several small acute ossicles demonstrated inferior to the medial malleolus consistent with avulsion fractures. Coronal fracture of the posterior malleolus with mild superior displacement of the fracture fragment resulting in about a 3 mm cortical step-off at the articular surface. No residual dislocation demonstrated at the tibiotalar joint. There is a tiny loose body demonstrated centrally in the tibiotalar joint. Mild degenerative changes in the intertarsal joints. Old appearing ossicle at the Achilles calcaneal insertion consistent with sesamoid bone. Ligaments Suboptimally assessed by CT. Muscles and Tendons No intramuscular mass or hematoma demonstrated. Soft tissues Vascular calcifications in the runoff vessels. Diffuse soft tissue swelling/edema. No loculated collections. IMPRESSION: 1. Trimalleolar fractures of the left ankle as discussed above. No significant residual dislocation. 2. Tiny loose  body suggested in the tibiotalar joint. 3. Diffuse soft tissue swelling. 4. Degenerative changes in the intertarsal joints. Electronically Signed   By: Elsie Gravely M.D.   On: 10/25/2023 20:31   DG Ankle Complete Left Result Date: 10/25/2023 CLINICAL DATA:  Recent fall with ankle deformity EXAM: LEFT ANKLE COMPLETE - 3+ VIEW COMPARISON:  Film from earlier in the same day. FINDINGS: There remains evidence of a trimalleolar fracture of the distal tibia and fibula. The talus is posteriorly and laterally dislocated with respect to  the distal tibia. Tarsal degenerative changes are noted. Soft tissue swelling is seen. IMPRESSION: Trimalleolar fracture dislocation of the ankle with posterolateral dislocation of the talus with respect to the distal tibia. Electronically Signed   By: Oneil Devonshire M.D.   On: 10/25/2023 19:39   DG Chest Portable 1 View Result Date: 10/25/2023 CLINICAL DATA:  fall EXAM: PORTABLE CHEST - 1 VIEW COMPARISON:  May 29, 2022, May 04, 2022 FINDINGS: Low lung volumes. No focal airspace consolidation, pleural effusion, or pneumothorax. No cardiomegaly. Aortic atherosclerosis. No acute fracture or destructive lesions. Multilevel thoracic osteophytosis. IMPRESSION: No acute cardiopulmonary abnormality. Electronically Signed   By: Rogelia Myers M.D.   On: 10/25/2023 18:48   DG Ankle 2 Views Left Result Date: 10/25/2023 CLINICAL DATA:  fall EXAM: LEFT ANKLE - 2 VIEW COMPARISON:  None Available. FINDINGS: Tibia and fibula Osteopenia.No acute fracture or malalignment of the proximal tibia and fibula. Mild-to-moderate tricompartmental osteoarthritis of the knee. Soft tissues are unremarkable. Ankle Osteopenia.Fracture-dislocation of the ankle. Obliquely oriented, displaced fracture of the distal fibula above the level of the tibial plafond. The distal fibular fracture fragment is displaced posteriorly approximately 5-6 mm. Posterior dislocation of the talus with respect to the tibia. A second fracture fragment along the posterior aspect of the tibia, may represent a posterior malleolus fracture with 5 mm of posterior displacement. Moderate soft tissue swelling about the ankle. IMPRESSION: 1. Bimalleolar fracture-dislocation of the ankle, as described above. 2. No acute fracture or malalignment of the proximal tibia and fibula. Electronically Signed   By: Rogelia Myers M.D.   On: 10/25/2023 18:42   DG Tibia/Fibula Left Port Result Date: 10/25/2023 CLINICAL DATA:  [id number] Fall [id number] EXAM: PORTABLE LEFT TIBIA AND FIBULA  - 2 VIEW COMPARISON:  None Available. FINDINGS: Tibia and fibula Osteopenia.No acute fracture or malalignment of the proximal tibia and fibula. Mild-to-moderate tricompartmental osteoarthritis of the knee. Soft tissues are unremarkable. Ankle Osteopenia.Fracture-dislocation of the ankle. Obliquely oriented, displaced fracture of the distal fibula above the level of the tibial plafond. The distal fibular fracture fragment is displaced posteriorly approximately 5-6 mm. Posterior dislocation of the talus with respect to the tibia. A second fracture fragment along the posterior aspect of the tibia, may represent a posterior malleolus fracture with 5 mm of posterior displacement. Moderate soft tissue swelling about the ankle. IMPRESSION: 1. Bimalleolar fracture dislocation of the ankle, as described above. 2. No acute fracture or malalignment of the proximal tibia and fibula. Electronically Signed   By: Rogelia Myers M.D.   On: 10/25/2023 18:42    Review of Systems  HENT:  Negative for ear discharge, ear pain, hearing loss and tinnitus.   Eyes:  Negative for photophobia and pain.  Respiratory:  Negative for cough and shortness of breath.   Cardiovascular:  Negative for chest pain.  Gastrointestinal:  Negative for abdominal pain, nausea and vomiting.  Genitourinary:  Negative for dysuria, flank pain, frequency and urgency.  Musculoskeletal:  Positive for arthralgias (Left  ankle). Negative for back pain, myalgias and neck pain.  Neurological:  Negative for dizziness and headaches.  Hematological:  Does not bruise/bleed easily.  Psychiatric/Behavioral:  The patient is not nervous/anxious.    Blood pressure (!) 159/89, pulse 85, temperature 99.2 F (37.3 C), temperature source Oral, resp. rate 17, height 5' 2 (1.575 m), weight 72.6 kg, SpO2 98%. Physical Exam Constitutional:      General: She is not in acute distress.    Appearance: She is well-developed. She is not diaphoretic.  HENT:     Head:  Normocephalic and atraumatic.  Eyes:     General: No scleral icterus.       Right eye: No discharge.        Left eye: No discharge.     Conjunctiva/sclera: Conjunctivae normal.  Cardiovascular:     Rate and Rhythm: Normal rate and regular rhythm.  Pulmonary:     Effort: Pulmonary effort is normal. No respiratory distress.  Musculoskeletal:     Cervical back: Normal range of motion.     Comments: LLE No traumatic wounds, ecchymosis, or rash  Short leg splint in place  No knee effusion  Knee stable to varus/ valgus and anterior/posterior stress  Sens DPN, SPN, TN intact  Motor EHL 5/5  Toes perfused, No significant edema  Skin:    General: Skin is warm and dry.  Neurological:     Mental Status: She is alert.  Psychiatric:        Mood and Affect: Mood normal.        Behavior: Behavior normal.     Assessment/Plan: Left ankle fx -- Plan ORIF today with Dr. Celena. Please keep NPO. Multiple medical problems including essential hypertension, type 2 diabetes, hyperlipidemia, generalized anxiety disorder, dementia, depression, and chronic kidney disease stage IIIa -- per primary service    Ozell DOROTHA Ned, PA-C Orthopedic Surgery [phone] 10/26/2023, 9:06 AM

## 2023-10-26 NOTE — Anesthesia Procedure Notes (Signed)
 Anesthesia Regional Block: Popliteal block   Pre-Anesthetic Checklist: , timeout performed,  Correct Patient, Correct Site, Correct Laterality,  Correct Procedure, Correct Position, site marked,  Risks and benefits discussed,  Surgical consent,  Pre-op evaluation,  At surgeon's request and post-op pain management  Laterality: Left  Prep: chloraprep       Needles:  Injection technique: Single-shot  Needle Type: Echogenic Stimulator Needle     Needle Length: 9cm  Needle Gauge: 21     Additional Needles:   Procedures:,,,, ultrasound used (permanent image in chart),,    Narrative:  Start time: 10/26/2023 9:15 AM End time: 10/26/2023 9:20 AM Injection made incrementally with aspirations every 5 mL.  Performed by: Personally  Anesthesiologist: Tilford Franky BIRCH, MD  Additional Notes: Discussed risks and benefits of the nerve block in detail, including but not limited vascular injury, permanent nerve damage and infection.   Patient tolerated the procedure well. Local anesthetic introduced in an incremental fashion under minimal resistance after negative aspirations. No paresthesias were elicited. After completion of the procedure, no acute issues were identified and patient continued to be monitored by RN.

## 2023-10-26 NOTE — Anesthesia Preprocedure Evaluation (Addendum)
 Anesthesia Evaluation  Patient identified by MRN, date of birth, ID band Patient awake    Reviewed: Allergy & Precautions, NPO status , Patient's Chart, lab work & pertinent test results  Airway Mallampati: I  TM Distance: >3 FB Neck ROM: Full    Dental  (+) Edentulous Upper, Edentulous Lower   Pulmonary neg pulmonary ROS   breath sounds clear to auscultation       Cardiovascular hypertension, Pt. on medications  Rhythm:Regular Rate:Normal     Neuro/Psych  Headaches PSYCHIATRIC DISORDERS Anxiety Depression     Neuromuscular disease    GI/Hepatic Neg liver ROS, PUD,GERD  Medicated,,  Endo/Other  diabetes, Type 2, Oral Hypoglycemic Agents    Renal/GU Renal disease     Musculoskeletal  (+) Arthritis ,    Abdominal   Peds  Hematology negative hematology ROS (+)   Anesthesia Other Findings   Reproductive/Obstetrics                              Anesthesia Physical Anesthesia Plan  ASA: 3  Anesthesia Plan: General   Post-op Pain Management: Regional block*   Induction: Intravenous  PONV Risk Score and Plan: 4 or greater and Ondansetron  and Treatment may vary due to age or medical condition  Airway Management Planned: LMA  Additional Equipment: None  Intra-op Plan:   Post-operative Plan: Extubation in OR  Informed Consent: I have reviewed the patients History and Physical, chart, labs and discussed the procedure including the risks, benefits and alternatives for the proposed anesthesia with the patient or authorized representative who has indicated his/her understanding and acceptance.     Dental advisory given  Plan Discussed with: CRNA  Anesthesia Plan Comments:          Anesthesia Quick Evaluation

## 2023-10-26 NOTE — TOC Initial Note (Signed)
 Transition of Care [facility]) - Initial/Assessment Note    Patient Details  Name: Casey [name] MRN: [id number] Date of Birth: [birth year]  [facility] CM/SW Contact:    Casey Jon Bloch, RN Phone Number: 10/26/2023, 4:47 PM  Clinical Narrative:                 Present after a fall. Suffered L ankle fx. From home alone. Supportive niece Casey [name]. PTA independent with ADL's.   - s/p ORIF OF LEFT TRIMALLEOLAR ANKLE FRACTURE , 10/7  PT/OT  evaluations pending....  IM CM following and will assist with transition of care needs.  Expected Discharge Plan: Home/Self Care Barriers to Discharge: Continued Medical Work up   Patient Goals and CMS Choice            Expected Discharge Plan and Services   Discharge Planning Services: CM Consult                                          Prior Living Arrangements/Services   Lives with:: Self Patient language and need for interpreter reviewed:: Yes Do you feel safe going back to the place where you live?: Yes      Need for Family Participation in Patient Care: Yes (Comment) Care giver support system in place?: Yes (comment) Current home services: DME (cane , rw , showerseat) Criminal Activity/Legal Involvement Pertinent to Current Situation/Hospitalization: No - Comment as needed  Activities of Daily Living   ADL Screening (condition at time of admission) Independently performs ADLs?: No Does the patient have a NEW difficulty with bathing/dressing/toileting/self-feeding that is expected to last >3 days?: Yes (Initiates electronic notice to provider for possible OT consult) Does the patient have a NEW difficulty with getting in/out of bed, walking, or climbing stairs that is expected to last >3 days?: Yes (Initiates electronic notice to provider for possible PT consult) Does the patient have a NEW difficulty with communication that is expected to last >3 days?: No Is the patient deaf or have difficulty hearing?:  No Does the patient have difficulty seeing, even when wearing glasses/contacts?: No Does the patient have difficulty concentrating, remembering, or making decisions?: No  Permission Sought/Granted      Share Information with NAME: Casey [name]  Niece  [phone]           Emotional Assessment Appearance:: Appears stated age     Orientation: : Oriented to Self, Oriented to Place, Oriented to  Time, Oriented to Situation Alcohol  / Substance Use: Not Applicable Psych Involvement: No (comment)  Admission diagnosis:  Fall [W19.XXXA] Fall, initial encounter [id number]] Closed left ankle fracture, initial encounter [S82.892A] Ankle dislocation, left, initial encounter [S93.05XA] Patient Active Problem List   Diagnosis Date Noted   Fall 10/25/2023   Left trimalleolar fracture, closed, initial encounter 10/25/2023   CKD stage 3a, GFR 45-59 ml/min (HCC) 07/02/2023   Leg edema 08/04/2022   Poor compliance with medication 06/11/2022   Chronic painful diabetic neuropathy (HCC) 06/11/2022   Debility 04/29/2022   Renal cyst, Bilateral 02/12/2022   Chronic pain 12/26/2021   Depression, major, single episode, moderate (HCC) 09/26/2021   Insomnia 09/26/2021   Dementia without behavioral disturbance (HCC) 01/03/2020   PUD (peptic ulcer disease) 07/17/2019   Vitamin B12 deficiency 07/14/2017   Osteoarthritis 07/07/2013   Type 2 diabetes mellitus with microalbuminuria, without long-term current use of insulin  (HCC) 05/26/2012   Long  Q-T syndrome 11/26/2010   CYSTOCELE WITH INCOMPLETE UTERINE PROLAPSE 03/10/2010   MICROSCOPIC HEMATURIA 07/29/2009   Sinusitis, chronic 02/23/2008   BENIGN POSITIONAL VERTIGO 04/25/2007   Dyslipidemia associated with type 2 diabetes mellitus (HCC) 03/03/2007   GAD (generalized anxiety disorder) 03/03/2007   Hypertension associated with diabetes (HCC) 03/03/2007   Allergic rhinitis 03/03/2007   Osteoporosis 03/03/2007   PCP:  Kennyth Worth HERO,  MD Pharmacy:   [facility] [phone] - [place], [place] - [address] AT [address] [place] [address] Phone: [phone] Fax: [phone]  [id number] [place], [place] - [address] AT [address] [place] [address] Phone: [phone] Fax: [phone]  [facility] [address] [place] [address] Phone: [phone] Fax: [phone]     Social Drivers of Health (SDOH) Social History: SDOH Screenings   Food Insecurity: No Food Insecurity (10/26/2023)  Housing: Low Risk  (10/26/2023)  Transportation Needs: No Transportation Needs (10/26/2023)  Utilities: Not At Risk (10/26/2023)  Alcohol  Screen: Low Risk  (04/29/2023)  Depression (PHQ2-9): High Risk (09/30/2023)  Financial Resource Strain: Low Risk  (04/29/2023)  Physical Activity: Insufficiently Active (04/29/2023)  Social Connections: Moderately Isolated (10/26/2023)  Stress: Stress Concern Present (04/29/2023)  Tobacco Use: Low Risk  (10/26/2023)  Health Literacy: Adequate Health Literacy (04/29/2023)   SDOH Interventions:     Readmission Risk Interventions     No data to display

## 2023-10-26 NOTE — ED Notes (Signed)
 Light green lab sent to lab to re-check kidneys. Called the flood to let them pt is coming.  PT breathing is even and unlabored.  PT may have surgery today. A and O x4.

## 2023-10-26 NOTE — Op Note (Signed)
 10/26/2023  11:48 AM  PATIENT:  Casey [name]  [birth year] female   MEDICAL RECORD NUMBER: [id number]  PRE-OPERATIVE DIAGNOSIS:   1. LEFT ANKLE TRIMALLEOLAR FRACTURE DISLOCATION 2. SYNDESMOSIS DISRUPTION  POST-OPERATIVE DIAGNOSIS:   1. LEFT ANKLE TRIMALLEOLAR FRACTURE DISLOCATION 2. SYNDESMOSIS DISRUPTION  PROCEDURES:   OPEN REDUCTION INTERNAL FIXATION OF LEFT TRIMALLEOLAR ANKLE FRACTURE WITH FIXATION OF THE POSTERIOR LIP OPEN REDUCTION INTERNAL FIXATION OF THE SYNDESMOSIS MANUAL APPLICATION OF STRESS ANKLE SYNDESMOSIS UNDER FLUOROSCOPY  SURGEON:  Ozell DEL. Celena, M.D.  ASSISTANT:  None.  ANESTHESIA:  General.  COMPLICATIONS:  None.  TOURNIQUET: None.  ESTIMATED BLOOD LOSS:  50 mL.  DISPOSITION:  To PACU.  CONDITION:  Stable.  DELAY START OF DVT PROPHYLAXIS BECAUSE OF BLEEDING RISK: NO   BRIEF SUMMARY AND INDICATIONS FOR PROCEDURE:  The patient is a 88 y.o. who sustained a fracture dislocation of the ankle, treated with reduction at the time of presentation to the Emergency Department, followed by splint application. Patient subsequently seen to reassess soft tissues, which show sufficient swelling resolution to allow for surgical repair.  I discussed with the patient the risks and benefits of surgery including the possibility of infection, DVT, PE, nerve injury, vessel injury, loss of motion, arthritis, symptomatic hardware, heart attack, stroke and need for further surgery, among others. Some of the implants used for syndesmotic repair may require subsequent removal. After acknowledging these risks, consent was provided to proceed.  SUMMARY OF PROCEDURE:  The patient was taken to the operating room after administration of a regional block and preoperative antibiotics.  The left lower extremity was prepped and draped in the usual sterile fashion.  A tourniquet was placed about the thigh but never inflated during the procedure.  A timeout was held, and then an incision was  made directly over the lateral malleolus with careful dissection to avoid injury to the superficial peroneal nerve.  The periosteum was left intact as we continued deep dissection.  The fracture site was identified and curettage and lavage used to remove hematoma.  With the assistance of distal manipulation and placement of tenaculums, we were able to obtain an anatomic reduction with  interdigitation of the primary fracture fragments.  I was able to place a lag screw anteriorly which held this interdigitated during application of a lateral malleolus plate.  We used the Synthes system and placed standard fixation in the shaft and distal lateral malleolus, confirming plate position with x-ray and then continuing with locked fixation.  Final images showed appropriate reductional replacement, trajectory, and length.  Medial malleolus avulsion fragments were too small for fixation.  Next, attention was turned to the posterior malleolar fracture fragment which constituted a significant portion of the articular surface and an important attachment site of the syndesmotic ligaments, such that its disruption and displacement contributed to instability. Based on these factors it did require fixation. The guide pin for a cannulated screw was placed using fluoroscopic guidance from anteromedial to posterolateral in the subchondral bone, overdrilling the near side. This produced compression, and the screw appeared to be of appropriate length and trajectory on multiple images.   We then proceeded with syndesmotic fixation and repair. Using the slotted holes in the fibula plate two quadricortical screws were then placed from the fibula into the tibia with 15 degrees of anteversion.   After syndesmotic fixation an external rotation stress view of the ankle under live fluoroscopy was performed to confirm adequate repair.  No further syndesmotic widening nor widening of the medial clear  space was identified to suggest  continued syndesmotic instability or displacement.  Wounds were irrigated once more and then closed in standard layered fashion using 2-0 Vicryl and 3-0 nylon.  A sterile gently compressive dressing was applied, and then a posterior and stirrup splint with the ankle extended just above neutral.   PROGNOSIS: The patient will be nonweightbearing in the splint with ice and elevation.  We will plan to see her back in the office in 10-14 days for removal of sutures and transition to a Cam boot with unrestricted range of motion of the  ankle at that time.  Weightbearing at 8 weeks. Aggressive glucose control to reduce infection risks. She is at high risk of nonunion and loss of reduction given her diabetes and fracture dislocation pattern of injury.

## 2023-10-26 NOTE — Transfer of Care (Signed)
 Immediate Anesthesia Transfer of Care Note  Patient: Casey [name]  Procedure(s) Performed: OPEN REDUCTION INTERNAL FIXATION (ORIF) ANKLE FRACTURE (Left: Ankle)  Patient Location: PACU  Anesthesia Type:GA combined with regional for post-op pain  Level of Consciousness: awake, alert , and oriented  Airway & Oxygen Therapy: Patient Spontanous Breathing  Post-op Assessment: Report given to RN and Post -op Vital signs reviewed and stable  Post vital signs: Reviewed and stable  Last Vitals:  Vitals Value Taken Time  BP 127/107 10/26/23 11:38  Temp 98   Pulse 81 10/26/23 11:40  Resp 18 10/26/23 11:40  SpO2 100 % 10/26/23 11:40  Vitals shown include unfiled device data.  Last Pain:  Vitals:   10/26/23 0930  TempSrc:   PainSc: 6       Patients Stated Pain Goal: 0 (10/25/23 1617)  Complications: No notable events documented.

## 2023-10-26 NOTE — Plan of Care (Signed)

## 2023-10-26 NOTE — Anesthesia Procedure Notes (Signed)
 Procedure Name: LMA Insertion Date/Time: 10/26/2023 9:45 AM  Performed by: Zelphia Norleen HERO, CRNAPre-anesthesia Checklist: Patient identified, Emergency Drugs available, Suction available and Patient being monitored Patient Re-evaluated:Patient Re-evaluated prior to induction Oxygen Delivery Method: Circle system utilized Preoxygenation: Pre-oxygenation with 100% oxygen Induction Type: IV induction LMA: LMA inserted LMA Size: 4.0 Tube type: Oral Number of attempts: 1 Placement Confirmation: positive ETCO2 and breath sounds checked- equal and bilateral Tube secured with: Tape Dental Injury: Teeth and Oropharynx as per pre-operative assessment

## 2023-10-26 NOTE — Progress Notes (Signed)
 Patient ID: Casey [name], female   DOB: 09-26-1935, 88 y.o.   MRN: [id number]  Consult received for left ankle fracture dislocation  Nice anatomic reduction from ER, in splint  Will consult Ortho Trauma about definitive management  Full note will follow pending Trauma plan

## 2023-10-27 ENCOUNTER — Encounter (HOSPITAL_COMMUNITY): Payer: Self-pay | Admitting: Orthopedic Surgery

## 2023-10-27 DIAGNOSIS — S82852A Displaced trimalleolar fracture of left lower leg, initial encounter for closed fracture: Secondary | ICD-10-CM | POA: Diagnosis not present

## 2023-10-27 LAB — CBC WITH DIFFERENTIAL/PLATELET
Abs Immature Granulocytes: 0.04 K/uL (ref 0.00–0.07)
Basophils Absolute: 0.1 K/uL (ref 0.0–0.1)
Basophils Relative: 1 %
Eosinophils Absolute: 0.1 K/uL (ref 0.0–0.5)
Eosinophils Relative: 1 %
HCT: 40.1 % (ref 36.0–46.0)
Hemoglobin: 12.9 g/dL (ref 12.0–15.0)
Immature Granulocytes: 0 %
Lymphocytes Relative: 24 %
Lymphs Abs: 2.4 K/uL (ref 0.7–4.0)
MCH: 28.4 pg (ref 26.0–34.0)
MCHC: 32.2 g/dL (ref 30.0–36.0)
MCV: 88.3 fL (ref 80.0–100.0)
Monocytes Absolute: 1.2 K/uL — ABNORMAL HIGH (ref 0.1–1.0)
Monocytes Relative: 12 %
Neutro Abs: 6.2 K/uL (ref 1.7–7.7)
Neutrophils Relative %: 62 %
Platelets: 235 K/uL (ref 150–400)
RBC: 4.54 MIL/uL (ref 3.87–5.11)
RDW: 15.6 % — ABNORMAL HIGH (ref 11.5–15.5)
WBC: 9.9 K/uL (ref 4.0–10.5)
nRBC: 0 % (ref 0.0–0.2)

## 2023-10-27 LAB — COMPREHENSIVE METABOLIC PANEL WITH GFR
ALT: 12 U/L (ref 0–44)
AST: 17 U/L (ref 15–41)
Albumin: 3.4 g/dL — ABNORMAL LOW (ref 3.5–5.0)
Alkaline Phosphatase: 88 U/L (ref 38–126)
Anion gap: 16 — ABNORMAL HIGH (ref 5–15)
BUN: 23 mg/dL (ref 8–23)
CO2: 20 mmol/L — ABNORMAL LOW (ref 22–32)
Calcium: 9 mg/dL (ref 8.9–10.3)
Chloride: 104 mmol/L (ref 98–111)
Creatinine, Ser: 1.47 mg/dL — ABNORMAL HIGH (ref 0.44–1.00)
GFR, Estimated: 35 mL/min — ABNORMAL LOW (ref >60)
Glucose, Bld: 118 mg/dL — ABNORMAL HIGH (ref 70–99)
Potassium: 3.9 mmol/L (ref 3.5–5.1)
Sodium: 140 mmol/L (ref 135–145)
Total Bilirubin: 0.9 mg/dL (ref 0.0–1.2)
Total Protein: 6.6 g/dL (ref 6.5–8.1)

## 2023-10-27 LAB — VITAMIN D 25 HYDROXY (VIT D DEFICIENCY, FRACTURES): Vit D, 25-Hydroxy: 16.07 ng/mL — ABNORMAL LOW (ref 30–100)

## 2023-10-27 LAB — MAGNESIUM: Magnesium: 2.1 mg/dL (ref 1.7–2.4)

## 2023-10-27 LAB — GLUCOSE, CAPILLARY
Glucose-Capillary: 164 mg/dL — ABNORMAL HIGH (ref 70–99)
Glucose-Capillary: 157 mg/dL — ABNORMAL HIGH (ref 70–99)
Glucose-Capillary: 137 mg/dL — ABNORMAL HIGH (ref 70–99)
Glucose-Capillary: 133 mg/dL — ABNORMAL HIGH (ref 70–99)

## 2023-10-27 LAB — PHOSPHORUS: Phosphorus: 4.1 mg/dL (ref 2.5–4.6)

## 2023-10-27 MED ORDER — LACTATED RINGERS IV SOLN: INTRAVENOUS | Status: AC

## 2023-10-27 MED ORDER — ENOXAPARIN SODIUM 30 MG/0.3ML IJ SOSY
30.0000 mg | PREFILLED_SYRINGE | Freq: Every day | INTRAMUSCULAR | Status: DC
  Administered 2023-10-28 – 2023-10-29 (×2): 30 mg via SUBCUTANEOUS
  Filled 2023-10-27 (×2): qty 0.3

## 2023-10-27 MED ORDER — VITAMIN D (ERGOCALCIFEROL) 1.25 MG (50000 UNIT) PO CAPS
50000.0000 [IU] | ORAL_CAPSULE | ORAL | Status: DC
  Administered 2023-10-27: 50000 [IU] via ORAL
  Filled 2023-10-27: qty 1

## 2023-10-27 NOTE — Progress Notes (Signed)
 RE:  Casey [name]       Date of Birth: 2035-10-22      Date:   10/27/23       To Whom It May Concern:  Please be advised that the above-named patient will require a short-term nursing home stay - anticipated 30 days or less for rehabilitation and strengthening.  The plan is for return home.                 MD signature                Date

## 2023-10-27 NOTE — Evaluation (Signed)
 Occupational Therapy Evaluation Patient Details Name: Casey [name] MRN: [id number] DOB: 12-27-1935 Today's Date: 10/27/2023   History of Present Illness   Casey [name] is a 88 y.o. female admitted 10/25/23 after mechanical fall, sustaining left trimalleolar ankle fracture dislocation and syndesmotic disruption. Pt s/p ORIF 10/7. PMHx: essential HTN, HLD, T2DM, dementia with behavioral disturbance,  generalized anxiety disorder, and CKD IIIa.     Clinical Impressions Pt walked with a cane, lived independently with assist for managing finances and was driving prior to admission. Her family was checking on her frequently. Pt presents with L LE pain, poor standing balance with difficulty maintaining NWB on L LE and generalized weakness. Pt requires min assist to stand from recliner with multimodal cues for technique. She needs set up to total assist for ADLs. Patient will benefit from continued inpatient follow up therapy, <3 hours/day.     If plan is discharge home, recommend the following:   A lot of help with walking and/or transfers;A lot of help with bathing/dressing/bathroom;Assistance with cooking/housework;Direct supervision/assist for financial management;Assist for transportation;Help with stairs or ramp for entrance     Functional Status Assessment   Patient has had a recent decline in their functional status and demonstrates the ability to make significant improvements in function in a reasonable and predictable amount of time.     Equipment Recommendations   Other (comment) (defer)     Recommendations for Other Services         Precautions/Restrictions   Precautions Precautions: Fall Recall of Precautions/Restrictions: Intact Required Braces or Orthoses: Splint/Cast Splint/Cast: Short Leg Splint LLE Splint/Cast - Date Prophylactic Dressing Applied (if applicable): 10/26/23 Restrictions Weight Bearing Restrictions Per Provider Order: Yes LLE Weight Bearing  Per Provider Order: Non weight bearing     Mobility Bed Mobility               General bed mobility comments: pt received and returned to chair    Transfers Overall transfer level: Needs assistance Equipment used: Rolling walker (2 wheels) Transfers: Sit to/from Stand Sit to Stand: Min assist           General transfer comment: cues for technique, assist to maintain NWB on L LE, assist to rise and steady      Balance Overall balance assessment: Needs assistance   Sitting balance-Leahy Scale: Fair Sitting balance - Comments: at edge of chair   Standing balance support: Bilateral upper extremity supported, During functional activity, Reliant on assistive device for balance Standing balance-Leahy Scale: Poor                             ADL either performed or assessed with clinical judgement   ADL Overall ADL's : Needs assistance/impaired Eating/Feeding: Independent;Sitting   Grooming: Oral care;Wash/dry hands;Wash/dry face;Sitting;Set up   Upper Body Bathing: Set up;Sitting   Lower Body Bathing: Total assistance;Sit to/from stand   Upper Body Dressing : Set up;Sitting   Lower Body Dressing: Total assistance;Sit to/from stand                 General ADL Comments: Began educating pt in compensatory strategies for LB bathing and dressing leaning side to side as pt is unable to stand without weight on her L foot.     Vision Ability to See in Adequate Light: 0 Adequate Patient Visual Report: No change from baseline       Perception         Praxis  Pertinent Vitals/Pain Pain Assessment Pain Assessment: Faces Faces Pain Scale: Hurts whole lot Pain Location: L ankle Pain Descriptors / Indicators: Discomfort, Aching, Sore Pain Intervention(s): Monitored during session, Repositioned     Extremity/Trunk Assessment Upper Extremity Assessment Upper Extremity Assessment: Overall WFL for tasks assessed   Lower Extremity  Assessment Lower Extremity Assessment: Defer to PT evaluation LLE Deficits / Details: Pt POD 1 s/p L ankle ORIF. Pt in splint spanning ankle joint. Decreased hip and knee AROM d/t pain. Unable to assess ankle. Grossly 3-/5 strength. LLE: Unable to fully assess due to pain;Unable to fully assess due to immobilization LLE Coordination: decreased gross motor   Cervical / Trunk Assessment Cervical / Trunk Assessment: Normal   Communication Communication Communication: No apparent difficulties   Cognition Arousal: Alert Behavior During Therapy: WFL for tasks assessed/performed Cognition: No family/caregiver present to determine baseline             OT - Cognition Comments: known hx of dementia                 Following commands: Intact       Cueing  General Comments   Cueing Techniques: Verbal cues  VSS on RA   Exercises     Shoulder Instructions      Home Living Family/patient expects to be discharged to:: Private residence Living Arrangements: Alone Available Help at Discharge: Family;Friend(s);Available PRN/intermittently Type of Home: House Home Access: Stairs to enter [employer] of Steps: 1 Entrance Stairs-Rails: Right;Left Home Layout: One level     Bathroom Shower/Tub: Chief Strategy Officer: Standard Bathroom Accessibility: Yes   Home Equipment: Pharmacist, hospital (2 wheels);Cane - single point          Prior Functioning/Environment Prior Level of Function : Independent/Modified Independent;Driving;History of Falls (last six months)             Mobility Comments: Ambulates using SPC. 1 fall leading to current admission. ADLs Comments: ModI with ADLs/IADLs. Manages her own medications. Cooks and clean. Niece assists with finances.    OT Problem List: Decreased activity tolerance;Impaired balance (sitting and/or standing);Pain;Decreased strength;Decreased knowledge of use of DME or AE;Decreased cognition    OT Treatment/Interventions: Self-care/ADL training;DME and/or AE instruction;Therapeutic activities;Patient/family education;Balance training      OT Goals(Current goals can be found in the care plan section)   Acute Rehab OT Goals OT Goal Formulation: With patient Time For Goal Achievement: 11/10/23 Potential to Achieve Goals: Good ADL Goals Pt Will Perform Lower Body Bathing: with min assist;with adaptive equipment;sitting/lateral leans Pt Will Perform Lower Body Dressing: with min assist;with adaptive equipment;sitting/lateral leans Pt Will Transfer to Toilet: with min assist;stand pivot transfer;bedside commode Pt Will Perform Toileting - Clothing Manipulation and hygiene: with supervision;sitting/lateral leans Additional ADL Goal #1: Pt will maintain NWB on L LE during ADL and ADL transfers.   OT Frequency:  Min 2X/week    Co-evaluation              AM-PAC OT 6 Clicks Daily Activity     Outcome Measure Help from another person eating meals?: None Help from another person taking care of personal grooming?: A Little Help from another person toileting, which includes using toliet, bedpan, or urinal?: Total Help from another person bathing (including washing, rinsing, drying)?: A Lot Help from another person to put on and taking off regular upper body clothing?: A Little Help from another person to put on and taking off regular lower body clothing?: Total 6 Click Score:  14   End of Session Equipment Utilized During Treatment: Gait belt;Rolling walker (2 wheels)  Activity Tolerance: Patient tolerated treatment well Patient left: in chair;with call bell/phone within reach;with chair alarm set  OT Visit Diagnosis: Unsteadiness on feet (R26.81);Other abnormalities of gait and mobility (R26.89);Pain;History of falling (Z91.81)                Time: 8780-8761 OT Time Calculation (min): 19 min Charges:  OT General Charges $OT Visit: 1 Visit OT Evaluation $OT Eval  Moderate Complexity: 1 Mod  Mliss HERO, OTR/L Acute Rehabilitation Services Office: [phone]   Kennth Mliss Helling 10/27/2023, 1:36 PM

## 2023-10-27 NOTE — Progress Notes (Signed)
 PROGRESS NOTE    Casey [name]  FMW:[id number] DOB: 1935-11-19 DOA: 10/25/2023 PCP: Kennyth Worth HERO, MD  Chief Complaint  Patient presents with   Fall    Brief Narrative:   Casey [name] is a 88 y.o. female with medical history significant of essential hypertension, type 2 diabetes, hyperlipidemia, generalized anxiety disorder, dementia, depression, chronic kidney disease stage IIIa, who sustained a mechanical fall at home and twisted her ankle.  Patient came in with severe pain and swelling of the left ankle.  Did not hit her head. She was seen and evaluated in the ER.  X-rays and later CT of the ankle showed trimalleolar fracture of the left ankle.  This was reduced in the ER.  Orthopedics consulted.  Patient admitted for further management.      Assessment & Plan:   Principal Problem:   Left trimalleolar fracture, closed, initial encounter Active Problems:   Dyslipidemia associated with type 2 diabetes mellitus (HCC)   GAD (generalized anxiety disorder)   Hypertension associated with diabetes (HCC)   Type 2 diabetes mellitus with microalbuminuria, without long-term current use of insulin  (HCC)   Dementia without behavioral disturbance (HCC)   Depression, major, single episode, moderate (HCC)   CKD stage 3a, GFR 45-59 ml/min (HCC)   Fall   Trimalleolar Fracture of the L Ankle s/p ORIF on 10/7 Now s/p ORIF of L trimalleolar ankle fracture with fixation of posterior lip, ORIF of syndesmosis, manual application of stress ankle syndesmosis under fluorosocopy Post op care per orthopedics, NWB in splint with ice/elevation - follow in office in 10-14 days for removal of sutures and transition to cam boot.  Weightbearing at 8 weeks.  Note high risk of nonunion and loss of reduction given diabetes and fracture/dislocation pattern of injury PT/OT Fall precautions   T2DM  Peripheral Neuropathy Last A1c 7.3 SSI, Accu-Cheks, hypoglycemic protocol, Januvia  (hospital substitute), home  gabapentin .   HTN Resume amlodipine   Hold lasix  for now  AKI on chronic kidney disease stage IIIa Creatinine bumped to 1.47 Continue gentle IV fluids Daily BMP   Hyperlipidemia Continue statin  Vitamin D  deficiency Continue ergocalciferol    Depression  Anxiety Continue prozac , wellbutrin , seroquel     Dementia Appears to be at baseline.     DVT prophylaxis: lovenox  Code Status: full Family Communication: none Disposition:   Status is: Inpatient Remains inpatient appropriate because: need for continued inpatient care   Consultants:  orthopedics  Procedures:  10/7 PROCEDURES:   OPEN REDUCTION INTERNAL FIXATION OF LEFT TRIMALLEOLAR ANKLE FRACTURE WITH FIXATION OF THE POSTERIOR LIP OPEN REDUCTION INTERNAL FIXATION OF THE SYNDESMOSIS MANUAL APPLICATION OF STRESS ANKLE SYNDESMOSIS UNDER FLUOROSCOPY  Antimicrobials:  Anti-infectives (From admission, onward)    Start     Dose/Rate Route Frequency Ordered Stop   10/26/23 1800  ceFAZolin  (ANCEF ) IVPB 2g/100 mL premix        2 g 200 mL/hr over 30 Minutes Intravenous Every 8 hours 10/26/23 1235 10/27/23 1414   10/26/23 0915  ceFAZolin  (ANCEF ) IVPB 2g/100 mL premix        2 g 200 mL/hr over 30 Minutes Intravenous On call to O.R. 10/26/23 0913 10/26/23 0959       Subjective: Denies any new complaints.  Objective: Vitals:   10/27/23 0006 10/27/23 0429 10/27/23 0712 10/27/23 1355  BP: (!) 138/59 (!) 139/59 139/61 (!) 140/57  Pulse: 76 72 66 76  Resp: 18 18 17 17   Temp: 97.8 F (36.6 C) (!) 97.4 F (36.3 C) 98.5 F (36.9  C) 97.6 F (36.4 C)  TempSrc:   Oral Oral  SpO2: 97% 97% 91% 100%  Weight:      Height:        Intake/Output Summary (Last 24 hours) at 10/27/2023 1900 Last data filed at 10/27/2023 1803 Gross per 24 hour  Intake 912.52 ml  Output 600 ml  Net 312.52 ml   Filed Weights   10/25/23 1618  Weight: 72.6 kg    Examination: General: NAD  Cardiovascular: S1, S2 present Respiratory:  CTAB Abdomen: Soft, nontender, nondistended, bowel sounds present Musculoskeletal: Left lower extremity with noted dressing Skin: Normal Psychiatry: Normal mood     Data Reviewed: I have personally reviewed following labs and imaging studies  CBC: Recent Labs  Lab 10/25/23 1758 10/26/23 0530 10/27/23 0354  WBC 9.9 11.2* 9.9  NEUTROABS 7.8*  --  6.2  HGB 13.4 11.1* 12.9  HCT 41.6 35.7* 40.1  MCV 87.9 98.6 88.3  PLT 234 250 235    Basic Metabolic Panel: Recent Labs  Lab 10/25/23 1758 10/26/23 0734 10/27/23 0354  NA 138 140 140  K 4.2 4.1 3.9  CL 102 105 104  CO2 21* 21* 20*  GLUCOSE 167* 124* 118*  BUN 11 12 23   CREATININE 1.10* 1.11* 1.47*  CALCIUM  9.8 9.6 9.0  MG  --   --  2.1  PHOS  --   --  4.1    GFR: Estimated Creatinine Clearance: 25.6 mL/min (A) (by C-G formula based on SCr of 1.47 mg/dL (H)).  Liver Function Tests: Recent Labs  Lab 10/26/23 0734 10/27/23 0354  AST 17 17  ALT 12 12  ALKPHOS 93 88  BILITOT 1.0 0.9  PROT 7.4 6.6  ALBUMIN 3.8 3.4*    CBG: Recent Labs  Lab 10/26/23 1719 10/26/23 2130 10/27/23 0618 10/27/23 1105 10/27/23 1604  GLUCAP 184* 151* 164* 157* 137*     Recent Results (from the past 240 hours)  Surgical pcr screen     Status: None   Collection Time: 10/26/23  9:13 AM   Specimen: Nasal Mucosa; Nasal Swab  Result Value Ref Range Status   MRSA, PCR NEGATIVE NEGATIVE Final   Staphylococcus aureus NEGATIVE NEGATIVE Final    Comment: (NOTE) The Xpert SA Assay (FDA approved for NASAL specimens in patients 69 years of age and older), is one component of a comprehensive surveillance program. It is not intended to diagnose infection nor to guide or monitor treatment. Performed at [facility], [address], [place], [place] [address]          Radiology Studies: DG Ankle Complete Left Result Date: 10/26/2023 CLINICAL DATA:  Fracture, postop. EXAM: LEFT ANKLE COMPLETE - 3+ VIEW COMPARISON:   Preoperative imaging FINDINGS: Lateral plate and screw fixation of distal fibular fracture with 2 syndesmotic screws. Screw traverses the distal tibial fracture. Improved fracture alignment. Ankle mortise is congruent. Overlying splint material limits osseous and soft tissue fine detail. IMPRESSION: ORIF of distal fibular and tibial fractures. Electronically Signed   By: Andrea Gasman M.D.   On: 10/26/2023 12:49   DG Ankle 2 Views Left Result Date: 10/26/2023 CLINICAL DATA:  Elective surgery. EXAM: LEFT ANKLE - 2 VIEW COMPARISON:  Radiograph yesterday FINDINGS: Four fluoroscopic spot views of the left ankle submitted from the operating room. Plate and screw fixation of distal fibular fracture with 2 syndesmotic screws. Screw traverses the distal tibia. Fluoroscopy time 43.3 seconds. Dose 1.02 mGy. IMPRESSION: Intraoperative fluoroscopy during left ankle ORIF. Electronically Signed  By: Andrea Gasman M.D.   On: 10/26/2023 12:24   DG C-Arm 1-60 Min-No Report Result Date: 10/26/2023 Fluoroscopy was utilized by the requesting physician.  No radiographic interpretation.   CT Ankle Left Wo Contrast Result Date: 10/25/2023 CLINICAL DATA:  Postreduction complex fracture of the left ankle. EXAM: CT OF THE LEFT ANKLE WITHOUT CONTRAST TECHNIQUE: Multidetector CT imaging of the left ankle was performed according to the standard protocol. Multiplanar CT image reconstructions were also generated. RADIATION DOSE REDUCTION: This exam was performed according to the departmental dose-optimization program which includes automated exposure control, adjustment of the mA and/or kV according to patient size and/or use of iterative reconstruction technique. COMPARISON:  Left ankle and tib-fib radiographs 10/25/2023 FINDINGS: Bones/Joint/Cartilage Trimalleolar fracture of the left ankle. Oblique fracture of the distal fibular diaphysis and metadiaphysis with about 4 mm lateral displacement of the distal fracture fragment.  Alignment appears near anatomic. Several small acute ossicles demonstrated inferior to the medial malleolus consistent with avulsion fractures. Coronal fracture of the posterior malleolus with mild superior displacement of the fracture fragment resulting in about a 3 mm cortical step-off at the articular surface. No residual dislocation demonstrated at the tibiotalar joint. There is a tiny loose body demonstrated centrally in the tibiotalar joint. Mild degenerative changes in the intertarsal joints. Old appearing ossicle at the Achilles calcaneal insertion consistent with sesamoid bone. Ligaments Suboptimally assessed by CT. Muscles and Tendons No intramuscular mass or hematoma demonstrated. Soft tissues Vascular calcifications in the runoff vessels. Diffuse soft tissue swelling/edema. No loculated collections. IMPRESSION: 1. Trimalleolar fractures of the left ankle as discussed above. No significant residual dislocation. 2. Tiny loose body suggested in the tibiotalar joint. 3. Diffuse soft tissue swelling. 4. Degenerative changes in the intertarsal joints. Electronically Signed   By: Elsie Gravely M.D.   On: 10/25/2023 20:31   DG Ankle Complete Left Result Date: 10/25/2023 CLINICAL DATA:  Recent fall with ankle deformity EXAM: LEFT ANKLE COMPLETE - 3+ VIEW COMPARISON:  Film from earlier in the same day. FINDINGS: There remains evidence of a trimalleolar fracture of the distal tibia and fibula. The talus is posteriorly and laterally dislocated with respect to the distal tibia. Tarsal degenerative changes are noted. Soft tissue swelling is seen. IMPRESSION: Trimalleolar fracture dislocation of the ankle with posterolateral dislocation of the talus with respect to the distal tibia. Electronically Signed   By: Oneil Devonshire M.D.   On: 10/25/2023 19:39        Scheduled Meds:  acetaminophen   1,000 mg Oral Q8H   amLODipine   5 mg Oral Daily   atorvastatin   10 mg Oral Daily   buPROPion   150 mg Oral Daily    [START ON 10/28/2023] enoxaparin  (LOVENOX ) injection  30 mg Subcutaneous Daily   FLUoxetine   20 mg Oral Daily   gabapentin   300 mg Oral QHS   insulin  aspart  0-15 Units Subcutaneous TID WC   insulin  aspart  0-5 Units Subcutaneous QHS   linagliptin  5 mg Oral Daily   pantoprazole   40 mg Oral Daily   QUEtiapine   50 mg Oral QHS   Vitamin D  (Ergocalciferol )  50,000 Units Oral Q7 days   Continuous Infusions:  lactated ringers  75 mL/hr at 10/27/23 0855     LOS: 2 days     Ama Mcmaster J Jennene Downie, MD [facility]   To contact the attending provider between 7A-7P or the covering provider during after hours 7P-7A, please log into the web site [email or web address] and access using universal   password for that web site. If you do not have the password, please call the hospital operator.  10/27/2023, 7:00 PM

## 2023-10-27 NOTE — Evaluation (Signed)
 Physical Therapy Evaluation Patient Details Name: Casey [name] MRN: [id number] DOB: 28-Jun-1935 Today's Date: 10/27/2023  History of Present Illness  Casey [name] is a 88 y.o. female admitted 10/25/23 after mechanical fall, sustaining left trimalleolar ankle fracture dislocation and syndesmotic disruption. Pt s/p ORIF 10/7. PMHx: essential HTN, HLD, T2DM, dementia with behavioral disturbance,  generalized anxiety disorder, and CKD IIIa.   Clinical Impression  Pt admitted with above diagnosis. PTA, pt was modI for functional mobility using a SPC and modI for ADLs/IADLs. She lives alone in a one story house with 1 STE. Pt reports family assist prn with her niece coming by to check on her everyday. Pt currently with functional limitations due to the deficits listed below (see PT Problem List). She required minA for bed mobility and min-modA for transfers using RW. She was unable to ambulate and adhere to LLE NWB despite multi-modal cues. Pt is currently limited by pain, decreased cognition and safety awareness secondary to dementia, weight-bearing status (LLE NWB), and reduced activity tolerance. Pt will benefit from acute skilled PT to increase her independence and safety with mobility to allow discharge. Recommend continued inpatient follow up therapy, <3 hours/day.    If plan is discharge home, recommend the following: A lot of help with walking and/or transfers;A lot of help with bathing/dressing/bathroom;Assistance with cooking/housework;Assist for transportation;Help with stairs or ramp for entrance   Can travel by private vehicle   No    Equipment Recommendations Wheelchair (measurements PT);Wheelchair cushion (measurements PT);BSC/3in1  Recommendations for Other Services       Functional Status Assessment Patient has had a recent decline in their functional status and demonstrates the ability to make significant improvements in function in a reasonable and predictable amount of time.      Precautions / Restrictions Precautions Precautions: None Recall of Precautions/Restrictions: Intact Required Braces or Orthoses: Splint/Cast Splint/Cast: Short Leg Splint LLE Splint/Cast - Date Prophylactic Dressing Applied (if applicable): 10/26/23 Restrictions Weight Bearing Restrictions Per Provider Order: Yes LLE Weight Bearing Per Provider Order: Non weight bearing      Mobility  Bed Mobility Overal bed mobility: Needs Assistance Bed Mobility: Supine to Sit     Supine to sit: Min assist     General bed mobility comments: Pt sat up on R side of bed with increased time. Assist to manage LLE. She scooted fwd with BUE support.    Transfers Overall transfer level: Needs assistance Equipment used: Rolling walker (2 wheels) Transfers: Sit to/from Stand, Bed to chair/wheelchair/BSC Sit to Stand: Min assist Stand pivot transfers: Mod assist         General transfer comment: Pt stood from lowest bed height. Cued proper hand placement. Pt tucked RLE underneath her. Instructed pt to keep LLE infront of her to limit weight acceptance. Powered up with minA. Transferred to recliner chair on R positioned touching bed. Assist to manuever RW and aid pt in pivot while maintaining LLE NWB at all times. She attempted to put weight through L foot, PT prevented by supporting underneath foot. Pt was able to turn on her R foot. Fair eccentric control.    Ambulation/Gait               General Gait Details: Unable  Stairs            Wheelchair Mobility     Tilt Bed    Modified Rankin (Stroke Patients Only)       Balance Overall balance assessment: Needs assistance Sitting-balance support: Bilateral upper extremity  supported, Feet supported Sitting balance-Leahy Scale: Fair Sitting balance - Comments: Pt sat EOB with CGA.   Standing balance support: Bilateral upper extremity supported, During functional activity, Reliant on assistive device for balance Standing  balance-Leahy Scale: Poor Standing balance comment: Pt dependent on RW                             Pertinent Vitals/Pain Pain Assessment Pain Assessment: 0-10 Pain Score: 9  Pain Location: L ankle Pain Descriptors / Indicators: Discomfort, Aching, Sore Pain Intervention(s): Monitored during session, Limited activity within patient's tolerance    Home Living Family/patient expects to be discharged to:: Private residence Living Arrangements: Alone Available Help at Discharge: Family;Friend(s);Available PRN/intermittently (Niece comes by everyday. Twin brothers can assist as needed.) Type of Home: House Home Access: Stairs to enter Entrance Stairs-Rails: Doctor, general practice of Steps: 1   Home Layout: One level Home Equipment: Pharmacist, hospital (2 wheels);Cane - single point      Prior Function Prior Level of Function : Independent/Modified Independent;Driving;History of Falls (last six months)             Mobility Comments: Ambulates using SPC. 1 fall leading to current admission. ADLs Comments: ModI with ADLs/IADLs. Manages her own medications. Cooks and clean.     Extremity/Trunk Assessment   Upper Extremity Assessment Upper Extremity Assessment: Defer to OT evaluation    Lower Extremity Assessment Lower Extremity Assessment: Generalized weakness;LLE deficits/detail LLE Deficits / Details: Pt POD 1 s/p L ankle ORIF. Pt in splint spanning ankle joint. Decreased hip and knee AROM d/t pain. Unable to assess ankle. Grossly 3-/5 strength. LLE: Unable to fully assess due to pain;Unable to fully assess due to immobilization LLE Coordination: decreased gross motor    Cervical / Trunk Assessment Cervical / Trunk Assessment: Normal  Communication   Communication Communication: No apparent difficulties    Cognition Arousal: Alert Behavior During Therapy: WFL for tasks assessed/performed   PT - Cognitive impairments: No family/caregiver  present to determine baseline, History of cognitive impairments (Hx of Dementia.)                       PT - Cognition Comments: Pt A,Ox4. Following commands: Intact       Cueing Cueing Techniques: Verbal cues     General Comments General comments (skin integrity, edema, etc.): VSS on RA    Exercises     Assessment/Plan    PT Assessment Patient needs continued PT services  PT Problem List Decreased strength;Decreased range of motion;Decreased activity tolerance;Decreased balance;Decreased mobility;Decreased knowledge of use of DME;Decreased safety awareness;Decreased knowledge of precautions       PT Treatment Interventions DME instruction;Gait training;Functional mobility training;Stair training;Therapeutic activities;Therapeutic exercise;Balance training;Patient/family education;Wheelchair mobility training    PT Goals (Current goals can be found in the Care Plan section)  Acute Rehab PT Goals Patient Stated Goal: Return Home PT Goal Formulation: With patient Time For Goal Achievement: 11/10/23 Potential to Achieve Goals: Fair    Frequency Min 2X/week     Co-evaluation               AM-PAC PT 6 Clicks Mobility  Outcome Measure Help needed turning from your back to your side while in a flat bed without using bedrails?: A Little Help needed moving from lying on your back to sitting on the side of a flat bed without using bedrails?: A Little Help needed moving to and from a  bed to a chair (including a wheelchair)?: A Lot Help needed standing up from a chair using your arms (e.g., wheelchair or bedside chair)?: A Little Help needed to walk in hospital room?: Total Help needed climbing 3-5 steps with a railing? : Total 6 Click Score: 13    End of Session Equipment Utilized During Treatment: Gait belt Activity Tolerance: Patient tolerated treatment well Patient left: in chair;with call bell/phone within reach;with chair alarm set Nurse Communication:  Mobility status PT Visit Diagnosis: Difficulty in walking, not elsewhere classified (R26.2);Muscle weakness (generalized) (M62.81);Unsteadiness on feet (R26.81);Pain Pain - Right/Left: Left Pain - part of body: Ankle and joints of foot    Time: 1101-1126 PT Time Calculation (min) (ACUTE ONLY): 25 min   Charges:   PT Evaluation $PT Eval Moderate Complexity: 1 Mod   PT General Charges $$ ACUTE PT VISIT: 1 Visit         Randall SAUNDERS, PT, DPT Acute Rehabilitation Services Office: [phone] Secure Chat Preferred  Delon CHRISTELLA Callander 10/27/2023, 12:36 PM

## 2023-10-27 NOTE — Plan of Care (Signed)
  Problem: Coping: Goal: Ability to adjust to condition or change in health will improve Outcome: Progressing   Problem: Health Behavior/Discharge Planning: Goal: Ability to manage health-related needs will improve Outcome: Progressing   Problem: Metabolic: Goal: Ability to maintain appropriate glucose levels will improve Outcome: Progressing   Problem: Education: Goal: Knowledge of General Education information will improve Description: Including pain rating scale, medication(s)/side effects and non-pharmacologic comfort measures Outcome: Progressing   Problem: Health Behavior/Discharge Planning: Goal: Ability to manage health-related needs will improve Outcome: Progressing

## 2023-10-27 NOTE — TOC Progression Note (Signed)
 Transition of Care [facility]) - Progression Note    Patient Details  Name: Casey [name] MRN: [id number] Date of Birth: [birth year]  Transition of [facility] CM/SW Contact  [name], LCSW Phone Number: 10/27/2023, 3:17 PM  Clinical Narrative:   CSW met with pt regarding PT recommendation for SNF.  Pt from home alone, reports her niece Abdul helps her at home, permission given to speak with niece and sister Erle.  Pt agreeable to SNF.  CSW spoke with Kenyatta, who confirms pt was at [facility] before, agreeable to SNF now, would like to see all available SNF options.  Referral sent out in hub for SNF. PASSR requires additional information.     Expected Discharge Plan: Skilled Nursing Facility Barriers to Discharge: Continued Medical Work up, SNF Pending bed offer               Expected Discharge Plan and Services   Discharge Planning Services: CM Consult Post Acute Care Choice: Skilled Nursing Facility Living arrangements for the past 2 months: Single Family Home                                       Social Drivers of Health (SDOH) Interventions SDOH Screenings   Food Insecurity: No Food Insecurity (10/26/2023)  Housing: Low Risk  (10/26/2023)  Transportation Needs: No Transportation Needs (10/26/2023)  Utilities: Not At Risk (10/26/2023)  Alcohol  Screen: Low Risk  (04/29/2023)  Depression (PHQ2-9): High Risk (09/30/2023)  Financial Resource Strain: Low Risk  (04/29/2023)  Physical Activity: Insufficiently Active (04/29/2023)  Social Connections: Moderately Isolated (10/26/2023)  Stress: Stress Concern Present (04/29/2023)  Tobacco Use: Low Risk  (10/26/2023)  Health Literacy: Adequate Health Literacy (04/29/2023)    Readmission Risk Interventions     No data to display

## 2023-10-27 NOTE — [place] FL2 (Signed)
 [place]  MEDICAID FL2 LEVEL OF CARE FORM     IDENTIFICATION  Patient Name: Casey [name] Birthdate: 1935/11/05 Sex: female Admission Date (Current Location): 10/25/2023  [place] and [place] Number:  [place] [id number] P Facility and Address:  [facility], [address] [place], [place] [address]      Provider Number: [id number]  Attending Physician Name and Address:  Donnamarie Lebron PARAS, MD  Relative Name and Phone Number:  Helen Ogren Sister [phone]    Current Level of Care: Hospital Recommended Level of Care: Skilled Nursing Facility Prior Approval Number:    Date Approved/Denied:   PASRR Number:    Discharge Plan: SNF    Current Diagnoses: Patient Active Problem List   Diagnosis Date Noted   Fall 10/25/2023   Left trimalleolar fracture, closed, initial encounter 10/25/2023   CKD stage 3a, GFR 45-59 ml/min (HCC) 07/02/2023   Leg edema 08/04/2022   Poor compliance with medication 06/11/2022   Chronic painful diabetic neuropathy (HCC) 06/11/2022   Debility 04/29/2022   Renal cyst, Bilateral 02/12/2022   Chronic pain 12/26/2021   Depression, major, single episode, moderate (HCC) 09/26/2021   Insomnia 09/26/2021   Dementia without behavioral disturbance (HCC) 01/03/2020   PUD (peptic ulcer disease) 07/17/2019   Vitamin B12 deficiency 07/14/2017   Osteoarthritis 07/07/2013   Type 2 diabetes mellitus with microalbuminuria, without long-term current use of insulin  (HCC) 05/26/2012   Long Q-T syndrome 11/26/2010   CYSTOCELE WITH INCOMPLETE UTERINE PROLAPSE 03/10/2010   MICROSCOPIC HEMATURIA 07/29/2009   Sinusitis, chronic 02/23/2008   BENIGN POSITIONAL VERTIGO 04/25/2007   Dyslipidemia associated with type 2 diabetes mellitus (HCC) 03/03/2007   GAD (generalized anxiety disorder) 03/03/2007   Hypertension associated with diabetes (HCC) 03/03/2007   Allergic rhinitis 03/03/2007   Osteoporosis 03/03/2007    Orientation RESPIRATION  BLADDER Height & Weight     Self, Time, Situation, Place  Normal   Weight: 160 lb (72.6 kg) Height:  5' 2 (157.5 cm)  BEHAVIORAL SYMPTOMS/MOOD NEUROLOGICAL BOWEL NUTRITION STATUS        Diet (see discharge summary)  AMBULATORY STATUS COMMUNICATION OF NEEDS Skin   Total Care Verbally Surgical wounds                       Personal Care Assistance Level of Assistance  Bathing, Feeding, Dressing, Total care Bathing Assistance: Maximum assistance Feeding assistance: Independent Dressing Assistance: Maximum assistance Total Care Assistance: Maximum assistance   Functional Limitations Info  Sight, Hearing, Speech Sight Info: Adequate Hearing Info: Impaired Speech Info: Adequate    SPECIAL CARE FACTORS FREQUENCY  PT (By licensed PT), OT (By licensed OT)     PT Frequency: 5x week OT Frequency: 5x week            Contractures Contractures Info: Not present    Additional Factors Info  Code Status, Allergies, Insulin  Sliding Scale Code Status Info: full Allergies Info: NKA   Insulin  Sliding Scale Info: novolog : see discharge summary       Current Medications (10/27/2023):  This is the current hospital active medication list Current Facility-Administered Medications  Medication Dose Route Frequency Provider Last Rate Last Admin   acetaminophen  (TYLENOL ) tablet 1,000 mg  1,000 mg Oral Q8H Perri DELENA Meliton Mickey., MD   1,000 mg at 10/27/23 1342   Followed by   NOREEN ON 10/28/2023] acetaminophen  (TYLENOL ) tablet 650 mg  650 mg Oral Q6H PRN Perri DELENA Meliton Mickey., MD       amLODipine  (  NORVASC ) tablet 5 mg  5 mg Oral Daily Perri DELENA Meliton Mickey., MD   5 mg at 10/27/23 9146   atorvastatin  (LIPITOR) tablet 10 mg  10 mg Oral Daily Perri DELENA Meliton Mickey., MD   10 mg at 10/27/23 9146   buPROPion  (WELLBUTRIN  XL) 24 hr tablet 150 mg  150 mg Oral Daily Perri DELENA Meliton Mickey., MD   150 mg at 10/27/23 0853   [START ON 10/28/2023] enoxaparin  (LOVENOX ) injection 30 mg  30 mg Subcutaneous  Daily Ezenduka, Nkeiruka J, MD       FLUoxetine  (PROZAC ) capsule 20 mg  20 mg Oral Daily Perri DELENA Meliton Mickey., MD   20 mg at 10/27/23 9146   gabapentin  (NEURONTIN ) capsule 300 mg  300 mg Oral QHS Perri DELENA Meliton Mickey., MD   300 mg at 10/26/23 2126   hydrALAZINE  (APRESOLINE ) injection 10 mg  10 mg Intravenous Q6H PRN Deward Eck, PA-C       insulin  aspart (novoLOG ) injection 0-15 Units  0-15 Units Subcutaneous TID WC Deward Eck, PA-C   3 Units at 10/27/23 1158   insulin  aspart (novoLOG ) injection 0-5 Units  0-5 Units Subcutaneous QHS Deward Eck, PA-C       lactated ringers  infusion   Intravenous Continuous Ezenduka, Nkeiruka J, MD 75 mL/hr at 10/27/23 0855 New Bag at 10/27/23 0855   linagliptin (TRADJENTA) tablet 5 mg  5 mg Oral Daily Perri DELENA Meliton Mickey., MD   5 mg at 10/27/23 0853   morphine  (PF) 2 MG/ML injection 2 mg  2 mg Intravenous Q2H PRN Deward Eck, PA-C       oxyCODONE  (Oxy IR/ROXICODONE ) immediate release tablet 2.5 mg  2.5 mg Oral Q4H PRN Perri DELENA Meliton Mickey., MD   2.5 mg at 10/27/23 1342   Or   oxyCODONE  (Oxy IR/ROXICODONE ) immediate release tablet 5 mg  5 mg Oral Q4H PRN Perri DELENA Meliton Mickey., MD       pantoprazole  (PROTONIX ) EC tablet 40 mg  40 mg Oral Daily Perri DELENA Meliton Mickey., MD   40 mg at 10/27/23 9146   QUEtiapine  (SEROQUEL ) tablet 50 mg  50 mg Oral QHS Perri DELENA Meliton Mickey., MD   50 mg at 10/26/23 2126   Vitamin D  (Ergocalciferol ) (DRISDOL) 1.25 MG (50000 UNIT) capsule 50,000 Units  50,000 Units Oral Q7 days Deward Eck, PA-C   50,000 Units at 10/27/23 1157     Discharge Medications: Please see discharge summary for a list of discharge medications.  Relevant Imaging Results:  Relevant Lab Results:   Additional Information SS# [id number]  [name], LCSW

## 2023-10-27 NOTE — Progress Notes (Signed)
 [facility] Progress Note  Patient ID: Casey [name] MRN: [id number] DOB/AGE: 08/08/1935 88 y.o.  Subjective:  Overall doing ok  Mild pain left ankle but tolerable  Cane and walker at baseline  Vitamin d  deficiency noted  ROS As above  Today's  total administered Morphine  Milligram Equivalents: 0 Yesterday's total administered Morphine  Milligram Equivalents: 30  Objective:   VITALS:   Vitals:   10/26/23 2104 10/27/23 0006 10/27/23 0429 10/27/23 0712  BP: (!) 141/64 (!) 138/59 (!) 139/59 139/61  Pulse: 76 76 72 66  Resp: 19 18 18 17   Temp: 97.9 F (36.6 C) 97.8 F (36.6 C) (!) 97.4 F (36.3 C) 98.5 F (36.9 C)  TempSrc:    Oral  SpO2: 98% 97% 97% 91%  Weight:      Height:        Estimated body mass index is 29.26 kg/m as calculated from the following:   Height as of this encounter: 5' 2 (1.575 m).   Weight as of this encounter: 72.6 kg.   Intake/Output      10/07 0701 10/08 0700 10/08 0701 10/09 0700   P.O. 120 120   I.V. (mL/kg) 900 (12.4)    Total Intake(mL/kg) 1020 (14) 120 (1.7)   Urine (mL/kg/hr) 600 (0.3)    Blood 5    Total Output 605    Net +415 +120          LABS  Results for orders placed or performed during the hospital encounter of 10/25/23 (from the past 24 hours)  Glucose, capillary     Status: Abnormal   Collection Time: 10/26/23 11:41 AM  Result Value Ref Range   Glucose-Capillary 136 (H) 70 - 99 mg/dL  Glucose, capillary     Status: Abnormal   Collection Time: 10/26/23  5:19 PM  Result Value Ref Range   Glucose-Capillary 184 (H) 70 - 99 mg/dL  Glucose, capillary     Status: Abnormal   Collection Time: 10/26/23  9:30 PM  Result Value Ref Range   Glucose-Capillary 151 (H) 70 - 99 mg/dL  VITAMIN D  25 Hydroxy (Vit-D Deficiency, Fractures)     Status: Abnormal   Collection Time: 10/27/23  3:54 AM  Result Value Ref Range   Vit D, 25-Hydroxy  16.07 (L) 30 - 100 ng/mL  CBC with Differential/Platelet     Status: Abnormal   Collection Time: 10/27/23  3:54 AM  Result Value Ref Range   WBC 9.9 4.0 - 10.5 K/uL   RBC 4.54 3.87 - 5.11 MIL/uL   Hemoglobin 12.9 12.0 - 15.0 g/dL   HCT 59.8 63.9 - 53.9 %   MCV 88.3 80.0 - 100.0 fL   MCH 28.4 26.0 - 34.0 pg   MCHC 32.2 30.0 - 36.0 g/dL   RDW 84.3 (H) 88.4 - 84.4 %   Platelets 235 150 - 400 K/uL   nRBC 0.0 0.0 - 0.2 %   Neutrophils Relative % 62 %   Neutro Abs 6.2 1.7 - 7.7 K/uL   Lymphocytes Relative 24 %   Lymphs Abs 2.4 0.7 - 4.0 K/uL   Monocytes Relative 12 %   Monocytes Absolute 1.2 (H) 0.1 - 1.0 K/uL   Eosinophils Relative 1 %   Eosinophils Absolute 0.1 0.0 - 0.5 K/uL   Basophils Relative 1 %  Basophils Absolute 0.1 0.0 - 0.1 K/uL   Immature Granulocytes 0 %   Abs Immature Granulocytes 0.04 0.00 - 0.07 K/uL  Comprehensive metabolic panel with GFR     Status: Abnormal   Collection Time: 10/27/23  3:54 AM  Result Value Ref Range   Sodium 140 135 - 145 mmol/L   Potassium 3.9 3.5 - 5.1 mmol/L   Chloride 104 98 - 111 mmol/L   CO2 20 (L) 22 - 32 mmol/L   Glucose, Bld 118 (H) 70 - 99 mg/dL   BUN 23 8 - 23 mg/dL   Creatinine, Ser 8.52 (H) 0.44 - 1.00 mg/dL   Calcium  9.0 8.9 - 10.3 mg/dL   Total Protein 6.6 6.5 - 8.1 g/dL   Albumin 3.4 (L) 3.5 - 5.0 g/dL   AST 17 15 - 41 U/L   ALT 12 0 - 44 U/L   Alkaline Phosphatase 88 38 - 126 U/L   Total Bilirubin 0.9 0.0 - 1.2 mg/dL   GFR, Estimated 35 (L) >60 mL/min   Anion gap 16 (H) 5 - 15  Magnesium      Status: None   Collection Time: 10/27/23  3:54 AM  Result Value Ref Range   Magnesium  2.1 1.7 - 2.4 mg/dL  Phosphorus     Status: None   Collection Time: 10/27/23  3:54 AM  Result Value Ref Range   Phosphorus 4.1 2.5 - 4.6 mg/dL  Glucose, capillary     Status: Abnormal   Collection Time: 10/27/23  6:18 AM  Result Value Ref Range   Glucose-Capillary 164 (H) 70 - 99 mg/dL     PHYSICAL EXAM:   Gen: resting comfortably in  bed, NAD, awake, pleasant  Lungs: unlabored Ext:      Left Lower Extremity  SLS in place and fitting well Dressing is clean, dry and intact  Extremity is warm  No pain out of proportion with passive stretching of toes   DPN, SPN, TN sensory functions are intact  EHL, FHL, lesser toe motor functions intact  + DP pulse   Assessment/Plan: 1 Day Post-Op   Principal Problem:   Left trimalleolar fracture, closed, initial encounter Active Problems:   Dyslipidemia associated with type 2 diabetes mellitus (HCC)   GAD (generalized anxiety disorder)   Hypertension associated with diabetes (HCC)   Type 2 diabetes mellitus with microalbuminuria, without long-term current use of insulin  (HCC)   Dementia without behavioral disturbance (HCC)   Depression, major, single episode, moderate (HCC)   CKD stage 3a, GFR 45-59 ml/min (HCC)   Fall   Anti-infectives (From admission, onward)    Start     Dose/Rate Route Frequency Ordered Stop   10/26/23 1800  ceFAZolin  (ANCEF ) IVPB 2g/100 mL premix        2 g 200 mL/hr over 30 Minutes Intravenous Every 8 hours 10/26/23 1235 10/27/23 2159   10/26/23 0915  ceFAZolin  (ANCEF ) IVPB 2g/100 mL premix        2 g 200 mL/hr over 30 Minutes Intravenous On call to O.R. 10/26/23 9086 10/26/23 0959     .  POD/HD#: 5  88 year old female ground-level fall with left trimalleolar ankle fracture dislocation and syndesmotic disruption  - Ground-level fall  - Left trimalleolar ankle fracture dislocation and syndesmotic disruption s/p ORIF   Nonweightbearing for 6 to 8 weeks  Splint for 2 weeks and then convert to cam boot for gentle range of motion  Therapy evaluations  Continue with ice and elevation   TOC consult for  SNF    - Pain management:  Multimodal, minimize narcotics  - Medical issues   Per primary   - DVT/PE prophylaxis:  Lovenox  while inpatient  Okay for aspirin  81 mg twice daily at discharge for 30 days - ID:   Perioperative  antibiotics  - Metabolic Bone Disease:  Vitamin D  deficiency noted   Supplement  - Activity:  As above  - Impediments to fracture healing:  Vitamin D  deficiency  CKD  Documented osteoporosis  - Dispo:  Ortho issues are stable  Okay to discharge to nursing facility once bed is available  Follow-up with orthopedics in 10 to 14 days for suture removal and follow-up x-rays   Francis MICAEL Mt, PA-C [phone] (C) 10/27/2023, 10:05 AM  [facility] [address] [place] [address] [phone] [phone] (F)    After 5pm and on the weekends please log on to Amion, go to orthopaedics and the look under the [facility] Call for the provider(s) on call. You can also call our office at [phone] and then follow the prompts to be connected to the call team.  Patient ID: Casey [name], female   DOB: Mar 08, 1935, 88 y.o.   MRN: [id number]

## 2023-10-28 DIAGNOSIS — S82852A Displaced trimalleolar fracture of left lower leg, initial encounter for closed fracture: Secondary | ICD-10-CM | POA: Diagnosis not present

## 2023-10-28 LAB — CBC WITH DIFFERENTIAL/PLATELET
Abs Immature Granulocytes: 0.05 K/uL (ref 0.00–0.07)
Basophils Absolute: 0.1 K/uL (ref 0.0–0.1)
Basophils Relative: 1 %
Eosinophils Absolute: 0.2 K/uL (ref 0.0–0.5)
Eosinophils Relative: 2 %
HCT: 37.7 % (ref 36.0–46.0)
Hemoglobin: 12.1 g/dL (ref 12.0–15.0)
Immature Granulocytes: 1 %
Lymphocytes Relative: 29 %
Lymphs Abs: 2.6 K/uL (ref 0.7–4.0)
MCH: 28.3 pg (ref 26.0–34.0)
MCHC: 32.1 g/dL (ref 30.0–36.0)
MCV: 88.3 fL (ref 80.0–100.0)
Monocytes Absolute: 0.8 K/uL (ref 0.1–1.0)
Monocytes Relative: 9 %
Neutro Abs: 5.2 K/uL (ref 1.7–7.7)
Neutrophils Relative %: 58 %
Platelets: 226 K/uL (ref 150–400)
RBC: 4.27 MIL/uL (ref 3.87–5.11)
RDW: 15.7 % — ABNORMAL HIGH (ref 11.5–15.5)
WBC: 8.9 K/uL (ref 4.0–10.5)
nRBC: 0 % (ref 0.0–0.2)

## 2023-10-28 LAB — BASIC METABOLIC PANEL WITH GFR
Anion gap: 12 (ref 5–15)
BUN: 18 mg/dL (ref 8–23)
CO2: 23 mmol/L (ref 22–32)
Calcium: 9 mg/dL (ref 8.9–10.3)
Chloride: 104 mmol/L (ref 98–111)
Creatinine, Ser: 1.29 mg/dL — ABNORMAL HIGH (ref 0.44–1.00)
GFR, Estimated: 40 mL/min — ABNORMAL LOW (ref >60)
Glucose, Bld: 136 mg/dL — ABNORMAL HIGH (ref 70–99)
Potassium: 4.1 mmol/L (ref 3.5–5.1)
Sodium: 139 mmol/L (ref 135–145)

## 2023-10-28 LAB — GLUCOSE, CAPILLARY
Glucose-Capillary: 152 mg/dL — ABNORMAL HIGH (ref 70–99)
Glucose-Capillary: 119 mg/dL — ABNORMAL HIGH (ref 70–99)
Glucose-Capillary: 180 mg/dL — ABNORMAL HIGH (ref 70–99)
Glucose-Capillary: 131 mg/dL — ABNORMAL HIGH (ref 70–99)

## 2023-10-28 MED ORDER — VITAMIN D (ERGOCALCIFEROL) 1.25 MG (50000 UNIT) PO CAPS: 50000.0000 [IU] | ORAL_CAPSULE | ORAL | Status: AC

## 2023-10-28 MED ORDER — ASPIRIN 81 MG PO TBEC: 81.0000 mg | DELAYED_RELEASE_TABLET | Freq: Two times a day (BID) | ORAL | 0 refills | Status: AC

## 2023-10-28 MED ORDER — MORPHINE SULFATE (PF) 2 MG/ML IV SOLN: 1.0000 mg | INTRAVENOUS | Status: DC | PRN

## 2023-10-28 MED ORDER — HYDROCODONE-ACETAMINOPHEN 5-325 MG PO TABS: 1.0000 | ORAL_TABLET | Freq: Three times a day (TID) | ORAL | 0 refills | Status: DC | PRN

## 2023-10-28 MED ORDER — HYDROMORPHONE HCL 1 MG/ML IJ SOLN: 0.5000 mg | INTRAMUSCULAR | Status: DC | PRN

## 2023-10-28 NOTE — Progress Notes (Signed)
 PROGRESS NOTE    Casey [name]  FMW:[id number] DOB: 05-29-1935 DOA: 10/25/2023 PCP: Kennyth Worth HERO, MD  Chief Complaint  Patient presents with   Fall    Brief Narrative:   Casey [name] is a 88 y.o. female with medical history significant of essential hypertension, type 2 diabetes, hyperlipidemia, generalized anxiety disorder, dementia, depression, chronic kidney disease stage IIIa, who sustained a mechanical fall at home and twisted her ankle.  Patient came in with severe pain and swelling of the left ankle.  Did not hit her head. She was seen and evaluated in the ER.  X-rays and later CT of the ankle showed trimalleolar fracture of the left ankle.  This was reduced in the ER.  Orthopedics consulted.  Patient admitted for further management.      Assessment & Plan:   Principal Problem:   Left trimalleolar fracture, closed, initial encounter Active Problems:   Dyslipidemia associated with type 2 diabetes mellitus (HCC)   GAD (generalized anxiety disorder)   Hypertension associated with diabetes (HCC)   Type 2 diabetes mellitus with microalbuminuria, without long-term current use of insulin  (HCC)   Dementia without behavioral disturbance (HCC)   Depression, major, single episode, moderate (HCC)   CKD stage 3a, GFR 45-59 ml/min (HCC)   Fall   Trimalleolar Fracture of the L Ankle s/p ORIF on 10/7 Now s/p ORIF of L trimalleolar ankle fracture with fixation of posterior lip, ORIF of syndesmosis, manual application of stress ankle syndesmosis under fluorosocopy Post op care per orthopedics, NWB in splint with ice/elevation - follow in office in 10-14 days for removal of sutures and transition to cam boot.  Weightbearing at 8 weeks.  Note high risk of nonunion and loss of reduction given diabetes and fracture/dislocation pattern of injury PT/OT Fall precautions   T2DM  Peripheral Neuropathy Last A1c 7.3 SSI, Accu-Cheks, hypoglycemic protocol, Januvia  (hospital substitute), home  gabapentin .   HTN Resume amlodipine   Hold lasix  for now  AKI on chronic kidney disease stage IIIa Creatinine bumped to 1.47 S/p gentle IV fluids Daily BMP   Hyperlipidemia Continue statin  Vitamin D  deficiency Continue ergocalciferol    Depression  Anxiety Continue prozac , wellbutrin , seroquel     Dementia Appears to be at baseline.     DVT prophylaxis: lovenox  Code Status: full Family Communication: none Disposition:   Status is: Inpatient Remains inpatient appropriate because: need for continued inpatient care   Consultants:  orthopedics  Procedures:  10/7 PROCEDURES:   OPEN REDUCTION INTERNAL FIXATION OF LEFT TRIMALLEOLAR ANKLE FRACTURE WITH FIXATION OF THE POSTERIOR LIP OPEN REDUCTION INTERNAL FIXATION OF THE SYNDESMOSIS MANUAL APPLICATION OF STRESS ANKLE SYNDESMOSIS UNDER FLUOROSCOPY  Antimicrobials:  Anti-infectives (From admission, onward)    Start     Dose/Rate Route Frequency Ordered Stop   10/26/23 1800  ceFAZolin  (ANCEF ) IVPB 2g/100 mL premix        2 g 200 mL/hr over 30 Minutes Intravenous Every 8 hours 10/26/23 1235 10/27/23 1414   10/26/23 0915  ceFAZolin  (ANCEF ) IVPB 2g/100 mL premix        2 g 200 mL/hr over 30 Minutes Intravenous On call to O.R. 10/26/23 0913 10/26/23 0959       Subjective: Denies any new complaints  Objective: Vitals:   10/27/23 1937 10/28/23 0341 10/28/23 0714 10/28/23 1324  BP: (!) 142/67 (!) 149/84 132/64 129/64  Pulse: 76 72 72 76  Resp: 18 18 17 16   Temp: 98.8 F (37.1 C) 98.3 F (36.8 C) 97.9 F (36.6 C) 98.4  F (36.9 C)  TempSrc: Oral Oral Oral Oral  SpO2: 100% 100% 100% 99%  Weight:      Height:        Intake/Output Summary (Last 24 hours) at 10/28/2023 1632 Last data filed at 10/28/2023 1210 Gross per 24 hour  Intake 1037.82 ml  Output 300 ml  Net 737.82 ml   Filed Weights   10/25/23 1618  Weight: 72.6 kg    Examination: General: NAD  Cardiovascular: S1, S2 present Respiratory:  CTAB Abdomen: Soft, nontender, nondistended, bowel sounds present Musculoskeletal: Left lower extremity with noted dressing Skin: Normal Psychiatry: Normal mood     Data Reviewed: I have personally reviewed following labs and imaging studies  CBC: Recent Labs  Lab 10/25/23 1758 10/26/23 0530 10/27/23 0354 10/28/23 0227  WBC 9.9 11.2* 9.9 8.9  NEUTROABS 7.8*  --  6.2 5.2  HGB 13.4 11.1* 12.9 12.1  HCT 41.6 35.7* 40.1 37.7  MCV 87.9 98.6 88.3 88.3  PLT 234 250 235 226    Basic Metabolic Panel: Recent Labs  Lab 10/25/23 1758 10/26/23 0734 10/27/23 0354 10/28/23 0227  NA 138 140 140 139  K 4.2 4.1 3.9 4.1  CL 102 105 104 104  CO2 21* 21* 20* 23  GLUCOSE 167* 124* 118* 136*  BUN 11 12 23 18   CREATININE 1.10* 1.11* 1.47* 1.29*  CALCIUM  9.8 9.6 9.0 9.0  MG  --   --  2.1  --   PHOS  --   --  4.1  --     GFR: Estimated Creatinine Clearance: 29.2 mL/min (A) (by C-G formula based on SCr of 1.29 mg/dL (H)).  Liver Function Tests: Recent Labs  Lab 10/26/23 0734 10/27/23 0354  AST 17 17  ALT 12 12  ALKPHOS 93 88  BILITOT 1.0 0.9  PROT 7.4 6.6  ALBUMIN 3.8 3.4*    CBG: Recent Labs  Lab 10/27/23 1604 10/27/23 2140 10/28/23 0613 10/28/23 1128 10/28/23 1616  GLUCAP 137* 133* 152* 119* 180*     Recent Results (from the past 240 hours)  Surgical pcr screen     Status: None   Collection Time: 10/26/23  9:13 AM   Specimen: Nasal Mucosa; Nasal Swab  Result Value Ref Range Status   MRSA, PCR NEGATIVE NEGATIVE Final   Staphylococcus aureus NEGATIVE NEGATIVE Final    Comment: (NOTE) The Xpert SA Assay (FDA approved for NASAL specimens in patients 7 years of age and older), is one component of a comprehensive surveillance program. It is not intended to diagnose infection nor to guide or monitor treatment. Performed at [facility], [address] [place], [place] [address]          Radiology Studies: No results found.       Scheduled  Meds:  amLODipine   5 mg Oral Daily   atorvastatin   10 mg Oral Daily   buPROPion   150 mg Oral Daily   enoxaparin  (LOVENOX ) injection  30 mg Subcutaneous Daily   FLUoxetine   20 mg Oral Daily   gabapentin   300 mg Oral QHS   insulin  aspart  0-15 Units Subcutaneous TID WC   insulin  aspart  0-5 Units Subcutaneous QHS   linagliptin  5 mg Oral Daily   pantoprazole   40 mg Oral Daily   QUEtiapine   50 mg Oral QHS   Vitamin D  (Ergocalciferol )  50,000 Units Oral Q7 days   Continuous Infusions:     LOS: 3 days     Lebron JINNY Cage, MD  [facility]   To contact the attending provider between 7A-7P or the covering provider during after hours 7P-7A, please log into the web site [email or web address] and access using universal Economy password for that web site. If you do not have the password, please call the hospital operator.  10/28/2023, 4:32 PM

## 2023-10-28 NOTE — Progress Notes (Signed)
 Orthopaedic Trauma Service Progress Note  Patient ID: Casey [name] MRN: [id number] DOB/AGE: 1936/01/15 88 y.o.  Subjective:  No complaints this am   Ortho issues stable   ROS As above  Today's  total administered Morphine  Milligram Equivalents: 0 Yesterday's total administered Morphine  Milligram Equivalents: 3.75  Objective:   VITALS:   Vitals:   10/27/23 1355 10/27/23 1937 10/28/23 0341 10/28/23 0714  BP: (!) 140/57 (!) 142/67 (!) 149/84 132/64  Pulse: 76 76 72 72  Resp: 17 18 18 17   Temp: 97.6 F (36.4 C) 98.8 F (37.1 C) 98.3 F (36.8 C) 97.9 F (36.6 C)  TempSrc: Oral Oral Oral Oral  SpO2: 100% 100% 100% 100%  Weight:      Height:        Estimated body mass index is 29.26 kg/m as calculated from the following:   Height as of this encounter: 5' 2 (1.575 m).   Weight as of this encounter: 72.6 kg.   Intake/Output      10/08 0701 10/09 0700 10/09 0701 10/10 0700   P.O. 240    I.V. (mL/kg) 577.8 (8)    IV Piggyback 100    Total Intake(mL/kg) 917.8 (12.6)    Urine (mL/kg/hr) 300 (0.2)    Blood     Total Output 300    Net +617.8           LABS  Results for orders placed or performed during the hospital encounter of 10/25/23 (from the past 24 hours)  Glucose, capillary     Status: Abnormal   Collection Time: 10/27/23 11:05 AM  Result Value Ref Range   Glucose-Capillary 157 (H) 70 - 99 mg/dL  Glucose, capillary     Status: Abnormal   Collection Time: 10/27/23  4:04 PM  Result Value Ref Range   Glucose-Capillary 137 (H) 70 - 99 mg/dL  Glucose, capillary     Status: Abnormal   Collection Time: 10/27/23  9:40 PM  Result Value Ref Range   Glucose-Capillary 133 (H) 70 - 99 mg/dL  CBC with Differential/Platelet     Status: Abnormal   Collection Time: 10/28/23  2:27 AM  Result Value Ref Range   WBC 8.9 4.0 - 10.5 K/uL   RBC 4.27 3.87 - 5.11 MIL/uL   Hemoglobin 12.1 12.0 -  15.0 g/dL   HCT 62.2 63.9 - 53.9 %   MCV 88.3 80.0 - 100.0 fL   MCH 28.3 26.0 - 34.0 pg   MCHC 32.1 30.0 - 36.0 g/dL   RDW 84.2 (H) 88.4 - 84.4 %   Platelets 226 150 - 400 K/uL   nRBC 0.0 0.0 - 0.2 %   Neutrophils Relative % 58 %   Neutro Abs 5.2 1.7 - 7.7 K/uL   Lymphocytes Relative 29 %   Lymphs Abs 2.6 0.7 - 4.0 K/uL   Monocytes Relative 9 %   Monocytes Absolute 0.8 0.1 - 1.0 K/uL   Eosinophils Relative 2 %   Eosinophils Absolute 0.2 0.0 - 0.5 K/uL   Basophils Relative 1 %   Basophils Absolute 0.1 0.0 - 0.1 K/uL   Immature Granulocytes 1 %   Abs Immature Granulocytes 0.05 0.00 - 0.07 K/uL  Basic metabolic panel with GFR     Status: Abnormal   Collection Time: 10/28/23  2:27 AM  Result  Value Ref Range   Sodium 139 135 - 145 mmol/L   Potassium 4.1 3.5 - 5.1 mmol/L   Chloride 104 98 - 111 mmol/L   CO2 23 22 - 32 mmol/L   Glucose, Bld 136 (H) 70 - 99 mg/dL   BUN 18 8 - 23 mg/dL   Creatinine, Ser 8.70 (H) 0.44 - 1.00 mg/dL   Calcium  9.0 8.9 - 10.3 mg/dL   GFR, Estimated 40 (L) >60 mL/min   Anion gap 12 5 - 15  Glucose, capillary     Status: Abnormal   Collection Time: 10/28/23  6:13 AM  Result Value Ref Range   Glucose-Capillary 152 (H) 70 - 99 mg/dL     PHYSICAL EXAM:   Gen: resting comfortably in bed, NAD, awake, pleasant, family present  Lungs: unlabored Ext:      Left Lower Extremity SLS in place and fitting well Dressing is clean, dry and intact             Extremity is warm  Swelling to foot improved              No pain out of proportion with passive stretching of toes              DPN, SPN, TN sensory functions are intact             EHL, FHL, lesser toe motor functions intact             + DP pulse  Assessment/Plan: 2 Days Post-Op   Principal Problem:   Left trimalleolar fracture, closed, initial encounter Active Problems:   Dyslipidemia associated with type 2 diabetes mellitus (HCC)   GAD (generalized anxiety disorder)   Hypertension associated  with diabetes (HCC)   Type 2 diabetes mellitus with microalbuminuria, without long-term current use of insulin  (HCC)   Dementia without behavioral disturbance (HCC)   Depression, major, single episode, moderate (HCC)   CKD stage 3a, GFR 45-59 ml/min (HCC)   Fall   Anti-infectives (From admission, onward)    Start     Dose/Rate Route Frequency Ordered Stop   10/26/23 1800  ceFAZolin  (ANCEF ) IVPB 2g/100 mL premix        2 g 200 mL/hr over 30 Minutes Intravenous Every 8 hours 10/26/23 1235 10/27/23 1414   10/26/23 0915  ceFAZolin  (ANCEF ) IVPB 2g/100 mL premix        2 g 200 mL/hr over 30 Minutes Intravenous On call to O.R. 10/26/23 9086 10/26/23 0959     .  POD/HD#: 15  88 year old female ground-level fall with left trimalleolar ankle fracture dislocation and syndesmotic disruption   - Ground-level fall   - Left trimalleolar ankle fracture dislocation and syndesmotic disruption s/p ORIF              Nonweightbearing for 6 to 8 weeks             Splint for 2 weeks and then convert to cam boot for gentle range of motion             Therapies              Continue with ice and elevation               TOC consult for SNF       - Pain management:             Multimodal, minimize narcotics   - Medical issues  Per primary   - DVT/PE prophylaxis:             Lovenox  while inpatient             Okay for aspirin  81 mg twice daily at discharge for 30 days - ID:              Perioperative antibiotics completed    - Metabolic Bone Disease:             Vitamin D  deficiency noted                         Supplement   - Activity:             As above   - Impediments to fracture healing:             Vitamin D  deficiency             CKD             Documented osteoporosis   - Dispo:             Ortho issues are stable             Okay to discharge to nursing facility once bed is available             Follow-up with orthopedics in 10 to 14 days for suture removal  and follow-up x-rays  Pain medication and aspirin  rx on physical chart     Francis MICAEL Mt, PA-C [phone] (C) 10/28/2023, 9:38 AM  [facility] [address] [place] [address] [phone] [phone] (F)    After 5pm and on the weekends please log on to Amion, go to orthopaedics and the look under the [facility] Call for the provider(s) on call. You can also call our office at [phone] and then follow the prompts to be connected to the call team.  Patient ID: Casey [name], female   DOB: 03-Jun-1935, 88 y.o.   MRN: [id number]

## 2023-10-28 NOTE — Progress Notes (Signed)
 Transition of Care [facility]) - CAGE-AID Screening   Patient Details  Name: Casey [name] MRN: [id number] Date of Birth: 06/22/1935   [name], RN Phone Number: 10/28/2023, 5:27 AM   Clinical Narrative:  Pt denies tobacco, etoh, drug usage. No resources needed  CAGE-AID Screening:    Have You Ever Felt You Ought to Cut Down on Your Drinking or Drug Use?: No Have People Annoyed You By Critizing Your Drinking Or Drug Use?: No Have You Felt Bad Or Guilty About Your Drinking Or Drug Use?: No      Substance Abuse Education Offered: No

## 2023-10-28 NOTE — Plan of Care (Signed)

## 2023-10-28 NOTE — Discharge Instructions (Signed)
 Orthopaedic Trauma Service Discharge Instructions   General Discharge Instructions  Orthopaedic Injuries:  Left ankle fracture dislocation treated with open reduction internal fixation using plate and screws  WEIGHT BEARING STATUS: Nonweightbearing left lower extremity for at least 6 weeks most likely 8 weeks  RANGE OF MOTION/ACTIVITY: No ankle motion as she is splinted.  Okay to move toes and knees to tolerance.  Activity as tolerated while maintaining weightbearing restrictions noted above.  Bone health: Labs showed severe vitamin D  deficiency.  Recommend 5000 IUs of vitamin D3 daily and 50,000 IUs of vitamin D2 weekly.  Will do a 28-month course of vitamin D2.  Vitamin D3 will likely be indefinite  Review the following resource for additional information regarding bone health  [email or web address]  Wound Care: Leave splint in place until office follow-up.  Please obtain a cam boot for the patient and send it with her to her first postoperative appointment  DVT/PE prophylaxis: Aspirin  81 mg every 12 hours for 30 days for blood clot prevention  Diet: as you were eating previously.  Can use over the counter stool softeners and bowel preparations, such as Miralax , to help with bowel movements.  Narcotics can be constipating.  Be sure to drink plenty of fluids  PAIN MEDICATION USE AND EXPECTATIONS  You have likely been given narcotic medications to help control your pain.  After a traumatic event that results in an fracture (broken bone) with or without surgery, it is ok to use narcotic pain medications to help control one's pain.  We understand that everyone responds to pain differently and each individual patient will be evaluated on a regular basis for the continued need for narcotic medications. Ideally, narcotic medication use should last no more than 6-8 weeks (coinciding with fracture healing).   As a patient it is your responsibility as well to monitor  narcotic medication use and report the amount and frequency you use these medications when you come to your office visit.   We would also advise that if you are using narcotic medications, you should take a dose prior to therapy to maximize you participation.  IF YOU ARE ON NARCOTIC MEDICATIONS IT IS NOT PERMISSIBLE TO OPERATE A MOTOR VEHICLE (MOTORCYCLE/CAR/TRUCK/MOPED) OR HEAVY MACHINERY DO NOT MIX NARCOTICS WITH OTHER CNS (CENTRAL NERVOUS SYSTEM) DEPRESSANTS SUCH AS ALCOHOL    POST-OPERATIVE OPIOID TAPER INSTRUCTIONS: It is important to wean off of your opioid medication as soon as possible. If you do not need pain medication after your surgery it is ok to stop day one. Opioids include: Codeine, Hydrocodone (Norco, Vicodin), Oxycodone (Percocet, oxycontin ) and hydromorphone  amongst others.  Long term and even short term use of opiods can cause: Increased pain response Dependence Constipation Depression Respiratory depression And more.  Withdrawal symptoms can include Flu like symptoms Nausea, vomiting And more Techniques to manage these symptoms Hydrate well Eat regular healthy meals Stay active Use relaxation techniques(deep breathing, meditating, yoga) Do Not substitute Alcohol  to help with tapering If you have been on opioids for less than two weeks and do not have pain than it is ok to stop all together.  Plan to wean off of opioids This plan should start within one week post op of your fracture surgery  Maintain the same interval or time between taking each dose and first decrease the dose.  Cut the total daily intake of opioids by one tablet each day Next start to increase the time between doses. The last dose that should be eliminated is the evening dose.  STOP SMOKING OR USING NICOTINE PRODUCTS!!!!  As discussed nicotine severely impairs your body's ability to heal surgical and traumatic wounds but also impairs bone healing.  Wounds and bone heal by forming microscopic  blood vessels (angiogenesis) and nicotine is a vasoconstrictor (essentially, shrinks blood vessels).  Therefore, if vasoconstriction occurs to these microscopic blood vessels they essentially disappear and are unable to deliver necessary nutrients to the healing tissue.  This is one modifiable factor that you can do to dramatically increase your chances of healing your injury.    (This means no smoking, no nicotine gum, patches, etc)  DO NOT USE NONSTEROIDAL ANTI-INFLAMMATORY DRUGS (NSAID'S)  Using products such as Advil (ibuprofen), Aleve  (naproxen ), Motrin (ibuprofen) for additional pain control during fracture healing can delay and/or prevent the healing response.  If you would like to take over the counter (OTC) medication, Tylenol  (acetaminophen ) is ok.  However, some narcotic medications that are given for pain control contain acetaminophen  as well. Therefore, you should not exceed more than 4000 mg of tylenol  in a day if you do not have liver disease.  Also note that there are may OTC medicines, such as cold medicines and allergy medicines that my contain tylenol  as well.  If you have any questions about medications and/or interactions please ask your doctor/PA or your pharmacist.      ICE AND ELEVATE INJURED/OPERATIVE EXTREMITY  Using ice and elevating the injured extremity above your heart can help with swelling and pain control.  Icing in a pulsatile fashion, such as 20 minutes on and 20 minutes off, can be followed.    Do not place ice directly on skin. Make sure there is a barrier between to skin and the ice pack.    Using frozen items such as frozen peas works well as the conform nicely to the are that needs to be iced.  USE AN ACE WRAP OR TED HOSE FOR SWELLING CONTROL  In addition to icing and elevation, Ace wraps or TED hose are used to help limit and resolve swelling.  It is recommended to use Ace wraps or TED hose until you are informed to stop.    When using Ace Wraps start the  wrapping distally (farthest away from the body) and wrap proximally (closer to the body)   Example: If you had surgery on your leg and you do not have a splint on, start the ace wrap at the toes and work your way up to the thigh        If you had surgery on your upper extremity and do not have a splint on, start the ace wrap at your fingers and work your way up to the upper arm  IF YOU ARE IN A SPLINT OR CAST DO NOT REMOVE IT FOR ANY REASON   If your splint gets wet for any reason please contact the office immediately. You may shower in your splint or cast as long as you keep it dry.  This can be done by wrapping in a cast cover or garbage back (or similar)  Do Not stick any thing down your splint or cast such as pencils, money, or hangers to try and scratch yourself with.  If you feel itchy take benadryl  as prescribed on the bottle for itching  IF YOU ARE IN A CAM BOOT (BLACK BOOT)  You may remove boot periodically. Perform daily dressing changes as noted below.  Wash the liner of the boot regularly and wear a sock when wearing the boot.  It is recommended that you sleep in the boot until told otherwise    Call office for the following: Temperature greater than 101F Persistent nausea and vomiting Severe uncontrolled pain Redness, tenderness, or signs of infection (pain, swelling, redness, odor or green/yellow discharge around the site) Difficulty breathing, headache or visual disturbances Hives Persistent dizziness or light-headedness Extreme fatigue Any other questions or concerns you may have after discharge  In an emergency, call 911 or go to an Emergency Department at a nearby hospital  HELPFUL INFORMATION  If you had a block, it will wear off between 8-24 hrs postop typically.  This is period when your pain may go from nearly zero to the pain you would have had postop without the block.  This is an abrupt transition but nothing dangerous is happening.  You may take an extra dose of  narcotic when this happens.  You should wean off your narcotic medicines as soon as you are able.  Most patients will be off or using minimal narcotics before their first postop appointment.   We suggest you use the pain medication the first night prior to going to bed, in order to ease any pain when the anesthesia wears off. You should avoid taking pain medications on an empty stomach as it will make you nauseous.  Do not drink alcoholic beverages or take illicit drugs when taking pain medications.  In most states it is against the law to drive while you are in a splint or sling.  And certainly against the law to drive while taking narcotics.  You may return to work/school in the next couple of days when you feel up to it.   Pain medication may make you constipated.  Below are a few solutions to try in this order: Decrease the amount of pain medication if you aren't having pain. Drink lots of decaffeinated fluids. Drink prune juice and/or each dried prunes  If the first 3 don't work start with additional solutions Take Colace - an over-the-counter stool softener Take Senokot - an over-the-counter laxative Take Miralax  - a stronger over-the-counter laxative     CALL THE OFFICE WITH ANY QUESTIONS OR CONCERNS: [phone]   VISIT OUR WEBSITE FOR ADDITIONAL INFORMATION: [email or web address]

## 2023-10-28 NOTE — TOC Progression Note (Addendum)
 Transition of Care [facility]) - Progression Note    Patient Details  Name: Casey [name] MRN: [id number] Date of Birth: Aug 29, 1935  [facility]) CM/SW Contact  [name], LCSW Phone Number: 10/28/2023, 10:20 AM  Clinical Narrative:   Bed offers provided to pt and niece Kenyetta on medicare choice document.  They will review.   1040: They will accept offer at [facility].  CSW confirmed with [facility].  SNF auth request submitted in [facility] and approved: [id number], 5 days: 10/9-10/13.  PASSR remains pending.   1415: PASSR remains pending.  Expected Discharge Plan: Skilled Nursing Facility Barriers to Discharge: Continued Medical Work up, SNF Pending bed offer               Expected Discharge Plan and Services   Discharge Planning Services: CM Consult Post Acute Care Choice: Skilled Nursing Facility Living arrangements for the past 2 months: Single Family Home                                       Social Drivers of Health (SDOH) Interventions SDOH Screenings   Food Insecurity: No Food Insecurity (10/26/2023)  Housing: Low Risk  (10/26/2023)  Transportation Needs: No Transportation Needs (10/26/2023)  Utilities: Not At Risk (10/26/2023)  Alcohol  Screen: Low Risk  (04/29/2023)  Depression (PHQ2-9): High Risk (09/30/2023)  Financial Resource Strain: Low Risk  (04/29/2023)  Physical Activity: Insufficiently Active (04/29/2023)  Social Connections: Moderately Isolated (10/26/2023)  Stress: Stress Concern Present (04/29/2023)  Tobacco Use: Low Risk  (10/26/2023)  Health Literacy: Adequate Health Literacy (04/29/2023)    Readmission Risk Interventions     No data to display

## 2023-10-29 ENCOUNTER — Encounter: Payer: Self-pay | Admitting: Licensed Clinical Social Worker

## 2023-10-29 DIAGNOSIS — S82852A Displaced trimalleolar fracture of left lower leg, initial encounter for closed fracture: Secondary | ICD-10-CM | POA: Diagnosis not present

## 2023-10-29 LAB — CBC WITH DIFFERENTIAL/PLATELET
Abs Immature Granulocytes: 0.06 K/uL (ref 0.00–0.07)
Basophils Absolute: 0.1 K/uL (ref 0.0–0.1)
Basophils Relative: 1 %
Eosinophils Absolute: 0.2 K/uL (ref 0.0–0.5)
Eosinophils Relative: 3 %
HCT: 38.4 % (ref 36.0–46.0)
Hemoglobin: 12.5 g/dL (ref 12.0–15.0)
Immature Granulocytes: 1 %
Lymphocytes Relative: 33 %
Lymphs Abs: 2.2 K/uL (ref 0.7–4.0)
MCH: 28.4 pg (ref 26.0–34.0)
MCHC: 32.6 g/dL (ref 30.0–36.0)
MCV: 87.3 fL (ref 80.0–100.0)
Monocytes Absolute: 0.7 K/uL (ref 0.1–1.0)
Monocytes Relative: 10 %
Neutro Abs: 3.5 K/uL (ref 1.7–7.7)
Neutrophils Relative %: 52 %
Platelets: 276 K/uL (ref 150–400)
RBC: 4.4 MIL/uL (ref 3.87–5.11)
RDW: 15.6 % — ABNORMAL HIGH (ref 11.5–15.5)
WBC: 6.8 K/uL (ref 4.0–10.5)
nRBC: 0 % (ref 0.0–0.2)

## 2023-10-29 LAB — BASIC METABOLIC PANEL WITH GFR
Anion gap: 12 (ref 5–15)
BUN: 17 mg/dL (ref 8–23)
CO2: 22 mmol/L (ref 22–32)
Calcium: 8.6 mg/dL — ABNORMAL LOW (ref 8.9–10.3)
Chloride: 103 mmol/L (ref 98–111)
Creatinine, Ser: 1.01 mg/dL — ABNORMAL HIGH (ref 0.44–1.00)
GFR, Estimated: 54 mL/min — ABNORMAL LOW (ref >60)
Glucose, Bld: 121 mg/dL — ABNORMAL HIGH (ref 70–99)
Potassium: 3.6 mmol/L (ref 3.5–5.1)
Sodium: 137 mmol/L (ref 135–145)

## 2023-10-29 LAB — GLUCOSE, CAPILLARY: Glucose-Capillary: 139 mg/dL — ABNORMAL HIGH (ref 70–99)

## 2023-10-29 NOTE — TOC Progression Note (Addendum)
 Transition of Care [facility]) - Progression Note    Patient Details  Name: Casey [name] MRN: [id number] Date of Birth: 1935-06-05  Transition of Care [facility]) CM/SW Contact  [name], LCSW Phone Number: 10/29/2023, 8:22 AM  Clinical Narrative:   PASSR received: [id number].  CSW confirmed with [facility] that they can receive pt today.   MD informed.     Expected Discharge Plan: Skilled Nursing Facility Barriers to Discharge: Continued Medical Work up, SNF Pending bed offer               Expected Discharge Plan and Services   Discharge Planning Services: CM Consult Post Acute Care Choice: Skilled Nursing Facility Living arrangements for the past 2 months: Single Family Home                                       Social Drivers of Health (SDOH) Interventions SDOH Screenings   Food Insecurity: No Food Insecurity (10/26/2023)  Housing: Low Risk  (10/26/2023)  Transportation Needs: No Transportation Needs (10/26/2023)  Utilities: Not At Risk (10/26/2023)  Alcohol  Screen: Low Risk  (04/29/2023)  Depression (PHQ2-9): High Risk (09/30/2023)  Financial Resource Strain: Low Risk  (04/29/2023)  Physical Activity: Insufficiently Active (04/29/2023)  Social Connections: Moderately Isolated (10/26/2023)  Stress: Stress Concern Present (04/29/2023)  Tobacco Use: Low Risk  (10/26/2023)  Health Literacy: Adequate Health Literacy (04/29/2023)    Readmission Risk Interventions     No data to display

## 2023-10-29 NOTE — Plan of Care (Signed)

## 2023-10-29 NOTE — TOC Transition Note (Signed)
 Transition of Care [facility]) - Discharge Note   Patient Details  Name: Casey [name] MRN: [id number] Date of Birth: [birth year]  Transition of Care [facility]) CM/SW Contact:  [name], LCSW Phone Number: 10/29/2023, 10:20 AM   Clinical Narrative:   Pt discharging to [facility].  RN call report to [phone].  PTAR called [phone].     Final next level of care: Skilled Nursing Facility Barriers to Discharge: Barriers Resolved   Patient Goals and CMS Choice Patient states their goals for this hospitalization and ongoing recovery are:: walk   Choice offered to / list presented to : Patient (niece Kenyatta)      Discharge Placement              Patient [facility] bed at: [facility] Patient to be transferred to facility by: ptar Name of family member notified: daughter Mahalia Patient and family notified of of transfer: 10/29/23  Discharge Plan and Services Additional resources added to the After Visit Summary for     Discharge Planning Services: CM Consult Post Acute Care Choice: Skilled Nursing Facility                               Social Drivers of Health (SDOH) Interventions SDOH Screenings   Food Insecurity: No Food Insecurity (10/26/2023)  Housing: Low Risk  (10/26/2023)  Transportation Needs: No Transportation Needs (10/26/2023)  Utilities: Not At Risk (10/26/2023)  Alcohol  Screen: Low Risk  (04/29/2023)  Depression (PHQ2-9): High Risk (09/30/2023)  Financial Resource Strain: Low Risk  (04/29/2023)  Physical Activity: Insufficiently Active (04/29/2023)  Social Connections: Moderately Isolated (10/26/2023)  Stress: Stress Concern Present (04/29/2023)  Tobacco Use: Low Risk  (10/26/2023)  Health Literacy: Adequate Health Literacy (04/29/2023)     Readmission Risk Interventions     No data to display

## 2023-10-29 NOTE — Progress Notes (Signed)
 Casey [name] to be D/C'd Skilled nursing facility per MD order.  Discussed with the patient and all questions fully answered.  IV catheter discontinued intact. Site without signs and symptoms of complications. Dressing and pressure applied.  An After Visit Summary was printed and placed in discharged packet along with signed prescriptions.   Attempted to call report to [facility], nurse unavailable for report.  Patient escorted via stretcher, and D/C to [facility] via non emergency ambulance.  [name] 10/29/2023 10:31 AM

## 2023-10-29 NOTE — Discharge Summary (Signed)
 Physician Discharge Summary   Patient: Casey [name] MRN: [id number] DOB: [birth year]  Admit date:     10/25/2023  Discharge date: 10/29/23  Discharge Physician: Casey [name]   PCP: Casey Worth HERO, MD   Recommendations at discharge:   Follow up with PCP in 1 week Follow up with Orthopedics  Discharge Diagnoses: Principal Problem:   Left trimalleolar fracture, closed, initial encounter Active Problems:   Dyslipidemia associated with type 2 diabetes mellitus (HCC)   GAD (generalized anxiety disorder)   Hypertension associated with diabetes (HCC)   Type 2 diabetes mellitus with microalbuminuria, without long-term current use of insulin  (HCC)   Dementia without behavioral disturbance (HCC)   Depression, major, single episode, moderate (HCC)   CKD stage 3a, GFR 45-59 ml/min [facility])   [facility]: Casey [name] is a 88 y.o. female with medical history significant of essential hypertension, type 2 diabetes, hyperlipidemia, generalized anxiety disorder, dementia, depression, chronic kidney disease stage IIIa, who sustained a mechanical fall at home and twisted her ankle.  Patient came in with severe pain and swelling of the left ankle.  Did not hit her head. She was seen and evaluated in the ER.  X-rays and later CT of the ankle showed trimalleolar fracture of the left ankle.  This was reduced in the ER.  Orthopedics consulted.  Patient admitted for further management.     Today, pt denies any new complaints. Stable to d/c to SNF for further rehab needs.    Assessment and Plan: Trimalleolar Fracture of the L Ankle s/p ORIF on 10/7 Now s/p ORIF of L trimalleolar ankle fracture with fixation of posterior lip, ORIF of syndesmosis, manual application of stress ankle syndesmosis under fluorosocopy Post op care per orthopedics, NWB in splint with ice/elevation - follow in office in 10-14 days for removal of sutures and transition to cam boot.  Weightbearing at 8 weeks.   Note high risk of nonunion and loss of reduction given diabetes and fracture/dislocation pattern of injury PT/OT Fall precautions   T2DM  Peripheral Neuropathy Last A1c 7.3 Continue home regimen, gabapentin .   HTN Continue amlodipine     AKI on chronic kidney disease stage IIIa Creatinine bumped to 1.47-->1.01 S/p gentle IV fluids   Hyperlipidemia Continue statin   Vitamin D  deficiency Continue ergocalciferol    Depression  Anxiety Continue prozac , wellbutrin , seroquel     Dementia Appears to be at baseline.   Pain control - [place]  Controlled Substance Reporting System database was reviewed. and patient was instructed, not to drive, operate heavy machinery, perform activities at heights, swimming or participation in water  activities or provide baby-sitting services while on Pain, Sleep and Anxiety Medications; until their outpatient Physician has advised to do so again. Also recommended to not to take more than prescribed Pain, Sleep and Anxiety Medications.     Consultants: Orthopedics Procedures performed: ORIF on 10/7 Disposition: Skilled nursing facility Diet recommendation:  Cardiac and Carb modified diet    DISCHARGE MEDICATION: Allergies as of 10/29/2023   No Known Allergies      Medication List     TAKE these medications    acetaminophen  500 MG tablet Commonly known as: TYLENOL  Take 1,000 mg by mouth every 6 (six) hours as needed.   amLODipine  2.5 MG tablet Commonly known as: NORVASC  Take 2 tablets (5 mg total) by mouth daily.   aspirin  EC 81 MG tablet Take 1 tablet (81 mg total) by mouth every 12 (twelve) hours. Swallow whole.  atorvastatin  10 MG tablet Commonly known as: LIPITOR Take 1 tablet (10 mg total) by mouth daily.   buPROPion  150 MG 24 hr tablet Commonly known as: Wellbutrin  XL Take 1 tablet (150 mg total) by mouth daily.   empagliflozin  25 MG Tabs tablet Commonly known as: Jardiance  Take 1 tablet (25 mg total) by mouth  daily.   FLUoxetine  20 MG tablet Commonly known as: PROZAC  Take 1 tablet (20 mg total) by mouth daily.   fluticasone  50 MCG/ACT nasal spray Commonly known as: FLONASE  Place 2 sprays into both nostrils daily.   furosemide  20 MG tablet Commonly known as: LASIX  Take 1 tablet (20 mg total) by mouth daily.   gabapentin  300 MG capsule Commonly known as: NEURONTIN  Take 1 capsule (300 mg total) by mouth at bedtime.   HYDROcodone -acetaminophen  5-325 MG tablet Commonly known as: NORCO/VICODIN Take 1 tablet by mouth every 8 (eight) hours as needed for moderate pain (pain score 4-6) or severe pain (pain score 7-10). What changed:  when to take this reasons to take this   pantoprazole  40 MG tablet Commonly known as: PROTONIX  Take 1 tablet (40 mg total) by mouth daily.   potassium chloride  SA 20 MEQ tablet Commonly known as: KLOR-CON  M Take 20 mEq by mouth daily.   QUEtiapine  50 MG tablet Commonly known as: SEROquel  Take 1 tablet (50 mg total) by mouth at bedtime.   Refresh Optive 1-0.9 % Gel Generic drug: Carboxymethylcellul-Glycerin  Place 1 drop into both eyes daily.   sitaGLIPtin  100 MG tablet Commonly known as: Januvia  Take 1 tablet (100 mg total) by mouth daily.   Vitamin D  (Ergocalciferol ) 1.25 MG (50000 UNIT) Caps capsule Commonly known as: DRISDOL Take 1 capsule (50,000 Units total) by mouth every 7 (seven) days. Start taking on: November 03, 2023        Follow-up Information     Casey Sharper, MD. Schedule an appointment as soon as possible for a visit in 2 week(s).   Specialty: Orthopedic Surgery Contact information: [address] [place] [address] [phone]         Casey Worth HERO, MD. Schedule an appointment as soon as possible for a visit in 1 week(s).   Specialty: Family Medicine Contact information: [address] [place] [address] [phone]                Discharge Exam: Casey [name]   10/25/23 1618  Weight: 72.6  kg   General: NAD  Cardiovascular: S1, S2 present Respiratory: CTAB Abdomen: Soft, nontender, nondistended, bowel sounds present Musculoskeletal: LLE with noted dressing intact Skin: As above Psychiatry: Normal mood   Condition at discharge: stable  The results of significant diagnostics from this hospitalization (including imaging, microbiology, ancillary and laboratory) are listed below for reference.   Imaging Studies: DG Ankle Complete Left Result Date: 10/26/2023 CLINICAL DATA:  Fracture, postop. EXAM: LEFT ANKLE COMPLETE - 3+ VIEW COMPARISON:  Preoperative imaging FINDINGS: Lateral plate and screw fixation of distal fibular fracture with 2 syndesmotic screws. Screw traverses the distal tibial fracture. Improved fracture alignment. Ankle mortise is congruent. Overlying splint material limits osseous and soft tissue fine detail. IMPRESSION: ORIF of distal fibular and tibial fractures. Electronically Signed   By: Andrea Gasman M.D.   On: 10/26/2023 12:49   DG Ankle 2 Views Left Result Date: 10/26/2023 CLINICAL DATA:  Elective surgery. EXAM: LEFT ANKLE - 2 VIEW COMPARISON:  Radiograph yesterday FINDINGS: Four fluoroscopic spot views of the left ankle submitted from the operating room. Plate  and screw fixation of distal fibular fracture with 2 syndesmotic screws. Screw traverses the distal tibia. Fluoroscopy time 43.3 seconds. Dose 1.02 mGy. IMPRESSION: Intraoperative fluoroscopy during left ankle ORIF. Electronically Signed   By: Andrea Gasman M.D.   On: 10/26/2023 12:24   DG C-Arm 1-60 Min-No Report Result Date: 10/26/2023 Fluoroscopy was utilized by the requesting physician.  No radiographic interpretation.   CT Ankle Left Wo Contrast Result Date: 10/25/2023 CLINICAL DATA:  Postreduction complex fracture of the left ankle. EXAM: CT OF THE LEFT ANKLE WITHOUT CONTRAST TECHNIQUE: Multidetector CT imaging of the left ankle was performed according to the standard protocol. Multiplanar  CT image reconstructions were also generated. RADIATION DOSE REDUCTION: This exam was performed according to the departmental dose-optimization program which includes automated exposure control, adjustment of the mA and/or kV according to patient size and/or use of iterative reconstruction technique. COMPARISON:  Left ankle and tib-fib radiographs 10/25/2023 FINDINGS: Bones/Joint/Cartilage Trimalleolar fracture of the left ankle. Oblique fracture of the distal fibular diaphysis and metadiaphysis with about 4 mm lateral displacement of the distal fracture fragment. Alignment appears near anatomic. Several small acute ossicles demonstrated inferior to the medial malleolus consistent with avulsion fractures. Coronal fracture of the posterior malleolus with mild superior displacement of the fracture fragment resulting in about a 3 mm cortical step-off at the articular surface. No residual dislocation demonstrated at the tibiotalar joint. There is a tiny loose body demonstrated centrally in the tibiotalar joint. Mild degenerative changes in the intertarsal joints. Old appearing ossicle at the Achilles calcaneal insertion consistent with sesamoid bone. Ligaments Suboptimally assessed by CT. Muscles and Tendons No intramuscular mass or hematoma demonstrated. Soft tissues Vascular calcifications in the runoff vessels. Diffuse soft tissue swelling/edema. No loculated collections. IMPRESSION: 1. Trimalleolar fractures of the left ankle as discussed above. No significant residual dislocation. 2. Tiny loose body suggested in the tibiotalar joint. 3. Diffuse soft tissue swelling. 4. Degenerative changes in the intertarsal joints. Electronically Signed   By: Elsie Gravely M.D.   On: 10/25/2023 20:31   DG Ankle Complete Left Result Date: 10/25/2023 CLINICAL DATA:  Recent fall with ankle deformity EXAM: LEFT ANKLE COMPLETE - 3+ VIEW COMPARISON:  Film from earlier in the same day. FINDINGS: There remains evidence of a  trimalleolar fracture of the distal tibia and fibula. The talus is posteriorly and laterally dislocated with respect to the distal tibia. Tarsal degenerative changes are noted. Soft tissue swelling is seen. IMPRESSION: Trimalleolar fracture dislocation of the ankle with posterolateral dislocation of the talus with respect to the distal tibia. Electronically Signed   By: Oneil Devonshire M.D.   On: 10/25/2023 19:39   DG Chest Portable 1 View Result Date: 10/25/2023 CLINICAL DATA:  fall EXAM: PORTABLE CHEST - 1 VIEW COMPARISON:  May 29, 2022, May 04, 2022 FINDINGS: Low lung volumes. No focal airspace consolidation, pleural effusion, or pneumothorax. No cardiomegaly. Aortic atherosclerosis. No acute fracture or destructive lesions. Multilevel thoracic osteophytosis. IMPRESSION: No acute cardiopulmonary abnormality. Electronically Signed   By: Rogelia Myers M.D.   On: 10/25/2023 18:48   DG Ankle 2 Views Left Result Date: 10/25/2023 CLINICAL DATA:  fall EXAM: LEFT ANKLE - 2 VIEW COMPARISON:  None Available. FINDINGS: Tibia and fibula Osteopenia.No acute fracture or malalignment of the proximal tibia and fibula. Mild-to-moderate tricompartmental osteoarthritis of the knee. Soft tissues are unremarkable. Ankle Osteopenia.Fracture-dislocation of the ankle. Obliquely oriented, displaced fracture of the distal fibula above the level of the tibial plafond. The distal fibular fracture fragment is displaced  posteriorly approximately 5-6 mm. Posterior dislocation of the talus with respect to the tibia. A second fracture fragment along the posterior aspect of the tibia, may represent a posterior malleolus fracture with 5 mm of posterior displacement. Moderate soft tissue swelling about the ankle. IMPRESSION: 1. Bimalleolar fracture-dislocation of the ankle, as described above. 2. No acute fracture or malalignment of the proximal tibia and fibula. Electronically Signed   By: Rogelia Myers M.D.   On: 10/25/2023 18:42   DG  Tibia/Fibula Left Port Result Date: 10/25/2023 CLINICAL DATA:  [id number] Fall [id number] EXAM: PORTABLE LEFT TIBIA AND FIBULA - 2 VIEW COMPARISON:  None Available. FINDINGS: Tibia and fibula Osteopenia.No acute fracture or malalignment of the proximal tibia and fibula. Mild-to-moderate tricompartmental osteoarthritis of the knee. Soft tissues are unremarkable. Ankle Osteopenia.Fracture-dislocation of the ankle. Obliquely oriented, displaced fracture of the distal fibula above the level of the tibial plafond. The distal fibular fracture fragment is displaced posteriorly approximately 5-6 mm. Posterior dislocation of the talus with respect to the tibia. A second fracture fragment along the posterior aspect of the tibia, may represent a posterior malleolus fracture with 5 mm of posterior displacement. Moderate soft tissue swelling about the ankle. IMPRESSION: 1. Bimalleolar fracture dislocation of the ankle, as described above. 2. No acute fracture or malalignment of the proximal tibia and fibula. Electronically Signed   By: Rogelia Myers M.D.   On: 10/25/2023 18:42    Microbiology: Results for orders placed or performed during the hospital encounter of 10/25/23  Surgical pcr screen     Status: None   Collection Time: 10/26/23  9:13 AM   Specimen: Nasal Mucosa; Nasal Swab  Result Value Ref Range Status   MRSA, PCR NEGATIVE NEGATIVE Final   Staphylococcus aureus NEGATIVE NEGATIVE Final    Comment: (NOTE) The Xpert SA Assay (FDA approved for NASAL specimens in patients 55 years of age and older), is one component of a comprehensive surveillance program. It is not intended to diagnose infection nor to guide or monitor treatment. Performed at [facility], [address] [place], [place] [address]     Labs: CBC: Recent Labs  Lab 10/25/23 1758 10/26/23 0530 10/27/23 0354 10/28/23 0227 10/29/23 0434  WBC 9.9 11.2* 9.9 8.9 6.8  NEUTROABS 7.8*  --  6.2 5.2 3.5  HGB 13.4 11.1* 12.9 12.1 12.5   HCT 41.6 35.7* 40.1 37.7 38.4  MCV 87.9 98.6 88.3 88.3 87.3  PLT 234 250 235 226 276   Basic Metabolic Panel: Recent Labs  Lab 10/25/23 1758 10/26/23 0734 10/27/23 0354 10/28/23 0227 10/29/23 0434  NA 138 140 140 139 137  K 4.2 4.1 3.9 4.1 3.6  CL 102 105 104 104 103  CO2 21* 21* 20* 23 22  GLUCOSE 167* 124* 118* 136* 121*  BUN 11 12 23 18 17   CREATININE 1.10* 1.11* 1.47* 1.29* 1.01*  CALCIUM  9.8 9.6 9.0 9.0 8.6*  MG  --   --  2.1  --   --   PHOS  --   --  4.1  --   --    Liver Function Tests: Recent Labs  Lab 10/26/23 0734 10/27/23 0354  AST 17 17  ALT 12 12  ALKPHOS 93 88  BILITOT 1.0 0.9  PROT 7.4 6.6  ALBUMIN 3.8 3.4*   CBG: Recent Labs  Lab 10/28/23 0613 10/28/23 1128 10/28/23 1616 10/28/23 2138 10/29/23 0638  GLUCAP 152* 119* 180* 131* 139*    Discharge time spent: greater than 30 minutes.  Signed: Lebron JINNY Cage, MD [facility] 10/29/2023

## 2023-11-18 ENCOUNTER — Other Ambulatory Visit: Payer: Self-pay | Admitting: Family Medicine

## 2023-11-18 NOTE — Telephone Encounter (Unsigned)
 Copied from CRM #[id number]. Topic: Clinical - Medication Refill >> Nov 18, 2023  9:44 AM Carlyon D wrote: Medication: HYDROcodone -acetaminophen  (NORCO/VICODIN) 5-325 MG tablet  Has the patient contacted their pharmacy? No (Agent: If no, request that the patient contact the pharmacy for the refill. If patient does not wish to contact the pharmacy document the reason why and proceed with request.) (Agent: If yes, when and what did the pharmacy advise?)  This is the patient's preferred pharmacy:   [facility] [address], [place] - [address]  [place] [address] Phone: [phone] Fax: [phone]   Is this the correct pharmacy for this prescription? Yes If no, delete pharmacy and type the correct one.   Has the prescription been filled recently? Yes  Is the patient out of the medication? Yes  Has the patient been seen for an appointment in the last year OR does the patient have an upcoming appointment? Yes   Can we respond through MyChart? No, phone call please  Agent: Please be advised that Rx refills may take up to 3 business days. We ask that you follow-up with your pharmacy.

## 2023-11-19 MED ORDER — HYDROCODONE-ACETAMINOPHEN 5-325 MG PO TABS: 1.0000 | ORAL_TABLET | Freq: Three times a day (TID) | ORAL | 0 refills | Status: DC | PRN

## 2023-11-26 ENCOUNTER — Telehealth: Payer: Self-pay

## 2023-11-26 NOTE — Transitions of Care (Post Inpatient/ED Visit) (Signed)
   11/26/2023  Name: Casey [name] MRN: [id number] DOB: 09-May-1935  Today's TOC FU Call Status: Today's TOC FU Call Status:: Unsuccessful Call (1st Attempt) Unsuccessful Call (1st Attempt) Date: 11/26/23  Attempted to reach the patient regarding the most recent Inpatient/ED visit.  Follow Up Plan: Additional outreach attempts will be made to reach the patient to complete the Transitions of Care (Post Inpatient/ED visit) call.   Signature Julian Lemmings, LPN [facility] Nurse Health Advisor Direct Dial [phone]

## 2023-11-29 NOTE — Transitions of Care (Post Inpatient/ED Visit) (Unsigned)
   11/29/2023  Name: Casey [name] MRN: [id number] DOB: 05/30/1935  Today's TOC FU Call Status: Today's TOC FU Call Status:: Unsuccessful Call (2nd Attempt) Unsuccessful Call (1st Attempt) Date: 11/26/23 Unsuccessful Call (2nd Attempt) Date: 11/29/23  Attempted to reach the patient regarding the most recent Inpatient/ED visit.  Follow Up Plan: Additional outreach attempts will be made to reach the patient to complete the Transitions of Care (Post Inpatient/ED visit) call.   Signature Julian Lemmings, LPN [facility] Nurse Health Advisor Direct Dial [phone]

## 2023-11-30 NOTE — Transitions of Care (Post Inpatient/ED Visit) (Signed)
   11/30/2023  Name: FONTELLA SHAN MRN: [id number] DOB: 1935/04/06  Today's TOC FU Call Status: Today's TOC FU Call Status:: Unsuccessful Call (3rd Attempt) Unsuccessful Call (1st Attempt) Date: 11/26/23 Unsuccessful Call (2nd Attempt) Date: 11/29/23 Unsuccessful Call (3rd Attempt) Date: 11/30/23  Attempted to reach the patient regarding the most recent Inpatient/ED visit.  Follow Up Plan: No further outreach attempts will be made at this time. We have been unable to contact the patient.  Signature Julian Lemmings, LPN [facility] Nurse Health Advisor Direct Dial [phone]

## 2023-12-01 ENCOUNTER — Telehealth: Payer: Self-pay

## 2023-12-01 NOTE — Telephone Encounter (Signed)
 Copied from CRM [id number]. Topic: Clinical - Prescription Issue >> Dec 01, 2023  1:53 PM Nessti S wrote: Reason for CRM: rosina wants to speak with nurse concerning pt meds. call back number [phone]

## 2023-12-02 ENCOUNTER — Inpatient Hospital Stay: Admitting: Family Medicine

## 2023-12-03 ENCOUNTER — Ambulatory Visit: Payer: Self-pay

## 2023-12-03 NOTE — Telephone Encounter (Addendum)
 Called and attempted to reach patient but her phone requested an access code. When one was not given the call was disconnected. Wanted to confirm and get a verbal to speak with Rosina.  I did call Rosina and she expressed that she no longer had questions due to her seeing Ms. Goodnow yesterday. Rosina did disclose that patient was discharged from the skilled facility and is currently staying with her brother in [place] temporarily. Rosina expressed that patient changed her pharmacy to a pharmacy near her brother and will probably call the office to request refills due to the rehab facility taking over her medications during her time in the rehab. Rosina did express that she would call our office back with any other updates or further questions or concerns. Did request to have patient update DRP or give a verbal to speak with her to provide information to nurse.

## 2023-12-03 NOTE — Telephone Encounter (Signed)
 This RN attempted to contact patient for triage. No answer, unable to leave voicemail left requesting return call to clinic.  2nd attempt

## 2023-12-03 NOTE — Telephone Encounter (Signed)
 Attempted to contact patient for triage, unable to leave voicemail at this time. First Attempt.

## 2023-12-03 NOTE — Telephone Encounter (Signed)
 This RN attempted to contact, 3rd attempt, no answer, unable to leave voicemail, vm not setup.

## 2023-12-07 ENCOUNTER — Other Ambulatory Visit: Payer: Self-pay | Admitting: Family Medicine

## 2023-12-27 ENCOUNTER — Telehealth: Payer: Self-pay | Admitting: Family Medicine

## 2023-12-27 NOTE — Telephone Encounter (Unsigned)
 Copied from CRM #[id number]. Topic: Clinical - Medication Refill >> Dec 27, 2023  3:10 PM Shanda MATSU wrote: Medication: HYDROcodone -acetaminophen  (NORCO/VICODIN) 5-325 MG tablet  Has the patient contacted their pharmacy? No (Agent: If no, request that the patient contact the pharmacy for the refill. If patient does not wish to contact the pharmacy document the reason why and proceed with request.) (Agent: If yes, when and what did the pharmacy advise?)  This is the patient's preferred pharmacy:  [facility] [address], [place] - [address] [place] [address] Phone: [phone] Fax: [phone]   Is this the correct pharmacy for this prescription? Yes If no, delete pharmacy and type the correct one.   Has the prescription been filled recently? No  Is the patient out of the medication? Yes  Has the patient been seen for an appointment in the last year OR does the patient have an upcoming appointment? Yes  Can we respond through MyChart? Yes  Agent: Please be advised that Rx refills may take up to 3 business days. We ask that you follow-up with your pharmacy.

## 2023-12-29 MED ORDER — HYDROCODONE-ACETAMINOPHEN 5-325 MG PO TABS: 1.0000 | ORAL_TABLET | Freq: Three times a day (TID) | ORAL | 0 refills | Status: DC | PRN

## 2023-12-31 ENCOUNTER — Ambulatory Visit: Admitting: Family Medicine

## 2023-12-31 ENCOUNTER — Encounter: Payer: Self-pay | Admitting: Family Medicine

## 2023-12-31 VITALS — BP 138/78 | HR 86 | Temp 97.2°F | Ht 62.0 in

## 2023-12-31 DIAGNOSIS — E1159 Type 2 diabetes mellitus with other circulatory complications: Secondary | ICD-10-CM

## 2023-12-31 DIAGNOSIS — E1129 Type 2 diabetes mellitus with other diabetic kidney complication: Secondary | ICD-10-CM

## 2023-12-31 DIAGNOSIS — G8929 Other chronic pain: Secondary | ICD-10-CM

## 2023-12-31 LAB — POCT GLYCOSYLATED HEMOGLOBIN (HGB A1C): Hemoglobin A1C: 7.6 % — AB (ref 4.0–5.6)

## 2023-12-31 MED ORDER — BLOOD PRESSURE KIT: PACK | 0 refills | Status: AC

## 2023-12-31 MED ORDER — BLOOD GLUCOSE TEST VI STRP: 1.0000 | ORAL_STRIP | 0 refills | Status: AC

## 2023-12-31 MED ORDER — BLOOD GLUCOSE MONITORING SUPPL DEVI: 1.0000 | 0 refills | Status: AC

## 2023-12-31 MED ORDER — LANCETS MISC: 1.0000 | 0 refills | Status: AC

## 2023-12-31 MED ORDER — LANCET DEVICE MISC: 1.0000 | 0 refills | Status: AC

## 2023-12-31 NOTE — Assessment & Plan Note (Addendum)
 Blood pressure initially elevated today however at goal on recheck.  She will continue her amlodipine  5 mg daily and monitor at home.  Recheck in 3 months.

## 2023-12-31 NOTE — Assessment & Plan Note (Signed)
 On Norco 5-3 25 every 8 hours as needed.  She tolerates medication well.  She did recently have an ankle fracture and pain has been controlled with her Norco.  Does not need refill of medications today.  She has been tolerating well without any significant side effects.

## 2023-12-31 NOTE — Progress Notes (Signed)
 Casey [name] is a 88 y.o. female who presents today for an office visit.  Assessment/Plan:  New/Acute Problems: Left Ankle Fracture  Patient was hospitalized 2 months ago after sustaining mechanical fall at home which resulted in left ankle fracture.  She underwent open reduction internal fixation and was at rehab for the last month.  She is with her brother now but is planning on going back home to soon.  She is getting home health support currently and will be following up with orthopedics in a couple of weeks.  He will let us  know if she needs any further assistance with this.  Chronic Problems Addressed Today: Type 2 diabetes mellitus with microalbuminuria, without long-term current use of insulin  (HCC) A1c stable 7.6.  Her home glucometer is no longer working.  Will send a new prescription in for glucometer.  She will continue her Jardiance  25 mg daily and Januvia  100 mg daily.  Recheck A1c in 3 months.  Chronic pain On Norco 5-3 25 every 8 hours as needed.  She tolerates medication well.  She did recently have an ankle fracture and pain has been controlled with her Norco.  Does not need refill of medications today.  She has been tolerating well without any significant side effects.  Hypertension associated with diabetes (HCC) Blood pressure initially elevated today however at goal on recheck.  She will continue her amlodipine  5 mg daily and monitor at home.  Recheck in 3 months.     Subjective:  HPI:  See assessment / plan for status of chronic conditions.   Discussed the use of AI scribe software for clinical note transcription with the patient, who gave verbal consent to proceed.  History of Present Illness Casey [name] is an 88 year old female who presents for follow-up after hospitalization and rehabilitation for a left ankle fracture.  In October, she sustained a left ankle fracture while running to answer the phone and tripping over a rug. She was hospitalized and  subsequently stayed in a rehabilitation facility for a month. She has been home for about two weeks and is currently staying with her brother, with plans to return to her own home this weekend. She receives assistance at home two to three times a week. She was non-weight bearing on her left ankle for eight weeks and has recently started weight-bearing, which causes pain. She used a heavy boot for four weeks, which was removed by her orthopedist. A follow-up appointment with the orthopedist is scheduled for January 10, 2024.  Her diabetes management has been challenging, with blood sugar readings at home often over 200, although her A1c is 7.6. She suspects her glucometer is faulty due to frequent error messages. She has been on Jardiance  and Januvia , but during her rehabilitation stay, she did not receive all her medications consistently, potentially affecting her blood sugar levels.  She reports a loss of appetite since her ankle injury, impacting her dietary intake. She did not eat much during her rehabilitation stay but hopes her appetite will improve now that she is back home.  She did not take her blood pressure medication today and lacks a reliable way to check her blood pressure at home.         Objective:  Physical Exam: BP 138/78   Pulse 86   Temp (!) 97.2 F (36.2 C) (Temporal)   Ht 5' 2 (1.575 m)   SpO2 96%   BMI 29.26 kg/m   Gen: No acute distress, resting comfortably  CV: Regular rate and rhythm with no murmurs appreciated Pulm: Normal work of breathing, clear to auscultation bilaterally with no crackles, wheezes, or rhonchi Neuro: Grossly normal, moves all extremities Psych: Normal affect and thought content       Kristinia Leavy M. Kennyth, MD 12/31/2023 11:03 AM

## 2023-12-31 NOTE — Patient Instructions (Signed)
 It was very nice to see you today!  VISIT SUMMARY: You came in for a follow-up after your recent hospitalization and rehabilitation for a left ankle fracture. We discussed your diabetes management, your healing ankle, and general health maintenance.  YOUR PLAN: TYPE 2 DIABETES MELLITUS WITH MICROALBUMINURIA AND CIRCULATORY COMPLICATIONS: Your blood sugar levels have been high, possibly due to a faulty glucometer. Your A1c level is 7.6, which indicates good control. -Continue taking your current diabetes medications, Jardiance  and Januvia . -A new glucometer has been sent to your pharmacy.  LEFT ANKLE FRACTURE, HEALING: Your left ankle is healing, but you are experiencing pain when putting weight on it. -Continue to follow up with your orthopedist on December 22nd.  GENERAL HEALTH MAINTENANCE: You received a flu shot today and do not have a way to check your blood pressure at home. -A blood pressure monitoring kit has been sent to your pharmacy.  Return in about 3 months (around 03/30/2024) for Follow Up.   Take care, Dr Kennyth  PLEASE NOTE:  If you had any lab tests, please let us  know if you have not heard back within a few days. You may see your results on mychart before we have a chance to review them but we will give you a call once they are reviewed by us .   If we ordered any referrals today, please let us  know if you have not heard from their office within the next week.   If you had any urgent prescriptions sent in today, please check with the pharmacy within an hour of our visit to make sure the prescription was transmitted appropriately.   Please try these tips to maintain a healthy lifestyle:  Eat at least 3 REAL meals and 1-2 snacks per day.  Aim for no more than 5 hours between eating.  If you eat breakfast, please do so within one hour of getting up.   Each meal should contain half fruits/vegetables, one quarter protein, and one quarter carbs (no bigger than a computer  mouse)  Cut down on sweet beverages. This includes juice, soda, and sweet tea.   Drink at least 1 glass of water  with each meal and aim for at least 8 glasses per day  Exercise at least 150 minutes every week.

## 2023-12-31 NOTE — Assessment & Plan Note (Signed)
 A1c stable 7.6.  Her home glucometer is no longer working.  Will send a new prescription in for glucometer.  She will continue her Jardiance  25 mg daily and Januvia  100 mg daily.  Recheck A1c in 3 months.

## 2024-01-26 ENCOUNTER — Telehealth: Payer: Self-pay | Admitting: *Deleted

## 2024-01-26 NOTE — Telephone Encounter (Signed)
 Copied from CRM [id number]. Topic: Clinical - Home Health Verbal Orders >> Jan 26, 2024  2:27 PM Berneda FALCON wrote: Caller/Agency: Randall, PT with [facility] Callback Number: [phone] (Office number,  PT has not actually been able to see her) Service Requested: Occupational Therapy, Physical Therapy, and Skilled Nursing Frequency: Requesting discharge due to non-visit, they have been unable to reach her. Her last visit was 12/29/2023 Any new concerns about the patient? No, this particular PT has been unable to reach the patient, so she has never personally seen her. She was trying to do an appt with her today, but no one can reach the patient. We may want to reach out to make sure she is okay.   Ok to give VO? Goran Olden,RMA

## 2024-01-27 NOTE — Telephone Encounter (Signed)
 That is ok. Please try to contact patient to make sure she is ok.

## 2024-01-27 NOTE — Telephone Encounter (Signed)
 No voice mail.. unable to contact patient x 2

## 2024-01-31 ENCOUNTER — Telehealth: Payer: Self-pay | Admitting: Family Medicine

## 2024-01-31 NOTE — Telephone Encounter (Unsigned)
 Copied from CRM #[id number]. Topic: Clinical - Medication Refill >> Jan 31, 2024 12:00 PM China J wrote: Medication: HYDROcodone -acetaminophen  (NORCO/VICODIN) 5-325 MG tablet  Has the patient contacted their pharmacy? Yes (Agent: If no, request that the patient contact the pharmacy for the refill. If patient does not wish to contact the pharmacy document the reason why and proceed with request.) (Agent: If yes, when and what did the pharmacy advise?)  This is the patient's preferred pharmacy:  [facility] [address], [place] - [address] [place] [address] Phone: [phone] Fax: [phone]  Is this the correct pharmacy for this prescription? Yes If no, delete pharmacy and type the correct one.   Has the prescription been filled recently? No  Is the patient out of the medication? Yes  Has the patient been seen for an appointment in the last year OR does the patient have an upcoming appointment? Yes  Can we respond through MyChart? No  Agent: Please be advised that Rx refills may take up to 3 business days. We ask that you follow-up with your pharmacy.

## 2024-01-31 NOTE — Telephone Encounter (Signed)
 Unable to pend requested medication Norco

## 2024-02-01 MED ORDER — HYDROCODONE-ACETAMINOPHEN 5-325 MG PO TABS: 1.0000 | ORAL_TABLET | Freq: Three times a day (TID) | ORAL | 0 refills | Status: AC | PRN

## 2024-02-16 ENCOUNTER — Other Ambulatory Visit: Payer: Self-pay | Admitting: *Deleted

## 2024-02-16 ENCOUNTER — Telehealth: Payer: Self-pay | Admitting: *Deleted

## 2024-02-16 MED ORDER — GABAPENTIN 300 MG PO CAPS: 300.0000 mg | ORAL_CAPSULE | Freq: Every day | ORAL | 2 refills | Status: AC

## 2024-02-16 NOTE — Telephone Encounter (Signed)
 Copied from CRM [id number]. Topic: Clinical - Prescription Issue >> Feb 16, 2024 12:06 PM Jasmin G wrote: Reason for CRM: Pt requested for her gabapentin  (NEURONTIN ) 300 MG capsule to be sent for refill today if possible, as she's out of med.  Rx refill send to patient pharmacy  [facility]

## 2024-03-30 ENCOUNTER — Ambulatory Visit: Admitting: Family Medicine

## 2024-05-03 ENCOUNTER — Ambulatory Visit

## 2024-05-04 ENCOUNTER — Ambulatory Visit
